# Patient Record
Sex: Female | Born: 1955 | Race: Black or African American | Hispanic: No | Smoking: Never smoker
Health system: Southern US, Community
[De-identification: ages and names within clinical notes are randomized; demographics above are authoritative.]

## PROBLEM LIST (undated history)

## (undated) DIAGNOSIS — R7303 Prediabetes: Secondary | ICD-10-CM

## (undated) DIAGNOSIS — I5032 Chronic diastolic (congestive) heart failure: Secondary | ICD-10-CM

## (undated) DIAGNOSIS — H8110 Benign paroxysmal vertigo, unspecified ear: Secondary | ICD-10-CM

## (undated) DIAGNOSIS — F419 Anxiety disorder, unspecified: Secondary | ICD-10-CM

## (undated) DIAGNOSIS — K589 Irritable bowel syndrome without diarrhea: Secondary | ICD-10-CM

## (undated) DIAGNOSIS — F32A Depression, unspecified: Secondary | ICD-10-CM

## (undated) DIAGNOSIS — E559 Vitamin D deficiency, unspecified: Secondary | ICD-10-CM

## (undated) DIAGNOSIS — I480 Paroxysmal atrial fibrillation: Secondary | ICD-10-CM

## (undated) DIAGNOSIS — F329 Major depressive disorder, single episode, unspecified: Secondary | ICD-10-CM

## (undated) DIAGNOSIS — E119 Type 2 diabetes mellitus without complications: Secondary | ICD-10-CM

## (undated) DIAGNOSIS — T8859XA Other complications of anesthesia, initial encounter: Secondary | ICD-10-CM

## (undated) DIAGNOSIS — M1990 Unspecified osteoarthritis, unspecified site: Secondary | ICD-10-CM

## (undated) DIAGNOSIS — Z7901 Long term (current) use of anticoagulants: Secondary | ICD-10-CM

## (undated) DIAGNOSIS — I10 Essential (primary) hypertension: Secondary | ICD-10-CM

## (undated) DIAGNOSIS — I639 Cerebral infarction, unspecified: Secondary | ICD-10-CM

## (undated) DIAGNOSIS — E669 Obesity, unspecified: Secondary | ICD-10-CM

## (undated) DIAGNOSIS — I4891 Unspecified atrial fibrillation: Secondary | ICD-10-CM

## (undated) DIAGNOSIS — E739 Lactose intolerance, unspecified: Secondary | ICD-10-CM

## (undated) DIAGNOSIS — R42 Dizziness and giddiness: Secondary | ICD-10-CM

## (undated) DIAGNOSIS — G4730 Sleep apnea, unspecified: Secondary | ICD-10-CM

## (undated) DIAGNOSIS — J329 Chronic sinusitis, unspecified: Secondary | ICD-10-CM

## (undated) DIAGNOSIS — M549 Dorsalgia, unspecified: Secondary | ICD-10-CM

## (undated) DIAGNOSIS — I509 Heart failure, unspecified: Secondary | ICD-10-CM

## (undated) DIAGNOSIS — R600 Localized edema: Secondary | ICD-10-CM

## (undated) DIAGNOSIS — K219 Gastro-esophageal reflux disease without esophagitis: Secondary | ICD-10-CM

## (undated) DIAGNOSIS — Z6841 Body Mass Index (BMI) 40.0 and over, adult: Secondary | ICD-10-CM

## (undated) HISTORY — DX: Heart failure, unspecified: I50.9

## (undated) HISTORY — PX: GASTRIC BYPASS: SHX52

## (undated) HISTORY — DX: Morbid (severe) obesity due to excess calories: E66.01

## (undated) HISTORY — PX: TOTAL KNEE ARTHROPLASTY: SHX125

## (undated) HISTORY — DX: Chronic sinusitis, unspecified: J32.9

## (undated) HISTORY — DX: Unspecified osteoarthritis, unspecified site: M19.90

## (undated) HISTORY — DX: Cerebral infarction, unspecified: I63.9

## (undated) HISTORY — DX: Sleep apnea, unspecified: G47.30

## (undated) HISTORY — DX: Gastro-esophageal reflux disease without esophagitis: K21.9

## (undated) HISTORY — DX: Vitamin D deficiency, unspecified: E55.9

## (undated) HISTORY — DX: Obesity, unspecified: E66.9

## (undated) HISTORY — DX: Localized edema: R60.0

## (undated) HISTORY — PX: ABDOMINAL HYSTERECTOMY: SHX81

## (undated) HISTORY — DX: Depression, unspecified: F32.A

## (undated) HISTORY — DX: Lactose intolerance, unspecified: E73.9

## (undated) HISTORY — DX: Essential (primary) hypertension: I10

## (undated) HISTORY — DX: Irritable bowel syndrome, unspecified: K58.9

## (undated) HISTORY — PX: BREAST EXCISIONAL BIOPSY: SUR124

## (undated) HISTORY — PX: ENDOMETRIAL ABLATION: SHX621

## (undated) HISTORY — PX: OTHER SURGICAL HISTORY: SHX169

## (undated) HISTORY — DX: Major depressive disorder, single episode, unspecified: F32.9

## (undated) HISTORY — DX: Dorsalgia, unspecified: M54.9

## (undated) HISTORY — DX: Body Mass Index (BMI) 40.0 and over, adult: Z684

## (undated) HISTORY — DX: Prediabetes: R73.03

## (undated) HISTORY — DX: Dizziness and giddiness: R42

---

## 2004-03-27 ENCOUNTER — Ambulatory Visit: Payer: Self-pay | Admitting: Family Medicine

## 2005-05-26 ENCOUNTER — Observation Stay (HOSPITAL_COMMUNITY): Admission: RE | Admit: 2005-05-26 | Discharge: 2005-05-27 | Payer: Self-pay | Admitting: Gynecology

## 2005-05-26 ENCOUNTER — Encounter (INDEPENDENT_AMBULATORY_CARE_PROVIDER_SITE_OTHER): Payer: Self-pay | Admitting: Specialist

## 2005-10-09 ENCOUNTER — Ambulatory Visit: Payer: Self-pay | Admitting: Orthopaedic Surgery

## 2005-10-27 ENCOUNTER — Ambulatory Visit: Payer: Self-pay | Admitting: Gynecology

## 2007-01-25 ENCOUNTER — Ambulatory Visit: Payer: Self-pay | Admitting: Gynecology

## 2007-02-24 ENCOUNTER — Encounter: Admission: RE | Admit: 2007-02-24 | Discharge: 2007-02-24 | Payer: Self-pay | Admitting: Gynecology

## 2007-03-04 ENCOUNTER — Encounter: Admission: RE | Admit: 2007-03-04 | Discharge: 2007-03-04 | Payer: Self-pay | Admitting: Gynecology

## 2007-09-16 ENCOUNTER — Encounter: Admission: RE | Admit: 2007-09-16 | Discharge: 2007-09-16 | Payer: Self-pay | Admitting: Gynecology

## 2008-03-01 ENCOUNTER — Encounter: Admission: RE | Admit: 2008-03-01 | Discharge: 2008-03-01 | Payer: Self-pay | Admitting: Obstetrics and Gynecology

## 2008-06-26 ENCOUNTER — Ambulatory Visit: Payer: Self-pay | Admitting: Family Medicine

## 2008-07-17 ENCOUNTER — Encounter: Payer: Self-pay | Admitting: Family Medicine

## 2008-07-20 ENCOUNTER — Encounter: Payer: Self-pay | Admitting: Family Medicine

## 2008-08-19 ENCOUNTER — Encounter: Payer: Self-pay | Admitting: Family Medicine

## 2008-09-19 ENCOUNTER — Encounter: Payer: Self-pay | Admitting: Family Medicine

## 2009-01-20 ENCOUNTER — Ambulatory Visit: Payer: Self-pay | Admitting: Family Medicine

## 2009-04-06 ENCOUNTER — Encounter: Admission: RE | Admit: 2009-04-06 | Discharge: 2009-04-06 | Payer: Self-pay | Admitting: Family Medicine

## 2010-04-12 ENCOUNTER — Encounter
Admission: RE | Admit: 2010-04-12 | Discharge: 2010-04-12 | Payer: Self-pay | Source: Home / Self Care | Attending: Family Medicine | Admitting: Family Medicine

## 2010-05-12 ENCOUNTER — Encounter: Payer: Self-pay | Admitting: Gynecology

## 2010-09-06 NOTE — H&P (Signed)
NAME:  Regina Christensen, Regina Christensen NO.:  [id number]   MEDICAL RECORD NO.:  [id number]          PATIENT TYPE:  INP   LOCATION:  NA                            FACILITY:  [facility]   PHYSICIAN:  Ginger Carne, MD  DATE OF BIRTH:  Jun 13, 1955   DATE OF ADMISSION:  DATE OF DISCHARGE:                                HISTORY & PHYSICAL   DATE OF SURGERY:  Scheduled for Monday, May 26, 2005.   REASON FOR HOSPITALIZATION:  Menometrorrhagia.   IN HOSPITAL PROCEDURES:  A total vaginal hysterectomy with preservation of  both tubes and ovaries.   HISTORY OF PRESENT ILLNESS:  This patient is a 55 year old gravida 2, para 2-  0-0-2, African-American female admitted for aforementioned diagnoses because  of a long-standing history of over the past five years of menses lasting  between 10 and 15 days out of the 30 day cycle.  She had undergone in the  end of 2002 an endomyometrial resection followed six months later by an  endometrial thermal balloon ablation for a conservative management of said  bleeding.  At that time, both procedures did not sufficiently curb her  bleeding and spotting throughout the month.  The pathology reports on both  cases revealed no evidence of neoplasia or hyperplasia.  A transvaginal  ultrasound obtained in the office with an SIS revealed no evidence  intracavitary lesions.  Myometrium revealed no evidence of leiomyoma.  Due  to the patient's hypertensive state, she is not a candidate for oral  contraceptive management.   OBSTETRICAL/GYNECOLOGICAL HISTORY:  The patient has had two vaginal  deliveries in the past.  Method of contraception, a bilateral tubal  ligation.  Endomyometrial resection in December of 2002 and endometrial  thermal balloon ablation in May of 2003.   MEDICAL HISTORY:  1.  Hypertension.  2.  Depression.   SURGICAL HISTORY:  Per obstetrical/gynecological section.   MEDICATIONS:  1.  Lotrel 10/20 mg daily.  2.  Effexor 150 mg daily.  3.  Allegra 120 mg as needed twice a day.  4.  Naprosyn 500 mg as needed daily.  5.  Triamterene/hydrochlorothiazide 75/50 mg one daily.  6.  Lorazepam 0.5 mg as needed up to three times a day.   ALLERGIES:  NONE.   SOCIAL HISTORY:  The patient denies smoking, drug or alcohol abuse.   FAMILY HISTORY:  Noncontributory.   REVIEW OF SYSTEMS:  A 10-point comprehensive review of systems is  unremarkable.   PHYSICAL EXAMINATION:  VITAL SIGNS:  Height 5 feet 8 inches, weight 263  pounds, pulse 98 and regular, blood pressure is 149/92.  HEENT:  Grossly normal.  BREAST EXAM:  Without mass or discharge, thickenings or tenderness.  CHEST:  Clear to percussion and auscultation.  CARDIOVASCULAR EXAM:  Without murmurs or enlargements, regular rate and  rhythm.  VASCULAR, EXTREMITIES, LYMPHATICS, SKIN, NEUROLOGICAL SYSTEMS AND  MUSCULOSKELETAL SYSTEMS:  Normal.  ABDOMEN:  Soft without gross hepatosplenomegaly.  PELVIC EXAM:  External genitalia, vulva and vagina normal.  The cervix is  smooth without erosions or lesions.  Normal Pap smear.  The uterus is six  weeks  in size and globular.  Both adnexa are palpable and found to be  normal.  RECTAL EXAM:  Hemoccult negative without masses.   IMPRESSION:  Menometrorrhagia with failure of two conservative procedures  for control of bleeding.   PLAN:  The patient has no desire for further childbearing and considers said  conservative management unsuccessful.  The patient desires to have no  further bleeding.  She is not a candidate for hormonal management due to her  hypertensive status and being over 35.  The patient has agreed and will  undergo a total vaginal hysterectomy with preservation of both tubes and  ovaries.  Ashby Dawes of said procedure discussed in detail.  The risk including  possible injuries to ureter, bowel and bladder, possible conversion to  laparoscopic or open procedure, hemorrhage possibly requiring a blood  transfusion,  infection, pulmonary complications, and other unforeseen  complications discussed and understood by said patient.  The patient will  undergo said procedure on May 26, 2005.      Ginger Carne, MD  Electronically Signed     SHB/MEDQ  D:  05/22/2005  T:  05/22/2005  Job:  [id number]

## 2010-09-06 NOTE — Discharge Summary (Signed)
NAME:  Regina Christensen, PEPPEL NO.:  [id number]   MEDICAL RECORD NO.:  [id number]          PATIENT TYPE:  INP   LOCATION:  9310                          [facility]ACILITY:  [facility]   PHYSICIAN:  Ginger Carne, MD  DATE OF BIRTH:  December 25, 1955   DATE OF ADMISSION:  05/26/2005  DATE OF DISCHARGE:  05/27/2005                                 DISCHARGE SUMMARY   REASON FOR HOSPITALIZATION:  Menometrorrhagia, leiomyomatous uterus.   IN-HOSPITAL PROCEDURES:  Total vaginal hysterectomy.   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   HOSPITAL COURSE:  This patient is a 54 year old African-American female who  underwent a total vaginal hysterectomy with preservation of both tubes and  ovaries on May 26, 2005.  Intraoperative course was uneventful.  Her  uterus weighed 368 g and was 14 weeks in size with multiple leiomyoma, the  largest being 4 cm.  Postoperatively she did well.  She was afebrile, voided  well.  H&H 35.5 and 11.9.  She had scant vaginal flow.  Abdomen soft,  minimal tenderness.  Calves without tenderness.  Lungs were clear.   Patient was discharged with routine postoperative instructions including  contacting the office for temperature elevation above 100.4 degrees  Fahrenheit, increasing abdominal pain, increased vaginal flow, genitourinary  or gastrointestinal symptoms.  Patient was prescribed Percocet 5/325 one to  two every four to six hours.  She will return to the office in two weeks.      Ginger Carne, MD  Electronically Signed     SHB/MEDQ  D:  05/27/2005  T:  05/27/2005  Job:  [id number]

## 2010-09-06 NOTE — Op Note (Signed)
NAME:  RHILEY, TARVER NO.:  [id number]   MEDICAL RECORD NO.:  [id number]          PATIENT TYPE:  INP   LOCATION:  9399                          [facility]ILITY:  [facility]   PHYSICIAN:  Ginger Carne, MD  DATE OF BIRTH:  02/07/1956   DATE OF PROCEDURE:  05/26/2005  DATE OF DISCHARGE:                                 OPERATIVE REPORT   PREOPERATIVE DIAGNOSIS:  Menometrorrhagia, leiomyomatous uterus.   POSTOPERATIVE DIAGNOSIS:  Menometrorrhagia, leiomyomatous uterus.  14-week  leiomyomatous uterus.   PROCEDURE:  Total vaginal hysterectomy with preservation of both tubes and  ovaries.   SURGEON:  Ginger Carne, M.D.   ASSISTANT:  None.   COMPLICATIONS:  None immediate.   ESTIMATED BLOOD LOSS:  125 mL.   SPECIMEN:  Uterus weighing 268 grams.   ANESTHESIA:  General.   OPERATIVE FINDINGS:  External genitalia, vulva, vagina normal. Cervix smooth  without erosions or lesions. Evaluation of the uterus revealed multiple  leiomyoma with maximum dimension of 4-5 cm, measuring through 268 grams and  about 14 weeks size. Both ovaries appeared normal.   OPERATIVE PROCEDURE:  The patient prepped and draped in usual fashion and  placed in lithotomy position. Betadine solution used for antiseptic. The  patient was catheterized prior to the procedure. After adequate general  anesthesia double tooth tenaculum placed on the anterior and posterior lips  of the cervix. Marcaine with epinephrine was injected as a paracervical  block around the cervix.  2 cm of anterior and posterior vaginal epithelium  were incised transversely. This was then followed by dissection of the  peritoneal reflections and opening said reflections without injury to the  respective organs. Following this uterosacral cardinal ligament complexes  were clamped, cut and ligated with 0 Vicryl suture. This extended to the  uterine vasculature, ascending branches and broad ligaments. At this point  the  utero-ovarian ligaments were clamped, cut and ligated with transfixation  0 Vicryl sutures twice.  Bleeding points hemostatically checked. Blood clots removed. Closure of the  cuff in one layer with 0 Vicryl running interlocking suture. The patient  tolerated the procedure well, returned to post anesthesia recovery room in  excellent condition.      Ginger Carne, MD  Electronically Signed     SHB/MEDQ  D:  05/26/2005  T:  05/27/2005  Job:  [id number]

## 2010-10-09 ENCOUNTER — Ambulatory Visit: Payer: Self-pay | Admitting: Family Medicine

## 2011-03-14 ENCOUNTER — Other Ambulatory Visit: Payer: Self-pay | Admitting: Family Medicine

## 2011-03-14 ENCOUNTER — Other Ambulatory Visit: Payer: Self-pay | Admitting: *Deleted

## 2011-03-14 DIAGNOSIS — Z1231 Encounter for screening mammogram for malignant neoplasm of breast: Secondary | ICD-10-CM

## 2011-04-08 ENCOUNTER — Ambulatory Visit: Payer: Self-pay | Admitting: Unknown Physician Specialty

## 2011-04-18 ENCOUNTER — Ambulatory Visit: Payer: Self-pay

## 2011-05-09 ENCOUNTER — Other Ambulatory Visit: Payer: Self-pay | Admitting: Family Medicine

## 2011-05-09 ENCOUNTER — Ambulatory Visit
Admission: RE | Admit: 2011-05-09 | Discharge: 2011-05-09 | Disposition: A | Discharge status: Home / Self Care | Payer: Self-pay | Source: Ambulatory Visit | Attending: *Deleted | Admitting: *Deleted

## 2011-05-09 ENCOUNTER — Other Ambulatory Visit: Payer: Self-pay | Admitting: *Deleted

## 2011-05-09 DIAGNOSIS — Z1231 Encounter for screening mammogram for malignant neoplasm of breast: Secondary | ICD-10-CM

## 2011-11-14 ENCOUNTER — Ambulatory Visit (INDEPENDENT_AMBULATORY_CARE_PROVIDER_SITE_OTHER): Payer: BC Managed Care – PPO | Admitting: Obstetrics & Gynecology

## 2011-11-14 ENCOUNTER — Encounter: Payer: Self-pay | Admitting: Obstetrics & Gynecology

## 2011-11-14 VITALS — BP 126/103 | HR 88 | Ht 68.0 in | Wt 309.0 lb

## 2011-11-14 DIAGNOSIS — Z0000 Encounter for general adult medical examination without abnormal findings: Secondary | ICD-10-CM
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
## 2011-11-14 NOTE — Progress Notes (Signed)
Yearly exam/  Going through the process to have gastric sleeve surgery.

## 2011-11-21 ENCOUNTER — Ambulatory Visit: Payer: Self-pay | Admitting: Bariatrics

## 2011-11-26 ENCOUNTER — Ambulatory Visit: Payer: Self-pay | Admitting: Bariatrics

## 2011-12-11 ENCOUNTER — Ambulatory Visit: Payer: Self-pay | Admitting: Bariatrics

## 2011-12-20 ENCOUNTER — Emergency Department: Payer: Self-pay | Admitting: *Deleted

## 2011-12-21 ENCOUNTER — Ambulatory Visit: Payer: Self-pay | Admitting: Bariatrics

## 2012-01-06 ENCOUNTER — Ambulatory Visit (INDEPENDENT_AMBULATORY_CARE_PROVIDER_SITE_OTHER): Payer: BC Managed Care – PPO | Admitting: Nurse Practitioner

## 2012-01-06 ENCOUNTER — Encounter: Payer: Self-pay | Admitting: Nurse Practitioner

## 2012-01-06 VITALS — BP 137/83 | HR 80 | Ht 68.0 in | Wt 285.0 lb

## 2012-01-06 DIAGNOSIS — N63 Unspecified lump in unspecified breast: Secondary | ICD-10-CM

## 2012-01-06 DIAGNOSIS — N6320 Unspecified lump in the left breast, unspecified quadrant: Secondary | ICD-10-CM

## 2012-01-06 NOTE — Patient Instructions (Signed)
Breast Problems and Self-Exam Completing monthly breast exams may pick up problems early and save lives. There can be numerous causes of swelling, tenderness or lumps in the breasts. Some of these causes are:   Fibrocystic breast syndrome (noncancerous lumps). This is the most common cause of lumps in the breast.   Fibroadenoma breast tumors of unknown cause. These are noncancerous (benign) lumps.   Benign fatty tumors (lipomas).   Cancer of the breast.  By doing monthly breast exams, you get to know how your breasts feel and how they can change from month to month. This allows you to notice changes early. It can also offer you some reassurance that your breast health is good.  BREAST SELF-EXAM There are a few points to follow when doing a thorough breast exam. The best time to examine your breasts is 5 to 7 days after your menstrual period is over. During menstruation, the breasts are lumpier, and it may be more difficult to pick up changes. If you do not menstruate, have reached menopause, or had a hysterectomy (uterus removal), examine your breasts the first day of every month. After 3 to 4 months, you will become more familiar with the variations of your breasts and more comfortable with the exam.  Perform your breast exam monthly. Keep a written record with breast changes or normal findings for each breast. This makes it easier to be sure of changes, so you do not need to depend only on memory for size, tenderness, or location. Try to do the exam at the same time each month, and write down where you are in your menstrual cycle, if you are still menstruating.   Look at your breasts. Stand in front of a mirror with your hands clasped behind your head. Tighten your chest muscles and look for asymmetry. This means a difference in shape or contour from one breast to the other, such as puckers, dips or bumps. Also, look for skin changes.   Lean forward with your hands on your hips. Again, look for  symmetry and skin changes.   While showering, soap the breasts. Then, carefully feel the breasts with your fingertips, while holding the other arm (on the side of the breast you are examining) over your head. Do this with each breast, carefully feeling for lumps or changes. Typically, a circular motion with moderate fingertip pressure should be used.   Repeat this exam while lying on your back. Put your arm behind your head and a pillow under your shoulders. Again, use your fingertips to examine both breasts, feeling for lumps and thickening. Begin at the top of your breast, and go clockwise around the whole breast.   At the end of your exam, gently squeeze each nipple to see if there is any drainage of fluids. Look for nipple changes, dimpling, or redness.   Lastly, examine the upper chest and collarbone (clavicle) areas, and in your armpits.  It is not necessary to be alarmed if you find a breast lump. Most of them are not cancerous. However, it is necessary to see your caregiver if a lump is found, in order to have it looked at. Document Released: 04/07/2005 Document Revised: 03/27/2011 Document Reviewed: 07/25/2008 [facility] Patient Information 2012 [place], [place].
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
## 2012-01-12 ENCOUNTER — Ambulatory Visit
Admission: RE | Admit: 2012-01-12 | Discharge: 2012-01-12 | Disposition: A | Discharge status: Home / Self Care | Payer: BC Managed Care – PPO | Source: Ambulatory Visit | Attending: Nurse Practitioner | Admitting: Nurse Practitioner

## 2012-01-12 ENCOUNTER — Other Ambulatory Visit: Payer: Self-pay | Admitting: Nurse Practitioner

## 2012-01-12 DIAGNOSIS — N6320 Unspecified lump in the left breast, unspecified quadrant: Secondary | ICD-10-CM

## 2012-01-21 ENCOUNTER — Ambulatory Visit: Payer: Self-pay | Admitting: Bariatrics

## 2012-02-20 ENCOUNTER — Ambulatory Visit: Payer: Self-pay | Admitting: Bariatrics

## 2012-04-09 ENCOUNTER — Other Ambulatory Visit: Payer: Self-pay | Admitting: Obstetrics & Gynecology

## 2012-04-09 DIAGNOSIS — Z1231 Encounter for screening mammogram for malignant neoplasm of breast: Secondary | ICD-10-CM

## 2012-05-06 ENCOUNTER — Telehealth: Payer: Self-pay | Admitting: *Deleted

## 2012-05-06 NOTE — Telephone Encounter (Signed)
Error/mother was calling for her daughter Regina Christensen.

## 2012-05-11 ENCOUNTER — Ambulatory Visit: Payer: BC Managed Care – PPO

## 2012-05-14 ENCOUNTER — Ambulatory Visit
Admission: RE | Admit: 2012-05-14 | Discharge: 2012-05-14 | Disposition: A | Discharge status: Home / Self Care | Payer: BC Managed Care – PPO | Source: Ambulatory Visit | Attending: Obstetrics & Gynecology | Admitting: Obstetrics & Gynecology

## 2012-05-14 DIAGNOSIS — Z1231 Encounter for screening mammogram for malignant neoplasm of breast: Secondary | ICD-10-CM

## 2012-05-17 ENCOUNTER — Other Ambulatory Visit: Payer: Self-pay | Admitting: Obstetrics & Gynecology

## 2012-05-17 DIAGNOSIS — R928 Other abnormal and inconclusive findings on diagnostic imaging of breast: Secondary | ICD-10-CM

## 2012-05-28 ENCOUNTER — Ambulatory Visit
Admission: RE | Admit: 2012-05-28 | Discharge: 2012-05-28 | Disposition: A | Discharge status: Home / Self Care | Payer: BC Managed Care – PPO | Source: Ambulatory Visit | Attending: Obstetrics & Gynecology | Admitting: Obstetrics & Gynecology

## 2012-05-28 DIAGNOSIS — R928 Other abnormal and inconclusive findings on diagnostic imaging of breast: Secondary | ICD-10-CM

## 2012-07-10 ENCOUNTER — Emergency Department: Payer: Self-pay | Admitting: Emergency Medicine

## 2012-07-10 LAB — COMPREHENSIVE METABOLIC PANEL
Albumin: 3.7 g/dL (ref 3.4–5.0)
Alkaline Phosphatase: 111 U/L (ref 50–136)
Anion Gap: 7 (ref 7–16)
BUN: 16 mg/dL (ref 7–18)
Bilirubin,Total: 0.3 mg/dL (ref 0.2–1.0)
Calcium, Total: 9.1 mg/dL (ref 8.5–10.1)
Chloride: 104 mmol/L (ref 98–107)
Co2: 29 mmol/L (ref 21–32)
Creatinine: 0.8 mg/dL (ref 0.60–1.30)
EGFR (African American): >60
EGFR (Non-African Amer.): >60
Glucose: 142 mg/dL — ABNORMAL HIGH (ref 65–99)
Osmolality: 283 (ref 275–301)
Potassium: 3.5 mmol/L (ref 3.5–5.1)
SGOT(AST): 25 U/L (ref 15–37)
SGPT (ALT): 21 U/L (ref 12–78)
Sodium: 140 mmol/L (ref 136–145)
Total Protein: 8 g/dL (ref 6.4–8.2)

## 2012-07-10 LAB — CBC
HCT: 43.4 % (ref 35.0–47.0)
HGB: 14.4 g/dL (ref 12.0–16.0)
MCH: 29.3 pg (ref 26.0–34.0)
MCHC: 33.1 g/dL (ref 32.0–36.0)
MCV: 89 fL (ref 80–100)
Platelet: 268 10*3/uL (ref 150–440)
RBC: 4.9 10*6/uL (ref 3.80–5.20)
RDW: 15 % — ABNORMAL HIGH (ref 11.5–14.5)
WBC: 11.7 10*3/uL — ABNORMAL HIGH (ref 3.6–11.0)

## 2012-07-10 LAB — TROPONIN I: Troponin-I: <0.02 ng/mL

## 2012-07-10 LAB — LIPASE, BLOOD: Lipase: 145 U/L (ref 73–393)

## 2012-07-11 LAB — URINALYSIS, COMPLETE
Bacteria: NONE SEEN
Bilirubin,UR: NEGATIVE
Glucose,UR: NEGATIVE mg/dL (ref 0–75)
Ketone: NEGATIVE
Leukocyte Esterase: NEGATIVE
Nitrite: NEGATIVE
Ph: 6 (ref 4.5–8.0)
Protein: NEGATIVE
RBC,UR: 8 /HPF (ref 0–5)
Specific Gravity: 1.027 (ref 1.003–1.030)
Squamous Epithelial: 3
WBC UR: 2 /HPF (ref 0–5)

## 2012-07-11 LAB — RAPID INFLUENZA A&B ANTIGENS

## 2012-07-12 LAB — URINE CULTURE

## 2012-07-17 LAB — CULTURE, BLOOD (SINGLE)

## 2012-12-03 ENCOUNTER — Other Ambulatory Visit: Payer: Self-pay | Admitting: Bariatrics

## 2012-12-03 DIAGNOSIS — IMO0001 Reserved for inherently not codable concepts without codable children: Secondary | ICD-10-CM

## 2012-12-03 DIAGNOSIS — E669 Obesity, unspecified: Secondary | ICD-10-CM

## 2012-12-10 ENCOUNTER — Ambulatory Visit
Admission: RE | Admit: 2012-12-10 | Discharge: 2012-12-10 | Disposition: A | Discharge status: Home / Self Care | Payer: BC Managed Care – PPO | Source: Ambulatory Visit | Attending: Bariatrics | Admitting: Bariatrics

## 2012-12-10 DIAGNOSIS — IMO0001 Reserved for inherently not codable concepts without codable children: Secondary | ICD-10-CM

## 2012-12-10 DIAGNOSIS — E669 Obesity, unspecified: Secondary | ICD-10-CM

## 2013-06-14 ENCOUNTER — Other Ambulatory Visit: Payer: Self-pay

## 2013-06-14 DIAGNOSIS — Z1231 Encounter for screening mammogram for malignant neoplasm of breast: Secondary | ICD-10-CM

## 2013-06-24 ENCOUNTER — Ambulatory Visit
Admission: RE | Admit: 2013-06-24 | Discharge: 2013-06-24 | Disposition: A | Discharge status: Home / Self Care | Payer: BC Managed Care – PPO | Source: Ambulatory Visit

## 2013-06-24 DIAGNOSIS — Z1231 Encounter for screening mammogram for malignant neoplasm of breast: Secondary | ICD-10-CM

## 2014-06-02 ENCOUNTER — Ambulatory Visit: Payer: Self-pay | Admitting: Gastroenterology

## 2014-08-01 ENCOUNTER — Other Ambulatory Visit: Payer: Self-pay

## 2014-08-01 DIAGNOSIS — Z1231 Encounter for screening mammogram for malignant neoplasm of breast: Secondary | ICD-10-CM

## 2014-08-11 ENCOUNTER — Ambulatory Visit: Payer: BC Managed Care – PPO

## 2014-08-18 ENCOUNTER — Ambulatory Visit
Admission: RE | Admit: 2014-08-18 | Discharge: 2014-08-18 | Disposition: A | Discharge status: Home / Self Care | Payer: BC Managed Care – PPO | Source: Ambulatory Visit

## 2014-08-18 DIAGNOSIS — Z1231 Encounter for screening mammogram for malignant neoplasm of breast: Secondary | ICD-10-CM

## 2014-10-22 ENCOUNTER — Other Ambulatory Visit: Payer: Self-pay | Admitting: Family Medicine

## 2014-11-09 ENCOUNTER — Encounter: Payer: Self-pay | Admitting: Family Medicine

## 2014-11-09 ENCOUNTER — Ambulatory Visit (INDEPENDENT_AMBULATORY_CARE_PROVIDER_SITE_OTHER): Payer: BC Managed Care – PPO | Admitting: Family Medicine

## 2014-11-09 VITALS — BP 130/88 | HR 100 | Temp 98.9°F | Resp 18 | Ht 67.0 in | Wt 284.1 lb

## 2014-11-09 DIAGNOSIS — E785 Hyperlipidemia, unspecified: Secondary | ICD-10-CM

## 2014-11-09 DIAGNOSIS — E669 Obesity, unspecified: Secondary | ICD-10-CM

## 2014-11-09 DIAGNOSIS — F411 Generalized anxiety disorder: Secondary | ICD-10-CM | POA: Insufficient documentation

## 2014-11-09 DIAGNOSIS — F419 Anxiety disorder, unspecified: Secondary | ICD-10-CM | POA: Diagnosis not present

## 2014-11-09 DIAGNOSIS — E1159 Type 2 diabetes mellitus with other circulatory complications: Secondary | ICD-10-CM | POA: Insufficient documentation

## 2014-11-09 DIAGNOSIS — R739 Hyperglycemia, unspecified: Secondary | ICD-10-CM | POA: Insufficient documentation

## 2014-11-09 DIAGNOSIS — I10 Essential (primary) hypertension: Secondary | ICD-10-CM | POA: Diagnosis not present

## 2014-11-09 DIAGNOSIS — L83 Acanthosis nigricans: Secondary | ICD-10-CM | POA: Insufficient documentation

## 2014-11-09 LAB — GLUCOSE, POCT (MANUAL RESULT ENTRY): POC Glucose: 85 mg/dl (ref 70–99)

## 2014-11-09 LAB — POCT GLYCOSYLATED HEMOGLOBIN (HGB A1C): Hemoglobin A1C: 6.1

## 2014-11-09 MED ORDER — GLUCOSE BLOOD VI STRP: ORAL_STRIP | Status: DC

## 2014-11-09 MED ORDER — ALPRAZOLAM 0.5 MG PO TABS: 0.5000 mg | ORAL_TABLET | Freq: Two times a day (BID) | ORAL | Status: DC | PRN
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
## 2014-11-09 NOTE — Patient Instructions (Signed)
Delsym over-the-counter 2 teaspoons twice a day for cough  Lac-Hydrin with Eucerin cream OTC for rash on the faceObesity Obesity is defined as having too much total body fat and a body mass index (BMI) of 30 or more. BMI is an estimate of body fat and is calculated from your height and weight. Obesity happens when you consume more calories than you can burn by exercising or performing daily physical tasks. Prolonged obesity can cause major illnesses or emergencies, such as:   Stroke.  Heart disease.  Diabetes.  Cancer.  Arthritis.  High blood pressure (hypertension).  High cholesterol.  Sleep apnea.  Erectile dysfunction.  Infertility problems. CAUSES   Regularly eating unhealthy foods.  Physical inactivity.  Certain disorders, such as an underactive thyroid (hypothyroidism), Cushing's syndrome, and polycystic ovarian syndrome.  Certain medicines, such as steroids, some depression medicines, and antipsychotics.  Genetics.  Lack of sleep. DIAGNOSIS  A health care provider can diagnose obesity after calculating your BMI. Obesity will be diagnosed if your BMI is 30 or higher.  There are other methods of measuring obesity levels. Some other methods include measuring your skinfold thickness, your waist circumference, and comparing your hip circumference to your waist circumference. TREATMENT  A healthy treatment program includes some or all of the following:  Long-term dietary changes.  Exercise and physical activity.  Behavioral and lifestyle changes.  Medicine only under the supervision of your health care provider. Medicines may help, but only if they are used with diet and exercise programs. An unhealthy treatment program includes:  Fasting.  Fad diets.  Supplements and drugs. These choices do not succeed in long-term weight control.  HOME CARE INSTRUCTIONS   Exercise and perform physical activity as directed by your health care provider. To increase  physical activity, try the following:  Use stairs instead of elevators.  Park farther away from store entrances.  Garden, bike, or walk instead of watching television or using the computer.  Eat healthy, low-calorie foods and drinks on a regular basis. Eat more fruits and vegetables. Use low-calorie cookbooks or take healthy cooking classes.  Limit fast food, sweets, and processed snack foods.  Eat smaller portions.  Keep a daily journal of everything you eat. There are many free websites to help you with this. It may be helpful to measure your foods so you can determine if you are eating the correct portion sizes.  Avoid drinking alcohol. Drink more water and drinks without calories.  Take vitamins and supplements only as recommended by your health care provider.  Weight-loss support groups, Government social research officer, counselors, and stress reduction education can also be very helpful. SEEK IMMEDIATE MEDICAL CARE IF:  You have chest pain or tightness.  You have trouble breathing or feel short of breath.  You have weakness or leg numbness.  You feel confused or have trouble talking.  You have sudden changes in your vision. MAKE SURE YOU:  Understand these instructions.  Will watch your condition.  Will get help right away if you are not doing well or get worse. Document Released: 05/15/2004 Document Revised: 08/22/2013 Document Reviewed: 05/14/2011 [facility] Patient Information 2015 [place], [place]. This information is not intended to replace advice given to you by your health care provider. Make sure you discuss any questions you have with your health care provider.

## 2014-11-10 ENCOUNTER — Telehealth: Payer: Self-pay | Admitting: Family Medicine

## 2014-11-10 DIAGNOSIS — E785 Hyperlipidemia, unspecified: Secondary | ICD-10-CM | POA: Insufficient documentation

## 2014-11-10 NOTE — Telephone Encounter (Signed)
Patient forgot to inform you about motion sickness. She gets an instant headache when in motion (for instance when she is in the car and someone put on breaks suddenly or when there is quick movement). States it has gotten worse over the past yr. Please advise.

## 2014-11-13 NOTE — Telephone Encounter (Signed)
Meclizine 25 mg  q hours prn , #30/1

## 2014-11-14 MED ORDER — MECLIZINE HCL 25 MG PO TABS: 25.0000 mg | ORAL_TABLET | Freq: Three times a day (TID) | ORAL | Status: DC | PRN

## 2014-11-14 NOTE — Telephone Encounter (Signed)
Rx sent to pharmacy and pt notified  ?

## 2014-12-06 LAB — LIPID PANEL
Chol/HDL Ratio: 2 ratio units (ref 0.0–4.4)
Cholesterol, Total: 151 mg/dL (ref 100–199)
HDL: 74 mg/dL (ref >39)
LDL Calculated: 55 mg/dL (ref 0–99)
Triglycerides: 109 mg/dL (ref 0–149)
VLDL Cholesterol Cal: 22 mg/dL (ref 5–40)

## 2014-12-06 LAB — COMPREHENSIVE METABOLIC PANEL
ALT: 9 IU/L (ref 0–32)
AST: 12 IU/L (ref 0–40)
Albumin/Globulin Ratio: 1.6 (ref 1.1–2.5)
Albumin: 4.2 g/dL (ref 3.5–5.5)
Alkaline Phosphatase: 93 IU/L (ref 39–117)
BUN/Creatinine Ratio: 15 (ref 9–23)
BUN: 12 mg/dL (ref 6–24)
Bilirubin Total: 0.3 mg/dL (ref 0.0–1.2)
CO2: 26 mmol/L (ref 18–29)
Calcium: 9.5 mg/dL (ref 8.7–10.2)
Chloride: 99 mmol/L (ref 97–108)
Creatinine, Ser: 0.79 mg/dL (ref 0.57–1.00)
GFR calc Af Amer: 95 mL/min/{1.73_m2} (ref >59)
GFR calc non Af Amer: 83 mL/min/{1.73_m2} (ref >59)
Globulin, Total: 2.6 g/dL (ref 1.5–4.5)
Glucose: 104 mg/dL — ABNORMAL HIGH (ref 65–99)
Potassium: 3.4 mmol/L — ABNORMAL LOW (ref 3.5–5.2)
Sodium: 142 mmol/L (ref 134–144)
Total Protein: 6.8 g/dL (ref 6.0–8.5)

## 2014-12-06 LAB — TSH: TSH: 0.807 u[IU]/mL (ref 0.450–4.500)

## 2014-12-29 ENCOUNTER — Other Ambulatory Visit: Payer: Self-pay | Admitting: Family Medicine

## 2015-01-08 ENCOUNTER — Other Ambulatory Visit: Payer: Self-pay | Admitting: Family Medicine

## 2015-03-12 ENCOUNTER — Ambulatory Visit: Payer: BC Managed Care – PPO | Admitting: Family Medicine

## 2015-04-03 ENCOUNTER — Telehealth: Payer: Self-pay | Admitting: Family Medicine

## 2015-04-03 NOTE — Telephone Encounter (Signed)
Patient would like to know if you could sign for her to get a handicap placard if she was to bring in the forms. State that it is because of her knees and back hurting so bad

## 2015-04-04 NOTE — Telephone Encounter (Signed)
Order signed for patient to pick up.

## 2015-04-05 ENCOUNTER — Ambulatory Visit (INDEPENDENT_AMBULATORY_CARE_PROVIDER_SITE_OTHER): Payer: BC Managed Care – PPO | Admitting: Family Medicine

## 2015-04-05 ENCOUNTER — Encounter: Payer: Self-pay | Admitting: Family Medicine

## 2015-04-05 VITALS — BP 140/74 | HR 93 | Temp 98.7°F | Resp 18 | Ht 67.0 in | Wt 292.2 lb

## 2015-04-05 DIAGNOSIS — I10 Essential (primary) hypertension: Secondary | ICD-10-CM

## 2015-04-05 DIAGNOSIS — R739 Hyperglycemia, unspecified: Secondary | ICD-10-CM | POA: Diagnosis not present

## 2015-04-05 DIAGNOSIS — E785 Hyperlipidemia, unspecified: Secondary | ICD-10-CM | POA: Diagnosis not present

## 2015-04-05 DIAGNOSIS — M25561 Pain in right knee: Secondary | ICD-10-CM

## 2015-04-05 DIAGNOSIS — F419 Anxiety disorder, unspecified: Secondary | ICD-10-CM | POA: Diagnosis not present

## 2015-04-05 DIAGNOSIS — Z23 Encounter for immunization: Secondary | ICD-10-CM | POA: Diagnosis not present

## 2015-04-05 DIAGNOSIS — G8929 Other chronic pain: Secondary | ICD-10-CM

## 2015-04-05 LAB — GLUCOSE, POCT (MANUAL RESULT ENTRY): POC Glucose: 94 mg/dl (ref 70–99)

## 2015-04-05 LAB — POCT GLYCOSYLATED HEMOGLOBIN (HGB A1C): Hemoglobin A1C: 6.3

## 2015-04-05 MED ORDER — MELOXICAM 15 MG PO TABS: 15.0000 mg | ORAL_TABLET | Freq: Every day | ORAL | Status: DC

## 2015-04-05 MED ORDER — ALPRAZOLAM 0.5 MG PO TABS: 0.5000 mg | ORAL_TABLET | Freq: Two times a day (BID) | ORAL | Status: DC | PRN

## 2015-04-05 NOTE — Progress Notes (Signed)
Name: Regina Christensen   MRN: [id number]    DOB: 03/24/1956   Date:04/05/2015       Progress Note  Subjective  Chief Complaint  Chief Complaint  Patient presents with  . Hyperlipidemia  . Obesity  . Anxiety  . Hypertension    HPI  Hypertension   Patient presents for follow-up of hypertension. It has been present for over 5 years.  Patient states that there is compliance with medical regimen which consists of Lotensin HCT 20-25 once daily hydralazine 25 mg daily . There is no end organ disease. Cardiac risk factors include hypertension hyperlipidemia and diabetes.  Exercise regimen consist of minimal walking  Diet consist of some salt restriction .  Anxiety  Long-standing history of anxiety associated with depression. She is currently on venlafaxine with alprazolam on a when necessary basis.  Obesity patient is already had gastric operative procedure several years ago. Despite that she has regained a good bit of weight she lost. She is artery scheduled to see dietitian and a psychologist who were associated with her preop evaluation for gastric operative procedure.  Hyperglycemia  Patient is morbidly obese. She has a history of elevated fasting glucose and also elevated hemoglobin A1c of 6.1 on 11/09/2014. Her weight gain is 8 pounds. There is a family history of diabetes  Past Medical History  Diagnosis Date  . Hypertension   . Depression   . Obesity   . Sleep apnea   . Arthritis   . Morbid obesity with BMI of 45.0-49.9, adult [facility](HCC)     Social History  Substance Use Topics  . Smoking status: Never Smoker   . Smokeless tobacco: Never Used  . Alcohol Use: No     Comment: rare     Current outpatient prescriptions:  .  ALPRAZolam (XANAX) 0.5 MG tablet, Take 1 tablet (0.5 mg total) by mouth every 12 (twelve) hours as needed., Disp: 60 tablet, Rfl: 3 .  benazepril-hydrochlorthiazide (LOTENSIN HCT) 20-25 MG per tablet, TAKE 1 TABLET BY MOUTH EVERY DAY, Disp: 30 tablet,  Rfl: 5 .  glucose blood test strip, Use as instructed, Disp: 100 each, Rfl: 12 .  hydrALAZINE (APRESOLINE) 25 MG tablet, TAKE 1 TABLET BY MOUTH TWICE A DAY, Disp: 60 tablet, Rfl: 1 .  meclizine (ANTIVERT) 25 MG tablet, Take 1 tablet (25 mg total) by mouth every 8 (eight) hours as needed for dizziness., Disp: 30 tablet, Rfl: 1 .  meloxicam (MOBIC) 15 MG tablet, Take 15 mg by mouth daily., Disp: , Rfl:  .  RESTASIS 0.05 % ophthalmic emulsion, INSTILL 1 DROP TWICE A DAY INTO BOTH EYES, Disp: , Rfl: 4 .  TRIBENZOR 40-10-25 MG TABS, , Disp: , Rfl:  .  venlafaxine XR (EFFEXOR-XR) 150 MG 24 hr capsule, TAKE 1 CAPSULE DAILY, Disp: 30 capsule, Rfl: 5  No Known Allergies  Review of Systems  Constitutional: Negative for fever, chills and weight loss.  HENT: Negative for congestion, hearing loss, sore throat and tinnitus.   Eyes: Negative for blurred vision, double vision and redness.  Respiratory: Negative for cough, hemoptysis and shortness of breath.   Cardiovascular: Negative for chest pain, palpitations, orthopnea, claudication and leg swelling.  Gastrointestinal: Negative for heartburn, nausea, vomiting, diarrhea, constipation and blood in stool.  Genitourinary: Negative for dysuria, urgency, frequency and hematuria.  Musculoskeletal: Negative for myalgias, back pain, joint pain, falls and neck pain.  Skin: Negative for itching.  Neurological: Negative for dizziness, tingling, tremors, focal weakness, seizures, loss of consciousness, weakness and  headaches.  Endo/Heme/Allergies: Does not bruise/bleed easily.  Psychiatric/Behavioral: Negative for depression and substance abuse. The patient is not nervous/anxious and does not have insomnia.      Objective  Filed Vitals:   04/05/15 1454  BP: 140/74  Pulse: 93  Temp: 98.7 F (37.1 C)  Resp: 18  Height:  (1.702 m)  Weight: 292 lb 3 oz (132.535 kg)  SpO2: 96%     Physical Exam  Constitutional: She is oriented to person, place, and  time.  Morbidly obese and in no acute distress  HENT:  Head: Normocephalic.  Eyes: EOM are normal. Pupils are equal, round, and reactive to light.  Neck: Normal range of motion. No thyromegaly present.  Cardiovascular: Normal rate, regular rhythm and normal heart sounds.   No murmur heard. Pulmonary/Chest: Effort normal and breath sounds normal.  Abdominal: Soft. Bowel sounds are normal.  Musculoskeletal: She exhibits tenderness (both knees.). She exhibits no edema.  Neurological: She is alert and oriented to person, place, and time. No cranial nerve deficit. Gait normal.  Skin: Skin is warm and dry. No rash noted.  Psychiatric: Memory normal.  Anxious and loquacious      Assessment & Plan  1. Hyperglycemia  - POCT glycosylated hemoglobin (Hb A1C) - POCT glucose (manual entry)  2. Acute anxiety Renew alprazolam - ALPRAZolam (XANAX) 0.5 MG tablet; Take 1 tablet (0.5 mg total) by mouth every 12 (twelve) hours as needed.  Dispense: 60 tablet; Refill: 2  3. Knee pain, chronic, right Mobic. She is being considered for stem cell replacement - meloxicam (MOBIC) 15 MG tablet; Take 1 tablet (15 mg total) by mouth daily.  Dispense: 30 tablet; Refill: 5  4. Need for pneumococcal vaccination Given - Pneumococcal conjugate vaccine 13-valent  5. Morbid obesity due to excess calories [facility]) Referral to nutrition and psychologist  6. Hyperlipidemia Well-controlled  7. Essential hypertension At goal of less than 150/90

## 2015-04-05 NOTE — Patient Instructions (Signed)
Return in 3 months.

## 2015-04-19 ENCOUNTER — Telehealth: Payer: Self-pay | Admitting: Family Medicine

## 2015-04-19 NOTE — Telephone Encounter (Signed)
Patient has itchy skin all over including her face and back and the regular itch lotions are not working. She also received the second dose of pneumonia shot and that area is itching as well. A long time ago patient was prescribed a cream but she does not remember the name of it yet she would like to try it again. There is no rash present. Please send cream to [facility] [address]

## 2015-04-24 MED ORDER — TRIAMCINOLONE 0.1 % CREAM:EUCERIN CREAM 1:1: 1.0000 "application " | TOPICAL_CREAM | Freq: Two times a day (BID) | CUTANEOUS | Status: DC

## 2015-04-24 NOTE — Telephone Encounter (Signed)
Triamcinolone was in her old chart, would you like to reorder?

## 2015-04-24 NOTE — Telephone Encounter (Signed)
Eucerin 1-1 with 0.1% triamcinolone cream 1-80 g with 5 refills apply twice a day to rash

## 2015-04-24 NOTE — Telephone Encounter (Signed)
Patient informed and thanks you °

## 2015-04-24 NOTE — Telephone Encounter (Signed)
Cream has been sent to pharmacy with 5 refills. Please inform pt.

## 2015-05-04 ENCOUNTER — Other Ambulatory Visit: Payer: Self-pay | Admitting: Family Medicine

## 2015-06-14 ENCOUNTER — Telehealth: Payer: Self-pay | Admitting: Family Medicine

## 2015-06-14 DIAGNOSIS — F419 Anxiety disorder, unspecified: Secondary | ICD-10-CM

## 2015-06-14 MED ORDER — FUROSEMIDE 20 MG PO TABS: 20.0000 mg | ORAL_TABLET | Freq: Two times a day (BID) | ORAL | Status: DC

## 2015-06-14 NOTE — Telephone Encounter (Signed)
Patient informed. 

## 2015-06-14 NOTE — Telephone Encounter (Signed)
Sent enough to cover pt until her next appointment to her pharmacy.

## 2015-06-14 NOTE — Telephone Encounter (Signed)
Patient had to reschedule appointment for April 13. Requesting 2 refills on Furosemide , please send to [facility] [address]

## 2015-06-29 ENCOUNTER — Ambulatory Visit: Payer: BC Managed Care – PPO | Admitting: Family Medicine

## 2015-07-02 ENCOUNTER — Other Ambulatory Visit: Payer: Self-pay | Admitting: Family Medicine

## 2015-07-19 ENCOUNTER — Telehealth: Payer: Self-pay | Admitting: Family Medicine

## 2015-07-19 MED ORDER — HYDRALAZINE HCL 25 MG PO TABS: 25.0000 mg | ORAL_TABLET | Freq: Two times a day (BID) | ORAL | Status: DC

## 2015-07-19 NOTE — Telephone Encounter (Signed)
Patient informed. 

## 2015-07-19 NOTE — Telephone Encounter (Signed)
Requesting refill on Hydralazine 25mg  twice daily. Please send to [facility] [address]

## 2015-07-19 NOTE — Telephone Encounter (Signed)
Refill has been sent to pt pharmacy 

## 2015-08-02 ENCOUNTER — Ambulatory Visit: Payer: BC Managed Care – PPO | Admitting: Family Medicine

## 2015-08-18 ENCOUNTER — Emergency Department: Payer: BC Managed Care – PPO

## 2015-08-18 ENCOUNTER — Observation Stay
Admission: EM | Admit: 2015-08-18 | Discharge: 2015-08-19 | Disposition: A | Discharge status: Home / Self Care | Payer: BC Managed Care – PPO | Attending: Internal Medicine | Admitting: Internal Medicine

## 2015-08-18 ENCOUNTER — Encounter: Payer: Self-pay | Admitting: Emergency Medicine

## 2015-08-18 DIAGNOSIS — R918 Other nonspecific abnormal finding of lung field: Secondary | ICD-10-CM | POA: Insufficient documentation

## 2015-08-18 DIAGNOSIS — R42 Dizziness and giddiness: Secondary | ICD-10-CM | POA: Diagnosis present

## 2015-08-18 DIAGNOSIS — Z7982 Long term (current) use of aspirin: Secondary | ICD-10-CM | POA: Insufficient documentation

## 2015-08-18 DIAGNOSIS — Z9884 Bariatric surgery status: Secondary | ICD-10-CM | POA: Insufficient documentation

## 2015-08-18 DIAGNOSIS — M1990 Unspecified osteoarthritis, unspecified site: Secondary | ICD-10-CM | POA: Insufficient documentation

## 2015-08-18 DIAGNOSIS — I10 Essential (primary) hypertension: Secondary | ICD-10-CM | POA: Insufficient documentation

## 2015-08-18 DIAGNOSIS — F329 Major depressive disorder, single episode, unspecified: Secondary | ICD-10-CM | POA: Insufficient documentation

## 2015-08-18 DIAGNOSIS — Z9889 Other specified postprocedural states: Secondary | ICD-10-CM | POA: Diagnosis not present

## 2015-08-18 DIAGNOSIS — R7989 Other specified abnormal findings of blood chemistry: Secondary | ICD-10-CM | POA: Diagnosis present

## 2015-08-18 DIAGNOSIS — F419 Anxiety disorder, unspecified: Secondary | ICD-10-CM | POA: Insufficient documentation

## 2015-08-18 DIAGNOSIS — X58XXXA Exposure to other specified factors, initial encounter: Secondary | ICD-10-CM | POA: Insufficient documentation

## 2015-08-18 DIAGNOSIS — Z6841 Body Mass Index (BMI) 40.0 and over, adult: Secondary | ICD-10-CM | POA: Diagnosis not present

## 2015-08-18 DIAGNOSIS — R748 Abnormal levels of other serum enzymes: Secondary | ICD-10-CM | POA: Insufficient documentation

## 2015-08-18 DIAGNOSIS — G4730 Sleep apnea, unspecified: Secondary | ICD-10-CM | POA: Insufficient documentation

## 2015-08-18 DIAGNOSIS — Z8249 Family history of ischemic heart disease and other diseases of the circulatory system: Secondary | ICD-10-CM | POA: Diagnosis not present

## 2015-08-18 DIAGNOSIS — Z90710 Acquired absence of both cervix and uterus: Secondary | ICD-10-CM | POA: Insufficient documentation

## 2015-08-18 DIAGNOSIS — T502X5A Adverse effect of carbonic-anhydrase inhibitors, benzothiadiazides and other diuretics, initial encounter: Secondary | ICD-10-CM | POA: Diagnosis not present

## 2015-08-18 DIAGNOSIS — H8112 Benign paroxysmal vertigo, left ear: Principal | ICD-10-CM | POA: Insufficient documentation

## 2015-08-18 DIAGNOSIS — E876 Hypokalemia: Secondary | ICD-10-CM | POA: Diagnosis not present

## 2015-08-18 DIAGNOSIS — R778 Other specified abnormalities of plasma proteins: Secondary | ICD-10-CM

## 2015-08-18 LAB — COMPREHENSIVE METABOLIC PANEL
ALT: 17 U/L (ref 14–54)
AST: 25 U/L (ref 15–41)
Albumin: 3.7 g/dL (ref 3.5–5.0)
Alkaline Phosphatase: 71 U/L (ref 38–126)
Anion gap: 9 (ref 5–15)
BUN: 11 mg/dL (ref 6–20)
CO2: 27 mmol/L (ref 22–32)
Calcium: 8.8 mg/dL — ABNORMAL LOW (ref 8.9–10.3)
Chloride: 105 mmol/L (ref 101–111)
Creatinine, Ser: 0.8 mg/dL (ref 0.44–1.00)
GFR calc Af Amer: >60 mL/min (ref >60)
GFR calc non Af Amer: >60 mL/min (ref >60)
Glucose, Bld: 143 mg/dL — ABNORMAL HIGH (ref 65–99)
Potassium: 3.2 mmol/L — ABNORMAL LOW (ref 3.5–5.1)
Sodium: 141 mmol/L (ref 135–145)
Total Bilirubin: 0.4 mg/dL (ref 0.3–1.2)
Total Protein: 7 g/dL (ref 6.5–8.1)

## 2015-08-18 LAB — CBC WITH DIFFERENTIAL/PLATELET
Basophils Absolute: 0.1 10*3/uL (ref 0–0.1)
Basophils Relative: 1 %
Eosinophils Absolute: 0.2 10*3/uL (ref 0–0.7)
Eosinophils Relative: 2 %
HCT: 37.5 % (ref 35.0–47.0)
Hemoglobin: 12.6 g/dL (ref 12.0–16.0)
Lymphocytes Relative: 13 %
Lymphs Abs: 1.6 10*3/uL (ref 1.0–3.6)
MCH: 28.6 pg (ref 26.0–34.0)
MCHC: 33.6 g/dL (ref 32.0–36.0)
MCV: 85.1 fL (ref 80.0–100.0)
Monocytes Absolute: 0.7 10*3/uL (ref 0.2–0.9)
Monocytes Relative: 6 %
Neutro Abs: 9.8 10*3/uL — ABNORMAL HIGH (ref 1.4–6.5)
Neutrophils Relative %: 78 %
Platelets: 267 10*3/uL (ref 150–440)
RBC: 4.4 MIL/uL (ref 3.80–5.20)
RDW: 16.2 % — ABNORMAL HIGH (ref 11.5–14.5)
WBC: 12.4 10*3/uL — ABNORMAL HIGH (ref 3.6–11.0)

## 2015-08-18 LAB — TROPONIN I
Troponin I: 0.06 ng/mL — ABNORMAL HIGH (ref <0.031)
Troponin I: 0.06 ng/mL — ABNORMAL HIGH (ref <0.031)

## 2015-08-18 MED ORDER — BISACODYL 5 MG PO TBEC: 5.0000 mg | DELAYED_RELEASE_TABLET | Freq: Every day | ORAL | Status: DC | PRN

## 2015-08-18 MED ORDER — HYDROCODONE-ACETAMINOPHEN 5-325 MG PO TABS: 1.0000 | ORAL_TABLET | ORAL | Status: DC | PRN

## 2015-08-18 MED ORDER — ACETAMINOPHEN 325 MG PO TABS
650.0000 mg | ORAL_TABLET | Freq: Four times a day (QID) | ORAL | Status: DC | PRN
  Administered 2015-08-19: 650 mg via ORAL
  Filled 2015-08-18: qty 2

## 2015-08-18 MED ORDER — ASPIRIN EC 81 MG PO TBEC
81.0000 mg | DELAYED_RELEASE_TABLET | Freq: Every day | ORAL | Status: DC
  Administered 2015-08-19: 09:00:00 81 mg via ORAL
  Filled 2015-08-18: qty 1

## 2015-08-18 MED ORDER — TRIAMCINOLONE ACETONIDE 55 MCG/ACT NA AERO
2.0000 | INHALATION_SPRAY | Freq: Every day | NASAL | Status: DC
  Filled 2015-08-18: qty 21.6

## 2015-08-18 MED ORDER — ONDANSETRON HCL 4 MG/2ML IJ SOLN
4.0000 mg | Freq: Once | INTRAMUSCULAR | Status: AC
  Administered 2015-08-18: 4 mg via INTRAVENOUS
  Filled 2015-08-18: qty 2

## 2015-08-18 MED ORDER — DOCUSATE SODIUM 100 MG PO CAPS
100.0000 mg | ORAL_CAPSULE | Freq: Two times a day (BID) | ORAL | Status: DC
  Administered 2015-08-19: 09:00:00 100 mg via ORAL
  Filled 2015-08-18 (×2): qty 1

## 2015-08-18 MED ORDER — MELOXICAM 15 MG PO TABS: 15.0000 mg | ORAL_TABLET | Freq: Every day | ORAL | Status: DC

## 2015-08-18 MED ORDER — SODIUM CHLORIDE 0.9 % IV BOLUS (SEPSIS)
1000.0000 mL | Freq: Once | INTRAVENOUS | Status: AC
  Administered 2015-08-18: 1000 mL via INTRAVENOUS

## 2015-08-18 MED ORDER — POTASSIUM CHLORIDE CRYS ER 20 MEQ PO TBCR
40.0000 meq | EXTENDED_RELEASE_TABLET | Freq: Once | ORAL | Status: AC
  Administered 2015-08-18: 40 meq via ORAL
  Filled 2015-08-18: qty 2

## 2015-08-18 MED ORDER — MECLIZINE HCL 25 MG PO TABS
25.0000 mg | ORAL_TABLET | Freq: Three times a day (TID) | ORAL | Status: DC
  Administered 2015-08-18 – 2015-08-19 (×2): 25 mg via ORAL
  Filled 2015-08-18 (×2): qty 1

## 2015-08-18 MED ORDER — BENAZEPRIL-HYDROCHLOROTHIAZIDE 20-25 MG PO TABS: 1.0000 | ORAL_TABLET | Freq: Every day | ORAL | Status: DC

## 2015-08-18 MED ORDER — ONDANSETRON HCL 4 MG PO TABS: 4.0000 mg | ORAL_TABLET | Freq: Four times a day (QID) | ORAL | Status: DC | PRN

## 2015-08-18 MED ORDER — ONDANSETRON HCL 4 MG/2ML IJ SOLN: 4.0000 mg | Freq: Four times a day (QID) | INTRAMUSCULAR | Status: DC | PRN

## 2015-08-18 MED ORDER — FUROSEMIDE 20 MG PO TABS
20.0000 mg | ORAL_TABLET | Freq: Two times a day (BID) | ORAL | Status: DC
  Administered 2015-08-19: 09:00:00 20 mg via ORAL
  Filled 2015-08-18: qty 1

## 2015-08-18 MED ORDER — LORATADINE 10 MG PO TABS: 10.0000 mg | ORAL_TABLET | Freq: Every day | ORAL | Status: DC

## 2015-08-18 MED ORDER — HEPARIN SODIUM (PORCINE) 5000 UNIT/ML IJ SOLN
5000.0000 [IU] | Freq: Three times a day (TID) | INTRAMUSCULAR | Status: DC
  Administered 2015-08-18 – 2015-08-19 (×2): 5000 [IU] via SUBCUTANEOUS
  Filled 2015-08-18 (×2): qty 1

## 2015-08-18 MED ORDER — HYDROCHLOROTHIAZIDE 25 MG PO TABS
25.0000 mg | ORAL_TABLET | Freq: Every day | ORAL | Status: DC
  Administered 2015-08-19: 09:00:00 25 mg via ORAL
  Filled 2015-08-18: qty 1

## 2015-08-18 MED ORDER — ACETAMINOPHEN 650 MG RE SUPP: 650.0000 mg | Freq: Four times a day (QID) | RECTAL | Status: DC | PRN

## 2015-08-18 MED ORDER — ASPIRIN EC 81 MG PO TBEC: 81.0000 mg | DELAYED_RELEASE_TABLET | Freq: Every day | ORAL | Status: DC

## 2015-08-18 MED ORDER — LORATADINE 10 MG PO TABS
10.0000 mg | ORAL_TABLET | Freq: Every day | ORAL | Status: DC
  Administered 2015-08-19: 10 mg via ORAL
  Filled 2015-08-18: qty 1

## 2015-08-18 MED ORDER — POTASSIUM CHLORIDE 20 MEQ PO PACK
40.0000 meq | PACK | Freq: Every day | ORAL | Status: DC
  Administered 2015-08-19: 40 meq via ORAL
  Filled 2015-08-18: qty 2

## 2015-08-18 MED ORDER — HYDRALAZINE HCL 25 MG PO TABS
25.0000 mg | ORAL_TABLET | Freq: Two times a day (BID) | ORAL | Status: DC
  Administered 2015-08-18 – 2015-08-19 (×2): 25 mg via ORAL
  Filled 2015-08-18 (×2): qty 1

## 2015-08-18 MED ORDER — LORAZEPAM 2 MG/ML IJ SOLN
1.0000 mg | Freq: Once | INTRAMUSCULAR | Status: AC
  Administered 2015-08-18: 1 mg via INTRAVENOUS
  Filled 2015-08-18: qty 1

## 2015-08-18 MED ORDER — TRAZODONE HCL 50 MG PO TABS: 25.0000 mg | ORAL_TABLET | Freq: Every evening | ORAL | Status: DC | PRN

## 2015-08-18 MED ORDER — MELOXICAM 15 MG PO TABS
15.0000 mg | ORAL_TABLET | Freq: Every day | ORAL | Status: DC
  Administered 2015-08-19: 10:00:00 15 mg via ORAL
  Filled 2015-08-18: qty 1

## 2015-08-18 MED ORDER — VENLAFAXINE HCL ER 150 MG PO CP24
150.0000 mg | ORAL_CAPSULE | Freq: Every day | ORAL | Status: DC
  Administered 2015-08-19: 10:00:00 150 mg via ORAL
  Filled 2015-08-18: qty 1

## 2015-08-18 MED ORDER — ASPIRIN 81 MG PO CHEW: 324.0000 mg | CHEWABLE_TABLET | Freq: Once | ORAL | Status: DC

## 2015-08-18 MED ORDER — ASPIRIN 81 MG PO CHEW
324.0000 mg | CHEWABLE_TABLET | Freq: Once | ORAL | Status: AC
  Administered 2015-08-18: 324 mg via ORAL
  Filled 2015-08-18: qty 4

## 2015-08-18 MED ORDER — BENAZEPRIL HCL 20 MG PO TABS
20.0000 mg | ORAL_TABLET | Freq: Every day | ORAL | Status: DC
  Administered 2015-08-19: 20 mg via ORAL
  Filled 2015-08-18: qty 1

## 2015-08-18 NOTE — ED Notes (Signed)
Report given to Laurie, RN.

## 2015-08-18 NOTE — H&P (Signed)
[facility] - [place] at [facility]   PATIENT NAME: Regina Christensen    MR#:  [id number]  DATE OF BIRTH:  [birth year]  DATE OF ADMISSION:  08/18/2015  PRIMARY CARE PHYSICIAN: Dennison Mascot, MD   REQUESTING/REFERRING PHYSICIAN: Dr.Gayle  CHIEF COMPLAINT: Dizziness    Chief Complaint  Patient presents with  . Dizziness    HISTORY OF PRESENT ILLNESS:  Regina Christensen  is a 60 y.o. female with a known history of hypertension, depression, morbid obesity came in because of dizziness. Patient felt the room is spinning , has some nausea. No double vision, no weakness. Has history of positional vertigo, takes a nap with meclizine on and off. ER doctor called for admission because of slightly elevated troponin of 0.06. But patient never had chest pain EKG is unremarkable. Second troponin also is 0.06 only. Patient will be admitted to observation because of dizziness and family states that they live  in the second floor and cannot take her with the severe dizziness. Patient prefers to stay overnight.  complains of lot of seasonal allergies.  PAST MEDICAL HISTORY:   Past Medical History  Diagnosis Date  . Hypertension   . Depression   . Obesity   . Sleep apnea   . Arthritis   . Morbid obesity with BMI of 45.0-49.9, adult (HCC)     PAST SURGICAL HISTOIRY:   Past Surgical History  Procedure Laterality Date  . Abdominal hysterectomy    . Endometrial ablation      x2  . Gastric bypass      sleeve    SOCIAL HISTORY:   Social History  Substance Use Topics  . Smoking status: Never Smoker   . Smokeless tobacco: Never Used  . Alcohol Use: No     Comment: rare    FAMILY HISTORY:   Family History  Problem Relation Age of Onset  . Heart disease Father   . Cancer Brother     DRUG ALLERGIES:  No Known Allergies  REVIEW OF SYSTEMS:  CONSTITUTIONAL: No fever, fatigue or weakness.  EYES: No blurred or double vision.  EARS, NOSE, AND THROAT: No tinnitus  or ear pain.  RESPIRATORY: No cough, shortness of breath, wheezing or hemoptysis.  CARDIOVASCULAR: No chest pain, orthopnea, edema.  GASTROINTESTINAL: No nausea, vomiting, diarrhea or abdominal pain.  GENITOURINARY: No dysuria, hematuria.  ENDOCRINE: No polyuria, nocturia,  HEMATOLOGY: No anemia, easy bruising or bleeding SKIN: No rash or lesion. MUSCULOSKELETAL: No joint pain or arthritis.   NEUROLOGIC: No tingling, numbness, weakness. Has positional vertigo thousand allergies seasonal allergies. PSYCHIATRY: No anxiety or depression.   MEDICATIONS AT HOME:   Prior to Admission medications   Medication Sig Start Date End Date Taking? Authorizing Provider  ALPRAZolam Prudy Feeler) 0.5 MG tablet Take 1 tablet (0.5 mg total) by mouth every 12 (twelve) hours as needed. 04/05/15  Yes Dennison Mascot, MD  aspirin EC 81 MG tablet Take 81 mg by mouth daily.   Yes Historical Provider, MD  benazepril-hydrochlorthiazide (LOTENSIN HCT) 20-25 MG tablet TAKE 1 TABLET BY MOUTH EVERY DAY 05/04/15  Yes Dennison Mascot, MD  fexofenadine (ALLEGRA) 180 MG tablet Take 180 mg by mouth daily.   Yes Historical Provider, MD  furosemide (LASIX) 20 MG tablet Take 1 tablet (20 mg total) by mouth 2 (two) times daily. 06/14/15  Yes Dennison Mascot, MD  glucose blood test strip Use as instructed 11/09/14  Yes Dennison Mascot, MD  hydrALAZINE (APRESOLINE) 25 MG tablet Take 1 tablet (25 mg total)  by mouth 2 (two) times daily. 07/19/15  Yes Dennison MascotLemont Morrisey, MD  meclizine (ANTIVERT) 25 MG tablet Take 1 tablet (25 mg total) by mouth every 8 (eight) hours as needed for dizziness. 11/14/14  Yes Dennison MascotLemont Morrisey, MD  meloxicam (MOBIC) 15 MG tablet Take 1 tablet (15 mg total) by mouth daily. 04/05/15  Yes Dennison MascotLemont Morrisey, MD  RESTASIS 0.05 % ophthalmic emulsion INSTILL 1 DROP INTO BOTH EYES TWICE A DAY. 01/05/15  Yes Historical Provider, MD  triamcinolone (NASACORT) 55 MCG/ACT AERO nasal inhaler Place 2 sprays into the nose daily.   Yes  Historical Provider, MD  Triamcinolone Acetonide (TRIAMCINOLONE 0.1 % CREAM : EUCERIN) CREA Apply 1 application topically 2 (two) times daily. 04/24/15  Yes Dennison MascotLemont Morrisey, MD  venlafaxine XR (EFFEXOR-XR) 150 MG 24 hr capsule TAKE 1 CAPSULE DAILY 07/02/15  Yes Dennison MascotLemont Morrisey, MD      VITAL SIGNS:  Blood pressure 168/83, pulse 84, temperature 98.1 F (36.7 C), temperature source Oral, resp. rate 18, height 5\' 8"  (1.727 m), weight 127.007 kg (280 lb), SpO2 100 %.  PHYSICAL EXAMINATION:  GENERAL:  60 y.o.-year-old patient lying in the bed with no acute distress EYES: Pupils equal, round, reactive to light and accommodation. No scleral icterus. Extraocular muscles intact.  HEENT: Head atraumatic, normocephalic. Oropharynx and nasopharynx clear.  NECK:  Supple, no jugular venous distention. No thyroid enlargement, no tenderness.  LUNGS: Normal breath sounds bilaterally, no wheezing, rales,rhonchi or crepitation. No use of accessory muscles of respiration.  CARDIOVASCULAR: S1, S2 normal. No murmurs, rubs, or gallops.  ABDOMEN: Soft, nontender, nondistended. Bowel sounds present. No organomegaly or mass.  EXTREMITIES: No pedal edema, cyanosis, or clubbing.  NEUROLOGIC: Cranial nerves II through XII are intact. Muscle strength 5/5 in all extremities. Sensation intact. Gait not checked.  PSYCHIATRIC: The patient is alert and oriented x 3.  SKIN: No obvious rash, lesion, or ulcer.   LABORATORY PANEL:   CBC  Recent Labs Lab 08/18/15 1514  WBC 12.4*  HGB 12.6  HCT 37.5  PLT 267   ------------------------------------------------------------------------------------------------------------------  Chemistries   Recent Labs Lab 08/18/15 1514  NA 141  K 3.2*  CL 105  CO2 27  GLUCOSE 143*  BUN 11  CREATININE 0.80  CALCIUM 8.8*  AST 25  ALT 17  ALKPHOS 71  BILITOT 0.4    ------------------------------------------------------------------------------------------------------------------  Cardiac Enzymes  Recent Labs Lab 08/18/15 1821  TROPONINI 0.06*   ------------------------------------------------------------------------------------------------------------------  RADIOLOGY:  Ct Head Wo Contrast  08/18/2015  CLINICAL DATA:  Vertigo. EXAM: CT HEAD WITHOUT CONTRAST TECHNIQUE: Contiguous axial images were obtained from the base of the skull through the vertex without intravenous contrast. COMPARISON:  Brain MRI dated 04/08/2011. FINDINGS: Brain: No evidence of acute infarction, hemorrhage, extra-axial collection, ventriculomegaly, or mass effect. Vascular: No hyperdense vessel or unexpected calcification. Skull: Negative for fracture or focal lesion. Sinuses/Orbits: No acute findings. Other: None. IMPRESSION: Negative head CT. Electronically Signed   By: Bary RichardStan  Maynard M.D.   On: 08/18/2015 17:23   Dg Chest Portable 1 View  08/18/2015  CLINICAL DATA:  60 year old female with history of vertigo. EXAM: PORTABLE CHEST 1 VIEW COMPARISON:  Chest x-ray 11/21/2011. FINDINGS: Lung volumes are low. No consolidative airspace disease. No pleural effusions. No pneumothorax. No pulmonary nodule or mass noted. Pulmonary vasculature and the cardiomediastinal silhouette are within normal limits. IMPRESSION: 1. Low lung volumes without radiographic evidence of acute cardiopulmonary disease. Electronically Signed   By: Trudie Reedaniel  Entrikin M.D.   On: 08/18/2015 17:02    EKG:  Orders placed or performed during the hospital encounter of 08/18/15  . ED EKG  . ED EKG  NSR 83 BPM,,no ST  T changes  IMPRESSION AND PLAN:   1. dizziness secondary to benign positional vertigo: Head CT unremarkable. Meclizine helped her before and also she says she tried meclizine today with some improvement of her symptoms. Continue meclizine 25 mg every 8 hours , and placed in observation status. #2  hypokalemia secondary to diuretics: Replace the potassium. #3 marginally elevated troponins of unclear significance. No further workup needed as patient has no chest pain and EKG is unremarkable. #4. History of anxiety, depression: Patient takes Effexor XR, Xanax as needed. #5 essential hypertension: Patient is on Lotensin, hydralazine., Furosemide.  All the records are reviewed and case discussed with ED provider. Management plans discussed with the patient, family and they are in agreement.  CODE STATUS: Full  TOTAL TIME TAKING CARE OF THIS PATIENT:55 minutes.    Katha Hamming M.D on 08/18/2015 at 8:02 PM  Between 7am to 6pm - Pager - [phone]  After 6pm go to [email or web address] - password [email or web address]  [facility]  Office  [phone]  CC: Primary care physician; Dennison Mascot, MD  Note: This dictation was prepared with Dragon dictation along with smaller phrase technology. Any transcriptional errors that result from this process are unintentional.

## 2015-08-18 NOTE — ED Notes (Signed)
Dr. Inocencio HomesGayle informed of troponin of 0.06

## 2015-08-18 NOTE — ED Notes (Signed)
[facility] Vertigo. Patient states that she has a hx/o vertigo and was out to lunch with her family when she started to feel like the room was spinning. Patient went home and took her Meclizine however her symptoms have not improved. Per EMS stroke screen was negative. Patient denies numbness, tingling, or weakness on one side of the body.

## 2015-08-18 NOTE — ED Provider Notes (Addendum)
[facility] Emergency Department Provider Note  ____________________________________________  Time seen: Approximately 3:05 PM  I have reviewed the triage vital signs and the nursing notes.   HISTORY  Chief Complaint Dizziness    HPI Regina Christensen is a 60 y.o. female with history of vertigo, hypertension, anxiety, depression and hyperlipidemia who presents for evaluation of sudden onset room spinning dizziness worse with position change that began just prior to arrival, gradual onset, initially severe now moderate. Patient reports she has had mild episodes of dizziness before however they have never been this severe, associated with sweating, hot flashes. She has also felt nauseated. She took her prescribed meclizine however this only minimally improved her symptoms. No vomiting, diarrhea, fevers or chills. No headache, numbness or weakness. No chest pain or difficulty breathing.   Past Medical History  Diagnosis Date  . Hypertension   . Depression   . Obesity   . Sleep apnea   . Arthritis   . Morbid obesity with BMI of 45.0-49.9, adult [facility])     Patient Active Problem List   Diagnosis Date Noted  . Hyperlipidemia 11/10/2014  . Essential hypertension 11/09/2014  . Hyperglycemia 11/09/2014  . Acute anxiety 11/09/2014  . Acanthosis nigricans 11/09/2014  . Obesity 11/09/2014  . Morbid obesity (HCC) 01/06/2012  . Left breast mass 01/06/2012    Past Surgical History  Procedure Laterality Date  . Abdominal hysterectomy    . Endometrial ablation      x2  . Gastric bypass      sleeve    Current Outpatient Rx  Name  Route  Sig  Dispense  Refill  . ALPRAZolam (XANAX) 0.5 MG tablet   Oral   Take 1 tablet (0.5 mg total) by mouth every 12 (twelve) hours as needed.   60 tablet   2   . aspirin EC 81 MG tablet   Oral   Take 81 mg by mouth daily.         . benazepril-hydrochlorthiazide (LOTENSIN HCT) 20-25 MG tablet      TAKE 1 TABLET BY  MOUTH EVERY DAY   30 tablet   5   . fexofenadine (ALLEGRA) 180 MG tablet   Oral   Take 180 mg by mouth daily.         . furosemide (LASIX) 20 MG tablet   Oral   Take 1 tablet (20 mg total) by mouth 2 (two) times daily.   60 tablet   1   . glucose blood test strip      Use as instructed   100 each   12   . hydrALAZINE (APRESOLINE) 25 MG tablet   Oral   Take 1 tablet (25 mg total) by mouth 2 (two) times daily.   60 tablet   1   . meclizine (ANTIVERT) 25 MG tablet   Oral   Take 1 tablet (25 mg total) by mouth every 8 (eight) hours as needed for dizziness.   30 tablet   1   . meloxicam (MOBIC) 15 MG tablet   Oral   Take 1 tablet (15 mg total) by mouth daily.   30 tablet   5   . RESTASIS 0.05 % ophthalmic emulsion      INSTILL 1 DROP INTO BOTH EYES TWICE A DAY.      4     Dispense as written.   . triamcinolone (NASACORT) 55 MCG/ACT AERO nasal inhaler   Nasal   Place 2 sprays into the nose  daily.         . Triamcinolone Acetonide (TRIAMCINOLONE 0.1 % CREAM : EUCERIN) CREA   Topical   Apply 1 application topically 2 (two) times daily.   1 each   5   . venlafaxine XR (EFFEXOR-XR) 150 MG 24 hr capsule      TAKE 1 CAPSULE DAILY   30 capsule   5     Allergies Review of patient's allergies indicates no known allergies.  Family History  Problem Relation Age of Onset  . Heart disease Father   . Cancer Brother     Social History Social History  Substance Use Topics  . Smoking status: Never Smoker   . Smokeless tobacco: Never Used  . Alcohol Use: No     Comment: rare    Review of Systems Constitutional: No fever/chills Eyes: No visual changes. ENT: No sore throat. Cardiovascular: Denies chest pain. Respiratory: Denies shortness of breath. Gastrointestinal: No abdominal pain.  No nausea, no vomiting.  No diarrhea.  No constipation. Genitourinary: Negative for dysuria. Musculoskeletal: Negative for back pain. Skin: Negative for  rash. Neurological: Negative for headaches, focal weakness or numbness.  10-point ROS otherwise negative.  ____________________________________________   PHYSICAL EXAM:  Filed Vitals:   08/18/15 1800 08/18/15 1830 08/18/15 1900 08/18/15 1942  BP: 145/74 146/69 161/93 168/83  Pulse: 84 81 80 84  Temp:      TempSrc:      Resp:    18  Height:      Weight:      SpO2: 100% 100% 97% 100%    VITAL SIGNS: ED Triage Vitals  Enc Vitals Group     BP --      Pulse --      Resp --      Temp --      Temp src --      SpO2 08/18/15 1502 97 %     Weight --      Height --      Head Cir --      Peak Flow --      Pain Score --      Pain Loc --      Pain Edu? --      Excl. in GC? --     Constitutional: Alert and oriented. Well appearing and in no acute distress. Eyes: Conjunctivae are normal. PERRL. EOMI. Head: Atraumatic. Nose: No congestion/rhinnorhea. Mouth/Throat: Mucous membranes are moist.  Oropharynx non-erythematous. Neck: No stridor. Supple without meningismus. Cardiovascular: Normal rate, regular rhythm. Grossly normal heart sounds.  Good peripheral circulation. Respiratory: Normal respiratory effort.  No retractions. Lungs CTAB. Gastrointestinal: Soft and nontender. No distention. No CVA tenderness. Genitourinary: deferred Musculoskeletal: No lower extremity tenderness nor edema.  No joint effusions. Neurologic:  Normal speech and language. No gross focal neurologic deficits are appreciated. 5 out of 5 strength bilateral upper and lower extremity, sensation intact to light touch throughout, cranial nerves II through XII intact, normal finger-nose-finger though the exercise does exacerbate her dizziness. Skin:  Skin is warm, dry and intact. No rash noted. Psychiatric: Mood and affect are normal. Speech and behavior are normal.  ____________________________________________   LABS (all labs ordered are listed, but only abnormal results are displayed)  Labs Reviewed   CBC WITH DIFFERENTIAL/PLATELET - Abnormal; Notable for the following:    WBC 12.4 (*)    RDW 16.2 (*)    Neutro Abs 9.8 (*)    All other components within normal limits  COMPREHENSIVE METABOLIC PANEL -  Abnormal; Notable for the following:    Potassium 3.2 (*)    Glucose, Bld 143 (*)    Calcium 8.8 (*)    All other components within normal limits  TROPONIN I - Abnormal; Notable for the following:    Troponin I 0.06 (*)    All other components within normal limits  TROPONIN I - Abnormal; Notable for the following:    Troponin I 0.06 (*)    All other components within normal limits   ____________________________________________  EKG  ED ECG REPORT I, Gayla Doss, the attending physician, personally viewed and interpreted this ECG.   Date: 08/18/2015  EKG Time: 15:11  Rate: 83  Rhythm: normal sinus rhythm  Axis: normal  Intervals:none  ST&T Change: No acute ST elevation. LVH. Borderline nonspecific T-wave abnormalities in the inferior leads. Normal QTC.  ____________________________________________  RADIOLOGY  CT head  IMPRESSION: Negative head CT.   CXR IMPRESSION: 1. Low lung volumes without radiographic evidence of acute cardiopulmonary disease. ____________________________________________   PROCEDURES  Procedure(s) performed: None  Critical Care performed: No  ____________________________________________   INITIAL IMPRESSION / ASSESSMENT AND PLAN / ED COURSE  Pertinent labs & imaging results that were available during my care of the patient were reviewed by me and considered in my medical decision making (see chart for details).  Regina Christensen is a 60 y.o. female with history of vertigo, hypertension, anxiety, depression and hyperlipidemia who presents for evaluation of sudden onset room spinning dizziness worse with position change that began just prior to arrival. Clinical picture is consistent with peripheral vertigo. She has an intact  neurological examination and I doubt acute posterior CVA. EKG is reassuring, not consistent with acute ischemia. Plan for basic labs, we will observe her, treat her symptomatically and reassess for disposition.  ----------------------------------------- 5:45 PM on 08/18/2015 ----------------------------------------- Labs reviewed. CBC is notable for mild leukocytosis. CMP with mild hypokalemia. Troponin is elevated at 0.06, we'll give aspirin. CT head and chest x-ray negative. I suspect her troponin elevated elevation may represent demand ischemia however given that she has multiple risk factors for ACS, however she was admission for cycling of enzymes. Case discussed with the hospitalist, Dr. Luberta Mutter, at this time for admission.  ____________________________________________   FINAL CLINICAL IMPRESSION(S) / ED DIAGNOSES  Final diagnoses:  Vertigo  Troponin level elevated      Gayla Doss, MD 08/18/15 1747  Gayla Doss, MD 08/18/15 7829

## 2015-08-18 NOTE — ED Notes (Signed)
Pt given sandwich tray and soda to drink; waiting for floor to accept bed to call report and transport pt

## 2015-08-19 LAB — BASIC METABOLIC PANEL
Anion gap: 7 (ref 5–15)
BUN: 10 mg/dL (ref 6–20)
CO2: 28 mmol/L (ref 22–32)
Calcium: 8.6 mg/dL — ABNORMAL LOW (ref 8.9–10.3)
Chloride: 107 mmol/L (ref 101–111)
Creatinine, Ser: 0.66 mg/dL (ref 0.44–1.00)
GFR calc Af Amer: >60 mL/min (ref >60)
GFR calc non Af Amer: >60 mL/min (ref >60)
Glucose, Bld: 100 mg/dL — ABNORMAL HIGH (ref 65–99)
Potassium: 3.3 mmol/L — ABNORMAL LOW (ref 3.5–5.1)
Sodium: 142 mmol/L (ref 135–145)

## 2015-08-19 LAB — CBC
HCT: 37.4 % (ref 35.0–47.0)
Hemoglobin: 12.2 g/dL (ref 12.0–16.0)
MCH: 28.6 pg (ref 26.0–34.0)
MCHC: 32.7 g/dL (ref 32.0–36.0)
MCV: 87.4 fL (ref 80.0–100.0)
Platelets: 235 10*3/uL (ref 150–440)
RBC: 4.28 MIL/uL (ref 3.80–5.20)
RDW: 16.2 % — ABNORMAL HIGH (ref 11.5–14.5)
WBC: 9.2 10*3/uL (ref 3.6–11.0)

## 2015-08-19 LAB — GLUCOSE, CAPILLARY: Glucose-Capillary: 86 mg/dL (ref 65–99)

## 2015-08-19 MED ORDER — POTASSIUM CHLORIDE CRYS ER 20 MEQ PO TBCR
40.0000 meq | EXTENDED_RELEASE_TABLET | Freq: Once | ORAL | Status: AC
  Administered 2015-08-19: 09:00:00 40 meq via ORAL
  Filled 2015-08-19: qty 2

## 2015-08-19 MED ORDER — FLUTICASONE PROPIONATE 50 MCG/ACT NA SUSP
2.0000 | Freq: Every day | NASAL | Status: DC
  Filled 2015-08-19: qty 16

## 2015-08-19 MED ORDER — BENAZEPRIL HCL 20 MG PO TABS: 20.0000 mg | ORAL_TABLET | Freq: Every day | ORAL | Status: DC

## 2015-08-19 NOTE — Progress Notes (Addendum)
1100: Seen by MD - Patient for discharge.  1200  -All instructions given and explained to patient and family. Patient wheeled to door with family. Discharged.

## 2015-08-19 NOTE — Discharge Summary (Signed)
[employer] [facility] - [place] [facility]   PATIENT NAME: Regina Christensen    MR#:  [id number]  DATE OF BIRTH:  06/01/1955  DATE OF ADMISSION:  08/18/2015 ADMITTING PHYSICIAN: Katha HammingSnehalatha Konidena, MD  DATE OF DISCHARGE: 08/19/2015 11:15 AM  PRIMARY CARE PHYSICIAN: Dennison MascotLemont Morrisey, MD    ADMISSION DIAGNOSIS:  Vertigo [R42] Troponin level elevated [R79.89]  DISCHARGE DIAGNOSIS:  Active Problems:   Dizziness   SECONDARY DIAGNOSIS:   Past Medical History  Diagnosis Date  . Hypertension   . Depression   . Obesity   . Sleep apnea   . Arthritis   . Morbid obesity with BMI of 45.0-49.9, adult [facility](HCC)     HOSPITAL COURSE:   1. Benign positional vertigo. Patient was admitted with room spinning to the left. Now she is feeling much better and walked with physical therapy. I put the patient through Epley maneuver and she did well with that. No ear infection seen. Patient already has meclizine at home. Stable for discharge home. 2. Hypokalemia replace potassium. Discontinue hydrochlorothiazide 3. Essential hypertension. Discontinue hydrochlorothiazide continue other medications. 4. Obesity sleep apnea weight loss needed 5. Depression continue current psychiatric medication 6. Borderline elevated troponin with no complaints of chest pain or shortness of breath no further workup.  DISCHARGE CONDITIONS:   Satisfactory  CONSULTS OBTAINED:  None  DRUG ALLERGIES:  No Known Allergies  DISCHARGE MEDICATIONS:   Discharge Medication List as of 08/19/2015 11:36 AM    START taking these medications   Details  benazepril (LOTENSIN) 20 MG tablet Take 1 tablet (20 mg total) by mouth daily., Starting 08/19/2015, Until Discontinued, Print      CONTINUE these medications which have NOT CHANGED   Details  ALPRAZolam (XANAX) 0.5 MG tablet Take 1 tablet (0.5 mg total) by mouth every 12 (twelve) hours as needed., Starting 04/05/2015, Until Discontinued, Print    aspirin EC 81 MG  tablet Take 81 mg by mouth daily., Until Discontinued, Historical Med    fexofenadine (ALLEGRA) 180 MG tablet Take 180 mg by mouth daily., Until Discontinued, Historical Med    furosemide (LASIX) 20 MG tablet Take 1 tablet (20 mg total) by mouth 2 (two) times daily., Starting 06/14/2015, Until Discontinued, Normal    glucose blood test strip Use as instructed, Normal    hydrALAZINE (APRESOLINE) 25 MG tablet Take 1 tablet (25 mg total) by mouth 2 (two) times daily., Starting 07/19/2015, Until Discontinued, Normal    meclizine (ANTIVERT) 25 MG tablet Take 1 tablet (25 mg total) by mouth every 8 (eight) hours as needed for dizziness., Starting 11/14/2014, Until Discontinued, Normal    meloxicam (MOBIC) 15 MG tablet Take 1 tablet (15 mg total) by mouth daily., Starting 04/05/2015, Until Discontinued, Normal    RESTASIS 0.05 % ophthalmic emulsion INSTILL 1 DROP INTO BOTH EYES TWICE A DAY., Historical Med    triamcinolone (NASACORT) 55 MCG/ACT AERO nasal inhaler Place 2 sprays into the nose daily., Until Discontinued, Historical Med    Triamcinolone Acetonide (TRIAMCINOLONE 0.1 % CREAM : EUCERIN) CREA Apply 1 application topically 2 (two) times daily., Starting 04/24/2015, Until Discontinued, Print    venlafaxine XR (EFFEXOR-XR) 150 MG 24 hr capsule TAKE 1 CAPSULE DAILY, Normal      STOP taking these medications     benazepril-hydrochlorthiazide (LOTENSIN HCT) 20-25 MG tablet          DISCHARGE INSTRUCTIONS:   Follow-up PMD one week  If you experience worsening of your admission symptoms, develop shortness of breath, life threatening  emergency, suicidal or homicidal thoughts you must seek medical attention immediately by calling 911 or calling your MD immediately  if symptoms less severe.  You Must read complete instructions/literature along with all the possible adverse reactions/side effects for all the Medicines you take and that have been prescribed to you. Take any new Medicines after  you have completely understood and accept all the possible adverse reactions/side effects.   Please note  You were cared for by a hospitalist during your hospital stay. If you have any questions about your discharge medications or the care you received while you were in the hospital after you are discharged, you can call the unit and asked to speak with the hospitalist on call if the hospitalist that took care of you is not available. Once you are discharged, your primary care physician will handle any further medical issues. Please note that NO REFILLS for any discharge medications will be authorized once you are discharged, as it is imperative that you return to your primary care physician (or establish a relationship with a primary care physician if you do not have one) for your aftercare needs so that they can reassess your need for medications and monitor your lab values.    Today   CHIEF COMPLAINT:   Chief Complaint  Patient presents with  . Dizziness    HISTORY OF PRESENT ILLNESS:  Regina Christensen  is a 60 y.o. female with a known history of Vertigo presents with room spinning to the left.   VITAL SIGNS:  Blood pressure 136/69, pulse 83, temperature 97.6 F (36.4 C), temperature source Oral, resp. rate 17, height  (1.676 m), weight 127.642 kg (281 lb 6.4 oz), SpO2 97 %.    PHYSICAL EXAMINATION:  GENERAL:  60 y.o.-year-old patient lying in the bed with no acute distress.  EYES: Pupils equal, round, reactive to light and accommodation. No scleral icterus. Extraocular muscles intact. Tympanic membrane bilaterally no erythema but slight bulging. HEENT: Head atraumatic, normocephalic. Oropharynx and nasopharynx clear.  NECK:  Supple, no jugular venous distention. No thyroid enlargement, no tenderness.  LUNGS: Normal breath sounds bilaterally, no wheezing, rales,rhonchi or crepitation. No use of accessory muscles of respiration.  CARDIOVASCULAR: S1, S2 normal. No murmurs,  rubs, or gallops.  ABDOMEN: Soft, non-tender, non-distended. Bowel sounds present. No organomegaly or mass.  EXTREMITIES: Trace edema, no cyanosis, or clubbing.  NEUROLOGIC: Cranial nerves II through XII are intact. Muscle strength 5/5 in all extremities. Sensation intact. Gait not checked. Patient put through Epley maneuver without a problem. PSYCHIATRIC: The patient is alert and oriented x 3.  SKIN: No obvious rash, lesion, or ulcer.   DATA REVIEW:   CBC  Recent Labs Lab 08/19/15 0445  WBC 9.2  HGB 12.2  HCT 37.4  PLT 235    Chemistries   Recent Labs Lab 08/18/15 1514 08/19/15 0445  NA 141 142  K 3.2* 3.3*  CL 105 107  CO2 27 28  GLUCOSE 143* 100*  BUN 11 10  CREATININE 0.80 0.66  CALCIUM 8.8* 8.6*  AST 25  --   ALT 17  --   ALKPHOS 71  --   BILITOT 0.4  --     Cardiac Enzymes  Recent Labs Lab 08/18/15 1821  TROPONINI 0.06*     RADIOLOGY:  Ct Head Wo Contrast  08/18/2015  CLINICAL DATA:  Vertigo. EXAM: CT HEAD WITHOUT CONTRAST TECHNIQUE: Contiguous axial images were obtained from the base of the skull through the vertex without intravenous contrast. COMPARISON:  Brain MRI dated 04/08/2011. FINDINGS: Brain: No evidence of acute infarction, hemorrhage, extra-axial collection, ventriculomegaly, or mass effect. Vascular: No hyperdense vessel or unexpected calcification. Skull: Negative for fracture or focal lesion. Sinuses/Orbits: No acute findings. Other: None. IMPRESSION: Negative head CT. Electronically Signed   By: Bary Terek Bee M.D.   On: 08/18/2015 17:23   Dg Chest Portable 1 View  08/18/2015  CLINICAL DATA:  60 year old female with history of vertigo. EXAM: PORTABLE CHEST 1 VIEW COMPARISON:  Chest x-ray 11/21/2011. FINDINGS: Lung volumes are low. No consolidative airspace disease. No pleural effusions. No pneumothorax. No pulmonary nodule or mass noted. Pulmonary vasculature and the cardiomediastinal silhouette are within normal limits. IMPRESSION: 1. Low  lung volumes without radiographic evidence of acute cardiopulmonary disease. Electronically Signed   By: Trudie Reed M.D.   On: 08/18/2015 17:02    Management plans discussed with the patient, family and they are in agreement.  CODE STATUS:     Code Status Orders        Start     Ordered   08/18/15 1959  Full code   Continuous     08/18/15 2000    Code Status History    Date Active Date Inactive Code Status Order ID Comments User Context   This patient has a current code status but no historical code status.      TOTAL TIME TAKING CARE OF THIS PATIENT: 35 minutes.    Alford Highland M.D on 08/19/2015 at 2:14 PM  Between 7am to 6pm - Pager - [phone]  After 6pm go to [email or web address] - password [email or web address]  [employer] Office  [phone]  CC: Primary care physician; Dennison Mascot, MD

## 2015-08-19 NOTE — Progress Notes (Signed)
0830 - Patient assessed. No complaints at present. Slight headache - but was medicated earlier - does not want any treatment at present. No complaints of dizziness at present. Family in room. Vital signs stable.

## 2015-08-19 NOTE — Evaluation (Signed)
Physical Therapy Evaluation Patient Details Name: Regina Christensen MRN: [id number] DOB: 04/20/1956 Today's Date: 08/19/2015   History of Present Illness  Regina Christensen is a 60 y.o. female with a known history of hypertension, depression, morbid obesity came in because of dizziness. Patient felt the room is spinning , has some nausea. No double vision, no weakness. Patient has a history of positional vertigo. She reports no dizziness and no headache during PT evaluation.   Clinical Impression  60 yo Female came to ED with increased dizziness. Patient reports feeling the room spinning and feeling as though she was having vertigo when she came to the hospital. However today she feels better, little to no dizziness and no headache. She was living at home with her daughters prior to admittance. She was independent in all self care ADLs and was able to negotiate 24 steps to get to 2nd floor apartment. Currently Patient demonstrates good strength in BLE. She is independent in bed mobility, transfers and gait tasks. She was able to negotiate 12 steps with B rail without difficulty, forward reciprocal with good safety and technique. Patient demonstrates good balance being able to keep balance for a few seconds with SLS, stand for 10 sec with eyes closed without loss of balance. She reports functioning at her PLOF and would not benefit from any additional skilled PT intervention at this time. All needs addressed at evaluation.     Follow Up Recommendations No PT follow up    Equipment Recommendations  None recommended by PT    Recommendations for Other Services       Precautions / Restrictions Precautions Precautions: None Restrictions Weight Bearing Restrictions: No      Mobility  Bed Mobility Overal bed mobility: Independent             General bed mobility comments: flat surface, no bed rails able to complete supine<>sitting independently; Patient denies any dizziness upon sitting  up;   Transfers Overall transfer level: Independent               General transfer comment: sit<>stand from bed independent without AD demonstrating good safety;   Ambulation/Gait Ambulation/Gait assistance: Independent Ambulation Distance (Feet): 200 Feet Assistive device: None Gait Pattern/deviations: WFL(Within Functional Limits);Step-through pattern Gait velocity: Does ambulate at slower gait speed, but reports that this is baseline (2.12 ft/sec)   General Gait Details: patient ambulates with wide base of support, reciprocal gait pattern, good foot clearance; She demonstrates good balance without stagger or veering;   Stairs Stairs: Yes Stairs assistance: Modified independent (Device/Increase time) Stair Management: Two rails;Alternating pattern Number of Stairs: 12 General stair comments: good safety, good foot clearance; mod I for increased time and requires bed rail;   Wheelchair Mobility    Modified Rankin (Stroke Patients Only)       Balance Overall balance assessment: Needs assistance Sitting-balance support: No upper extremity supported;Feet supported Sitting balance-Leahy Scale: Normal Sitting balance - Comments: normal sitting balance without trunk lean;    Standing balance support: No upper extremity supported Standing balance-Leahy Scale: Good   Single Leg Stance - Right Leg: 2 Single Leg Stance - Left Leg: 3           High Level Balance Comments: requires 1 HHA for tandem stance; reports having trouble with SLS and tandem stance due to history of knee pain/arthritis;              Pertinent Vitals/Pain Pain Assessment: No/denies pain (reports no headache this AM)  Home Living Family/patient expects to be discharged to:: Private residence Living Arrangements: Children Available Help at Discharge: Family Type of Home: Apartment Home Access: Stairs to enter Entrance Stairs-Rails: Right;Left;Can reach both Secretary/administrator of  Steps: 24 Home Layout: One level Home Equipment: None      Prior Function Level of Independence: Independent         Comments: Patient was independent in all self care ADLs prior to admittance; she does report decreased mobility when feeling dizzy, but denies any recent falls;      Hand Dominance   Dominant Hand: Right    Extremity/Trunk Assessment   Upper Extremity Assessment: Defer to OT evaluation           Lower Extremity Assessment: Overall WFL for tasks assessed      Cervical / Trunk Assessment: Normal  Communication   Communication: No difficulties  Cognition Arousal/Alertness: Awake/alert Behavior During Therapy: WFL for tasks assessed/performed Overall Cognitive Status: Within Functional Limits for tasks assessed                      General Comments General comments (skin integrity, edema, etc.): good skin integrity;     Exercises        Assessment/Plan    PT Assessment Patent does not need any further PT services  PT Diagnosis Difficulty walking   PT Problem List    PT Treatment Interventions     PT Goals (Current goals can be found in the Care Plan section) Acute Rehab PT Goals Patient Stated Goal: "I want to go home" PT Goal Formulation: With patient Time For Goal Achievement: 08/19/15 Potential to Achieve Goals: Good    Frequency     Barriers to discharge        Co-evaluation               End of Session Equipment Utilized During Treatment: Gait belt Activity Tolerance: Patient tolerated treatment well Patient left: in bed;with family/visitor present      Functional Assessment Tool Used: gait speed (2.12 ft/sec), clinical judgement, strength Functional Limitation: Mobility: Walking and moving around Mobility: Walking and Moving Around Current Status (W0981): At least 1 percent but less than 20 percent impaired, limited or restricted Mobility: Walking and Moving Around Goal Status [phone]): At least 1 percent but  less than 20 percent impaired, limited or restricted Mobility: Walking and Moving Around Discharge Status [phone]): At least 1 percent but less than 20 percent impaired, limited or restricted    Time: 0954-1005 PT Time Calculation (min) (ACUTE ONLY): 11 min   Charges:   PT Evaluation $PT Eval Low Complexity: 1 Procedure     PT G Codes:   PT G-Codes **NOT FOR INPATIENT CLASS** Functional Assessment Tool Used: gait speed (2.12 ft/sec), clinical judgement, strength Functional Limitation: Mobility: Walking and moving around Mobility: Walking and Moving Around Current Status (O1308): At least 1 percent but less than 20 percent impaired, limited or restricted Mobility: Walking and Moving Around Goal Status [phone]): At least 1 percent but less than 20 percent impaired, limited or restricted Mobility: Walking and Moving Around Discharge Status [phone]): At least 1 percent but less than 20 percent impaired, limited or restricted    Trotter,Margaret PT, DPT 08/19/2015, 10:20 AM

## 2015-08-19 NOTE — Progress Notes (Signed)
Patient ID: Regina Christensen, female   DOB: 08/07/1955, 60 y.o.   MRN: [id number] [facility] [place] [facility]        Brentlee Cheree DittoGraham was admitted to the Hospital on 08/18/2015 and Discharged  08/19/2015 and should be excused from work/school   for 3 days starting 08/18/2015 , may return to work/school without any restrictions.  Alford HighlandWIETING, Othelia Riederer M.D on 08/19/2015,at 10:37 AM  [facility] [place] [facility]    Office  [phone]

## 2015-08-28 ENCOUNTER — Ambulatory Visit (INDEPENDENT_AMBULATORY_CARE_PROVIDER_SITE_OTHER): Payer: BC Managed Care – PPO | Admitting: Family Medicine

## 2015-08-28 ENCOUNTER — Encounter: Payer: Self-pay | Admitting: Family Medicine

## 2015-08-28 VITALS — HR 102 | Temp 97.9°F | Resp 16 | Ht 66.0 in | Wt 287.5 lb

## 2015-08-28 DIAGNOSIS — H8110 Benign paroxysmal vertigo, unspecified ear: Secondary | ICD-10-CM | POA: Insufficient documentation

## 2015-08-28 DIAGNOSIS — I10 Essential (primary) hypertension: Secondary | ICD-10-CM | POA: Diagnosis not present

## 2015-08-28 DIAGNOSIS — E876 Hypokalemia: Secondary | ICD-10-CM

## 2015-08-28 DIAGNOSIS — R7989 Other specified abnormal findings of blood chemistry: Secondary | ICD-10-CM | POA: Diagnosis not present

## 2015-08-28 DIAGNOSIS — H8112 Benign paroxysmal vertigo, left ear: Secondary | ICD-10-CM

## 2015-08-28 DIAGNOSIS — R778 Other specified abnormalities of plasma proteins: Secondary | ICD-10-CM

## 2015-08-28 DIAGNOSIS — R739 Hyperglycemia, unspecified: Secondary | ICD-10-CM

## 2015-08-28 DIAGNOSIS — R600 Localized edema: Secondary | ICD-10-CM

## 2015-08-28 MED ORDER — POTASSIUM CHLORIDE CRYS ER 20 MEQ PO TBCR: 20.0000 meq | EXTENDED_RELEASE_TABLET | Freq: Every day | ORAL | Status: DC

## 2015-08-28 MED ORDER — FUROSEMIDE 20 MG PO TABS: 20.0000 mg | ORAL_TABLET | Freq: Two times a day (BID) | ORAL | Status: DC

## 2015-08-28 MED ORDER — BENAZEPRIL HCL 20 MG PO TABS: 20.0000 mg | ORAL_TABLET | Freq: Every day | ORAL | Status: DC

## 2015-08-28 MED ORDER — HYDRALAZINE HCL 25 MG PO TABS: 25.0000 mg | ORAL_TABLET | Freq: Two times a day (BID) | ORAL | Status: DC

## 2015-08-28 NOTE — Progress Notes (Signed)
Name: Regina Christensen   MRN: [id number]    DOB: April 14, 1956   Date:08/28/2015       Progress Note  Subjective  Chief Complaint  Chief Complaint  Patient presents with  . Follow-up    patient is here for a ER f/u  . Dizziness    patient presents with vertigo for the past couple of weeks. patient stated that she had a really bad episode while at a restaurant.  . Ear Pain    patient has some sharp pains in the right ear.    HPI  Vertigo: she states she has a history of intermittent vertigo with head movement in the past, but about 10 days ago she was at a restaurant and symptoms were intense, spinning sensation and nausea. She went to [facility] and potassium was low and troponin slightly up times 2. She had Epley maneuver and symptoms resolved. She has been asymptomatic, but has noticed mild pain on right ear now.   HTN: she was on Lotensin/hctz but because of hypokalemia, switched to lotensin at [facility] but did not take medication yet. Also advised to take potassium with lasix. BP is at goal, we will recheck labs  Elevated Troponin, could not open EKG done at [facility]. She denies chest pain, SOB but has risk factors and we will refer to cardiologist.   Major Depression with some anxiety: taking Effexor , she has stress with her main job, also works a part time job. Taking Alprazolam prn . No crying spells or mood swings, denies suicidal thoughts.   Patient Active Problem List   Diagnosis Date Noted  . BPPV (benign paroxysmal positional vertigo) 08/28/2015  . Dizziness 08/18/2015  . Hyperlipidemia 11/10/2014  . Essential hypertension 11/09/2014  . Hyperglycemia 11/09/2014  . Acute anxiety 11/09/2014  . Acanthosis nigricans 11/09/2014  . Obesity 11/09/2014  . Morbid obesity ([place]) 01/06/2012  . Left breast mass 01/06/2012    Past Surgical History  Procedure Laterality Date  . Abdominal hysterectomy    . Endometrial ablation      x2  . Gastric bypass      sleeve    Family History  Problem  Relation Age of Onset  . Heart disease Father   . Cancer Brother     Social History   Social History  . Marital Status: Divorced    Spouse Name: N/A  . Number of Children: N/A  . Years of Education: N/A   Occupational History  . Not on file.   Social History Main Topics  . Smoking status: Never Smoker   . Smokeless tobacco: Never Used  . Alcohol Use: No     Comment: rare  . Drug Use: No  . Sexual Activity: No   Other Topics Concern  . Not on file   Social History Narrative     Current outpatient prescriptions:  .  ALPRAZolam (XANAX) 0.5 MG tablet, Take 1 tablet (0.5 mg total) by mouth every 12 (twelve) hours as needed., Disp: 60 tablet, Rfl: 2 .  aspirin EC 81 MG tablet, Take 81 mg by mouth daily., Disp: , Rfl:  .  benazepril (LOTENSIN) 20 MG tablet, Take 1 tablet (20 mg total) by mouth daily., Disp: 30 tablet, Rfl: 2 .  fexofenadine (ALLEGRA) 180 MG tablet, Take 180 mg by mouth daily., Disp: , Rfl:  .  furosemide (LASIX) 20 MG tablet, Take 1 tablet (20 mg total) by mouth 2 (two) times daily., Disp: 60 tablet, Rfl: 2 .  glucose blood test  strip, Use as instructed, Disp: 100 each, Rfl: 12 .  hydrALAZINE (APRESOLINE) 25 MG tablet, Take 1 tablet (25 mg total) by mouth 2 (two) times daily., Disp: 60 tablet, Rfl: 2 .  meclizine (ANTIVERT) 25 MG tablet, Take 1 tablet (25 mg total) by mouth every 8 (eight) hours as needed for dizziness., Disp: 30 tablet, Rfl: 1 .  meloxicam (MOBIC) 15 MG tablet, Take 1 tablet (15 mg total) by mouth daily., Disp: 30 tablet, Rfl: 5 .  potassium chloride SA (K-DUR,KLOR-CON) 20 MEQ tablet, Take 1 tablet (20 mEq total) by mouth daily. With lasix, Disp: 60 tablet, Rfl: 2 .  RESTASIS 0.05 % ophthalmic emulsion, INSTILL 1 DROP INTO BOTH EYES TWICE A DAY., Disp: , Rfl: 4 .  triamcinolone (NASACORT) 55 MCG/ACT AERO nasal inhaler, Place 2 sprays into the nose daily., Disp: , Rfl:  .  Triamcinolone Acetonide (TRIAMCINOLONE 0.1 % CREAM : EUCERIN) CREA, Apply  1 application topically 2 (two) times daily., Disp: 1 each, Rfl: 5 .  venlafaxine XR (EFFEXOR-XR) 150 MG 24 hr capsule, TAKE 1 CAPSULE DAILY, Disp: 30 capsule, Rfl: 5  No Known Allergies   ROS  Ten systems reviewed and is negative except as mentioned in HPI   Objective  Filed Vitals:   08/28/15 1502  Pulse: 102  Temp: 97.9 F (36.6 C)  TempSrc: Oral  Resp: 16  Height: '5\' 6"'  (1.676 m)  Weight: 287 lb 8 oz (130.409 kg)  SpO2: 96%    Body mass index is 46.43 kg/(m^2).  Physical Exam  Constitutional: Patient appears well-developed and well-nourished. Obese No distress.  HEENT: head atraumatic, normocephalic, pupils equal and reactive to light, ears normal TM bilaterally  neck supple, throat within normal limits Cardiovascular: Normal rate, regular rhythm and normal heart sounds.  No murmur heard. No BLE edema. Pulmonary/Chest: Effort normal and breath sounds normal. No respiratory distress. Abdominal: Soft.  There is no tenderness. Psychiatric: Patient has a normal mood and affect. behavior is normal. Judgment and thought content normal. Neurological exam: no focal deficit, no nystagmus, normal strenght  Recent Results (from the past 2160 hour(s))  CBC with Differential     Status: Abnormal   Collection Time: 08/18/15  3:14 PM  Result Value Ref Range   WBC 12.4 (H) 3.6 - 11.0 K/uL   RBC 4.40 3.80 - 5.20 MIL/uL   Hemoglobin 12.6 12.0 - 16.0 g/dL   HCT 37.5 35.0 - 47.0 %   MCV 85.1 80.0 - 100.0 fL   MCH 28.6 26.0 - 34.0 pg   MCHC 33.6 32.0 - 36.0 g/dL   RDW 16.2 (H) 11.5 - 14.5 %   Platelets 267 150 - 440 K/uL   Neutrophils Relative % 78 %   Lymphocytes Relative 13 %   Monocytes Relative 6 %   Eosinophils Relative 2 %   Basophils Relative 1 %   Neutro Abs 9.8 (H) 1.4 - 6.5 K/uL   Lymphs Abs 1.6 1.0 - 3.6 K/uL   Monocytes Absolute 0.7 0.2 - 0.9 K/uL   Eosinophils Absolute 0.2 0 - 0.7 K/uL   Basophils Absolute 0.1 0 - 0.1 K/uL   RBC Morphology MIXED RBC POPULATION    Comprehensive metabolic panel     Status: Abnormal   Collection Time: 08/18/15  3:14 PM  Result Value Ref Range   Sodium 141 135 - 145 mmol/L   Potassium 3.2 (L) 3.5 - 5.1 mmol/L    Comment: HEMOLYSIS AT THIS LEVEL MAY AFFECT RESULT   Chloride 105  101 - 111 mmol/L   CO2 27 22 - 32 mmol/L   Glucose, Bld 143 (H) 65 - 99 mg/dL   BUN 11 6 - 20 mg/dL   Creatinine, Ser 0.80 0.44 - 1.00 mg/dL   Calcium 8.8 (L) 8.9 - 10.3 mg/dL   Total Protein 7.0 6.5 - 8.1 g/dL   Albumin 3.7 3.5 - 5.0 g/dL   AST 25 15 - 41 U/L   ALT 17 14 - 54 U/L   Alkaline Phosphatase 71 38 - 126 U/L   Total Bilirubin 0.4 0.3 - 1.2 mg/dL   GFR calc non Af Amer >60 >60 mL/min   GFR calc Af Amer >60 >60 mL/min    Comment: (NOTE) The eGFR has been calculated using the CKD EPI equation. This calculation has not been validated in all clinical situations. eGFR's persistently <60 mL/min signify possible Chronic Kidney Disease.    Anion gap 9 5 - 15  Troponin I     Status: Abnormal   Collection Time: 08/18/15  3:14 PM  Result Value Ref Range   Troponin I 0.06 (H) <0.031 ng/mL    Comment: READ BACK AND VERIFIED WITH ANGELA ROBBINS ON 08/18/15 AT 16[address] [place]        PERSISTENTLY INCREASED TROPONIN VALUES IN THE RANGE OF 0.04-0.49 ng/mL CAN BE SEEN IN:       -UNSTABLE ANGINA       -CONGESTIVE HEART FAILURE       -MYOCARDITIS       -CHEST TRAUMA       -ARRYHTHMIAS       -LATE PRESENTING MYOCARDIAL INFARCTION       -COPD   CLINICAL FOLLOW-UP RECOMMENDED.   Troponin I     Status: Abnormal   Collection Time: 08/18/15  6:21 PM  Result Value Ref Range   Troponin I 0.06 (H) <0.031 ng/mL    Comment: PREVIOUS RESULT CALLED BY [facility] AT 4128 ON 08/18/15 RWW        PERSISTENTLY INCREASED TROPONIN VALUES IN THE RANGE OF 0.04-0.49 ng/mL CAN BE SEEN IN:       -UNSTABLE ANGINA       -CONGESTIVE HEART FAILURE       -MYOCARDITIS       -CHEST TRAUMA       -ARRYHTHMIAS       -LATE PRESENTING MYOCARDIAL INFARCTION       -COPD    CLINICAL FOLLOW-UP RECOMMENDED.   Basic metabolic panel     Status: Abnormal   Collection Time: 08/19/15  4:45 AM  Result Value Ref Range   Sodium 142 135 - 145 mmol/L   Potassium 3.3 (L) 3.5 - 5.1 mmol/L   Chloride 107 101 - 111 mmol/L   CO2 28 22 - 32 mmol/L   Glucose, Bld 100 (H) 65 - 99 mg/dL   BUN 10 6 - 20 mg/dL   Creatinine, Ser 0.66 0.44 - 1.00 mg/dL   Calcium 8.6 (L) 8.9 - 10.3 mg/dL   GFR calc non Af Amer >60 >60 mL/min   GFR calc Af Amer >60 >60 mL/min    Comment: (NOTE) The eGFR has been calculated using the CKD EPI equation. This calculation has not been validated in all clinical situations. eGFR's persistently <60 mL/min signify possible Chronic Kidney Disease.    Anion gap 7 5 - 15  CBC     Status: Abnormal   Collection Time: 08/19/15  4:45 AM  Result Value Ref Range   WBC 9.2 3.6 - 11.0  K/uL   RBC 4.28 3.80 - 5.20 MIL/uL   Hemoglobin 12.2 12.0 - 16.0 g/dL   HCT 37.4 35.0 - 47.0 %   MCV 87.4 80.0 - 100.0 fL   MCH 28.6 26.0 - 34.0 pg   MCHC 32.7 32.0 - 36.0 g/dL   RDW 16.2 (H) 11.5 - 14.5 %   Platelets 235 150 - 440 K/uL  Glucose, capillary     Status: None   Collection Time: 08/19/15  7:53 AM  Result Value Ref Range   Glucose-Capillary 86 65 - 99 mg/dL   PHQ2/9: Depression screen [facility] 2/9 08/28/2015 04/05/2015 11/09/2014  Decreased Interest 0 0 1  Down, Depressed, Hopeless 0 0 0  PHQ - 2 Score 0 0 1    Fall Risk: Fall Risk  08/28/2015 04/05/2015 11/09/2014  Falls in the past year? No No Yes  Number falls in past yr: - - 1  Injury with Fall? - - Yes     Functional Status Survey: Is the patient deaf or have difficulty hearing?: No Does the patient have difficulty seeing, even when wearing glasses/contacts?: No Does the patient have difficulty concentrating, remembering, or making decisions?: No Does the patient have difficulty walking or climbing stairs?: Yes Does the patient have difficulty dressing or bathing?: Yes Does the patient have difficulty  doing errands alone such as visiting a doctor's office or shopping?: No    Assessment & Plan  1. BPPV (benign paroxysmal positional vertigo), left  resolved  2. Elevated troponin  - Ambulatory referral to Cardiology  3. Hyperglycemia  Last hgbA1C was good  4. Essential hypertension  - benazepril (LOTENSIN) 20 MG tablet; Take 1 tablet (20 mg total) by mouth daily.  Dispense: 30 tablet; Refill: 2 - hydrALAZINE (APRESOLINE) 25 MG tablet; Take 1 tablet (25 mg total) by mouth 2 (two) times daily.  Dispense: 60 tablet; Refill: 2  5. Edema of both legs  - furosemide (LASIX) 20 MG tablet; Take 1 tablet (20 mg total) by mouth 2 (two) times daily.  Dispense: 60 tablet; Refill: 2 - potassium chloride SA (K-DUR,KLOR-CON) 20 MEQ tablet; Take 1 tablet (20 mEq total) by mouth daily. With lasix  Dispense: 60 tablet; Refill: 2  6. Hypokalemia  - Potassium

## 2015-08-30 ENCOUNTER — Telehealth: Payer: Self-pay | Admitting: Family Medicine

## 2015-08-30 NOTE — Telephone Encounter (Signed)
Patient is checking status on her form for her handicap sticker.

## 2015-09-06 NOTE — Telephone Encounter (Signed)
Patient need appointment to come in to discuss per Dr. Carlynn PurlSowles. Ms. Charna ElizabethGrahams daughter in law notified

## 2015-09-14 ENCOUNTER — Telehealth: Payer: Self-pay | Admitting: Family Medicine

## 2015-09-14 NOTE — Telephone Encounter (Signed)
Patient is checking status on her referral to cardiologist. States no one has contacted her.

## 2015-09-14 NOTE — Telephone Encounter (Signed)
Tiffany informed patient.

## 2015-09-24 ENCOUNTER — Other Ambulatory Visit: Payer: Self-pay | Admitting: Family Medicine

## 2015-09-24 DIAGNOSIS — Z1231 Encounter for screening mammogram for malignant neoplasm of breast: Secondary | ICD-10-CM

## 2015-10-08 ENCOUNTER — Ambulatory Visit
Admission: RE | Admit: 2015-10-08 | Discharge: 2015-10-08 | Disposition: A | Discharge status: Home / Self Care | Payer: BC Managed Care – PPO | Source: Ambulatory Visit | Attending: Family Medicine | Admitting: Family Medicine

## 2015-10-08 DIAGNOSIS — Z1231 Encounter for screening mammogram for malignant neoplasm of breast: Secondary | ICD-10-CM

## 2015-10-11 ENCOUNTER — Other Ambulatory Visit: Payer: Self-pay | Admitting: Family Medicine

## 2015-10-11 DIAGNOSIS — R928 Other abnormal and inconclusive findings on diagnostic imaging of breast: Secondary | ICD-10-CM

## 2015-10-18 ENCOUNTER — Ambulatory Visit
Admission: RE | Admit: 2015-10-18 | Discharge: 2015-10-18 | Disposition: A | Discharge status: Home / Self Care | Payer: BC Managed Care – PPO | Source: Ambulatory Visit | Attending: Family Medicine | Admitting: Family Medicine

## 2015-10-18 DIAGNOSIS — R928 Other abnormal and inconclusive findings on diagnostic imaging of breast: Secondary | ICD-10-CM

## 2015-10-22 ENCOUNTER — Other Ambulatory Visit: Payer: Self-pay

## 2015-10-22 DIAGNOSIS — G8929 Other chronic pain: Secondary | ICD-10-CM

## 2015-10-22 DIAGNOSIS — M25561 Pain in right knee: Principal | ICD-10-CM

## 2015-10-22 NOTE — Telephone Encounter (Signed)
Patient requesting refill. 

## 2015-10-23 MED ORDER — MELOXICAM 15 MG PO TABS: 15.0000 mg | ORAL_TABLET | Freq: Every day | ORAL | Status: DC

## 2015-10-26 ENCOUNTER — Encounter: Payer: Self-pay | Admitting: Cardiovascular Disease

## 2015-10-26 ENCOUNTER — Ambulatory Visit (INDEPENDENT_AMBULATORY_CARE_PROVIDER_SITE_OTHER): Payer: BC Managed Care – PPO | Admitting: Cardiovascular Disease

## 2015-10-26 VITALS — BP 138/78 | HR 86 | Ht 68.0 in | Wt 293.0 lb

## 2015-10-26 DIAGNOSIS — R0602 Shortness of breath: Secondary | ICD-10-CM

## 2015-10-26 NOTE — Patient Instructions (Addendum)
Medication Instructions:  Your physician recommends that you continue on your current medications as directed. Please refer to the Current Medication list given to you today.   Labwork: none  Testing/Procedures: Your physician has requested that you have a lexiscan myoview. For further information please visit [email or web address]. Please follow instruction sheet, as given.  [facility] MYOVIEW  Your caregiver has ordered a Stress Test with nuclear imaging. The purpose of this test is to evaluate the blood supply to your heart muscle. This procedure is referred to as a "Non-Invasive Stress Test." This is because other than having an IV started in your vein, nothing is inserted or "invades" your body. Cardiac stress tests are done to find areas of poor blood flow to the heart by determining the extent of coronary artery disease (CAD). Some patients exercise on a treadmill, which naturally increases the blood flow to your heart, while others who are  unable to walk on a treadmill due to physical limitations have a pharmacologic/chemical stress agent called Lexiscan . This medicine will mimic walking on a treadmill by temporarily increasing your coronary blood flow.   Please note: these test may take anywhere between 2-4 hours to complete  [facility]  THE VOLUNTEERS AT THE FIRST DESK WILL DIRECT YOU WHERE TO GO  Date of Procedure: July 20 and 21  Arrival Time for Procedure: 7:45am  Instructions regarding medication:    _xx___:  Hold other medications as follows: Lasix the morning of your test  PLEASE NOTIFY THE OFFICE AT LEAST 24 HOURS IN ADVANCE IF YOU ARE UNABLE TO KEEP YOUR APPOINTMENT.  [phone] AND  PLEASE NOTIFY [facility] AT LEAST 24 HOURS IN ADVANCE IF YOU ARE UNABLE TO KEEP YOUR APPOINTMENT. [phone]  How to prepare for your Myoview test:   Do not eat or drink after midnight  No caffeine for 24 hours prior to test  No smoking  24 hours prior to test.  Your medication may be taken with water.  If your doctor stopped a medication because of this test, do not take that medication.  Ladies, please do not wear dresses.  Skirts or pants are appropriate. Please wear a short sleeve shirt.  No perfume, cologne or lotion.  Wear comfortable walking shoes. No heels!            Follow-Up: Your physician recommends that you schedule a follow-up appointment as needed.   Any Other Special Instructions Will Be Listed Below (If Applicable).     If you need a refill on your cardiac medications before your next appointment, please call your pharmacy.  Cardiac Nuclear Scanning A cardiac nuclear scan is used to check your heart for problems, such as the following:  A portion of the heart is not getting enough blood.  Part of the heart muscle has died, which happens with a heart attack.  The heart wall is not working normally.  In this test, a radioactive dye (tracer) is injected into your bloodstream. After the tracer has traveled to your heart, a scanning device is used to measure how much of the tracer is absorbed by or distributed to various areas of your heart. LET [facility] KNOW ABOUT:  Any allergies you have.  All medicines you are taking, including vitamins, herbs, eye drops, creams, and over-the-counter medicines.  Previous problems you or members of your family have had with the use of anesthetics.  Any blood disorders you have.  Previous surgeries you have  had.  Medical conditions you have.  RISKS AND COMPLICATIONS Generally, this is a safe procedure. However, as with any procedure, problems can occur. Possible problems include:   Serious chest pain.  Rapid heartbeat.  Sensation of warmth in your chest. This usually passes quickly. BEFORE THE PROCEDURE Ask your health care provider about changing or stopping your regular medicines. PROCEDURE This procedure is usually done  at a hospital and takes 2-4 hours.  An IV tube is inserted into one of your veins.  Your health care provider will inject a small amount of radioactive tracer through the tube.  You will then wait for 20-40 minutes while the tracer travels through your bloodstream.  You will lie down on an exam table so images of your heart can be taken. Images will be taken for about 15-20 minutes.  You will exercise on a treadmill or stationary bike. While you exercise, your heart activity will be monitored with an electrocardiogram (ECG), and your blood pressure will be checked.  If you are unable to exercise, you may be given a medicine to make your heart beat faster.  When blood flow to your heart has peaked, tracer will again be injected through the IV tube.  After 20-40 minutes, you will get back on the exam table and have more images taken of your heart.  When the procedure is over, your IV tube will be removed. AFTER THE PROCEDURE  You will likely be able to leave shortly after the test. Unless your health care provider tells you otherwise, you may return to your normal schedule, including diet, activities, and medicines.  Make sure you find out how and when you will get your test results.   This information is not intended to replace advice given to you by your health care provider. Make sure you discuss any questions you have with your health care provider.   Document Released: 05/02/2004 Document Revised: 04/12/2013 Document Reviewed: 03/16/2013 Elsevier Interactive Patient Education [employer].

## 2015-10-26 NOTE — Progress Notes (Signed)
Cardiology Office Note   Date:  10/26/2015   ID:  Regina Christensen, DOB 01/13/1956, MRN [id number]  PCP:  Dennison MascotLemont Morrisey, MD  Cardiologist:   Lorine BearsMuhammad Arida, MD   Chief Complaint  Patient presents with  . New Evaluation    pt had vertigo few months       History of Present Illness: Regina Christensen is a 60 y.o. female who was referred by Dr. Thana AtesMorrisey for evaluation of elevated troponin in the setting of vertigo. She has no previous cardiac history. She has chronic Medical conditions that include hypertension, sleep apnea and obesity. She was hospitalized in April for vertigo and for unclear reasons troponin was checked which was borderline elevated at 0.06. She complains of no chest pain but does have exertional dyspnea with moderate activities. No orthopnea, PND or significant leg edema. She does complain of intermittent dry cough and she thinks it might be due to some of her antihypertensive medications.   Past Medical History  Diagnosis Date  . Hypertension   . Depression   . Obesity   . Sleep apnea   . Arthritis   . Morbid obesity with BMI of 45.0-49.9, adult [facility](HCC)     Past Surgical History  Procedure Laterality Date  . Abdominal hysterectomy    . Endometrial ablation      x2  . Gastric bypass      sleeve     Current Outpatient Prescriptions  Medication Sig Dispense Refill  . ALPRAZolam (XANAX) 0.5 MG tablet Take 1 tablet (0.5 mg total) by mouth every 12 (twelve) hours as needed. 60 tablet 2  . aspirin EC 81 MG tablet Take 81 mg by mouth daily.    . benazepril (LOTENSIN) 20 MG tablet Take 1 tablet (20 mg total) by mouth daily. 30 tablet 2  . fexofenadine (ALLEGRA) 180 MG tablet Take 180 mg by mouth daily.    . furosemide (LASIX) 20 MG tablet Take 1 tablet (20 mg total) by mouth 2 (two) times daily. 60 tablet 2  . glucose blood test strip Use as instructed 100 each 12  . hydrALAZINE (APRESOLINE) 25 MG tablet Take 1 tablet (25 mg total) by mouth 2 (two)  times daily. 60 tablet 2  . meclizine (ANTIVERT) 25 MG tablet Take 1 tablet (25 mg total) by mouth every 8 (eight) hours as needed for dizziness. 30 tablet 1  . meloxicam (MOBIC) 15 MG tablet Take 1 tablet (15 mg total) by mouth daily. 30 tablet 0  . potassium chloride SA (K-DUR,KLOR-CON) 20 MEQ tablet Take 1 tablet (20 mEq total) by mouth daily. With lasix 60 tablet 2  . RESTASIS 0.05 % ophthalmic emulsion INSTILL 1 DROP INTO BOTH EYES TWICE A DAY.  4  . triamcinolone (NASACORT) 55 MCG/ACT AERO nasal inhaler Place 2 sprays into the nose daily.    . Triamcinolone Acetonide (TRIAMCINOLONE 0.1 % CREAM : EUCERIN) CREA Apply 1 application topically 2 (two) times daily. 1 each 5  . venlafaxine XR (EFFEXOR-XR) 150 MG 24 hr capsule TAKE 1 CAPSULE DAILY 30 capsule 5   No current facility-administered medications for this visit.    Allergies:   Review of patient's allergies indicates no known allergies.    Social History:  The patient  reports that she has never smoked. She has never used smokeless tobacco. She reports that she does not drink alcohol or use illicit drugs.   Family History:  The patient's family history includes Cancer in her brother; Heart disease  in her father.    ROS:  Please see the history of present illness.   Otherwise, review of systems are positive for none.   All other systems are reviewed and negative.    PHYSICAL EXAM: VS:  BP 138/78 mmHg  Pulse 86  Ht 5\' 8"  (1.727 m)  Wt 293 lb (132.904 kg)  BMI 44.56 kg/m2  SpO2 97% , BMI Body mass index is 44.56 kg/(m^2). GEN: Well nourished, well developed, in no acute distress HEENT: normal Neck: no JVD, carotid bruits, or masses Cardiac: RRR; no  rubs, or gallops,no edema . There is one out of 6 systolic ejection murmur in the aortic area Respiratory:  clear to auscultation bilaterally, normal work of breathing GI: soft, nontender, nondistended, + BS MS: no deformity or atrophy Skin: warm and dry, no rash Neuro:   Strength and sensation are intact Psych: euthymic mood, full affect   EKG:  EKG is ordered today. The ekg ordered today demonstrates normal sinus rhythm with borderline LVH.   Recent Labs: 12/05/2014: TSH 0.807 08/18/2015: ALT 17 08/19/2015: BUN 10; Creatinine, Ser 0.66; Hemoglobin 12.2; Platelets 235; Potassium 3.3*; Sodium 142    Lipid Panel    Component Value Date/Time   CHOL 151 12/05/2014 1117   TRIG 109 12/05/2014 1117   HDL 74 12/05/2014 1117   CHOLHDL 2.0 12/05/2014 1117   LDLCALC 55 12/05/2014 1117      Wt Readings from Last 3 Encounters:  10/26/15 293 lb (132.904 kg)  08/28/15 287 lb 8 oz (130.409 kg)  08/19/15 281 lb 6.4 oz (127.642 kg)      ASSESSMENT AND PLAN:  1.  Exertional dyspnea: Could be angina equivalent. Recent borderline elevation in troponin in the setting of vertigo of unclear significance and could be due to hypertensive heart disease. I requested a pharmacologic nuclear stress test to ensure no obstructive coronary artery disease. She is not able to exercise on a treadmill due to arthritis. If stress test is negative, I recommend continuing treatment of risk factors.  2. Essential hypertension: Blood pressure is reasonably controlled. If her dry cough worsens, consider switching benazepril to an ARB.    Disposition:   FU with me prn  Signed,  Lorine BearsMuhammad Arida, MD  10/26/2015 2:40 PM    [facility]

## 2015-11-07 ENCOUNTER — Telehealth: Payer: Self-pay | Admitting: Cardiovascular Disease

## 2015-11-07 NOTE — Telephone Encounter (Signed)
Reviewed 2-day lexi myoview instructions w/pt who verbalized understanding and is agreeable w/plan.

## 2015-11-08 ENCOUNTER — Ambulatory Visit
Admission: RE | Admit: 2015-11-08 | Discharge: 2015-11-08 | Disposition: A | Discharge status: Home / Self Care | Payer: BC Managed Care – PPO | Source: Ambulatory Visit | Attending: Cardiovascular Disease | Admitting: Cardiovascular Disease

## 2015-11-08 DIAGNOSIS — R0602 Shortness of breath: Secondary | ICD-10-CM | POA: Diagnosis not present

## 2015-11-09 ENCOUNTER — Ambulatory Visit
Admission: RE | Admit: 2015-11-09 | Discharge: 2015-11-09 | Disposition: A | Discharge status: Home / Self Care | Payer: BC Managed Care – PPO | Source: Ambulatory Visit | Attending: Cardiovascular Disease | Admitting: Cardiovascular Disease

## 2015-11-09 DIAGNOSIS — R0602 Shortness of breath: Secondary | ICD-10-CM | POA: Diagnosis not present

## 2015-11-09 MED ORDER — TECHNETIUM TC 99M TETROFOSMIN IV KIT
30.0000 | PACK | Freq: Once | INTRAVENOUS | Status: AC | PRN
  Administered 2015-11-09: 30.68 via INTRAVENOUS

## 2015-11-15 ENCOUNTER — Telehealth: Payer: Self-pay | Admitting: Cardiovascular Disease

## 2015-11-15 NOTE — Telephone Encounter (Signed)
Pt calling asking about her test she did last week Please call back

## 2015-11-15 NOTE — Telephone Encounter (Signed)
Patient called in wanting her stress test results from last week. Reviewed results in detail with her. She verbalized understanding of our conversation and had no further questions at this time.

## 2015-11-20 LAB — NM MYOCAR MULTI W/SPECT W/WALL MOTION / EF
LV dias vol: 137 mL (ref 46–106)
LV sys vol: 57 mL
Peak HR: 94 {beats}/min
Percent HR: 58 %
Rest HR: 83 {beats}/min
SDS: 7
SRS: 14
SSS: 21
TID: 1.09

## 2015-11-20 LAB — POTASSIUM: Potassium: 3 mmol/L — ABNORMAL LOW (ref 3.5–5.2)

## 2015-11-30 ENCOUNTER — Ambulatory Visit (INDEPENDENT_AMBULATORY_CARE_PROVIDER_SITE_OTHER): Payer: BC Managed Care – PPO | Admitting: Family Medicine

## 2015-11-30 ENCOUNTER — Encounter: Payer: Self-pay | Admitting: Family Medicine

## 2015-11-30 VITALS — BP 130/76 | HR 96 | Temp 98.8°F | Resp 16 | Ht 68.0 in | Wt 288.0 lb

## 2015-11-30 DIAGNOSIS — I10 Essential (primary) hypertension: Secondary | ICD-10-CM | POA: Diagnosis not present

## 2015-11-30 DIAGNOSIS — L83 Acanthosis nigricans: Secondary | ICD-10-CM

## 2015-11-30 DIAGNOSIS — G8929 Other chronic pain: Secondary | ICD-10-CM | POA: Diagnosis not present

## 2015-11-30 DIAGNOSIS — F411 Generalized anxiety disorder: Secondary | ICD-10-CM

## 2015-11-30 DIAGNOSIS — M25561 Pain in right knee: Secondary | ICD-10-CM | POA: Diagnosis not present

## 2015-11-30 DIAGNOSIS — Z114 Encounter for screening for human immunodeficiency virus [HIV]: Secondary | ICD-10-CM

## 2015-11-30 DIAGNOSIS — M170 Bilateral primary osteoarthritis of knee: Secondary | ICD-10-CM | POA: Diagnosis not present

## 2015-11-30 DIAGNOSIS — F330 Major depressive disorder, recurrent, mild: Secondary | ICD-10-CM | POA: Insufficient documentation

## 2015-11-30 DIAGNOSIS — E785 Hyperlipidemia, unspecified: Secondary | ICD-10-CM | POA: Diagnosis not present

## 2015-11-30 DIAGNOSIS — R600 Localized edema: Secondary | ICD-10-CM

## 2015-11-30 MED ORDER — MELOXICAM 7.5 MG PO TABS: 7.5000 mg | ORAL_TABLET | Freq: Every day | ORAL | 2 refills | Status: DC

## 2015-11-30 MED ORDER — ALPRAZOLAM 0.5 MG PO TABS: 0.5000 mg | ORAL_TABLET | Freq: Every day | ORAL | 0 refills | Status: DC | PRN

## 2015-11-30 MED ORDER — VALSARTAN 160 MG PO TABS: 160.0000 mg | ORAL_TABLET | Freq: Every day | ORAL | 2 refills | Status: DC

## 2015-11-30 MED ORDER — FUROSEMIDE 20 MG PO TABS: 20.0000 mg | ORAL_TABLET | Freq: Two times a day (BID) | ORAL | 2 refills | Status: DC

## 2015-11-30 MED ORDER — HYDRALAZINE HCL 25 MG PO TABS: 25.0000 mg | ORAL_TABLET | Freq: Two times a day (BID) | ORAL | 2 refills | Status: DC

## 2015-11-30 MED ORDER — POTASSIUM CHLORIDE CRYS ER 20 MEQ PO TBCR: 20.0000 meq | EXTENDED_RELEASE_TABLET | Freq: Every day | ORAL | 2 refills | Status: DC

## 2015-11-30 MED ORDER — VENLAFAXINE HCL ER 150 MG PO CP24: 150.0000 mg | ORAL_CAPSULE | Freq: Every day | ORAL | 5 refills | Status: DC

## 2015-11-30 MED ORDER — BENAZEPRIL HCL 20 MG PO TABS: 20.0000 mg | ORAL_TABLET | Freq: Every day | ORAL | 2 refills | Status: DC

## 2015-11-30 NOTE — Progress Notes (Signed)
Name: Regina Christensen   MRN: [id number]    DOB: [birth year]   Date:11/30/2015       Progress Note  Subjective  Chief Complaint  Chief Complaint  Patient presents with  . Knee Pain    need paper work filled out    HPI   HTN: she on Lotensin 20 mg and hydralazine, off HCTZ because of hypokalemia, she is tolerating medication well and has a chronic dry cough, we will try changing to an ARB  Elevated Troponin: seen by Dr. Kirke Corin and had normal evaluation  Major Depression with  anxiety: taking Effexor , she has stress with her main job, also works a part time job. Taking Alprazolam prn . No crying spells or mood swings, denies suicidal thoughts. She feels tired, but no anhedonia.   OA: both knees are painful daily, has some popping sensation and occasionally instability, she sees [facility] and had steroid injections in the past, she just finished hyaluronic injections. She has difficulty ambulating because of pain.   Morbid Obesity:  She is trying to be more careful with her diet, she drinks water, avoids sweet beverages. She is not very physically active  Hyperlipidemia: not taking medication for it, we will check labs  Acanthosis nigricans: she denies polyphagia, polyuria or polydipsia.   Patient Active Problem List   Diagnosis Date Noted  . Depression, major, recurrent, mild (HCC) 11/30/2015  . Osteoarthritis of both knees 11/30/2015  . BPPV (benign paroxysmal positional vertigo) 08/28/2015  . Hyperlipidemia 11/10/2014  . Essential hypertension 11/09/2014  . Hyperglycemia 11/09/2014  . GAD (generalized anxiety disorder) 11/09/2014  . Acanthosis nigricans 11/09/2014  . Morbid obesity (HCC) 01/06/2012  . Left breast mass 01/06/2012    Past Surgical History:  Procedure Laterality Date  . ABDOMINAL HYSTERECTOMY    . ENDOMETRIAL ABLATION     x2  . GASTRIC BYPASS     sleeve    Family History  Problem Relation Age of Onset  . Heart disease Father   . Cancer  Brother     Social History   Social History  . Marital status: Divorced    Spouse name: N/A  . Number of children: N/A  . Years of education: N/A   Occupational History  . Not on file.   Social History Main Topics  . Smoking status: Never Smoker  . Smokeless tobacco: Never Used  . Alcohol use No     Comment: rare  . Drug use: No  . Sexual activity: No   Other Topics Concern  . Not on file   Social History Narrative  . No narrative on file     Current Outpatient Prescriptions:  .  ALPRAZolam (XANAX) 0.5 MG tablet, Take 1 tablet (0.5 mg total) by mouth daily as needed., Disp: 30 tablet, Rfl: 0 .  aspirin EC 81 MG tablet, Take 81 mg by mouth daily., Disp: , Rfl:  .  benazepril (LOTENSIN) 20 MG tablet, Take 1 tablet (20 mg total) by mouth daily., Disp: 30 tablet, Rfl: 2 .  fexofenadine (ALLEGRA) 180 MG tablet, Take 180 mg by mouth daily., Disp: , Rfl:  .  furosemide (LASIX) 20 MG tablet, Take 1 tablet (20 mg total) by mouth 2 (two) times daily., Disp: 60 tablet, Rfl: 2 .  glucose blood test strip, Use as instructed, Disp: 100 each, Rfl: 12 .  hydrALAZINE (APRESOLINE) 25 MG tablet, Take 1 tablet (25 mg total) by mouth 2 (two) times daily., Disp: 60 tablet, Rfl: 2 .  meloxicam (MOBIC) 7.5 MG tablet, Take 1 tablet (7.5 mg total) by mouth daily., Disp: 30 tablet, Rfl: 2 .  potassium chloride SA (K-DUR,KLOR-CON) 20 MEQ tablet, Take 1 tablet (20 mEq total) by mouth daily. With lasix, Disp: 60 tablet, Rfl: 2 .  RESTASIS 0.05 % ophthalmic emulsion, INSTILL 1 DROP INTO BOTH EYES TWICE A DAY., Disp: , Rfl: 4 .  triamcinolone (NASACORT) 55 MCG/ACT AERO nasal inhaler, Place 2 sprays into the nose daily., Disp: , Rfl:  .  Triamcinolone Acetonide (TRIAMCINOLONE 0.1 % CREAM : EUCERIN) CREA, Apply 1 application topically 2 (two) times daily., Disp: 1 each, Rfl: 5 .  venlafaxine XR (EFFEXOR-XR) 150 MG 24 hr capsule, Take 1 capsule (150 mg total) by mouth daily., Disp: 30 capsule, Rfl: 5  No  Known Allergies   ROS  Constitutional: Negative for fever, positive for mild  weight change.  Respiratory: positive  for dry cough but no shortness of breath.   Cardiovascular: Negative for chest pain or palpitations.  Gastrointestinal: Negative for abdominal pain, no bowel changes.  Musculoskeletal: Negative for gait problem or joint swelling.  Skin: Negative for rash.  Neurological: Negative for dizziness or headache.  No other specific complaints in a complete review of systems (except as listed in HPI above). She has noticed intermittent pain in ears since vertigo but better  Objective  Vitals:   11/30/15 1438  BP: 130/76  Pulse: 96  Resp: 16  Temp: 98.8 F (37.1 C)  TempSrc: Oral  SpO2: 96%  Weight: 288 lb (130.6 kg)  Height: 5\' 8"  (1.727 m)    Body mass index is 43.79 kg/m.  Physical Exam  Constitutional: Patient appears well-developed and well-nourished. Obese  No distress.  HEENT: head atraumatic, normocephalic, pupils equal and reactive to light,  neck supple, throat within normal limits Cardiovascular: Normal rate, regular rhythm and normal heart sounds.  No murmur heard. trace  BLE edema. Pulmonary/Chest: Effort normal and breath sounds normal. No respiratory distress. Abdominal: Soft.  There is no tenderness. Psychiatric: Patient has a normal mood and affect. behavior is normal. Judgment and thought content normal.  Recent Results (from the past 2160 hour(s))  NM Myocar Multi W/Spect W/Wall Motion / EF     Status: None   Collection Time: 11/09/15  9:28 AM  Result Value Ref Range   Rest HR 83 bpm   Rest BP 164/83 mmHg   Exercise duration (min)  min   Exercise duration (sec)  sec   Estimated workload  METS   Peak HR 94 bpm   Peak BP 170/83 mmHg   MPHR  bpm   Percent HR 58 %   RPE     LV sys vol 57 mL   TID 1.09    LV dias vol 137 46 - 106 mL   LHR     SSS 21    SRS 14    SDS 7   Potassium     Status: Abnormal   Collection Time: 11/19/15  3:41  PM  Result Value Ref Range   Potassium 3.0 (L) 3.5 - 5.2 mmol/L      PHQ2/9: [facility] 08/28/2015 04/05/2015 11/09/2014  Decreased Interest 0 0 1  Down, Depressed, Hopeless 0 0 0  PHQ - 2 Score 0 0 1     Fall Risk: Fall Risk  08/28/2015 04/05/2015 11/09/2014  Falls in the past year? No No Yes  Number falls in past yr: - - 1  Injury with Fall? - -  Yes      Assessment & Plan  1. Essential hypertension  - hydrALAZINE (APRESOLINE) 25 MG tablet; Take 1 tablet (25 mg total) by mouth 2 (two) times daily.  Dispense: 60 tablet; Refill: 2 Stop ACE - COMPLETE METABOLIC PANEL WITH GFR - valsartan (DIOVAN) 160 MG tablet; Take 1 tablet (160 mg total) by mouth daily. In place of ACE because of cough  Dispense: 30 tablet; Refill: 2  2. Depression, major, recurrent, mild (HCC)  - venlafaxine XR (EFFEXOR-XR) 150 MG 24 hr capsule; Take 1 capsule (150 mg total) by mouth daily.  Dispense: 30 capsule; Refill: 5  3. Primary osteoarthritis of both knees  - meloxicam (MOBIC) 7.5 MG tablet; Take 1 tablet (7.5 mg total) by mouth daily.  Dispense: 30 tablet; Refill: 2  4. GAD (generalized anxiety disorder)  - ALPRAZolam (XANAX) 0.5 MG tablet; Take 1 tablet (0.5 mg total) by mouth daily as needed.  Dispense: 30 tablet; Refill: 0  5. Hyperlipidemia  - Lipid panel  6. Morbid obesity, unspecified obesity type [facility])  Discussed with the patient the risk posed by an increased BMI. Discussed importance of portion control, calorie counting and at least 150 minutes of physical activity weekly. Avoid sweet beverages and drink more water. Eat at least 6 servings of fruit and vegetables daily   7. Acanthosis nigricans  - Hemoglobin A1c  8. Edema of both legs  - potassium chloride SA (K-DUR,KLOR-CON) 20 MEQ tablet; Take 1 tablet (20 mEq total) by mouth daily. With lasix  Dispense: 60 tablet; Refill: 2 - furosemide (LASIX) 20 MG tablet; Take 1 tablet (20 mg total) by mouth 2 (two) times  daily.  Dispense: 60 tablet; Refill: 2  9. Knee pain, chronic, right  - meloxicam (MOBIC) 7.5 MG tablet; Take 1 tablet (7.5 mg total) by mouth daily.  Dispense: 30 tablet; Refill: 2  10. Encounter for screening for HIV  - HIV antibody

## 2015-12-15 LAB — COMPLETE METABOLIC PANEL WITH GFR
ALT: 12 U/L (ref 6–29)
AST: 12 U/L (ref 10–35)
Albumin: 3.7 g/dL (ref 3.6–5.1)
Alkaline Phosphatase: 74 U/L (ref 33–130)
BUN: 12 mg/dL (ref 7–25)
CO2: 25 mmol/L (ref 20–31)
Calcium: 9.1 mg/dL (ref 8.6–10.4)
Chloride: 105 mmol/L (ref 98–110)
Creat: 0.69 mg/dL (ref 0.50–1.05)
GFR, Est African American: >89 mL/min (ref >=60)
GFR, Est Non African American: >89 mL/min (ref >=60)
Glucose, Bld: 102 mg/dL — ABNORMAL HIGH (ref 65–99)
Potassium: 3.9 mmol/L (ref 3.5–5.3)
Sodium: 143 mmol/L (ref 135–146)
Total Bilirubin: 0.3 mg/dL (ref 0.2–1.2)
Total Protein: 6.3 g/dL (ref 6.1–8.1)

## 2015-12-15 LAB — LIPID PANEL
Cholesterol: 150 mg/dL (ref 125–200)
HDL: 88 mg/dL (ref >=46)
LDL Cholesterol: 50 mg/dL (ref <130)
Total CHOL/HDL Ratio: 1.7 Ratio (ref <=5.0)
Triglycerides: 58 mg/dL (ref <150)
VLDL: 12 mg/dL (ref <30)

## 2015-12-15 LAB — HEMOGLOBIN A1C
Hgb A1c MFr Bld: 6 % — ABNORMAL HIGH (ref <5.7)
Mean Plasma Glucose: 126 mg/dL

## 2015-12-15 LAB — HIV ANTIBODY (ROUTINE TESTING W REFLEX): HIV 1&2 Ab, 4th Generation: NONREACTIVE

## 2016-01-11 ENCOUNTER — Ambulatory Visit (INDEPENDENT_AMBULATORY_CARE_PROVIDER_SITE_OTHER): Payer: BC Managed Care – PPO

## 2016-01-11 DIAGNOSIS — Z23 Encounter for immunization: Secondary | ICD-10-CM

## 2016-02-04 ENCOUNTER — Other Ambulatory Visit: Payer: Self-pay

## 2016-02-04 DIAGNOSIS — M170 Bilateral primary osteoarthritis of knee: Secondary | ICD-10-CM

## 2016-02-04 DIAGNOSIS — I10 Essential (primary) hypertension: Secondary | ICD-10-CM

## 2016-02-04 MED ORDER — MELOXICAM 7.5 MG PO TABS: 7.5000 mg | ORAL_TABLET | Freq: Every day | ORAL | 0 refills | Status: DC

## 2016-02-04 MED ORDER — VALSARTAN 160 MG PO TABS: 160.0000 mg | ORAL_TABLET | Freq: Every day | ORAL | 0 refills | Status: DC

## 2016-02-04 NOTE — Telephone Encounter (Signed)
Patient requesting refill of Valsartan, Meloxicam, Benazepril to [facility].

## 2016-03-03 ENCOUNTER — Telehealth: Payer: Self-pay | Admitting: Family Medicine

## 2016-03-03 ENCOUNTER — Other Ambulatory Visit: Payer: Self-pay | Admitting: Family Medicine

## 2016-03-03 DIAGNOSIS — I10 Essential (primary) hypertension: Secondary | ICD-10-CM

## 2016-03-03 MED ORDER — VALSARTAN 160 MG PO TABS: 160.0000 mg | ORAL_TABLET | Freq: Every day | ORAL | 0 refills | Status: DC

## 2016-03-03 NOTE — Telephone Encounter (Signed)
Pt informed

## 2016-03-03 NOTE — Telephone Encounter (Signed)
Pt needs refill on [facility]alsartan [facility].

## 2016-03-03 NOTE — Telephone Encounter (Signed)
done

## 2016-03-07 ENCOUNTER — Encounter: Payer: Self-pay | Admitting: Family Medicine

## 2016-03-07 ENCOUNTER — Ambulatory Visit (INDEPENDENT_AMBULATORY_CARE_PROVIDER_SITE_OTHER): Payer: BC Managed Care – PPO | Admitting: Family Medicine

## 2016-03-07 VITALS — BP 158/76 | HR 107 | Temp 98.1°F | Resp 16 | Ht 68.0 in | Wt 292.4 lb

## 2016-03-07 DIAGNOSIS — R7303 Prediabetes: Secondary | ICD-10-CM | POA: Diagnosis not present

## 2016-03-07 DIAGNOSIS — M170 Bilateral primary osteoarthritis of knee: Secondary | ICD-10-CM

## 2016-03-07 DIAGNOSIS — E8881 Metabolic syndrome: Secondary | ICD-10-CM

## 2016-03-07 DIAGNOSIS — F411 Generalized anxiety disorder: Secondary | ICD-10-CM | POA: Diagnosis not present

## 2016-03-07 DIAGNOSIS — G4733 Obstructive sleep apnea (adult) (pediatric): Secondary | ICD-10-CM | POA: Diagnosis not present

## 2016-03-07 DIAGNOSIS — I10 Essential (primary) hypertension: Secondary | ICD-10-CM | POA: Diagnosis not present

## 2016-03-07 DIAGNOSIS — R600 Localized edema: Secondary | ICD-10-CM | POA: Diagnosis not present

## 2016-03-07 DIAGNOSIS — R062 Wheezing: Secondary | ICD-10-CM | POA: Diagnosis not present

## 2016-03-07 DIAGNOSIS — F330 Major depressive disorder, recurrent, mild: Secondary | ICD-10-CM

## 2016-03-07 MED ORDER — MONTELUKAST SODIUM 10 MG PO TABS: 10.0000 mg | ORAL_TABLET | Freq: Every day | ORAL | 3 refills | Status: DC

## 2016-03-07 MED ORDER — VALSARTAN 320 MG PO TABS: 320.0000 mg | ORAL_TABLET | Freq: Every day | ORAL | 2 refills | Status: DC

## 2016-03-07 MED ORDER — ALPRAZOLAM 0.5 MG PO TABS: 0.5000 mg | ORAL_TABLET | Freq: Every day | ORAL | 0 refills | Status: DC | PRN

## 2016-03-07 MED ORDER — LIRAGLUTIDE 18 MG/3ML ~~LOC~~ SOPN: 0.6000 mg | PEN_INJECTOR | Freq: Every day | SUBCUTANEOUS | 2 refills | Status: DC

## 2016-03-07 MED ORDER — FUROSEMIDE 20 MG PO TABS: 20.0000 mg | ORAL_TABLET | Freq: Two times a day (BID) | ORAL | 2 refills | Status: DC

## 2016-03-07 MED ORDER — INSULIN PEN NEEDLE 30G X 8 MM MISC: 1.0000 | 0 refills | Status: DC | PRN

## 2016-03-07 MED ORDER — ALBUTEROL SULFATE HFA 108 (90 BASE) MCG/ACT IN AERS: 2.0000 | INHALATION_SPRAY | Freq: Four times a day (QID) | RESPIRATORY_TRACT | 2 refills | Status: DC | PRN

## 2016-03-07 MED ORDER — ALBUTEROL SULFATE (2.5 MG/3ML) 0.083% IN NEBU
2.5000 mg | INHALATION_SOLUTION | Freq: Once | RESPIRATORY_TRACT | Status: AC
  Administered 2016-03-07: 2.5 mg via RESPIRATORY_TRACT

## 2016-03-07 MED ORDER — HYDRALAZINE HCL 25 MG PO TABS: 25.0000 mg | ORAL_TABLET | Freq: Two times a day (BID) | ORAL | 2 refills | Status: DC

## 2016-03-07 MED ORDER — ACETAMINOPHEN ER 650 MG PO TBCR: 650.0000 mg | EXTENDED_RELEASE_TABLET | Freq: Three times a day (TID) | ORAL | 0 refills | Status: DC | PRN

## 2016-03-07 NOTE — Progress Notes (Signed)
Name: Regina Christensen   MRN: [id number]    DOB: 10/07/1955   Date:03/07/2016       Progress Note  Subjective  Chief Complaint  Chief Complaint  Patient presents with  . Medication Refill    3 month F/U  . Hypertension    Swelling in her feet and ankles  . Osteoarthritis    Right knee is hurting her  . Obesity    Patient gained 4 pounds since she is working a full time and a part time job.   . Hyperlipidemia    HPI  HTN: she on Valsartan 160 mg and hydralazine, off HCTZ because of hypokalemia and it was causing facial irritation,  she is tolerating medication well , cough has decreased since off Lotensin. She is not very compliant with second dose of Hydralazine. She denies chest pain or palpitation.   Elevated Troponin: seen by Dr. Kirke Corin and had normal evaluation  Major Depression with  anxiety: taking Effexor , she has stress with her main job, also works a part time job. Taking Alprazolam prn . No crying spells or mood swings, denies suicidal thoughts. She feels tired, but no anhedonia. She is not in remission but is afraid to change medication  OA: both knees are painful daily, has some popping sensation and occasionally instability, she sees [facility] and had steroid injections in the past, she just finished hyaluronic injections. She has difficulty ambulating because of pain. She is on Meloxicam, but has swelling, we will try switching to Tylenol   Morbid Obesity:  She has gained some weight since last visit, she states she will resume her diet again.   Acanthosis nigricans/Metabolic syndrome: she denies polyphagia, polyuria or polydipsia, last hgbA1C was 6.0, discussed options and we will try Victoza  OSA: never able to tolerate CPAP, she has fatigue, day sleepiness, mental fogginess  Wheezing: she states she has allergies and has noticed some wheezing, it happens this time of the year, cough has improved since she stopped ACE, no orthopnea.    Patient  Active Problem List   Diagnosis Date Noted  . Depression, major, recurrent, mild (HCC) 11/30/2015  . Osteoarthritis of both knees 11/30/2015  . BPPV (benign paroxysmal positional vertigo) 08/28/2015  . Hyperlipidemia 11/10/2014  . Essential hypertension 11/09/2014  . Hyperglycemia 11/09/2014  . GAD (generalized anxiety disorder) 11/09/2014  . Acanthosis nigricans 11/09/2014  . Morbid obesity (HCC) 01/06/2012  . Left breast mass 01/06/2012    Past Surgical History:  Procedure Laterality Date  . ABDOMINAL HYSTERECTOMY    . ENDOMETRIAL ABLATION     x2  . GASTRIC BYPASS     sleeve    Family History  Problem Relation Age of Onset  . Heart disease Father   . Cancer Brother     Social History   Social History  . Marital status: Divorced    Spouse name: N/A  . Number of children: N/A  . Years of education: N/A   Occupational History  . Not on file.   Social History Main Topics  . Smoking status: Never Smoker  . Smokeless tobacco: Never Used  . Alcohol use No     Comment: rare  . Drug use: No  . Sexual activity: No   Other Topics Concern  . Not on file   Social History Narrative  . No narrative on file     Current Outpatient Prescriptions:  .  ALPRAZolam (XANAX) 0.5 MG tablet, Take 1 tablet (0.5 mg total) by mouth  daily as needed., Disp: 30 tablet, Rfl: 0 .  aspirin EC 81 MG tablet, Take 81 mg by mouth daily., Disp: , Rfl:  .  fexofenadine (ALLEGRA) 180 MG tablet, Take 180 mg by mouth daily., Disp: , Rfl:  .  furosemide (LASIX) 20 MG tablet, Take 1 tablet (20 mg total) by mouth 2 (two) times daily., Disp: 60 tablet, Rfl: 2 .  glucose blood test strip, Use as instructed, Disp: 100 each, Rfl: 12 .  hydrALAZINE (APRESOLINE) 25 MG tablet, Take 1 tablet (25 mg total) by mouth 2 (two) times daily., Disp: 60 tablet, Rfl: 2 .  LOTEMAX 0.5 % GEL, , Disp: , Rfl:  .  potassium chloride SA (K-DUR,KLOR-CON) 20 MEQ tablet, Take 1 tablet (20 mEq total) by mouth daily. With  lasix, Disp: 60 tablet, Rfl: 2 .  RESTASIS 0.05 % ophthalmic emulsion, INSTILL 1 DROP INTO BOTH EYES TWICE A DAY., Disp: , Rfl: 4 .  triamcinolone (NASACORT) 55 MCG/ACT AERO nasal inhaler, Place 2 sprays into the nose daily., Disp: , Rfl:  .  Triamcinolone Acetonide (TRIAMCINOLONE 0.1 % CREAM : EUCERIN) CREA, Apply 1 application topically 2 (two) times daily., Disp: 1 each, Rfl: 5 .  valsartan (DIOVAN) 320 MG tablet, Take 1 tablet (320 mg total) by mouth daily. In place of ACE because of cough, Disp: 30 tablet, Rfl: 2 .  venlafaxine XR (EFFEXOR-XR) 150 MG 24 hr capsule, Take 1 capsule (150 mg total) by mouth daily., Disp: 30 capsule, Rfl: 5 .  acetaminophen (TYLENOL 8 HOUR ARTHRITIS PAIN) 650 MG CR tablet, Take 1 tablet (650 mg total) by mouth every 8 (eight) hours as needed for pain., Disp: 90 tablet, Rfl: 0  Allergies  Allergen Reactions  . Ace Inhibitors Cough     ROS   Constitutional: Negative for fever, positive for  weight change.  Respiratory: Negative for cough and shortness of breath.   Cardiovascular: Negative for chest pain or palpitations.  Gastrointestinal: Negative for abdominal pain, no bowel changes.  Musculoskeletal: Negative for gait problem or joint swelling.  Skin: Negative for rash.  Neurological: Negative for dizziness or headache.  No other specific complaints in a complete review of systems (except as listed in HPI above).  Objective  Vitals:   03/07/16 1351  BP: (!) 158/76  Pulse: (!) 107  Resp: 16  Temp: 98.1 F (36.7 C)  TempSrc: Oral  SpO2: 95%  Weight: 292 lb 6.4 oz (132.6 kg)  Height: 5\' 8"  (1.727 m)    Body mass index is 44.46 kg/m.  Physical Exam  Constitutional: Patient appears well-developed and well-nourished. Obese  No distress.  HEENT: head atraumatic, normocephalic, pupils equal and reactive to light,neck supple, throat within normal limits Cardiovascular: Normal rate, regular rhythm and normal heart sounds.  No murmur heard. No BLE  edema. Pulmonary/Chest: Effort normal and breath sounds normal. No respiratory distress. Abdominal: Soft.  There is no tenderness. Psychiatric: Patient has a normal mood and affect. behavior is normal. Judgment and thought content normal.  Recent Results (from the past 2160 hour(s))  Lipid panel     Status: None   Collection Time: 12/14/15  8:22 AM  Result Value Ref Range   Cholesterol 150 125 - 200 mg/dL   Triglycerides 58 <161<150 mg/dL   HDL 88 >=09>=46 mg/dL   Total CHOL/HDL Ratio 1.7 <=5.0 Ratio   VLDL 12 <30 mg/dL   LDL Cholesterol 50 <604<130 mg/dL    Comment:   Total Cholesterol/HDL Ratio:CHD Risk  Coronary Heart Disease Risk Table                                        Men       Women          1/2 Average Risk              3.4        3.3              Average Risk              5.0        4.4           2X Average Risk              9.6        7.1           3X Average Risk             23.4       11.0 Use the calculated Patient Ratio above and the CHD Risk table  to determine the patient's CHD Risk.   COMPLETE METABOLIC PANEL WITH GFR     Status: Abnormal   Collection Time: 12/14/15  8:22 AM  Result Value Ref Range   Sodium 143 135 - 146 mmol/L   Potassium 3.9 3.5 - 5.3 mmol/L   Chloride 105 98 - 110 mmol/L   CO2 25 20 - 31 mmol/L   Glucose, Bld 102 (H) 65 - 99 mg/dL   BUN 12 7 - 25 mg/dL   Creat 1.61 0.96 - 0.45 mg/dL    Comment:   For patients > or = 59 years of age: The upper reference limit for Creatinine is approximately 13% higher for people identified as African-American.      Total Bilirubin 0.3 0.2 - 1.2 mg/dL   Alkaline Phosphatase 74 33 - 130 U/L   AST 12 10 - 35 U/L   ALT 12 6 - 29 U/L   Total Protein 6.3 6.1 - 8.1 g/dL   Albumin 3.7 3.6 - 5.1 g/dL   Calcium 9.1 8.6 - 40.9 mg/dL   GFR, Est African American >89 >=60 mL/min   GFR, Est Non African American >89 >=60 mL/min  Hemoglobin A1c     Status: Abnormal   Collection Time: 12/14/15  8:22  AM  Result Value Ref Range   Hgb A1c MFr Bld 6.0 (H) <5.7 %    Comment:   For someone without known diabetes, a hemoglobin A1c value between 5.7% and 6.4% is consistent with prediabetes and should be confirmed with a follow-up test.   For someone with known diabetes, a value <7% indicates that their diabetes is well controlled. A1c targets should be individualized based on duration of diabetes, age, co-morbid conditions and other considerations.   This assay result is consistent with an increased risk of diabetes.   Currently, no consensus exists regarding use of hemoglobin A1c for diagnosis of diabetes in children.      Mean Plasma Glucose 126 mg/dL  HIV antibody     Status: None   Collection Time: 12/14/15  8:22 AM  Result Value Ref Range   HIV 1&2 Ab, 4th Generation NONREACTIVE NONREACTIVE    Comment:   HIV-1 antigen and HIV-1/HIV-2 antibodies were not detected.  There is no laboratory evidence of HIV infection.   HIV-1/2 Antibody Diff  Not indicated. HIV-1 RNA, Qual TMA          Not indicated.     PLEASE NOTE: This information has been disclosed to you from records whose confidentiality may be protected by state law. If your state requires such protection, then the state law prohibits you from making any further disclosure of the information without the specific written consent of the person to whom it pertains, or as otherwise permitted by law. A general authorization for the release of medical or other information is NOT sufficient for this purpose.   The performance of this assay has not been clinically validated in patients less than 18 years old.   For additional information please refer to http://education.[employer].com/faq/FAQ106.  (This link is being provided for informational/educational purposes only.)         PHQ2/9: Depression screen [facility] 2/9 03/07/2016 08/28/2015 04/05/2015 11/09/2014  Decreased Interest 0 0 0 1  Down, Depressed,  Hopeless 0 0 0 0  PHQ - 2 Score 0 0 0 1     Fall Risk: Fall Risk  03/07/2016 08/28/2015 04/05/2015 11/09/2014  Falls in the past year? Yes No No Yes  Number falls in past yr: 1 - - 1  Injury with Fall? No - - Yes     Functional Status Survey: Is the patient deaf or have difficulty hearing?: No Does the patient have difficulty seeing, even when wearing glasses/contacts?: No Does the patient have difficulty concentrating, remembering, or making decisions?: No Does the patient have difficulty walking or climbing stairs?: No Does the patient have difficulty dressing or bathing?: No Does the patient have difficulty doing errands alone such as visiting a doctor's office or shopping?: No   Assessment & Plan  1. Essential hypertension  bp is not at goal, we will increase dose of Diovan, and make sure to take Hydralazine twice daily  - valsartan (DIOVAN) 320 MG tablet; Take 1 tablet (320 mg total) by mouth daily. In place of ACE because of cough  Dispense: 30 tablet; Refill: 2 - hydrALAZINE (APRESOLINE) 25 MG tablet; Take 1 tablet (25 mg total) by mouth 2 (two) times daily.  Dispense: 60 tablet; Refill: 2  2. Depression, major, recurrent, mild (HCC)  Working two jobs again, getting frustrated but states Effexor helps  3. Primary osteoarthritis of both knees  Advised to stop Meloxicam because of elevated bp and fluid retention, we will try max of 3 g of Tylenol per day - acetaminophen (TYLENOL 8 HOUR ARTHRITIS PAIN) 650 MG CR tablet; Take 1 tablet (650 mg total) by mouth every 8 (eight) hours as needed for pain.  Dispense: 90 tablet; Refill: 0  4. GAD (generalized anxiety disorder)  - ALPRAZolam (XANAX) 0.5 MG tablet; Take 1 tablet (0.5 mg total) by mouth daily as needed.  Dispense: 30 tablet; Refill: 0   5. Morbid obesity, unspecified obesity type [facility])  Discussed with the patient the risk posed by an increased BMI. Discussed importance of portion control, calorie counting and at  least 150 minutes of physical activity weekly. Avoid sweet beverages and drink more water. Eat at least 6 servings of fruit and vegetables daily   6. Edema of both legs  - furosemide (LASIX) 20 MG tablet; Take 1 tablet (20 mg total) by mouth 2 (two) times daily.  Dispense: 60 tablet; Refill: 2   7. OSA (obstructive sleep apnea)   She could no tolerate CPAP, but she will try the oral device from her dentist  8. Wheezing  - Spirometry:  Pre & Post Eval She may have RAD, we will give her siingulair and Ventolin to take prn   9. Prediabetes  - liraglutide (VICTOZA) 18 MG/3ML SOPN; Inject 0.1-0.3 mLs (0.6-1.8 mg total) into the skin daily.  Dispense: 9 mL; Refill: 2 - Insulin Pen Needle (NOVOFINE) 30G X 8 MM MISC; Inject 10 each into the skin as needed.  Dispense: 100 each; Refill: 0  10. Metabolic syndrome  - liraglutide (VICTOZA) 18 MG/3ML SOPN; Inject 0.1-0.3 mLs (0.6-1.8 mg total) into the skin daily.  Dispense: 9 mL; Refill: 2 - Insulin Pen Needle (NOVOFINE) 30G X 8 MM MISC; Inject 10 each into the skin as needed.  Dispense: 100 each; Refill: 0

## 2016-04-01 ENCOUNTER — Ambulatory Visit (INDEPENDENT_AMBULATORY_CARE_PROVIDER_SITE_OTHER): Payer: BC Managed Care – PPO | Admitting: Family Medicine

## 2016-04-01 ENCOUNTER — Encounter: Payer: Self-pay | Admitting: Family Medicine

## 2016-04-01 DIAGNOSIS — J0110 Acute frontal sinusitis, unspecified: Secondary | ICD-10-CM | POA: Diagnosis not present

## 2016-04-01 MED ORDER — BENZONATATE 200 MG PO CAPS: 200.0000 mg | ORAL_CAPSULE | Freq: Three times a day (TID) | ORAL | 0 refills | Status: DC | PRN

## 2016-04-01 MED ORDER — AZITHROMYCIN 250 MG PO TABS: ORAL_TABLET | ORAL | 0 refills | Status: DC

## 2016-04-01 NOTE — Progress Notes (Signed)
Name: Regina Christensen   MRN: [id number]    DOB: 10/16/1955   Date:04/01/2016       Progress Note  Subjective  Chief Complaint  Chief Complaint  Patient presents with  . Sinus Problem    x2 days  . Cough  . Nasal Congestion    Sinus Problem  This is a new problem. The current episode started in the past 7 days (2 days ago). There has been no fever. Associated symptoms include chills, congestion (chest congestion and post nasal drainage), coughing and sinus pressure. Pertinent negatives include no shortness of breath. Past treatments include oral decongestants (Coricidin). The treatment provided moderate relief.     Past Medical History:  Diagnosis Date  . Arthritis   . Depression   . Hypertension   . Morbid obesity with BMI of 45.0-49.9, adult (HCC)   . Obesity   . Sleep apnea     Past Surgical History:  Procedure Laterality Date  . ABDOMINAL HYSTERECTOMY    . ENDOMETRIAL ABLATION     x2  . GASTRIC BYPASS     sleeve    Family History  Problem Relation Age of Onset  . Heart disease Father   . Cancer Brother     Social History   Social History  . Marital status: Divorced    Spouse name: N/A  . Number of children: N/A  . Years of education: N/A   Occupational History  . Not on file.   Social History Main Topics  . Smoking status: Never Smoker  . Smokeless tobacco: Never Used  . Alcohol use No     Comment: rare  . Drug use: No  . Sexual activity: No   Other Topics Concern  . Not on file   Social History Narrative  . No narrative on file     Current Outpatient Prescriptions:  .  acetaminophen (TYLENOL 8 HOUR ARTHRITIS PAIN) 650 MG CR tablet, Take 1 tablet (650 mg total) by mouth every 8 (eight) hours as needed for pain., Disp: 90 tablet, Rfl: 0 .  albuterol (PROVENTIL HFA;VENTOLIN HFA) 108 (90 Base) MCG/ACT inhaler, Inhale 2 puffs into the lungs every 6 (six) hours as needed for wheezing or shortness of breath., Disp: 1 Inhaler, Rfl: 2 .   ALPRAZolam (XANAX) 0.5 MG tablet, Take 1 tablet (0.5 mg total) by mouth daily as needed., Disp: 30 tablet, Rfl: 0 .  aspirin EC 81 MG tablet, Take 81 mg by mouth daily., Disp: , Rfl:  .  fexofenadine (ALLEGRA) 180 MG tablet, Take 180 mg by mouth daily., Disp: , Rfl:  .  furosemide (LASIX) 20 MG tablet, Take 1 tablet (20 mg total) by mouth 2 (two) times daily., Disp: 60 tablet, Rfl: 2 .  glucose blood test strip, Use as instructed, Disp: 100 each, Rfl: 12 .  hydrALAZINE (APRESOLINE) 25 MG tablet, Take 1 tablet (25 mg total) by mouth 2 (two) times daily., Disp: 60 tablet, Rfl: 2 .  Insulin Pen Needle (NOVOFINE) 30G X 8 MM MISC, Inject 10 each into the skin as needed., Disp: 100 each, Rfl: 0 .  liraglutide (VICTOZA) 18 MG/3ML SOPN, Inject 0.1-0.3 mLs (0.6-1.8 mg total) into the skin daily., Disp: 9 mL, Rfl: 2 .  LOTEMAX 0.5 % GEL, , Disp: , Rfl:  .  montelukast (SINGULAIR) 10 MG tablet, Take 1 tablet (10 mg total) by mouth at bedtime., Disp: 30 tablet, Rfl: 3 .  potassium chloride SA (K-DUR,KLOR-CON) 20 MEQ tablet, Take 1 tablet (20 mEq  total) by mouth daily. With lasix, Disp: 60 tablet, Rfl: 2 .  triamcinolone (NASACORT) 55 MCG/ACT AERO nasal inhaler, Place 2 sprays into the nose daily., Disp: , Rfl:  .  Triamcinolone Acetonide (TRIAMCINOLONE 0.1 % CREAM : EUCERIN) CREA, Apply 1 application topically 2 (two) times daily., Disp: 1 each, Rfl: 5 .  valsartan (DIOVAN) 320 MG tablet, Take 1 tablet (320 mg total) by mouth daily. In place of ACE because of cough, Disp: 30 tablet, Rfl: 2 .  venlafaxine XR (EFFEXOR-XR) 150 MG 24 hr capsule, Take 1 capsule (150 mg total) by mouth daily., Disp: 30 capsule, Rfl: 5 .  RESTASIS 0.05 % ophthalmic emulsion, INSTILL 1 DROP INTO BOTH EYES TWICE A DAY., Disp: , Rfl: 4  Allergies  Allergen Reactions  . Ace Inhibitors Cough     Review of Systems  Constitutional: Positive for chills. Negative for fever.  HENT: Positive for congestion (chest congestion and post  nasal drainage), sinus pain and sinus pressure.   Respiratory: Positive for cough and sputum production. Negative for shortness of breath and wheezing.   Cardiovascular: Negative for chest pain.     Objective  Vitals:   04/01/16 1114  BP: (!) 147/80  Pulse: (!) 106  Resp: 18  Temp: 98.1 F (36.7 C)  TempSrc: Oral  SpO2: 96%  Weight: 285 lb 3.2 oz (129.4 kg)  Height: 5\' 8"  (1.727 m)    Physical Exam  Constitutional: She is oriented to person, place, and time and well-developed, well-nourished, and in no distress.  HENT:  Right Ear: Tympanic membrane and ear canal normal.  Left Ear: Tympanic membrane and ear canal normal.  Nose: Right sinus exhibits frontal sinus tenderness. Left sinus exhibits frontal sinus tenderness.  Mouth/Throat: Posterior oropharyngeal erythema present.  Nasal mucosal inflammation, turbinates inflamaed and hypertrophied  Cardiovascular: Normal rate, regular rhythm and normal heart sounds.   No murmur heard. Pulmonary/Chest: Effort normal and breath sounds normal. She has no wheezes.  Neurological: She is alert and oriented to person, place, and time.  Nursing note and vitals reviewed.      Assessment & Plan  1. Acute non-recurrent frontal sinusitis  - azithromycin (ZITHROMAX) 250 MG tablet; 2 tabs po day 1, then 1 tab po q day x 4 days  Dispense: 6 tablet; Refill: 0 - benzonatate (TESSALON) 200 MG capsule; Take 1 capsule (200 mg total) by mouth 3 (three) times daily as needed for cough.  Dispense: 21 capsule; Refill: 0   [name] Asad A. Faylene KurtzShah [facility] 04/01/2016 11:28 AM

## 2016-04-28 ENCOUNTER — Other Ambulatory Visit: Payer: Self-pay | Admitting: Family Medicine

## 2016-04-28 DIAGNOSIS — I10 Essential (primary) hypertension: Secondary | ICD-10-CM

## 2016-04-28 DIAGNOSIS — M170 Bilateral primary osteoarthritis of knee: Secondary | ICD-10-CM

## 2016-04-28 NOTE — Telephone Encounter (Signed)
Patient requesting refill of Meloxicam and Valsartan to [facility].

## 2016-04-29 ENCOUNTER — Other Ambulatory Visit: Payer: Self-pay | Admitting: Family Medicine

## 2016-04-30 ENCOUNTER — Other Ambulatory Visit: Payer: Self-pay | Admitting: Family Medicine

## 2016-04-30 NOTE — Telephone Encounter (Signed)
We stopped Meloxicam during her visit, no inflammation, only pain and she was advised to take Tylenol 500 or 650 mg four times daily for pain

## 2016-04-30 NOTE — Telephone Encounter (Signed)
Spoke with Pharmacist at [facility], they state she does have one more refill on Valsartan. So she is ok on the BP medication for 1 month but the Meloxicam was filled on March 29, 2016 with no refills.

## 2016-06-03 ENCOUNTER — Other Ambulatory Visit: Payer: Self-pay | Admitting: Family Medicine

## 2016-06-03 DIAGNOSIS — I10 Essential (primary) hypertension: Secondary | ICD-10-CM

## 2016-06-04 ENCOUNTER — Other Ambulatory Visit: Payer: Self-pay | Admitting: Family Medicine

## 2016-06-04 DIAGNOSIS — I10 Essential (primary) hypertension: Secondary | ICD-10-CM

## 2016-06-04 NOTE — Telephone Encounter (Signed)
Patient requesting refill of Hydralazine to [facility]. 

## 2016-06-20 ENCOUNTER — Encounter: Payer: Self-pay | Admitting: Family Medicine

## 2016-06-20 ENCOUNTER — Ambulatory Visit (INDEPENDENT_AMBULATORY_CARE_PROVIDER_SITE_OTHER): Payer: BC Managed Care – PPO | Admitting: Family Medicine

## 2016-06-20 VITALS — BP 152/86 | HR 109 | Temp 98.2°F | Resp 16 | Ht 68.0 in | Wt 283.4 lb

## 2016-06-20 DIAGNOSIS — R202 Paresthesia of skin: Secondary | ICD-10-CM | POA: Diagnosis not present

## 2016-06-20 DIAGNOSIS — L308 Other specified dermatitis: Secondary | ICD-10-CM | POA: Diagnosis not present

## 2016-06-20 DIAGNOSIS — I10 Essential (primary) hypertension: Secondary | ICD-10-CM | POA: Diagnosis not present

## 2016-06-20 DIAGNOSIS — F330 Major depressive disorder, recurrent, mild: Secondary | ICD-10-CM | POA: Diagnosis not present

## 2016-06-20 DIAGNOSIS — R0989 Other specified symptoms and signs involving the circulatory and respiratory systems: Secondary | ICD-10-CM

## 2016-06-20 DIAGNOSIS — E8881 Metabolic syndrome: Secondary | ICD-10-CM

## 2016-06-20 DIAGNOSIS — R7303 Prediabetes: Secondary | ICD-10-CM

## 2016-06-20 DIAGNOSIS — G4733 Obstructive sleep apnea (adult) (pediatric): Secondary | ICD-10-CM

## 2016-06-20 DIAGNOSIS — R600 Localized edema: Secondary | ICD-10-CM

## 2016-06-20 DIAGNOSIS — J989 Respiratory disorder, unspecified: Secondary | ICD-10-CM

## 2016-06-20 DIAGNOSIS — F411 Generalized anxiety disorder: Secondary | ICD-10-CM | POA: Diagnosis not present

## 2016-06-20 DIAGNOSIS — M65312 Trigger thumb, left thumb: Secondary | ICD-10-CM

## 2016-06-20 DIAGNOSIS — M65311 Trigger thumb, right thumb: Secondary | ICD-10-CM

## 2016-06-20 LAB — VITAMIN B12: Vitamin B-12: 874 pg/mL (ref 200–1100)

## 2016-06-20 MED ORDER — INSULIN PEN NEEDLE 30G X 8 MM MISC: 1.0000 | 0 refills | Status: DC | PRN

## 2016-06-20 MED ORDER — MONTELUKAST SODIUM 10 MG PO TABS: 10.0000 mg | ORAL_TABLET | Freq: Every day | ORAL | 3 refills | Status: DC

## 2016-06-20 MED ORDER — CLONIDINE HCL 0.1 MG/24HR TD PTWK: 0.1000 mg | MEDICATED_PATCH | TRANSDERMAL | 0 refills | Status: DC

## 2016-06-20 MED ORDER — VALSARTAN-HYDROCHLOROTHIAZIDE 320-25 MG PO TABS: 1.0000 | ORAL_TABLET | Freq: Every day | ORAL | 0 refills | Status: DC

## 2016-06-20 MED ORDER — VENLAFAXINE HCL ER 150 MG PO CP24: 150.0000 mg | ORAL_CAPSULE | Freq: Every day | ORAL | 5 refills | Status: DC

## 2016-06-20 MED ORDER — GLUCOSE BLOOD VI STRP: ORAL_STRIP | 12 refills | Status: DC

## 2016-06-20 MED ORDER — VALSARTAN 320 MG PO TABS: 320.0000 mg | ORAL_TABLET | Freq: Every day | ORAL | 0 refills | Status: DC

## 2016-06-20 MED ORDER — ALPRAZOLAM 0.5 MG PO TABS: 0.5000 mg | ORAL_TABLET | Freq: Every day | ORAL | 0 refills | Status: DC | PRN

## 2016-06-20 MED ORDER — LIRAGLUTIDE 18 MG/3ML ~~LOC~~ SOPN: 1.8000 mg | PEN_INJECTOR | Freq: Every day | SUBCUTANEOUS | 3 refills | Status: DC

## 2016-06-20 MED ORDER — TRIAMCINOLONE 0.1 % CREAM:EUCERIN CREAM 1:1: 1.0000 "application " | TOPICAL_CREAM | Freq: Two times a day (BID) | CUTANEOUS | 0 refills | Status: DC

## 2016-06-20 NOTE — Progress Notes (Signed)
Name: Regina Christensen   MRN: [id number]    DOB: 10/27/1955   Date:06/20/2016       Progress Note  Subjective  Chief Complaint  Chief Complaint  Patient presents with  . Hypertension    3 month follow up  . Obesity  . Depression  . Anxiety    HPI  HTN: she on Valsartan320 mg and hydralazine, off HCTZ because of hypokalemia and it was causing facial irritation,  she is tolerating medication well, not checking bp at home, bp is very high today. She is not very compliant with second dose of Hydralazine. She denies chest pain or palpitation.    Elevated Troponin: seen by Dr. Kirke Corin and had normal evaluation  Major Depression with anxiety: taking Effexor , she has stress with her main job, also works a part time job. Taking Alprazolam prn . No crying spells or mood swings, denies suicidal thoughts. She feels tired, but no anhedonia. She is not in remission but is afraid to change medication  OA:she is now going to [facility] and had hyaluronic acid injections on both knees and is feeling a little better. No effusion at this time, improved since injections a few weeks ago, gait has also improved   Morbid Obesity: She has lost some weight since started on Victoza, discussed life style modification again  Paresthesia: hands, going on for a long time, never had NCS, states sometimes she has shake her hands when driving. She has Ortho and will discuss with them  Trigger Thumb: she has noticed that both thumbs gets stuck and painful. She will discuss with Ortho  Acanthosis nigricans/Metabolic syndrome: she denies polyphagia, polyuria or polydipsia, last hgbA1C was 6.0, she started on Victoza Fall 2017, no side effects, has lost a few pounds.   OSA: never able to tolerate CPAP, she has fatigue, day sleepiness, mental fogginess, she is starting a mouth piece  Wheezing: she has intermittent episodes with allergies  Patient Active Problem List   Diagnosis Date Noted  . OSA  (obstructive sleep apnea) 03/07/2016  . Depression, major, recurrent, mild (HCC) 11/30/2015  . Osteoarthritis of both knees 11/30/2015  . BPPV (benign paroxysmal positional vertigo) 08/28/2015  . Essential hypertension 11/09/2014  . Hyperglycemia 11/09/2014  . GAD (generalized anxiety disorder) 11/09/2014  . Acanthosis nigricans 11/09/2014  . Morbid obesity (HCC) 01/06/2012    Past Surgical History:  Procedure Laterality Date  . ABDOMINAL HYSTERECTOMY    . ENDOMETRIAL ABLATION     x2  . GASTRIC BYPASS     sleeve    Family History  Problem Relation Age of Onset  . Heart disease Father   . Cancer Brother     Social History   Social History  . Marital status: Divorced    Spouse name: N/A  . Number of children: N/A  . Years of education: N/A   Occupational History  . Not on file.   Social History Main Topics  . Smoking status: Never Smoker  . Smokeless tobacco: Never Used  . Alcohol use No     Comment: rare  . Drug use: No  . Sexual activity: No   Other Topics Concern  . Not on file   Social History Narrative  . No narrative on file     Current Outpatient Prescriptions:  .  acetaminophen (TYLENOL 8 HOUR ARTHRITIS PAIN) 650 MG CR tablet, Take 1 tablet (650 mg total) by mouth every 8 (eight) hours as needed for pain., Disp: 90 tablet, Rfl: 0 .  albuterol (PROVENTIL HFA;VENTOLIN HFA) 108 (90 Base) MCG/ACT inhaler, Inhale 2 puffs into the lungs every 6 (six) hours as needed for wheezing or shortness of breath., Disp: 1 Inhaler, Rfl: 2 .  ALPRAZolam (XANAX) 0.5 MG tablet, Take 1 tablet (0.5 mg total) by mouth daily as needed., Disp: 30 tablet, Rfl: 0 .  aspirin EC 81 MG tablet, Take 81 mg by mouth daily., Disp: , Rfl:  .  cloNIDine (CATAPRES - DOSED IN MG/24 HR) 0.1 mg/24hr patch, Place 1 patch (0.1 mg total) onto the skin once a week., Disp: 4 patch, Rfl: 0 .  fexofenadine (ALLEGRA) 180 MG tablet, Take 180 mg by mouth daily., Disp: , Rfl:  .  glucose blood test  strip, Use as instructed, Disp: 100 each, Rfl: 12 .  Insulin Pen Needle (NOVOFINE) 30G X 8 MM MISC, Inject 10 each into the skin as needed., Disp: 100 each, Rfl: 0 .  liraglutide (VICTOZA) 18 MG/3ML SOPN, Inject 0.3 mLs (1.8 mg total) into the skin daily., Disp: 9 mL, Rfl: 3 .  LOTEMAX 0.5 % GEL, , Disp: , Rfl:  .  montelukast (SINGULAIR) 10 MG tablet, Take 1 tablet (10 mg total) by mouth at bedtime., Disp: 30 tablet, Rfl: 3 .  RESTASIS 0.05 % ophthalmic emulsion, INSTILL 1 DROP INTO BOTH EYES TWICE A DAY., Disp: , Rfl: 4 .  triamcinolone (NASACORT) 55 MCG/ACT AERO nasal inhaler, Place 2 sprays into the nose daily., Disp: , Rfl:  .  Triamcinolone Acetonide (TRIAMCINOLONE 0.1 % CREAM : EUCERIN) CREA, Apply 1 application topically 2 (two) times daily., Disp: 1 each, Rfl: 5 .  valsartan-hydrochlorothiazide (DIOVAN HCT) 320-25 MG tablet, Take 1 tablet by mouth daily., Disp: 30 tablet, Rfl: 0 .  venlafaxine XR (EFFEXOR-XR) 150 MG 24 hr capsule, Take 1 capsule (150 mg total) by mouth daily., Disp: 30 capsule, Rfl: 5  Allergies  Allergen Reactions  . Ace Inhibitors Cough     ROS  Constitutional: Negative for fever or significant weight change.  Respiratory: Negative for cough and shortness of breath.   Cardiovascular: Negative for chest pain or palpitations.  Gastrointestinal: Negative for abdominal pain, no bowel changes.  Musculoskeletal: Positive for intermittent gait problem and  joint swelling.  Skin: Negative for rash.  Neurological: Positive for intermittent dizziness ( two episodes in the past 2 weeks - very brief)  or headache.  No other specific complaints in a complete review of systems (except as listed in HPI above).  Objective  Vitals:   06/20/16 1415  BP: (!) 158/84  Pulse: (!) 109  Resp: 16  Temp: 98.2 F (36.8 C)  SpO2: 95%  Weight: 283 lb 6 oz (128.5 kg)  Height: 5\' 8"  (1.727 m)    Body mass index is 43.09 kg/m.  Physical Exam  Constitutional: Patient appears  well-developed and well-nourished. Obese  No distress.  HEENT: head atraumatic, normocephalic, pupils equal and reactive to light,  neck supple, throat within normal limits Cardiovascular: Normal rate, regular rhythm and normal heart sounds.  No murmur heard. positive for  BLE edema. Pulmonary/Chest: Effort normal and breath sounds normal. No respiratory distress. Abdominal: Soft.  There is no tenderness. Psychiatric: Patient has a normal mood and affect. behavior is normal. Judgment and thought content normal. Muscular Skeletal: no tingling with Phalen's or Tinnel's test, crepitus with extension of right knee  PHQ2/9: Depression screen [facility] 2/9 06/20/2016 04/01/2016 03/07/2016 08/28/2015 04/05/2015  Decreased Interest 0 0 0 0 0  Down, Depressed, Hopeless 0 0 0 0  0  PHQ - 2 Score 0 0 0 0 0     Fall Risk: Fall Risk  06/20/2016 04/01/2016 03/07/2016 08/28/2015 04/05/2015  Falls in the past year? No No Yes No No  Number falls in past yr: - - 1 - -  Injury with Fall? - - No - -     Functional Status Survey: Is the patient deaf or have difficulty hearing?: No Does the patient have difficulty seeing, even when wearing glasses/contacts?: No Does the patient have difficulty concentrating, remembering, or making decisions?: No Does the patient have difficulty walking or climbing stairs?: No Does the patient have difficulty dressing or bathing?: No Does the patient have difficulty doing errands alone such as visiting a doctor's office or shopping?: No   Assessment & Plan  1. Essential hypertension  bp is not at goal, she stopped taking Furosemide, we will add clonidine patch, stop Hydralazine since she forgets to take second dose , continue Diovan since she can't take HCTZ because it caused a rash in the past  - cloNIDine (CATAPRES - DOSED IN MG/24 HR) 0.1 mg/24hr patch; Place 1 patch (0.1 mg total) onto the skin once a week.  Dispense: 4 patch; Refill: 0   2. Depression, major, recurrent, mild  (HCC)  - venlafaxine XR (EFFEXOR-XR) 150 MG 24 hr capsule; Take 1 capsule (150 mg total) by mouth daily.  Dispense: 30 capsule; Refill: 5  3. GAD (generalized anxiety disorder)  Taking prn medication - ALPRAZolam (XANAX) 0.5 MG tablet; Take 1 tablet (0.5 mg total) by mouth daily as needed.  Dispense: 30 tablet; Refill: 0  4. Prediabetes  She is happy with medication  - liraglutide (VICTOZA) 18 MG/3ML SOPN; Inject 0.3 mLs (1.8 mg total) into the skin daily.  Dispense: 9 mL; Refill: 3 - Insulin Pen Needle (NOVOFINE) 30G X 8 MM MISC; Inject 10 each into the skin as needed.  Dispense: 100 each; Refill: 0 - glucose blood test strip; Use as instructed  Dispense: 100 each; Refill: 12  5. OSA (obstructive sleep apnea)  She could not tolerate the CPAP machine but just started a mouth piece   6. Edema of both legs  Wearing compressions stockings sometimes, stopped Lasix because it stopped working  7. Morbid obesity, unspecified obesity type [facility](HCC)  Discussed with the patient the risk posed by an increased BMI. Discussed importance of portion control, calorie counting and at least 150 minutes of physical activity weekly. Avoid sweet beverages and drink more water. Eat at least 6 servings of fruit and vegetables daily   8. Metabolic syndrome  - liraglutide (VICTOZA) 18 MG/3ML SOPN; Inject 0.3 mLs (1.8 mg total) into the skin daily.  Dispense: 9 mL; Refill: 3 - Insulin Pen Needle (NOVOFINE) 30G X 8 MM MISC; Inject 10 each into the skin as needed.  Dispense: 100 each; Refill: 0 - glucose blood test strip; Use as instructed  Dispense: 100 each; Refill: 12  9. Reactive airway disease that is not asthma  Normal spirometry in the past, being treated for allergies - montelukast (SINGULAIR) 10 MG tablet; Take 1 tablet (10 mg total) by mouth at bedtime.  Dispense: 30 tablet; Refill: 3  10. Trigger thumb of both thumbs  Go back to Ortho   11. Paresthesia of hand, bilateral  Discussed carpal  tunnel, she is currently going to [facility] and she will discuss it with them

## 2016-06-20 NOTE — Patient Instructions (Signed)
Do not get Diovan / HCTZ

## 2016-07-01 ENCOUNTER — Telehealth: Payer: Self-pay | Admitting: Family Medicine

## 2016-07-01 NOTE — Telephone Encounter (Signed)
Pt wants to know if she needs to come in, in a month due to the cancellation of her BP medication. Pt states she was going to be put on the patch but it had HTZ that is in it. Pt would like to know if she is supposed to start new BP medication. Pt would like a call back so she explain her situation with her medication.

## 2016-07-01 NOTE — Telephone Encounter (Signed)
I don't understand HCTZ is in clonidine.

## 2016-07-02 NOTE — Telephone Encounter (Signed)
Spoke with pharmacist they are on back order with the Clonidine patches and still awaiting for the update on them. They do have these patches in higher doses but not in the 0.1. Informed patient she can push back her appointment until she starts the patches with the Diovan.

## 2016-07-02 NOTE — Telephone Encounter (Signed)
Please post-pone visit until she gets Clonidine 0.1 mg patches

## 2016-07-02 NOTE — Telephone Encounter (Signed)
Please call pt in regards to this issue.

## 2016-07-03 ENCOUNTER — Other Ambulatory Visit: Payer: Self-pay | Admitting: Family Medicine

## 2016-07-03 DIAGNOSIS — F330 Major depressive disorder, recurrent, mild: Secondary | ICD-10-CM

## 2016-07-03 DIAGNOSIS — I10 Essential (primary) hypertension: Secondary | ICD-10-CM

## 2016-07-03 NOTE — Telephone Encounter (Signed)
Patient requesting refill of Hydralazine to [facility]. 

## 2016-07-30 ENCOUNTER — Encounter: Payer: Self-pay | Admitting: Family Medicine

## 2016-07-30 ENCOUNTER — Ambulatory Visit: Payer: BC Managed Care – PPO | Admitting: Family Medicine

## 2016-07-30 DIAGNOSIS — G5603 Carpal tunnel syndrome, bilateral upper limbs: Secondary | ICD-10-CM | POA: Insufficient documentation

## 2016-08-03 ENCOUNTER — Other Ambulatory Visit: Payer: Self-pay | Admitting: Family Medicine

## 2016-08-03 DIAGNOSIS — I10 Essential (primary) hypertension: Secondary | ICD-10-CM

## 2016-08-08 ENCOUNTER — Encounter: Payer: Self-pay | Admitting: Family Medicine

## 2016-08-08 ENCOUNTER — Ambulatory Visit (INDEPENDENT_AMBULATORY_CARE_PROVIDER_SITE_OTHER): Payer: BC Managed Care – PPO | Admitting: Family Medicine

## 2016-08-08 VITALS — BP 154/78 | HR 100 | Temp 98.1°F | Resp 17 | Ht 68.0 in | Wt 285.9 lb

## 2016-08-08 DIAGNOSIS — I10 Essential (primary) hypertension: Secondary | ICD-10-CM

## 2016-08-08 MED ORDER — CLONIDINE HCL 0.1 MG/24HR TD PTWK: 0.1000 mg | MEDICATED_PATCH | TRANSDERMAL | 0 refills | Status: DC

## 2016-08-08 MED ORDER — AMLODIPINE BESYLATE 2.5 MG PO TABS: 2.5000 mg | ORAL_TABLET | Freq: Every day | ORAL | 1 refills | Status: DC

## 2016-08-08 NOTE — Progress Notes (Signed)
Name: Regina Christensen   MRN: [id number]    DOB: 17-Sep-1955   Date:08/08/2016       Progress Note  Subjective  Chief Complaint  Chief Complaint  Patient presents with  . Follow-up    BP    HPI  HTN: Pt presents for one month HTN follow-up. she on Valsartan 320 mg and started using Clonidine 0.1mg  patch/24 hour for four weeks. She is tolerating medication well, not checking bp at home, bp is still not at goal today. Endorses intermittent BLE dependent edema improved with elevation at night. She denies chest pain or palpitation. She has allergies to HCTZ and ACE-I's, has taken Amlodipine in the past and done okay on it.  Patient Active Problem List   Diagnosis Date Noted  . Bilateral carpal tunnel syndrome 07/30/2016  . OSA (obstructive sleep apnea) 03/07/2016  . Depression, major, recurrent, mild (HCC) 11/30/2015  . Osteoarthritis of both knees 11/30/2015  . BPPV (benign paroxysmal positional vertigo) 08/28/2015  . Essential hypertension 11/09/2014  . Hyperglycemia 11/09/2014  . GAD (generalized anxiety disorder) 11/09/2014  . Acanthosis nigricans 11/09/2014  . Morbid obesity (HCC) 01/06/2012    Past Surgical History:  Procedure Laterality Date  . ABDOMINAL HYSTERECTOMY    . ENDOMETRIAL ABLATION     x2  . GASTRIC BYPASS     sleeve    Family History  Problem Relation Age of Onset  . Heart disease Father   . Cancer Brother     Social History   Social History  . Marital status: Divorced    Spouse name: N/A  . Number of children: N/A  . Years of education: N/A   Occupational History  . Not on file.   Social History Main Topics  . Smoking status: Never Smoker  . Smokeless tobacco: Never Used  . Alcohol use No     Comment: rare  . Drug use: No  . Sexual activity: No   Other Topics Concern  . Not on file   Social History Narrative  . No narrative on file     Current Outpatient Prescriptions:  .  acetaminophen (TYLENOL 8 HOUR ARTHRITIS PAIN) 650 MG CR  tablet, Take 1 tablet (650 mg total) by mouth every 8 (eight) hours as needed for pain., Disp: 90 tablet, Rfl: 0 .  albuterol (PROVENTIL HFA;VENTOLIN HFA) 108 (90 Base) MCG/ACT inhaler, Inhale 2 puffs into the lungs every 6 (six) hours as needed for wheezing or shortness of breath., Disp: 1 Inhaler, Rfl: 2 .  ALPRAZolam (XANAX) 0.5 MG tablet, Take 1 tablet (0.5 mg total) by mouth daily as needed., Disp: 30 tablet, Rfl: 0 .  aspirin EC 81 MG tablet, Take 81 mg by mouth daily., Disp: , Rfl:  .  cloNIDine (CATAPRES - DOSED IN MG/24 HR) 0.1 mg/24hr patch, Place 1 patch (0.1 mg total) onto the skin once a week., Disp: 4 patch, Rfl: 0 .  fexofenadine (ALLEGRA) 180 MG tablet, Take 180 mg by mouth daily., Disp: , Rfl:  .  glucose blood test strip, Use as instructed, Disp: 100 each, Rfl: 12 .  Insulin Pen Needle (NOVOFINE) 30G X 8 MM MISC, Inject 10 each into the skin as needed., Disp: 100 each, Rfl: 0 .  liraglutide (VICTOZA) 18 MG/3ML SOPN, Inject 0.3 mLs (1.8 mg total) into the skin daily., Disp: 9 mL, Rfl: 3 .  LOTEMAX 0.5 % GEL, , Disp: , Rfl:  .  montelukast (SINGULAIR) 10 MG tablet, Take 1 tablet (10 mg total) by  mouth at bedtime., Disp: 30 tablet, Rfl: 3 .  RESTASIS 0.05 % ophthalmic emulsion, INSTILL 1 DROP INTO BOTH EYES TWICE A DAY., Disp: , Rfl: 4 .  triamcinolone (NASACORT) 55 MCG/ACT AERO nasal inhaler, Place 2 sprays into the nose daily., Disp: , Rfl:  .  Triamcinolone Acetonide (TRIAMCINOLONE 0.1 % CREAM : EUCERIN) CREA, Apply 1 application topically 2 (two) times daily., Disp: 1 each, Rfl: 0 .  valsartan (DIOVAN) 320 MG tablet, TAKE 1 TABLET (320 MG TOTAL) BY MOUTH DAILY. IN PLACE OF ACE BECAUSE OF COUGH, Disp: 30 tablet, Rfl: 0 .  venlafaxine XR (EFFEXOR-XR) 150 MG 24 hr capsule, Take 1 capsule (150 mg total) by mouth daily., Disp: 30 capsule, Rfl: 5 .  amLODipine (NORVASC) 2.5 MG tablet, Take 1 tablet (2.5 mg total) by mouth daily., Disp: 30 tablet, Rfl: 1  Allergies  Allergen Reactions   . Ace Inhibitors Cough  . Hctz [Hydrochlorothiazide] Rash    face     ROS  Constitutional: Negative for fever or weight change.  Respiratory: Negative for cough and shortness of breath.   Cardiovascular: Negative for chest pain or palpitations.  Gastrointestinal: Negative for abdominal pain, no bowel changes.  Musculoskeletal: Negative for gait problem or joint swelling.  Skin: Negative for rash.  Neurological: Negative for dizziness or headache.  No other specific complaints in a complete review of systems (except as listed in HPI above).  Objective  Vitals:   08/08/16 1435 08/08/16 1518  BP: (!) 150/73 (!) 154/78  Pulse: 100   Resp: 17   Temp: 98.1 F (36.7 C)   TempSrc: Oral   SpO2: 96%   Weight: 285 lb 14.4 oz (129.7 kg)   Height:  (1.727 m)     Body mass index is 43.47 kg/m.  Physical Exam Constitutional: Patient appears well-developed and well-nourished. Obese No distress.  HEENT: head atraumatic, normocephalic, pupils equal and reactive to light Cardiovascular: Normal rate, regular rhythm and normal heart sounds.  No murmur heard. Mild non-pitting BLE edema present. Pulmonary/Chest: Effort normal and breath sounds normal. No respiratory distress. Psychiatric: Patient has a normal mood and affect. behavior is normal. Judgment and thought content normal.  Recent Results (from the past 2160 hour(s))  Vitamin B12     Status: None   Collection Time: 06/20/16  3:03 PM  Result Value Ref Range   Vitamin B-12 874 200 - 1,100 pg/mL   PHQ2/9: Depression screen [facility] 2/9 08/08/2016 06/20/2016 04/01/2016 03/07/2016 08/28/2015  Decreased Interest 0 0 0 0 0  Down, Depressed, Hopeless 0 0 0 0 0  PHQ - 2 Score 0 0 0 0 0    Fall Risk: Fall Risk  08/08/2016 06/20/2016 04/01/2016 03/07/2016 08/28/2015  Falls in the past year? No No No Yes No  Number falls in past yr: - - - 1 -  Injury with Fall? - - - No -    Functional Status Survey: Is the patient deaf or have  difficulty hearing?: No Does the patient have difficulty seeing, even when wearing glasses/contacts?: Yes (glasses) Does the patient have difficulty concentrating, remembering, or making decisions?: No Does the patient have difficulty walking or climbing stairs?: No Does the patient have difficulty dressing or bathing?: No Does the patient have difficulty doing errands alone such as visiting a doctor's office or shopping?: No   Assessment & Plan 1. Essential hypertension - cloNIDine (CATAPRES - DOSED IN MG/24 HR) 0.1 mg/24hr patch; Place 1 patch (0.1 mg total) onto the skin once  a week.  Dispense: 4 patch; Refill: 0 - amLODipine (NORVASC) 2.5 MG tablet; Take 1 tablet (2.5 mg total) by mouth daily.  Dispense: 30 tablet; Refill: 1 -Will start with low dose Amlodipine and titrate slowly. -Continue taking Valsartan  -Dash Diet discussed -Advised patient to monitor BLE edema and RTC if worsening. -Reviewed Health Maintenance: UTD. -Red Flags and when to present for emergency care or RTC including chest pain, shortness of breath, significant edema, lightheadedness/dizziness/near-syncope, and stroke symptoms discussed with patient.  Return in about 4 weeks (around 09/05/2016) for BP f/u.   I have reviewed this encounter including the documentation in this note and/or discussed this patient with the Deboraha Sprang, FNP, NP-C. I am certifying that I agree with the content of this note as supervising physician.  Seen patient with Maurice Small

## 2016-08-08 NOTE — Patient Instructions (Signed)
DASH Eating Plan DASH stands for "Dietary Approaches to Stop Hypertension." The DASH eating plan is a healthy eating plan that has been shown to reduce high blood pressure (hypertension). It may also reduce your risk for type 2 diabetes, heart disease, and stroke. The DASH eating plan may also help with weight loss. What are tips for following this plan? General guidelines  Avoid eating more than 2,300 mg (milligrams) of salt (sodium) a day. If you have hypertension, you may need to reduce your sodium intake to 1,500 mg a day.  Limit alcohol intake to no more than 1 drink a day for nonpregnant women and 2 drinks a day for men. One drink equals 12 oz of beer, 5 oz of wine, or 1 oz of hard liquor.  Work with your health care provider to maintain a healthy body weight or to lose weight. Ask what an ideal weight is for you.  Get at least 30 minutes of exercise that causes your heart to beat faster (aerobic exercise) most days of the week. Activities may include walking, swimming, or biking.  Work with your health care provider or diet and nutrition specialist (dietitian) to adjust your eating plan to your individual calorie needs. Reading food labels  Check food labels for the amount of sodium per serving. Choose foods with less than 5 percent of the Daily Value of sodium. Generally, foods with less than 300 mg of sodium per serving fit into this eating plan.  To find whole grains, look for the word "whole" as the first word in the ingredient list. Shopping  Buy products labeled as "low-sodium" or "no salt added."  Buy fresh foods. Avoid canned foods and premade or frozen meals. Cooking  Avoid adding salt when cooking. Use salt-free seasonings or herbs instead of table salt or sea salt. Check with your health care provider or pharmacist before using salt substitutes.  Do not fry foods. Cook foods using healthy methods such as baking, boiling, grilling, and broiling instead.  Cook with  heart-healthy oils, such as olive, canola, soybean, or sunflower oil. Meal planning   Eat a balanced diet that includes: ? 5 or more servings of fruits and vegetables each day. At each meal, try to fill half of your plate with fruits and vegetables. ? Up to 6-8 servings of whole grains each day. ? Less than 6 oz of lean meat, poultry, or fish each day. A 3-oz serving of meat is about the same size as a deck of cards. One egg equals 1 oz. ? 2 servings of low-fat dairy each day. ? A serving of nuts, seeds, or beans 5 times each week. ? Heart-healthy fats. Healthy fats called Omega-3 fatty acids are found in foods such as flaxseeds and coldwater fish, like sardines, salmon, and mackerel.  Limit how much you eat of the following: ? Canned or prepackaged foods. ? Food that is high in trans fat, such as fried foods. ? Food that is high in saturated fat, such as fatty meat. ? Sweets, desserts, sugary drinks, and other foods with added sugar. ? Full-fat dairy products.  Do not salt foods before eating.  Try to eat at least 2 vegetarian meals each week.  Eat more home-cooked food and less restaurant, buffet, and fast food.  When eating at a restaurant, ask that your food be prepared with less salt or no salt, if possible. What foods are recommended? The items listed may not be a complete list. Talk with your dietitian about what   dietary choices are best for you. Grains Whole-grain or whole-wheat bread. Whole-grain or whole-wheat pasta. Brown rice. Oatmeal. Quinoa. Bulgur. Whole-grain and low-sodium cereals. Pita bread. Low-fat, low-sodium crackers. Whole-wheat flour tortillas. Vegetables Fresh or frozen vegetables (raw, steamed, roasted, or grilled). Low-sodium or reduced-sodium tomato and vegetable juice. Low-sodium or reduced-sodium tomato sauce and tomato paste. Low-sodium or reduced-sodium canned vegetables. Fruits All fresh, dried, or frozen fruit. Canned fruit in natural juice (without  added sugar). Meat and other protein foods Skinless chicken or turkey. Ground chicken or turkey. Pork with fat trimmed off. Fish and seafood. Egg whites. Dried beans, peas, or lentils. Unsalted nuts, nut butters, and seeds. Unsalted canned beans. Lean cuts of beef with fat trimmed off. Low-sodium, lean deli meat. Dairy Low-fat (1%) or fat-free (skim) milk. Fat-free, low-fat, or reduced-fat cheeses. Nonfat, low-sodium ricotta or cottage cheese. Low-fat or nonfat yogurt. Low-fat, low-sodium cheese. Fats and oils Soft margarine without trans fats. Vegetable oil. Low-fat, reduced-fat, or light mayonnaise and salad dressings (reduced-sodium). Canola, safflower, olive, soybean, and sunflower oils. Avocado. Seasoning and other foods Herbs. Spices. Seasoning mixes without salt. Unsalted popcorn and pretzels. Fat-free sweets. What foods are not recommended? The items listed may not be a complete list. Talk with your dietitian about what dietary choices are best for you. Grains Baked goods made with fat, such as croissants, muffins, or some breads. Dry pasta or rice meal packs. Vegetables Creamed or fried vegetables. Vegetables in a cheese sauce. Regular canned vegetables (not low-sodium or reduced-sodium). Regular canned tomato sauce and paste (not low-sodium or reduced-sodium). Regular tomato and vegetable juice (not low-sodium or reduced-sodium). Pickles. Olives. Fruits Canned fruit in a light or heavy syrup. Fried fruit. Fruit in cream or butter sauce. Meat and other protein foods Fatty cuts of meat. Ribs. Fried meat. Bacon. Sausage. Bologna and other processed lunch meats. Salami. Fatback. Hotdogs. Bratwurst. Salted nuts and seeds. Canned beans with added salt. Canned or smoked fish. Whole eggs or egg yolks. Chicken or turkey with skin. Dairy Whole or 2% milk, cream, and half-and-half. Whole or full-fat cream cheese. Whole-fat or sweetened yogurt. Full-fat cheese. Nondairy creamers. Whipped toppings.  Processed cheese and cheese spreads. Fats and oils Butter. Stick margarine. Lard. Shortening. Ghee. Bacon fat. Tropical oils, such as coconut, palm kernel, or palm oil. Seasoning and other foods Salted popcorn and pretzels. Onion salt, garlic salt, seasoned salt, table salt, and sea salt. Worcestershire sauce. Tartar sauce. Barbecue sauce. Teriyaki sauce. Soy sauce, including reduced-sodium. Steak sauce. Canned and packaged gravies. Fish sauce. Oyster sauce. Cocktail sauce. Horseradish that you find on the shelf. Ketchup. Mustard. Meat flavorings and tenderizers. Bouillon cubes. Hot sauce and Tabasco sauce. Premade or packaged marinades. Premade or packaged taco seasonings. Relishes. Regular salad dressings. Where to find more information:  National Heart, Lung, and Blood Institute: www.nhlbi.nih.gov  American Heart Association: www.heart.org Summary  The DASH eating plan is a healthy eating plan that has been shown to reduce high blood pressure (hypertension). It may also reduce your risk for type 2 diabetes, heart disease, and stroke.  With the DASH eating plan, you should limit salt (sodium) intake to 2,300 mg a day. If you have hypertension, you may need to reduce your sodium intake to 1,500 mg a day.  When on the DASH eating plan, aim to eat more fresh fruits and vegetables, whole grains, lean proteins, low-fat dairy, and heart-healthy fats.  Work with your health care provider or diet and nutrition specialist (dietitian) to adjust your eating plan to your individual   calorie needs. This information is not intended to replace advice given to you by your health care provider. Make sure you discuss any questions you have with your health care provider. Document Released: 03/27/2011 Document Revised: 03/31/2016 Document Reviewed: 03/31/2016 Elsevier Interactive Patient Education  2017 Elsevier Inc.  

## 2016-09-05 ENCOUNTER — Encounter: Payer: Self-pay | Admitting: Family Medicine

## 2016-09-05 ENCOUNTER — Ambulatory Visit (INDEPENDENT_AMBULATORY_CARE_PROVIDER_SITE_OTHER): Payer: BC Managed Care – PPO | Admitting: Family Medicine

## 2016-09-05 VITALS — BP 148/88 | HR 104 | Temp 98.0°F | Resp 18 | Ht 68.0 in | Wt 285.9 lb

## 2016-09-05 DIAGNOSIS — I10 Essential (primary) hypertension: Secondary | ICD-10-CM | POA: Diagnosis not present

## 2016-09-05 MED ORDER — CLONIDINE HCL 0.1 MG/24HR TD PTWK: 0.1000 mg | MEDICATED_PATCH | TRANSDERMAL | 0 refills | Status: DC

## 2016-09-05 MED ORDER — AMLODIPINE BESYLATE-VALSARTAN 5-320 MG PO TABS: 1.0000 | ORAL_TABLET | Freq: Every day | ORAL | 0 refills | Status: DC

## 2016-09-05 NOTE — Progress Notes (Signed)
Name: Regina Christensen   MRN: [id number]    DOB: 09/11/1955   Date:09/05/2016       Progress Note  Subjective  Chief Complaint  Chief Complaint  Patient presents with  . Follow-up    4 week F/U  . Hypertension    Has edema in bilateral ankles    HPI  HTN: she came in one month ago to follow up on bp check, since we had to stop HCTZ and add clonidine secondary to rash. BP was still high and Norvasc 2.5 mg was added. She has noticed swelling on both legs, but no chest pain or palpitation. She is frustrated but explained that compression stocking hoses and leg elevation should help with symptoms.   Patient Active Problem List   Diagnosis Date Noted  . Bilateral carpal tunnel syndrome 07/30/2016  . OSA (obstructive sleep apnea) 03/07/2016  . Depression, major, recurrent, mild (HCC) 11/30/2015  . Osteoarthritis of both knees 11/30/2015  . BPPV (benign paroxysmal positional vertigo) 08/28/2015  . Essential hypertension 11/09/2014  . Hyperglycemia 11/09/2014  . GAD (generalized anxiety disorder) 11/09/2014  . Acanthosis nigricans 11/09/2014  . Morbid obesity (HCC) 01/06/2012    Past Surgical History:  Procedure Laterality Date  . ABDOMINAL HYSTERECTOMY    . ENDOMETRIAL ABLATION     x2  . GASTRIC BYPASS     sleeve    Family History  Problem Relation Age of Onset  . Heart disease Father   . Cancer Brother     Social History   Social History  . Marital status: Divorced    Spouse name: N/A  . Number of children: N/A  . Years of education: N/A   Occupational History  . Not on file.   Social History Main Topics  . Smoking status: Never Smoker  . Smokeless tobacco: Never Used  . Alcohol use No     Comment: rare  . Drug use: No  . Sexual activity: No   Other Topics Concern  . Not on file   Social History Narrative  . No narrative on file     Current Outpatient Prescriptions:  .  acetaminophen (TYLENOL 8 HOUR ARTHRITIS PAIN) 650 MG CR tablet, Take 1 tablet  (650 mg total) by mouth every 8 (eight) hours as needed for pain. (Patient taking differently: Take 1,300 mg by mouth every 8 (eight) hours as needed for pain. ), Disp: 90 tablet, Rfl: 0 .  albuterol (PROVENTIL HFA;VENTOLIN HFA) 108 (90 Base) MCG/ACT inhaler, Inhale 2 puffs into the lungs every 6 (six) hours as needed for wheezing or shortness of breath., Disp: 1 Inhaler, Rfl: 2 .  ALPRAZolam (XANAX) 0.5 MG tablet, Take 1 tablet (0.5 mg total) by mouth daily as needed., Disp: 30 tablet, Rfl: 0 .  aspirin EC 81 MG tablet, Take 81 mg by mouth daily., Disp: , Rfl:  .  cloNIDine (CATAPRES - DOSED IN MG/24 HR) 0.1 mg/24hr patch, Place 1 patch (0.1 mg total) onto the skin once a week., Disp: 4 patch, Rfl: 0 .  fexofenadine (ALLEGRA) 180 MG tablet, Take 180 mg by mouth daily., Disp: , Rfl:  .  glucose blood test strip, Use as instructed, Disp: 100 each, Rfl: 12 .  Insulin Pen Needle (NOVOFINE) 30G X 8 MM MISC, Inject 10 each into the skin as needed., Disp: 100 each, Rfl: 0 .  liraglutide (VICTOZA) 18 MG/3ML SOPN, Inject 0.3 mLs (1.8 mg total) into the skin daily., Disp: 9 mL, Rfl: 3 .  LOTEMAX 0.5 %  GEL, , Disp: , Rfl:  .  montelukast (SINGULAIR) 10 MG tablet, Take 1 tablet (10 mg total) by mouth at bedtime., Disp: 30 tablet, Rfl: 3 .  RESTASIS 0.05 % ophthalmic emulsion, INSTILL 1 DROP INTO BOTH EYES TWICE A DAY., Disp: , Rfl: 4 .  Triamcinolone Acetonide (TRIAMCINOLONE 0.1 % CREAM : EUCERIN) CREA, Apply 1 application topically 2 (two) times daily., Disp: 1 each, Rfl: 0 .  venlafaxine XR (EFFEXOR-XR) 150 MG 24 hr capsule, Take 1 capsule (150 mg total) by mouth daily., Disp: 30 capsule, Rfl: 5 .  amLODipine-valsartan (EXFORGE) 5-320 MG tablet, Take 1 tablet by mouth daily., Disp: 30 tablet, Rfl: 0 .  triamcinolone (NASACORT) 55 MCG/ACT AERO nasal inhaler, Place 2 sprays into the nose daily., Disp: , Rfl:   Allergies  Allergen Reactions  . Ace Inhibitors Cough  . Hctz [Hydrochlorothiazide] Rash    face      ROS  Constitutional: Negative for fever or weight change.  Respiratory: Negative for cough and shortness of breath.   Cardiovascular: Negative for chest pain or palpitations.  Gastrointestinal: Negative for abdominal pain, no bowel changes.  Musculoskeletal: Negative for gait problem or joint swelling.  Skin: Negative for rash.  Neurological: Negative for dizziness or headache.  No other specific complaints in a complete review of systems (except as listed in HPI above).  Objective  Vitals:   09/05/16 1508  BP: (!) 148/88  Pulse: (!) 104  Resp: 18  Temp: 98 F (36.7 C)  TempSrc: Oral  SpO2: 97%  Weight: 285 lb 14.4 oz (129.7 kg)  Height: 5\' 8"  (1.727 m)    Body mass index is 43.47 kg/m.  Physical Exam  Constitutional: Patient appears well-developed and well-nourished. Obese  No distress.  HEENT: head atraumatic, normocephalic, pupils equal and reactive to light,  neck supple, throat within normal limits Cardiovascular: Normal rate, regular rhythm and normal heart sounds.  No murmur heard. Trace  BLE edema. Pulmonary/Chest: Effort normal and breath sounds normal. No respiratory distress. Abdominal: Soft.  There is no tenderness. Psychiatric: Patient has a normal mood and affect. behavior is normal. Judgment and thought content normal.  Recent Results (from the past 2160 hour(s))  Vitamin B12     Status: None   Collection Time: 06/20/16  3:03 PM  Result Value Ref Range   Vitamin B-12 874 200 - 1,100 pg/mL     PHQ2/9: Depression screen [facility] 2/9 08/08/2016 06/20/2016 04/01/2016 03/07/2016 08/28/2015  Decreased Interest 0 0 0 0 0  Down, Depressed, Hopeless 0 0 0 0 0  PHQ - 2 Score 0 0 0 0 0     Fall Risk: Fall Risk  08/08/2016 06/20/2016 04/01/2016 03/07/2016 08/28/2015  Falls in the past year? No No No Yes No  Number falls in past yr: - - - 1 -  Injury with Fall? - - - No -      Assessment & Plan  1. Essential hypertension  Discussed that it will increase  the edema of lower legs,  - amLODipine-valsartan (EXFORGE) 5-320 MG tablet; Take 1 tablet by mouth daily.  Dispense: 30 tablet; Refill: 0

## 2016-09-30 ENCOUNTER — Other Ambulatory Visit: Payer: Self-pay | Admitting: Family Medicine

## 2016-09-30 DIAGNOSIS — I10 Essential (primary) hypertension: Secondary | ICD-10-CM

## 2016-09-30 NOTE — Telephone Encounter (Signed)
Pt has appointment in July, but states that she will be out of the clonidine patch and the amlodipine-valsartan prescriptions. Please send another refill to [facility] [address]

## 2016-10-01 MED ORDER — AMLODIPINE BESYLATE-VALSARTAN 5-320 MG PO TABS: 1.0000 | ORAL_TABLET | Freq: Every day | ORAL | 0 refills | Status: DC

## 2016-10-01 MED ORDER — CLONIDINE HCL 0.1 MG/24HR TD PTWK: 0.1000 mg | MEDICATED_PATCH | TRANSDERMAL | 0 refills | Status: DC

## 2016-10-05 ENCOUNTER — Other Ambulatory Visit: Payer: Self-pay | Admitting: Family Medicine

## 2016-10-05 DIAGNOSIS — I10 Essential (primary) hypertension: Secondary | ICD-10-CM

## 2016-10-06 ENCOUNTER — Other Ambulatory Visit: Payer: Self-pay | Admitting: Family Medicine

## 2016-10-06 DIAGNOSIS — I10 Essential (primary) hypertension: Secondary | ICD-10-CM

## 2016-10-06 NOTE — Telephone Encounter (Signed)
Patient requesting refill of Exforge to [facility].

## 2016-10-06 NOTE — Telephone Encounter (Signed)
Patient requesting refill of Clonidine to [facility].

## 2016-10-07 ENCOUNTER — Other Ambulatory Visit: Payer: Self-pay | Admitting: Family Medicine

## 2016-10-07 DIAGNOSIS — I10 Essential (primary) hypertension: Secondary | ICD-10-CM

## 2016-10-07 NOTE — Telephone Encounter (Signed)
Patient requesting refill of Clonidine to [facility]. 

## 2016-10-16 ENCOUNTER — Other Ambulatory Visit: Payer: Self-pay | Admitting: Family Medicine

## 2016-10-16 DIAGNOSIS — Z1231 Encounter for screening mammogram for malignant neoplasm of breast: Secondary | ICD-10-CM

## 2016-10-20 ENCOUNTER — Ambulatory Visit: Payer: BC Managed Care – PPO | Admitting: Family Medicine

## 2016-10-23 ENCOUNTER — Encounter: Payer: Self-pay | Admitting: Family Medicine

## 2016-10-23 ENCOUNTER — Ambulatory Visit (INDEPENDENT_AMBULATORY_CARE_PROVIDER_SITE_OTHER): Payer: BC Managed Care – PPO | Admitting: Family Medicine

## 2016-10-23 VITALS — BP 128/76 | HR 96 | Temp 98.4°F | Resp 16 | Ht 68.0 in | Wt 283.7 lb

## 2016-10-23 DIAGNOSIS — N3001 Acute cystitis with hematuria: Secondary | ICD-10-CM | POA: Diagnosis not present

## 2016-10-23 LAB — POCT URINALYSIS DIPSTICK
Bilirubin, UA: NEGATIVE
Glucose, UA: NEGATIVE
Ketones, UA: NEGATIVE
Nitrite, UA: NEGATIVE
Protein, UA: 30
Spec Grav, UA: 1.01 (ref 1.010–1.025)
Urobilinogen, UA: 1 E.U./dL
pH, UA: 7.5 (ref 5.0–8.0)

## 2016-10-23 MED ORDER — NITROFURANTOIN MACROCRYSTAL 100 MG PO CAPS: 100.0000 mg | ORAL_CAPSULE | Freq: Two times a day (BID) | ORAL | 0 refills | Status: AC

## 2016-10-23 NOTE — Progress Notes (Addendum)
Name: Regina Christensen   MRN: [id number]    DOB: [birth year]   Date:10/23/2016       Progress Note  Subjective  Chief Complaint  Chief Complaint  Patient presents with  . Urinary Tract Infection    lbp, burning, pressure for 3 days    HPI  Pt presents with 3 day history of low back pain radiating into right groin, mild dysuria, and mild nausea, moderate fatigue. Has history of UTI in the past, no history of nephrolithiasis. No vomiting, diarrhea, constipation, or abdominal pain, no gross hematuria.   Patient Active Problem List   Diagnosis Date Noted  . Bilateral carpal tunnel syndrome 07/30/2016  . OSA (obstructive sleep apnea) 03/07/2016  . Depression, major, recurrent, mild (HCC) 11/30/2015  . Osteoarthritis of both knees 11/30/2015  . BPPV (benign paroxysmal positional vertigo) 08/28/2015  . Essential hypertension 11/09/2014  . Hyperglycemia 11/09/2014  . GAD (generalized anxiety disorder) 11/09/2014  . Acanthosis nigricans 11/09/2014  . Morbid obesity (HCC) 01/06/2012    Social History  Substance Use Topics  . Smoking status: Never Smoker  . Smokeless tobacco: Never Used  . Alcohol use No     Comment: rare     Current Outpatient Prescriptions:  .  acetaminophen (TYLENOL 8 HOUR ARTHRITIS PAIN) 650 MG CR tablet, Take 1 tablet (650 mg total) by mouth every 8 (eight) hours as needed for pain. (Patient taking differently: Take 1,300 mg by mouth every 8 (eight) hours as needed for pain. ), Disp: 90 tablet, Rfl: 0 .  albuterol (PROVENTIL HFA;VENTOLIN HFA) 108 (90 Base) MCG/ACT inhaler, Inhale 2 puffs into the lungs every 6 (six) hours as needed for wheezing or shortness of breath., Disp: 1 Inhaler, Rfl: 2 .  ALPRAZolam (XANAX) 0.5 MG tablet, Take 1 tablet (0.5 mg total) by mouth daily as needed., Disp: 30 tablet, Rfl: 0 .  amLODipine-valsartan (EXFORGE) 5-320 MG tablet, TAKE 1 TABLET BY MOUTH EVERY DAY, Disp: 30 tablet, Rfl: 0 .  aspirin EC 81 MG tablet, Take 81 mg by mouth  daily., Disp: , Rfl:  .  cloNIDine (CATAPRES - DOSED IN MG/24 HR) 0.1 mg/24hr patch, PLACE 1 PATCH (0.1 MG TOTAL) ONTO THE SKIN ONCE A WEEK., Disp: 4 patch, Rfl: 0 .  fexofenadine (ALLEGRA) 180 MG tablet, Take 180 mg by mouth daily., Disp: , Rfl:  .  glucose blood test strip, Use as instructed, Disp: 100 each, Rfl: 12 .  Insulin Pen Needle (NOVOFINE) 30G X 8 MM MISC, Inject 10 each into the skin as needed., Disp: 100 each, Rfl: 0 .  liraglutide (VICTOZA) 18 MG/3ML SOPN, Inject 0.3 mLs (1.8 mg total) into the skin daily., Disp: 9 mL, Rfl: 3 .  LOTEMAX 0.5 % GEL, , Disp: , Rfl:  .  montelukast (SINGULAIR) 10 MG tablet, Take 1 tablet (10 mg total) by mouth at bedtime., Disp: 30 tablet, Rfl: 3 .  RESTASIS 0.05 % ophthalmic emulsion, INSTILL 1 DROP INTO BOTH EYES TWICE A DAY., Disp: , Rfl: 4 .  triamcinolone (NASACORT) 55 MCG/ACT AERO nasal inhaler, Place 2 sprays into the nose daily., Disp: , Rfl:  .  Triamcinolone Acetonide (TRIAMCINOLONE 0.1 % CREAM : EUCERIN) CREA, Apply 1 application topically 2 (two) times daily., Disp: 1 each, Rfl: 0 .  venlafaxine XR (EFFEXOR-XR) 150 MG 24 hr capsule, Take 1 capsule (150 mg total) by mouth daily., Disp: 30 capsule, Rfl: 5  Allergies  Allergen Reactions  . Ace Inhibitors Cough  . Hctz [Hydrochlorothiazide] Rash  face    ROS Constitutional: Negative for fever or weight change.  Respiratory: Negative for cough and shortness of breath.   Cardiovascular: Negative for chest pain or palpitations.  Gastrointestinal: Negative for abdominal pain, no bowel changes.  GU: See HPI Musculoskeletal: Negative for gait problem or joint swelling.  Skin: Negative for rash.  Neurological: Negative for dizziness or headache.  No other specific complaints in a complete review of systems (except as listed in HPI above).  Objective  Vitals:   10/23/16 0831  BP: 128/76  Pulse: 96  Resp: 16  Temp: 98.4 F (36.9 C)  TempSrc: Oral  SpO2: 96%  Weight: 283 lb 11.2 oz  (128.7 kg)  Height: 5\' 8"  (1.727 m)    Body mass index is 43.14 kg/m.  Nursing Note and Vital Signs reviewed.  Physical Exam  Constitutional: Patient appears well-developed and well-nourished. Obese No distress.  HEENT: head atraumatic, normocephalic Cardiovascular: Normal rate, regular rhythm, S1/S2 present.  No murmur or rub heard. No BLE edema. Pulmonary/Chest: Effort normal and breath sounds clear. No respiratory distress or retractions. Abdominal: Soft and non-tender, bowel sounds present x4 quadrants.  No CVA Tenderness GU: Deferred. Psychiatric: Patient has a normal mood and affect. behavior is normal. Judgment and thought content normal.  Recent Results (from the past 2160 hour(s))  POCT urinalysis dipstick     Status: Abnormal   Collection Time: 10/23/16  8:35 AM  Result Value Ref Range   Color, UA yellow    Clarity, UA clear    Glucose, UA negative    Bilirubin, UA negative    Ketones, UA negative    Spec Grav, UA 1.010 1.010 - 1.025   Blood, UA large    pH, UA 7.5 5.0 - 8.0   Protein, UA 30    Urobilinogen, UA 1.0 0.2 or 1.0 E.U./dL   Nitrite, UA negative    Leukocytes, UA Moderate (2+) (A) Negative     Assessment & Plan  1. Acute cystitis with hematuria - POCT urinalysis dipstick - nitrofurantoin (MACRODANTIN) 100 MG capsule; Take 1 capsule (100 mg total) by mouth 2 (two) times daily.  Dispense: 10 capsule; Refill: 0 - Urine Culture - Pt will return 7 days after she completes her antibiotic to re-check urine Dip to assess for resolution of hematuria. -Red flags and when to present for emergency care or RTC including fever >101.90F, chest pain, shortness of breath, new/worsening/un-resolving symptoms, frank blood in urine, severe back pain or dysuria, vomiting, severe nausea reviewed with patient at time of visit. Follow up and care instructions discussed and provided in AVS.  I have reviewed this encounter including the documentation in this note and/or  discussed this patient with the Deboraha Sprangprovider,Aloise Copus, FNP, NP-C. I am certifying that I agree with the content of this note as supervising physician.  Alba CoryKrichna Sowles, MD [facility] 10/27/2016, 11:05 AM

## 2016-10-23 NOTE — Patient Instructions (Addendum)
-   Please eat probiotic yogurt or take a probiotic that you can buy at the drug store.  - Please come back 7 days after you finish your antbiotic for a nurse visit to re-check your urine.

## 2016-10-24 LAB — URINE CULTURE

## 2016-10-25 ENCOUNTER — Other Ambulatory Visit: Payer: Self-pay | Admitting: Family Medicine

## 2016-10-25 NOTE — Progress Notes (Signed)
Negative urine culture. Please remind patient to come back 7 days after finishing her antibiotic so that we can re-check urine. If there is still blood present, we will send to urology. Thanks!

## 2016-10-31 ENCOUNTER — Ambulatory Visit (INDEPENDENT_AMBULATORY_CARE_PROVIDER_SITE_OTHER): Payer: BC Managed Care – PPO

## 2016-10-31 ENCOUNTER — Encounter: Payer: Self-pay | Admitting: Podiatry

## 2016-10-31 ENCOUNTER — Ambulatory Visit (INDEPENDENT_AMBULATORY_CARE_PROVIDER_SITE_OTHER): Payer: BC Managed Care – PPO | Admitting: Podiatry

## 2016-10-31 ENCOUNTER — Ambulatory Visit
Admission: RE | Admit: 2016-10-31 | Discharge: 2016-10-31 | Disposition: A | Discharge status: Home / Self Care | Payer: BC Managed Care – PPO | Source: Ambulatory Visit | Attending: Family Medicine | Admitting: Family Medicine

## 2016-10-31 DIAGNOSIS — M79674 Pain in right toe(s): Secondary | ICD-10-CM

## 2016-10-31 DIAGNOSIS — R609 Edema, unspecified: Secondary | ICD-10-CM

## 2016-10-31 DIAGNOSIS — M779 Enthesopathy, unspecified: Secondary | ICD-10-CM

## 2016-10-31 DIAGNOSIS — Z1231 Encounter for screening mammogram for malignant neoplasm of breast: Secondary | ICD-10-CM

## 2016-10-31 MED ORDER — TRIAMCINOLONE ACETONIDE 10 MG/ML IJ SUSP
10.0000 mg | Freq: Once | INTRAMUSCULAR | Status: AC
  Administered 2016-10-31: 10 mg

## 2016-10-31 NOTE — Progress Notes (Signed)
Subjective:    Patient ID: Regina Christensen, female   DOB: 61 y.o.   MRN: [id number]   HPI patient presents stating that she developed pain on the top of her right foot and occasional pain around her left big toe. States that she has had chronic swelling in the forefoot bilateral and does have excessive obesity which is complicating factor    Review of Systems  All other systems reviewed and are negative.       Objective:  Physical Exam  Cardiovascular: Intact distal pulses.   Musculoskeletal: Normal range of motion.  Neurological: She is alert.  Skin: Skin is warm.  Nursing note and vitals reviewed.  neurovascular status intact muscle strength adequate range of motion was within normal limits with patient found to have inflammation pain around the dorsum of the feet right over left with mild inflammation first interspace left foot. 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 Discussed if symptoms were to progress will eventually need CT scans and evaluation  X-rays indicate there is some spurring and there is some lateral displacement of the second metatarsal lesser metatarsals of the cuneiform base with indications of arthritis

## 2016-10-31 NOTE — Progress Notes (Signed)
   Subjective:    Patient ID: Regina Christensen, female    DOB: 07/31/1955, 61 y.o.   MRN: [id number]  HPI Chief Complaint  Patient presents with  . Foot Pain    B/L top foot pain, swollen for about 1 week      Review of Systems  Musculoskeletal: Positive for gait problem.       Objective:   Physical Exam        Assessment & Plan:

## 2016-11-07 ENCOUNTER — Ambulatory Visit (INDEPENDENT_AMBULATORY_CARE_PROVIDER_SITE_OTHER): Payer: BC Managed Care – PPO | Admitting: Family Medicine

## 2016-11-07 ENCOUNTER — Encounter: Payer: Self-pay | Admitting: Family Medicine

## 2016-11-07 VITALS — BP 132/72 | HR 107 | Temp 98.0°F | Resp 16 | Ht 68.0 in | Wt 286.5 lb

## 2016-11-07 DIAGNOSIS — G4733 Obstructive sleep apnea (adult) (pediatric): Secondary | ICD-10-CM

## 2016-11-07 DIAGNOSIS — F330 Major depressive disorder, recurrent, mild: Secondary | ICD-10-CM | POA: Diagnosis not present

## 2016-11-07 DIAGNOSIS — M170 Bilateral primary osteoarthritis of knee: Secondary | ICD-10-CM | POA: Diagnosis not present

## 2016-11-07 DIAGNOSIS — F411 Generalized anxiety disorder: Secondary | ICD-10-CM

## 2016-11-07 DIAGNOSIS — I10 Essential (primary) hypertension: Secondary | ICD-10-CM

## 2016-11-07 DIAGNOSIS — E8881 Metabolic syndrome: Secondary | ICD-10-CM

## 2016-11-07 DIAGNOSIS — R7303 Prediabetes: Secondary | ICD-10-CM | POA: Diagnosis not present

## 2016-11-07 MED ORDER — ALPRAZOLAM 0.5 MG PO TABS: 0.5000 mg | ORAL_TABLET | Freq: Every day | ORAL | 0 refills | Status: DC | PRN

## 2016-11-07 MED ORDER — VENLAFAXINE HCL ER 150 MG PO CP24: 150.0000 mg | ORAL_CAPSULE | Freq: Every day | ORAL | 1 refills | Status: DC

## 2016-11-07 MED ORDER — LIRAGLUTIDE 18 MG/3ML ~~LOC~~ SOPN: 1.8000 mg | PEN_INJECTOR | Freq: Every day | SUBCUTANEOUS | 3 refills | Status: DC

## 2016-11-07 MED ORDER — CLONIDINE HCL 0.1 MG/24HR TD PTWK: 0.1000 mg | MEDICATED_PATCH | TRANSDERMAL | 1 refills | Status: DC

## 2016-11-07 MED ORDER — MELOXICAM 15 MG PO TABS: 15.0000 mg | ORAL_TABLET | Freq: Every day | ORAL | 0 refills | Status: DC

## 2016-11-07 MED ORDER — AMLODIPINE-OLMESARTAN 5-40 MG PO TABS: 1.0000 | ORAL_TABLET | Freq: Every day | ORAL | 1 refills | Status: DC

## 2016-11-07 NOTE — Progress Notes (Signed)
Name: Regina Christensen   MRN: [id number]    DOB: [birth year]   Date:11/07/2016       Progress Note  Subjective  Chief Complaint  Chief Complaint  Patient presents with  . Medication Refill    3 month F/U  . Hypertension    Still having edema in bilateral feet and ankles  . Depression    Work Stress  . Allergic Rhinitis     Sneezing    HPI  HTN: she was  Valsartan320 mg and hydralazine, off HCTZ because of hypokalemia and it was causing facial irritation. For the past few months she has been on Clonidine and Exforge, and bp is at goal, however valsartan has been recalled and we will change it to Azor today, advised to monitor bp at home, she denies chest pain or palpitation  Elevated Troponin: seen by Dr. Kirke Corin and had normal evaluation  Major Depression with anxiety: taking Effexor , she has stress with her main job, also works a part time job. Taking Alprazolam prn . No crying spells or mood swings, denies suicidal thoughts. She feels tired, but no anhedonia. She is not in remission but is afraid to change medication  OA:she is now going to [facility] and had hyaluronic acid injections on both knees and is feeling a little better. No effusion at this time, improved since injections a few weeks ago, gait has also improved. She had pain on right foot and had another steroid injection recently. Explained that losing weight will help with symptoms.   Morbid Obesity: weight has been stable, she is on Victoza for pre-diabetes.   Paresthesia: hands, going on for a long time, never had NCS, states sometimes she has shake her hands when driving. She has been wearing a brace and symptoms has resolved.   Acanthosis nigricans/Metabolic syndrome: she denies polyphagia, polyuria or polydipsia, last hgbA1C was 6.0, she started on Victoza Fall 2017, no side effects, she is due for repeat labs.   OSA: never able to tolerate CPAP, she has fatigue, day sleepiness, mental fogginess, she is  starting a mouth piece  Wheezing: she has intermittent episodes with allergies  Patient Active Problem List   Diagnosis Date Noted  . Bilateral carpal tunnel syndrome 07/30/2016  . OSA (obstructive sleep apnea) 03/07/2016  . Depression, major, recurrent, mild (HCC) 11/30/2015  . Osteoarthritis of both knees 11/30/2015  . BPPV (benign paroxysmal positional vertigo) 08/28/2015  . Essential hypertension 11/09/2014  . Hyperglycemia 11/09/2014  . GAD (generalized anxiety disorder) 11/09/2014  . Acanthosis nigricans 11/09/2014  . Morbid obesity (HCC) 01/06/2012    Past Surgical History:  Procedure Laterality Date  . ABDOMINAL HYSTERECTOMY    . BREAST EXCISIONAL BIOPSY Left   . ENDOMETRIAL ABLATION     x2  . GASTRIC BYPASS     sleeve    Family History  Problem Relation Age of Onset  . Heart disease Father   . Cancer Brother     Social History   Social History  . Marital status: Divorced    Spouse name: N/A  . Number of children: N/A  . Years of education: N/A   Occupational History  . Not on file.   Social History Main Topics  . Smoking status: Never Smoker  . Smokeless tobacco: Never Used  . Alcohol use No     Comment: rare  . Drug use: No  . Sexual activity: No   Other Topics Concern  . Not on file   Social History  Narrative  . No narrative on file     Current Outpatient Prescriptions:  .  acetaminophen (TYLENOL 8 HOUR ARTHRITIS PAIN) 650 MG CR tablet, Take 1 tablet (650 mg total) by mouth every 8 (eight) hours as needed for pain. (Patient taking differently: Take 1,300 mg by mouth every 8 (eight) hours as needed for pain. ), Disp: 90 tablet, Rfl: 0 .  albuterol (PROVENTIL HFA;VENTOLIN HFA) 108 (90 Base) MCG/ACT inhaler, Inhale 2 puffs into the lungs every 6 (six) hours as needed for wheezing or shortness of breath., Disp: 1 Inhaler, Rfl: 2 .  ALPRAZolam (XANAX) 0.5 MG tablet, Take 1 tablet (0.5 mg total) by mouth daily as needed., Disp: 30 tablet, Rfl:  0 .  aspirin EC 81 MG tablet, Take 81 mg by mouth daily., Disp: , Rfl:  .  cloNIDine (CATAPRES - DOSED IN MG/24 HR) 0.1 mg/24hr patch, PLACE 1 PATCH (0.1 MG TOTAL) ONTO THE SKIN ONCE A WEEK., Disp: 4 patch, Rfl: 0 .  fexofenadine (ALLEGRA) 180 MG tablet, Take 180 mg by mouth daily., Disp: , Rfl:  .  glucose blood test strip, Use as instructed, Disp: 100 each, Rfl: 12 .  Insulin Pen Needle (NOVOFINE) 30G X 8 MM MISC, Inject 10 each into the skin as needed., Disp: 100 each, Rfl: 0 .  liraglutide (VICTOZA) 18 MG/3ML SOPN, Inject 0.3 mLs (1.8 mg total) into the skin daily., Disp: 9 mL, Rfl: 3 .  LOTEMAX 0.5 % GEL, , Disp: , Rfl:  .  montelukast (SINGULAIR) 10 MG tablet, Take 1 tablet (10 mg total) by mouth at bedtime., Disp: 30 tablet, Rfl: 3 .  triamcinolone (NASACORT) 55 MCG/ACT AERO nasal inhaler, Place 2 sprays into the nose daily., Disp: , Rfl:  .  Triamcinolone Acetonide (TRIAMCINOLONE 0.1 % CREAM : EUCERIN) CREA, Apply 1 application topically 2 (two) times daily., Disp: 1 each, Rfl: 0 .  venlafaxine XR (EFFEXOR-XR) 150 MG 24 hr capsule, Take 1 capsule (150 mg total) by mouth daily., Disp: 30 capsule, Rfl: 5 .  RESTASIS 0.05 % ophthalmic emulsion, INSTILL 1 DROP INTO BOTH EYES TWICE A DAY., Disp: , Rfl: 4  Allergies  Allergen Reactions  . Ace Inhibitors Cough  . Hctz [Hydrochlorothiazide] Rash    face     ROS  Constitutional: Negative for fever or weight change.  Respiratory: Negative for cough and shortness of breath.   Cardiovascular: Negative for chest pain or palpitations.  Gastrointestinal: Negative for abdominal pain, no bowel changes.  Musculoskeletal: Negative for gait problem or joint swelling.  Skin: Negative for rash.  Neurological: Negative for dizziness or headache.  No other specific complaints in a complete review of systems (except as listed in HPI above).   Objective  Vitals:   11/07/16 1451  BP: 132/72  Pulse: (!) 107  Resp: 16  Temp: 98 F (36.7 C)   TempSrc: Oral  SpO2: 97%  Weight: 286 lb 8 oz (130 kg)  Height: 5\' 8"  (1.727 m)    Body mass index is 43.56 kg/m.  Physical Exam  Constitutional: Patient appears well-developed and well-nourished. Obese  No distress.  HEENT: head atraumatic, normocephalic, pupils equal and reactive to light,  neck supple, throat within normal limits Cardiovascular: Normal rate, regular rhythm and normal heart sounds.  No murmur heard. positive for trace BLE edema. Pulmonary/Chest: Effort normal and breath sounds normal. No respiratory distress. Abdominal: Soft.  There is no tenderness. Psychiatric: Patient has a normal mood and affect. behavior is normal. Judgment and thought  content normal. Muscular Skeletal: crepitus with extension of right knee  Recent Results (from the past 2160 hour(s))  POCT urinalysis dipstick     Status: Abnormal   Collection Time: 10/23/16  8:35 AM  Result Value Ref Range   Color, UA yellow    Clarity, UA clear    Glucose, UA negative    Bilirubin, UA negative    Ketones, UA negative    Spec Grav, UA 1.010 1.010 - 1.025   Blood, UA large    pH, UA 7.5 5.0 - 8.0   Protein, UA 30    Urobilinogen, UA 1.0 0.2 or 1.0 E.U./dL   Nitrite, UA negative    Leukocytes, UA Moderate (2+) (A) Negative  Urine Culture     Status: None   Collection Time: 10/23/16 10:02 AM  Result Value Ref Range   Organism ID, Bacteria      Single organism less than 10,000 CFU/mL isolated. These organisms,commonly found on external and internal genitalia,are considered colonizers. No further testing performed.       PHQ2/9: Depression screen [facility] 2/9 11/07/2016 08/08/2016 06/20/2016 04/01/2016 03/07/2016  Decreased Interest 0 0 0 0 0  Down, Depressed, Hopeless 0 0 0 0 0  PHQ - 2 Score 0 0 0 0 0     Fall Risk: Fall Risk  11/07/2016 08/08/2016 06/20/2016 04/01/2016 03/07/2016  Falls in the past year? No No No No Yes  Number falls in past yr: - - - - 1  Injury with Fall? - - - - No     Functional Status Survey: Is the patient deaf or have difficulty hearing?: No Does the patient have difficulty seeing, even when wearing glasses/contacts?: No Does the patient have difficulty concentrating, remembering, or making decisions?: No Does the patient have difficulty walking or climbing stairs?: No Does the patient have difficulty dressing or bathing?: No Does the patient have difficulty doing errands alone such as visiting a doctor's office or shopping?: No    Assessment & Plan  1. Essential hypertension  - amLODipine-olmesartan (AZOR) 5-40 MG tablet; Take 1 tablet by mouth daily.  Dispense: 90 tablet; Refill: 1 - cloNIDine (CATAPRES - DOSED IN MG/24 HR) 0.1 mg/24hr patch; Place 1 patch (0.1 mg total) onto the skin once a week.  Dispense: 12 patch; Refill: 1  .2. Depression, major, recurrent, mild (HCC)  - venlafaxine XR (EFFEXOR-XR) 150 MG 24 hr capsule; Take 1 capsule (150 mg total) by mouth daily.  Dispense: 90 capsule; Refill: 1  3. GAD (generalized anxiety disorder)  Only taking it prn, continue Effexor  - ALPRAZolam (XANAX) 0.5 MG tablet; Take 1 tablet (0.5 mg total) by mouth daily as needed.  Dispense: 30 tablet; Refill: 0  4. Prediabetes  Continue medication at this time - liraglutide (VICTOZA) 18 MG/3ML SOPN; Inject 0.3 mLs (1.8 mg total) into the skin daily.  Dispense: 9 mL; Refill: 3  5. OSA (obstructive sleep apnea)  Explained importance of using CPAP   6. Morbid obesity, unspecified obesity type [facility])  Discussed with the patient the risk posed by an increased BMI. Discussed importance of portion control, calorie counting and at least 150 minutes of physical activity weekly. Avoid sweet beverages and drink more water. Eat at least 6 servings of fruit and vegetables daily   7. Metabolic syndrome  - liraglutide (VICTOZA) 18 MG/3ML SOPN; Inject 0.3 mLs (1.8 mg total) into the skin daily.  Dispense: 9 mL; Refill: 3  8. Primary osteoarthritis of both  knees  Doing better,  continue Tylenol to take Meloxicam only very seldom

## 2016-12-07 ENCOUNTER — Other Ambulatory Visit: Payer: Self-pay | Admitting: Family Medicine

## 2016-12-07 DIAGNOSIS — M170 Bilateral primary osteoarthritis of knee: Secondary | ICD-10-CM

## 2017-01-10 ENCOUNTER — Other Ambulatory Visit: Payer: Self-pay | Admitting: Family Medicine

## 2017-01-10 DIAGNOSIS — M170 Bilateral primary osteoarthritis of knee: Secondary | ICD-10-CM

## 2017-02-13 ENCOUNTER — Encounter: Payer: Self-pay | Admitting: Family Medicine

## 2017-02-13 ENCOUNTER — Ambulatory Visit (INDEPENDENT_AMBULATORY_CARE_PROVIDER_SITE_OTHER): Payer: BC Managed Care – PPO | Admitting: Family Medicine

## 2017-02-13 VITALS — BP 140/80 | HR 91 | Resp 14 | Ht 68.0 in | Wt 291.5 lb

## 2017-02-13 DIAGNOSIS — G4733 Obstructive sleep apnea (adult) (pediatric): Secondary | ICD-10-CM | POA: Diagnosis not present

## 2017-02-13 DIAGNOSIS — M170 Bilateral primary osteoarthritis of knee: Secondary | ICD-10-CM | POA: Diagnosis not present

## 2017-02-13 DIAGNOSIS — Z13220 Encounter for screening for lipoid disorders: Secondary | ICD-10-CM

## 2017-02-13 DIAGNOSIS — E8881 Metabolic syndrome: Secondary | ICD-10-CM | POA: Diagnosis not present

## 2017-02-13 DIAGNOSIS — R600 Localized edema: Secondary | ICD-10-CM

## 2017-02-13 DIAGNOSIS — F330 Major depressive disorder, recurrent, mild: Secondary | ICD-10-CM | POA: Diagnosis not present

## 2017-02-13 DIAGNOSIS — F411 Generalized anxiety disorder: Secondary | ICD-10-CM | POA: Diagnosis not present

## 2017-02-13 DIAGNOSIS — R7303 Prediabetes: Secondary | ICD-10-CM

## 2017-02-13 DIAGNOSIS — I10 Essential (primary) hypertension: Secondary | ICD-10-CM

## 2017-02-13 MED ORDER — NEBIVOLOL HCL 5 MG PO TABS: 5.0000 mg | ORAL_TABLET | Freq: Every day | ORAL | 0 refills | Status: DC

## 2017-02-13 MED ORDER — SEMAGLUTIDE(0.25 OR 0.5MG/DOS) 2 MG/1.5ML ~~LOC~~ SOPN: 0.5000 mg | PEN_INJECTOR | SUBCUTANEOUS | 2 refills | Status: DC

## 2017-02-13 NOTE — Progress Notes (Signed)
Dictation #1 [id number] Name: Regina Christensen   MRN: [id number]    DOB: 1955/10/17   Date:02/13/2017       Progress Note  Subjective  Chief Complaint  No chief complaint on file.   HPI  HTN: she was  Valsartan320mg  and hydralazine, off HCTZ because of hypokalemia and it was causing facial irritation. She is on Clonidine and Azor, and bp is borderline today and at home can go up to 150's. She denies palpitations or chest pain at this time  Major Depression with anxiety: taking Effexor , she has stress with her main job, also works a part time job. Taking Alprazolam prn . No crying spells or mood swings, denies suicidal thoughts. She feels tired, but no anhedonia. She is not in remission but is afraid to change medication. She feels down a 4 times a month, and talks to a friend that lives in [place]  OA:she is now going to [facility] and had hyaluronic acid injections on both knees and is stable now. No recent effusion  Morbid Obesity: weight has been stable, she is on Victoza for pre-diabetes, but has noticed appetite is not curbed anymore. We will try switching to Ozempic.   Paresthesia: hands, going on for a long time, never had NCS, states sometimes she has shake her hands when driving. She has been wearing a brace  She is doing much better.    Acanthosis nigricans/Metabolic syndrome: she denies polyphagia, polyuria or polydipsia, last hgbA1C was 6.0, she started on Victoza Fall 2017, no side effects, she is due for repeat labs and will return next week for labs.    OSA: never able to tolerate CPAP, she has fatigue, day sleepiness, mental fogginess, she has seen dentist and is wearing a mouth piece, she states the mouth piece has helped with symptoms, however still feels like mental fogginess is not completely clear. Discussed Nuvigil/provigil that we can start on her next visit if bp is at goal   Patient Active Problem List   Diagnosis Date Noted  . Bilateral  carpal tunnel syndrome 07/30/2016  . OSA (obstructive sleep apnea) 03/07/2016  . Depression, major, recurrent, mild (HCC) 11/30/2015  . Osteoarthritis of both knees 11/30/2015  . BPPV (benign paroxysmal positional vertigo) 08/28/2015  . Essential hypertension 11/09/2014  . Hyperglycemia 11/09/2014  . GAD (generalized anxiety disorder) 11/09/2014  . Acanthosis nigricans 11/09/2014  . Morbid obesity (HCC) 01/06/2012    Past Surgical History:  Procedure Laterality Date  . ABDOMINAL HYSTERECTOMY    . BREAST EXCISIONAL BIOPSY Left   . ENDOMETRIAL ABLATION     x2  . GASTRIC BYPASS     sleeve    Family History  Problem Relation Age of Onset  . Heart disease Father   . Cancer Brother     Social History   Social History  . Marital status: Divorced    Spouse name: N/A  . Number of children: N/A  . Years of education: N/A   Occupational History  . 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 Smoking status: Never Smoker  . Smokeless tobacco: Never Used  . Alcohol use No     Comment: rare  . Drug use: No  . Sexual activity: No   Other Topics Concern  . 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  acetaminophen (TYLENOL 8 HOUR ARTHRITIS PAIN) 650 MG CR tablet, Take 1 tablet (650 mg total) by mouth every 8 (eight) hours as needed for pain. (Patient taking differently: Take 1,300 mg by mouth every 8 (eight) hours as needed for pain. ), Disp: 90 tablet, Rfl: 0 .  albuterol (PROVENTIL HFA;VENTOLIN HFA) 108 (90 Base) MCG/ACT inhaler, Inhale 2 puffs into the lungs every 6 (six) hours as needed for wheezing or shortness of breath., Disp: 1 Inhaler, Rfl: 2 .  ALPRAZolam (XANAX) 0.5 MG tablet, Take 1 tablet (0.5 mg total) by mouth daily as needed., Disp: 30 tablet, Rfl: 0 .   amLODipine-olmesartan (AZOR) 5-40 MG tablet, Take 1 tablet by mouth daily., Disp: 90 tablet, Rfl: 1 .  aspirin EC 81 MG tablet, Take 81 mg by mouth daily., Disp: , Rfl:  .  cloNIDine (CATAPRES - DOSED IN MG/24 HR) 0.1 mg/24hr patch, Place 1 patch (0.1 mg total) onto the skin once a week., Disp: 12 patch, Rfl: 1 .  fexofenadine (ALLEGRA) 180 MG tablet, Take 180 mg by mouth daily., Disp: , Rfl:  .  glucose blood test strip, Use as instructed, Disp: 100 each, Rfl: 12 .  Insulin Pen Needle (NOVOFINE) 30G X 8 MM MISC, Inject 10 each into the skin as needed., Disp: 100 each, Rfl: 0 .  LOTEMAX 0.5 % GEL, , Disp: , Rfl:  .  meloxicam (MOBIC) 7.5 MG tablet, TAKE 1 TABLET BY MOUTH EVERY DAY, Disp: 30 tablet, Rfl: 0 .  montelukast (SINGULAIR) 10 MG tablet, Take 1 tablet (10 mg total) by mouth at bedtime., Disp: 30 tablet, Rfl: 3 .  nebivolol (BYSTOLIC) 5 MG tablet, Take 1 tablet (5 mg total) by mouth daily., Disp: 90 tablet, Rfl: 0 .  RESTASIS 0.05 % ophthalmic emulsion, INSTILL 1 DROP INTO BOTH EYES TWICE A DAY., Disp: , Rfl: 4 .  Semaglutide (OZEMPIC) 0.25 or 0.5 MG/DOSE SOPN, Inject 0.5 mg into the skin once a week. In place of Victoza, Disp: 3 mL, Rfl: 2 .  triamcinolone (NASACORT) 55 MCG/ACT AERO nasal inhaler, Place 2 sprays into the nose daily., Disp: , Rfl:  .  Triamcinolone Acetonide (TRIAMCINOLONE 0.1 % CREAM : EUCERIN) CREA, Apply 1 application topically 2 (two) times daily., Disp: 1 each, Rfl: 0 .  venlafaxine XR (EFFEXOR-XR) 150 MG 24 hr capsule, Take 1 capsule (150 mg total) by mouth daily., Disp: 90 capsule, Rfl: 1  Allergies  Allergen Reactions  . Ace Inhibitors Cough  . Hctz [Hydrochlorothiazide] Rash    face     ROS  Constitutional: Negative for fever or weight change.  Respiratory: Negative for cough and shortness of breath.   Cardiovascular: Negative for chest pain or palpitations.  Gastrointestinal: Negative for abdominal pain, no bowel changes.  Musculoskeletal: Negative for  gait problem intermittent  joint swelling.  Skin: Negative for rash.  Neurological: Negative for dizziness or headache.  No other specific complaints in a complete review of systems (except as listed in HPI above).  Objective  Vitals:   02/13/17 1417  BP: 140/80  Pulse: 91  Resp: 14  SpO2: 98%  Weight: 291 lb 8.6 oz (132.2 kg)  Height: 5\' 8"  (1.727 m)    Body mass index is 44.33 kg/m.  Physical Exam  Constitutional: Patient appears well-developed and well-nourished. Obese No distress.  HEENT: head atraumatic, normocephalic, pupils equal and reactive to light, neck supple, throat within normal  limits Cardiovascular: Normal rate, regular rhythm and normal heart sounds.  No murmur heard. Trace   BLE edema. Pulmonary/Chest: Effort normal and breath sounds normal. No respiratory distress. Abdominal: Soft.  There is no tenderness. Psychiatric: Patient has a normal mood and affect. behavior is normal. Judgment and thought content normal.  PHQ2/9: Depression screen [facility] 2/9 11/07/2016 08/08/2016 06/20/2016 04/01/2016 03/07/2016  Decreased Interest 0 0 0 0 0  Down, Depressed, Hopeless 0 0 0 0 0  PHQ - 2 Score 0 0 0 0 0    Fall Risk: Fall Risk  02/13/2017 11/07/2016 08/08/2016 06/20/2016 04/01/2016  Falls in the past year? No No No No No  Number falls in past yr: - - - - -  Injury with Fall? - - - - -    Functional Status Survey: Is the patient deaf or have difficulty hearing?: No Does the patient have difficulty seeing, even when wearing glasses/contacts?: No Does the patient have difficulty concentrating, remembering, or making decisions?: No Does the patient have difficulty walking or climbing stairs?: No Does the patient have difficulty dressing or bathing?: No Does the patient have difficulty doing errands alone such as visiting a doctor's office or shopping?: No    Assessment & Plan  1. Essential hypertension  - CBC - COMPLETE METABOLIC PANEL WITH GFR  2. Depression,  major, recurrent, mild (HCC)  Still has down days  3. GAD (generalized anxiety disorder)  Taking medication prn   4. Prediabetes  - Hemoglobin A1c - Insulin, random  5. OSA (obstructive sleep apnea)  Wearing a mouth piece, but still feels tired during the day and lack of focus, we will get bp better and add Nuvigil/provigil next visit.   6. Morbid obesity, unspecified obesity type (HCC)  We will change from Victoza to Ozempic  7. Metabolic syndrome  - Hemoglobin A1c - Insulin, random  8. Primary osteoarthritis of both knees  stable  9. Edema of both legs  She does not want to go up on dose of Norvasc  10. Lipid screening  - Lipid panel

## 2017-02-13 NOTE — Patient Instructions (Signed)
Finish Victoza but go up to 0.6 plus 1.8 daily at the same time Once finished with Victoza, switch to samples of Ozempic at 0.5 mg weekly Adding a new medication for bp called [employer]

## 2017-02-14 ENCOUNTER — Other Ambulatory Visit: Payer: Self-pay | Admitting: Family Medicine

## 2017-02-14 DIAGNOSIS — M170 Bilateral primary osteoarthritis of knee: Secondary | ICD-10-CM

## 2017-02-17 ENCOUNTER — Telehealth: Payer: Self-pay | Admitting: Family Medicine

## 2017-02-17 NOTE — Telephone Encounter (Signed)
Pt was given a new BP meds on Friday. Pt is feeling fatigue,headache and nausea. Pt wants to know how long will she have the side affects before she starts to feel better. Please advise pt.

## 2017-02-17 NOTE — Telephone Encounter (Signed)
I don' think it is related to new bp medication.  Can she come in?  What is her bp running at home?

## 2017-02-17 NOTE — Telephone Encounter (Signed)
Patient states she has just been really fatigue and has not checked her BP. She has had some headaches and just laying around.  Also having sinus issues. Will check her BP tonight and call us back with her readings.

## 2017-02-18 NOTE — Telephone Encounter (Signed)
PT SAID THAT LAST NIGHT HER BP WAS 162/87 AND THIS MORNING WAS 143/86. JUST FYI

## 2017-02-20 ENCOUNTER — Ambulatory Visit (INDEPENDENT_AMBULATORY_CARE_PROVIDER_SITE_OTHER): Payer: BC Managed Care – PPO | Admitting: Family Medicine

## 2017-02-20 ENCOUNTER — Encounter: Payer: Self-pay | Admitting: Family Medicine

## 2017-02-20 VITALS — BP 124/82 | HR 82 | Temp 98.4°F | Resp 16 | Ht 68.0 in | Wt 290.7 lb

## 2017-02-20 DIAGNOSIS — R51 Headache: Secondary | ICD-10-CM

## 2017-02-20 DIAGNOSIS — I10 Essential (primary) hypertension: Secondary | ICD-10-CM | POA: Diagnosis not present

## 2017-02-20 DIAGNOSIS — J0110 Acute frontal sinusitis, unspecified: Secondary | ICD-10-CM

## 2017-02-20 DIAGNOSIS — R519 Headache, unspecified: Secondary | ICD-10-CM

## 2017-02-20 MED ORDER — KETOROLAC TROMETHAMINE 60 MG/2ML IM SOLN
60.0000 mg | Freq: Once | INTRAMUSCULAR | Status: AC
  Administered 2017-02-20: 60 mg via INTRAMUSCULAR

## 2017-02-20 MED ORDER — AMOXICILLIN-POT CLAVULANATE 875-125 MG PO TABS: 1.0000 | ORAL_TABLET | Freq: Two times a day (BID) | ORAL | 0 refills | Status: AC

## 2017-02-20 NOTE — Progress Notes (Signed)
Name: Regina Christensen   MRN: [id number]    DOB: 04/04/1956   Date:02/20/2017       Progress Note  Subjective  Chief Complaint  Chief Complaint  Patient presents with  . Sinusitis  . Headache    patient think this is coming from change in BP medication    HPI  Pt presents with concern for headache intermittently x7 days.  She does endorse headache at present 6/10 and is sinus/frontal in nature - has not taken any medication for her headache yet.  HTN:She did start Bystolic on 02/13/2017 for HTN, BP has been running 140/80-90's in the morning before taking medications, then comes down as the day goes on.  This afternoon her BP is 124/82 in the office.    She also notes sinus congestion x7-9 days - says she always has allergies this time of year; LEFT eye watery, LEFT ear has full sensation.  No cough, chest pain, shortness of breath, confusion, facial droop, extremity weakness, vision changes.  She is prescribed Meloxicam but has not taken it in over 2 weeks.  Taking nasacort but not daily; has not been taking singulair; is taking allegra daily but not taking it daily.  Patient Active Problem List   Diagnosis Date Noted  . Bilateral carpal tunnel syndrome 07/30/2016  . OSA (obstructive sleep apnea) 03/07/2016  . Depression, major, recurrent, mild (HCC) 11/30/2015  . Osteoarthritis of both knees 11/30/2015  . BPPV (benign paroxysmal positional vertigo) 08/28/2015  . Essential hypertension 11/09/2014  . Hyperglycemia 11/09/2014  . GAD (generalized anxiety disorder) 11/09/2014  . Acanthosis nigricans 11/09/2014  . Morbid obesity (HCC) 01/06/2012    Social History  Substance Use Topics  . Smoking status: Never Smoker  . Smokeless tobacco: Never Used  . Alcohol use No     Comment: rare     Current Outpatient Prescriptions:  .  acetaminophen (TYLENOL 8 HOUR ARTHRITIS PAIN) 650 MG CR tablet, Take 1 tablet (650 mg total) by mouth every 8 (eight) hours as needed for pain. (Patient  taking differently: Take 1,300 mg by mouth every 8 (eight) hours as needed for pain. ), Disp: 90 tablet, Rfl: 0 .  albuterol (PROVENTIL HFA;VENTOLIN HFA) 108 (90 Base) MCG/ACT inhaler, Inhale 2 puffs into the lungs every 6 (six) hours as needed for wheezing or shortness of breath., Disp: 1 Inhaler, Rfl: 2 .  ALPRAZolam (XANAX) 0.5 MG tablet, Take 1 tablet (0.5 mg total) by mouth daily as needed., Disp: 30 tablet, Rfl: 0 .  amLODipine-olmesartan (AZOR) 5-40 MG tablet, Take 1 tablet by mouth daily., Disp: 90 tablet, Rfl: 1 .  aspirin EC 81 MG tablet, Take 81 mg by mouth daily., Disp: , Rfl:  .  cloNIDine (CATAPRES - DOSED IN MG/24 HR) 0.1 mg/24hr patch, Place 1 patch (0.1 mg total) onto the skin once a week., Disp: 12 patch, Rfl: 1 .  fexofenadine (ALLEGRA) 180 MG tablet, Take 180 mg by mouth daily., Disp: , Rfl:  .  glucose blood test strip, Use as instructed, Disp: 100 each, Rfl: 12 .  Insulin Pen Needle (NOVOFINE) 30G X 8 MM MISC, Inject 10 each into the skin as needed., Disp: 100 each, Rfl: 0 .  LOTEMAX 0.5 % GEL, , Disp: , Rfl:  .  meloxicam (MOBIC) 7.5 MG tablet, TAKE 1 TABLET BY MOUTH EVERY DAY, Disp: 30 tablet, Rfl: 0 .  montelukast (SINGULAIR) 10 MG tablet, Take 1 tablet (10 mg total) by mouth at bedtime., Disp: 30 tablet, Rfl: 3 .  nebivolol (BYSTOLIC) 5 MG tablet, Take 1 tablet (5 mg total) by mouth daily., Disp: 90 tablet, Rfl: 0 .  RESTASIS 0.05 % ophthalmic emulsion, INSTILL 1 DROP INTO BOTH EYES TWICE A DAY., Disp: , Rfl: 4 .  Semaglutide (OZEMPIC) 0.25 or 0.5 MG/DOSE SOPN, Inject 0.5 mg into the skin once a week. In place of Victoza, Disp: 3 mL, Rfl: 2 .  triamcinolone (NASACORT) 55 MCG/ACT AERO nasal inhaler, Place 2 sprays into the nose daily., Disp: , Rfl:  .  Triamcinolone Acetonide (TRIAMCINOLONE 0.1 % CREAM : EUCERIN) CREA, Apply 1 application topically 2 (two) times daily., Disp: 1 each, Rfl: 0 .  venlafaxine XR (EFFEXOR-XR) 150 MG 24 hr capsule, Take 1 capsule (150 mg total) by  mouth daily., Disp: 90 capsule, Rfl: 1  Allergies  Allergen Reactions  . Ace Inhibitors Cough  . Hctz [Hydrochlorothiazide] Rash    face    ROS  Ten systems reviewed and is negative except as mentioned in HPI  Objective  Vitals:   02/20/17 1417  BP: 124/82  Pulse: 82  Resp: 16  Temp: 98.4 F (36.9 C)  TempSrc: Oral  SpO2: 96%  Weight: 290 lb 11.2 oz (131.9 kg)  Height: 5\' 8"  (1.727 m)   Body mass index is 44.2 kg/m.  Nursing Note and Vital Signs reviewed.  Physical Exam Constitutional: Patient appears well-developed and well-nourished. Obese No distress.  HEENT: head atraumatic, normocephalic, pupils equal and reactive to light, EOM's intact, TM's without erythema or bulging, RIGHT frontal sinus acutely tender on palpation, LEFT frontal sinus mildly tender, bilateral maxillary are non-tender; turbinates inflamed; neck supple without lymphadenopathy, oropharynx pink and moist without exudate Cardiovascular: Normal rate, regular rhythm, S1/S2 present.  No murmur or rub heard. No BLE edema. Pulmonary/Chest: Effort normal and breath sounds clear. No respiratory distress or retractions. Psychiatric: Patient has a normal mood and affect. behavior is normal. Judgment and thought content normal. Musculoskeletal: Normal range of motion, no joint effusions. No gross deformities Neurological: she is alert and oriented to person, place, and time. No cranial nerve deficit. Coordination, balance, strength, speech and gait are normal.   No results found for this or any previous visit (from the past 2160 hour(s)).   Assessment & Plan  1. Acute non-recurrent frontal sinusitis - amoxicillin-clavulanate (AUGMENTIN) 875-125 MG tablet; Take 1 tablet by mouth 2 (two) times daily.  Dispense: 20 tablet; Refill: 0 - Nasacort and Allegra daily; saline nasal spray PRN - Drink plenty of fluids.  2. Essential hypertension Continue course of medication. Continue to check BP at home - may bring  cuff in for calibration if needed  3. Sinus headache - ketorolac (TORADOL) injection 60 mg; Inject 2 mLs (60 mg total) into the muscle once.  - Return if symptoms worsen or fail to improve, for 2-3 days. -Red flags and when to present for emergency care or RTC including fever >101.16F, chest pain, shortness of breath, new/worsening/un-resolving symptoms, confusion, weakness, vision changes/pain with ocular movement, severe headache, reviewed with patient at time of visit. Follow up and care instructions discussed and provided in AVS.

## 2017-02-20 NOTE — Patient Instructions (Addendum)
- Use nasacort and allegra daily.  May also use your saline nasal spray as needed. - Take your antibiotic twice daily until completed. - DO NOT TAKE your Meloxicam (Mobic) for 2 days. - Take a probiotic while taking your antibiotic and for 7 days after finishing.  Sinusitis, Adult Sinusitis is soreness and inflammation of your sinuses. Sinuses are hollow spaces in the bones around your face. They are located:  Around your eyes.  In the middle of your forehead.  Behind your nose.  In your cheekbones.  Your sinuses and nasal passages are lined with a stringy fluid (mucus). Mucus normally drains out of your sinuses. When your nasal tissues get inflamed or swollen, the mucus can get trapped or blocked so air cannot flow through your sinuses. This lets bacteria, viruses, and funguses grow, and that leads to infection. Follow these instructions at home: Medicines  Take, use, or apply over-the-counter and prescription medicines only as told by your doctor. These may include nasal sprays.  If you were prescribed an antibiotic medicine, take it as told by your doctor. Do not stop taking the antibiotic even if you start to feel better. Hydrate and Humidify  Drink enough water to keep your pee (urine) clear or pale yellow.  Use a cool mist humidifier to keep the humidity level in your home above 50%.  Breathe in steam for 10-15 minutes, 3-4 times a day or as told by your doctor. You can do this in the bathroom while a hot shower is running.  Try not to spend time in cool or dry air. Rest  Rest as much as possible.  Sleep with your head raised (elevated).  Make sure to get enough sleep each night. General instructions  Put a warm, moist washcloth on your face 3-4 times a day or as told by your doctor. This will help with discomfort.  Wash your hands often with soap and water. If there is no soap and water, use hand sanitizer.  Do not smoke. Avoid being around people who are smoking  (secondhand smoke).  Keep all follow-up visits as told by your doctor. This is important. Contact a doctor if:  You have a fever.  Your symptoms get worse.  Your symptoms do not get better within 10 days. Get help right away if:  You have a very bad headache.  You cannot stop throwing up (vomiting).  You have pain or swelling around your face or eyes.  You have trouble seeing.  You feel confused.  Your neck is stiff.  You have trouble breathing. This information is not intended to replace advice given to you by your health care provider. Make sure you discuss any questions you have with your health care provider. Document Released: 09/24/2007 Document Revised: 12/02/2015 Document Reviewed: 01/31/2015 Elsevier Interactive Patient Education  2018 [employer]  Sinus Headache A sinus headache happens when your sinuses become clogged or swollen. You may feel pain or pressure in your face, forehead, ears, or upper teeth. Sinus headaches can be mild or severe. Follow these instructions at home:  Take medicines only as told by your doctor.  If you were given an antibiotic medicine, finish all of it even if you start to feel better.  Use a nose spray if you feel stuffed up (congested).  If told, apply a warm, moist washcloth to your face to help lessen pain. Contact a doctor if:  You get headaches more than one time each week.  Light or sound bothers you.  You have a fever.  You feel sick to your stomach (nauseous) or you throw up (vomit).  Your headaches do not get better with treatment. Get help right away if:  You have trouble seeing.  You suddenly have very bad pain in your face or head.  You start to twitch or shake (seizure).  You are confused.  You have a stiff neck. This information is not intended to replace advice given to you by your health care provider. Make sure you discuss any questions you have with your health care provider. Document Released:  08/07/2010 Document Revised: 12/02/2015 Document Reviewed: 04/03/2014 Elsevier Interactive Patient Education  [employer].

## 2017-02-25 LAB — CBC
HCT: 39.9 % (ref 35.0–45.0)
Hemoglobin: 13.5 g/dL (ref 11.7–15.5)
MCH: 28.7 pg (ref 27.0–33.0)
MCHC: 33.8 g/dL (ref 32.0–36.0)
MCV: 84.9 fL (ref 80.0–100.0)
MPV: 10.8 fL (ref 7.5–12.5)
Platelets: 329 10*3/uL (ref 140–400)
RBC: 4.7 10*6/uL (ref 3.80–5.10)
RDW: 14.6 % (ref 11.0–15.0)
WBC: 9.2 10*3/uL (ref 3.8–10.8)

## 2017-02-25 LAB — COMPLETE METABOLIC PANEL WITH GFR
AG Ratio: 1.3 (calc) (ref 1.0–2.5)
ALT: 12 U/L (ref 6–29)
AST: 12 U/L (ref 10–35)
Albumin: 3.9 g/dL (ref 3.6–5.1)
Alkaline phosphatase (APISO): 93 U/L (ref 33–130)
BUN: 12 mg/dL (ref 7–25)
CO2: 27 mmol/L (ref 20–32)
Calcium: 9.2 mg/dL (ref 8.6–10.4)
Chloride: 105 mmol/L (ref 98–110)
Creat: 0.74 mg/dL (ref 0.50–0.99)
GFR, Est African American: 102 mL/min/{1.73_m2} (ref >=60)
GFR, Est Non African American: 88 mL/min/{1.73_m2} (ref >=60)
Globulin: 3.1 g/dL (calc) (ref 1.9–3.7)
Glucose, Bld: 85 mg/dL (ref 65–99)
Potassium: 3.6 mmol/L (ref 3.5–5.3)
Sodium: 140 mmol/L (ref 135–146)
Total Bilirubin: 0.4 mg/dL (ref 0.2–1.2)
Total Protein: 7 g/dL (ref 6.1–8.1)

## 2017-02-25 LAB — LIPID PANEL
Cholesterol: 145 mg/dL (ref <200)
HDL: 86 mg/dL (ref >50)
LDL Cholesterol (Calc): 41 mg/dL (calc)
Non-HDL Cholesterol (Calc): 59 mg/dL (calc) (ref <130)
Total CHOL/HDL Ratio: 1.7 (calc) (ref <5.0)
Triglycerides: 92 mg/dL (ref <150)

## 2017-02-25 LAB — HEMOGLOBIN A1C
Hgb A1c MFr Bld: 5.8 % of total Hgb — ABNORMAL HIGH (ref <5.7)
Mean Plasma Glucose: 120 (calc)
eAG (mmol/L): 6.6 (calc)

## 2017-02-25 LAB — INSULIN, RANDOM: Insulin: 14.9 u[IU]/mL (ref 2.0–19.6)

## 2017-03-19 ENCOUNTER — Other Ambulatory Visit: Payer: Self-pay | Admitting: Family Medicine

## 2017-03-19 DIAGNOSIS — M170 Bilateral primary osteoarthritis of knee: Secondary | ICD-10-CM

## 2017-03-19 NOTE — Telephone Encounter (Signed)
Patient requesting refill of Meloxicam to [facility].  Last visit: 02/13/2017  Next visit: 03/27/2017

## 2017-03-27 ENCOUNTER — Encounter: Payer: Self-pay | Admitting: Family Medicine

## 2017-03-27 ENCOUNTER — Ambulatory Visit: Payer: BC Managed Care – PPO | Admitting: Family Medicine

## 2017-03-27 VITALS — BP 120/78 | HR 88 | Resp 14 | Ht 68.0 in | Wt 290.4 lb

## 2017-03-27 DIAGNOSIS — J069 Acute upper respiratory infection, unspecified: Secondary | ICD-10-CM

## 2017-03-27 DIAGNOSIS — I10 Essential (primary) hypertension: Secondary | ICD-10-CM

## 2017-03-27 DIAGNOSIS — E8881 Metabolic syndrome: Secondary | ICD-10-CM | POA: Diagnosis not present

## 2017-03-27 DIAGNOSIS — G4733 Obstructive sleep apnea (adult) (pediatric): Secondary | ICD-10-CM

## 2017-03-27 MED ORDER — ARMODAFINIL 150 MG PO TABS: 150.0000 mg | ORAL_TABLET | Freq: Every day | ORAL | 2 refills | Status: DC

## 2017-03-27 MED ORDER — SEMAGLUTIDE (1 MG/DOSE) 2 MG/1.5ML ~~LOC~~ SOPN: 1.0000 mg | PEN_INJECTOR | SUBCUTANEOUS | 0 refills | Status: DC

## 2017-03-27 NOTE — Progress Notes (Addendum)
Name: Regina Christensen   MRN: [id number]    DOB: Jul 31, 1955   Date:03/27/2017       Progress Note  Subjective  Chief Complaint  Chief Complaint  Patient presents with  . Hypertension    HPI  HTN: she was Valsartan320mg  and hydralazine, off HCTZ because of hypokalemia and it was causing facial irritation. She is on Clonidine and Azor,we started her on Bystolic 01/2017  and she is doing well , no side effects of medication and bp is at goal   Morbid Obesity: weight has been stable, she is on Victoza for pre-diabetes, but has noticed appetite is not curbed anymore. We switched to Ozempic and since she is tolerating it well, we will increase dose to 1 mg weekly.   Acanthosis nigricans/Metabolic syndrome: she denies polyphagia, polyuria or polydipsia, last hgbA1C was down from  6.0% to 5.8% she started on Victoza Fall 2017, no side effects, reviewed labs with her, no symptoms of pancreatitis, she was switched from Victoza to [facility] 01/2017, just now on 0.5 mg but states not working to curb her appetite, we will try increasing dose.   URI: she states she noticed bilateral ear pain since this morning, but right worse than left, also has mild sore throat, no fever or chills.   OSA: never able to tolerate CPAP, she has fatigue, day sleepiness, mental fogginess, she has seen dentist and is wearing a mouth piece, she states the mouth piece has helped with symptoms, however still feels like mental fogginess is not completely clear. She would like to try Nuvigil and Provigil and we can try since bp is now at goal    Patient Active Problem List   Diagnosis Date Noted  . Bilateral carpal tunnel syndrome 07/30/2016  . OSA (obstructive sleep apnea) 03/07/2016  . Depression, major, recurrent, mild (HCC) 11/30/2015  . Osteoarthritis of both knees 11/30/2015  . BPPV (benign paroxysmal positional vertigo) 08/28/2015  . Essential hypertension 11/09/2014  . Hyperglycemia 11/09/2014  . GAD (generalized  anxiety disorder) 11/09/2014  . Acanthosis nigricans 11/09/2014  . Morbid obesity (HCC) 01/06/2012    Past Surgical History:  Procedure Laterality Date  . ABDOMINAL HYSTERECTOMY    . BREAST EXCISIONAL BIOPSY Left   . ENDOMETRIAL ABLATION     x2  . GASTRIC BYPASS     sleeve    Family History  Problem Relation Age of Onset  . Heart disease Father   . Cancer Brother     Social History   Socioeconomic History  . Marital status: Divorced    Spouse name: Not on file  . Number of children: Not on file  . Years of education: Not on file  . Highest education level: Not on file  Social Needs  . Financial resource strain: Not on file  . Food insecurity - worry: Not on file  . Food insecurity - inability: Not on file  . Transportation needs - medical: Not on file  . Transportation needs - non-medical: Not on file  Occupational History  . Occupation: patient Tax adviser: [employer]  Tobacco Use  . Smoking status: Never Smoker  . Smokeless tobacco: Never Used  Substance and Sexual Activity  . Alcohol use: No    Alcohol/week: 0.0 oz    Comment: rare  . Drug use: No  . Sexual activity: No  Other Topics Concern  . 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  acetaminophen (TYLENOL 8 HOUR ARTHRITIS PAIN) 650 MG CR tablet, Take 1 tablet (650 mg total) by mouth every 8 (eight) hours as needed for pain. (Patient taking differently: Take 1,300 mg by mouth every 8 (eight) hours as needed for pain. ), Disp: 90 tablet, Rfl: 0 .  albuterol (PROVENTIL HFA;VENTOLIN HFA) 108 (90 Base) MCG/ACT inhaler, Inhale 2 puffs into the lungs every 6 (six) hours as needed for wheezing or shortness of breath., Disp: 1 Inhaler, Rfl: 2 .  ALPRAZolam (XANAX) 0.5 MG tablet, Take 1 tablet (0.5 mg total) by mouth daily as needed.,  Disp: 30 tablet, Rfl: 0 .  amLODipine-olmesartan (AZOR) 5-40 MG tablet, Take 1 tablet by mouth daily., Disp: 90 tablet, Rfl: 1 .  aspirin EC 81 MG tablet, Take 81 mg by mouth daily., Disp: , Rfl:  .  cloNIDine (CATAPRES - DOSED IN MG/24 HR) 0.1 mg/24hr patch, Place 1 patch (0.1 mg total) onto the skin once a week., Disp: 12 patch, Rfl: 1 .  fexofenadine (ALLEGRA) 180 MG tablet, Take 180 mg by mouth daily., Disp: , Rfl:  .  glucose blood test strip, Use as instructed, Disp: 100 each, Rfl: 12 .  LOTEMAX 0.5 % GEL, , Disp: , Rfl:  .  meloxicam (MOBIC) 7.5 MG tablet, TAKE 1 TABLET BY MOUTH EVERY DAY, Disp: 30 tablet, Rfl: 0 .  montelukast (SINGULAIR) 10 MG tablet, Take 1 tablet (10 mg total) by mouth at bedtime., Disp: 30 tablet, Rfl: 3 .  nebivolol (BYSTOLIC) 5 MG tablet, Take 1 tablet (5 mg total) by mouth daily., Disp: 90 tablet, Rfl: 0 .  RESTASIS 0.05 % ophthalmic emulsion, INSTILL 1 DROP INTO BOTH EYES TWICE A DAY., Disp: , Rfl: 4 .  triamcinolone (NASACORT) 55 MCG/ACT AERO nasal inhaler, Place 2 sprays into the nose daily., Disp: , Rfl:  .  Triamcinolone Acetonide (TRIAMCINOLONE 0.1 % CREAM : EUCERIN) CREA, Apply 1 application topically 2 (two) times daily., Disp: 1 each, Rfl: 0 .  venlafaxine XR (EFFEXOR-XR) 150 MG 24 hr capsule, Take 1 capsule (150 mg total) by mouth daily., Disp: 90 capsule, Rfl: 1 .  XIIDRA 5 % SOLN, Place 1 drop into both eyes 2 (two) times daily., Disp: , Rfl: 4  Allergies  Allergen Reactions  . Ace Inhibitors Cough  . Hctz [Hydrochlorothiazide] Rash    face     ROS  Ten systems reviewed and is negative except as mentioned in HPI   Objective  Vitals:   03/27/17 1337  BP: 120/78  Pulse: 88  Resp: 14  SpO2: 98%  Weight: 290 lb 6.4 oz (131.7 kg)  Height: 5\' 8"  (1.727 m)    Body mass index is 44.16 kg/m.  Physical Exam  Constitutional: Patient appears well-developed and well-nourished. Obese No distress.  HEENT: head atraumatic, normocephalic,  pupils equal and reactive to light, ears normal TM bilaterally, neck supple, throat within normal limits Cardiovascular: Normal rate, regular rhythm and normal heart sounds.  No murmur heard. 1 plus  BLE edema. Pulmonary/Chest: Effort normal and breath sounds normal. No respiratory distress. Abdominal: Soft.  There is no tenderness. Psychiatric: Patient has a normal mood and affect. behavior is normal. Judgment and thought content normal.  Recent Results (from the past 2160 hour(s))  CBC     Status: None   Collection Time: 02/24/17  8:56  AM  Result Value Ref Range   WBC 9.2 3.8 - 10.8 Thousand/uL   RBC 4.70 3.80 - 5.10 Million/uL   Hemoglobin 13.5 11.7 - 15.5 g/dL   HCT 16.1 09.6 - 04.5 %   MCV 84.9 80.0 - 100.0 fL   MCH 28.7 27.0 - 33.0 pg   MCHC 33.8 32.0 - 36.0 g/dL   RDW 40.9 81.1 - 91.4 %   Platelets 329 140 - 400 Thousand/uL   MPV 10.8 7.5 - 12.5 fL  COMPLETE METABOLIC PANEL WITH GFR     Status: None   Collection Time: 02/24/17  8:56 AM  Result Value Ref Range   Glucose, Bld 85 65 - 99 mg/dL    Comment: .            Fasting reference interval .    BUN 12 7 - 25 mg/dL   Creat 7.82 9.56 - 2.13 mg/dL    Comment: For patients >48 years of age, the reference limit for Creatinine is approximately 13% higher for people identified as African-American. .    GFR, Est Non African American 88 > OR = 60 mL/min/1.67m2   GFR, Est African American 102 > OR = 60 mL/min/1.15m2   BUN/Creatinine Ratio NOT APPLICABLE 6 - 22 (calc)   Sodium 140 135 - 146 mmol/L   Potassium 3.6 3.5 - 5.3 mmol/L   Chloride 105 98 - 110 mmol/L   CO2 27 20 - 32 mmol/L   Calcium 9.2 8.6 - 10.4 mg/dL   Total Protein 7.0 6.1 - 8.1 g/dL   Albumin 3.9 3.6 - 5.1 g/dL   Globulin 3.1 1.9 - 3.7 g/dL (calc)   AG Ratio 1.3 1.0 - 2.5 (calc)   Total Bilirubin 0.4 0.2 - 1.2 mg/dL   Alkaline phosphatase (APISO) 93 33 - 130 U/L   AST 12 10 - 35 U/L   ALT 12 6 - 29 U/L  Hemoglobin A1c     Status: Abnormal   Collection  Time: 02/24/17  8:56 AM  Result Value Ref Range   Hgb A1c MFr Bld 5.8 (H) <5.7 % of total Hgb    Comment: For someone without known diabetes, a hemoglobin  A1c value between 5.7% and 6.4% is consistent with prediabetes and should be confirmed with a  follow-up test. . For someone with known diabetes, a value <7% indicates that their diabetes is well controlled. A1c targets should be individualized based on duration of diabetes, age, comorbid conditions, and other considerations. . This assay result is consistent with an increased risk of diabetes. . Currently, no consensus exists regarding use of hemoglobin A1c for diagnosis of diabetes for children. .    Mean Plasma Glucose 120 (calc)   eAG (mmol/L) 6.6 (calc)  Lipid panel     Status: None   Collection Time: 02/24/17  8:56 AM  Result Value Ref Range   Cholesterol 145 <200 mg/dL   HDL 86 >08 mg/dL   Triglycerides 92 <657 mg/dL   LDL Cholesterol (Calc) 41 mg/dL (calc)    Comment: Reference range: <100 . Desirable range <100 mg/dL for primary prevention;   <70 mg/dL for patients with CHD or diabetic patients  with > or = 2 CHD risk factors. Marland Kitchen LDL-C is now calculated using the Martin-Hopkins  calculation, which is a validated novel method providing  better accuracy than the Friedewald equation in the  estimation of LDL-C.  Horald Pollen et al. Lenox Ahr. 8469;629(52): 2061-2068  (http://education.[facility].com/faq/FAQ164)    Total CHOL/HDL Ratio 1.7 <  5.0 (calc)   Non-HDL Cholesterol (Calc) 59 <161<130 mg/dL (calc)    Comment: For patients with diabetes plus 1 major ASCVD risk  factor, treating to a non-HDL-C goal of <100 mg/dL  (LDL-C of <09<70 mg/dL) is considered a therapeutic  option.   Insulin, random     Status: None   Collection Time: 02/24/17  8:56 AM  Result Value Ref Range   Insulin 14.9 2.0 - 19.6 uIU/mL    Comment: This insulin assay shows strong cross-reactivity for some insulin analogs (lispro, aspart, and  glargine) and much lower cross-reactivity with others (detemir, glulisine).      PHQ2/9: Depression screen [facility]HQ 2/9 11/07/2016 08/08/2016 06/20/2016 04/01/2016 03/07/2016  Decreased Interest 0 0 0 0 0  Down, Depressed, Hopeless 0 0 0 0 0  PHQ - 2 Score 0 0 0 0 0     Fall Risk: Fall Risk  03/27/2017 02/13/2017 11/07/2016 08/08/2016 06/20/2016  Falls in the past year? No No No No No  Number falls in past yr: - - - - -  Injury with Fall? - - - - -     Functional Status Survey: Is the patient deaf or have difficulty hearing?: No Does the patient have difficulty seeing, even when wearing glasses/contacts?: No Does the patient have difficulty concentrating, remembering, or making decisions?: No Does the patient have difficulty walking or climbing stairs?: No Does the patient have difficulty dressing or bathing?: No Does the patient have difficulty doing errands alone such as visiting a doctor's office or shopping?: No    Assessment & Plan  1. Essential hypertension  Doing well, bp is at goal   2. Metabolic syndrome  Last hgbA1C still above normal, she was on Victoza but is now on Ozempic and we will try increasing dose  3. Morbid obesity, unspecified obesity type (HCC)  She has not noticed decrease in appetite we will adjust dose to 1 mg daily   4. Viral upper respiratory tract infection  Discussed taking zinc otc, also fluids, rest and saline spray   5. OSA (obstructive sleep apnea)  - Armodafinil 150 MG tablet; Take 1 tablet (150 mg total) by mouth daily.  Dispense: 30 tablet; Refill: 2

## 2017-03-27 NOTE — Addendum Note (Signed)
Addended by: Alba CorySOWLES, Asra Gambrel F on: 03/27/2017 02:22 PM   Modules accepted: Orders

## 2017-04-28 ENCOUNTER — Other Ambulatory Visit: Payer: Self-pay | Admitting: Family Medicine

## 2017-04-28 DIAGNOSIS — M170 Bilateral primary osteoarthritis of knee: Secondary | ICD-10-CM

## 2017-05-11 ENCOUNTER — Other Ambulatory Visit: Payer: Self-pay | Admitting: Family Medicine

## 2017-05-11 DIAGNOSIS — I10 Essential (primary) hypertension: Secondary | ICD-10-CM

## 2017-05-11 NOTE — Telephone Encounter (Signed)
Refill request for Hypertension medication:  Amlodipine-Olmesartan 5-40 mg  Last office visit pertaining to hypertension: 03/27/2017  Follow up visit 07/10/2017  BP Readings from Last 3 Encounters:  03/27/17 120/78  02/20/17 124/82  02/13/17 140/80     Lab Results  Component Value Date   CREATININE 0.74 02/24/2017   BUN 12 02/24/2017   NA 140 02/24/2017   K 3.6 02/24/2017   CL 105 02/24/2017   CO2 27 02/24/2017

## 2017-05-12 ENCOUNTER — Other Ambulatory Visit: Payer: Self-pay | Admitting: Family Medicine

## 2017-05-12 NOTE — Telephone Encounter (Signed)
Refill request for Hypertension medication:  Bystolic 5 mg  Last office visit pertaining to hypertension: 03/27/2017  BP Readings from Last 3 Encounters:  03/27/17 120/78  02/20/17 124/82  02/13/17 140/80     Lab Results  Component Value Date   CREATININE 0.74 02/24/2017   BUN 12 02/24/2017   NA 140 02/24/2017   K 3.6 02/24/2017   CL 105 02/24/2017   CO2 27 02/24/2017   Follow-up on file. 07/10/2017

## 2017-05-19 ENCOUNTER — Other Ambulatory Visit: Payer: Self-pay | Admitting: Family Medicine

## 2017-05-19 DIAGNOSIS — I10 Essential (primary) hypertension: Secondary | ICD-10-CM

## 2017-05-31 ENCOUNTER — Other Ambulatory Visit: Payer: Self-pay | Admitting: Family Medicine

## 2017-05-31 DIAGNOSIS — F330 Major depressive disorder, recurrent, mild: Secondary | ICD-10-CM

## 2017-06-25 ENCOUNTER — Other Ambulatory Visit: Payer: Self-pay | Admitting: Family Medicine

## 2017-06-25 DIAGNOSIS — E8881 Metabolic syndrome: Secondary | ICD-10-CM

## 2017-07-10 ENCOUNTER — Encounter: Payer: Self-pay | Admitting: Family Medicine

## 2017-07-10 ENCOUNTER — Other Ambulatory Visit: Payer: Self-pay

## 2017-07-10 ENCOUNTER — Ambulatory Visit: Payer: BC Managed Care – PPO | Admitting: Family Medicine

## 2017-07-10 VITALS — BP 122/76 | HR 94 | Temp 97.7°F | Resp 16 | Ht 68.0 in | Wt 280.0 lb

## 2017-07-10 DIAGNOSIS — G4733 Obstructive sleep apnea (adult) (pediatric): Secondary | ICD-10-CM

## 2017-07-10 DIAGNOSIS — E8881 Metabolic syndrome: Secondary | ICD-10-CM

## 2017-07-10 DIAGNOSIS — F411 Generalized anxiety disorder: Secondary | ICD-10-CM | POA: Diagnosis not present

## 2017-07-10 DIAGNOSIS — F330 Major depressive disorder, recurrent, mild: Secondary | ICD-10-CM | POA: Diagnosis not present

## 2017-07-10 DIAGNOSIS — I10 Essential (primary) hypertension: Secondary | ICD-10-CM | POA: Diagnosis not present

## 2017-07-10 DIAGNOSIS — R7303 Prediabetes: Secondary | ICD-10-CM

## 2017-07-10 MED ORDER — ALBUTEROL SULFATE HFA 108 (90 BASE) MCG/ACT IN AERS: 2.0000 | INHALATION_SPRAY | Freq: Four times a day (QID) | RESPIRATORY_TRACT | 0 refills | Status: DC | PRN

## 2017-07-10 MED ORDER — VENLAFAXINE HCL ER 150 MG PO CP24: ORAL_CAPSULE | ORAL | 0 refills | Status: DC

## 2017-07-10 MED ORDER — SEMAGLUTIDE (1 MG/DOSE) 2 MG/1.5ML ~~LOC~~ SOPN: 1.0000 mg | PEN_INJECTOR | SUBCUTANEOUS | 1 refills | Status: DC

## 2017-07-10 MED ORDER — NEBIVOLOL HCL 5 MG PO TABS: 5.0000 mg | ORAL_TABLET | Freq: Every day | ORAL | 1 refills | Status: DC

## 2017-07-10 MED ORDER — ARMODAFINIL 150 MG PO TABS: 150.0000 mg | ORAL_TABLET | Freq: Every day | ORAL | 2 refills | Status: DC

## 2017-07-10 MED ORDER — ALPRAZOLAM 0.5 MG PO TABS: 0.5000 mg | ORAL_TABLET | Freq: Every day | ORAL | 0 refills | Status: DC | PRN

## 2017-07-10 NOTE — Progress Notes (Signed)
Name: Regina Christensen   MRN: [id number]    DOB: March 30, 1956   Date:07/10/2017       Progress Note  Subjective  Chief Complaint  Chief Complaint  Patient presents with  . Medication Refill    3 month F/U-Feeling Fatigue  . Hypertension    Edema in feet and ankles but decreased from what it used too.  . Depression  . Obesity    Doing well has losted 10 pounds since visit-Ozempic has decreased her snacking habits.  . Sleep Apnea    HPI  HTN: she was Valsartan320mg  and hydralazine, off HCTZ because of hypokalemia and it was causing facial irritation.She is onClonidine andAzor,we started her on Bystolic 01/2017  and she is doing well , no side effects of medication and bp has been at goal. No side effects of medication.   Morbid Obesity: weight has been stable, she was  on Victoza for pre-diabetes and weight loss, but it stopped working, we gave her Ozempic Oct 2018 but did not lose weight, since Dec 2018 she is on Ozempic 1 mg dialy and is doing well, lost 10 lbs.   Acanthosis nigricans/Metabolic syndrome: she denies polyphagia, polyuria or polydipsia, last hgbA1C was down from  6.0% to 5.8% she started on Victoza Fall 2017 and switched to [facility] Fall 2018 and is doing better on new medication  OSA: never able to tolerate CPAP, she has fatigue, day sleepiness, mental fogginess, shehas seen dentist and is wearing a mouth piece, she states the mouth piece has helped with symptoms, however still feels like mental fogginess is not completely clear. She is on Nuvigil but still feels tired, she also wakes up with headache at times. Explained that CPAP is the only modality that is really indicated for OSA  Depression Mild chronic: she is taking Effexor for many years, frustrated with her job, but worried that she cannot afford retiring, affects her mood. Discussed referral to psychiatrist or therapist, but she wants to hold off for now.    Patient Active Problem List   Diagnosis Date  Noted  . Bilateral carpal tunnel syndrome 07/30/2016  . OSA (obstructive sleep apnea) 03/07/2016  . Depression, major, recurrent, mild (HCC) 11/30/2015  . Osteoarthritis of both knees 11/30/2015  . BPPV (benign paroxysmal positional vertigo) 08/28/2015  . Essential hypertension 11/09/2014  . Hyperglycemia 11/09/2014  . GAD (generalized anxiety disorder) 11/09/2014  . Acanthosis nigricans 11/09/2014  . Morbid obesity (HCC) 01/06/2012    Past Surgical History:  Procedure Laterality Date  . ABDOMINAL HYSTERECTOMY    . BREAST EXCISIONAL BIOPSY Left   . ENDOMETRIAL ABLATION     x2  . GASTRIC BYPASS     sleeve    Family History  Problem Relation Age of Onset  . Heart disease Father   . Cancer Brother     Social History   Socioeconomic History  . Marital status: Divorced    Spouse name: Not on file  . Number of children: Not on file  . Years of education: Not on file  . Highest education level: Not on file  Occupational History  . Occupation: patient Tax adviser: [employer]  Social Needs  . Financial resource strain: Not on file  . Food insecurity:    Worry: Not on file    Inability: Not on file  . Transportation needs:    Medical: Not on file    Non-medical: Not on file  Tobacco Use  . Smoking status: Never Smoker  .  Smokeless tobacco: Never Used  Substance and Sexual Activity  . Alcohol use: No    Alcohol/week: 0.0 oz    Comment: rare  . Drug use: No  . Sexual activity: Never  Lifestyle  . Physical activity:    Days per week: Not on file    Minutes per session: Not on file  . Stress: Not on file  Relationships  . Social connections:    Talks on phone: Not on file    Gets together: Not on file    Attends religious service: Not on file    Active member of club or organization: Not on file    Attends meetings of clubs or organizations: Not on file    Relationship status: Not on file  . Intimate partner violence:    Fear of current or ex  partner: Not on file    Emotionally abused: Not on file    Physically abused: Not on file    Forced sexual activity: Not on file  Other Topics Concern  . 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  acetaminophen (TYLENOL 8 HOUR ARTHRITIS PAIN) 650 MG CR tablet, Take 1 tablet (650 mg total) by mouth every 8 (eight) hours as needed for pain. (Patient taking differently: Take 1,300 mg by mouth every 8 (eight) hours as needed for pain. ), Disp: 90 tablet, Rfl: 0 .  albuterol (PROVENTIL HFA;VENTOLIN HFA) 108 (90 Base) MCG/ACT inhaler, Inhale 2 puffs into the lungs every 6 (six) hours as needed for wheezing or shortness of breath., Disp: 1 Inhaler, Rfl: 2 .  ALPRAZolam (XANAX) 0.5 MG tablet, Take 1 tablet (0.5 mg total) by mouth daily as needed., Disp: 30 tablet, Rfl: 0 .  amLODipine-olmesartan (AZOR) 5-40 MG tablet, Take 1 tablet by mouth daily., Disp: 90 tablet, Rfl: 1 .  Armodafinil 150 MG tablet, Take 1 tablet (150 mg total) by mouth daily., Disp: 30 tablet, Rfl: 2 .  aspirin EC 81 MG tablet, Take 81 mg by mouth daily., Disp: , Rfl:  .  cloNIDine (CATAPRES - DOSED IN MG/24 HR) 0.1 mg/24hr patch, PLACE 1 PATCH (0.1 MG TOTAL) ONTO THE SKIN ONCE A WEEK., Disp: 12 patch, Rfl: 1 .  glucose blood test strip, Use as instructed, Disp: 100 each, Rfl: 12 .  meloxicam (MOBIC) 7.5 MG tablet, TAKE 1 TABLET BY MOUTH EVERY DAY (Patient taking differently: TAKE 1 TABLET BY MOUTH AS NEEDED), Disp: 30 tablet, Rfl: 0 .  nebivolol (BYSTOLIC) 5 MG tablet, Take 1 tablet (5 mg total) by mouth daily., Disp: 90 tablet, Rfl: 1 .  Semaglutide (OZEMPIC) 1 MG/DOSE SOPN, Inject 1 mg into the skin once a week., Disp: 9 mL, Rfl: 1 .  triamcinolone (NASACORT) 55 MCG/ACT AERO nasal inhaler, Place 2 sprays into the nose daily., Disp: , Rfl:  .  Triamcinolone  Acetonide (TRIAMCINOLONE 0.1 % CREAM : EUCERIN) CREA, Apply 1 application topically 2 (two) times daily., Disp: 1 each, Rfl: 0 .  venlafaxine XR (EFFEXOR-XR) 150 MG 24 hr capsule, TAKE 1 CAPSULE BY MOUTH EVERY DAY, Disp: 90 capsule, Rfl: 0 .  XIIDRA 5 % SOLN, Place 1 drop into both eyes 2 (two) times daily., Disp: , Rfl: 4 .  fexofenadine (ALLEGRA) 180 MG tablet, Take 180 mg by mouth daily.,  Disp: , Rfl:  .  LOTEMAX 0.5 % GEL, , Disp: , Rfl:  .  RESTASIS 0.05 % ophthalmic emulsion, INSTILL 1 DROP INTO BOTH EYES TWICE A DAY., Disp: , Rfl: 4  Allergies  Allergen Reactions  . Ace Inhibitors Cough  . Hctz [Hydrochlorothiazide] Rash    face     ROS  Constitutional: Negative for fever , positive for weight change.  Respiratory: Negative for cough and shortness of breath.   Cardiovascular: Negative for chest pain or palpitations.  Gastrointestinal: Negative for abdominal pain, no bowel changes.  Musculoskeletal: positive  for gait problem or joint swelling.  Skin: Negative for rash.  Neurological: Negative for dizziness , she has occasional  headache.  No other specific complaints in a complete review of systems (except as listed in HPI above).  Objective  Vitals:   07/10/17 1349  BP: 122/76  Pulse: 94  Resp: 16  Temp: 97.7 F (36.5 C)  TempSrc: Oral  SpO2: 94%  Weight: 280 lb (127 kg)  Height: 5\' 8"  (1.727 m)    Body mass index is 42.57 kg/m.  Physical Exam  Constitutional: Patient appears well-developed and well-nourished. Obese No distress.  HEENT: head atraumatic, normocephalic, pupils equal and reactive to light, neck supple, throat within normal limits Cardiovascular: Normal rate, regular rhythm and normal heart sounds.  No murmur heard. Trace BLE edema. Pulmonary/Chest: Effort normal and breath sounds normal. No respiratory distress. Abdominal: Soft.  There is no tenderness. Psychiatric: Patient has a normal mood and affect. behavior is normal. Judgment and thought  content normal.  PHQ2/9: [facility] 07/10/2017 11/07/2016 08/08/2016 06/20/2016 04/01/2016  Decreased Interest 0 0 0 0 0  Down, Depressed, Hopeless 0 0 0 0 0  PHQ - 2 Score 0 0 0 0 0  Altered sleeping 3 - - - -  Tired, decreased energy 1 - - - -  Change in appetite 0 - - - -  Feeling bad or failure about yourself  2 - - - -  Trouble concentrating 2 - - - -  Moving slowly or fidgety/restless 1 - - - -  Suicidal thoughts 0 - - - -  PHQ-9 Score 9 - - - -  Difficult doing work/chores Somewhat difficult - - - -     Fall Risk: Fall Risk  07/10/2017 03/27/2017 02/13/2017 11/07/2016 08/08/2016  Falls in the past year? No No No No No  Number falls in past yr: - - - - -  Injury with Fall? - - - - -     Functional Status Survey: Is the patient deaf or have difficulty hearing?: No Does the patient have difficulty seeing, even when wearing glasses/contacts?: No Does the patient have difficulty concentrating, remembering, or making decisions?: Yes(concentrating) Does the patient have difficulty walking or climbing stairs?: Yes(when her right knee is bothering her) Does the patient have difficulty dressing or bathing?: No Does the patient have difficulty doing errands alone such as visiting a doctor's office or shopping?: No   Assessment & Plan  1. Depression, major, recurrent, mild (HCC)  - venlafaxine XR (EFFEXOR-XR) 150 MG 24 hr capsule; TAKE 1 CAPSULE BY MOUTH EVERY DAY  Dispense: 90 capsule; Refill: 0 She states she states problems are at work   2. OSA (obstructive sleep apnea)  - Armodafinil 150 MG tablet; Take 1 tablet (150 mg total) by mouth daily.  Dispense: 30 tablet; Refill: 2  3. Metabolic syndrome  - Semaglutide (OZEMPIC) 1 MG/DOSE SOPN; Inject 1 mg  into the skin once a week.  Dispense: 9 mL; Refill: 1  4. Morbid obesity, unspecified obesity type [facility])  Discussed with the patient the risk posed by an increased BMI. Discussed importance of portion control,  calorie counting and at least 150 minutes of physical activity weekly. Avoid sweet beverages and drink more water. Eat at least 6 servings of fruit and vegetables daily   5. Essential hypertension  - nebivolol (BYSTOLIC) 5 MG tablet; Take 1 tablet (5 mg total) by mouth daily.  Dispense: 90 tablet; Refill: 1  6. Prediabetes   7. GAD (generalized anxiety disorder)  - venlafaxine XR (EFFEXOR-XR) 150 MG 24 hr capsule; TAKE 1 CAPSULE BY MOUTH EVERY DAY  Dispense: 90 capsule; Refill: 0 - ALPRAZolam (XANAX) 0.5 MG tablet; Take 1 tablet (0.5 mg total) by mouth daily as needed.  Dispense: 30 tablet; Refill: 0

## 2017-07-10 NOTE — Telephone Encounter (Signed)
Refill request for general medication: Albuterol (Proventil)  Last office visit: 07/10/2017  Last physical exam: None indicated   follow-ups on file. 10/30/2017

## 2017-08-12 ENCOUNTER — Other Ambulatory Visit: Payer: Self-pay | Admitting: Family Medicine

## 2017-08-12 NOTE — Telephone Encounter (Signed)
Refill request for general medication. Albuterol to [facility]  Last office visit: 07/10/2017   Follow up on 10/30/2017

## 2017-08-25 ENCOUNTER — Other Ambulatory Visit: Payer: Self-pay | Admitting: Nurse Practitioner

## 2017-08-25 ENCOUNTER — Telehealth: Payer: Self-pay | Admitting: Emergency Medicine

## 2017-08-25 DIAGNOSIS — R739 Hyperglycemia, unspecified: Secondary | ICD-10-CM

## 2017-08-25 MED ORDER — FREESTYLE LANCETS MISC: 12 refills | Status: DC

## 2017-08-25 NOTE — Telephone Encounter (Signed)
Need lancets called in to check BS

## 2017-08-27 NOTE — Telephone Encounter (Signed)
Thank you :)

## 2017-09-22 ENCOUNTER — Ambulatory Visit: Payer: BC Managed Care – PPO | Admitting: Nurse Practitioner

## 2017-09-22 ENCOUNTER — Encounter: Payer: Self-pay | Admitting: Nurse Practitioner

## 2017-09-22 VITALS — BP 140/80 | HR 95 | Resp 16 | Ht 68.0 in | Wt 282.3 lb

## 2017-09-22 DIAGNOSIS — Z6841 Body Mass Index (BMI) 40.0 and over, adult: Secondary | ICD-10-CM

## 2017-09-22 DIAGNOSIS — R234 Changes in skin texture: Secondary | ICD-10-CM

## 2017-09-22 NOTE — Progress Notes (Addendum)
Name: Regina Christensen   MRN: [id number]    DOB: 05/28/1955   Date:09/22/2017       Progress Note  Subjective  Chief Complaint  Chief Complaint  Patient presents with  . Breast Problem    she would like to have a referral for a diagnostic mammogram because she has a spot on her right breast that she has some concerns about. Her yearly mammogram isn't due until 11/01/2018.    HPI  Patient noted at the end of April had sharp pain in right breast lasting for several seconds, then self-resolved. Denies injuries. Patient noticed bruised spot on right breast- first dark blue then changes to black- area was a little sore. Denies lump or knot on the area. States didn't think anything of it initially but has been wondering if it is breast cancer since she has had more free time to think about it.   Denies swelling or redness, nipple discharge, itching or irritation, no skin peeling or flaking.  Denies personal or family history of breast cancer. Denies alcohol, smoking, estrogen replacement (used for one week after surgery when had night sweats). Has had 2 pregnancies and breast fed some with children.   Patient Active Problem List   Diagnosis Date Noted  . Bilateral carpal tunnel syndrome 07/30/2016  . OSA (obstructive sleep apnea) 03/07/2016  . Depression, major, recurrent, mild (HCC) 11/30/2015  . Osteoarthritis of both knees 11/30/2015  . BPPV (benign paroxysmal positional vertigo) 08/28/2015  . Essential hypertension 11/09/2014  . Hyperglycemia 11/09/2014  . GAD (generalized anxiety disorder) 11/09/2014  . Acanthosis nigricans 11/09/2014  . Morbid obesity (HCC) 01/06/2012    Past Medical History:  Diagnosis Date  . Arthritis   . Depression   . Hypertension   . Morbid obesity with BMI of 45.0-49.9, adult (HCC)   . Obesity   . Sleep apnea     Past Surgical History:  Procedure Laterality Date  . ABDOMINAL HYSTERECTOMY    . BREAST EXCISIONAL BIOPSY Left   . ENDOMETRIAL ABLATION      x2  . GASTRIC BYPASS     sleeve    Social History   Tobacco Use  . Smoking status: Never Smoker  . Smokeless tobacco: Never Used  Substance Use Topics  . Alcohol use: No    Alcohol/week: 0.0 oz    Comment: rare     Current Outpatient Medications:  .  acetaminophen (TYLENOL 8 HOUR ARTHRITIS PAIN) 650 MG CR tablet, Take 1 tablet (650 mg total) by mouth every 8 (eight) hours as needed for pain. (Patient taking differently: Take 1,300 mg by mouth every 8 (eight) hours as needed for pain. ), Disp: 90 tablet, Rfl: 0 .  albuterol (PROVENTIL HFA;VENTOLIN HFA) 108 (90 Base) MCG/ACT inhaler, TAKE 2 PUFFS BY MOUTH EVERY 6 HOURS AS NEEDED FOR WHEEZE OR SHORTNESS OF BREATH, Disp: 8.5 Inhaler, Rfl: 0 .  ALPRAZolam (XANAX) 0.5 MG tablet, Take 1 tablet (0.5 mg total) by mouth daily as needed., Disp: 30 tablet, Rfl: 0 .  amLODipine-olmesartan (AZOR) 5-40 MG tablet, Take 1 tablet by mouth daily., Disp: 90 tablet, Rfl: 1 .  Armodafinil 150 MG tablet, Take 1 tablet (150 mg total) by mouth daily., Disp: 30 tablet, Rfl: 2 .  aspirin EC 81 MG tablet, Take 81 mg by mouth daily., Disp: , Rfl:  .  cloNIDine (CATAPRES - DOSED IN MG/24 HR) 0.1 mg/24hr patch, PLACE 1 PATCH (0.1 MG TOTAL) ONTO THE SKIN ONCE A WEEK., Disp: 12 patch, Rfl:  1 .  fexofenadine (ALLEGRA) 180 MG tablet, Take 180 mg by mouth daily., Disp: , Rfl:  .  glucose blood test strip, Use as instructed, Disp: 100 each, Rfl: 12 .  Lancets (FREESTYLE) lancets, Use as instructed, Disp: 100 each, Rfl: 12 .  LOTEMAX 0.5 % GEL, , Disp: , Rfl:  .  meloxicam (MOBIC) 7.5 MG tablet, TAKE 1 TABLET BY MOUTH EVERY DAY (Patient taking differently: TAKE 1 TABLET BY MOUTH AS NEEDED), Disp: 30 tablet, Rfl: 0 .  nebivolol (BYSTOLIC) 5 MG tablet, Take 1 tablet (5 mg total) by mouth daily., Disp: 90 tablet, Rfl: 1 .  RESTASIS 0.05 % ophthalmic emulsion, INSTILL 1 DROP INTO BOTH EYES TWICE A DAY., Disp: , Rfl: 4 .  Semaglutide (OZEMPIC) 1 MG/DOSE SOPN, Inject 1 mg  into the skin once a week., Disp: 9 mL, Rfl: 1 .  triamcinolone (NASACORT) 55 MCG/ACT AERO nasal inhaler, Place 2 sprays into the nose daily., Disp: , Rfl:  .  Triamcinolone Acetonide (TRIAMCINOLONE 0.1 % CREAM : EUCERIN) CREA, Apply 1 application topically 2 (two) times daily., Disp: 1 each, Rfl: 0 .  venlafaxine XR (EFFEXOR-XR) 150 MG 24 hr capsule, TAKE 1 CAPSULE BY MOUTH EVERY DAY, Disp: 90 capsule, Rfl: 0 .  XIIDRA 5 % SOLN, Place 1 drop into both eyes 2 (two) times daily., Disp: , Rfl: 4  Allergies  Allergen Reactions  . Ace Inhibitors Cough  . Hctz [Hydrochlorothiazide] Rash    face    ROS  Constitutional: Negative for fever or weight change.  Respiratory: Negative for cough and shortness of breath.   Cardiovascular: Negative for chest pain or palpitations.  Gastrointestinal: Negative for abdominal pain, no bowel changes.  Musculoskeletal: Negative for gait problem or joint swelling.  Skin: Negative for rash.  Neurological: Negative for dizziness or headache.  No other specific complaints in a complete review of systems (except as listed in HPI above).  Objective  Vitals:   09/22/17 1351  BP: 140/80  Pulse: 95  Resp: 16  SpO2: 96%  Weight: 282 lb 4.8 oz (128.1 kg)  Height: 5\' 8"  (1.727 m)    Body mass index is 42.92 kg/m.  Nursing Note and Vital Signs reviewed.  Physical Exam  Skin:        Constitutional: Patient appears well-developed and well-nourished. Obese  No distress.  Breast: bilateral breast exam completed, no redness, dimpling, nipple discharge, multiple small mobile, non-tender areas palpated.  Cardiovascular: Normal rate, regular rhythm, S1/S2 present.   Pulmonary/Chest: Effort normal and breath sounds clear. No respiratory distress or retractions. Psychiatric: Patient has a normal mood and affect. behavior is normal. Judgment and thought content normal.  No results found for this or any previous visit (from the past 72 hour(s)).  Assessment  & Plan  1. Breast skin changes  - MM DIAG BREAST TOMO UNI RIGHT; Future - US BREAST LTD UNI RIGHT INC AXILLA; Future  2. Morbid obesity (HCC) - increase water, and exercise, discussed diet  3. BMI 40.0-44.9, adult [facility]) See above    Follow up and care instructions discussed and provided in AVS.   --------------------------------- I have reviewed this encounter including the documentation in this note and/or discussed this patient with the provider, Sharyon Cable DNP AGNP-C. I am certifying that I agree with the content of this note as supervising physician. Baruch Gouty, MD [facility] 10/06/2017, 5:54 PM

## 2017-09-22 NOTE — Patient Instructions (Addendum)
Right breast diagnostic mammogram ordered.    - Drink more water - Move more at work   Recommendation: importance of 150 minutes of physical activity weekly, eat two servings of fish weekly, eat one serving of tree nuts ( cashews, pistachios, pecans, almonds.Marland Kitchen.) every other day, eat 6 servings of fruit/vegetables daily and drink plenty of water (goal is 64 ounces a day) and avoid sweet beverages.

## 2017-09-22 NOTE — Addendum Note (Signed)
Addended by: Cheryle HorsfallPOULOSE, ELIZABETH E on: 09/22/2017 03:04 PM   Modules accepted: Orders

## 2017-10-09 ENCOUNTER — Ambulatory Visit
Admission: RE | Admit: 2017-10-09 | Discharge: 2017-10-09 | Disposition: A | Discharge status: Home / Self Care | Payer: BC Managed Care – PPO | Source: Ambulatory Visit | Attending: Nurse Practitioner | Admitting: Nurse Practitioner

## 2017-10-09 DIAGNOSIS — R234 Changes in skin texture: Secondary | ICD-10-CM

## 2017-10-30 ENCOUNTER — Other Ambulatory Visit: Payer: Self-pay | Admitting: Family Medicine

## 2017-10-30 ENCOUNTER — Ambulatory Visit: Payer: BC Managed Care – PPO | Admitting: Family Medicine

## 2017-10-30 ENCOUNTER — Encounter: Payer: Self-pay | Admitting: Family Medicine

## 2017-10-30 VITALS — BP 136/84 | HR 85 | Temp 98.2°F | Resp 14 | Ht 68.0 in | Wt 282.3 lb

## 2017-10-30 DIAGNOSIS — I10 Essential (primary) hypertension: Secondary | ICD-10-CM

## 2017-10-30 DIAGNOSIS — F330 Major depressive disorder, recurrent, mild: Secondary | ICD-10-CM

## 2017-10-30 DIAGNOSIS — G4733 Obstructive sleep apnea (adult) (pediatric): Secondary | ICD-10-CM

## 2017-10-30 DIAGNOSIS — G44219 Episodic tension-type headache, not intractable: Secondary | ICD-10-CM

## 2017-10-30 DIAGNOSIS — F411 Generalized anxiety disorder: Secondary | ICD-10-CM | POA: Diagnosis not present

## 2017-10-30 DIAGNOSIS — E8881 Metabolic syndrome: Secondary | ICD-10-CM

## 2017-10-30 DIAGNOSIS — Z1231 Encounter for screening mammogram for malignant neoplasm of breast: Secondary | ICD-10-CM

## 2017-10-30 MED ORDER — NEBIVOLOL HCL 5 MG PO TABS: 5.0000 mg | ORAL_TABLET | Freq: Every day | ORAL | 1 refills | Status: DC

## 2017-10-30 MED ORDER — SEMAGLUTIDE (1 MG/DOSE) 2 MG/1.5ML ~~LOC~~ SOPN: 1.0000 mg | PEN_INJECTOR | SUBCUTANEOUS | 1 refills | Status: DC

## 2017-10-30 MED ORDER — CLONIDINE 0.1 MG/24HR TD PTWK: MEDICATED_PATCH | TRANSDERMAL | 1 refills | Status: DC

## 2017-10-30 MED ORDER — AMLODIPINE-OLMESARTAN 5-40 MG PO TABS: 1.0000 | ORAL_TABLET | Freq: Every day | ORAL | 1 refills | Status: DC

## 2017-10-30 MED ORDER — ARMODAFINIL 150 MG PO TABS: 150.0000 mg | ORAL_TABLET | Freq: Every day | ORAL | 2 refills | Status: DC

## 2017-10-30 MED ORDER — VENLAFAXINE HCL ER 150 MG PO CP24: ORAL_CAPSULE | ORAL | 1 refills | Status: DC

## 2017-10-30 NOTE — Progress Notes (Addendum)
Name: Regina Christensen   MRN: [id number]    DOB: 12-02-1955   Date:10/30/2017       Progress Note  Subjective  Chief Complaint  Chief Complaint  Patient presents with  . Medication Refill    3 month F/U  . Depression  . Hypertension    Edema in feet-left is worst than the other, also complaints about left side headaches for the past couple of days  . Sleep Apnea  . Morbid Obesity  . 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   Morbid Obesity: weight has been stable, she was  on Victoza for pre-diabetes and weight loss, but it stopped working, we gave her Ozempic Oct 2018 but did not lose weight, since Dec 2018 she is on Ozempic 1 mg daily, she lost 10 lbs but gained 2 lbs back in the past 3 months. She states she has not been very compliant with her diet over the past 2 months.   Acanthosis nigricans/Metabolic syndrome: she denies polyphagia, polyuria or polydipsia, last hgbA1C wasdown from 6.0% to 5.8% she started on Victoza Fall 2017 and switched to [facility] Fall 2018 and tolerating medication well.   OSA: never able to tolerate CPAP, she has fatigue, day sleepiness, mental fogginess, shehas seen dentist and is wearing a mouth piece, she states the mouth piece has helped with symptoms, however still feels like mental fogginess is not completely clear.She is on Nuvigil but still feels tired, she also wakes up with headache at times. Explained that CPAP is the only modality that is really indicated for OSA . She states she went to the dentist and has a mouth piece now.   Depression Mild chronic: she is taking Effexor for many years, phq 9 is stable, she is happy with new granddaughter.   Headache: she states that she has a history of  trigeminal neuralgia in the past, but this time is different. She noticed dull pain behind left eye and on top of her left side of head. Denies phonophobia, photophobia, nausea or vomiting. She has been taking allergy medication. She has not tried Tylenol yet. Pain is mild 5/10 and constant for the past few days.   Patient Active Problem List   Diagnosis Date Noted  . Bilateral carpal tunnel syndrome 07/30/2016  . OSA (obstructive sleep apnea) 03/07/2016  . Depression, major, recurrent, mild (HCC) 11/30/2015  . Osteoarthritis of both knees 11/30/2015  . BPPV (benign paroxysmal positional vertigo) 08/28/2015  . Essential hypertension 11/09/2014  . Hyperglycemia 11/09/2014  . GAD (generalized anxiety disorder) 11/09/2014  . Acanthosis nigricans 11/09/2014  . Morbid obesity (HCC) 01/06/2012    Past Surgical History:  Procedure Laterality Date  . ABDOMINAL HYSTERECTOMY    . BREAST EXCISIONAL BIOPSY Left   . ENDOMETRIAL ABLATION     x2  . GASTRIC BYPASS     sleeve    Family History  Problem Relation Age of Onset  . Heart disease Father   . Cancer Brother     Social History   Socioeconomic History  . Marital status: Divorced    Spouse name: Not on file  . Number of children: Not on file  . Years of education: Not on file  . Highest  education level: Not on file  Occupational History  . Occupation: patient Tax adviser: [employer]  Social Needs  . Financial resource strain: Not on file  . Food insecurity:    Worry: Not on file    Inability: Not on file  . Transportation needs:    Medical: Not on file    Non-medical: Not on file  Tobacco Use  . Smoking status: Never Smoker  . Smokeless tobacco: Never Used  Substance and Sexual Activity  . Alcohol use: No    Alcohol/week: 0.0 oz    Comment: rare  . Drug use: No  . Sexual activity: Never  Lifestyle  . Physical activity:    Days per week: Not on file    Minutes per session: Not on file  . Stress: Not on  file  Relationships  . Social connections:    Talks on phone: Not on file    Gets together: Not on file    Attends religious service: Not on file    Active member of club or organization: Not on file    Attends meetings of clubs or organizations: Not on file    Relationship status: Not on file  . Intimate partner violence:    Fear of current or ex partner: Not on file    Emotionally abused: Not on file    Physically abused: Not on file    Forced sexual activity: Not on file  Other Topics Concern  . 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  acetaminophen (TYLENOL 8 HOUR ARTHRITIS PAIN) 650 MG CR tablet, Take 1 tablet (650 mg total) by mouth every 8 (eight) hours as needed for pain. (Patient taking differently: Take 1,300 mg by mouth every 8 (eight) hours as needed for pain. ), Disp: 90 tablet, Rfl: 0 .  albuterol (PROVENTIL HFA;VENTOLIN HFA) 108 (90 Base) MCG/ACT inhaler, TAKE 2 PUFFS BY MOUTH EVERY 6 HOURS AS NEEDED FOR WHEEZE OR SHORTNESS OF BREATH, Disp: 8.5 Inhaler, Rfl: 0 .  ALPRAZolam (XANAX) 0.5 MG tablet, Take 1 tablet (0.5 mg total) by mouth daily as needed., Disp: 30 tablet, Rfl: 0 .  amLODipine-olmesartan (AZOR) 5-40 MG tablet, Take 1 tablet by mouth daily., Disp: 90 tablet, Rfl: 1 .  Armodafinil 150 MG tablet, Take 1 tablet (150 mg total) by mouth daily., Disp: 30 tablet, Rfl: 2 .  aspirin EC 81 MG tablet, Take 81 mg by mouth daily., Disp: , Rfl:  .  cloNIDine (CATAPRES - DOSED IN MG/24 HR) 0.1 mg/24hr patch, PLACE 1 PATCH (0.1 MG TOTAL) ONTO THE SKIN ONCE A WEEK., Disp: 12 patch, Rfl: 1 .  fexofenadine (ALLEGRA) 180 MG tablet, Take 180 mg by mouth daily., Disp: , Rfl:  .  glucose blood test strip, Use as instructed, Disp: 100 each, Rfl: 12 .  Lancets (FREESTYLE) lancets, Use as instructed, Disp: 100 each,  Rfl: 12 .  meloxicam (MOBIC) 7.5 MG tablet, TAKE 1 TABLET BY MOUTH EVERY DAY (Patient taking differently: TAKE 1 TABLET BY MOUTH AS NEEDED), Disp: 30 tablet, Rfl: 0 .  Semaglutide (OZEMPIC) 1 MG/DOSE SOPN, Inject 1 mg into the skin once a week., Disp: 9 mL, Rfl: 1 .  triamcinolone (NASACORT) 55 MCG/ACT AERO nasal inhaler, Place 2 sprays into the nose  daily., Disp: , Rfl:  .  Triamcinolone Acetonide (TRIAMCINOLONE 0.1 % CREAM : EUCERIN) CREA, Apply 1 application topically 2 (two) times daily., Disp: 1 each, Rfl: 0 .  venlafaxine XR (EFFEXOR-XR) 150 MG 24 hr capsule, TAKE 1 CAPSULE BY MOUTH EVERY DAY, Disp: 90 capsule, Rfl: 1 .  XIIDRA 5 % SOLN, Place 1 drop into both eyes 2 (two) times daily., Disp: , Rfl: 4 .  LOTEMAX 0.5 % GEL, , Disp: , Rfl:  .  nebivolol (BYSTOLIC) 5 MG tablet, Take 1 tablet (5 mg total) by mouth daily., Disp: 90 tablet, Rfl: 1 .  RESTASIS 0.05 % ophthalmic emulsion, INSTILL 1 DROP INTO BOTH EYES TWICE A DAY., Disp: , Rfl: 4  Allergies  Allergen Reactions  . Ace Inhibitors Cough  . Hctz [Hydrochlorothiazide] Rash    face     ROS  Constitutional: Negative for fever or weight change.  Respiratory: Negative for cough and shortness of breath.   Cardiovascular: Negative for chest pain or palpitations.  Gastrointestinal: Negative for abdominal pain, no bowel changes.  Musculoskeletal: Negative for gait problem or joint swelling.  Skin: Negative for rash.  Neurological: Negative for dizziness, positive for  headache.  No other specific complaints in a complete review of systems (except as listed in HPI above).  Objective  Vitals:   10/30/17 1332  BP: 136/84  Pulse: 85  Resp: 14  Temp: 98.2 F (36.8 C)  TempSrc: Oral  SpO2: 98%  Weight: 282 lb 4.8 oz (128.1 kg)  Height: 5\' 8"  (1.727 m)    Body mass index is 42.92 kg/m.  Physical Exam  Constitutional: Patient appears well-developed and well-nourished. Obese  No distress.  HEENT: head atraumatic,  normocephalic, pupils equal and reactive to light,  neck supple, throat within normal limits Cardiovascular: Normal rate, regular rhythm and normal heart sounds.  No murmur heard. Trace  BLE edema. Pulmonary/Chest: Effort normal and breath sounds normal. No respiratory distress. Abdominal: Soft.  There is no tenderness. Psychiatric: Patient has a normal mood and affect. behavior is normal. Judgment and thought content normal. Neurological: normal cranial nerves, romberg negative, no pain during palpation of temporal area.   PHQ2/9: Depression screen [facility] 2/9 10/30/2017 09/22/2017 07/10/2017 11/07/2016 08/08/2016  Decreased Interest 0 1 0 0 0  Down, Depressed, Hopeless 1 1 0 0 0  PHQ - 2 Score 1 2 0 0 0  Altered sleeping 2 2 3  - -  Tired, decreased energy 1 3 1  - -  Change in appetite 1 2 0 - -  Feeling bad or failure about yourself  1 0 2 - -  Trouble concentrating 1 0 2 - -  Moving slowly or fidgety/restless 0 0 1 - -  Suicidal thoughts 0 0 0 - -  PHQ-9 Score 7 9 9  - -  Difficult doing work/chores Not difficult at all Not difficult at all Somewhat difficult - -     Fall Risk: Fall Risk  10/30/2017 09/22/2017 07/10/2017 03/27/2017 02/13/2017  Falls in the past year? No No No No No  Number falls in past yr: - - - - -  Injury with Fall? - - - - -     Functional Status Survey: Is the patient deaf or have difficulty hearing?: No Does the patient have difficulty seeing, even when wearing glasses/contacts?: No Does the patient have difficulty concentrating, remembering, or making decisions?: No Does the patient have difficulty walking or climbing stairs?: No Does the patient have difficulty dressing or bathing?: No  Does the patient have difficulty doing errands alone such as visiting a doctor's office or shopping?: No    Assessment & Plan  1. Essential hypertension  - nebivolol (BYSTOLIC) 5 MG tablet; Take 1 tablet (5 mg total) by mouth daily.  Dispense: 90 tablet; Refill: 1 - cloNIDine  (CATAPRES - DOSED IN MG/24 HR) 0.1 mg/24hr patch; PLACE 1 PATCH (0.1 MG TOTAL) ONTO THE SKIN ONCE A WEEK.  Dispense: 12 patch; Refill: 1 - amLODipine-olmesartan (AZOR) 5-40 MG tablet; Take 1 tablet by mouth daily.  Dispense: 90 tablet; Refill: 1  2. Metabolic syndrome  - Semaglutide (OZEMPIC) 1 MG/DOSE SOPN; Inject 1 mg into the skin once a week.  Dispense: 9 mL; Refill: 1  3. Depression, major, recurrent, mild (HCC)  - venlafaxine XR (EFFEXOR-XR) 150 MG 24 hr capsule; TAKE 1 CAPSULE BY MOUTH EVERY DAY  Dispense: 90 capsule; Refill: 1  4. GAD (generalized anxiety disorder)  - venlafaxine XR (EFFEXOR-XR) 150 MG 24 hr capsule; TAKE 1 CAPSULE BY MOUTH EVERY DAY  Dispense: 90 capsule; Refill: 1  5. OSA (obstructive sleep apnea)  - Armodafinil 150 MG tablet; Take 1 tablet (150 mg total) by mouth daily.  Dispense: 30 tablet; Refill: 2   6. Episodic tension-type headache, not intractable  She cannot tolerate Excedrin, advised to try Tylenol, cold compresses and saline nasal spray for now .

## 2017-11-20 ENCOUNTER — Ambulatory Visit: Payer: BC Managed Care – PPO

## 2017-12-11 ENCOUNTER — Ambulatory Visit
Admission: RE | Admit: 2017-12-11 | Discharge: 2017-12-11 | Disposition: A | Discharge status: Home / Self Care | Payer: BC Managed Care – PPO | Source: Ambulatory Visit | Attending: Family Medicine | Admitting: Family Medicine

## 2017-12-11 DIAGNOSIS — Z1231 Encounter for screening mammogram for malignant neoplasm of breast: Secondary | ICD-10-CM

## 2018-02-04 ENCOUNTER — Encounter: Payer: Self-pay | Admitting: Family Medicine

## 2018-02-05 ENCOUNTER — Ambulatory Visit: Payer: BC Managed Care – PPO | Admitting: Family Medicine

## 2018-02-05 ENCOUNTER — Encounter: Payer: Self-pay | Admitting: Family Medicine

## 2018-02-05 VITALS — BP 136/82 | HR 94 | Temp 98.1°F | Resp 16 | Ht 68.0 in | Wt 283.4 lb

## 2018-02-05 DIAGNOSIS — G4733 Obstructive sleep apnea (adult) (pediatric): Secondary | ICD-10-CM

## 2018-02-05 DIAGNOSIS — Z23 Encounter for immunization: Secondary | ICD-10-CM

## 2018-02-05 DIAGNOSIS — M170 Bilateral primary osteoarthritis of knee: Secondary | ICD-10-CM

## 2018-02-05 DIAGNOSIS — I10 Essential (primary) hypertension: Secondary | ICD-10-CM

## 2018-02-05 DIAGNOSIS — Z6841 Body Mass Index (BMI) 40.0 and over, adult: Secondary | ICD-10-CM

## 2018-02-05 DIAGNOSIS — F325 Major depressive disorder, single episode, in full remission: Secondary | ICD-10-CM

## 2018-02-05 DIAGNOSIS — E8881 Metabolic syndrome: Secondary | ICD-10-CM

## 2018-02-05 MED ORDER — LORCASERIN HCL ER 20 MG PO TB24: 1.0000 | ORAL_TABLET | Freq: Every day | ORAL | 2 refills | Status: DC

## 2018-02-05 NOTE — Progress Notes (Signed)
Name: Regina Christensen   MRN: [id number]    DOB: Sep 19, 1955   Date:02/05/2018       Progress Note  Subjective  Chief Complaint  Chief Complaint  Patient presents with  . Follow-up    3 mth f/u  . Depression  . Hypertension  . Sleep Apnea  . Obesity  . Allergic Rhinitis   . 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 She has lower extremity edema and is wearing compression stocking hoses, she took lasix in the past and asked for a refill but explained not as safe as compression stocking hoses  Morbid Obesity: she had bariatric surgery about 5 years ago, original weight prior to surgery was around 313 lbs, she lost 50 lbs but gradually gained it back.  Shewason Victoza for pre-diabetes and weight loss, but it stopped working, we gave herOzempicOct 2018 but did not lose weight, since Dec 2018 she is on Ozempic 1 mg daily, she lost 10 lbs but is gradually gaining her weight back. She packs her lunch, eating salads and tuna, fruit, but continues to gain weight, she would like help with her weight loss.  She would like to try Belviq   Acanthosis nigricans/Metabolic syndrome: she denies polyphagia, polyuria or polydipsia, last hgbA1C wasdown from 6.0% to 5.8% she started on Victoza Fall 2017and switched to [facility] Fall 2018 and tolerating medication well. She is due for repeat labs.   OSA: never able to tolerate CPAP, she has fatigue, day sleepiness, mental fogginess, shehas seen dentist and is wearing a mouth piece, she states the mouth piece but recent note from dentist states did not reverse symptoms and she needs to have a repeat sleep study.  She is on Nuvigil but still feels tired, she also wakes up with headache at times.   Depression in remission.   she is taking Effexor for many years, phq 9 was zero today , she is happy with new granddaughter.   OA: taking Meloxicam prn and also tylenol daily, she states pain is under control   Patient Active Problem List   Diagnosis Date Noted  . Bilateral carpal tunnel syndrome 07/30/2016  . OSA (obstructive sleep apnea) 03/07/2016  . Depression, major, recurrent, mild (HCC) 11/30/2015  . Osteoarthritis of both knees 11/30/2015  . BPPV (benign paroxysmal positional vertigo) 08/28/2015  . Essential hypertension 11/09/2014  . Hyperglycemia 11/09/2014  . GAD (generalized anxiety disorder) 11/09/2014  . Acanthosis nigricans 11/09/2014  . Morbid obesity (HCC) 01/06/2012    Past Surgical History:  Procedure Laterality Date  . ABDOMINAL HYSTERECTOMY    . BREAST EXCISIONAL BIOPSY Left   . ENDOMETRIAL ABLATION     x2  . GASTRIC BYPASS     sleeve    Family History  Problem Relation Age of Onset  . Heart disease Father   . Cancer Brother     Social History   Socioeconomic History  . Marital status: Divorced    Spouse name: Not on file  . Number of children: 2  . Years of education: Not on file  . Highest education level: High school graduate  Occupational History  . Occupation: patient Tax adviser: [employer]  Social Needs  . Financial resource strain: Not hard at all  .  Food insecurity:    Worry: Never true    Inability: Never true  . Transportation needs:    Medical: No    Non-medical: No  Tobacco Use  . Smoking status: Never Smoker  . Smokeless tobacco: Never Used  Substance and Sexual Activity  . Alcohol use: No    Alcohol/week: 0.0 standard drinks    Comment: rare  . Drug use: No  . Sexual activity: Never  Lifestyle  . Physical activity:    Days per week: 0 days    Minutes per session: 0 min  . Stress: Rather much  Relationships  . Social connections:    Talks on phone: More than three times a week    Gets together: More than three times a week     Attends religious service: More than 4 times per year    Active member of club or organization: No    Attends meetings of clubs or organizations: Never    Relationship status: Divorced  . Intimate partner violence:    Fear of current or ex partner: No    Emotionally abused: No    Physically abused: No    Forced sexual activity: No  Other Topics Concern  . 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  acetaminophen (TYLENOL 8 HOUR ARTHRITIS PAIN) 650 MG CR tablet, Take 1 tablet (650 mg total) by mouth every 8 (eight) hours as needed for pain. (Patient taking differently: Take 1,300 mg by mouth every 8 (eight) hours as needed for pain. ), Disp: 90 tablet, Rfl: 0 .  albuterol (PROVENTIL HFA;VENTOLIN HFA) 108 (90 Base) MCG/ACT inhaler, TAKE 2 PUFFS BY MOUTH EVERY 6 HOURS AS NEEDED FOR WHEEZE OR SHORTNESS OF BREATH, Disp: 8.5 Inhaler, Rfl: 0 .  ALPRAZolam (XANAX) 0.5 MG tablet, Take 1 tablet (0.5 mg total) by mouth daily as needed., Disp: 30 tablet, Rfl: 0 .  amLODipine-olmesartan (AZOR) 5-40 MG tablet, Take 1 tablet by mouth daily., Disp: 90 tablet, Rfl: 1 .  Armodafinil 150 MG tablet, Take 1 tablet (150 mg total) by mouth daily., Disp: 30 tablet, Rfl: 2 .  aspirin EC 81 MG tablet, Take 81 mg by mouth daily., Disp: , Rfl:  .  cloNIDine (CATAPRES - DOSED IN MG/24 HR) 0.1 mg/24hr patch, PLACE 1 PATCH (0.1 MG TOTAL) ONTO THE SKIN ONCE A WEEK., Disp: 12 patch, Rfl: 1 .  fexofenadine (ALLEGRA) 180 MG tablet, Take 180 mg by mouth daily., Disp: , Rfl:  .  meloxicam (MOBIC) 7.5 MG tablet, TAKE 1 TABLET BY MOUTH EVERY DAY (Patient taking differently: TAKE 1 TABLET BY MOUTH AS NEEDED), Disp: 30 tablet, Rfl: 0 .  nebivolol (BYSTOLIC) 5 MG tablet, Take 1 tablet (5 mg total) by mouth daily., Disp: 90 tablet, Rfl: 1 .  Semaglutide (OZEMPIC) 1  MG/DOSE SOPN, Inject 1 mg into the skin once a week., Disp: 9 mL, Rfl: 1 .  triamcinolone (NASACORT) 55 MCG/ACT AERO nasal inhaler, Place 2 sprays into the nose daily., Disp: , Rfl:  .  Triamcinolone Acetonide (TRIAMCINOLONE 0.1 % CREAM : EUCERIN) CREA, Apply 1 application topically 2 (two) times daily., Disp: 1 each, Rfl: 0 .  venlafaxine XR (EFFEXOR-XR) 150 MG 24 hr capsule, TAKE 1 CAPSULE BY MOUTH EVERY DAY, Disp: 90 capsule, Rfl: 1 .  XIIDRA 5 % SOLN, Place 1 drop into both eyes 2 (two) times daily., Disp: , Rfl: 4  Allergies  Allergen Reactions  . Ace Inhibitors Cough  . Hctz [Hydrochlorothiazide] Rash    face    I personally reviewed active problem list, medication list, allergies, family history, social history, health maintenance with the patient/caregiver today.   ROS  Constitutional: Negative for fever or weight change.  Respiratory: Negative for cough and shortness of breath.   Cardiovascular: Negative for chest pain or palpitations.  Gastrointestinal: Negative for abdominal pain, no bowel changes.  Musculoskeletal: Negative for gait problem or joint swelling.  Skin: Negative for rash.  Neurological: Negative for dizziness or headache.  No other specific complaints in a complete review of systems (except as listed in HPI above).  Objective  Vitals:   02/05/18 1438  BP: 136/82  Pulse: 94  Resp: 16  Temp: 98.1 F (36.7 C)  TempSrc: Oral  SpO2: 96%  Weight: 283 lb 6.4 oz (128.5 kg)  Height: 5\' 8"  (1.727 m)    Body mass index is 43.09 kg/m.  Physical Exam  Constitutional: Patient appears well-developed and well-nourished. Obese  No distress.  HEENT: head atraumatic, normocephalic, pupils equal and reactive to light,  neck supple, throat within normal limits Cardiovascular: Normal rate, regular rhythm and normal heart sounds.  No murmur heard. Trace  BLE edema. Pulmonary/Chest: Effort normal and breath sounds normal. No respiratory distress. Abdominal: Soft.   There is no tenderness. Psychiatric: Patient has a normal mood and affect. behavior is normal. Judgment and thought content normal.  PHQ2/9: Depression screen [facility] 2/9 02/05/2018 10/30/2017 09/22/2017 07/10/2017 11/07/2016  Decreased Interest 0 0 1 0 0  Down, Depressed, Hopeless 0 1 1 0 0  PHQ - 2 Score 0 1 2 0 0  Altered sleeping 0 2 2 3  -  Tired, decreased energy 0 1 3 1  -  Change in appetite 0 1 2 0 -  Feeling bad or failure about yourself  0 1 0 2 -  Trouble concentrating 0 1 0 2 -  Moving slowly or fidgety/restless 0 0 0 1 -  Suicidal thoughts 0 0 0 0 -  PHQ-9 Score 0 7 9 9  -  Difficult doing work/chores Not difficult at all Not difficult at all Not difficult at all Somewhat difficult -     Fall Risk: Fall Risk  02/05/2018 10/30/2017 09/22/2017 07/10/2017 03/27/2017  Falls in the past year? No No No No No  Number falls in past yr: - - - - -  Injury with Fall? - - - - -     Functional Status Survey: Is the patient deaf or have difficulty hearing?: No Does the patient have difficulty seeing, even when wearing glasses/contacts?: No Does the patient have difficulty concentrating, remembering, or making decisions?: No Does the patient have difficulty walking or climbing stairs?: No Does the patient have difficulty dressing or bathing?: No Does the patient have difficulty doing errands alone such as visiting a doctor's office or shopping?: No    Assessment & Plan   1. Essential hypertension  - COMPLETE METABOLIC PANEL WITH GFR - CBC with Differential/Platelet - Lipid panel  2. Need for shingles vaccine   not given , she will call insurance   3. Metabolic syndrome  - Hemoglobin A1c  4. Major depression in remission [facility]  Doing much better  5. OSA (obstructive sleep apnea)  -ambulatory referral to Sleep Studies  6. BMI 40.0-44.9, adult (HCC)  - Lorcaserin  HCl ER (BELVIQ XR) 20 MG TB24; Take 1 tablet by mouth daily.  Dispense: 30 tablet; Refill: 2  7. Primary  osteoarthritis of both knees  Stable

## 2018-02-06 LAB — LIPID PANEL
Cholesterol: 152 mg/dL (ref <200)
HDL: 72 mg/dL (ref >50)
LDL Cholesterol (Calc): 63 mg/dL (calc)
Non-HDL Cholesterol (Calc): 80 mg/dL (calc) (ref <130)
Total CHOL/HDL Ratio: 2.1 (calc) (ref <5.0)
Triglycerides: 91 mg/dL (ref <150)

## 2018-02-06 LAB — COMPLETE METABOLIC PANEL WITH GFR
AG Ratio: 1.4 (calc) (ref 1.0–2.5)
ALT: 11 U/L (ref 6–29)
AST: 13 U/L (ref 10–35)
Albumin: 4 g/dL (ref 3.6–5.1)
Alkaline phosphatase (APISO): 89 U/L (ref 33–130)
BUN: 12 mg/dL (ref 7–25)
CO2: 27 mmol/L (ref 20–32)
Calcium: 9 mg/dL (ref 8.6–10.4)
Chloride: 104 mmol/L (ref 98–110)
Creat: 0.69 mg/dL (ref 0.50–0.99)
GFR, Est African American: 109 mL/min/{1.73_m2} (ref >=60)
GFR, Est Non African American: 94 mL/min/{1.73_m2} (ref >=60)
Globulin: 2.9 g/dL (calc) (ref 1.9–3.7)
Glucose, Bld: 81 mg/dL (ref 65–139)
Potassium: 3.5 mmol/L (ref 3.5–5.3)
Sodium: 141 mmol/L (ref 135–146)
Total Bilirubin: 0.3 mg/dL (ref 0.2–1.2)
Total Protein: 6.9 g/dL (ref 6.1–8.1)

## 2018-02-06 LAB — CBC WITH DIFFERENTIAL/PLATELET
Basophils Absolute: 58 cells/uL (ref 0–200)
Basophils Relative: 0.6 %
Eosinophils Absolute: 192 cells/uL (ref 15–500)
Eosinophils Relative: 2 %
HCT: 39.3 % (ref 35.0–45.0)
Hemoglobin: 13.4 g/dL (ref 11.7–15.5)
Lymphs Abs: 2112 cells/uL (ref 850–3900)
MCH: 28.9 pg (ref 27.0–33.0)
MCHC: 34.1 g/dL (ref 32.0–36.0)
MCV: 84.7 fL (ref 80.0–100.0)
MPV: 10.8 fL (ref 7.5–12.5)
Monocytes Relative: 5.5 %
Neutro Abs: 6710 cells/uL (ref 1500–7800)
Neutrophils Relative %: 69.9 %
Platelets: 324 10*3/uL (ref 140–400)
RBC: 4.64 10*6/uL (ref 3.80–5.10)
RDW: 14.4 % (ref 11.0–15.0)
Total Lymphocyte: 22 %
WBC mixed population: 528 cells/uL (ref 200–950)
WBC: 9.6 10*3/uL (ref 3.8–10.8)

## 2018-02-06 LAB — HEMOGLOBIN A1C
Hgb A1c MFr Bld: 5.8 % of total Hgb — ABNORMAL HIGH (ref <5.7)
Mean Plasma Glucose: 120 (calc)
eAG (mmol/L): 6.6 (calc)

## 2018-02-26 ENCOUNTER — Telehealth: Payer: Self-pay | Admitting: Family Medicine

## 2018-02-26 NOTE — Telephone Encounter (Signed)
Please call pt back by 4 30 if possible,.

## 2018-03-12 ENCOUNTER — Ambulatory Visit (INDEPENDENT_AMBULATORY_CARE_PROVIDER_SITE_OTHER): Payer: BC Managed Care – PPO

## 2018-03-12 DIAGNOSIS — Z23 Encounter for immunization: Secondary | ICD-10-CM | POA: Diagnosis not present

## 2018-03-19 ENCOUNTER — Ambulatory Visit: Payer: BC Managed Care – PPO | Attending: Neurology

## 2018-03-19 DIAGNOSIS — G4733 Obstructive sleep apnea (adult) (pediatric): Secondary | ICD-10-CM | POA: Insufficient documentation

## 2018-03-19 DIAGNOSIS — Z9884 Bariatric surgery status: Secondary | ICD-10-CM | POA: Insufficient documentation

## 2018-03-19 DIAGNOSIS — F329 Major depressive disorder, single episode, unspecified: Secondary | ICD-10-CM | POA: Diagnosis not present

## 2018-03-19 DIAGNOSIS — I10 Essential (primary) hypertension: Secondary | ICD-10-CM | POA: Insufficient documentation

## 2018-03-19 DIAGNOSIS — Z6841 Body Mass Index (BMI) 40.0 and over, adult: Secondary | ICD-10-CM | POA: Insufficient documentation

## 2018-03-19 DIAGNOSIS — F419 Anxiety disorder, unspecified: Secondary | ICD-10-CM | POA: Diagnosis not present

## 2018-03-19 DIAGNOSIS — F5101 Primary insomnia: Secondary | ICD-10-CM | POA: Diagnosis not present

## 2018-04-01 ENCOUNTER — Telehealth: Payer: Self-pay | Admitting: Family Medicine

## 2018-04-01 NOTE — Telephone Encounter (Signed)
Pt is requesting return call. She is checking on her sleep study results. [phone]

## 2018-04-05 NOTE — Telephone Encounter (Signed)
Printed and will put in folder. Documents are under procedures on date 03/23/2018

## 2018-04-06 NOTE — Telephone Encounter (Signed)
I did not specify the type of mask, they will contact her, they can change if you don't tolerate it.

## 2018-04-06 NOTE — Telephone Encounter (Signed)
Pt informed and verbalized understanding

## 2018-04-06 NOTE — Telephone Encounter (Signed)
Pt would like to know if the mask that your ordering is like the one she use when taking the test. Its the one that's under the nose and want to know if its quite? Please return her call to discuss

## 2018-04-14 IMAGING — MG 2D DIGITAL SCREENING BILATERAL MAMMOGRAM WITH CAD AND ADJUNCT TO
8 of 14 series · 8 of 30 positions shown · non-contrast
Comparison: Previous exam(s).

CLINICAL DATA: Screening.

EXAM:
2D DIGITAL SCREENING BILATERAL MAMMOGRAM WITH CAD AND ADJUNCT TOMO

[L MLO (1 of 2)]
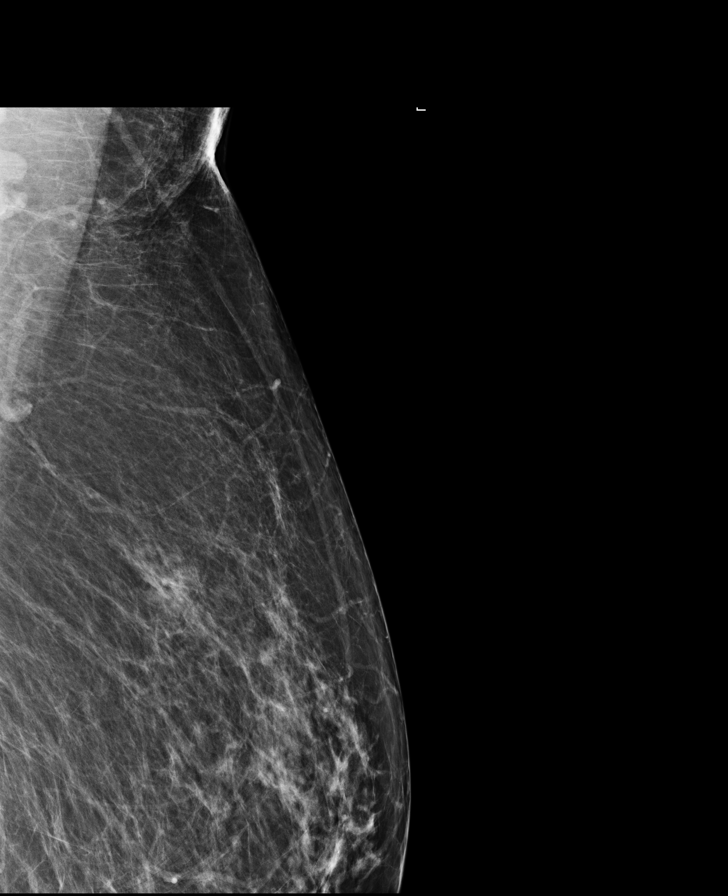

[R MLO (1 of 2)]
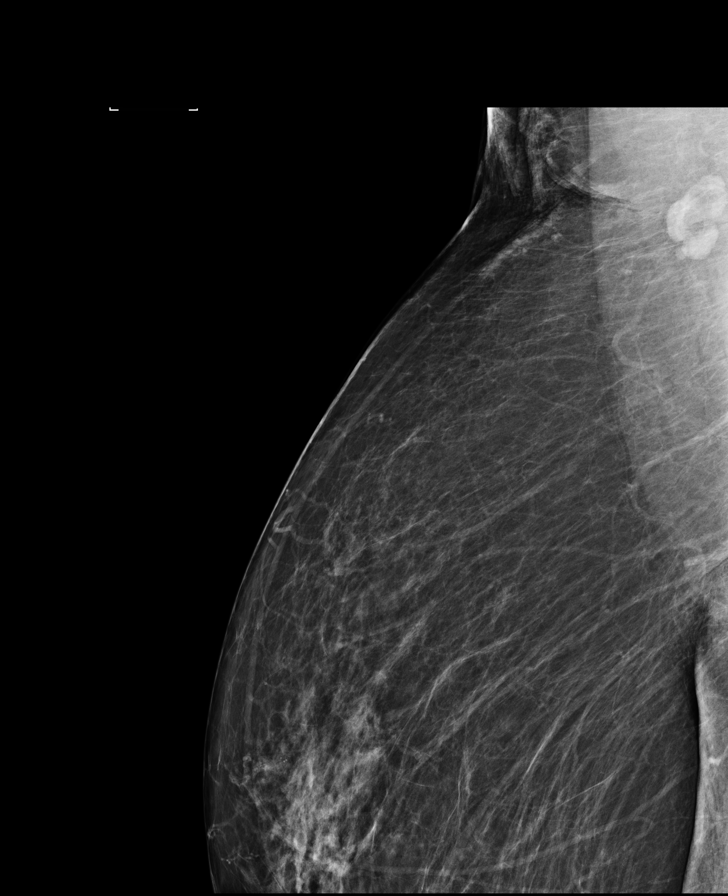

[R MLO (2 of 2)]
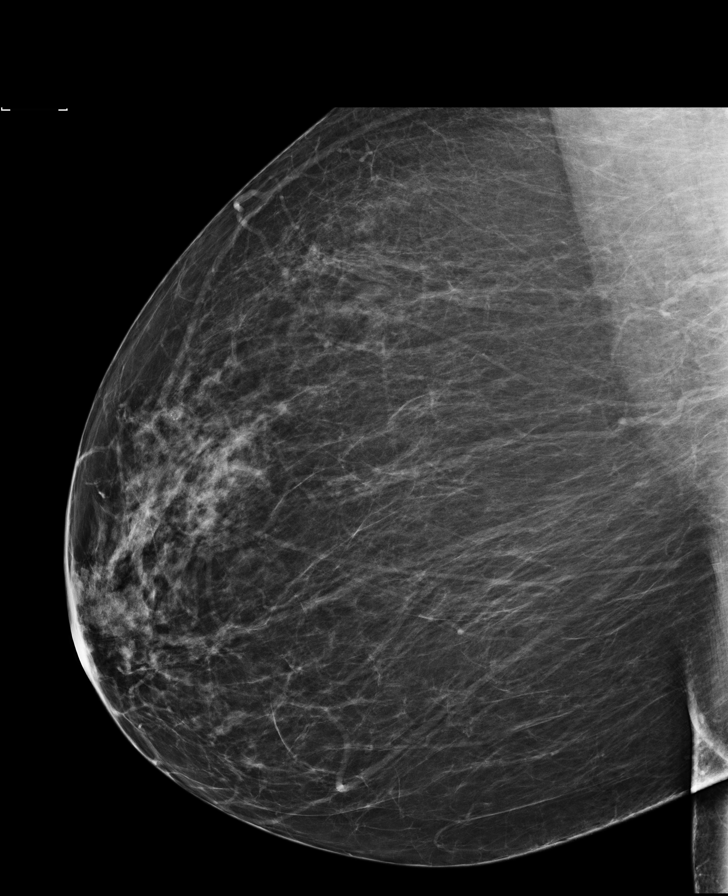

[R CC synth-2D]
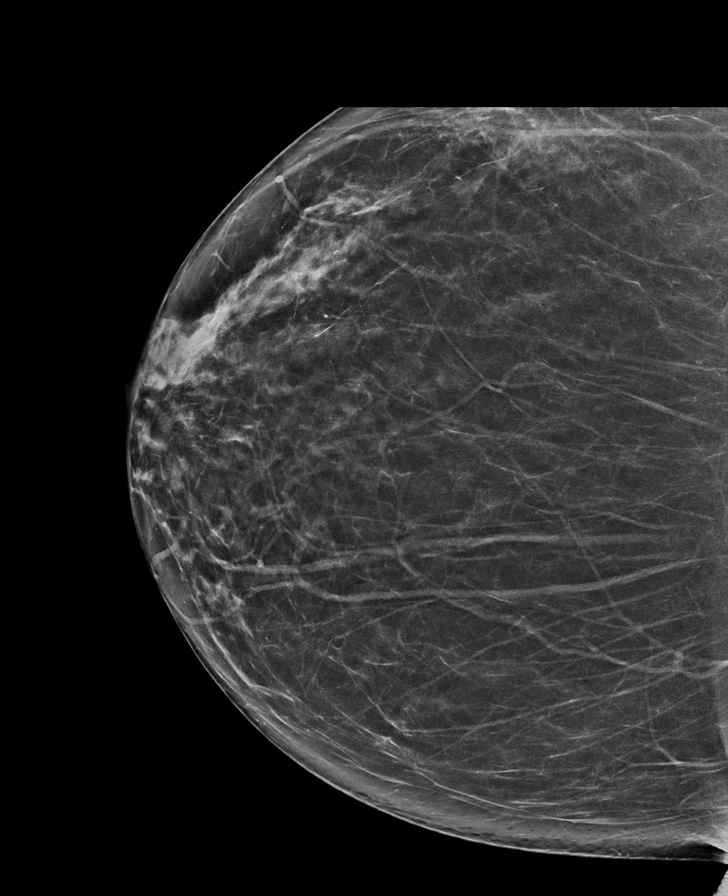

[L MLO (2 of 2)]
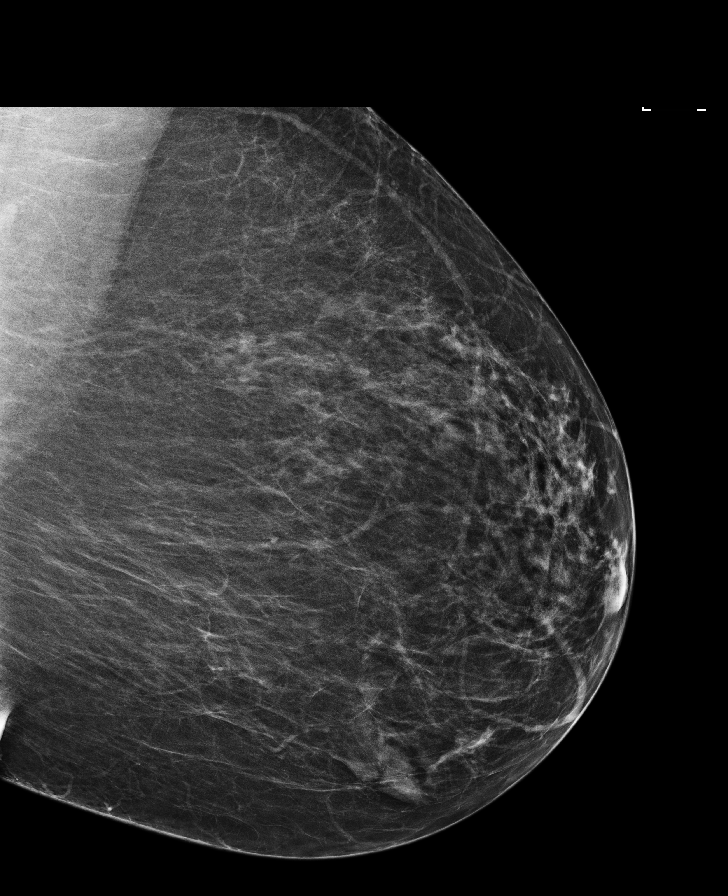

[L CC]
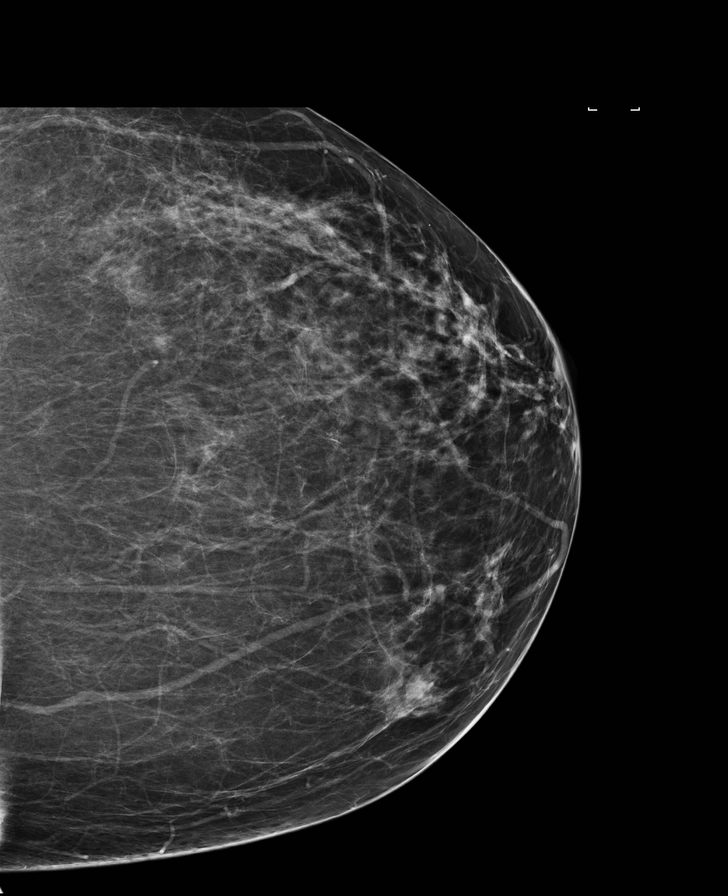

[L CC synth-2D]
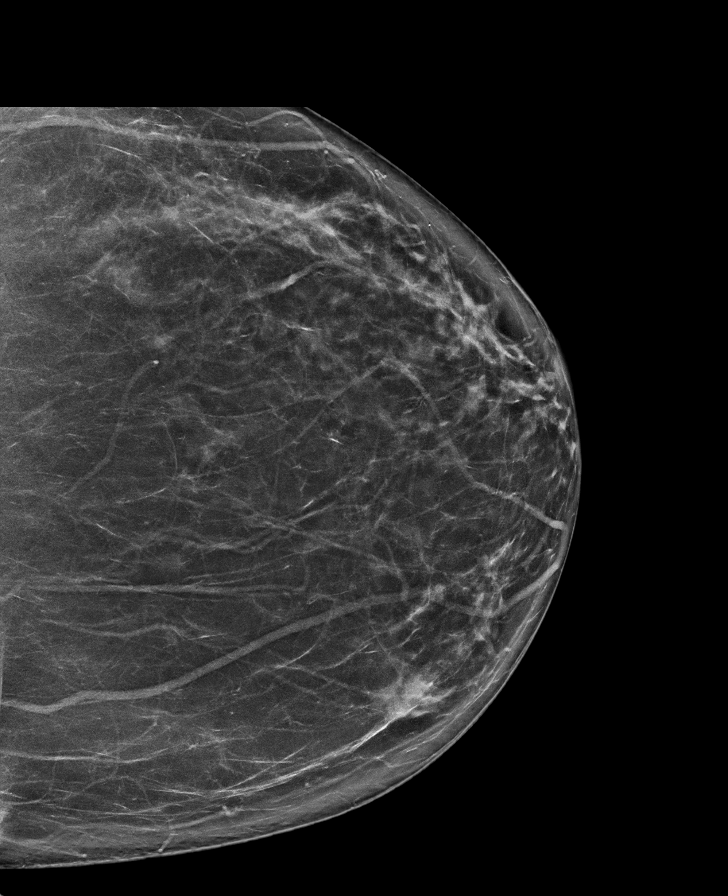

[R MLO synth-2D]
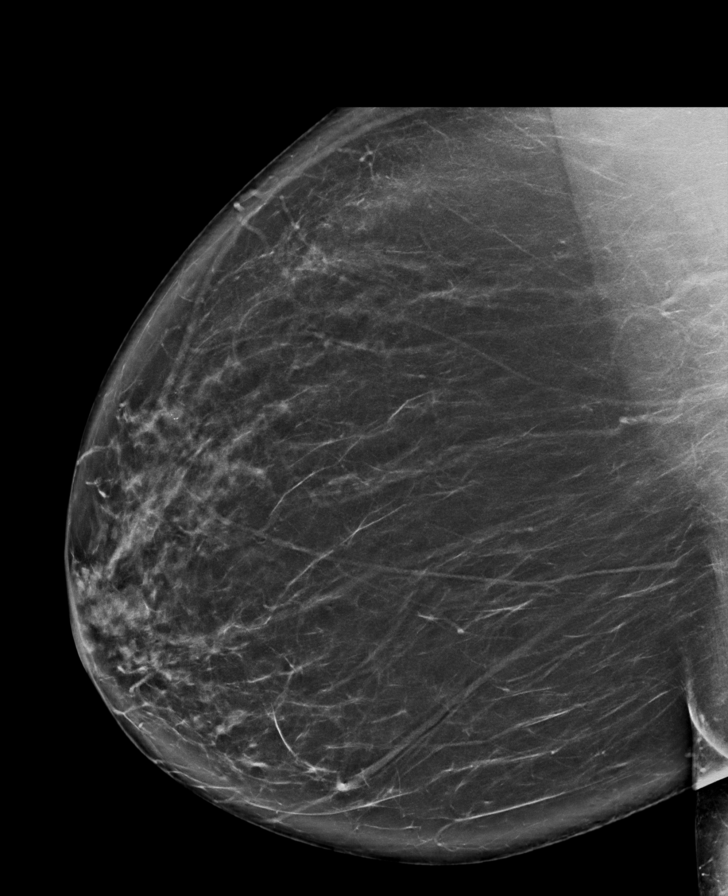

[8 of 30 positions shown; findings below may reference images not displayed]

ACR Breast Density Category b: There are scattered areas of
fibroglandular density.
FINDINGS: There are no findings suspicious for malignancy. Images were
processed with CAD.
IMPRESSION: No mammographic evidence of malignancy. A result letter of this
screening mammogram will be mailed directly to the patient.

RECOMMENDATION:
Screening mammogram in one year. (Code:97-6-RS4)

BI-RADS CATEGORY  1: Negative.

## 2018-04-16 ENCOUNTER — Encounter: Payer: Self-pay | Admitting: Family Medicine

## 2018-04-26 ENCOUNTER — Ambulatory Visit: Payer: BC Managed Care – PPO | Admitting: Nurse Practitioner

## 2018-04-26 ENCOUNTER — Encounter: Payer: Self-pay | Admitting: Nurse Practitioner

## 2018-04-26 VITALS — BP 130/84 | HR 70 | Temp 97.9°F | Resp 16 | Ht 68.0 in | Wt 279.6 lb

## 2018-04-26 DIAGNOSIS — J029 Acute pharyngitis, unspecified: Secondary | ICD-10-CM | POA: Diagnosis not present

## 2018-04-26 DIAGNOSIS — R52 Pain, unspecified: Secondary | ICD-10-CM | POA: Diagnosis not present

## 2018-04-26 DIAGNOSIS — R05 Cough: Secondary | ICD-10-CM | POA: Diagnosis not present

## 2018-04-26 DIAGNOSIS — R058 Other specified cough: Secondary | ICD-10-CM

## 2018-04-26 DIAGNOSIS — R5383 Other fatigue: Secondary | ICD-10-CM | POA: Diagnosis not present

## 2018-04-26 LAB — POCT INFLUENZA A/B
Influenza A, POC: NEGATIVE
Influenza B, POC: NEGATIVE

## 2018-04-26 MED ORDER — BENZONATATE 100 MG PO CAPS: 100.0000 mg | ORAL_CAPSULE | Freq: Two times a day (BID) | ORAL | 0 refills | Status: DC | PRN

## 2018-04-26 MED ORDER — MAGIC MOUTHWASH W/LIDOCAINE: 5.0000 mL | Freq: Three times a day (TID) | ORAL | 0 refills | Status: DC | PRN

## 2018-04-26 NOTE — Progress Notes (Signed)
Name: Regina Christensen   MRN: [id number]    DOB: 1955-08-26   Date:04/26/2018       Progress Note  Subjective  Chief Complaint  Chief Complaint  Patient presents with  . Sore Throat    moreso an irritation than soreness that started today  . Cough    dry    HPI  Patient presents for sore throat started today and dry cough started yesterday. States about 5 days ago her sinus headache that she had for a week finally resolved. States she feels she pushed herself too hard and feels exhausted. Had some mild body aches last night that resolved with tylenol- none today.   No sinus pain, jaw pain, fevers, chills, shortness of breath, chest pain, muffled sounds.   Patient Active Problem List   Diagnosis Date Noted  . Bilateral carpal tunnel syndrome 07/30/2016  . OSA (obstructive sleep apnea) 03/07/2016  . Depression, major, recurrent, mild (HCC) 11/30/2015  . Osteoarthritis of both knees 11/30/2015  . BPPV (benign paroxysmal positional vertigo) 08/28/2015  . Essential hypertension 11/09/2014  . Hyperglycemia 11/09/2014  . GAD (generalized anxiety disorder) 11/09/2014  . Acanthosis nigricans 11/09/2014  . Morbid obesity (HCC) 01/06/2012    Past Medical History:  Diagnosis Date  . Arthritis   . Depression   . Hypertension   . Morbid obesity with BMI of 45.0-49.9, adult (HCC)   . Obesity   . Sleep apnea     Past Surgical History:  Procedure Laterality Date  . ABDOMINAL HYSTERECTOMY    . BREAST EXCISIONAL BIOPSY Left   . ENDOMETRIAL ABLATION     x2  . GASTRIC BYPASS     sleeve    Social History   Tobacco Use  . Smoking status: Never Smoker  . Smokeless tobacco: Never Used  Substance Use Topics  . Alcohol use: No    Alcohol/week: 0.0 standard drinks    Comment: rare     Current Outpatient Medications:  .  acetaminophen (TYLENOL 8 HOUR ARTHRITIS PAIN) 650 MG CR tablet, Take 1 tablet (650 mg total) by mouth every 8 (eight) hours as needed for pain. (Patient taking  differently: Take 1,300 mg by mouth every 8 (eight) hours as needed for pain. ), Disp: 90 tablet, Rfl: 0 .  albuterol (PROVENTIL HFA;VENTOLIN HFA) 108 (90 Base) MCG/ACT inhaler, TAKE 2 PUFFS BY MOUTH EVERY 6 HOURS AS NEEDED FOR WHEEZE OR SHORTNESS OF BREATH, Disp: 8.5 Inhaler, Rfl: 0 .  ALPRAZolam (XANAX) 0.5 MG tablet, Take 1 tablet (0.5 mg total) by mouth daily as needed., Disp: 30 tablet, Rfl: 0 .  amLODipine-olmesartan (AZOR) 5-40 MG tablet, Take 1 tablet by mouth daily., Disp: 90 tablet, Rfl: 1 .  Armodafinil 150 MG tablet, Take 1 tablet (150 mg total) by mouth daily., Disp: 30 tablet, Rfl: 2 .  aspirin EC 81 MG tablet, Take 81 mg by mouth daily., Disp: , Rfl:  .  cloNIDine (CATAPRES - DOSED IN MG/24 HR) 0.1 mg/24hr patch, PLACE 1 PATCH (0.1 MG TOTAL) ONTO THE SKIN ONCE A WEEK., Disp: 12 patch, Rfl: 1 .  fexofenadine (ALLEGRA) 180 MG tablet, Take 180 mg by mouth daily., Disp: , Rfl:  .  Lorcaserin HCl ER (BELVIQ XR) 20 MG TB24, Take 1 tablet by mouth daily., Disp: 30 tablet, Rfl: 2 .  meloxicam (MOBIC) 7.5 MG tablet, TAKE 1 TABLET BY MOUTH EVERY DAY (Patient taking differently: TAKE 1 TABLET BY MOUTH AS NEEDED), Disp: 30 tablet, Rfl: 0 .  nebivolol (BYSTOLIC) 5  MG tablet, Take 1 tablet (5 mg total) by mouth daily., Disp: 90 tablet, Rfl: 1 .  Semaglutide (OZEMPIC) 1 MG/DOSE SOPN, Inject 1 mg into the skin once a week., Disp: 9 mL, Rfl: 1 .  triamcinolone (NASACORT) 55 MCG/ACT AERO nasal inhaler, Place 2 sprays into the nose daily., Disp: , Rfl:  .  Triamcinolone Acetonide (TRIAMCINOLONE 0.1 % CREAM : EUCERIN) CREA, Apply 1 application topically 2 (two) times daily., Disp: 1 each, Rfl: 0 .  venlafaxine XR (EFFEXOR-XR) 150 MG 24 hr capsule, TAKE 1 CAPSULE BY MOUTH EVERY DAY, Disp: 90 capsule, Rfl: 1 .  XIIDRA 5 % SOLN, Place 1 drop into both eyes 2 (two) times daily., Disp: , Rfl: 4  Allergies  Allergen Reactions  . Ace Inhibitors Cough  . Hctz [Hydrochlorothiazide] Rash    face     ROS   No other specific complaints in a complete review of systems (except as listed in HPI above).  Objective  Vitals:   04/26/18 1335  BP: 130/84  Pulse: 70  Resp: 16  Temp: 97.9 F (36.6 C)  TempSrc: Oral  SpO2: 99%  Weight: 279 lb 9.6 oz (126.8 kg)  Height: 5\' 8"  (1.727 m)    Body mass index is 42.51 kg/m.  Nursing Note and Vital Signs reviewed.  Physical Exam HENT:     Head: Normocephalic and atraumatic.     Right Ear: Hearing, tympanic membrane, ear canal and external ear normal.     Left Ear: Hearing, tympanic membrane, ear canal and external ear normal.     Nose: Nose normal.     Right Sinus: No maxillary sinus tenderness or frontal sinus tenderness.     Left Sinus: No maxillary sinus tenderness or frontal sinus tenderness.     Mouth/Throat:     Mouth: Mucous membranes are moist.     Pharynx: Uvula midline. No oropharyngeal exudate or posterior oropharyngeal erythema.  Eyes:     General:        Right eye: No discharge.        Left eye: No discharge.     Conjunctiva/sclera: Conjunctivae normal.  Neck:     Musculoskeletal: Normal range of motion.  Cardiovascular:     Rate and Rhythm: Normal rate.  Pulmonary:     Effort: Pulmonary effort is normal.     Breath sounds: Normal breath sounds.  Lymphadenopathy:     Cervical: No cervical adenopathy.  Skin:    General: Skin is warm and dry.     Findings: No rash.  Neurological:     Mental Status: She is alert.  Psychiatric:        Judgment: Judgment normal.       No results found for this or any previous visit (from the past 48 hour(s)).  Assessment & Plan   1. Sore throat Negative flu- discussed likely URI and OTC treatments.  - POCT Influenza A/B - magic mouthwash w/lidocaine SOLN; Take 5 mLs by mouth 3 (three) times daily as needed for mouth pain.  Dispense: 100 mL; Refill: 0  2. Dry cough - POCT Influenza A/B - benzonatate (TESSALON) 100 MG capsule; Take 1 capsule (100 mg total) by  mouth 2 (two) times daily as needed for cough.  Dispense: 20 capsule; Refill: 0  3. Fatigue, unspecified type - POCT Influenza A/B  4. Body aches - POCT Influenza A/B

## 2018-04-26 NOTE — Patient Instructions (Signed)
You likely have a viral upper respiratory infection (URI). Antibiotics will not reduce the number of days you are ill or prevent you from getting bacterial rhinosinusitis. A URI can take up to 14 days to resolve, but typically last between 7-11 days. Your body is so smart and strong that it will be fighting this illness off for you but it is important that you drink plenty of fluids, rest. Cover your nose/mouth when you cough or sneeze and wash your hands well and often. Here are some helpful things you can use or pick up over the counter from the pharmacy to help with your symptoms:   For Fever/Pain: Acetaminophen every 6 hours as needed (maximum of 3000mg  a day). If you are still uncomfortable you can add ibuprofen OR naproxen  For coughing: try dextromethorphan for a cough suppressant, and/or a cool mist humidifier, lozenges  For sore throat: saline gargles, honey herbal tea, lozenges, throat spray  To dry out your nose: try an antihistamine like loratadine (non-sedating) or diphenhydramine (sedating) or others To relieve a stuffy nose: try flonase, or neti pot To make blowing your nose easier: guaifenesin 200-600mg  every 6 hours.

## 2018-04-28 ENCOUNTER — Other Ambulatory Visit: Payer: Self-pay | Admitting: Family Medicine

## 2018-04-28 DIAGNOSIS — I10 Essential (primary) hypertension: Secondary | ICD-10-CM

## 2018-05-01 ENCOUNTER — Other Ambulatory Visit: Payer: Self-pay | Admitting: Family Medicine

## 2018-05-01 DIAGNOSIS — I10 Essential (primary) hypertension: Secondary | ICD-10-CM

## 2018-05-14 ENCOUNTER — Ambulatory Visit: Payer: BC Managed Care – PPO | Admitting: Family Medicine

## 2018-06-03 ENCOUNTER — Other Ambulatory Visit: Payer: Self-pay | Admitting: Family Medicine

## 2018-06-03 DIAGNOSIS — F330 Major depressive disorder, recurrent, mild: Secondary | ICD-10-CM

## 2018-06-03 DIAGNOSIS — F411 Generalized anxiety disorder: Secondary | ICD-10-CM

## 2018-06-11 ENCOUNTER — Encounter: Payer: Self-pay | Admitting: Family Medicine

## 2018-06-11 ENCOUNTER — Ambulatory Visit: Payer: BC Managed Care – PPO | Admitting: Family Medicine

## 2018-06-11 DIAGNOSIS — G4733 Obstructive sleep apnea (adult) (pediatric): Secondary | ICD-10-CM

## 2018-06-11 DIAGNOSIS — E8881 Metabolic syndrome: Secondary | ICD-10-CM

## 2018-06-11 DIAGNOSIS — J989 Respiratory disorder, unspecified: Secondary | ICD-10-CM

## 2018-06-11 DIAGNOSIS — I10 Essential (primary) hypertension: Secondary | ICD-10-CM | POA: Diagnosis not present

## 2018-06-11 DIAGNOSIS — K219 Gastro-esophageal reflux disease without esophagitis: Secondary | ICD-10-CM

## 2018-06-11 DIAGNOSIS — F330 Major depressive disorder, recurrent, mild: Secondary | ICD-10-CM

## 2018-06-11 DIAGNOSIS — F411 Generalized anxiety disorder: Secondary | ICD-10-CM

## 2018-06-11 DIAGNOSIS — R7303 Prediabetes: Secondary | ICD-10-CM

## 2018-06-11 DIAGNOSIS — R0989 Other specified symptoms and signs involving the circulatory and respiratory systems: Secondary | ICD-10-CM

## 2018-06-11 MED ORDER — OMEPRAZOLE 40 MG PO CPDR
40.0000 mg | DELAYED_RELEASE_CAPSULE | Freq: Every day | ORAL | 3 refills | Status: DC
Start: 2018-06-11 — End: 2018-09-15

## 2018-06-11 MED ORDER — VENLAFAXINE HCL ER 150 MG PO CP24: 150.0000 mg | ORAL_CAPSULE | Freq: Every day | ORAL | 0 refills | Status: DC

## 2018-06-11 MED ORDER — SEMAGLUTIDE (1 MG/DOSE) 2 MG/1.5ML ~~LOC~~ SOPN: 1.0000 mg | PEN_INJECTOR | SUBCUTANEOUS | 1 refills | Status: DC

## 2018-06-11 MED ORDER — NEBIVOLOL HCL 10 MG PO TABS: 10.0000 mg | ORAL_TABLET | Freq: Every day | ORAL | 1 refills | Status: DC

## 2018-06-11 MED ORDER — ARMODAFINIL 150 MG PO TABS: 150.0000 mg | ORAL_TABLET | Freq: Every day | ORAL | 2 refills | Status: DC

## 2018-06-11 NOTE — Patient Instructions (Signed)
Food Choices for Gastroesophageal Reflux Disease, Adult  When you have gastroesophageal reflux disease (GERD), the foods you eat and your eating habits are very important. Choosing the right foods can help ease the discomfort of GERD. Consider working with a diet and nutrition specialist (dietitian) to help you make healthy food choices.  What general guidelines should I follow?    Eating plan  · Choose healthy foods low in fat, such as fruits, vegetables, whole grains, low-fat dairy products, and lean meat, fish, and poultry.  · Eat frequent, small meals instead of three large meals each day. Eat your meals slowly, in a relaxed setting. Avoid bending over or lying down until 2-3 hours after eating.  · Limit high-fat foods such as fatty meats or fried foods.  · Limit your intake of oils, butter, and shortening to less than 8 teaspoons each day.  · Avoid the following:  ? Foods that cause symptoms. These may be different for different people. Keep a food diary to keep track of foods that cause symptoms.  ? Alcohol.  ? Drinking large amounts of liquid with meals.  ? Eating meals during the 2-3 hours before bed.  · Cook foods using methods other than frying. This may include baking, grilling, or broiling.  Lifestyle  · Maintain a healthy weight. Ask your health care provider what weight is healthy for you. If you need to lose weight, work with your health care provider to do so safely.  · Exercise for at least 30 minutes on 5 or more days each week, or as told by your health care provider.  · Avoid wearing clothes that fit tightly around your waist and chest.  · Do not use any products that contain nicotine or tobacco, such as cigarettes and e-cigarettes. If you need help quitting, ask your health care provider.  · Sleep with the head of your bed raised. Use a wedge under the mattress or blocks under the bed frame to raise the head of the bed.  What foods are not recommended?  The items listed may not be a complete  list. Talk with your dietitian about what dietary choices are best for you.  Grains  Pastries or quick breads with added fat. French toast.  Vegetables  Deep fried vegetables. French fries. Any vegetables prepared with added fat. Any vegetables that cause symptoms. For some people this may include tomatoes and tomato products, chili peppers, onions and garlic, and horseradish.  Fruits  Any fruits prepared with added fat. Any fruits that cause symptoms. For some people this may include citrus fruits, such as oranges, grapefruit, pineapple, and lemons.  Meats and other protein foods  High-fat meats, such as fatty beef or pork, hot dogs, ribs, ham, sausage, salami and bacon. Fried meat or protein, including fried fish and fried chicken. Nuts and nut butters.  Dairy  Whole milk and chocolate milk. Sour cream. Cream. Ice cream. Cream cheese. Milk shakes.  Beverages  Coffee and tea, with or without caffeine. Carbonated beverages. Sodas. Energy drinks. Fruit juice made with acidic fruits (such as orange or grapefruit). Tomato juice. Alcoholic drinks.  Fats and oils  Butter. Margarine. Shortening. Ghee.  Sweets and desserts  Chocolate and cocoa. Donuts.  Seasoning and other foods  Pepper. Peppermint and spearmint. Any condiments, herbs, or seasonings that cause symptoms. For some people, this may include curry, hot sauce, or vinegar-based salad dressings.  Summary  · When you have gastroesophageal reflux disease (GERD), food and lifestyle choices are very   important to help ease the discomfort of GERD.  · Eat frequent, small meals instead of three large meals each day. Eat your meals slowly, in a relaxed setting. Avoid bending over or lying down until 2-3 hours after eating.  · Limit high-fat foods such as fatty meat or fried foods.  This information is not intended to replace advice given to you by your health care provider. Make sure you discuss any questions you have with your health care provider.  Document Released:  04/07/2005 Document Revised: 04/08/2016 Document Reviewed: 04/08/2016  Elsevier Interactive Patient Education © 2019 [employer]

## 2018-06-11 NOTE — Progress Notes (Signed)
Name: Regina Christensen   MRN: [id number]    DOB: 10/08/1955   Date:06/13/2018       Progress Note  Subjective  Chief Complaint  Chief Complaint  Patient presents with  . Hypertension  . Headache  . Depression    HPI  HTN: she was Valsartan 320mg  and hydralazine, off HCTZ because of hypokalemia and it was causing facial irritation.She is onClonidine,AzorandBystolic 01/2017 and she is doing well , no side effects of medication and bphas been at goal. She has lower extremity edema and is wearing compression stocking hoses, she took lasix in the past and asked for a refill but explained not as safe as compression stocking hoses  Morbid Obesity: she had bariatric surgery about 5 years ago, original weight prior to surgery was around 313 lbs, she lost 50 lbs but gradually gained it back.  Shewason Victoza for pre-diabetes and weight loss, but it stopped working, we gave herOzempicOct 2018 but did not lose weight, since Dec 2018 she is on Ozempic 1 mg daily, she lost 10 lbs we added Belviq but it has been recalled, she has been out of medication for the past couple of weeks.   Acanthosis nigricans/Metabolic syndrome: she denies polyphagia, polyuria or polydipsia, last hgbA1C wasdown from 6.0% to 5.8% she [facility] on [facility] Fall 2017and [facility] tolerating medication well. Last hgbA1C 5.8%  OSA: never able to tolerate CPAP, she has fatigue, day sleepiness, mental fogginess, shehas seen dentist and is wearing a mouth piece, she states the mouth piece but recent note from dentist states did not reverse symptoms and she needs to have a repeat sleep study.  She is on Nuvigil but still feels tired, she also wakes up with headache at times.   Depression Mild:   she is taking Effexor for many years,phq 9 was zero today , she is retirement age but cannot afford it and is feeling trapped right now. She has friends that have retired and gets frustrated.   OA:  off Meloxicam, taking Tylenol and seems to help with knee pain, pain is described as aching worse with activity and with cold weather   GERD: she states that symptoms have been present  for over 6 months, she states she has a burning sensation on her throat, clearing her throat.No regurgitation, no epigastric pain. She denies change in bowel movement.  Improves with PPI otc but not taking recently. Symptoms are getting progressively worse. No weight changes   Patient Active Problem List   Diagnosis Date Noted  . Bilateral carpal tunnel syndrome 07/30/2016  . OSA (obstructive sleep apnea) 03/07/2016  . Depression, major, recurrent, mild (HCC) 11/30/2015  . Osteoarthritis of both knees 11/30/2015  . BPPV (benign paroxysmal positional vertigo) 08/28/2015  . Essential hypertension 11/09/2014  . Hyperglycemia 11/09/2014  . GAD (generalized anxiety disorder) 11/09/2014  . Acanthosis nigricans 11/09/2014  . Morbid obesity (HCC) 01/06/2012    Past Surgical History:  Procedure Laterality Date  . ABDOMINAL HYSTERECTOMY    . BREAST EXCISIONAL BIOPSY Left   . ENDOMETRIAL ABLATION     x2  . GASTRIC BYPASS     sleeve    Family History  Problem Relation Age of Onset  . Heart disease Father   . Cancer Brother     Social History   Socioeconomic History  . Marital status: Divorced    Spouse name: Not on file  . Number of children: 2  . Years of education: Not on file  .  Highest education level: High school graduate  Occupational History  . Occupation: patient Tax adviser: [employer]  Social Needs  . Financial resource strain: Not hard at all  . Food insecurity:    Worry: Never true    Inability: Never true  . Transportation needs:    Medical: No    Non-medical: No  Tobacco Use  . Smoking status: Never Smoker  . Smokeless tobacco: Never Used  Substance and Sexual Activity  . Alcohol use: No    Alcohol/week: 0.0 standard drinks    Comment: rare  . Drug use: No  .  Sexual activity: Never  Lifestyle  . Physical activity:    Days per week: 0 days    Minutes per session: 0 min  . Stress: Rather much  Relationships  . Social connections:    Talks on phone: More than three times a week    Gets together: More than three times a week    Attends religious service: More than 4 times per year    Active member of club or organization: No    Attends meetings of clubs or organizations: Never    Relationship status: Divorced  . Intimate partner violence:    Fear of current or ex partner: No    Emotionally abused: No    Physically abused: No    Forced sexual activity: No  Other Topics Concern  . 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  acetaminophen (TYLENOL 8 HOUR ARTHRITIS PAIN) 650 MG CR tablet, Take 1 tablet (650 mg total) by mouth every 8 (eight) hours as needed for pain. (Patient taking differently: Take 1,300 mg by mouth every 8 (eight) hours as needed for pain. ), Disp: 90 tablet, Rfl: 0 .  albuterol (PROVENTIL HFA;VENTOLIN HFA) 108 (90 Base) MCG/ACT inhaler, TAKE 2 PUFFS BY MOUTH EVERY 6 HOURS AS NEEDED FOR WHEEZE OR SHORTNESS OF BREATH, Disp: 8.5 Inhaler, Rfl: 0 .  ALPRAZolam (XANAX) 0.5 MG tablet, Take 1 tablet (0.5 mg total) by mouth daily as needed., Disp: 30 tablet, Rfl: 0 .  amLODipine-olmesartan (AZOR) 5-40 MG tablet, TAKE 1 TABLET BY MOUTH EVERY DAY, Disp: 90 tablet, Rfl: 1 .  aspirin EC 81 MG tablet, Take 81 mg by mouth daily., Disp: , Rfl:  .  cloNIDine (CATAPRES - DOSED IN MG/24 HR) 0.1 mg/24hr patch, PLACE 1 PATCH (0.1 MG TOTAL) ONTO THE SKIN ONCE A WEEK., Disp: 12 patch, Rfl: 1 .  fexofenadine (ALLEGRA) 180 MG tablet, Take 180 mg by mouth daily., Disp: , Rfl:  .  nebivolol (BYSTOLIC) 10 MG tablet, Take 1 tablet (10 mg total) by mouth daily., Disp: 90 tablet, Rfl: 1 .   Semaglutide, 1 MG/DOSE, (OZEMPIC, 1 MG/DOSE,) 2 MG/1.5ML SOPN, Inject 1 mg into the skin once a week., Disp: 9 mL, Rfl: 1 .  triamcinolone (NASACORT) 55 MCG/ACT AERO nasal inhaler, Place 2 sprays into the nose daily., Disp: , Rfl:  .  Triamcinolone Acetonide (TRIAMCINOLONE 0.1 % CREAM : EUCERIN) CREA, Apply 1 application topically 2 (two) times daily., Disp: 1 each, Rfl: 0 .  venlafaxine XR (EFFEXOR-XR) 150 MG 24 hr capsule, Take 1 capsule (150 mg total) by mouth daily., Disp: 90 capsule, Rfl: 0 .  XIIDRA 5 % SOLN, Place 1  drop into both eyes 2 (two) times daily., Disp: , Rfl: 4 .  Armodafinil 150 MG tablet, Take 1 tablet (150 mg total) by mouth daily., Disp: 30 tablet, Rfl: 2 .  omeprazole (PRILOSEC) 40 MG capsule, Take 1 capsule (40 mg total) by mouth daily., Disp: 30 capsule, Rfl: 3  Allergies  Allergen Reactions  . Ace Inhibitors Cough  . Hctz [Hydrochlorothiazide] Rash    face    I personally reviewed active problem list, medication list, allergies, family history, social history with the patient/caregiver today.   ROS  Constitutional: Negative for fever or weight change.  Respiratory: Negative for cough and shortness of breath.   Cardiovascular: Negative for chest pain or palpitations.  Gastrointestinal: Negative for abdominal pain, no bowel changes.  Musculoskeletal: Negative for gait problem or joint swelling.  Skin: Negative for rash.  Neurological: Negative for dizziness or headache.  No other specific complaints in a complete review of systems (except as listed in HPI above).  Objective  Vitals:   06/11/18 1451  BP: 140/80  Pulse: 90  Resp: 16  Temp: 98.2 F (36.8 C)  TempSrc: Oral  SpO2: 96%  Weight: 282 lb 4.8 oz (128.1 kg)  Height: 5\' 8"  (1.727 m)    Body mass index is 42.92 kg/m.  Physical Exam  Constitutional: Patient appears well-developed and well-nourished. Obese  No distress.  HEENT: head atraumatic, normocephalic, pupils equal and reactive to  light,neck supple, throat within normal limits Cardiovascular: Normal rate, regular rhythm and normal heart sounds.  No murmur heard. No BLE edema. Pulmonary/Chest: Effort normal and breath sounds normal. No respiratory distress. Abdominal: Soft.  There is no tenderness. Muscular Skeletal: crepitus with extension of both knees  Psychiatric: Patient has a normal mood and affect. behavior is normal. Judgment and thought content normal.  Recent Results (from the past 2160 hour(s))  POCT Influenza A/B     Status: Normal   Collection Time: 04/26/18  2:06 PM  Result Value Ref Range   Influenza A, POC Negative Negative   Influenza B, POC Negative Negative     PHQ2/9: [facility] 06/11/2018 04/26/2018 02/05/2018 10/30/2017 09/22/2017  Decreased Interest 1 0 0 0 1  Down, Depressed, Hopeless 1 0 0 1 1  PHQ - 2 Score 2 0 0 1 2  Altered sleeping 1 - 0 2 2  Tired, decreased energy 1 - 0 1 3  Change in appetite 1 - 0 1 2  Feeling bad or failure about yourself  2 - 0 1 0  Trouble concentrating 1 - 0 1 0  Moving slowly or fidgety/restless 0 - 0 0 0  Suicidal thoughts - - 0 0 0  PHQ-9 Score 8 - 0 7 9  Difficult doing work/chores Somewhat difficult - Not difficult at all Not difficult at all Not difficult at all     Fall Risk: Fall Risk  06/11/2018 04/26/2018 02/05/2018 10/30/2017 09/22/2017  Falls in the past year? 0 0 No No No  Number falls in past yr: 0 0 - - -  Injury with Fall? 0 0 - - -     Assessment & Plan  1. OSA (obstructive sleep apnea)  - Armodafinil 150 MG tablet; Take 1 tablet (150 mg total) by mouth daily.  Dispense: 30 tablet; Refill: 2  2. Essential hypertension  - nebivolol (BYSTOLIC) 10 MG tablet; Take 1 tablet (10 mg total) by mouth daily.  Dispense: 90 tablet; Refill: 1  3. Metabolic syndrome  - Semaglutide, 1  MG/DOSE, (OZEMPIC, 1 MG/DOSE,) 2 MG/1.5ML SOPN; Inject 1 mg into the skin once a week.  Dispense: 9 mL; Refill: 1  4. Depression, major, recurrent,  mild (HCC)  - venlafaxine XR (EFFEXOR-XR) 150 MG 24 hr capsule; Take 1 capsule (150 mg total) by mouth daily.  Dispense: 90 capsule; Refill: 0  5. GAD (generalized anxiety disorder)  - venlafaxine XR (EFFEXOR-XR) 150 MG 24 hr capsule; Take 1 capsule (150 mg total) by mouth daily.  Dispense: 90 capsule; Refill: 0  7. Prediabetes  Continue Ozempic   8. Reactive airway disease that is not asthma  Not coughing or wheezing at this time  9. GERD without esophagitis  - omeprazole (PRILOSEC) 40 MG capsule; Take 1 capsule (40 mg total) by mouth daily.  Dispense: 30 capsule; Refill: 3

## 2018-06-17 ENCOUNTER — Encounter: Payer: Self-pay | Admitting: Emergency Medicine

## 2018-06-17 ENCOUNTER — Ambulatory Visit: Payer: BC Managed Care – PPO | Admitting: Family Medicine

## 2018-06-17 ENCOUNTER — Other Ambulatory Visit: Payer: Self-pay

## 2018-06-17 ENCOUNTER — Encounter: Payer: Self-pay | Admitting: Family Medicine

## 2018-06-17 VITALS — BP 132/84 | HR 76 | Temp 99.0°F | Resp 16 | Ht 68.0 in | Wt 280.0 lb

## 2018-06-17 DIAGNOSIS — R0981 Nasal congestion: Secondary | ICD-10-CM | POA: Diagnosis not present

## 2018-06-17 DIAGNOSIS — H66002 Acute suppurative otitis media without spontaneous rupture of ear drum, left ear: Secondary | ICD-10-CM

## 2018-06-17 DIAGNOSIS — I10 Essential (primary) hypertension: Secondary | ICD-10-CM

## 2018-06-17 DIAGNOSIS — R739 Hyperglycemia, unspecified: Secondary | ICD-10-CM

## 2018-06-17 MED ORDER — GUAIFENESIN ER 600 MG PO TB12: 600.0000 mg | ORAL_TABLET | Freq: Two times a day (BID) | ORAL | 0 refills | Status: DC

## 2018-06-17 MED ORDER — AMOXICILLIN-POT CLAVULANATE 875-125 MG PO TABS: 1.0000 | ORAL_TABLET | Freq: Two times a day (BID) | ORAL | 0 refills | Status: AC

## 2018-06-17 MED ORDER — AZELASTINE HCL 0.1 % NA SOLN: 2.0000 | Freq: Two times a day (BID) | NASAL | 0 refills | Status: DC

## 2018-06-17 NOTE — Patient Instructions (Addendum)
Eat yogurt and/or take a probiotic each day that you are taking the Augmentin (Amoxicillin)  Cool Mist Vaporizer A cool mist vaporizer is a device that releases a cool mist into the air. If you have a cough or a cold, using a vaporizer may help relieve your symptoms. The mist adds moisture to the air, which may help thin your mucus and make it less sticky. When your mucus is thin and less sticky, it easier for you to breathe and to cough up secretions. Do not use a vaporizer if you are allergic to mold. Follow these instructions at home:  Follow the instructions that come with the vaporizer.  Do not use anything other than distilled water in the vaporizer.  Do not run the vaporizer all of the time. Doing that can cause mold or bacteria to grow in the vaporizer.  Clean the vaporizer after each time that you use it.  Clean and dry the vaporizer well before storing it.  Stop using the vaporizer if your breathing symptoms get worse. This information is not intended to replace advice given to you by your health care provider. Make sure you discuss any questions you have with your health care provider. Document Released: 01/03/2004 Document Revised: 10/26/2015 Document Reviewed: 07/07/2015 Elsevier Interactive Patient Education  2019 [employer].  Otitis Media, Adult  Otitis media means that the middle ear is red and swollen (inflamed) and full of fluid. The condition usually goes away on its own. Follow these instructions at home:  Take over-the-counter and prescription medicines only as told by your doctor.  If you were prescribed an antibiotic medicine, take it as told by your doctor. Do not stop taking the antibiotic even if you start to feel better.  Keep all follow-up visits as told by your doctor. This is important. Contact a doctor if:  You have bleeding from your nose.  There is a lump on your neck.  You are not getting better in 5 days.  You feel worse instead of  better. Get help right away if:  You have pain that is not helped with medicine.  You have swelling, redness, or pain around your ear.  You get a stiff neck.  You cannot move part of your face (paralyzed).  You notice that the bone behind your ear hurts when you touch it.  You get a very bad headache. Summary  Otitis media means that the middle ear is red, swollen, and full of fluid.  This condition usually goes away on its own. In some cases, treatment may be needed.  If you were prescribed an antibiotic medicine, take it as told by your doctor. This information is not intended to replace advice given to you by your health care provider. Make sure you discuss any questions you have with your health care provider. Document Released: 09/24/2007 Document Revised: 04/28/2016 Document Reviewed: 04/28/2016 Elsevier Interactive Patient Education  2019 [employer].

## 2018-06-17 NOTE — Progress Notes (Signed)
Name: Regina Christensen   MRN: [id number]    DOB: [birth year]   Date:06/17/2018       Progress Note  Subjective  Chief Complaint  Chief Complaint  Patient presents with  . URI    cough, congested, runny nose  . Otalgia    HPI  Pt presents with 5 day history of nasal congestion, fatigue, body aches, bilateral otalgia, slight cough, mild headache, some fevers/chills.  No chest pain, shortness, nausea/vomiting, or palpitations.  She states that overall her course is worse today that she has been feeling this week.  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  She does have prediabetes at 5.8% for last A1C, and HTN.  She has been taking coricidin without relief.  Has not been using her nasocort, but is using saline.   Patient Active Problem List   Diagnosis Date Noted  . Bilateral carpal tunnel syndrome 07/30/2016  . OSA (obstructive sleep apnea) 03/07/2016  . Depression, major, recurrent, mild (HCC) 11/30/2015  . Osteoarthritis of both knees 11/30/2015  . BPPV (benign paroxysmal positional vertigo) 08/28/2015  . Essential hypertension 11/09/2014  . Hyperglycemia 11/09/2014  . GAD (generalized anxiety disorder) 11/09/2014  . Acanthosis nigricans 11/09/2014  . Morbid obesity (HCC) 01/06/2012    Social History   Tobacco Use  . Smoking status: Never Smoker  . Smokeless tobacco: Never Used  Substance Use Topics  . Alcohol use: No    Alcohol/week: 0.0 standard drinks    Comment: rare     Current Outpatient Medications:  .  acetaminophen (TYLENOL 8 HOUR ARTHRITIS PAIN) 650 MG CR tablet, Take 1 tablet (650 mg total) by mouth every 8 (eight) hours as needed for pain. (Patient taking differently: Take 1,300 mg by mouth every 8 (eight) hours as needed for pain. ), Disp: 90 tablet, Rfl: 0 .  albuterol (PROVENTIL HFA;VENTOLIN HFA) 108 (90 Base) MCG/ACT inhaler, TAKE 2 PUFFS BY MOUTH EVERY 6 HOURS AS NEEDED FOR WHEEZE OR  SHORTNESS OF BREATH, Disp: 8.5 Inhaler, Rfl: 0 .  ALPRAZolam (XANAX) 0.5 MG tablet, Take 1 tablet (0.5 mg total) by mouth daily as needed., Disp: 30 tablet, Rfl: 0 .  amLODipine-olmesartan (AZOR) 5-40 MG tablet, TAKE 1 TABLET BY MOUTH EVERY DAY, Disp: 90 tablet, Rfl: 1 .  Armodafinil 150 MG tablet, Take 1 tablet (150 mg total) by mouth daily., Disp: 30 tablet, Rfl: 2 .  aspirin EC 81 MG tablet, Take 81 mg by mouth daily., Disp: , Rfl:  .  cloNIDine (CATAPRES - DOSED IN MG/24 HR) 0.1 mg/24hr patch, PLACE 1 PATCH (0.1 MG TOTAL) ONTO THE SKIN ONCE A WEEK., Disp: 12 patch, Rfl: 1 .  fexofenadine (ALLEGRA) 180 MG tablet, Take 180 mg by mouth daily., Disp: , Rfl:  .  nebivolol (BYSTOLIC) 10 MG tablet, Take 1 tablet (10 mg total) by mouth daily., Disp: 90 tablet, Rfl: 1 .  omeprazole (PRILOSEC) 40 MG capsule, Take 1 capsule (40 mg total) by mouth daily., Disp: 30 capsule, Rfl: 3 .  Semaglutide, 1 MG/DOSE, (OZEMPIC, 1 MG/DOSE,) 2 MG/1.5ML SOPN, Inject 1 mg into the skin once a week., Disp: 9 mL, Rfl: 1 .  triamcinolone (NASACORT) 55 MCG/ACT AERO nasal inhaler, Place 2 sprays into the nose daily., Disp: , Rfl:  .  Triamcinolone Acetonide (TRIAMCINOLONE 0.1 % CREAM : EUCERIN) CREA, Apply 1 application topically 2 (two) times daily., Disp: 1 each, Rfl: 0 .  venlafaxine XR (EFFEXOR-XR)  150 MG 24 hr capsule, Take 1 capsule (150 mg total) by mouth daily., Disp: 90 capsule, Rfl: 0 .  XIIDRA 5 % SOLN, Place 1 drop into both eyes 2 (two) times daily., Disp: , Rfl: 4 .  amoxicillin-clavulanate (AUGMENTIN) 875-125 MG tablet, Take 1 tablet by mouth 2 (two) times daily for 10 days., Disp: 20 tablet, Rfl: 0 .  azelastine (ASTELIN) 0.1 % nasal spray, Place 2 sprays into both nostrils 2 (two) times daily. Use in each nostril as directed, Disp: 30 mL, Rfl: 0 .  guaiFENesin (MUCINEX) 600 MG 12 hr tablet, Take 1 tablet (600 mg total) by mouth 2 (two) times daily., Disp: 20 tablet, Rfl: 0  Allergies  Allergen Reactions  .  Ace Inhibitors Cough  . Hctz [Hydrochlorothiazide] Rash    face    I personally reviewed active problem list, medication list, allergies, notes from last encounter, lab results with the patient/caregiver today.  ROS  Ten systems reviewed and is negative except as mentioned in HPI.  Objective  Vitals:   06/17/18 1059  BP: 132/84  Pulse: 76  Resp: 16  Temp: 99 F (37.2 C)  TempSrc: Oral  SpO2: 99%  Weight: 280 lb (127 kg)  Height: 5\' 8"  (1.727 m)   Body mass index is 42.57 kg/m.  Nursing Note and Vital Signs reviewed.  Physical Exam  Constitutional: Patient appears well-developed and well-nourished. Obese. No distress.  HEENT: head atraumatic, normocephalic, pupils equal and reactive to light, RIGHT TM with mild effusion but no erythema, LEFT TM with moderate effusion, purulent fluid behind TM, area surrounding TM is erythematous.  Bilateral maxillary and frontal sinuses are non-tender, neck supple with mild submandibular lymphadenopathy, throat within normal limits - no erythema or exudate, no tonsillar swelling Cardiovascular: Normal rate, regular rhythm and normal heart sounds.  No murmur heard. No BLE edema. Pulmonary/Chest: Effort normal and LUL with very mild inspiratory wheeze, breath sounds otherwise clear. No respiratory distress. Psychiatric: Patient has a normal mood and affect. behavior is normal. Judgment and thought content normal.  No results found for this or any previous visit (from the past 72 hour(s)).  Assessment & Plan  1. Non-recurrent acute suppurative otitis media of left ear without spontaneous rupture of tympanic membrane - guaiFENesin (MUCINEX) 600 MG 12 hr tablet; Take 1 tablet (600 mg total) by mouth 2 (two) times daily.  Dispense: 20 tablet; Refill: 0 - azelastine (ASTELIN) 0.1 % nasal spray; Place 2 sprays into both nostrils 2 (two) times daily. Use in each nostril as directed  Dispense: 30 mL; Refill: 0 - amoxicillin-clavulanate (AUGMENTIN)  875-125 MG tablet; Take 1 tablet by mouth 2 (two) times daily for 10 days.  Dispense: 20 tablet; Refill: 0  2. Sinus congestion - guaiFENesin (MUCINEX) 600 MG 12 hr tablet; Take 1 tablet (600 mg total) by mouth 2 (two) times daily.  Dispense: 20 tablet; Refill: 0 - azelastine (ASTELIN) 0.1 % nasal spray; Place 2 sprays into both nostrils 2 (two) times daily. Use in each nostril as directed  Dispense: 30 mL; Refill: 0  -Red flags and when to present for emergency care or RTC including fever >101.19F, chest pain, shortness of breath, new/worsening/un-resolving symptoms, reviewed with patient at time of visit. Follow up and care instructions discussed and provided in AVS.

## 2018-06-21 ENCOUNTER — Encounter: Payer: Self-pay | Admitting: Nurse Practitioner

## 2018-06-21 ENCOUNTER — Ambulatory Visit: Payer: BC Managed Care – PPO | Admitting: Nurse Practitioner

## 2018-06-21 VITALS — BP 124/78 | HR 88 | Temp 98.3°F | Resp 16 | Ht 68.0 in | Wt 277.6 lb

## 2018-06-21 DIAGNOSIS — R0982 Postnasal drip: Secondary | ICD-10-CM | POA: Diagnosis not present

## 2018-06-21 DIAGNOSIS — R059 Cough, unspecified: Secondary | ICD-10-CM

## 2018-06-21 DIAGNOSIS — R0981 Nasal congestion: Secondary | ICD-10-CM | POA: Diagnosis not present

## 2018-06-21 DIAGNOSIS — R05 Cough: Secondary | ICD-10-CM | POA: Diagnosis not present

## 2018-06-21 MED ORDER — BENZONATATE 200 MG PO CAPS: 200.0000 mg | ORAL_CAPSULE | Freq: Three times a day (TID) | ORAL | 0 refills | Status: DC | PRN

## 2018-06-21 NOTE — Progress Notes (Signed)
Name: Regina Christensen   MRN: [id number]    DOB: 11/27/1955   Date:06/21/2018       Progress Note  Subjective  Chief Complaint  Chief Complaint  Patient presents with  . Follow-up    patient stated that she is not feeling any better. her chest hurts from coughing so much and she now gets dizzy afterwards.    HPI  patient was seen on 2/27 in office and diagnosed with left otitis media and sinus congestion. She was rx augmentin 10 day course and mucinex and astelin nasal spray. States she is still taking antibiotic but notes has started to get strong cough- occasional productive- thick yellow sputum. Taking musinex as prescribed. Cough is so strong it is achy chest. When she bends down really quick and gets up she gets a little lightheaded. She takes generic allegra for allergies    Patient Active Problem List   Diagnosis Date Noted  . Bilateral carpal tunnel syndrome 07/30/2016  . OSA (obstructive sleep apnea) 03/07/2016  . Depression, major, recurrent, mild (HCC) 11/30/2015  . Osteoarthritis of both knees 11/30/2015  . BPPV (benign paroxysmal positional vertigo) 08/28/2015  . Essential hypertension 11/09/2014  . Hyperglycemia 11/09/2014  . GAD (generalized anxiety disorder) 11/09/2014  . Acanthosis nigricans 11/09/2014  . Morbid obesity (HCC) 01/06/2012    Past Medical History:  Diagnosis Date  . Arthritis   . Depression   . Hypertension   . Morbid obesity with BMI of 45.0-49.9, adult (HCC)   . Obesity   . Sleep apnea     Past Surgical History:  Procedure Laterality Date  . ABDOMINAL HYSTERECTOMY    . BREAST EXCISIONAL BIOPSY Left   . ENDOMETRIAL ABLATION     x2  . GASTRIC BYPASS     sleeve    Social History   Tobacco Use  . Smoking status: Never Smoker  . Smokeless tobacco: Never Used  Substance Use Topics  . Alcohol use: No    Alcohol/week: 0.0 standard drinks    Comment: rare     Current Outpatient Medications:  .  acetaminophen (TYLENOL 8 HOUR  ARTHRITIS PAIN) 650 MG CR tablet, Take 1 tablet (650 mg total) by mouth every 8 (eight) hours as needed for pain. (Patient taking differently: Take 1,300 mg by mouth every 8 (eight) hours as needed for pain. ), Disp: 90 tablet, Rfl: 0 .  albuterol (PROVENTIL HFA;VENTOLIN HFA) 108 (90 Base) MCG/ACT inhaler, TAKE 2 PUFFS BY MOUTH EVERY 6 HOURS AS NEEDED FOR WHEEZE OR SHORTNESS OF BREATH, Disp: 8.5 Inhaler, Rfl: 0 .  ALPRAZolam (XANAX) 0.5 MG tablet, Take 1 tablet (0.5 mg total) by mouth daily as needed., Disp: 30 tablet, Rfl: 0 .  amLODipine-olmesartan (AZOR) 5-40 MG tablet, TAKE 1 TABLET BY MOUTH EVERY DAY, Disp: 90 tablet, Rfl: 1 .  amoxicillin-clavulanate (AUGMENTIN) 875-125 MG tablet, Take 1 tablet by mouth 2 (two) times daily for 10 days., Disp: 20 tablet, Rfl: 0 .  Armodafinil 150 MG tablet, Take 1 tablet (150 mg total) by mouth daily., Disp: 30 tablet, Rfl: 2 .  aspirin EC 81 MG tablet, Take 81 mg by mouth daily., Disp: , Rfl:  .  azelastine (ASTELIN) 0.1 % nasal spray, Place 2 sprays into both nostrils 2 (two) times daily. Use in each nostril as directed, Disp: 30 mL, Rfl: 0 .  cloNIDine (CATAPRES - DOSED IN MG/24 HR) 0.1 mg/24hr patch, PLACE 1 PATCH (0.1 MG TOTAL) ONTO THE SKIN ONCE A WEEK., Disp: 12 patch, Rfl:  1 .  fexofenadine (ALLEGRA) 180 MG tablet, Take 180 mg by mouth daily., Disp: , Rfl:  .  guaiFENesin (MUCINEX) 600 MG 12 hr tablet, Take 1 tablet (600 mg total) by mouth 2 (two) times daily., Disp: 20 tablet, Rfl: 0 .  nebivolol (BYSTOLIC) 10 MG tablet, Take 1 tablet (10 mg total) by mouth daily., Disp: 90 tablet, Rfl: 1 .  omeprazole (PRILOSEC) 40 MG capsule, Take 1 capsule (40 mg total) by mouth daily., Disp: 30 capsule, Rfl: 3 .  Semaglutide, 1 MG/DOSE, (OZEMPIC, 1 MG/DOSE,) 2 MG/1.5ML SOPN, Inject 1 mg into the skin once a week., Disp: 9 mL, Rfl: 1 .  triamcinolone (NASACORT) 55 MCG/ACT AERO nasal inhaler, Place 2 sprays into the nose daily., Disp: , Rfl:  .  Triamcinolone Acetonide  (TRIAMCINOLONE 0.1 % CREAM : EUCERIN) CREA, Apply 1 application topically 2 (two) times daily., Disp: 1 each, Rfl: 0 .  venlafaxine XR (EFFEXOR-XR) 150 MG 24 hr capsule, Take 1 capsule (150 mg total) by mouth daily., Disp: 90 capsule, Rfl: 0 .  XIIDRA 5 % SOLN, Place 1 drop into both eyes 2 (two) times daily., Disp: , Rfl: 4  Allergies  Allergen Reactions  . Ace Inhibitors Cough  . Hctz [Hydrochlorothiazide] Rash    face    ROS   No other specific complaints in a complete review of systems (except as listed in HPI above).  Objective  Vitals:   06/21/18 0953  BP: 124/78  Pulse: 88  Resp: 16  Temp: 98.3 F (36.8 C)  TempSrc: Oral  Weight: 277 lb 9.6 oz (125.9 kg)  Height: 5\' 8"  (1.727 m)    Body mass index is 42.21 kg/m.  Nursing Note and Vital Signs reviewed.  Physical Exam HENT:     Head: Normocephalic and atraumatic.     Right Ear: Hearing, ear canal and external ear normal. A middle ear effusion is present.     Left Ear: Hearing, ear canal and external ear normal. A middle ear effusion is present.     Nose: Nose normal.     Right Sinus: No maxillary sinus tenderness or frontal sinus tenderness.     Left Sinus: No maxillary sinus tenderness or frontal sinus tenderness.     Mouth/Throat:     Mouth: Mucous membranes are moist.     Pharynx: Uvula midline. No oropharyngeal exudate or posterior oropharyngeal erythema.  Eyes:     General:        Right eye: No discharge.        Left eye: No discharge.     Conjunctiva/sclera: Conjunctivae normal.  Neck:     Musculoskeletal: Normal range of motion.  Cardiovascular:     Rate and Rhythm: Normal rate.  Pulmonary:     Effort: Pulmonary effort is normal.     Breath sounds: Normal breath sounds.  Lymphadenopathy:     Cervical: No cervical adenopathy.  Skin:    General: Skin is warm and dry.     Findings: No rash.  Neurological:     Mental Status: She is alert.  Psychiatric:        Judgment: Judgment normal.       No results found for this or any previous visit (from the past 48 hour(s)).  Assessment & Plan  1. Cough Continue mucinex.  - benzonatate (TESSALON) 200 MG capsule; Take 1 capsule (200 mg total) by mouth 3 (three) times daily as needed for cough.  Dispense: 30 capsule; Refill: 0  2. PND (  post-nasal drip) See avs  3. Sinus congestion - Continue antibiotic till you complete the course - Continue taking mucinex 600-1200mg  Twice a day - Take fexofenadine 180mg  in the morning and Levocetirizine 2.5mg  OR Loratadine 5mg  at night for no more than 2 weeks.  - Continue with nasal spray, can add on neti put or saline rinse - Use humidifier- steam to loosen up secretions.  - Do not take more than 3,000 mg of acetaminophen in 24 hours.  - If you are not having improvement in 5-7 days or worsening at any time please call or message me to let me know and we can trial a prednisone taper.       -Red flags and when to present for emergency care or RTC including fever >101.18F, chest pain, shortness of breath, new/worsening/un-resolving symptoms, reviewed with patient at time of visit. Follow up and care instructions discussed and provided in AVS.

## 2018-06-21 NOTE — Patient Instructions (Addendum)
- Continue antibiotic till you complete the course - Continue taking mucinex 600-1200mg  Twice a day - Take fexofenadine 180mg  in the morning and Levocetirizine 2.5mg  OR Loratadine 5mg  at night for no more than 2 weeks.  - Continue with nasal spray, can add on neti put or saline rinse - Use humidifier- steam to loosen up secretions.  - Do not take more than 3,000 mg of acetaminophen in 24 hours.  - If you are not having improvement in 5-7 days or worsening at any time please call or message me to let me know and we can trial a prednisone taper.      Eustachian Tube Dysfunction  Eustachian tube dysfunction refers to a condition in which a blockage develops in the narrow passage that connects the middle ear to the back of the nose (eustachian tube). The eustachian tube regulates air pressure in the middle ear by letting air move between the ear and nose. It also helps to drain fluid from the middle ear space. Eustachian tube dysfunction can affect one or both ears. When the eustachian tube does not function properly, air pressure, fluid, or both can build up in the middle ear. What are the causes? This condition occurs when the eustachian tube becomes blocked or cannot open normally. Common causes of this condition include:  Ear infections.  Colds and other infections that affect the nose, mouth, and throat (upper respiratory tract).  Allergies.  Irritation from cigarette smoke.  Irritation from stomach acid coming up into the esophagus (gastroesophageal reflux). The esophagus is the tube that carries food from the mouth to the stomach.  Sudden changes in air pressure, such as from descending in an airplane or scuba diving.  Abnormal growths in the nose or throat, such as: ? Growths that line the nose (nasal polyps). ? Abnormal growth of cells (tumors). ? Enlarged tissue at the back of the throat (adenoids). What increases the risk? You are more likely to develop this condition  if:  You smoke.  You are overweight.  You are a child who has: ? Certain birth defects of the mouth, such as cleft palate. ? Large tonsils or adenoids. What are the signs or symptoms? Common symptoms of this condition include:  A feeling of fullness in the ear.  Ear pain.  Clicking or popping noises in the ear.  Ringing in the ear.  Hearing loss.  Loss of balance.  Dizziness. Symptoms may get worse when the air pressure around you changes, such as when you travel to an area of high elevation, fly on an airplane, or go scuba diving. How is this diagnosed? This condition may be diagnosed based on:  Your symptoms.  A physical exam of your ears, nose, and throat.  Tests, such as those that measure: ? The movement of your eardrum (tympanogram). ? Your hearing (audiometry). How is this treated? Treatment depends on the cause and severity of your condition.  In mild cases, you may relieve your symptoms by moving air into your ears. This is called "popping the ears."  In more severe cases, or if you have symptoms of fluid in your ears, treatment may include: ? Medicines to relieve congestion (decongestants). ? Medicines that treat allergies (antihistamines). ? Nasal sprays or ear drops that contain medicines that reduce swelling (steroids). ? A procedure to drain the fluid in your eardrum (myringotomy). In this procedure, a small tube is placed in the eardrum to:  Drain the fluid.  Restore the air in the middle ear  space. ? A procedure to insert a balloon device through the nose to inflate the opening of the eustachian tube (balloon dilation). Follow these instructions at home: Lifestyle  Do not do any of the following until your health care provider approves: ? Travel to high altitudes. ? Fly in airplanes. ? Work in a Estate agent or room. ? Scuba dive.  Do not use any products that contain nicotine or tobacco, such as cigarettes and e-cigarettes. If you need  help quitting, ask your health care provider.  Keep your ears dry. Wear fitted earplugs during showering and bathing. Dry your ears completely after. General instructions  Take over-the-counter and prescription medicines only as told by your health care provider.  Use techniques to help pop your ears as recommended by your health care provider. These may include: ? Chewing gum. ? Yawning. ? Frequent, forceful swallowing. ? Closing your mouth, holding your nose closed, and gently blowing as if you are trying to blow air out of your nose.  Keep all follow-up visits as told by your health care provider. This is important. Contact a health care provider if:  Your symptoms do not go away after treatment.  Your symptoms come back after treatment.  You are unable to pop your ears.  You have: ? A fever. ? Pain in your ear. ? Pain in your head or neck. ? Fluid draining from your ear.  Your hearing suddenly changes.  You become very dizzy.  You lose your balance. Summary  Eustachian tube dysfunction refers to a condition in which a blockage develops in the eustachian tube.  It can be caused by ear infections, allergies, inhaled irritants, or abnormal growths in the nose or throat.  Symptoms include ear pain, hearing loss, or ringing in the ears.  Mild cases are treated with maneuvers to unblock the ears, such as yawning or ear popping.  Severe cases are treated with medicines. Surgery may also be done (rare). This information is not intended to replace advice given to you by your health care provider. Make sure you discuss any questions you have with your health care provider. Document Released: 05/04/2015 Document Revised: 07/28/2017 Document Reviewed: 07/28/2017 Elsevier Interactive Patient Education  2019 [employer].

## 2018-07-08 ENCOUNTER — Other Ambulatory Visit: Payer: Self-pay | Admitting: Family Medicine

## 2018-07-08 DIAGNOSIS — R0981 Nasal congestion: Secondary | ICD-10-CM

## 2018-07-08 DIAGNOSIS — H66002 Acute suppurative otitis media without spontaneous rupture of ear drum, left ear: Secondary | ICD-10-CM

## 2018-07-24 ENCOUNTER — Other Ambulatory Visit: Payer: Self-pay | Admitting: Family Medicine

## 2018-07-24 DIAGNOSIS — R0981 Nasal congestion: Secondary | ICD-10-CM

## 2018-07-24 DIAGNOSIS — H66002 Acute suppurative otitis media without spontaneous rupture of ear drum, left ear: Secondary | ICD-10-CM

## 2018-08-17 ENCOUNTER — Ambulatory Visit (INDEPENDENT_AMBULATORY_CARE_PROVIDER_SITE_OTHER): Payer: BC Managed Care – PPO | Admitting: Emergency Medicine

## 2018-08-17 ENCOUNTER — Other Ambulatory Visit: Payer: Self-pay

## 2018-08-17 DIAGNOSIS — Z23 Encounter for immunization: Secondary | ICD-10-CM

## 2018-08-22 ENCOUNTER — Other Ambulatory Visit: Payer: Self-pay | Admitting: Family Medicine

## 2018-08-22 DIAGNOSIS — I10 Essential (primary) hypertension: Secondary | ICD-10-CM

## 2018-09-07 ENCOUNTER — Other Ambulatory Visit: Payer: Self-pay | Admitting: Family Medicine

## 2018-09-07 DIAGNOSIS — F411 Generalized anxiety disorder: Secondary | ICD-10-CM

## 2018-09-07 DIAGNOSIS — F330 Major depressive disorder, recurrent, mild: Secondary | ICD-10-CM

## 2018-09-07 NOTE — Telephone Encounter (Signed)
Refill request for general medication. Effexor   Last office visit 06/11/2018  Follow up on 09/10/2018

## 2018-09-10 ENCOUNTER — Ambulatory Visit (INDEPENDENT_AMBULATORY_CARE_PROVIDER_SITE_OTHER): Payer: BC Managed Care – PPO | Admitting: Family Medicine

## 2018-09-10 ENCOUNTER — Encounter: Payer: Self-pay | Admitting: Family Medicine

## 2018-09-10 ENCOUNTER — Other Ambulatory Visit: Payer: Self-pay

## 2018-09-10 DIAGNOSIS — G4733 Obstructive sleep apnea (adult) (pediatric): Secondary | ICD-10-CM

## 2018-09-10 DIAGNOSIS — F411 Generalized anxiety disorder: Secondary | ICD-10-CM | POA: Diagnosis not present

## 2018-09-10 DIAGNOSIS — I10 Essential (primary) hypertension: Secondary | ICD-10-CM

## 2018-09-10 DIAGNOSIS — L308 Other specified dermatitis: Secondary | ICD-10-CM | POA: Diagnosis not present

## 2018-09-10 DIAGNOSIS — M170 Bilateral primary osteoarthritis of knee: Secondary | ICD-10-CM

## 2018-09-10 DIAGNOSIS — E8881 Metabolic syndrome: Secondary | ICD-10-CM

## 2018-09-10 DIAGNOSIS — K219 Gastro-esophageal reflux disease without esophagitis: Secondary | ICD-10-CM

## 2018-09-10 DIAGNOSIS — F330 Major depressive disorder, recurrent, mild: Secondary | ICD-10-CM

## 2018-09-10 MED ORDER — TRIAMCINOLONE 0.1 % CREAM:EUCERIN CREAM 1:1: 1.0000 "application " | TOPICAL_CREAM | Freq: Two times a day (BID) | CUTANEOUS | 0 refills | Status: DC

## 2018-09-10 NOTE — Progress Notes (Signed)
Name: Regina Christensen   MRN: [id number]    DOB: 07/25/1955   Date:09/10/2018       Progress Note  Subjective  Chief Complaint  Chief Complaint  Patient presents with  . Medication Refill    3 month F/U  . Hypertension    Edema in bilateral feet and ankles  . Sleep Apnea    Doing well with CPAP machine and sleeping on average 4 to 4 and half hours a night   . Morbid Obesity  . Depression  . Osteoarthritis    Left Knee has been bothering her quite a bit    I connected with  [name]  on 09/10/18 at  2:40 PM EDT by a video enabled telemedicine application and verified that I am speaking with the correct person using two identifiers.  I discussed the limitations of evaluation and management by telemedicine and the availability of in person appointments. The patient expressed understanding and agreed to proceed. Staff also discussed with the patient that there may be a patient responsible charge related to this service. Patient Location: at home  Provider Location: [facility]   HPI  HTN: she was Valsartan  and hydralazine, off HCTZ because of hypokalemia and it was causing facial irritation.She is onClonidine,AzorandBystolic 01/2017 and she is doing well , no side effects of medication and bphas been at goal. She has lower extremity edema and is wearing compression stocking hoses. BP not checked today since it was a virtual visit   Morbid Obesity:she had bariatric surgery about 5 years ago, original weight prior to surgery was around 313 lbs, she lost 50 lbs but gradually gained it back. Shewason Victoza for pre-diabetes and weight loss, but it stopped working, we gave herOzempicOct 2018 but did not lose weight, since Dec 2018 she is on Ozempic 1 mg daily, she lost 10 lbs in 3 months but ran out of medication prior to last visit and it was gaining some of the weight back. Discussed a Therapeutic carbohydrate restrictive diet    Acanthosis nigricans/Metabolic syndrome: she denies polyphagia, polyuria or polydipsia, last hgbA1C wasdown from 6.0% to 5.8% she started on Victoza Fall 2017and switched to [facility] Fall 2018and tolerating medication well.Last hgbA1C 5.8%, we will recheck level yearly   OSA: shehas seen dentist and is wearing a mouth piece, she states the mouth piecebut recent note from dentist states did not reverse symptoms , she had a sleep study 03/2018 and is now on CPAP 8 cm H2O  wearing machine for 4-4.5 hours per night and is feeling better. Fatigue, and  morning headaches have improved, she still has mental fogginess  DepressionMild: she is taking Effexor for many years,phq 9was 6 today , she is retirement age but cannot afford it and is feeling trapped right now. She has friends that have retired and gets frustrated. Son was sick recently and got upset, takes alprazolam prn anxiety. Unchanged   OA: off Meloxicam, taking Tylenol and seems to help with knee pain, pain is described as aching worse with activity and with cold weather. She will contact [facility] for another steroid injection   GERD: she states that symptoms have been present  for over 9 months, she states she has a burning sensation on her throat, clearing her throat.No regurgitation, no epigastric pain. She denies change in bowel movement. Doing better since she changed from regular coffee to decaffeinated. Avoiding spicy food . She states still has symptoms multiple times a week we will  place referral to GI today   Patient Active Problem List   Diagnosis Date Noted  . Bilateral carpal tunnel syndrome 07/30/2016  . OSA (obstructive sleep apnea) 03/07/2016  . Depression, major, recurrent, mild (HCC) 11/30/2015  . Osteoarthritis of both knees 11/30/2015  . BPPV (benign paroxysmal positional vertigo) 08/28/2015  . Essential hypertension 11/09/2014  . Hyperglycemia 11/09/2014  . GAD (generalized anxiety disorder) 11/09/2014   . Acanthosis nigricans 11/09/2014  . Morbid obesity (HCC) 01/06/2012    Past Surgical History:  Procedure Laterality Date  . ABDOMINAL HYSTERECTOMY    . BREAST EXCISIONAL BIOPSY Left   . ENDOMETRIAL ABLATION     x2  . GASTRIC BYPASS     sleeve    Family History  Problem Relation Age of Onset  . Heart disease Father   . Cancer Brother     Social History   Socioeconomic History  . Marital status: Divorced    Spouse name: Not on file  . Number of children: 2  . Years of education: Not on file  . Highest education level: High school graduate  Occupational History  . Occupation: patient Tax adviser: [employer]  Social Needs  . Financial resource strain: Not hard at all  . Food insecurity:    Worry: Never true    Inability: Never true  . Transportation needs:    Medical: No    Non-medical: No  Tobacco Use  . Smoking status: Never Smoker  . Smokeless tobacco: Never Used  Substance and Sexual Activity  . Alcohol use: No    Alcohol/week: 0.0 standard drinks    Comment: rare  . Drug use: No  . Sexual activity: Never  Lifestyle  . Physical activity:    Days per week: 0 days    Minutes per session: 0 min  . Stress: Rather much  Relationships  . Social connections:    Talks on phone: More than three times a week    Gets together: More than three times a week    Attends religious service: More than 4 times per year    Active member of club or organization: No    Attends meetings of clubs or organizations: Never    Relationship status: Divorced  . Intimate partner violence:    Fear of current or ex partner: No    Emotionally abused: No    Physically abused: No    Forced sexual activity: No  Other Topics Concern  . 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  acetaminophen (TYLENOL  8 HOUR ARTHRITIS PAIN) 650 MG CR tablet, Take 1 tablet (650 mg total) by mouth every 8 (eight) hours as needed for pain. (Patient taking differently: Take 1,300 mg by mouth every 8 (eight) hours as needed for pain. ), Disp: 90 tablet, Rfl: 0 .  albuterol (PROVENTIL HFA;VENTOLIN HFA) 108 (90 Base) MCG/ACT inhaler, TAKE 2 PUFFS BY MOUTH EVERY 6 HOURS AS NEEDED FOR WHEEZE OR SHORTNESS OF BREATH, Disp: 8.5 Inhaler, Rfl: 0 .  ALPRAZolam (XANAX) 0.5 MG tablet, Take 1 tablet (0.5 mg total) by mouth daily as needed., Disp: 30 tablet, Rfl: 0 .  amLODipine-olmesartan (AZOR) 5-40 MG tablet, TAKE 1 TABLET BY MOUTH EVERY DAY, Disp: 90 tablet, Rfl: 1 .  Armodafinil 150  MG tablet, Take 1 tablet (150 mg total) by mouth daily., Disp: 30 tablet, Rfl: 2 .  aspirin EC 81 MG tablet, Take 81 mg by mouth daily., Disp: , Rfl:  .  cloNIDine (CATAPRES - DOSED IN MG/24 HR) 0.1 mg/24hr patch, PLACE 1 PATCH (0.1 MG TOTAL) ONTO THE SKIN ONCE A WEEK. (Patient taking differently: PLACE 1 PATCH (0.1 MG TOTAL) ONTO THE SKIN ONCE A WEEK.), Disp: 12 patch, Rfl: 1 .  fexofenadine (ALLEGRA) 180 MG tablet, Take 180 mg by mouth daily., Disp: , Rfl:  .  nebivolol (BYSTOLIC) 10 MG tablet, Take 1 tablet (10 mg total) by mouth daily., Disp: 90 tablet, Rfl: 1 .  omeprazole (PRILOSEC) 40 MG capsule, Take 1 capsule (40 mg total) by mouth daily., Disp: 30 capsule, Rfl: 3 .  Semaglutide, 1 MG/DOSE, (OZEMPIC, 1 MG/DOSE,) 2 MG/1.5ML SOPN, Inject 1 mg into the skin once a week., Disp: 9 mL, Rfl: 1 .  triamcinolone (NASACORT) 55 MCG/ACT AERO nasal inhaler, Place 2 sprays into the nose daily., Disp: , Rfl:  .  Triamcinolone Acetonide (TRIAMCINOLONE 0.1 % CREAM : EUCERIN) CREA, Apply 1 application topically 2 (two) times daily., Disp: 1 each, Rfl: 0 .  venlafaxine XR (EFFEXOR-XR) 150 MG 24 hr capsule, TAKE 1 CAPSULE BY MOUTH EVERY DAY, Disp: 90 capsule, Rfl: 0 .  XIIDRA 5 % SOLN, Place 1 drop into both eyes 2 (two) times daily., Disp: , Rfl: 4 .   azelastine (ASTELIN) 0.1 % nasal spray, PLACE 2 SPRAYS INTO BOTH NOSTRILS 2 (TWO) TIMES DAILY (Patient not taking: Reported on 09/10/2018), Disp: 30 mL, Rfl: 1  Allergies  Allergen Reactions  . Ace Inhibitors Cough  . Hctz [Hydrochlorothiazide] Rash    face    I personally reviewed active problem list, medication list, allergies, family history, social history with the patient/caregiver today.   ROS  Ten systems reviewed and is negative except as mentioned in HPI   Objective  Virtual encounter, vitals not obtained.  There is no height or weight on file to calculate BMI.  Physical Exam  Awake, alert and oriented   PHQ2/9: [facility] 09/10/2018 06/21/2018 06/17/2018 06/11/2018 04/26/2018  Decreased Interest 0 1 1 1  0  Down, Depressed, Hopeless 1 1 1 1  0  PHQ - 2 Score 1 2 2 2  0  Altered sleeping 1 1 1 1  -  Tired, decreased energy 1 1 1 1  -  Change in appetite 0 1 1 1  -  Feeling bad or failure about yourself  0 1 1 2  -  Trouble concentrating 2 1 1 1  -  Moving slowly or fidgety/restless 1 1 1  0 -  Suicidal thoughts 0 0 0 - -  PHQ-9 Score 6 8 8 8  -  Difficult doing work/chores Somewhat difficult Somewhat difficult Somewhat difficult Somewhat difficult -  Some recent data might be hidden   PHQ-2/9 Result is positive.    Fall Risk: Fall Risk  09/10/2018 06/21/2018 06/17/2018 06/11/2018 04/26/2018  Falls in the past year? 0 0 0 0 0  Number falls in past yr: 0 0 0 0 0  Injury with Fall? 0 0 0 0 0  Risk for fall due to : - - History of fall(s) - -  Follow up - - Falls evaluation completed - -     Assessment & Plan   1. Morbid obesity (HCC)  Discussed with the patient the risk posed by an increased BMI. Discussed importance of portion control, calorie counting and at  least 150 minutes of physical activity weekly. Avoid sweet beverages and drink more water. Eat at least 6 servings of fruit and vegetables daily   2. Other eczema  - Triamcinolone Acetonide  (TRIAMCINOLONE 0.1 % CREAM : EUCERIN) CREA; Apply 1 application topically 2 (two) times daily.  Dispense: 1 each; Refill: 0  3. GAD (generalized anxiety disorder)  Advised to take alprazolam only prn   4. OSA (obstructive sleep apnea)  Continue CPAP and take modanafil daly instead of prn  5. Metabolic syndrome   6. Depression, major, recurrent, mild (HCC)  Seems to be stable, continue medication   7. Essential hypertension   8. GERD without esophagitis  - Ambulatory referral to Gastroenterology  9. Primary osteoarthritis of both knees  She will follow up with [facility]   I discussed the assessment and treatment plan with the patient. The patient was provided an opportunity to ask questions and all were answered. The patient agreed with the plan and demonstrated an understanding of the instructions.  The patient was advised to call back or seek an in-person evaluation if the symptoms worsen or if the condition fails to improve as anticipated.  I provided 25 minutes of non-face-to-face time during this encounter.

## 2018-09-15 ENCOUNTER — Other Ambulatory Visit: Payer: Self-pay | Admitting: Family Medicine

## 2018-09-15 ENCOUNTER — Telehealth: Payer: Self-pay | Admitting: Family Medicine

## 2018-09-15 DIAGNOSIS — K219 Gastro-esophageal reflux disease without esophagitis: Secondary | ICD-10-CM

## 2018-09-15 NOTE — Telephone Encounter (Signed)
Pt states Sowles was supposed to send in eczema cream to [facility] and it is not there as of this morning.Please advise.

## 2018-09-15 NOTE — Telephone Encounter (Signed)
Prescription was printed instead of faxed electronically. Called in prescription to [facility].

## 2018-09-23 ENCOUNTER — Other Ambulatory Visit: Payer: Self-pay

## 2018-09-23 ENCOUNTER — Ambulatory Visit: Payer: BC Managed Care – PPO | Admitting: Gastroenterology

## 2018-09-23 ENCOUNTER — Encounter: Payer: Self-pay | Admitting: Gastroenterology

## 2018-09-23 VITALS — BP 138/81 | HR 87 | Temp 98.2°F | Ht 68.0 in | Wt 284.6 lb

## 2018-09-23 DIAGNOSIS — K219 Gastro-esophageal reflux disease without esophagitis: Secondary | ICD-10-CM | POA: Diagnosis not present

## 2018-09-23 MED ORDER — FAMOTIDINE 20 MG PO TABS: 20.0000 mg | ORAL_TABLET | Freq: Every day | ORAL | 0 refills | Status: DC

## 2018-09-23 NOTE — Progress Notes (Addendum)
Regina Christensen [address]  [address]  [place], [place] [address]  Main: [phone]  Fax: [phone]   Gastroenterology Consultation  Referring Provider:     [name], MD Primary Care Physician:  [name], MD Reason for Consultation:     GERD        HPI:    Chief Complaint  Patient presents with  . New Patient (Initial Visit)  . Gastroesophageal Reflux    Regina Christensen is a 63 y.o. y/o female referred for consultation & management  by [name], Danna Hefty, MD.  Patient reports 3 to 36-month history of heartburn and globus sensation.  Patient has been taking omeprazole 40 mg once daily.  This has relieved her heartburn but she continues to have globus sensation.  States globus sensation is better.  She still has it on a daily basis.  No dysphagia. The patient denies abdominal or flank pain, anorexia, nausea or vomiting, dysphagia, change in bowel habits or black or bloody stools or weight loss.  Reports history of gastric bypass about 7 years ago for weight loss.  States had a colonoscopy within the last 10 years and it was normal and she was told her next one should be in 10 years.  States [name] has this report.  We do not have access to these records.   Past Medical History:  Diagnosis Date  . Arthritis   . Depression   . Hypertension   . Morbid obesity with BMI of 45.0-49.9, adult (HCC)   . Obesity   . Sleep apnea     Past Surgical History:  Procedure Laterality Date  . ABDOMINAL HYSTERECTOMY    . BREAST EXCISIONAL BIOPSY Left   . ENDOMETRIAL ABLATION     x2  . GASTRIC BYPASS     sleeve    Prior to Admission medications   Medication Sig Start Date End Date Taking? Authorizing Provider  acetaminophen (TYLENOL 8 HOUR ARTHRITIS PAIN) 650 MG CR tablet Take 1 tablet (650 mg total) by mouth every 8 (eight) hours as needed for pain. Patient taking differently: Take 1,300 mg by mouth every 8 (eight) hours as needed for pain.   03/07/16  Yes Sowles, Danna Hefty, MD  albuterol (PROVENTIL HFA;VENTOLIN HFA) 108 (90 Base) MCG/ACT inhaler TAKE 2 PUFFS BY MOUTH EVERY 6 HOURS AS NEEDED FOR WHEEZE OR SHORTNESS OF BREATH 08/12/17  Yes Sowles, Danna Hefty, MD  ALPRAZolam Prudy Feeler) 0.5 MG tablet Take 1 tablet (0.5 mg total) by mouth daily as needed. 07/10/17  Yes Sowles, Danna Hefty, MD  amLODipine-olmesartan (AZOR) 5-40 MG tablet TAKE 1 TABLET BY MOUTH EVERY DAY 05/02/18  Yes Sowles, Danna Hefty, MD  Armodafinil 150 MG tablet Take 1 tablet (150 mg total) by mouth daily. 06/11/18  Yes [name], MD  aspirin EC 81 MG tablet Take 81 mg by mouth daily.   Yes [provider]  cloNIDine (CATAPRES - DOSED IN MG/24 HR) 0.1 mg/24hr patch PLACE 1 PATCH (0.1 MG TOTAL) ONTO THE SKIN ONCE A WEEK. Patient taking differently: PLACE 1 PATCH (0.1 MG TOTAL) ONTO THE SKIN ONCE A WEEK. 04/28/18  Yes Sowles, Danna Hefty, MD  fexofenadine (ALLEGRA) 180 MG tablet Take 180 mg by mouth daily.   Yes [provider]  nebivolol (BYSTOLIC) 10 MG tablet Take 1 tablet (10 mg total) by mouth daily. 06/11/18  Yes Sowles, Danna Hefty, MD  omeprazole (PRILOSEC) 40 MG capsule TAKE 1 CAPSULE BY MOUTH EVERY DAY 09/15/18  Yes [name], MD  Semaglutide, 1 MG/DOSE, (  OZEMPIC, 1 MG/DOSE,) 2 MG/1.5ML SOPN Inject 1 mg into the skin once a week. 06/11/18  Yes Sowles, Danna Hefty, MD  triamcinolone (NASACORT) 55 MCG/ACT AERO nasal inhaler Place 2 sprays into the nose daily.   Yes [provider]  Triamcinolone Acetonide (TRIAMCINOLONE 0.1 % CREAM : EUCERIN) CREA Apply 1 application topically 2 (two) times daily. 09/10/18  Yes Sowles, Danna Hefty, MD  venlafaxine XR (EFFEXOR-XR) 150 MG 24 hr capsule TAKE 1 CAPSULE BY MOUTH EVERY DAY 09/07/18  Yes Sowles, Danna Hefty, MD  XIIDRA 5 % SOLN Place 1 drop into both eyes 2 (two) times daily. 02/03/17  Yes [provider]  azelastine (ASTELIN) 0.1 % nasal spray PLACE 2 SPRAYS INTO BOTH NOSTRILS 2 (TWO) TIMES DAILY Patient not taking:  Reported on 09/23/2018 07/26/18   Doren Custard, FNP  triamcinolone cream (KENALOG) 0.1 % APPLY TO AFFECTRED AREA TWICE A DAYT 09/15/18   [provider]    Family History  Problem Relation Age of Onset  . Heart disease Father   . Cancer Brother      Social History   Tobacco Use  . Smoking status: Never Smoker  . Smokeless tobacco: Never Used  Substance Use Topics  . Alcohol use: No    Alcohol/week: 0.0 standard drinks    Comment: rare  . Drug use: No    Allergies as of 09/23/2018 - Review Complete 09/23/2018  Allergen Reaction Noted  . Ace inhibitors Cough 11/30/2015  . Hctz [hydrochlorothiazide] Rash 06/20/2016    Review of Systems:    All systems reviewed and negative except where noted in HPI.   Physical Exam:  BP 138/81   Pulse 87   Temp 98.2 F (36.8 C) (Oral)   Ht 5\' 8"  (1.727 m)   Wt 284 lb 9.6 oz (129.1 kg)   BMI 43.27 kg/m  No LMP recorded. Patient has had a hysterectomy. Psych:  Alert and cooperative. Normal mood and affect. General:   Alert,  Well-developed, well-nourished, pleasant and cooperative in NAD Head:  Normocephalic and atraumatic. Eyes:  Sclera clear, no icterus.   Conjunctiva pink. Ears:  Normal auditory acuity. Nose:  No deformity, discharge, or lesions. Mouth:  No deformity or lesions,oropharynx pink & moist. Neck:  Supple; no masses or thyromegaly. Abdomen:  Normal bowel sounds.  No bruits.  Soft, non-tender and non-distended without masses, hepatosplenomegaly or hernias noted.  No guarding or rebound tenderness.    Msk:  Symmetrical without gross deformities. Good, equal movement & strength bilaterally. Pulses:  Normal pulses noted. Extremities:  No clubbing or edema.  No cyanosis. Neurologic:  Alert and oriented x3;  grossly normal neurologically. Skin:  Intact without significant lesions or rashes. No jaundice. Lymph Nodes:  No significant cervical adenopathy. Psych:  Alert and cooperative. Normal mood and affect.   Labs:  CBC    Component Value Date/Time   WBC 9.6 02/05/2018 1532   RBC 4.64 02/05/2018 1532   HGB 13.4 02/05/2018 1532   HGB 14.4 07/10/2012 2133   HCT 39.3 02/05/2018 1532   HCT 43.4 07/10/2012 2133   PLT 324 02/05/2018 1532   PLT 268 07/10/2012 2133   MCV 84.7 02/05/2018 1532   MCV 89 07/10/2012 2133   MCH 28.9 02/05/2018 1532   MCHC 34.1 02/05/2018 1532   RDW 14.4 02/05/2018 1532   RDW 15.0 (H) 07/10/2012 2133   LYMPHSABS 2,112 02/05/2018 1532   MONOABS 0.7 08/18/2015 1514   EOSABS 192 02/05/2018 1532   BASOSABS 58 02/05/2018 1532  CMP     Component Value Date/Time   NA 141 02/05/2018 1532   NA 142 12/05/2014 1117   NA 140 07/10/2012 2133   K 3.5 02/05/2018 1532   K 3.5 07/10/2012 2133   CL 104 02/05/2018 1532   CL 104 07/10/2012 2133   CO2 27 02/05/2018 1532   CO2 29 07/10/2012 2133   GLUCOSE 81 02/05/2018 1532   GLUCOSE 142 (H) 07/10/2012 2133   BUN 12 02/05/2018 1532   BUN 12 12/05/2014 1117   BUN 16 07/10/2012 2133   CREATININE 0.69 02/05/2018 1532   CALCIUM 9.0 02/05/2018 1532   CALCIUM 9.1 07/10/2012 2133   PROT 6.9 02/05/2018 1532   PROT 6.8 12/05/2014 1117   PROT 8.0 07/10/2012 2133   ALBUMIN 3.7 12/14/2015 0822   ALBUMIN 4.2 12/05/2014 1117   ALBUMIN 3.7 07/10/2012 2133   AST 13 02/05/2018 1532   AST 25 07/10/2012 2133   ALT 11 02/05/2018 1532   ALT 21 07/10/2012 2133   ALKPHOS 74 12/14/2015 0822   ALKPHOS 111 07/10/2012 2133   BILITOT 0.3 02/05/2018 1532   BILITOT 0.3 12/05/2014 1117   BILITOT 0.3 07/10/2012 2133   GFRNONAA 94 02/05/2018 1532   GFRAA 109 02/05/2018 1532    Imaging Studies: No results found.  Assessment and Plan:   Regina Christensen is a [age] y.o. y/o female has been referred for GERD and globus sensation  Given ongoing symptoms despite omeprazole 40 mg once daily we discussed the option of proceeding with upper endoscopy to evaluate for gastric inlet patch, rule out esophagitis etc.  Next  Alternative of continued  conservative management was also discussed.  Patient would like to continue with medication therapy at this time and consider upper endoscopy after 3 to 4 months.  (Risks of PPI use were discussed with patient including bone loss, C. Diff diarrhea, pneumonia, infections, CKD, electrolyte abnormalities.  Pt. Verbalizes understanding and chooses to continue the medication.)  She asked about adding other medications to the omeprazole to help with her symptoms.  We discussed that increasing omeprazole to twice daily would increase the risks of PPI use as discussed above.  Therefore we discussed using Pepcid at bedtime instead.  Patient would like this prescribed and I have sent it to the pharmacy.  If symptoms do not improved she was asked to contact us and she verbalized understanding.  Patient educated extensively on acid reflux lifestyle modification, including buying a bed wedge, not eating 3 hrs before bedtime, diet modifications, and handout given for the same.    Follow-up in 3 months   Dr Regina BouillonVarnita Tahiliani  Speech recognition software was used to dictate the above note.

## 2018-10-03 ENCOUNTER — Other Ambulatory Visit: Payer: Self-pay | Admitting: Family Medicine

## 2018-10-03 DIAGNOSIS — G4733 Obstructive sleep apnea (adult) (pediatric): Secondary | ICD-10-CM

## 2018-12-03 ENCOUNTER — Other Ambulatory Visit: Payer: Self-pay | Admitting: Family Medicine

## 2018-12-03 DIAGNOSIS — F411 Generalized anxiety disorder: Secondary | ICD-10-CM

## 2018-12-03 DIAGNOSIS — F330 Major depressive disorder, recurrent, mild: Secondary | ICD-10-CM

## 2018-12-10 ENCOUNTER — Other Ambulatory Visit: Payer: Self-pay | Admitting: Family Medicine

## 2018-12-10 DIAGNOSIS — F411 Generalized anxiety disorder: Secondary | ICD-10-CM

## 2018-12-10 NOTE — Telephone Encounter (Signed)
Pt needs refill on Alprazolam to be sent to [place] has an appt for 12/24/2018

## 2018-12-11 NOTE — Telephone Encounter (Signed)
Requested medication (s) are due for refill today: yes  Requested medication (s) are on the active medication list: yes  Last refill: 09/15/18  Future visit scheduled: no  Notes to clinic: historical provider   Requested Prescriptions  Pending Prescriptions Disp Refills   triamcinolone cream (KENALOG) 0.1 % [Pharmacy Med Name: TRIAMCINOLONE 0.1% CREAM] 80 g 0    Sig: APPLY TO AFFECTRED AREA TWICE A DAYT     Dermatology:  Corticosteroids Passed - 12/10/2018  8:34 PM      Passed - Valid encounter within last 12 months    Recent Outpatient Visits          3 months ago Morbid obesity [facility])   [facility] Steele Sizer, MD   5 months ago Cough   [place], NP   5 months ago Non-recurrent acute suppurative otitis media of left ear without spontaneous rupture of tympanic membrane   [place], FNP   6 months ago Morbid obesity [facility])   [facility] Steele Sizer, MD   7 months ago Sore throat   [place], NP      Future Appointments            In 1 week Steele Sizer, MD [facility], [place]   In 2 weeks Virgel Manifold, MD [place]

## 2018-12-12 MED ORDER — ALPRAZOLAM 0.5 MG PO TABS: 0.5000 mg | ORAL_TABLET | Freq: Every day | ORAL | 0 refills | Status: DC | PRN

## 2018-12-17 ENCOUNTER — Other Ambulatory Visit: Payer: Self-pay | Admitting: Family Medicine

## 2018-12-17 ENCOUNTER — Other Ambulatory Visit: Payer: Self-pay | Admitting: Gastroenterology

## 2018-12-17 DIAGNOSIS — Z1231 Encounter for screening mammogram for malignant neoplasm of breast: Secondary | ICD-10-CM

## 2018-12-17 NOTE — Telephone Encounter (Signed)
Last office visit 09/23/18 GERD  Last refill 09/23/18 0 refills  Has appointment 12/29/18

## 2018-12-24 ENCOUNTER — Other Ambulatory Visit: Payer: Self-pay

## 2018-12-24 ENCOUNTER — Encounter: Payer: Self-pay | Admitting: Family Medicine

## 2018-12-24 ENCOUNTER — Ambulatory Visit: Payer: BC Managed Care – PPO | Admitting: Family Medicine

## 2018-12-24 VITALS — BP 146/86 | HR 89 | Temp 96.9°F | Resp 16 | Ht 68.0 in | Wt 282.6 lb

## 2018-12-24 DIAGNOSIS — I10 Essential (primary) hypertension: Secondary | ICD-10-CM

## 2018-12-24 DIAGNOSIS — K219 Gastro-esophageal reflux disease without esophagitis: Secondary | ICD-10-CM

## 2018-12-24 DIAGNOSIS — R7303 Prediabetes: Secondary | ICD-10-CM

## 2018-12-24 DIAGNOSIS — Z23 Encounter for immunization: Secondary | ICD-10-CM | POA: Diagnosis not present

## 2018-12-24 DIAGNOSIS — G4733 Obstructive sleep apnea (adult) (pediatric): Secondary | ICD-10-CM

## 2018-12-24 DIAGNOSIS — E8881 Metabolic syndrome: Secondary | ICD-10-CM

## 2018-12-24 DIAGNOSIS — F321 Major depressive disorder, single episode, moderate: Secondary | ICD-10-CM | POA: Diagnosis not present

## 2018-12-24 DIAGNOSIS — M170 Bilateral primary osteoarthritis of knee: Secondary | ICD-10-CM

## 2018-12-24 DIAGNOSIS — F411 Generalized anxiety disorder: Secondary | ICD-10-CM | POA: Diagnosis not present

## 2018-12-24 MED ORDER — ARMODAFINIL 150 MG PO TABS: 150.0000 mg | ORAL_TABLET | Freq: Every day | ORAL | 2 refills | Status: DC

## 2018-12-24 MED ORDER — NEBIVOLOL HCL 10 MG PO TABS: 10.0000 mg | ORAL_TABLET | Freq: Every day | ORAL | 1 refills | Status: DC

## 2018-12-24 MED ORDER — OZEMPIC (1 MG/DOSE) 2 MG/1.5ML ~~LOC~~ SOPN: 1.0000 mg | PEN_INJECTOR | SUBCUTANEOUS | 1 refills | Status: DC

## 2018-12-24 MED ORDER — AMLODIPINE-OLMESARTAN 5-40 MG PO TABS: 1.0000 | ORAL_TABLET | Freq: Every day | ORAL | 1 refills | Status: DC

## 2018-12-24 NOTE — Progress Notes (Signed)
Name: Regina Christensen   MRN: [id number]    DOB: 04/18/1956   Date:12/24/2018       Progress Note  Subjective  Chief Complaint  Chief Complaint  Patient presents with  . Medication Refill  . Hypertension    A little bit of swelling, headaches (might be stress)  . Morbid Obesity  . Sleep Apnea    Fair sleeping-Sleeping on average 4 to 6 hours nightly with CPAP  . Depression  . Osteoarthritis    HPI    HTN: She is onClonidine,AzorandBystolic 01/2017 and she is doing well , no side effects of medication and bpwas at goal until today, however she has been more stressed lately. . She has lower extremity edema and is wearing compression stocking hoses prn.   Morbid Obesity:she had bariatric surgery about 5 years ago, original weight prior to surgery was around 313 lbs, she lost 50 lbs but gradually gained it back. Shewason Victoza for pre-diabetes and weight loss, but it stopped working, we gave herOzempicOct 2018 but did not lose weight, since Dec 2018 she is on Ozempic 1 mg daily, she lost 10 lbs in 3 months however she has been stressed eating recently and is gaining weight back.   Acanthosis nigricans/Metabolic syndrome: she denies polyphagia, polyuria or polydipsia, last hgbA1C wasdown from 6.0% to 5.8% she started on [facility] Fall 2017and [facility]and tolerating medication well.Last hgbA1C 5.8%, reminded her of healthier snacks.   OSA: shehas seen dentist and is wearing a mouth piece, she states the mouth piecebut recent note from dentist states did not reverse symptoms , she had a sleep study 03/2018 and is now on CPAP 8 cm H2O  wearing machine for 4-4.5 hours per night and is feeling better. 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 She states since working remotely she has noticed difficulty keeping up with the pace, she was down to 20 per day but up to 35-40. She has difficulty being focused, and recently having panic attacks because she may lose her job and not be able to retire. She feels torn between losing her job now but also not sure if she can afford to retire. Taking Effexor, we gave a refill of alprazolam however taking almost daily now and advised it is to use seldom, she agrees on seeing psychiatrist now.   OA:off Meloxicam, taking Tylenol and seems to help with knee pain, pain is described as aching worse with activity and with cold weather. She has been more sedentary since COVID-19  GERD: she has seen GI and is doing better now    Patient Active Problem List   Diagnosis Date Noted  . Bilateral carpal tunnel syndrome 07/30/2016  . OSA (obstructive sleep apnea) 03/07/2016  . Depression, major, recurrent, mild (HCC) 11/30/2015  . Osteoarthritis of both knees 11/30/2015  . BPPV (benign paroxysmal positional vertigo) 08/28/2015  . Essential hypertension 11/09/2014  . Hyperglycemia 11/09/2014  . GAD (generalized anxiety disorder) 11/09/2014  . Acanthosis nigricans 11/09/2014  . Morbid obesity (HCC) 01/06/2012    Past Surgical History:  Procedure Laterality Date  . ABDOMINAL HYSTERECTOMY    . BREAST EXCISIONAL BIOPSY Left   . ENDOMETRIAL ABLATION     x2  . GASTRIC BYPASS     sleeve    Family History  Problem Relation Age of Onset  . Heart disease Father   . Cancer Brother     Social History   Socioeconomic History  . Marital status: Divorced    Spouse name: Not on file  . Number of children: 2  . Years of education: Not on file  . Highest education level: High school graduate  Occupational  History  . Occupation: patient Camera operator: [employer]  Social Needs  . Financial resource strain: Not hard at all  . Food insecurity    Worry: Never true    Inability: Never true  . Transportation needs    Medical: No    Non-medical: No  Tobacco Use  . Smoking status: Never Smoker  . Smokeless tobacco: Never Used  Substance and Sexual Activity  . Alcohol use: No    Alcohol/week: 0.0 standard drinks    Comment: rare  . Drug use: No  . Sexual activity: Never  Lifestyle  . Physical activity    Days per week: 0 days    Minutes per session: 0 min  . Stress: Rather much  Relationships  . Social connections    Talks on phone: More than three times a week    Gets together: More than three times a week    Attends religious service: More than 4 times per year    Active member of club or organization: No    Attends meetings of clubs or organizations: Never    Relationship status: Divorced  . Intimate partner violence    Fear of current or ex partner: No    Emotionally abused: No    Physically abused: No    Forced sexual activity: No  Other Topics Concern  . 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  acetaminophen (TYLENOL 8 HOUR ARTHRITIS PAIN) 650 MG CR tablet, Take 1 tablet (650 mg total) by mouth every 8 (eight) hours as needed for pain. (Patient taking differently: Take 1,300 mg by mouth every 8 (eight) hours as needed for pain. ), Disp: 90 tablet, Rfl: 0 .  albuterol (PROVENTIL HFA;VENTOLIN HFA) 108 (90 Base) MCG/ACT inhaler, TAKE 2 PUFFS BY MOUTH EVERY 6 HOURS AS NEEDED FOR WHEEZE OR SHORTNESS OF BREATH, Disp: 8.5 Inhaler, Rfl: 0 .  ALPRAZolam (XANAX) 0.5 MG tablet, Take 1 tablet (0.5 mg total) by mouth daily as needed., Disp: 30 tablet, Rfl: 0 .  amLODipine-olmesartan (AZOR) 5-40 MG tablet,  TAKE 1 TABLET BY MOUTH EVERY DAY, Disp: 90 tablet, Rfl: 1 .  Armodafinil 150 MG tablet, TAKE 1 TABLET BY MOUTH EVERY DAY, Disp: 30 tablet, Rfl: 2 .  aspirin EC 81 MG tablet, Take 81 mg by mouth daily., Disp: , Rfl:  .  azelastine (ASTELIN) 0.1 % nasal spray, PLACE 2 SPRAYS INTO BOTH NOSTRILS 2 (TWO) TIMES DAILY, Disp: 30 mL, Rfl: 1 .  cloNIDine (CATAPRES - DOSED IN MG/24 HR) 0.1 mg/24hr patch, PLACE 1 PATCH (0.1 MG TOTAL) ONTO THE SKIN ONCE A WEEK. (Patient taking differently: PLACE 1 PATCH (0.1 MG TOTAL)  ONTO THE SKIN ONCE A WEEK.), Disp: 12 patch, Rfl: 1 .  famotidine (PEPCID) 20 MG tablet, TAKE 1 TABLET BY MOUTH EVERYDAY AT BEDTIME, Disp: 90 tablet, Rfl: 0 .  fexofenadine (ALLEGRA) 180 MG tablet, Take 180 mg by mouth daily., Disp: , Rfl:  .  nebivolol (BYSTOLIC) 10 MG tablet, Take 1 tablet (10 mg total) by mouth daily., Disp: 90 tablet, Rfl: 1 .  omeprazole (PRILOSEC) 40 MG capsule, TAKE 1 CAPSULE BY MOUTH EVERY DAY, Disp: 90 capsule, Rfl: 1 .  Semaglutide, 1 MG/DOSE, (OZEMPIC, 1 MG/DOSE,) 2 MG/1.5ML SOPN, Inject 1 mg into the skin once a week., Disp: 9 mL, Rfl: 1 .  triamcinolone (NASACORT) 55 MCG/ACT AERO nasal inhaler, Place 2 sprays into the nose daily., Disp: , Rfl:  .  Triamcinolone Acetonide (TRIAMCINOLONE 0.1 % CREAM : EUCERIN) CREA, Apply 1 application topically 2 (two) times daily., Disp: 1 each, Rfl: 0 .  triamcinolone cream (KENALOG) 0.1 %, APPLY TO AFFECTRED AREA TWICE A DAYT, Disp: 80 g, Rfl: 0 .  venlafaxine XR (EFFEXOR-XR) 150 MG 24 hr capsule, TAKE 1 CAPSULE BY MOUTH EVERY DAY, Disp: 90 capsule, Rfl: 0 .  XIIDRA 5 % SOLN, Place 1 drop into both eyes 2 (two) times daily., Disp: , Rfl: 4  Allergies  Allergen Reactions  . Ace Inhibitors Cough  . Hctz [Hydrochlorothiazide] Rash    face    I personally reviewed active problem list, medication list, allergies, family history, social history, health maintenance with the patient/caregiver today.   ROS  Constitutional: Negative  for fever or weight change.  Respiratory: Negative for cough and shortness of breath.   Cardiovascular: Negative for chest pain or palpitations.  Gastrointestinal: Negative for abdominal pain, no bowel changes.  Musculoskeletal: Negative for gait problem or joint swelling.  Skin: Negative for rash.  Neurological: Negative for dizziness or headache.  No other specific complaints in a complete review of systems (except as listed in HPI above).  Objective  Vitals:   12/24/18 1113  BP: (!) 146/86  Pulse: 89  Resp: 16  Temp: (!) 96.9 F (36.1 C)  TempSrc: Temporal  SpO2: 98%  Weight: 282 lb 9.6 oz (128.2 kg)  Height: 5\' 8"  (1.727 m)    Body mass index is 42.97 kg/m.  Physical Exam  Constitutional: Patient appears well-developed and well-nourished. Obese No distress.  HEENT: head atraumatic, normocephalic, pupils equal and reactive to light Cardiovascular: Normal rate, regular rhythm and normal heart sounds.  No murmur heard. Trace  BLE edema. Pulmonary/Chest: Effort normal and breath sounds normal. No respiratory distress. Abdominal: Soft.  There is no tenderness. Psychiatric: Patient has a normal mood and affect. behavior is normal. Judgment and thought content normal.  PHQ2/9: Depression screen [facility] 2/9 12/24/2018 09/10/2018 06/21/2018 06/17/2018 06/11/2018  Decreased Interest 1 0 1 1 1   Down, Depressed, Hopeless 1 1 1 1 1   PHQ - 2 Score 2 1 2 2 2   Altered sleeping 1 1 1 1 1   Tired, decreased energy 1 1 1 1 1   Change in appetite 2 0 1 1 1   Feeling bad or failure about yourself  2 0 1 1 2   Trouble concentrating 2 2 1 1 1   Moving slowly or fidgety/restless 0 1 1 1  0  Suicidal thoughts - 0 0 0 -  PHQ-9 Score 10 6 8 8 8   Difficult doing work/chores Somewhat difficult Somewhat difficult Somewhat difficult Somewhat difficult Somewhat difficult  Some recent data might be hidden    phq  9 is positive   Fall Risk: Fall Risk  12/24/2018 09/10/2018 06/21/2018 06/17/2018 06/11/2018  Falls  in the past year? 0 0 0 0 0  Number falls in past yr: 0 0 0 0 0  Injury with Fall? 0 0 0 0 0  Risk for fall due to : - - - History of fall(s) -  Follow up - - - Falls evaluation completed -    Functional Status Survey: Is the patient deaf or have difficulty hearing?: No Does the patient have difficulty seeing, even when wearing glasses/contacts?: No Does the patient have difficulty concentrating, remembering, or making decisions?: No Does the patient have difficulty walking or climbing stairs?: No Does the patient have difficulty dressing or bathing?: No Does the patient have difficulty doing errands alone such as visiting a doctor's office or shopping?: No    Assessment & Plan  1. Moderate major depression (HCC)  - Ambulatory referral to Psychiatry It seems to be related to work stress, we will give her an excuse until evaluated by a psychiatrist, for now I can fill a short term disability form   2. Needs flu shot  - Flu Vaccine QUAD 6+ mos PF IM (Fluarix Quad PF)  3. Morbid obesity (HCC)  Discussed with the patient the risk posed by an increased BMI. Discussed importance of portion control, calorie counting and at least 150 minutes of physical activity weekly. Avoid sweet beverages and drink more water. Eat at least 6 servings of fruit and vegetables daily   4. GAD (generalized anxiety disorder)  - Ambulatory referral to Psychiatry  5. Metabolic syndrome  - Semaglutide, 1 MG/DOSE, (OZEMPIC, 1 MG/DOSE,) 2 MG/1.5ML SOPN; Inject 1 mg into the skin once a week.  Dispense: 9 mL; Refill: 1  6. OSA (obstructive sleep apnea)  - Armodafinil 150 MG tablet; Take 1 tablet (150 mg total) by mouth daily.  Dispense: 30 tablet; Refill: 2  7. Essential hypertension  - nebivolol (BYSTOLIC) 10 MG tablet; Take 1 tablet (10 mg total) by mouth daily.  Dispense: 90 tablet; Refill: 1 - amLODipine-olmesartan (AZOR) 5-40 MG tablet; Take 1 tablet by mouth daily.  Dispense: 90 tablet; Refill:  1  8. GERD without esophagitis  Keep follow up with GI   9. Prediabetes  Discussed healthy diet  10. Primary osteoarthritis of both knees

## 2018-12-29 ENCOUNTER — Encounter: Payer: Self-pay | Admitting: Gastroenterology

## 2018-12-29 ENCOUNTER — Other Ambulatory Visit: Payer: Self-pay

## 2018-12-29 ENCOUNTER — Ambulatory Visit: Payer: BC Managed Care – PPO | Admitting: Gastroenterology

## 2018-12-29 VITALS — BP 135/87 | HR 75 | Temp 97.7°F | Wt 282.5 lb

## 2018-12-29 DIAGNOSIS — K219 Gastro-esophageal reflux disease without esophagitis: Secondary | ICD-10-CM | POA: Diagnosis not present

## 2018-12-29 MED ORDER — FAMOTIDINE 20 MG PO TABS: 20.0000 mg | ORAL_TABLET | Freq: Every day | ORAL | 0 refills | Status: DC

## 2018-12-29 NOTE — Progress Notes (Signed)
Melodie BouillonVarnita , MD [address]  [address]  [place], [place] [id number]  Main: [phone]  Fax: [phone]   Primary Care Physician: Alba CorySowles, Krichna, MD   Chief Complaint  Patient presents with  . Gastroesophageal Reflux    Patient states that her symptoms are imprved she has cut down on the caffeine drinks     HPI: Regina Christensen is a 63 y.o. female here for follow-up of GERD.  Patient reports improvement in symptoms are decreasing caffeine intake.  Taking Meprazole in the morning and Pepcid at bedtime.  Reports symptoms are much better compared to her last visit.  Is not having any breakthrough symptoms.  No dysphagia.  No abdominal pain.  No nausea or vomiting.  Altered bowel habits.  No blood in stool.  No family history of colon cancer.  History of gastric bypass about 7 years ago for weight loss  Last colonoscopy 2016 with Dr. Servando SnareWohl, with internal hemorrhoids reported.  Repeat recommended in 10 years  Current Outpatient Medications  Medication Sig Dispense Refill  . acetaminophen (TYLENOL 8 HOUR ARTHRITIS PAIN) 650 MG CR tablet Take 1 tablet (650 mg total) by mouth every 8 (eight) hours as needed for pain. (Patient taking differently: Take 1,300 mg by mouth every 8 (eight) hours as needed for pain. ) 90 tablet 0  . albuterol (PROVENTIL HFA;VENTOLIN HFA) 108 (90 Base) MCG/ACT inhaler TAKE 2 PUFFS BY MOUTH EVERY 6 HOURS AS NEEDED FOR WHEEZE OR SHORTNESS OF BREATH 8.5 Inhaler 0  . ALPRAZolam (XANAX) 0.5 MG tablet Take 1 tablet (0.5 mg total) by mouth daily as needed. 30 tablet 0  . amLODipine-olmesartan (AZOR) 5-40 MG tablet Take 1 tablet by mouth daily. 90 tablet 1  . Armodafinil 150 MG tablet Take 1 tablet (150 mg total) by mouth daily. 30 tablet 2  . aspirin EC 81 MG tablet Take 81 mg by mouth daily.    Marland Kitchen. azelastine (ASTELIN) 0.1 % nasal spray PLACE 2 SPRAYS INTO BOTH NOSTRILS 2 (TWO) TIMES DAILY 30 mL 1  . cloNIDine (CATAPRES - DOSED IN MG/24 HR) 0.1 mg/24hr  patch PLACE 1 PATCH (0.1 MG TOTAL) ONTO THE SKIN ONCE A WEEK. (Patient taking differently: PLACE 1 PATCH (0.1 MG TOTAL) ONTO THE SKIN ONCE A WEEK.) 12 patch 1  . fexofenadine (ALLEGRA) 180 MG tablet Take 180 mg by mouth daily.    . nebivolol (BYSTOLIC) 10 MG tablet Take 1 tablet (10 mg total) by mouth daily. 90 tablet 1  . Semaglutide, 1 MG/DOSE, (OZEMPIC, 1 MG/DOSE,) 2 MG/1.5ML SOPN Inject 1 mg into the skin once a week. 9 mL 1  . triamcinolone (NASACORT) 55 MCG/ACT AERO nasal inhaler Place 2 sprays into the nose daily.    . Triamcinolone Acetonide (TRIAMCINOLONE 0.1 % CREAM : EUCERIN) CREA Apply 1 application topically 2 (two) times daily. 1 each 0  . triamcinolone cream (KENALOG) 0.1 % APPLY TO AFFECTRED AREA TWICE A DAYT 80 g 0  . venlafaxine XR (EFFEXOR-XR) 150 MG 24 hr capsule TAKE 1 CAPSULE BY MOUTH EVERY DAY 90 capsule 0  . XIIDRA 5 % SOLN Place 1 drop into both eyes 2 (two) times daily.  4  . famotidine (PEPCID) 20 MG tablet Take 1 tablet (20 mg total) by mouth daily. 90 tablet 0   No current facility-administered medications for this visit.     Allergies as of 12/29/2018 - Review Complete 12/29/2018  Allergen Reaction Noted  . Ace inhibitors Cough 11/30/2015  . Hctz [hydrochlorothiazide]  Rash 06/20/2016    ROS:  General: Negative for anorexia, weight loss, fever, chills, fatigue, weakness. ENT: Negative for hoarseness, difficulty swallowing , nasal congestion. CV: Negative for chest pain, angina, palpitations, dyspnea on exertion, peripheral edema.  Respiratory: Negative for dyspnea at rest, dyspnea on exertion, cough, sputum, wheezing.  GI: See history of present illness. GU:  Negative for dysuria, hematuria, urinary incontinence, urinary frequency, nocturnal urination.  Endo: Negative for unusual weight change.    Physical Examination:   BP 135/87 (BP Location: Left Arm, Patient Position: Sitting, Cuff Size: Normal)   Pulse 75   Temp 97.7 F (36.5 C) (Oral)   Wt 282 lb  8 oz (128.1 kg)   BMI 42.95 kg/m   General: Well-nourished, well-developed in no acute distress.  Eyes: No icterus. Conjunctivae pink. Mouth: Oropharyngeal mucosa moist and pink , no lesions erythema or exudate. Neck: Supple, Trachea midline Abdomen: Bowel sounds are normal, nontender, nondistended, no hepatosplenomegaly or masses, no abdominal bruits or hernia , no rebound or guarding.   Extremities: No lower extremity edema. No clubbing or deformities. Neuro: Alert and oriented x 3.  Grossly intact. Skin: Warm and dry, no jaundice.   Psych: Alert and cooperative, normal mood and affect.   Labs: CMP     Component Value Date/Time   NA 141 02/05/2018 1532   NA 142 12/05/2014 1117   NA 140 07/10/2012 2133   K 3.5 02/05/2018 1532   K 3.5 07/10/2012 2133   CL 104 02/05/2018 1532   CL 104 07/10/2012 2133   CO2 27 02/05/2018 1532   CO2 29 07/10/2012 2133   GLUCOSE 81 02/05/2018 1532   GLUCOSE 142 (H) 07/10/2012 2133   BUN 12 02/05/2018 1532   BUN 12 12/05/2014 1117   BUN 16 07/10/2012 2133   CREATININE 0.69 02/05/2018 1532   CALCIUM 9.0 02/05/2018 1532   CALCIUM 9.1 07/10/2012 2133   PROT 6.9 02/05/2018 1532   PROT 6.8 12/05/2014 1117   PROT 8.0 07/10/2012 2133   ALBUMIN 3.7 12/14/2015 0822   ALBUMIN 4.2 12/05/2014 1117   ALBUMIN 3.7 07/10/2012 2133   AST 13 02/05/2018 1532   AST 25 07/10/2012 2133   ALT 11 02/05/2018 1532   ALT 21 07/10/2012 2133   ALKPHOS 74 12/14/2015 0822   ALKPHOS 111 07/10/2012 2133   BILITOT 0.3 02/05/2018 1532   BILITOT 0.3 12/05/2014 1117   BILITOT 0.3 07/10/2012 2133   GFRNONAA 94 02/05/2018 1532   GFRAA 109 02/05/2018 1532   Lab Results  Component Value Date   WBC 9.6 02/05/2018   HGB 13.4 02/05/2018   HCT 39.3 02/05/2018   MCV 84.7 02/05/2018   PLT 324 02/05/2018    Imaging Studies: No results found.  Assessment and Plan:   Regina Christensen is a 63 y.o. y/o female here for follow-up of GERD  Symptoms well controlled with  lifestyle modifications  Patient willing to come off PPI  Discontinue omeprazole  Start taking Pepcid in the morning.  If symptoms worsen she was asked to contact us and we can increase Pepcid to twice a day  Patient educated extensively on acid reflux lifestyle modification, including buying a bed wedge, not eating 3 hrs before bedtime, diet modifications, and handout given for the same.   Follow-up 6 months  Dr Vonda Antigua

## 2018-12-29 NOTE — Patient Instructions (Signed)

## 2019-01-03 ENCOUNTER — Telehealth: Payer: Self-pay | Admitting: Family Medicine

## 2019-01-03 NOTE — Telephone Encounter (Signed)
Pt is checking status on referral to [facility]. Please advise. Patient asking if she calls or do they call her about appt

## 2019-01-15 ENCOUNTER — Other Ambulatory Visit: Payer: Self-pay | Admitting: Family Medicine

## 2019-01-15 DIAGNOSIS — I10 Essential (primary) hypertension: Secondary | ICD-10-CM

## 2019-01-21 ENCOUNTER — Encounter: Payer: Self-pay | Admitting: Family Medicine

## 2019-01-21 ENCOUNTER — Ambulatory Visit: Payer: BC Managed Care – PPO | Admitting: Family Medicine

## 2019-01-21 ENCOUNTER — Other Ambulatory Visit: Payer: Self-pay

## 2019-01-21 VITALS — BP 140/78 | HR 97 | Temp 96.8°F | Resp 16 | Ht 68.0 in | Wt 284.9 lb

## 2019-01-21 DIAGNOSIS — I10 Essential (primary) hypertension: Secondary | ICD-10-CM

## 2019-01-21 DIAGNOSIS — F321 Major depressive disorder, single episode, moderate: Secondary | ICD-10-CM | POA: Diagnosis not present

## 2019-01-21 NOTE — Progress Notes (Signed)
Name: Regina Christensen   MRN: [id number]    DOB: 05/09/1955   Date:01/21/2019       Progress Note  Subjective  Chief Complaint  Chief Complaint  Patient presents with  . Depression  . Anxiety    HPI  HTN: She is onClonidine,AzorandBystolic 01/2017 and bp was controlled, however last visit her bp was elevated and is high again this morning. BP was 150/74, but it was at goal during visit to GI 135/87, she states stressed today worrying about having to return to work on Monday. Recheck bp was at goal   DepressionModerate:she is taking Effexor for many years,phq 9was up to  10 during her visit 12/2018 and today it is 10  . The main reason for worsening of her mood is stress from work. She has been working from home since March 2020 because of COVID-19 and was unable to perform well,   supervisor states since she has been working remotely they expect her to be more productive and meet a quota of more than 50 accounts / insurance follow ups with Medicaid. She states since working remotely she has noticed difficulty keeping up with the pace, she was down to 20 per day and gradually improved to about 30-40 per day but it was very stressful keeping up with the number of accounts.  She was having difficulty stayed  focused, and started to develop panic attacks because the decrease in performance could make her lose her job, and she is so close to retirement.  She feels torn between losing her job now but also not sure if she can afford to retire. Taking Effexor, also alprazolam prn and referral was placed for her see psychiatrist , however appointment not until next week. FMLA was filled out , but needs extension for her time off since she has not seen psychiatrist and is still under a lot of stress and not started on new medications/therapy to be able to cope with work at this time   Patient Active Problem List   Diagnosis Date Noted  . Bilateral carpal tunnel syndrome 07/30/2016  . OSA  (obstructive sleep apnea) 03/07/2016  . Depression, major, recurrent, mild (HCC) 11/30/2015  . Osteoarthritis of both knees 11/30/2015  . BPPV (benign paroxysmal positional vertigo) 08/28/2015  . Essential hypertension 11/09/2014  . Hyperglycemia 11/09/2014  . GAD (generalized anxiety disorder) 11/09/2014  . Acanthosis nigricans 11/09/2014  . Morbid obesity (HCC) 01/06/2012    Past Surgical History:  Procedure Laterality Date  . ABDOMINAL HYSTERECTOMY    . BREAST EXCISIONAL BIOPSY Left   . ENDOMETRIAL ABLATION     x2  . GASTRIC BYPASS     sleeve    Family History  Problem Relation Age of Onset  . Heart disease Father   . Cancer Brother     Social History   Socioeconomic History  . Marital status: Divorced    Spouse name: Not on file  . Number of children: 2  . Years of education: Not on file  . Highest education level: High school graduate  Occupational History  . Occupation: patient Tax adviserfinancial services    Employer: [employer]  Social Needs  . Financial resource strain: Not hard at all  . Food insecurity    Worry: Never true    Inability: Never true  . Transportation needs    Medical: No    Non-medical: No  Tobacco Use  . Smoking status: Never Smoker  . Smokeless tobacco: Never Used  Substance and Sexual Activity  .  Alcohol use: No    Alcohol/week: 0.0 standard drinks    Comment: rare  . Drug use: No  . Sexual activity: Never  Lifestyle  . Physical activity    Days per week: 0 days    Minutes per session: 0 min  . Stress: Rather much  Relationships  . Social connections    Talks on phone: More than three times a week    Gets together: More than three times a week    Attends religious service: More than 4 times per year    Active member of club or organization: No    Attends meetings of clubs or organizations: Never    Relationship status: Divorced  . Intimate partner violence    Fear of current or ex partner: No    Emotionally abused: No    Physically  abused: No    Forced sexual activity: No  Other Topics Concern  . 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  acetaminophen (TYLENOL 8 HOUR ARTHRITIS PAIN) 650 MG CR tablet, Take 1 tablet (650 mg total) by mouth every 8 (eight) hours as needed for pain. (Patient taking differently: Take 1,300 mg by mouth every 8 (eight) hours as needed for pain. ), Disp: 90 tablet, Rfl: 0 .  albuterol (PROVENTIL HFA;VENTOLIN HFA) 108 (90 Base) MCG/ACT inhaler, TAKE 2 PUFFS BY MOUTH EVERY 6 HOURS AS NEEDED FOR WHEEZE OR SHORTNESS OF BREATH, Disp: 8.5 Inhaler, Rfl: 0 .  ALPRAZolam (XANAX) 0.5 MG tablet, Take 1 tablet (0.5 mg total) by mouth daily as needed., Disp: 30 tablet, Rfl: 0 .  amLODipine-olmesartan (AZOR) 5-40 MG tablet, Take 1 tablet by mouth daily., Disp: 90 tablet, Rfl: 1 .  Armodafinil 150 MG tablet, Take 1 tablet (150 mg total) by mouth daily., Disp: 30 tablet, Rfl: 2 .  aspirin EC 81 MG tablet, Take 81 mg by mouth daily., Disp: , Rfl:  .  azelastine (ASTELIN) 0.1 % nasal spray, PLACE 2 SPRAYS INTO BOTH NOSTRILS 2 (TWO) TIMES DAILY, Disp: 30 mL, Rfl: 1 .  cloNIDine (CATAPRES - DOSED IN MG/24 HR) 0.1 mg/24hr patch, PLACE 1 PATCH (0.1 MG TOTAL) ONTO THE SKIN ONCE A WEEK., Disp: 12 patch, Rfl: 1 .  famotidine (PEPCID) 20 MG tablet, Take 1 tablet (20 mg total) by mouth daily., Disp: 90 tablet, Rfl: 0 .  fexofenadine (ALLEGRA) 180 MG tablet, Take 180 mg by mouth daily., Disp: , Rfl:  .  nebivolol (BYSTOLIC) 10 MG tablet, Take 1 tablet (10 mg total) by mouth daily., Disp: 90 tablet, Rfl: 1 .  Semaglutide, 1 MG/DOSE, (OZEMPIC, 1 MG/DOSE,) 2 MG/1.5ML SOPN, Inject 1 mg into the skin once a week., Disp: 9 mL, Rfl: 1 .  triamcinolone (NASACORT) 55 MCG/ACT AERO nasal inhaler, Place 2 sprays into the nose daily., Disp: , Rfl:  .  Triamcinolone  Acetonide (TRIAMCINOLONE 0.1 % CREAM : EUCERIN) CREA, Apply 1 application topically 2 (two) times daily., Disp: 1 each, Rfl: 0 .  triamcinolone cream (KENALOG) 0.1 %, APPLY TO AFFECTRED AREA TWICE A DAYT, Disp: 80 g, Rfl: 0 .  venlafaxine XR (EFFEXOR-XR) 150 MG 24 hr capsule, TAKE 1 CAPSULE BY MOUTH EVERY DAY, Disp: 90 capsule, Rfl: 0 .  XIIDRA 5 % SOLN, Place 1 drop into both eyes  2 (two) times daily., Disp: , Rfl: 4  Allergies  Allergen Reactions  . Ace Inhibitors Cough  . Hctz [Hydrochlorothiazide] Rash    face    I personally reviewed active problem list, medication list, allergies, family history, social history, health maintenance with the patient/caregiver today.   ROS  Constitutional: Negative for fever or weight change.  Respiratory: Negative for cough and shortness of breath.   Cardiovascular: Negative for chest pain or palpitations.  Gastrointestinal: Negative for abdominal pain, no bowel changes.  Musculoskeletal: Negative for gait problem or joint swelling.  Skin: Negative for rash.  Neurological: Negative for dizziness or headache.  No other specific complaints in a complete review of systems (except as listed in HPI above).  Objective  Vitals:   01/21/19 1118 01/21/19 1143  BP: (!) 150/74 140/78  Pulse: 97   Resp: 16   Temp: (!) 96.8 F (36 C)   TempSrc: Temporal   SpO2: 97%   Weight: 284 lb 14.4 oz (129.2 kg)   Height: 5\' 8"  (1.727 m)     Body mass index is 43.32 kg/m.  Physical Exam  Constitutional: Patient appears well-developed and well-nourished. Obese  No distress.  HEENT: head atraumatic, normocephalic, pupils equal and reactive to light Cardiovascular: Normal rate, regular rhythm and normal heart sounds.  No murmur heard. No BLE edema. Pulmonary/Chest: Effort normal and breath sounds normal. No respiratory distress. Abdominal: Soft.  There is no tenderness. Psychiatric: Patient has a normal mood and affect. behavior is normal. Judgment and  thought content normal.  PHQ2/9: Depression screen [facility] 2/9 01/21/2019 12/24/2018 09/10/2018 06/21/2018 06/17/2018  Decreased Interest 1 1 0 1 1  Down, Depressed, Hopeless 2 1 1 1 1   PHQ - 2 Score 3 2 1 2 2   Altered sleeping 1 1 1 1 1   Tired, decreased energy 1 1 1 1 1   Change in appetite 2 2 0 1 1  Feeling bad or failure about yourself  1 2 0 1 1  Trouble concentrating 2 2 2 1 1   Moving slowly or fidgety/restless 0 0 1 1 1   Suicidal thoughts 0 - 0 0 0  PHQ-9 Score 10 10 6 8 8   Difficult doing work/chores Somewhat difficult Somewhat difficult Somewhat difficult Somewhat difficult Somewhat difficult  Some recent data might be hidden    phq 9 is positive   Fall Risk: Fall Risk  01/21/2019 12/24/2018 09/10/2018 06/21/2018 06/17/2018  Falls in the past year? 0 0 0 0 0  Number falls in past yr: 0 0 0 0 0  Injury with Fall? 0 0 0 0 0  Risk for fall due to : - - - - History of fall(s)  Follow up - - - - Falls evaluation completed     Functional Status Survey: Is the patient deaf or have difficulty hearing?: No Does the patient have difficulty seeing, even when wearing glasses/contacts?: No Does the patient have difficulty concentrating, remembering, or making decisions?: Yes Does the patient have difficulty walking or climbing stairs?: Yes Does the patient have difficulty dressing or bathing?: No Does the patient have difficulty doing errands alone such as visiting a doctor's office or shopping?: No   Assessment & Plan  1. Moderate major depression (HCC)  I will extend time off for one more month, to give her time to see psychiatrist and improve depression prior to return to work.    2. Essential hypertension  Bp is elevated we will recheck before she leaves

## 2019-01-27 ENCOUNTER — Encounter: Payer: Self-pay | Admitting: Psychiatry

## 2019-01-27 ENCOUNTER — Other Ambulatory Visit: Payer: Self-pay

## 2019-01-27 ENCOUNTER — Ambulatory Visit (INDEPENDENT_AMBULATORY_CARE_PROVIDER_SITE_OTHER): Payer: BC Managed Care – PPO | Admitting: Psychiatry

## 2019-01-27 DIAGNOSIS — F330 Major depressive disorder, recurrent, mild: Secondary | ICD-10-CM | POA: Diagnosis not present

## 2019-01-27 DIAGNOSIS — F411 Generalized anxiety disorder: Secondary | ICD-10-CM

## 2019-01-27 MED ORDER — VENLAFAXINE HCL ER 150 MG PO CP24: ORAL_CAPSULE | ORAL | 0 refills | Status: DC

## 2019-01-27 MED ORDER — VENLAFAXINE HCL ER 75 MG PO CP24: 75.0000 mg | ORAL_CAPSULE | Freq: Every day | ORAL | 0 refills | Status: DC

## 2019-01-27 NOTE — Progress Notes (Signed)
Virtual Visit via Video Note  I connected with Regina Christensen on 01/27/19 at  1:00 PM EDT by a video enabled telemedicine application and verified that I am speaking with the correct person using two identifiers.   I discussed the limitations of evaluation and management by telemedicine and the availability of in person appointments. The patient expressed understanding and agreed to proceed.   I discussed the assessment and treatment plan with the patient. The patient was provided an opportunity to ask questions and all were answered. The patient agreed with the plan and demonstrated an understanding of the instructions.   The patient was advised to call back or seek an in-person evaluation if the symptoms worsen or if the condition fails to improve as anticipated.   Psychiatric Initial Adult Assessment   Patient Identification: Regina Christensen MRN:  [id number] Date of Evaluation:  01/27/2019 Referral Source: [name] MD Chief Complaint:   Chief Complaint    Establish Care     Visit Diagnosis:    ICD-10-CM   1. GAD (generalized anxiety disorder)  F41.1 venlafaxine XR (EFFEXOR XR) 75 MG 24 hr capsule    venlafaxine XR (EFFEXOR-XR) 150 MG 24 hr capsule    TSH  2. MDD (major depressive disorder), recurrent episode, mild (HCC)  F33.0 venlafaxine XR (EFFEXOR XR) 75 MG 24 hr capsule    TSH    History of Present Illness: Regina Christensen is a 63 year old African-American female, divorced, employed, lives in [place], has a history of anxiety, depression, hypertension,gastric bypass , hyperlipidemia, obstructive sleep apnea, arthritis was evaluated by telemedicine today.  Patient reports she was referred to the clinic by her primary care provider since she has been struggling with anxiety and depressive symptoms.  She reports she has been struggling with depressive symptoms since the past several years.  Patient reports she was started on Effexor several years ago.  She is currently  on 150 mg daily.  She reports it does help to some extent with her symptoms.  She however continues to struggle with lack of motivation, inability to focus, sleep problems and some sadness.  She denies any suicidality, homicidality or perceptual disturbances.  She reports her symptoms has been worsening since the past few months.  Patient reports she struggles with anxiety symptoms.  She reports she is often restless, fidgety, has inability to focus and worries about different things.  Her anxiety symptoms have been affecting her work.  She struggles with productivity at work.  She is worried she may get fired from her work.  She hence is currently working on getting her retirement.  She reports she has an upcoming meeting for the same .  She reports she started talking to a therapist-Amanda with [facility] and had a few sessions.  She reports she has 1 more session pending.  It does help to some extent.  Patient reports sleep as a problem.  She reports her sleep is often restless.  She reports she is not ON any sleep medications since she has sleep apnea and IS worried about not being able to wake up when she stops breathing.  She reports she has not been very compliant with her CPAP recently.  She also struggles with hypersomnia during the day.  She reports she can barely keep herself awake at the beginning of the day.  She hence was given a stimulant medication-armodafinil by her primary care provider, has been on it since the past several months.  Patient denies any history of  panic attacks.  Patient denies any history of trauma.  Patient denies any history of substance abuse problems.  Patient reports she has good support system from her daughter who lives with her along with her son-in-law and grandchild.    Associated Signs/Symptoms: Depression Symptoms:  depressed mood, anhedonia, insomnia, hypersomnia, fatigue, feelings of worthlessness/guilt, difficulty  concentrating, anxiety, loss of energy/fatigue, disturbed sleep, (Hypo) Manic Symptoms:  Denies  Anxiety Symptoms:  Excessive Worry, Psychotic Symptoms:  denies PTSD Symptoms: Negative  Past Psychiatric History: Patient with history of depression and anxiety.  Was under the care of her primary care provider.  Patient also sees a therapist-Amanda with [facility].  Patient denies any inpatient mental health admissions.  Patient denies any suicide attempts.  Previous Psychotropic Medications: Yes Prozac, Effexor  Substance Abuse History in the last 12 months:  No.  Consequences of Substance Abuse: Negative  Past Medical History:  Past Medical History:  Diagnosis Date  . Arthritis   . Depression   . Hypertension   . Morbid obesity with BMI of 45.0-49.9, adult (HCC)   . Obesity   . Sleep apnea     Past Surgical History:  Procedure Laterality Date  . ABDOMINAL HYSTERECTOMY    . BREAST EXCISIONAL BIOPSY Left   . ENDOMETRIAL ABLATION     x2  . GASTRIC BYPASS     sleeve    Family Psychiatric History: Denies history of mental health problems in her family.  Family History:  Family History  Problem Relation Age of Onset  . Heart disease Father   . Cancer Brother   . Mental illness Neg Hx     Social History:   Social History   Socioeconomic History  . Marital status: Divorced    Spouse name: Not on file  . Number of children: 2  . Years of education: Not on file  . Highest education level: Associate degree: occupational, Scientist, product/process developmenttechnical, or vocational program  Occupational History  . Occupation: patient Tax adviserfinancial services    Employer: [employer]  Social Needs  . Financial resource strain: Not hard at all  . Food insecurity    Worry: Never true    Inability: Never true  . Transportation needs    Medical: No    Non-medical: No  Tobacco Use  . Smoking status: Never Smoker  . Smokeless tobacco: Never Used  Substance and Sexual Activity  . Alcohol use: No     Alcohol/week: 0.0 standard drinks    Comment: rare  . Drug use: No  . Sexual activity: Never  Lifestyle  . Physical activity    Days per week: 0 days    Minutes per session: 0 min  . Stress: Rather much  Relationships  . Social connections    Talks on phone: More than three times a week    Gets together: More than three times a week    Attends religious service: More than 4 times per year    Active member of club or organization: No    Attends meetings of clubs or organizations: Never    Relationship status: Divorced  Other Topics Concern  . 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  Her daughter son-in-law as well as grandchildren who are 8214 y  and 17 months lives with her.  She has a son who lives close to her and is supportive.  Patient currently works at [employer].  She however is working on retiring.  Patient has a associate degree.  She reports she is divorced.  Patient denies any history of trauma. Allergies:   Allergies  Allergen Reactions  . Ace Inhibitors Cough  . Hctz [Hydrochlorothiazide] Rash    face    Metabolic Disorder Labs: Lab Results  Component Value Date   HGBA1C 5.8 (H) 02/05/2018   MPG 120 02/05/2018   MPG 120 02/24/2017   No results found for: PROLACTIN Lab Results  Component Value Date   CHOL 152 02/05/2018   TRIG 91 02/05/2018   HDL 72 02/05/2018   CHOLHDL 2.1 02/05/2018   VLDL 12 12/14/2015   LDLCALC 63 02/05/2018   LDLCALC 41 02/24/2017   Lab Results  Component Value Date   TSH 0.807 12/05/2014    Therapeutic Level Labs: No results found for: LITHIUM No results found for: CBMZ No results found for: VALPROATE  Current Medications: Current Outpatient Medications  Medication Sig Dispense Refill  . acetaminophen  (TYLENOL 8 HOUR ARTHRITIS PAIN) 650 MG CR tablet Take 1 tablet (650 mg total) by mouth every 8 (eight) hours as needed for pain. (Patient taking differently: Take 1,300 mg by mouth every 8 (eight) hours as needed for pain. ) 90 tablet 0  . albuterol (PROVENTIL HFA;VENTOLIN HFA) 108 (90 Base) MCG/ACT inhaler TAKE 2 PUFFS BY MOUTH EVERY 6 HOURS AS NEEDED FOR WHEEZE OR SHORTNESS OF BREATH 8.5 Inhaler 0  . amLODipine-olmesartan (AZOR) 5-40 MG tablet Take 1 tablet by mouth daily. 90 tablet 1  . Armodafinil 150 MG tablet Take 1 tablet (150 mg total) by mouth daily. 30 tablet 2  . aspirin EC 81 MG tablet Take 81 mg by mouth daily.    Marland Kitchen azelastine (ASTELIN) 0.1 % nasal spray PLACE 2 SPRAYS INTO BOTH NOSTRILS 2 (TWO) TIMES DAILY 30 mL 1  . cloNIDine (CATAPRES - DOSED IN MG/24 HR) 0.1 mg/24hr patch PLACE 1 PATCH (0.1 MG TOTAL) ONTO THE SKIN ONCE A WEEK. 12 patch 1  . famotidine (PEPCID) 20 MG tablet Take 1 tablet (20 mg total) by mouth daily. 90 tablet 0  . fexofenadine (ALLEGRA) 180 MG tablet Take 180 mg by mouth daily.    . nebivolol (BYSTOLIC) 10 MG tablet Take 1 tablet (10 mg total) by mouth daily. 90 tablet 1  . Semaglutide, 1 MG/DOSE, (OZEMPIC, 1 MG/DOSE,) 2 MG/1.5ML SOPN Inject 1 mg into the skin once a week. 9 mL 1  . triamcinolone (NASACORT) 55 MCG/ACT AERO nasal inhaler Place 2 sprays into the nose daily.    . Triamcinolone Acetonide (TRIAMCINOLONE 0.1 % CREAM : EUCERIN) CREA Apply 1 application topically 2 (two) times daily. 1 each 0  . triamcinolone cream (KENALOG) 0.1 % APPLY TO AFFECTRED AREA TWICE A DAYT 80 g 0  . venlafaxine XR (EFFEXOR XR) 75 MG 24 hr capsule Take 1 capsule (75 mg total) by mouth daily with breakfast. To be combined with 150 mg 90 capsule 0  . venlafaxine XR (EFFEXOR-XR) 150 MG 24 hr capsule To be combined with 75 mg 90 capsule 0  . XIIDRA 5 % SOLN Place 1 drop into both eyes 2 (two) times daily.  4   No current facility-administered medications for this visit.      Musculoskeletal: Strength & Muscle Tone: UTA Gait & Station: normal Patient leans: N/A  Psychiatric  Specialty Exam: Review of Systems  Psychiatric/Behavioral: Positive for depression. The patient is nervous/anxious and has insomnia.   All other systems reviewed and are negative.   There were no vitals taken for this visit.There is no height or weight on file to calculate BMI.  General Appearance: Casual  Eye Contact:  Fair  Speech:  Clear and Coherent  Volume:  Normal  Mood:  Anxious and Depressed  Affect:  Congruent  Thought Process:  Goal Directed and Descriptions of Associations: Intact  Orientation:  Full (Time, Place, and Person)  Thought Content:  Logical  Suicidal Thoughts:  No  Homicidal Thoughts:  No  Memory:  Immediate;   Fair Recent;   Fair Remote;   Fair  Judgement:  Fair  Insight:  Fair  Psychomotor Activity:  Normal  Concentration:  Concentration: Fair and Attention Span: Fair  Recall:  [employer] of Knowledge:Fair  Language: Fair  Akathisia:  No  Handed:  Right  AIMS (if indicated):Denies tremors, rigidity  Assets:  Communication Skills Desire for [employer] Talents/Skills  ADL's:  Intact  Cognition: WNL  Sleep:  Poor restless at night , excessive sleep during the day   Screenings: GAD-7     Office Visit from 01/27/2019 in [facility] Visit from 01/21/2019 in [facility] Office Visit from 06/21/2018 in [facility] Office Visit from 04/26/2018 in [facility] Office Visit from 02/05/2018 in [facility]  Total GAD-7 Score  10  6  0  0  0    PHQ2-9     Office Visit from 01/27/2019 in [place] Office Visit from 01/21/2019 in [facility] Office Visit from 12/24/2018 in [facility] Office Visit from 09/10/2018 in [facility] Office  Visit from 06/21/2018 in [facility]  PHQ-2 Total Score  4  3  2  1  2   PHQ-9 Total Score  15  10  10  6  8       Assessment and Plan: Regina Christensen is a 63 year old African-American female, divorced, employed, lives in [place], has a history of anxiety, depression, hypertension, hyperlipidemia, obstructive sleep apnea, arthritis was evaluated by telemedicine today.  Patient is biologically predisposed given her health problems .  Patient does have psychosocial stressors of work-related problems and current pandemic.  She however currently denies any suicidality or past history of suicide attempts or family history of mental health problems.  She also currently denies any substance abuse problems.  Patient will benefit from medications as well as psychotherapy sessions.   Plan GAD- unstable GAD 7 equals 10 Increase venlafaxine XR to 225 mg p.o. daily Discussed with patient to continue to work with her therapist- with [facility]  MDD- unstable PHQ-9 equals 15 Effexor will help. Patient does have sleep problems however is worried about being on sleep medication due to her obstructive sleep apnea.  Encouraged her to continue to use her CPAP.  She is noncompliant. Also discussed with her that she could be started on low dosage of medications like Ambien as needed.  She however will talk to her sleep provider first. She could also use melatonin 1 to 3 mg p.o. nightly-since she prefers to use something more natural. Also discussed that armodafinil could also contribute to increased anxiety.  If she is struggling with significant anxiety during the day should consider going down on the armodafinil.  It is currently  being prescribed by her primary care provider.  We will recommend getting TSH-she will talk to her primary care provider.  Patient to sign a release to obtain medical records from her therapist.  Follow-up in clinic in 4 weeks or sooner if needed.  Nov 2 at  11:30 AM  I have spent atleast 60 minutes non  face to face with patient today. More than 50 % of the time was spent for psychoeducation and supportive psychotherapy and care coordination. This note was generated in part or whole with voice recognition software. Voice recognition is usually quite accurate but there are transcription errors that can and very often do occur. I apologize for any typographical errors that were not detected and corrected.      Jomarie Longs, MD 10/8/20205:12 PM

## 2019-01-31 ENCOUNTER — Telehealth: Payer: Self-pay | Admitting: Family Medicine

## 2019-01-31 NOTE — Telephone Encounter (Signed)
Last few days the pt has been hot and sweaty. Short and tshirts is the best thing for her to wear. Please advise

## 2019-02-03 ENCOUNTER — Other Ambulatory Visit
Admission: RE | Admit: 2019-02-03 | Discharge: 2019-02-03 | Disposition: A | Discharge status: Home / Self Care | Payer: BC Managed Care – PPO | Source: Ambulatory Visit | Attending: Family Medicine | Admitting: Family Medicine

## 2019-02-03 ENCOUNTER — Encounter: Payer: Self-pay | Admitting: Family Medicine

## 2019-02-03 ENCOUNTER — Other Ambulatory Visit: Payer: Self-pay

## 2019-02-03 ENCOUNTER — Ambulatory Visit (INDEPENDENT_AMBULATORY_CARE_PROVIDER_SITE_OTHER): Payer: BC Managed Care – PPO | Admitting: Cardiology

## 2019-02-03 ENCOUNTER — Encounter: Payer: Self-pay | Admitting: Cardiology

## 2019-02-03 ENCOUNTER — Ambulatory Visit: Payer: BC Managed Care – PPO | Admitting: Family Medicine

## 2019-02-03 VITALS — BP 124/96 | HR 137 | Ht 68.0 in | Wt 290.5 lb

## 2019-02-03 VITALS — BP 136/82 | HR 80 | Temp 95.9°F | Resp 16 | Ht 68.0 in | Wt 284.0 lb

## 2019-02-03 DIAGNOSIS — R232 Flushing: Secondary | ICD-10-CM

## 2019-02-03 DIAGNOSIS — I4891 Unspecified atrial fibrillation: Secondary | ICD-10-CM | POA: Insufficient documentation

## 2019-02-03 DIAGNOSIS — R635 Abnormal weight gain: Secondary | ICD-10-CM | POA: Insufficient documentation

## 2019-02-03 DIAGNOSIS — R7303 Prediabetes: Secondary | ICD-10-CM

## 2019-02-03 DIAGNOSIS — E8881 Metabolic syndrome: Secondary | ICD-10-CM | POA: Insufficient documentation

## 2019-02-03 DIAGNOSIS — I10 Essential (primary) hypertension: Secondary | ICD-10-CM

## 2019-02-03 DIAGNOSIS — R0600 Dyspnea, unspecified: Secondary | ICD-10-CM | POA: Diagnosis not present

## 2019-02-03 DIAGNOSIS — Z13220 Encounter for screening for lipoid disorders: Secondary | ICD-10-CM | POA: Diagnosis present

## 2019-02-03 DIAGNOSIS — R0602 Shortness of breath: Secondary | ICD-10-CM

## 2019-02-03 DIAGNOSIS — R002 Palpitations: Secondary | ICD-10-CM | POA: Diagnosis not present

## 2019-02-03 DIAGNOSIS — R0609 Other forms of dyspnea: Secondary | ICD-10-CM

## 2019-02-03 LAB — CBC WITH DIFFERENTIAL/PLATELET
Abs Immature Granulocytes: 0.17 10*3/uL — ABNORMAL HIGH (ref 0.00–0.07)
Basophils Absolute: 0.1 10*3/uL (ref 0.0–0.1)
Basophils Relative: 0 %
Eosinophils Absolute: 0.1 10*3/uL (ref 0.0–0.5)
Eosinophils Relative: 1 %
HCT: 39.3 % (ref 36.0–46.0)
Hemoglobin: 12.8 g/dL (ref 12.0–15.0)
Immature Granulocytes: 1 %
Lymphocytes Relative: 18 %
Lymphs Abs: 2.8 10*3/uL (ref 0.7–4.0)
MCH: 29.6 pg (ref 26.0–34.0)
MCHC: 32.6 g/dL (ref 30.0–36.0)
MCV: 90.8 fL (ref 80.0–100.0)
Monocytes Absolute: 1.1 10*3/uL — ABNORMAL HIGH (ref 0.1–1.0)
Monocytes Relative: 7 %
Neutro Abs: 11.4 10*3/uL — ABNORMAL HIGH (ref 1.7–7.7)
Neutrophils Relative %: 73 %
Platelets: 387 10*3/uL (ref 150–400)
RBC: 4.33 MIL/uL (ref 3.87–5.11)
RDW: 15.7 % — ABNORMAL HIGH (ref 11.5–15.5)
WBC: 15.6 10*3/uL — ABNORMAL HIGH (ref 4.0–10.5)
nRBC: 0 % (ref 0.0–0.2)

## 2019-02-03 LAB — COMPREHENSIVE METABOLIC PANEL
ALT: 33 U/L (ref 0–44)
AST: 17 U/L (ref 15–41)
Albumin: 3.6 g/dL (ref 3.5–5.0)
Alkaline Phosphatase: 82 U/L (ref 38–126)
Anion gap: 10 (ref 5–15)
BUN: 13 mg/dL (ref 8–23)
CO2: 26 mmol/L (ref 22–32)
Calcium: 8.7 mg/dL — ABNORMAL LOW (ref 8.9–10.3)
Chloride: 104 mmol/L (ref 98–111)
Creatinine, Ser: 0.75 mg/dL (ref 0.44–1.00)
GFR calc Af Amer: >60 mL/min (ref >60)
GFR calc non Af Amer: >60 mL/min (ref >60)
Glucose, Bld: 120 mg/dL — ABNORMAL HIGH (ref 70–99)
Potassium: 4 mmol/L (ref 3.5–5.1)
Sodium: 140 mmol/L (ref 135–145)
Total Bilirubin: 0.6 mg/dL (ref 0.3–1.2)
Total Protein: 6.7 g/dL (ref 6.5–8.1)

## 2019-02-03 LAB — LIPID PANEL
Cholesterol: 129 mg/dL (ref 0–200)
HDL: 76 mg/dL (ref >40)
LDL Cholesterol: 36 mg/dL (ref 0–99)
Total CHOL/HDL Ratio: 1.7 RATIO
Triglycerides: 86 mg/dL (ref <150)
VLDL: 17 mg/dL (ref 0–40)

## 2019-02-03 LAB — HEMOGLOBIN A1C
Hgb A1c MFr Bld: 6.3 % — ABNORMAL HIGH (ref 4.8–5.6)
Mean Plasma Glucose: 134.11 mg/dL

## 2019-02-03 LAB — TSH: TSH: 1.288 u[IU]/mL (ref 0.350–4.500)

## 2019-02-03 MED ORDER — METOPROLOL TARTRATE 25 MG PO TABS: 25.0000 mg | ORAL_TABLET | Freq: Two times a day (BID) | ORAL | 1 refills | Status: DC

## 2019-02-03 MED ORDER — APIXABAN 5 MG PO TABS: 5.0000 mg | ORAL_TABLET | Freq: Two times a day (BID) | ORAL | 5 refills | Status: DC

## 2019-02-03 NOTE — Addendum Note (Signed)
Addended by: Steele Sizer F on: 02/03/2019 10:10 AM   Modules accepted: Orders

## 2019-02-03 NOTE — Progress Notes (Signed)
Cardiology Office Note:    Date:  02/03/2019   ID:  Regina Christensen, DOB [birth year], MRN [id number]  PCP:  Steele Sizer, MD  Cardiologist:  Kate Sable, MD  Electrophysiologist:  None   Referring MD: Steele Sizer, MD   Chief Complaint  Patient presents with  . New Patient (Initial Visit)    referred by PCP for New onset afib, Palpitations, and SOB. Meds reviewed verbally with patient.     History of Present Illness:    Regina Christensen is a 63 y.o. female with a hx of hypertension, obesity, sleep apnea who presents as a referral due to new onset atrial fibrillation.  Patient went to see primary care provider earlier today with complaints of few days of hot sensation feeling.  EKG at the primary care's office did reveal a fibrillation with rapid ventricular response, ventricular rate 150.  Also endorses shortness of breath with activity, especially when going up stairs.  She had a sensation of her heart racing this morning, she tried to do breathing exercises but symptoms persisted.  She denies chest pain, dizziness, syncope.  Her last TSH was normal.  Past Medical History:  Diagnosis Date  . Arthritis   . Depression   . Hypertension   . Morbid obesity with BMI of 45.0-49.9, adult ([place])   . Obesity   . Sleep apnea     Past Surgical History:  Procedure Laterality Date  . ABDOMINAL HYSTERECTOMY    . BREAST EXCISIONAL BIOPSY Left   . ENDOMETRIAL ABLATION     x2  . GASTRIC BYPASS     sleeve    Current Medications: Current Meds  Medication Sig  . acetaminophen (TYLENOL 8 HOUR ARTHRITIS PAIN) 650 MG CR tablet Take 1 tablet (650 mg total) by mouth every 8 (eight) hours as needed for pain. (Patient taking differently: Take 1,300 mg by mouth every 8 (eight) hours as needed for pain. )  . albuterol (PROVENTIL HFA;VENTOLIN HFA) 108 (90 Base) MCG/ACT inhaler TAKE 2 PUFFS BY MOUTH EVERY 6 HOURS AS NEEDED FOR WHEEZE OR SHORTNESS OF BREATH  . amLODipine-olmesartan  (AZOR) 5-40 MG tablet Take 1 tablet by mouth daily.  . Armodafinil 150 MG tablet Take 1 tablet (150 mg total) by mouth daily.  Marland Kitchen azelastine (ASTELIN) 0.1 % nasal spray PLACE 2 SPRAYS INTO BOTH NOSTRILS 2 (TWO) TIMES DAILY  . cloNIDine (CATAPRES - DOSED IN MG/24 HR) 0.1 mg/24hr patch PLACE 1 PATCH (0.1 MG TOTAL) ONTO THE SKIN ONCE A WEEK.  . famotidine (PEPCID) 20 MG tablet Take 1 tablet (20 mg total) by mouth daily.  . fexofenadine (ALLEGRA) 180 MG tablet Take 180 mg by mouth daily.  . nebivolol (BYSTOLIC) 10 MG tablet Take 1 tablet (10 mg total) by mouth daily.  . Semaglutide, 1 MG/DOSE, (OZEMPIC, 1 MG/DOSE,) 2 MG/1.5ML SOPN Inject 1 mg into the skin once a week.  . triamcinolone (NASACORT) 55 MCG/ACT AERO nasal inhaler Place 2 sprays into the nose daily.  . Triamcinolone Acetonide (TRIAMCINOLONE 0.1 % CREAM : EUCERIN) CREA Apply 1 application topically 2 (two) times daily.  Marland Kitchen triamcinolone cream (KENALOG) 0.1 % APPLY TO AFFECTRED AREA TWICE A DAYT  . venlafaxine XR (EFFEXOR XR) 75 MG 24 hr capsule Take 1 capsule (75 mg total) by mouth daily with breakfast. To be combined with 150 mg  . venlafaxine XR (EFFEXOR-XR) 150 MG 24 hr capsule To be combined with 75 mg  . XIIDRA 5 % SOLN Place 1 drop into both eyes 2 (  two) times daily.  . [DISCONTINUED] aspirin EC 81 MG tablet Take 81 mg by mouth daily.     Allergies:   Ace inhibitors and Hctz [hydrochlorothiazide]   Social History   Socioeconomic History  . Marital status: Divorced    Spouse name: Not on file  . Number of children: 2  . Years of education: Not on file  . Highest education level: Associate degree: occupational, Scientist, product/process developmenttechnical, or vocational program  Occupational History  . Occupation: patient Tax adviserfinancial services    Employer: [employer]  Social Needs  . Financial resource strain: Not hard at all  . Food insecurity    Worry: Never true    Inability: Never true  . Transportation needs    Medical: No    Non-medical: No  Tobacco Use  .  Smoking status: Never Smoker  . Smokeless tobacco: Never Used  Substance and Sexual Activity  . Alcohol use: No    Alcohol/week: 0.0 standard drinks    Comment: rare  . Drug use: No  . Sexual activity: Never  Lifestyle  . Physical activity    Days per week: 0 days    Minutes per session: 0 min  . Stress: Rather much  Relationships  . Social connections    Talks on phone: More than three times a week    Gets together: More than three times a week    Attends religious service: More than 4 times per year    Active member of club or organization: No    Attends meetings of clubs or organizations: Never    Relationship status: Divorced  Other Topics Concern  . 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 There is no history of Mental illness.  Her sister has atrial fibrillation, multiple nephews have atrial fibrillation.  ROS:   Please see the history of present illness.     All other systems reviewed and are negative.  EKGs/Labs/Other Studies Reviewed:    The following studies were reviewed today:   EKG:  EKG is  ordered today.  The ekg ordered today demonstrates atrial fibrillation, rapid ventricular response, heart rate 137  Recent Labs: 02/03/2019: ALT 33; BUN 13; Creatinine, Ser 0.75; Hemoglobin 12.8; Platelets 387; Potassium 4.0; Sodium 140; TSH 1.288  Recent Lipid Panel    Component Value Date/Time   CHOL 129 02/03/2019 1050   CHOL 151 12/05/2014 1117   TRIG 86 02/03/2019 1050   HDL 76 02/03/2019 1050   HDL 74 12/05/2014 1117   CHOLHDL 1.7 02/03/2019 1050   VLDL 17 02/03/2019 1050   LDLCALC 36 02/03/2019 1050   LDLCALC 63 02/05/2018 1532    Physical Exam:    VS:  BP (!) 124/96 (BP Location: Right Arm, Patient  Position: Sitting, Cuff Size: Normal)   Pulse (!) 137   Ht 5\' 8"  (1.727 m)   Wt 290 lb 8 oz (131.8 kg)   BMI 44.17 kg/m     Wt Readings from Last 3 Encounters:  02/03/19 290 lb 8 oz (131.8 kg)  02/03/19 284 lb (128.8 kg)  01/21/19 284 lb 14.4 oz (129.2 kg)     GEN:  Well nourished, well developed in no acute distress HEENT: Normal NECK:  No JVD; No carotid bruits LYMPHATICS: No lymphadenopathy CARDIAC: Irregular irregular, tachycardic, no murmurs, rubs, gallops RESPIRATORY:  Clear to auscultation without rales, wheezing or rhonchi  ABDOMEN: Soft, non-tender, non-distended MUSCULOSKELETAL:  No edema; No deformity  SKIN: Warm and dry NEUROLOGIC:  Alert and oriented x 3 PSYCHIATRIC:  Normal affect   ASSESSMENT:   Patient with new onset atrial fibrillation.  CHA2DS2-VASc score is 2(hypertension, gender).  Dyspnea on exertion sounds slightly related to morbid obesity and deconditioning.  We will however evaluate with echo.  If symptoms persist we will consider stress test.  1. Atrial fibrillation, unspecified type (HCC)   2. Essential hypertension   3. Morbid obesity (H[facility])   4. Dyspnea on exertion    PLAN:    In order of problems listed above:  1. Start Eliquis 5 mg twice daily, start Lopressor 25 mg twice daily.  Get echocardiogram. 2. Continue amlodipine and olmesartan combination pill. 3. Weight loss advised. 4. Get echocardiogram.  Follow-up 6 weeks.  Total encounter time more than 45 minutes  Greater than 50% was spent in counseling and coordination of care with the patient     This note was generated in part or whole with voice recognition software. Voice recognition is usually quite accurate but there are transcription errors that can and very often do occur. I apologize for any typographical errors that were not detected and corrected.  Medication Adjustments/Labs and Tests Ordered: Current medicines are reviewed at length with the patient today.  Concerns  regarding medicines are outlined above.  Orders Placed This Encounter  Procedures  . EKG 12-Lead  . ECHOCARDIOGRAM COMPLETE   Meds ordered this encounter  Medications  . metoprolol tartrate (LOPRESSOR) 25 MG tablet    Sig: Take 1 tablet (25 mg total) by mouth 2 (two) times daily.    Dispense:  180 tablet    Refill:  1  . apixaban (ELIQUIS) 5 MG TABS tablet    Sig: Take 1 tablet (5 mg total) by mouth 2 (two) times daily.    Dispense:  60 tablet    Refill:  5    Patient Instructions  Medication Instructions:  Your physician has recommended you make the following change in your medication:  1) STOP Aspirin 2) START Eliquis  twice daily. An Rx has been sent to your pharmacy 3) START Metoprolol tartrate  twice daily. An Rx has been sent to your pharmacy.  *If you need a refill on your cardiac medications before your next appointment, please call your pharmacy*  Lab Work: None ordered If you have labs (blood work) drawn today and your tests are completely normal, you will receive your results only by: Marland Kitchen MyChart Message (if you have MyChart) OR . A paper copy in the mail If you have any lab test that is abnormal or we need to change your treatment, we will call you to review the results.  Testing/Procedures: Your physician has requested that you have an echocardiogram. Echocardiography is a painless test that uses sound waves to create images of your heart. It provides your doctor with information about the size and shape of your heart and how well your heart's chambers and valves are working. This procedure takes approximately one hour. There are no restrictions for this procedure.    Follow-Up: At [facility], you and your health needs are our priority.  As part of our continuing mission to provide you with exceptional heart care, we have created designated Provider Care Teams.  These Care Teams  include your primary Cardiologist (physician) and Advanced Practice  Providers (APPs -  Physician Assistants and Nurse Practitioners) who all work together to provide you with the care you need, when you need it.  Your next appointment:   6 weeks  The format for your next appointment:   In Person  Provider:    You may see Debbe Odea, MD or one of the following Advanced Practice Providers on your designated Care Team:    Nicolasa Ducking, NP  Eula Listen, PA-C  Marisue Ivan, PA-C   Other Instructions N/A     Signed, Debbe Odea, MD  02/03/2019 3:09 PM    [facility]

## 2019-02-03 NOTE — Addendum Note (Signed)
Addended by: Steele Sizer F on: 02/03/2019 09:33 AM   Modules accepted: Orders

## 2019-02-03 NOTE — Patient Instructions (Signed)
Medication Instructions:  Your physician has recommended you make the following change in your medication:  1) STOP Aspirin 2) START Eliquis 5mg  twice daily. An Rx has been sent to your pharmacy 3) START Metoprolol tartrate 25mg  twice daily. An Rx has been sent to your pharmacy.  *If you need a refill on your cardiac medications before your next appointment, please call your pharmacy*  Lab Work: None ordered If you have labs (blood work) drawn today and your tests are completely normal, you will receive your results only by: Marland Kitchen MyChart Message (if you have MyChart) OR . A paper copy in the mail If you have any lab test that is abnormal or we need to change your treatment, we will call you to review the results.  Testing/Procedures: Your physician has requested that you have an echocardiogram. Echocardiography is a painless test that uses sound waves to create images of your heart. It provides your doctor with information about the size and shape of your heart and how well your heart's chambers and valves are working. This procedure takes approximately one hour. There are no restrictions for this procedure.    Follow-Up: At [facility], you and your health needs are our priority.  As part of our continuing mission to provide you with exceptional heart care, we have created designated Provider Care Teams.  These Care Teams include your primary Cardiologist (physician) and Advanced Practice Providers (APPs -  Physician Assistants and Nurse Practitioners) who all work together to provide you with the care you need, when you need it.  Your next appointment:   6 weeks  The format for your next appointment:   In Person  Provider:    You may see Kate Sable, MD or one of the following Advanced Practice Providers on your designated Care Team:    Murray Hodgkins, NP  Christell Faith, PA-C  Marrianne Mood, PA-C   Other Instructions N/A

## 2019-02-03 NOTE — Progress Notes (Addendum)
Name: Regina Christensen   MRN: [id number]    DOB: 05-Jun-1955   Date:02/03/2019       Progress Note  Subjective  Chief Complaint  Chief Complaint  Patient presents with  . Advice Only    She wants advice on why is she always hot. What she is experiencing is not hot flashes.    HPI  Hot sensation: she went to Dr. Shea Christensen on 01/27/2019, she was advised to increase effexor dose. She states that the symptoms started a few days prior to the increase dose of medication. She was advised to get TSH by Dr. Shea Christensen. She states different than hot flashes.   Polyphagia: she states she is feeling hungry all the time, gaining weight. Hot sensation  SOB with activity: no chest pain or diaphoresis , she noticed that with increase in weight gain, no wheezing, she has noticed intermittent palpitation initially only when going up the stairs, however this morning she also felt when she woke up. No syncope episodes or dizziness   Patient Active Problem List   Diagnosis Date Noted  . Bilateral carpal tunnel syndrome 07/30/2016  . OSA (obstructive sleep apnea) 03/07/2016  . MDD (major depressive disorder), recurrent episode, mild ([place]) 11/30/2015  . Osteoarthritis of both knees 11/30/2015  . BPPV (benign paroxysmal positional vertigo) 08/28/2015  . Essential hypertension 11/09/2014  . Hyperglycemia 11/09/2014  . GAD (generalized anxiety disorder) 11/09/2014  . Acanthosis nigricans 11/09/2014  . Morbid obesity ([place]) 01/06/2012    Past Surgical History:  Procedure Laterality Date  . ABDOMINAL HYSTERECTOMY    . BREAST EXCISIONAL BIOPSY Left   . ENDOMETRIAL ABLATION     x2  . GASTRIC BYPASS     sleeve    Family History  Problem Relation Age of Onset  . Heart disease Father   . Cancer Brother   . Mental illness Neg Hx     Social History   Socioeconomic History  . Marital status: Divorced    Spouse name: Not on file  . Number of children: 2  . Years of education: Not on file  . Highest  education level: Associate degree: occupational, Hotel manager, or vocational program  Occupational History  . Occupation: patient Camera operator: [employer]  Social Needs  . Financial resource strain: Not hard at all  . Food insecurity    Worry: Never true    Inability: Never true  . Transportation needs    Medical: No    Non-medical: No  Tobacco Use  . Smoking status: Never Smoker  . Smokeless tobacco: Never Used  Substance and Sexual Activity  . Alcohol use: No    Alcohol/week: 0.0 standard drinks    Comment: rare  . Drug use: No  . Sexual activity: Never  Lifestyle  . Physical activity    Days per week: 0 days    Minutes per session: 0 min  . Stress: Rather much  Relationships  . Social connections    Talks on phone: More than three times a week    Gets together: More than three times a week    Attends religious service: More than 4 times per year    Active member of club or organization: No    Attends meetings of clubs or organizations: Never    Relationship status: Divorced  . Intimate partner violence    Fear of current or ex partner: No    Emotionally abused: No    Physically abused: No    Forced  sexual activity: No  Other Topics Concern  . 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  acetaminophen (TYLENOL 8 HOUR ARTHRITIS PAIN) 650 MG CR tablet, Take 1 tablet (650 mg total) by mouth every 8 (eight) hours as needed for pain. (Patient taking differently: Take 1,300 mg by mouth every 8 (eight) hours as needed for pain. ), Disp: 90 tablet, Rfl: 0 .  albuterol (PROVENTIL HFA;VENTOLIN HFA) 108 (90 Base) MCG/ACT inhaler, TAKE 2 PUFFS BY MOUTH EVERY 6 HOURS AS NEEDED FOR WHEEZE OR SHORTNESS OF BREATH, Disp: 8.5 Inhaler, Rfl: 0 .  amLODipine-olmesartan (AZOR) 5-40 MG tablet, Take 1 tablet  by mouth daily., Disp: 90 tablet, Rfl: 1 .  Armodafinil 150 MG tablet, Take 1 tablet (150 mg total) by mouth daily., Disp: 30 tablet, Rfl: 2 .  aspirin EC 81 MG tablet, Take 81 mg by mouth daily., Disp: , Rfl:  .  azelastine (ASTELIN) 0.1 % nasal spray, PLACE 2 SPRAYS INTO BOTH NOSTRILS 2 (TWO) TIMES DAILY, Disp: 30 mL, Rfl: 1 .  cloNIDine (CATAPRES - DOSED IN MG/24 HR) 0.1 mg/24hr patch, PLACE 1 PATCH (0.1 MG TOTAL) ONTO THE SKIN ONCE A WEEK., Disp: 12 patch, Rfl: 1 .  famotidine (PEPCID) 20 MG tablet, Take 1 tablet (20 mg total) by mouth daily., Disp: 90 tablet, Rfl: 0 .  fexofenadine (ALLEGRA) 180 MG tablet, Take 180 mg by mouth daily., Disp: , Rfl:  .  nebivolol (BYSTOLIC) 10 MG tablet, Take 1 tablet (10 mg total) by mouth daily., Disp: 90 tablet, Rfl: 1 .  Semaglutide, 1 MG/DOSE, (OZEMPIC, 1 MG/DOSE,) 2 MG/1.5ML SOPN, Inject 1 mg into the skin once a week., Disp: 9 mL, Rfl: 1 .  triamcinolone (NASACORT) 55 MCG/ACT AERO nasal inhaler, Place 2 sprays into the nose daily., Disp: , Rfl:  .  Triamcinolone Acetonide (TRIAMCINOLONE 0.1 % CREAM : EUCERIN) CREA, Apply 1 application topically 2 (two) times daily., Disp: 1 each, Rfl: 0 .  triamcinolone cream (KENALOG) 0.1 %, APPLY TO AFFECTRED AREA TWICE A DAYT, Disp: 80 g, Rfl: 0 .  venlafaxine XR (EFFEXOR XR) 75 MG 24 hr capsule, Take 1 capsule (75 mg total) by mouth daily with breakfast. To be combined with 150 mg, Disp: 90 capsule, Rfl: 0 .  venlafaxine XR (EFFEXOR-XR) 150 MG 24 hr capsule, To be combined with 75 mg, Disp: 90 capsule, Rfl: 0 .  XIIDRA 5 % SOLN, Place 1 drop into both eyes 2 (two) times daily., Disp: , Rfl: 4  Allergies  Allergen Reactions  . Ace Inhibitors Cough  . Hctz [Hydrochlorothiazide] Rash    face    I personally reviewed active problem list, medication list, allergies, family history, social history, health maintenance with the patient/caregiver today.   ROS  Constitutional: Negative for fever, positive for  weight  change.  Respiratory: Negative for cough , positive for  shortness of breath.   Cardiovascular: Negative for chest pain , positive for palpitations.  Gastrointestinal: Negative for abdominal pain, no bowel changes.  Musculoskeletal: Negative for gait problem or joint swelling.  Skin: Negative for rash.  Neurological: Negative for dizziness or headache.  No other specific complaints in a complete review of systems (except as listed in HPI above).   Objective  Vitals:  02/03/19 0848 02/03/19 0856  BP: 100/70 136/82  Pulse: 80   Resp: 16   Temp: (!) 95.9 F (35.5 C)   TempSrc: Temporal   Weight: 284 lb (128.8 kg)   Height: 5\' 8"  (1.727 m)     Body mass index is 43.18 kg/m.  Physical Exam  Constitutional: Patient appears well-developed and well-nourished. Obese  No distress.  HEENT: head atraumatic, normocephalic, pupils equal and reactive to light Cardiovascular: tachycardia during auscultation  irregular rhythm and normal heart sounds.  No murmur heard. No BLE edema. Pulmonary/Chest: Effort normal and breath sounds normal. No respiratory distress. Abdominal: Soft.  There is no tenderness. Psychiatric: Patient has a normal mood and affect. behavior is normal. Judgment and thought content normal.  PHQ2/9: Depression screen [facility] 2/9 02/03/2019 01/21/2019 12/24/2018 09/10/2018 06/21/2018  Decreased Interest 1 1 1  0 1  Down, Depressed, Hopeless 2 2 1 1 1   PHQ - 2 Score 3 3 2 1 2   Altered sleeping 1 1 1 1 1   Tired, decreased energy 1 1 1 1 1   Change in appetite 2 2 2  0 1  Feeling bad or failure about yourself  1 1 2  0 1  Trouble concentrating 2 2 2 2 1   Moving slowly or fidgety/restless 0 0 0 1 1  Suicidal thoughts 0 0 - 0 0  PHQ-9 Score 10 10 10 6 8   Difficult doing work/chores - Somewhat difficult Somewhat difficult Somewhat difficult Somewhat difficult  Some encounter information is confidential and restricted. Go to Review Flowsheets activity to see all data.  Some recent  data might be hidden    phq 9 is positive   Fall Risk: Fall Risk  01/21/2019 12/24/2018 09/10/2018 06/21/2018 06/17/2018  Falls in the past year? 0 0 0 0 0  Number falls in past yr: 0 0 0 0 0  Injury with Fall? 0 0 0 0 0  Risk for fall due to : - - - - History of fall(s)  Follow up - - - - Falls evaluation completed    Functional Status Survey: Is the patient deaf or have difficulty hearing?: No Does the patient have difficulty seeing, even when wearing glasses/contacts?: No Does the patient have difficulty concentrating, remembering, or making decisions?: Yes Does the patient have difficulty walking or climbing stairs?: Yes Does the patient have difficulty dressing or bathing?: No Does the patient have difficulty doing errands alone such as visiting a doctor's office or shopping?: No    Assessment & Plan  1. Essential hypertension  - COMPLETE METABOLIC PANEL WITH GFR - CBC with Differential/Platelet  2. Metabolic syndrome  - Hemoglobin A1c  3. Prediabetes   4. Hot flashes  - CBC with Differential/Platelet - TSH  5. Weight gain  - Hemoglobin A1c  6. Lipid screening  - Lipid panel  7. Palpitation  - EKG 12-Lead  8. SOB (shortness of breath) on exertion  - EKG 12-Lead - Ambulatory referral to Cardiology  9. Atrial fibrillation, new onset [facility])  - Ambulatory referral to Cardiology - she will be seen today at 2:20 pm patient notified before she left our office. Appreciated Dr. assistance in getting appointment scheduled

## 2019-02-03 NOTE — Addendum Note (Signed)
Addended by: Chilton Greathouse on: 02/03/2019 10:17 AM   Modules accepted: Orders

## 2019-02-04 ENCOUNTER — Ambulatory Visit
Admission: RE | Admit: 2019-02-04 | Discharge: 2019-02-04 | Disposition: A | Discharge status: Home / Self Care | Payer: BC Managed Care – PPO | Source: Ambulatory Visit | Attending: Family Medicine | Admitting: Family Medicine

## 2019-02-04 DIAGNOSIS — Z1231 Encounter for screening mammogram for malignant neoplasm of breast: Secondary | ICD-10-CM

## 2019-02-06 ENCOUNTER — Other Ambulatory Visit: Payer: Self-pay | Admitting: Family Medicine

## 2019-02-06 DIAGNOSIS — D72829 Elevated white blood cell count, unspecified: Secondary | ICD-10-CM

## 2019-02-07 ENCOUNTER — Encounter: Payer: Self-pay | Admitting: Emergency Medicine

## 2019-02-07 ENCOUNTER — Emergency Department: Payer: BC Managed Care – PPO

## 2019-02-07 ENCOUNTER — Other Ambulatory Visit: Payer: Self-pay

## 2019-02-07 ENCOUNTER — Inpatient Hospital Stay
Admission: EM | Admit: 2019-02-07 | Discharge: 2019-02-09 | DRG: 309 | Disposition: A | Discharge status: Home / Self Care | Payer: BC Managed Care – PPO | Attending: Internal Medicine | Admitting: Internal Medicine

## 2019-02-07 DIAGNOSIS — I4891 Unspecified atrial fibrillation: Secondary | ICD-10-CM | POA: Diagnosis present

## 2019-02-07 DIAGNOSIS — R002 Palpitations: Secondary | ICD-10-CM | POA: Diagnosis not present

## 2019-02-07 DIAGNOSIS — D72829 Elevated white blood cell count, unspecified: Secondary | ICD-10-CM | POA: Diagnosis present

## 2019-02-07 DIAGNOSIS — Z888 Allergy status to other drugs, medicaments and biological substances status: Secondary | ICD-10-CM

## 2019-02-07 DIAGNOSIS — I119 Hypertensive heart disease without heart failure: Secondary | ICD-10-CM | POA: Diagnosis present

## 2019-02-07 DIAGNOSIS — I4819 Other persistent atrial fibrillation: Secondary | ICD-10-CM | POA: Diagnosis not present

## 2019-02-07 DIAGNOSIS — G4733 Obstructive sleep apnea (adult) (pediatric): Secondary | ICD-10-CM | POA: Diagnosis present

## 2019-02-07 DIAGNOSIS — Z9884 Bariatric surgery status: Secondary | ICD-10-CM

## 2019-02-07 DIAGNOSIS — I10 Essential (primary) hypertension: Secondary | ICD-10-CM

## 2019-02-07 DIAGNOSIS — R8271 Bacteriuria: Secondary | ICD-10-CM | POA: Diagnosis present

## 2019-02-07 DIAGNOSIS — Z6841 Body Mass Index (BMI) 40.0 and over, adult: Secondary | ICD-10-CM

## 2019-02-07 DIAGNOSIS — Z79899 Other long term (current) drug therapy: Secondary | ICD-10-CM

## 2019-02-07 DIAGNOSIS — Z90710 Acquired absence of both cervix and uterus: Secondary | ICD-10-CM

## 2019-02-07 DIAGNOSIS — R7303 Prediabetes: Secondary | ICD-10-CM | POA: Diagnosis present

## 2019-02-07 DIAGNOSIS — F339 Major depressive disorder, recurrent, unspecified: Secondary | ICD-10-CM | POA: Diagnosis present

## 2019-02-07 DIAGNOSIS — Z8249 Family history of ischemic heart disease and other diseases of the circulatory system: Secondary | ICD-10-CM

## 2019-02-07 DIAGNOSIS — M170 Bilateral primary osteoarthritis of knee: Secondary | ICD-10-CM | POA: Diagnosis present

## 2019-02-07 DIAGNOSIS — Z7901 Long term (current) use of anticoagulants: Secondary | ICD-10-CM

## 2019-02-07 DIAGNOSIS — F411 Generalized anxiety disorder: Secondary | ICD-10-CM | POA: Diagnosis present

## 2019-02-07 DIAGNOSIS — Z20828 Contact with and (suspected) exposure to other viral communicable diseases: Secondary | ICD-10-CM | POA: Diagnosis present

## 2019-02-07 LAB — COMPREHENSIVE METABOLIC PANEL
ALT: 92 U/L — ABNORMAL HIGH (ref 0–44)
AST: 37 U/L (ref 15–41)
Albumin: 3.4 g/dL — ABNORMAL LOW (ref 3.5–5.0)
Alkaline Phosphatase: 87 U/L (ref 38–126)
Anion gap: 8 (ref 5–15)
BUN: 13 mg/dL (ref 8–23)
CO2: 22 mmol/L (ref 22–32)
Calcium: 8.4 mg/dL — ABNORMAL LOW (ref 8.9–10.3)
Chloride: 109 mmol/L (ref 98–111)
Creatinine, Ser: 0.74 mg/dL (ref 0.44–1.00)
GFR calc Af Amer: >60 mL/min (ref >60)
GFR calc non Af Amer: >60 mL/min (ref >60)
Glucose, Bld: 110 mg/dL — ABNORMAL HIGH (ref 70–99)
Potassium: 3.7 mmol/L (ref 3.5–5.1)
Sodium: 139 mmol/L (ref 135–145)
Total Bilirubin: 0.6 mg/dL (ref 0.3–1.2)
Total Protein: 6.7 g/dL (ref 6.5–8.1)

## 2019-02-07 LAB — CBC WITH DIFFERENTIAL/PLATELET
Abs Immature Granulocytes: 0.1 10*3/uL — ABNORMAL HIGH (ref 0.00–0.07)
Basophils Absolute: 0.1 10*3/uL (ref 0.0–0.1)
Basophils Relative: 0 %
Eosinophils Absolute: 0.1 10*3/uL (ref 0.0–0.5)
Eosinophils Relative: 0 %
HCT: 40.8 % (ref 36.0–46.0)
Hemoglobin: 13 g/dL (ref 12.0–15.0)
Immature Granulocytes: 1 %
Lymphocytes Relative: 18 %
Lymphs Abs: 3.1 10*3/uL (ref 0.7–4.0)
MCH: 29.5 pg (ref 26.0–34.0)
MCHC: 31.9 g/dL (ref 30.0–36.0)
MCV: 92.5 fL (ref 80.0–100.0)
Monocytes Absolute: 0.9 10*3/uL (ref 0.1–1.0)
Monocytes Relative: 5 %
Neutro Abs: 12.7 10*3/uL — ABNORMAL HIGH (ref 1.7–7.7)
Neutrophils Relative %: 76 %
Platelets: 374 10*3/uL (ref 150–400)
RBC: 4.41 MIL/uL (ref 3.87–5.11)
RDW: 15.9 % — ABNORMAL HIGH (ref 11.5–15.5)
WBC: 16.9 10*3/uL — ABNORMAL HIGH (ref 4.0–10.5)
nRBC: 0.1 % (ref 0.0–0.2)

## 2019-02-07 LAB — TROPONIN I (HIGH SENSITIVITY)
Troponin I (High Sensitivity): 5 ng/L (ref <18)
Troponin I (High Sensitivity): 5 ng/L (ref <18)
Troponin I (High Sensitivity): 5 ng/L (ref <18)

## 2019-02-07 LAB — GLUCOSE, CAPILLARY: Glucose-Capillary: 115 mg/dL — ABNORMAL HIGH (ref 70–99)

## 2019-02-07 LAB — SARS CORONAVIRUS 2 (TAT 6-24 HRS): SARS Coronavirus 2: NEGATIVE

## 2019-02-07 MED ORDER — ALBUTEROL SULFATE HFA 108 (90 BASE) MCG/ACT IN AERS
2.0000 | INHALATION_SPRAY | Freq: Four times a day (QID) | RESPIRATORY_TRACT | Status: DC | PRN
  Filled 2019-02-07: qty 6.7

## 2019-02-07 MED ORDER — ARMODAFINIL 150 MG PO TABS: 150.0000 mg | ORAL_TABLET | Freq: Every day | ORAL | Status: DC

## 2019-02-07 MED ORDER — SODIUM CHLORIDE 0.9 % IV BOLUS
1000.0000 mL | Freq: Once | INTRAVENOUS | Status: AC
  Administered 2019-02-07: 1000 mL via INTRAVENOUS

## 2019-02-07 MED ORDER — SODIUM CHLORIDE 0.9% FLUSH
3.0000 mL | Freq: Two times a day (BID) | INTRAVENOUS | Status: DC
  Administered 2019-02-07 – 2019-02-09 (×5): 3 mL via INTRAVENOUS

## 2019-02-07 MED ORDER — METOPROLOL TARTRATE 50 MG PO TABS
50.0000 mg | ORAL_TABLET | Freq: Two times a day (BID) | ORAL | Status: DC
  Administered 2019-02-07 – 2019-02-08 (×3): 50 mg via ORAL
  Filled 2019-02-07 (×3): qty 1

## 2019-02-07 MED ORDER — LORATADINE 10 MG PO TABS
10.0000 mg | ORAL_TABLET | Freq: Every day | ORAL | Status: DC
  Administered 2019-02-08 – 2019-02-09 (×2): 10 mg via ORAL
  Filled 2019-02-07 (×2): qty 1

## 2019-02-07 MED ORDER — LORATADINE 10 MG PO TABS
10.0000 mg | ORAL_TABLET | ORAL | Status: AC
  Administered 2019-02-07: 10 mg via ORAL
  Filled 2019-02-07: qty 1

## 2019-02-07 MED ORDER — ALBUTEROL SULFATE (2.5 MG/3ML) 0.083% IN NEBU: 2.5000 mg | INHALATION_SOLUTION | Freq: Four times a day (QID) | RESPIRATORY_TRACT | Status: DC | PRN

## 2019-02-07 MED ORDER — DILTIAZEM HCL 100 MG IV SOLR
5.0000 mg/h | INTRAVENOUS | Status: DC
  Administered 2019-02-08: 10 mg/h via INTRAVENOUS
  Filled 2019-02-07: qty 100

## 2019-02-07 MED ORDER — AZELASTINE HCL 0.1 % NA SOLN
1.0000 | Freq: Two times a day (BID) | NASAL | Status: DC
  Administered 2019-02-07 – 2019-02-09 (×4): 1 via NASAL
  Filled 2019-02-07: qty 30

## 2019-02-07 MED ORDER — APIXABAN 5 MG PO TABS
5.0000 mg | ORAL_TABLET | Freq: Two times a day (BID) | ORAL | Status: DC
  Administered 2019-02-07 – 2019-02-09 (×4): 5 mg via ORAL
  Filled 2019-02-07 (×4): qty 1

## 2019-02-07 MED ORDER — ACETAMINOPHEN 325 MG PO TABS
650.0000 mg | ORAL_TABLET | ORAL | Status: DC | PRN
  Administered 2019-02-07 – 2019-02-09 (×5): 650 mg via ORAL
  Filled 2019-02-07 (×5): qty 2

## 2019-02-07 MED ORDER — DILTIAZEM HCL 25 MG/5ML IV SOLN
10.0000 mg | Freq: Once | INTRAVENOUS | Status: AC
  Administered 2019-02-07: 10 mg via INTRAVENOUS

## 2019-02-07 MED ORDER — DILTIAZEM HCL 25 MG/5ML IV SOLN
10.0000 mg | Freq: Once | INTRAVENOUS | Status: AC
  Administered 2019-02-07: 10 mg via INTRAVENOUS
  Filled 2019-02-07: qty 5

## 2019-02-07 MED ORDER — SODIUM CHLORIDE 0.9 % IV SOLN: 250.0000 mL | INTRAVENOUS | Status: DC | PRN

## 2019-02-07 MED ORDER — DILTIAZEM HCL 100 MG IV SOLR
5.0000 mg/h | INTRAVENOUS | Status: DC
  Administered 2019-02-07: 10 mg/h via INTRAVENOUS
  Administered 2019-02-07: 5 mg/h via INTRAVENOUS
  Filled 2019-02-07 (×2): qty 100

## 2019-02-07 MED ORDER — ONDANSETRON HCL 4 MG/2ML IJ SOLN: 4.0000 mg | Freq: Four times a day (QID) | INTRAMUSCULAR | Status: DC | PRN

## 2019-02-07 MED ORDER — TRIAMCINOLONE ACETONIDE 55 MCG/ACT NA AERO
2.0000 | INHALATION_SPRAY | Freq: Every day | NASAL | Status: DC
  Administered 2019-02-08 – 2019-02-09 (×2): 2 via NASAL
  Filled 2019-02-07: qty 21.6

## 2019-02-07 MED ORDER — SODIUM CHLORIDE 0.9% FLUSH: 3.0000 mL | INTRAVENOUS | Status: DC | PRN

## 2019-02-07 MED ORDER — FAMOTIDINE 20 MG PO TABS
20.0000 mg | ORAL_TABLET | Freq: Every day | ORAL | Status: DC
  Administered 2019-02-08 – 2019-02-09 (×2): 20 mg via ORAL
  Filled 2019-02-07 (×2): qty 1

## 2019-02-07 MED ORDER — VENLAFAXINE HCL ER 75 MG PO CP24
75.0000 mg | ORAL_CAPSULE | Freq: Every day | ORAL | Status: DC
  Administered 2019-02-08 – 2019-02-09 (×2): 75 mg via ORAL
  Filled 2019-02-07 (×2): qty 1

## 2019-02-07 NOTE — ED Notes (Signed)
Taken to room 16 via wheelchair by ED Tech. Pt in NAD, RR even and unlabored. Exertional SOB. Reports she was recently dx with a fib and started on medications for, unsure if working.

## 2019-02-07 NOTE — Consult Note (Signed)
Cardiology Consultation:   Patient ID: Regina Christensen MRN: [id number]; DOB: 1955/12/21  Admit date: 02/07/2019 Date of Consult: 02/07/2019  Primary Care Provider: [name], MD Primary Cardiologist: Regina Odea, MD  Primary Electrophysiologist:  None    Patient Profile:   Regina Christensen is a 63 y.o. female with a hx of recently diagnosed atrial fibrillation with RVR on anticoagulation, HTN, obesity, sleep apnea who is being seen today for the evaluation of atrial fibrillation at the request of Dr. Tobi Christensen.  History of Present Illness:   Ms. Regina Christensen is a 63 yo female with PMH as above. She reports a significant family history of atrial fibrillation. She does not drink or smoke.   On 02/03/2019, she was seen in the clinic for report of palpitations and shortness of breath with PCP referring she consult cardiology for new onset atrial fibrillation.  EKG at the primary care physician's office showed atrial fibrillation with rapid ventricular response and ventricular rate of 150 bpm.  At that time, she reported dyspnea on exertion, especially when taking the stairs.  She also reported feelings of rapid heart rate.  However, she denied chest pain, presyncope, syncope.  She was started on Eliquis 5 mg twice daily, as well as Lopressor 25 mg twice daily. An echo was also ordered. She was to continue her amlodipine and olmesartan combination pill. Lifestyle changes were also recommended at that time.   On 02/07/2019, she presented to [facility] ED c/o worsening DOE, rapid heart rate, and chest pressure.  Patient reported that this chest pressure was located in the center of her chest.  It did not radiate or get worse with deep breaths or position.  She reported the chest pressure continued through the morning, ultimately subsiding with treatment in the ED of IV diltiazem and fluids. At the time of her cardiology consult, she was chest pain free. Of note, in the ED, HS Tn was negative and  EKG without acute ST/T changes. Labs significant for sodium 139, potassium 3.7, creatinine 0.74, BUN 13, AST 37, ALT 92, WBC 16.9, hemoglobin 13.0. CXR negative for acute cardiopulmonary dz.  Heart Pathway Score:     Past Medical History:  Diagnosis Date  . Arthritis   . Depression   . Hypertension   . Morbid obesity with BMI of 45.0-49.9, adult (HCC)   . Obesity   . Sleep apnea     Past Surgical History:  Procedure Laterality Date  . ABDOMINAL HYSTERECTOMY    . BREAST EXCISIONAL BIOPSY Left   . ENDOMETRIAL ABLATION     x2  . GASTRIC BYPASS     sleeve     Home Medications:  Prior to Admission medications   Medication Sig Start Date End Date Taking? Authorizing Provider  acetaminophen (TYLENOL 8 HOUR ARTHRITIS PAIN) 650 MG CR tablet Take 1 tablet (650 mg total) by mouth every 8 (eight) hours as needed for pain. Patient taking differently: Take 1,300 mg by mouth every 8 (eight) hours as needed for pain.  03/07/16  Yes Sowles, Danna Hefty, MD  amLODipine-olmesartan (AZOR) 5-40 MG tablet Take 1 tablet by mouth daily. 12/24/18  Yes Sowles, Danna Hefty, MD  apixaban (ELIQUIS) 5 MG TABS tablet Take 1 tablet (5 mg total) by mouth 2 (two) times daily. 02/03/19  Yes Agbor-Etang, Arlys John, MD  Armodafinil 150 MG tablet Take 1 tablet (150 mg total) by mouth daily. 12/24/18  Yes Sowles, Danna Hefty, MD  azelastine (ASTELIN) 0.1 % nasal spray PLACE 2 SPRAYS INTO BOTH NOSTRILS 2 (TWO) TIMES  DAILY 07/26/18  Yes Doren Custard, FNP  cloNIDine (CATAPRES - DOSED IN MG/24 HR) 0.1 mg/24hr patch PLACE 1 PATCH (0.1 MG TOTAL) ONTO THE SKIN ONCE A WEEK. 01/15/19  Yes Sowles, Danna Hefty, MD  fexofenadine (ALLEGRA) 180 MG tablet Take 180 mg by mouth daily.   Yes [provider]  metoprolol tartrate (LOPRESSOR) 25 MG tablet Take 1 tablet (25 mg total) by mouth 2 (two) times daily. 02/03/19 05/04/19 Yes Agbor-Etang, Arlys John, MD  nebivolol (BYSTOLIC) 10 MG tablet Take 1 tablet (10 mg total) by mouth daily. 12/24/18  Yes Sowles,  Danna Hefty, MD  Semaglutide, 1 MG/DOSE, (OZEMPIC, 1 MG/DOSE,) 2 MG/1.5ML SOPN Inject 1 mg into the skin once a week. 12/24/18  Yes Sowles, Danna Hefty, MD  triamcinolone (NASACORT) 55 MCG/ACT AERO nasal inhaler Place 2 sprays into the nose daily.   Yes [provider]  venlafaxine XR (EFFEXOR XR) 75 MG 24 hr capsule Take 1 capsule (75 mg total) by mouth daily with breakfast. To be combined with 150 mg 01/27/19  Yes Eappen, Levin Bacon, MD  venlafaxine XR (EFFEXOR-XR) 150 MG 24 hr capsule To be combined with 75 mg 01/27/19  Yes Eappen, Saramma, MD  XIIDRA 5 % SOLN Place 1 drop into both eyes 2 (two) times daily. 02/03/17  Yes [provider]  albuterol (PROVENTIL HFA;VENTOLIN HFA) 108 (90 Base) MCG/ACT inhaler TAKE 2 PUFFS BY MOUTH EVERY 6 HOURS AS NEEDED FOR WHEEZE OR SHORTNESS OF BREATH 08/12/17   Regina Cory, MD  famotidine (PEPCID) 20 MG tablet Take 1 tablet (20 mg total) by mouth daily. 12/29/18   Pasty Spillers, MD  Triamcinolone Acetonide (TRIAMCINOLONE 0.1 % CREAM : EUCERIN) CREA Apply 1 application topically 2 (two) times daily. 09/10/18   Regina Cory, MD  triamcinolone cream (KENALOG) 0.1 % APPLY TO AFFECTRED AREA TWICE A DAYT 12/13/18   Regina Cory, MD    Inpatient Medications: Scheduled Meds: . apixaban  5 mg Oral BID  . Armodafinil  150 mg Oral Daily  . azelastine  1 spray Each Nare BID  . [START ON 02/08/2019] famotidine  20 mg Oral Daily  . [START ON 02/08/2019] loratadine  10 mg Oral Daily  . metoprolol tartrate  50 mg Oral BID  . sodium chloride flush  3 mL Intravenous Q12H  . [START ON 02/08/2019] triamcinolone  2 spray Nasal Daily  . [START ON 02/08/2019] venlafaxine XR  75 mg Oral Q breakfast   Continuous Infusions: . sodium chloride    . diltiazem (CARDIZEM) infusion 15 mg/hr (02/07/19 1451)  . diltiazem (CARDIZEM) infusion     PRN Meds: sodium chloride, acetaminophen, albuterol, ondansetron (ZOFRAN) IV, sodium chloride flush  Allergies:     Allergies  Allergen Reactions  . Ace Inhibitors Cough  . Hctz [Hydrochlorothiazide] Rash    face    Social History:   Social History   Socioeconomic History  . Marital status: Divorced    Spouse name: Not on file  . Number of children: 2  . Years of education: Not on file  . Highest education level: Associate degree: occupational, Scientist, product/process development, or vocational program  Occupational History  . Occupation: patient Tax adviser: [employer]  Social Needs  . Financial resource strain: Not hard at all  . Food insecurity    Worry: Never true    Inability: Never true  . Transportation needs    Medical: No    Non-medical: No  Tobacco Use  . Smoking status: Never Smoker  . Smokeless tobacco:  Never Used  Substance and Sexual Activity  . Alcohol use: No    Alcohol/week: 0.0 standard drinks    Comment: rare  . Drug use: No  . Sexual activity: Never  Lifestyle  . Physical activity    Days per week: 0 days    Minutes per session: 0 min  . Stress: Rather much  Relationships  . Social connections    Talks on phone: More than three times a week    Gets together: More than three times a week    Attends religious service: More than 4 times per year    Active member of club or organization: No    Attends meetings of clubs or organizations: Never    Relationship status: Divorced  . Intimate partner violence    Fear of current or ex partner: No    Emotionally abused: No    Physically abused: No    Forced sexual activity: No  Other Topics Concern  . 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 Heart disease Father   . Cancer Brother   . Mental illness Neg Hx      ROS:  Please see the history of present illness.  Review of Systems  Constitutional: Negative for  diaphoresis.  Respiratory: Positive for shortness of breath. Negative for hemoptysis.        Shortness of breath with main complaint of dyspnea on exertion, especially when doing the stairs.  Cardiovascular: Positive for chest pain and palpitations. Negative for leg swelling.       Baseline chronic dependent edema.  Chest pressure improved with IV diltiazem.  No chest pressure at the time of cardiac examination. Feelings of racing heart rate and palpitations also improved.  Gastrointestinal: Negative for blood in stool, melena, nausea and vomiting.  Genitourinary: Negative for hematuria.  Musculoskeletal: Negative for falls.  Neurological: Negative for loss of consciousness.  All other systems reviewed and are negative.   All other ROS reviewed and negative.     Physical Exam/Data:   Vitals:   02/07/19 1339 02/07/19 1340 02/07/19 1341 02/07/19 1419  BP:    (!) 142/99  Pulse: 82 92 83 81  Resp: 16 (!) 22 (!) 24 20  Temp:    98.1 F (36.7 C)  TempSrc:    Oral  SpO2: 99% 97% 96% 97%  Weight:      Height:        Intake/Output Summary (Last 24 hours) at 02/07/2019 1537 Last data filed at 02/07/2019 1057 Gross per 24 hour  Intake 1000 ml  Output -  Net 1000 ml   Last 3 Weights 02/07/2019 02/03/2019 02/03/2019  Weight (lbs) 280 lb 290 lb 8 oz 284 lb  Weight (kg) 127.007 kg 131.77 kg 128.822 kg     Body mass index is 42.57 kg/m.  General: Obese female, in no acute distress.  Sitting on the edge of the bed. HEENT: normal Neck: no JVD Vascular: No carotid bruits; radial pulses 2+ bilaterally Cardiac:  normal S1, S2; IRIR; no murmur  Lungs:  clear to auscultation bilaterally, no wheezing, rhonchi or rales  Abd: soft, nontender, no hepatomegaly  Ext: minimal bilateral dependent edema Musculoskeletal:  No deformities, BUE and BLE  strength normal and equal Skin: warm and dry  Neuro:  No focal abnormalities noted Psych:  Normal affect   EKG:  The EKG was personally reviewed  and demonstrates:  Atrial fibrillation with rapid ventricular response, ventricular rate 148 bpm, left axis deviation, LVH, T wave inversion in inferior leads present on previous EKG Telemetry:  Telemetry was personally reviewed and demonstrates:  Atrial fibrillation with rapid ventricular response and rates into the 140s with movement.  Relevant CV Studies: Pending echo, reordered for inpatient  Laboratory Data:  High Sensitivity Troponin:   Recent Labs  Lab 02/07/19 0919 02/07/19 1257  TROPONINIHS 5 5     Cardiac EnzymesNo results for input(s): TROPONINI in the last 168 hours. No results for input(s): TROPIPOC in the last 168 hours.  Chemistry Recent Labs  Lab 02/03/19 1050 02/07/19 0919  NA 140 139  K 4.0 3.7  CL 104 109  CO2 26 22  GLUCOSE 120* 110*  BUN 13 13  CREATININE 0.75 0.74  CALCIUM 8.7* 8.4*  GFRNONAA >60 >60  GFRAA >60 >60  ANIONGAP 10 8    Recent Labs  Lab 02/03/19 1050 02/07/19 0919  PROT 6.7 6.7  ALBUMIN 3.6 3.4*  AST 17 37  ALT 33 92*  ALKPHOS 82 87  BILITOT 0.6 0.6   Hematology Recent Labs  Lab 02/03/19 1050 02/07/19 0919  WBC 15.6* 16.9*  RBC 4.33 4.41  HGB 12.8 13.0  HCT 39.3 40.8  MCV 90.8 92.5  MCH 29.6 29.5  MCHC 32.6 31.9  RDW 15.7* 15.9*  PLT 387 374   BNPNo results for input(s): BNP, PROBNP in the last 168 hours.  DDimer No results for input(s): DDIMER in the last 168 hours.   Radiology/Studies:  Dg Chest Portable 1 View  Result Date: 02/07/2019 CLINICAL DATA:  Chest pressure and shortness of breath. EXAM: PORTABLE CHEST 1 VIEW COMPARISON:  08/18/2015 FINDINGS: Top-normal heart size. Stable mild tortuosity of the thoracic aorta. There is no evidence of pulmonary edema, consolidation, pneumothorax, nodule or pleural fluid. IMPRESSION: Top-normal heart size.  No active disease. Electronically Signed   By: Irish Lack M.D.   On: 02/07/2019 10:03   Mm 3d Screen Breast Bilateral  Result Date: 02/07/2019 CLINICAL DATA:   Screening. EXAM: DIGITAL SCREENING BILATERAL MAMMOGRAM WITH TOMO AND CAD COMPARISON:  Previous exam(s). ACR Breast Density Category b: There are scattered areas of fibroglandular density. FINDINGS: There are no findings suspicious for malignancy. Images were processed with CAD. IMPRESSION: No mammographic evidence of malignancy. A result letter of this screening mammogram will be mailed directly to the patient. RECOMMENDATION: Screening mammogram in one year. (Code:SM-B-01Y) BI-RADS CATEGORY  1: Negative. Electronically Signed   By: Ted Mcalpine M.D.   On: 02/07/2019 11:38    Assessment and Plan:   Atrial fibrillation with rapid ventricular response --Newly diagnosed with clinic visit 10/15.  Patient reports she is symptomatic when in atrial fibrillation with feelings of racing heart rate, shortness of breath, and dyspnea on exertion. --Echo pending, reordered for inpatient.  --Started on Lopressor 25 mg twice daily and Eliquis 5 mg twice daily at her clinic appointment.  Patient reports that she has been taking these medications as prescribed.  She denies any signs or symptoms of bleeding on Eliquis. --Continue IV diltiazem (started in the ED) for rate control and wean as tolerated.  Restarted oral Lopressor at increased dose of 50 mg twice daily for further control of ventricular rates and blood pressure.  --Continue Eliquis 5 mg twice  daily for anticoagulation.  Patient has CHA2DS2VASc score of at least  2 (HTN, female) with borderline A1C as below. Recommendation for long-term anticoagulation. --Patient is not currently therapeutically anticoagulated, given she was started on Eliquis 10/15.  She has not missed any doses of her [place].  If patient remains symptomatic with difficult to control rates, will need to consider TEE/DCCV this admission.  Otherwise, do not recommend DCCV only given patient is not therapeutically anticoagulated.  Avoid IV amiodarone, as pharmacologic conversion still carries  risk of thrombosis. Further recommendations pending echo.  HTN --Continue IV diltiazem and wean as tolerated.  PTA oral Lopressor increased to 50 mg twice daily for further rate and BP control at this time.  Continue to hold remainder of antihypertensives at this time, including amlodipine-olmesartan, clonidine, and nebivolol. Will plan to restart as tolerated before discharge.  [place] DM2 --A1C 6.3 and borderline. Per IM, PCP.  Obesity --Lifestyle changes / weight loss recommended.  For questions or updates, please contact [place] Please consult [email or web address] for contact info under     Signed, Arvil Chaco, PA-C  02/07/2019 3:37 PM

## 2019-02-07 NOTE — Plan of Care (Signed)
  Problem: Education: Goal: Knowledge of General Education information will improve Description: Including pain rating scale, medication(s)/side effects and non-pharmacologic comfort measures Outcome: Progressing   Problem: Health Behavior/Discharge Planning: Goal: Ability to manage health-related needs will improve Outcome: Progressing   Problem: Pain Managment: Goal: General experience of comfort will improve Outcome: Progressing   Problem: Safety: Goal: Ability to remain free from injury will improve Outcome: Progressing   Problem: Education: Goal: Understanding of medication regimen will improve Outcome: Progressing   Problem: Activity: Goal: Risk for activity intolerance will decrease Outcome: Not Progressing Note: Patient complains of a headache with ambulation.

## 2019-02-07 NOTE — Plan of Care (Signed)
  Problem: Health Behavior/Discharge Planning: Goal: Ability to manage health-related needs will improve Outcome: Not Progressing Note: Patient currently on a Cardizem drip, maximum rate of 15 / hr. H.R. still about 108. Will continue to monitor. Wenda Low [facility]

## 2019-02-07 NOTE — H&P (Signed)
[facility] [place] at [facility]   PATIENT NAME: Regina HymenJosephine Christensen    MR#:  [id number]  DATE OF BIRTH:  04/21/1955  DATE OF ADMISSION:  02/07/2019  PRIMARY CARE PHYSICIAN: Alba CorySowles, Krichna, MD   REQUESTING/REFERRING PHYSICIAN:   CHIEF COMPLAINT:   Chief Complaint  Patient presents with  . Chest Pain  . Shortness of Breath    HISTORY OF PRESENT ILLNESS: Regina Christensen  is a 63 y.o. female with a known history of hypertension, sleep apnea, atrial fibrillation on Eliquis for anticoagulation, obesity presented to the emergency room for palpitations and heart racing.  Patient also felt some chest pressure.  She presented to the emergency room and had rate was around 130 to 140 bpm she was given IV Cardizem push.  Heart rate could not be controlled.  Patient was started on IV Cardizem drip and cardiology was consulted and hospitalist service was notified.  No fever chills cough myalgias.  COVID-19 test pending  PAST MEDICAL HISTORY:   Past Medical History:  Diagnosis Date  . Arthritis   . Depression   . Hypertension   . Morbid obesity with BMI of 45.0-49.9, adult (HCC)   . Obesity   . Sleep apnea   Atrial fibrillation  PAST SURGICAL HISTORY:  Past Surgical History:  Procedure Laterality Date  . ABDOMINAL HYSTERECTOMY    . BREAST EXCISIONAL BIOPSY Left   . ENDOMETRIAL ABLATION     x2  . GASTRIC BYPASS     sleeve    SOCIAL HISTORY:  Social History   Tobacco Use  . Smoking status: Never Smoker  . Smokeless tobacco: Never Used  Substance Use Topics  . Alcohol use: No    Alcohol/week: 0.0 standard drinks    Comment: rare    FAMILY HISTORY:  Family History  Problem Relation Age of Onset  . Heart disease Father   . Cancer Brother   . Mental illness Neg Hx     DRUG ALLERGIES:  Allergies  Allergen Reactions  . Ace Inhibitors Cough  . Hctz [Hydrochlorothiazide] Rash    face    REVIEW OF SYSTEMS:   CONSTITUTIONAL: No fever, fatigue  or weakness.  EYES: No blurred or double vision.  EARS, NOSE, AND THROAT: No tinnitus or ear pain.  RESPIRATORY: No cough, shortness of breath, wheezing or hemoptysis.  CARDIOVASCULAR: Had chest pressure, no orthopnea, edema.  Palpitations present GASTROINTESTINAL: No nausea, vomiting, diarrhea or abdominal pain.  GENITOURINARY: No dysuria, hematuria.  ENDOCRINE: No polyuria, nocturia,  HEMATOLOGY: No anemia, easy bruising or bleeding SKIN: No rash or lesion. MUSCULOSKELETAL: No joint pain or arthritis.   NEUROLOGIC: No tingling, numbness, weakness.  PSYCHIATRY: No anxiety or depression.   MEDICATIONS AT HOME:  Prior to Admission medications   Medication Sig Start Date End Date Taking? Authorizing Provider  acetaminophen (TYLENOL 8 HOUR ARTHRITIS PAIN) 650 MG CR tablet Take 1 tablet (650 mg total) by mouth every 8 (eight) hours as needed for pain. Patient taking differently: Take 1,300 mg by mouth every 8 (eight) hours as needed for pain.  03/07/16  Yes Sowles, Danna HeftyKrichna, MD  amLODipine-olmesartan (AZOR) 5-40 MG tablet Take 1 tablet by mouth daily. 12/24/18  Yes Sowles, Danna HeftyKrichna, MD  apixaban (ELIQUIS) 5 MG TABS tablet Take 1 tablet (5 mg total) by mouth 2 (two) times daily. 02/03/19  Yes Agbor-Etang, Arlys JohnBrian, MD  Armodafinil 150 MG tablet Take 1 tablet (150 mg total) by mouth daily. 12/24/18  Yes Sowles, Danna HeftyKrichna, MD  azelastine (ASTELIN) 0.1 %  nasal spray PLACE 2 SPRAYS INTO BOTH NOSTRILS 2 (TWO) TIMES DAILY 07/26/18  Yes Hubbard Hartshorn, FNP  cloNIDine (CATAPRES - DOSED IN MG/24 HR) 0.1 mg/24hr patch PLACE 1 PATCH (0.1 MG TOTAL) ONTO THE SKIN ONCE A WEEK. 01/15/19  Yes Sowles, Drue Stager, MD  fexofenadine (ALLEGRA) 180 MG tablet Take 180 mg by mouth daily.   Yes [provider]  metoprolol tartrate (LOPRESSOR) 25 MG tablet Take 1 tablet (25 mg total) by mouth 2 (two) times daily. 02/03/19 05/04/19 Yes Agbor-Etang, Aaron Edelman, MD  nebivolol (BYSTOLIC) 10 MG tablet Take 1 tablet (10 mg total) by mouth  daily. 12/24/18  Yes Sowles, Drue Stager, MD  Semaglutide, 1 MG/DOSE, (OZEMPIC, 1 MG/DOSE,) 2 MG/1.5ML SOPN Inject 1 mg into the skin once a week. 12/24/18  Yes Sowles, Drue Stager, MD  triamcinolone (NASACORT) 55 MCG/ACT AERO nasal inhaler Place 2 sprays into the nose daily.   Yes [provider]  venlafaxine XR (EFFEXOR XR) 75 MG 24 hr capsule Take 1 capsule (75 mg total) by mouth daily with breakfast. To be combined with 150 mg 01/27/19  Yes Eappen, Ria Clock, MD  venlafaxine XR (EFFEXOR-XR) 150 MG 24 hr capsule To be combined with 75 mg 01/27/19  Yes Eappen, Saramma, MD  XIIDRA 5 % SOLN Place 1 drop into both eyes 2 (two) times daily. 02/03/17  Yes [provider]  albuterol (PROVENTIL HFA;VENTOLIN HFA) 108 (90 Base) MCG/ACT inhaler TAKE 2 PUFFS BY MOUTH EVERY 6 HOURS AS NEEDED FOR WHEEZE OR SHORTNESS OF BREATH 08/12/17   Steele Sizer, MD  famotidine (PEPCID) 20 MG tablet Take 1 tablet (20 mg total) by mouth daily. 12/29/18   Virgel Manifold, MD  Triamcinolone Acetonide (TRIAMCINOLONE 0.1 % CREAM : EUCERIN) CREA Apply 1 application topically 2 (two) times daily. 09/10/18   Steele Sizer, MD  triamcinolone cream (KENALOG) 0.1 % APPLY TO AFFECTRED AREA TWICE A DAYT 12/13/18   Steele Sizer, MD      PHYSICAL EXAMINATION:   VITAL SIGNS: Blood pressure (!) 132/93, pulse 95, temperature 98.2 F (36.8 C), temperature source Oral, resp. rate (!) 35, height 5\' 8"  (1.727 m), weight 127 kg, SpO2 97 %.  GENERAL:  63 y.o.-year-old patient lying in the bed with no acute distress.  EYES: Pupils equal, round, reactive to light and accommodation. No scleral icterus. Extraocular muscles intact.  HEENT: Head atraumatic, normocephalic. Oropharynx and nasopharynx clear.  NECK:  Supple, no jugular venous distention. No thyroid enlargement, no tenderness.  LUNGS: Normal breath sounds bilaterally, no wheezing, rales,rhonchi or crepitation. No use of accessory muscles of respiration.  CARDIOVASCULAR: S1,  S2 irregular. No murmurs, rubs, or gallops.  ABDOMEN: Soft, nontender, nondistended. Bowel sounds present. No organomegaly or mass.  EXTREMITIES: No pedal edema, cyanosis, or clubbing.  NEUROLOGIC: Cranial nerves II through XII are intact. Muscle strength 5/5 in all extremities. Sensation intact. Gait not checked.  PSYCHIATRIC: The patient is alert and oriented x 3.  SKIN: No obvious rash, lesion, or ulcer.   LABORATORY PANEL:   CBC Recent Labs  Lab 02/03/19 1050 02/07/19 0919  WBC 15.6* 16.9*  HGB 12.8 13.0  HCT 39.3 40.8  PLT 387 374  MCV 90.8 92.5  MCH 29.6 29.5  MCHC 32.6 31.9  RDW 15.7* 15.9*  LYMPHSABS 2.8 3.1  MONOABS 1.1* 0.9  EOSABS 0.1 0.1  BASOSABS 0.1 0.1   ------------------------------------------------------------------------------------------------------------------  Chemistries  Recent Labs  Lab 02/03/19 1050 02/07/19 0919  NA 140 139  K 4.0 3.7  CL 104 109  CO2 26 22  GLUCOSE 120* 110*  BUN 13 13  CREATININE 0.75 0.74  CALCIUM 8.7* 8.4*  AST 17 37  ALT 33 92*  ALKPHOS 82 87  BILITOT 0.6 0.6   ------------------------------------------------------------------------------------------------------------------ estimated creatinine clearance is 102.6 mL/min (by C-G formula based on SCr of 0.74 mg/dL). ------------------------------------------------------------------------------------------------------------------ No results for input(s): TSH, T4TOTAL, T3FREE, THYROIDAB in the last 72 hours.  Invalid input(s): FREET3   Coagulation profile No results for input(s): INR, PROTIME in the last 168 hours. ------------------------------------------------------------------------------------------------------------------- No results for input(s): DDIMER in the last 72 hours. -------------------------------------------------------------------------------------------------------------------  Cardiac Enzymes No results for input(s): CKMB, TROPONINI,  MYOGLOBIN in the last 168 hours.  Invalid input(s): CK ------------------------------------------------------------------------------------------------------------------ Invalid input(s): POCBNP  ---------------------------------------------------------------------------------------------------------------  Urinalysis    Component Value Date/Time   COLORURINE Yellow 07/11/2012 0413   APPEARANCEUR Clear 07/11/2012 0413   LABSPEC 1.027 07/11/2012 0413   PHURINE 6.0 07/11/2012 0413   GLUCOSEU Negative 07/11/2012 0413   HGBUR 1+ 07/11/2012 0413   BILIRUBINUR negative 10/23/2016 0835   BILIRUBINUR Negative 07/11/2012 0413   KETONESUR Negative 07/11/2012 0413   PROTEINUR 30 10/23/2016 0835   PROTEINUR Negative 07/11/2012 0413   UROBILINOGEN 1.0 10/23/2016 0835   NITRITE negative 10/23/2016 0835   NITRITE Negative 07/11/2012 0413   LEUKOCYTESUR Moderate (2+) (A) 10/23/2016 0835   LEUKOCYTESUR Negative 07/11/2012 0413     RADIOLOGY: Dg Chest Portable 1 View  Result Date: 02/07/2019 CLINICAL DATA:  Chest pressure and shortness of breath. EXAM: PORTABLE CHEST 1 VIEW COMPARISON:  08/18/2015 FINDINGS: Top-normal heart size. Stable mild tortuosity of the thoracic aorta. There is no evidence of pulmonary edema, consolidation, pneumothorax, nodule or pleural fluid. IMPRESSION: Top-normal heart size.  No active disease. Electronically Signed   By: Irish Lack M.D.   On: 02/07/2019 10:03    EKG: Orders placed or performed during the hospital encounter of 02/07/19  . ED EKG  . ED EKG    IMPRESSION AND PLAN: 63 year old female patient with a known history of hypertension, sleep apnea, atrial fibrillation on Eliquis for anticoagulation, obesity presented to the emergency room for palpitations and heart racing.  Patient also felt some chest pressure.  She presented to the emergency room and had rate was around 130 to 140 bpm she was given IV Cardizem push.    -Atrial fibrillation with  rapid rate Admit patient to telemetry IV Cardizem drip Cardiology consult Restart Eliquis for anticoagulation Cycle troponin rule out ischemia  -Leukocytosis No signs of infection Monitor WBC count  -Hypertension On Cardizem for blood pressure control Will restart clonidine once weaned off Cardizem drip  -DVT prophylaxis On anticoagulation with Eliquis  All the records are reviewed and case discussed with ED provider. Management plans discussed with the patient, family and they are in agreement.  CODE STATUS:Full code Code Status History    Date Active Date Inactive Code Status Order ID Comments User Context   08/18/2015 2000 08/19/2015 1708 Full Code [id number]  Katha Hamming, MD ED   Advance Care Planning Activity       TOTAL TIME TAKING CARE OF THIS PATIENT: 52 minutes.    Ihor Austin M.D on 02/07/2019 at 12:45 PM  Between 7am to 6pm - Pager - [phone]  After 6pm go to [email or web address] - password [email or web address] [facility]  [facility]  Office  [phone]  CC: Primary care physician; [name], MD

## 2019-02-07 NOTE — ED Triage Notes (Signed)
Pt reports chest pressure and SOB for the past few days. Denies fevers, cough, congestion, etc.

## 2019-02-07 NOTE — ED Provider Notes (Signed)
[facility] Emergency Department Provider Note  Time seen: 9:33 AM  I have reviewed the triage vital signs and the nursing notes.   HISTORY  Chief Complaint Chest Pain and Shortness of Breath    HPI Regina Christensen is a 63 y.o. female with a past medical history of arthritis, depression, hypertension, obesity, recently diagnosed atrial fibrillation on Eliquis and metoprolol presents to the emergency department for chest discomfort and rapid heartbeat.  According to the patient for the past week or so she has been feeling short of breath especially with exertion has been experiencing what she describes as "hot flashes."  Patient went to her PCP Thursday and was diagnosed with new onset atrial fibrillation and was referred to cardiology the same day was started on metoprolol and Eliquis.  Patient states she has been feeling well since then has been experiencing intermittent palpitations.  Since last night however the patient has been feeling a sensation of rapid heartbeat and tightness or pressure in the center of her chest.  Denies any "chest pain."  Patient denies any fever cough or congestion.   Past Medical History:  Diagnosis Date  . Arthritis   . Depression   . Hypertension   . Morbid obesity with BMI of 45.0-49.9, adult (HCC)   . Obesity   . Sleep apnea     Patient Active Problem List   Diagnosis Date Noted  . Atrial fibrillation, new onset (HCC) 02/03/2019  . Bilateral carpal tunnel syndrome 07/30/2016  . OSA (obstructive sleep apnea) 03/07/2016  . MDD (major depressive disorder), recurrent episode, mild (HCC) 11/30/2015  . Osteoarthritis of both knees 11/30/2015  . BPPV (benign paroxysmal positional vertigo) 08/28/2015  . Essential hypertension 11/09/2014  . Hyperglycemia 11/09/2014  . GAD (generalized anxiety disorder) 11/09/2014  . Acanthosis nigricans 11/09/2014  . Morbid obesity (HCC) 01/06/2012    Past Surgical History:  Procedure  Laterality Date  . ABDOMINAL HYSTERECTOMY    . BREAST EXCISIONAL BIOPSY Left   . ENDOMETRIAL ABLATION     x2  . GASTRIC BYPASS     sleeve    Prior to Admission medications   Medication Sig Start Date End Date Taking? Authorizing Provider  acetaminophen (TYLENOL 8 HOUR ARTHRITIS PAIN) 650 MG CR tablet Take 1 tablet (650 mg total) by mouth every 8 (eight) hours as needed for pain. Patient taking differently: Take 1,300 mg by mouth every 8 (eight) hours as needed for pain.  03/07/16   [name], MD  albuterol (PROVENTIL HFA;VENTOLIN HFA) 108 (90 Base) MCG/ACT inhaler TAKE 2 PUFFS BY MOUTH EVERY 6 HOURS AS NEEDED FOR WHEEZE OR SHORTNESS OF BREATH 08/12/17   Carlynn Purl, Danna Hefty, MD  amLODipine-olmesartan (AZOR) 5-40 MG tablet Take 1 tablet by mouth daily. 12/24/18   [name], MD  apixaban (ELIQUIS) 5 MG TABS tablet Take 1 tablet (5 mg total) by mouth 2 (two) times daily. 02/03/19   Debbe Odea, MD  Armodafinil 150 MG tablet Take 1 tablet (150 mg total) by mouth daily. 12/24/18   [name], MD  azelastine (ASTELIN) 0.1 % nasal spray PLACE 2 SPRAYS INTO BOTH NOSTRILS 2 (TWO) TIMES DAILY 07/26/18   Doren Custard, FNP  cloNIDine (CATAPRES - DOSED IN MG/24 HR) 0.1 mg/24hr patch PLACE 1 PATCH (0.1 MG TOTAL) ONTO THE SKIN ONCE A WEEK. 01/15/19   [name], MD  famotidine (PEPCID) 20 MG tablet Take 1 tablet (20 mg total) by mouth daily. 12/29/18   Pasty Spillers, MD  fexofenadine Joyce Copa)  180 MG tablet Take 180 mg by mouth daily.    [provider]  metoprolol tartrate (LOPRESSOR) 25 MG tablet Take 1 tablet (25 mg total) by mouth 2 (two) times daily. 02/03/19 05/04/19  Debbe OdeaAgbor-Etang, Brian, MD  nebivolol (BYSTOLIC) 10 MG tablet Take 1 tablet (10 mg total) by mouth daily. 12/24/18   Alba CorySowles, Krichna, MD  Semaglutide, 1 MG/DOSE, (OZEMPIC, 1 MG/DOSE,) 2 MG/1.5ML SOPN Inject 1 mg into the skin once a week. 12/24/18   Alba CorySowles, Krichna, MD  triamcinolone (NASACORT) 55 MCG/ACT AERO  nasal inhaler Place 2 sprays into the nose daily.    [provider]  Triamcinolone Acetonide (TRIAMCINOLONE 0.1 % CREAM : EUCERIN) CREA Apply 1 application topically 2 (two) times daily. 09/10/18   Alba CorySowles, Krichna, MD  triamcinolone cream (KENALOG) 0.1 % APPLY TO AFFECTRED AREA TWICE A DAYT 12/13/18   Carlynn PurlSowles, Danna HeftyKrichna, MD  venlafaxine XR (EFFEXOR XR) 75 MG 24 hr capsule Take 1 capsule (75 mg total) by mouth daily with breakfast. To be combined with 150 mg 01/27/19   Jomarie LongsEappen, Saramma, MD  venlafaxine XR (EFFEXOR-XR) 150 MG 24 hr capsule To be combined with 75 mg 01/27/19   Jomarie LongsEappen, Saramma, MD  XIIDRA 5 % SOLN Place 1 drop into both eyes 2 (two) times daily. 02/03/17   [provider]    Allergies  Allergen Reactions  . Ace Inhibitors Cough  . Hctz [Hydrochlorothiazide] Rash    face    Family History  Problem Relation Age of Onset  . Heart disease Father   . Cancer Brother   . Mental illness Neg Hx     Social History Social History   Tobacco Use  . Smoking status: Never Smoker  . Smokeless tobacco: Never Used  Substance Use Topics  . Alcohol use: No    Alcohol/week: 0.0 standard drinks    Comment: rare  . Drug use: No    Review of Systems Constitutional: Negative for fever. Cardiovascular: Mild chest pressure.  Positive for palpitations. Respiratory: Negative for shortness of breath.  Negative for cough. Gastrointestinal: Negative for abdominal pai Musculoskeletal: Negative for musculoskeletal complaints Neurological: Negative for headache All other ROS negative  ____________________________________________   PHYSICAL EXAM:  VITAL SIGNS: ED Triage Vitals  Enc Vitals Group     BP 02/07/19 0917 (!) 147/95     Pulse Rate 02/07/19 0917 (!) 144     Resp 02/07/19 0917 20     Temp 02/07/19 0917 98.2 F (36.8 C)     Temp Source 02/07/19 0917 Oral     SpO2 02/07/19 0917 94 %     Weight 02/07/19 0907 280 lb (127 kg)     Height 02/07/19 0907 5\' 8"  (1.727 m)      Head Circumference --      Peak Flow --      Pain Score 02/07/19 0907 5     Pain Loc --      Pain Edu? --      Excl. in GC? --    Constitutional: Alert and oriented. Well appearing and in no distress. Eyes: Normal exam ENT      Head: Normocephalic and atraumatic.      Mouth/Throat: Mucous membranes are moist. Cardiovascular: Normal rate, regular rhythm.  Respiratory: Normal respiratory effort without tachypnea nor retractions. Breath sounds are clear Gastrointestinal: Soft and nontender. No distention.  Musculoskeletal: Nontender with normal range of motion in all extremities.  Neurologic:  Normal speech and language. No gross focal neurologic deficits Skin:  Skin is warm, dry and intact.  Psychiatric: Mood and affect are normal.   ____________________________________________    EKG  EKG viewed and interpreted by myself shows atrial fibrillation with rapid ventricular response at 148 bpm with a narrow QRS, normal axis, largely normal intervals nonspecific ST changes without ST elevation.  ____________________________________________    RADIOLOGY  Chest x-ray is negative.  ____________________________________________   INITIAL IMPRESSION / ASSESSMENT AND PLAN / ED COURSE  Pertinent labs & imaging results that were available during my care of the patient were reviewed by me and considered in my medical decision making (see chart for details).   Patient presents to the emergency department with palpitations chest pressure/tightness.  Recently diagnosed with atrial fibrillation 4 days ago.  Patient is currently anticoagulated with Eliquis rate controlled with metoprolol.  Is a patient is currently in A. fib with RVR heart rate in the 160s during my evaluation, we will check labs including cardiac enzymes, dose IV diltiazem, fluids and continue to closely monitor.  Patient agreeable to plan of care.  Patient's labs are resulted largely within normal limits including a  negative troponin.  Reassuring chest x-ray.  Patient received 2 doses of IV diltiazem heart rate reduced from 160s down to approximately 110-130.  Continued to creep up so the patient was placed on infusion of diltiazem.  I discussed patient with Dr. Fletcher Anon of cardiology.  Patient will be admitted to the hospital service.  Regina Christensen was evaluated in Emergency Department on 02/07/2019 for the symptoms described in the history of present illness. She was evaluated in the context of the global COVID-19 pandemic, which necessitated consideration that the patient might be at risk for infection with the SARS-CoV-2 virus that causes COVID-19. Institutional protocols and algorithms that pertain to the evaluation of patients at risk for COVID-19 are in a state of rapid change based on information released by regulatory bodies including the CDC and federal and state organizations. These policies and algorithms were followed during the patient's care in the ED.  CRITICAL CARE Performed by: Harvest Dark   Total critical care time: 30 minutes  Critical care time was exclusive of separately billable procedures and treating other patients.  Critical care was necessary to treat or prevent imminent or life-threatening deterioration.  Critical care was time spent personally by me on the following activities: development of treatment plan with patient and/or surrogate as well as nursing, discussions with consultants, evaluation of patient's response to treatment, examination of patient, obtaining history from patient or surrogate, ordering and performing treatments and interventions, ordering and review of laboratory studies, ordering and review of radiographic studies, pulse oximetry and re-evaluation of patient's condition.   ____________________________________________   FINAL CLINICAL IMPRESSION(S) / ED DIAGNOSES  Atrial fibrillation with rapid ventricular response   Harvest Dark,  MD 02/07/19 1214

## 2019-02-07 NOTE — Progress Notes (Signed)
Advanced care plan.  Purpose of the Encounter: CODE STATUS  Parties in Attendance: Patient  Patient's Decision Capacity: Good  Subjective/Patient's story: 63 y.o. female with a known history of hypertension, sleep apnea, atrial fibrillation on Eliquis for anticoagulation, obesity presented to the emergency room for palpitations and heart racing.  Patient also felt some chest pressure.  She presented to the emergency room and had rate was around 130 to 140 bpm she was given IV Cardizem push.  Heart rate could not be controlled.  Patient was started on IV Cardizem drip and cardiology was consulted and hospitalist service was notified.  No fever chills cough myalgias.  COVID-19 test pending   Objective/Medical story Patient needs IV Cardizem drip for rate control Needs anticoagulation with Eliquis and cardiology evaluation   Goals of care determination:  Advance care directives goals of care treatment plan discussed Patient wants everything done which includes CPR, intubation ventilator if the need arises   CODE STATUS: Full code   Time spent discussing advanced care planning: 16 minutes

## 2019-02-08 ENCOUNTER — Observation Stay (HOSPITAL_COMMUNITY)
Admit: 2019-02-08 | Discharge: 2019-02-08 | Disposition: A | Discharge status: Home / Self Care | Payer: BC Managed Care – PPO | Attending: Physician Assistant | Admitting: Physician Assistant

## 2019-02-08 ENCOUNTER — Telehealth: Payer: Self-pay | Admitting: Cardiology

## 2019-02-08 DIAGNOSIS — D72829 Elevated white blood cell count, unspecified: Secondary | ICD-10-CM | POA: Diagnosis present

## 2019-02-08 DIAGNOSIS — R079 Chest pain, unspecified: Secondary | ICD-10-CM

## 2019-02-08 DIAGNOSIS — I4891 Unspecified atrial fibrillation: Secondary | ICD-10-CM | POA: Diagnosis present

## 2019-02-08 DIAGNOSIS — R7303 Prediabetes: Secondary | ICD-10-CM | POA: Diagnosis present

## 2019-02-08 DIAGNOSIS — R8271 Bacteriuria: Secondary | ICD-10-CM | POA: Diagnosis present

## 2019-02-08 DIAGNOSIS — I340 Nonrheumatic mitral (valve) insufficiency: Secondary | ICD-10-CM | POA: Diagnosis not present

## 2019-02-08 DIAGNOSIS — G4733 Obstructive sleep apnea (adult) (pediatric): Secondary | ICD-10-CM | POA: Diagnosis present

## 2019-02-08 DIAGNOSIS — I480 Paroxysmal atrial fibrillation: Secondary | ICD-10-CM | POA: Diagnosis not present

## 2019-02-08 DIAGNOSIS — F411 Generalized anxiety disorder: Secondary | ICD-10-CM | POA: Diagnosis present

## 2019-02-08 DIAGNOSIS — I361 Nonrheumatic tricuspid (valve) insufficiency: Secondary | ICD-10-CM

## 2019-02-08 DIAGNOSIS — Z20828 Contact with and (suspected) exposure to other viral communicable diseases: Secondary | ICD-10-CM | POA: Diagnosis present

## 2019-02-08 DIAGNOSIS — Z9884 Bariatric surgery status: Secondary | ICD-10-CM | POA: Diagnosis not present

## 2019-02-08 DIAGNOSIS — Z79899 Other long term (current) drug therapy: Secondary | ICD-10-CM | POA: Diagnosis not present

## 2019-02-08 DIAGNOSIS — I119 Hypertensive heart disease without heart failure: Secondary | ICD-10-CM | POA: Diagnosis present

## 2019-02-08 DIAGNOSIS — Z90710 Acquired absence of both cervix and uterus: Secondary | ICD-10-CM | POA: Diagnosis not present

## 2019-02-08 DIAGNOSIS — Z888 Allergy status to other drugs, medicaments and biological substances status: Secondary | ICD-10-CM | POA: Diagnosis not present

## 2019-02-08 DIAGNOSIS — Z7901 Long term (current) use of anticoagulants: Secondary | ICD-10-CM | POA: Diagnosis not present

## 2019-02-08 DIAGNOSIS — R002 Palpitations: Secondary | ICD-10-CM | POA: Diagnosis present

## 2019-02-08 DIAGNOSIS — Z6841 Body Mass Index (BMI) 40.0 and over, adult: Secondary | ICD-10-CM | POA: Diagnosis not present

## 2019-02-08 DIAGNOSIS — M170 Bilateral primary osteoarthritis of knee: Secondary | ICD-10-CM | POA: Diagnosis present

## 2019-02-08 DIAGNOSIS — I4819 Other persistent atrial fibrillation: Secondary | ICD-10-CM | POA: Diagnosis present

## 2019-02-08 DIAGNOSIS — F339 Major depressive disorder, recurrent, unspecified: Secondary | ICD-10-CM | POA: Diagnosis present

## 2019-02-08 DIAGNOSIS — Z8249 Family history of ischemic heart disease and other diseases of the circulatory system: Secondary | ICD-10-CM | POA: Diagnosis not present

## 2019-02-08 LAB — ECHOCARDIOGRAM COMPLETE
Height: 68 in
Weight: 4739.2 oz

## 2019-02-08 LAB — BASIC METABOLIC PANEL
Anion gap: 5 (ref 5–15)
BUN: 14 mg/dL (ref 8–23)
CO2: 25 mmol/L (ref 22–32)
Calcium: 8.5 mg/dL — ABNORMAL LOW (ref 8.9–10.3)
Chloride: 109 mmol/L (ref 98–111)
Creatinine, Ser: 0.76 mg/dL (ref 0.44–1.00)
GFR calc Af Amer: >60 mL/min (ref >60)
GFR calc non Af Amer: >60 mL/min (ref >60)
Glucose, Bld: 119 mg/dL — ABNORMAL HIGH (ref 70–99)
Potassium: 3.5 mmol/L (ref 3.5–5.1)
Sodium: 139 mmol/L (ref 135–145)

## 2019-02-08 LAB — LIPID PANEL
Cholesterol: 107 mg/dL (ref 0–200)
HDL: 49 mg/dL (ref >40)
LDL Cholesterol: 30 mg/dL (ref 0–99)
Total CHOL/HDL Ratio: 2.2 RATIO
Triglycerides: 139 mg/dL (ref <150)
VLDL: 28 mg/dL (ref 0–40)

## 2019-02-08 LAB — URINALYSIS, COMPLETE (UACMP) WITH MICROSCOPIC
Bilirubin Urine: NEGATIVE
Glucose, UA: NEGATIVE mg/dL
Hgb urine dipstick: NEGATIVE
Ketones, ur: NEGATIVE mg/dL
Leukocytes,Ua: NEGATIVE
Nitrite: NEGATIVE
Protein, ur: NEGATIVE mg/dL
Specific Gravity, Urine: 1.009 (ref 1.005–1.030)
pH: 6 (ref 5.0–8.0)

## 2019-02-08 LAB — HIV ANTIBODY (ROUTINE TESTING W REFLEX): HIV Screen 4th Generation wRfx: NONREACTIVE

## 2019-02-08 LAB — GLUCOSE, CAPILLARY
Glucose-Capillary: 127 mg/dL — ABNORMAL HIGH (ref 70–99)
Glucose-Capillary: 85 mg/dL (ref 70–99)

## 2019-02-08 MED ORDER — METHOCARBAMOL 500 MG PO TABS
500.0000 mg | ORAL_TABLET | Freq: Three times a day (TID) | ORAL | Status: DC | PRN
  Administered 2019-02-08: 500 mg via ORAL
  Filled 2019-02-08 (×2): qty 1

## 2019-02-08 MED ORDER — METOPROLOL TARTRATE 50 MG PO TABS
100.0000 mg | ORAL_TABLET | Freq: Two times a day (BID) | ORAL | Status: DC
  Administered 2019-02-08 – 2019-02-09 (×2): 100 mg via ORAL
  Filled 2019-02-08 (×2): qty 2

## 2019-02-08 MED ORDER — PERFLUTREN LIPID MICROSPHERE
1.0000 mL | INTRAVENOUS | Status: AC | PRN
  Administered 2019-02-08: 2 mL via INTRAVENOUS
  Filled 2019-02-08: qty 10

## 2019-02-08 MED ORDER — METOPROLOL TARTRATE 25 MG PO TABS
25.0000 mg | ORAL_TABLET | Freq: Once | ORAL | Status: AC
  Administered 2019-02-08: 25 mg via ORAL
  Filled 2019-02-08: qty 1

## 2019-02-08 MED ORDER — OXYCODONE-ACETAMINOPHEN 5-325 MG PO TABS
1.0000 | ORAL_TABLET | Freq: Four times a day (QID) | ORAL | Status: DC | PRN
  Administered 2019-02-08: 1 via ORAL
  Filled 2019-02-08: qty 1

## 2019-02-08 NOTE — Telephone Encounter (Signed)
TCM....  Patient is being discharged   They saw End for Afib RVR  They are scheduled to see Agbor 10/27 at 9 am     They need to be seen within ~1 week   Please call  If tcm not sure from [facility]

## 2019-02-08 NOTE — Progress Notes (Signed)
[facility] - [place] at [facility]   PATIENT NAME: Regina Christensen    MR#:  [id number]  DATE OF BIRTH:  1956/01/26  SUBJECTIVE:  CHIEF COMPLAINT:  Pt is feeling better today.  Denies any palpitations or shortness of breath but reporting right flank pain and pain in the right anterior chest wall rib area.  Denies any dysuria or burning micturition.  Denies any abdominal discomfort  REVIEW OF SYSTEMS:  CONSTITUTIONAL: No fever, fatigue or weakness.  EYES: No blurred or double vision.  EARS, NOSE, AND THROAT: No tinnitus or ear pain.  RESPIRATORY: No cough, shortness of breath, wheezing or hemoptysis.  CARDIOVASCULAR: No chest pain, orthopnea, edema.  GASTROINTESTINAL: No nausea, vomiting, diarrhea or abdominal pain.  GENITOURINARY: No dysuria, hematuria.  ENDOCRINE: No polyuria, nocturia HEMATOLOGY: No anemia, easy bruising or bleeding SKIN: No rash or lesion. MUSCULOSKELETAL: No joint pain or arthritis.  Reporting right flank pain NEUROLOGIC: No tingling, numbness, weakness.  PSYCHIATRY: No anxiety or depression.   DRUG ALLERGIES:   Allergies  Allergen Reactions  . Ace Inhibitors Cough  . Hctz [Hydrochlorothiazide] Rash    face    VITALS:  Blood pressure (!) 127/97, pulse 99, temperature 98 F (36.7 C), temperature source Oral, resp. rate 16, height 5\' 8"  (1.727 m), weight 134.4 kg, SpO2 95 %.  PHYSICAL EXAMINATION:  GENERAL:  63 y.o.-year-old patient lying in the bed with no acute distress.  EYES: Pupils equal, round, reactive to light and accommodation. No scleral icterus. Extraocular muscles intact.  HEENT: Head atraumatic, normocephalic. Oropharynx and nasopharynx clear.  NECK:  Supple, no jugular venous distention. No thyroid enlargement, no tenderness.  LUNGS: Normal breath sounds bilaterally, no wheezing, rales,rhonchi or crepitation. No use of accessory muscles of respiration.  Right lower rib tenderness CARDIOVASCULAR: S1, S2 normal. No  murmurs, rubs, or gallops.  ABDOMEN: Soft, nontender, nondistended. Bowel sounds present.  EXTREMITIES: No pedal edema, cyanosis, or clubbing.  NEUROLOGIC: Cranial nerves II through XII are intact. Muscle strength 5/5 in all extremities. Sensation intact. Gait not checked.  PSYCHIATRIC: The patient is alert and oriented x 3.  SKIN: No obvious rash, lesion, or ulcer.    LABORATORY PANEL:   CBC Recent Labs  Lab 02/07/19 0919  WBC 16.9*  HGB 13.0  HCT 40.8  PLT 374   ------------------------------------------------------------------------------------------------------------------  Chemistries  Recent Labs  Lab 02/07/19 0919 02/08/19 0513  NA 139 139  K 3.7 3.5  CL 109 109  CO2 22 25  GLUCOSE 110* 119*  BUN 13 14  CREATININE 0.74 0.76  CALCIUM 8.4* 8.5*  AST 37  --   ALT 92*  --   ALKPHOS 87  --   BILITOT 0.6  --    ------------------------------------------------------------------------------------------------------------------  Cardiac Enzymes No results for input(s): TROPONINI in the last 168 hours. ------------------------------------------------------------------------------------------------------------------  RADIOLOGY:  Dg Chest Portable 1 View  Result Date: 02/07/2019 CLINICAL DATA:  Chest pressure and shortness of breath. EXAM: PORTABLE CHEST 1 VIEW COMPARISON:  08/18/2015 FINDINGS: Top-normal heart size. Stable mild tortuosity of the thoracic aorta. There is no evidence of pulmonary edema, consolidation, pneumothorax, nodule or pleural fluid. IMPRESSION: Top-normal heart size.  No active disease. Electronically Signed   By: 08/20/2015 M.D.   On: 02/07/2019 10:03    EKG:   Orders placed or performed during the hospital encounter of 02/07/19  . ED EKG  . ED EKG    ASSESSMENT AND PLAN:   63 year old female patient with a known history of hypertension, sleep  apnea, atrial fibrillation on Eliquis for anticoagulation, obesity presented to the  emergency room for palpitations and heart racing.  Patient also felt some chest pressure.  She presented to the emergency room and had rate was around 130 to 140 bpm she was given IV Cardizem push.    -Atrial fibrillation with rapid rate Patient improved with Cardizem drip and converted to sinus rhythm Discontinue Cardizem drip and titrate metoprolol dosing Cardiology consult-follow-up with [facility]  Eliquis for anticoagulation Nontrending troponins  -Leukocytosis with right flank pain and urinalysis with bacteria We will check urine culture and sensitivity and hold off on antibiotics at this time  -Right anterior chest wall pain-musculoskeletal pain Symptomatic treatment with K pad and muscle relaxants Tylenol as needed   -Hypertension Holding home medication clonidine and patient is on metoprolol 50 mg will titrate as needed  -DVT prophylaxis On anticoagulation with Eliquis     All the records are reviewed and case discussed with Care Management/Social Workerr. Management plans discussed with the patient, family and they are in agreement.  CODE STATUS: fc   TOTAL TIME TAKING CARE OF THIS PATIENT: 36  minutes.   POSSIBLE D/C IN 1  DAYS, DEPENDING ON CLINICAL CONDITION.  Note: This dictation was prepared with Dragon dictation along with smaller phrase technology. Any transcriptional errors that result from this process are unintentional.   Nicholes Mango M.D on 02/08/2019 at 1:04 PM  Between 7am to 6pm - Pager - [phone] After 6pm go to [email or web address] - password [email or web address] [facility]  [place] [facility]  Office  [phone]  CC: Primary care physician; Steele Sizer, MD

## 2019-02-08 NOTE — Evaluation (Signed)
Physical Therapy Evaluation Patient Details Name: Regina Christensen MRN: [id number] DOB: 09-10-1955 Today's Date: 02/08/2019   History of Present Illness  Regina Christensen is a 70yoF who comes to [facility] on 10/19 c heart racing, found to have new AF c RVR. PMH: HTN, depression, morbid obesity.  Clinical Impression  Pt admitted with above diagnosis. Pt currently with functional limitations due to the deficits listed below (see "PT Problem List"). Upon entry, pt in bed, awake and agreeable to participate. Pt tired, doesn't sleep well, has OSA. The pt is alert and oriented x4, pleasant, conversational, and generally a good historian. AMB around unit 2x prior to need to stop. Pt has DOE after 131f which persists and eventually becomes worse. Pt reports thi phenomenon to be improved since last few days PTA. Functional mobility assessment demonstrates increased effort/time requirements, poor tolerance, and need for physical assistance, whereas the patient performed these at a higher level of independence PTA. Pt appears to be in AF in session, but with improved rate control (80s BPM). Pt will benefit from skilled PT intervention to increase independence and safety with basic mobility in preparation for discharge to the venue listed below.       Follow Up Recommendations Other (comment);No PT follow up(Cardiac Rehab?)    Equipment Recommendations  None recommended by PT    Recommendations for Other Services       Precautions / Restrictions Precautions Precautions: None Restrictions Weight Bearing Restrictions: No      Mobility  Bed Mobility Overal bed mobility: Modified Independent                Transfers Overall transfer level: Modified independent                  Ambulation/Gait Ambulation/Gait assistance: Supervision Gait Distance (Feet): 350 Feet(SOB after 1212f that doesn't resolve with standing restx30 sec;) Assistive device: None Gait Pattern/deviations:  WFL(Within Functional Limits) Gait velocity: 0.9437mGait velocity interpretation: <1.31 ft/sec, indicative of household ambulator    Stairs            Wheelchair Mobility    Modified Rankin (Stroke Patients Only)       Balance Overall balance assessment: Independent;No apparent balance deficits (not formally assessed)                                           Pertinent Vitals/Pain Pain Assessment: No/denies pain    Home Living Family/patient expects to be discharged to:: Private residence Living Arrangements: Children(Jennifer Long; DTR, SIL, and 2 gDTRs) Available Help at Discharge: Family Type of Home: Apartment Home Access: Stairs to enter   [employer] Steps: 24 (2nd floor apartment) Home Layout: One level Home Equipment: None      Prior Function Level of Independence: Independent with assistive device(s)         Comments: independent in ADL/IADL, driving; no falls history.     Hand Dominance   Dominant Hand: Right    Extremity/Trunk Assessment   Upper Extremity Assessment Upper Extremity Assessment: Generalized weakness    Lower Extremity Assessment Lower Extremity Assessment: Generalized weakness       Communication   Communication: No difficulties  Cognition Arousal/Alertness: Awake/alert Behavior During Therapy: WFL for tasks assessed/performed Overall Cognitive Status: Within Functional Limits for tasks assessed  General Comments      Exercises     Assessment/Plan    PT Assessment All further PT needs can be met in the next venue of care  PT Problem List Decreased activity tolerance;Decreased mobility;Cardiopulmonary status limiting activity       PT Treatment Interventions Gait training;Therapeutic activities;Therapeutic exercise;Patient/family education    PT Goals (Current goals can be found in the Care Plan section)  Acute Rehab PT  Goals Patient Stated Goal: improve DOE PT Goal Formulation: With patient Time For Goal Achievement: 02/22/19 Potential to Achieve Goals: Good    Frequency Min 2X/week   Barriers to discharge        Co-evaluation               AM-PAC PT "6 Clicks" Mobility  Outcome Measure Help needed turning from your back to your side while in a flat bed without using bedrails?: None Help needed moving from lying on your back to sitting on the side of a flat bed without using bedrails?: None Help needed moving to and from a bed to a chair (including a wheelchair)?: None Help needed standing up from a chair using your arms (e.g., wheelchair or bedside chair)?: None Help needed to walk in hospital room?: A Little Help needed climbing 3-5 steps with a railing? : A Little 6 Click Score: 22    End of Session   Activity Tolerance: Patient tolerated treatment well;No increased pain;Patient limited by fatigue Patient left: in chair;with call bell/phone within reach   PT Visit Diagnosis: Difficulty in walking, not elsewhere classified (R26.2);Other abnormalities of gait and mobility (R26.89)    Time: 4830-7354 PT Time Calculation (min) (ACUTE ONLY): 21 min   Charges:   PT Evaluation $PT Eval Low Complexity: 1 Low PT Treatments $Therapeutic Exercise: 8-22 mins        2:28 PM, 02/08/19 Etta Grandchild, PT, DPT Physical Therapist - [facility]  [phone] ([place])   Buccola,Allan C 02/08/2019, 2:26 PM

## 2019-02-08 NOTE — Telephone Encounter (Signed)
Patient currently admitted at this time. 

## 2019-02-08 NOTE — Progress Notes (Signed)
*  PRELIMINARY RESULTS* Echocardiogram 2D Echocardiogram has been performed.  Regina Christensen 02/08/2019, 10:05 AM

## 2019-02-08 NOTE — Progress Notes (Addendum)
Patient currently still in atrial fibrillation with heart rate of 94. Cardizem gtt going at 10 ml/hr. Patient's VSS, tolerating cardizem gtt. Will continue to monitor.   Update 1056: Per another RN patient called out stating she did not feel good. Vital signs were taken and were WDL, heart rate currently 108. Patient denies nausea just felt "wiped out". Had back pain 7/10 when she felt that way but now is a 3/10. Dr. Margaretmary Eddy paged to notify. Waiting for call back.  Update 1102: Dr. Margaretmary Eddy called back and stated she would see the patient.   Update 1258: New orders were put in by verbal order while Dr.Gouru was at patient's bedside. Urine sample was sent down. Results in, MD notified.

## 2019-02-08 NOTE — Telephone Encounter (Signed)
-----   Message from Nelva Bush, MD sent at 02/08/2019  2:17 PM EDT ----- Regarding: Hospital f/u [place],  Ms. Ruotolo was recently seen by Dr. Garen Lah and was admitted yesterday with a-fib with RVR.  Can you schedule her to be seen by Dr. Garen Lah in ~1 week?  Thanks.  Gerald Stabs

## 2019-02-08 NOTE — Plan of Care (Signed)
  Problem: Education: Goal: Knowledge of General Education information will improve Description: Including pain rating scale, medication(s)/side effects and non-pharmacologic comfort measures Outcome: Progressing   Problem: Clinical Measurements: Goal: Cardiovascular complication will be avoided Outcome: Progressing   Problem: Activity: Goal: Risk for activity intolerance will decrease Outcome: Progressing   Problem: Cardiac: Goal: Ability to achieve and maintain adequate cardiopulmonary perfusion will improve Outcome: Progressing Note: Patient converted to NSR this afternoon with stable heart rate.

## 2019-02-08 NOTE — Progress Notes (Addendum)
Paitent converted to NSR this afternoon, orders to stop the cardizem gtt so that was stopped. Heart rate stable in the 80s-90s. PRN muscle relaxant was given. Patient ambulated with PT around the nurses station as well with stable heart rate.   Update 1632: Patient currently in NSR with heart rate of 73. No complaints at this time. Will continue to monitor.

## 2019-02-08 NOTE — Progress Notes (Signed)
Progress Note  Patient Name: Regina Christensen Date of Encounter: 02/08/2019  Primary Cardiologist: Debbe Odea, MD   Subjective   Dyspnea when walking to bathroom.  She also has chronic orthopnea.  No chest pain or palpitations.  Inpatient Medications    Scheduled Meds: . apixaban  5 mg Oral BID  . Armodafinil  150 mg Oral Daily  . azelastine  1 spray Each Nare BID  . famotidine  20 mg Oral Daily  . loratadine  10 mg Oral Daily  . metoprolol tartrate  50 mg Oral BID  . sodium chloride flush  3 mL Intravenous Q12H  . triamcinolone  2 spray Nasal Daily  . venlafaxine XR  75 mg Oral Q breakfast   Continuous Infusions: . sodium chloride    . diltiazem (CARDIZEM) infusion 10 mg/hr (02/08/19 1008)   PRN Meds: sodium chloride, acetaminophen, albuterol, methocarbamol, ondansetron (ZOFRAN) IV, oxyCODONE-acetaminophen, sodium chloride flush   Vital Signs    Vitals:   02/08/19 0845 02/08/19 1002 02/08/19 1040 02/08/19 1157  BP:   117/86 (!) 127/97  Pulse: 95 85 61 99  Resp:    16  Temp:    98 F (36.7 C)  TempSrc:    Oral  SpO2:   96% 95%  Weight:      Height:        Intake/Output Summary (Last 24 hours) at 02/08/2019 1407 Last data filed at 02/08/2019 1300 Gross per 24 hour  Intake 952.13 ml  Output 1700 ml  Net -747.87 ml   Last 3 Weights 02/08/2019 02/07/2019 02/07/2019  Weight (lbs) 296 lb 3.2 oz 298 lb 280 lb  Weight (kg) 134.355 kg 135.172 kg 127.007 kg      Telemetry    A-fib with HR 70-120 bpm, converted to NSR at 13:15 today.  PVC's versus aberrancy and brief NSVT noted yesterday. - Personally Reviewed  ECG    No new tracing.  Physical Exam   GEN: No acute distress.   Neck: No obvious JVD, though body habitus limits evaluation. Cardiac: RRR, no murmurs, rubs, or gallops.  Respiratory: Clear to auscultation bilaterally. GI: Soft, nontender, non-distended  MS: Trace pretibial edema. Neuro:  Nonfocal  Psych: Normal affect   Labs     High Sensitivity Troponin:   Recent Labs  Lab 02/07/19 0919 02/07/19 1257 02/07/19 1450  TROPONINIHS 5 5 5       Chemistry Recent Labs  Lab 02/03/19 1050 02/07/19 0919 02/08/19 0513  NA 140 139 139  K 4.0 3.7 3.5  CL 104 109 109  CO2 26 22 25   GLUCOSE 120* 110* 119*  BUN 13 13 14   CREATININE 0.75 0.74 0.76  CALCIUM 8.7* 8.4* 8.5*  PROT 6.7 6.7  --   ALBUMIN 3.6 3.4*  --   AST 17 37  --   ALT 33 92*  --   ALKPHOS 82 87  --   BILITOT 0.6 0.6  --   GFRNONAA >60 >60 >60  GFRAA >60 >60 >60  ANIONGAP 10 8 5      Hematology Recent Labs  Lab 02/03/19 1050 02/07/19 0919  WBC 15.6* 16.9*  RBC 4.33 4.41  HGB 12.8 13.0  HCT 39.3 40.8  MCV 90.8 92.5  MCH 29.6 29.5  MCHC 32.6 31.9  RDW 15.7* 15.9*  PLT 387 374    BNPNo results for input(s): BNP, PROBNP in the last 168 hours.   DDimer No results for input(s): DDIMER in the last 168 hours.   Radiology  Dg Chest Portable 1 View  Result Date: 02/07/2019 CLINICAL DATA:  Chest pressure and shortness of breath. EXAM: PORTABLE CHEST 1 VIEW COMPARISON:  08/18/2015 FINDINGS: Top-normal heart size. Stable mild tortuosity of the thoracic aorta. There is no evidence of pulmonary edema, consolidation, pneumothorax, nodule or pleural fluid. IMPRESSION: Top-normal heart size.  No active disease. Electronically Signed   By: Aletta Edouard M.D.   On: 02/07/2019 10:03    Cardiac Studies   TTE (pending)  Patient Profile     63 y.o. female with recently diagnosed atrial fibrillation, admitted with chest pain and shortness of breath in the setting of a-fib with RVR.  Assessment & Plan    Paroxysmal atrial fibrillation: Patient converted to NSR this afternoon.  She remains on metoprolol, apixaban, and diltizem gtt.  Stop diltiazem infusion.  Increase metoprolol tartrate to 100 mg BID.  Continue apixaban 5 mg daily.  F/u echo read.  Continue CPAP use with h/o OSA.  Hypertension:  Increase metoprolol as above and  discontinue diltiazem gtt.  Defer restarting amlodipine-olmesartan for now, given recent addition of metoprolol.  If patient is able to ambulate well, she could be discharged home this afternoon and f/u with Dr. Garen Lah in ~1 week.    For questions or updates, please contact [place] Please consult [email or web address] for contact info under [facility] cardiology.   Signed, Nelva Bush, MD  02/08/2019, 2:07 PM

## 2019-02-09 DIAGNOSIS — I480 Paroxysmal atrial fibrillation: Secondary | ICD-10-CM | POA: Diagnosis not present

## 2019-02-09 LAB — URINE CULTURE: Special Requests: NORMAL

## 2019-02-09 MED ORDER — METHOCARBAMOL 500 MG PO TABS: 500.0000 mg | ORAL_TABLET | Freq: Three times a day (TID) | ORAL | 0 refills | Status: DC | PRN

## 2019-02-09 MED ORDER — METOPROLOL TARTRATE 100 MG PO TABS: 100.0000 mg | ORAL_TABLET | Freq: Two times a day (BID) | ORAL | 0 refills | Status: DC

## 2019-02-09 MED ORDER — OXYCODONE HCL 5 MG PO TABS: 5.0000 mg | ORAL_TABLET | Freq: Four times a day (QID) | ORAL | Status: DC | PRN

## 2019-02-09 MED ORDER — OXYCODONE HCL 5 MG PO TABS: 5.0000 mg | ORAL_TABLET | Freq: Four times a day (QID) | ORAL | 0 refills | Status: DC | PRN

## 2019-02-09 NOTE — Progress Notes (Signed)
Progress Note  Patient Name: Regina Christensen Date of Encounter: 02/09/2019  Primary Cardiologist: Kate Sable, MD   Subjective   States doing okay this morning.  Denies chest pain, shortness of breath, palpitations.  Currently in sinus rhythm.  Inpatient Medications    Scheduled Meds: . apixaban  5 mg Oral BID  . Armodafinil  150 mg Oral Daily  . azelastine  1 spray Each Nare BID  . famotidine  20 mg Oral Daily  . loratadine  10 mg Oral Daily  . metoprolol tartrate  100 mg Oral BID  . sodium chloride flush  3 mL Intravenous Q12H  . triamcinolone  2 spray Nasal Daily  . venlafaxine XR  75 mg Oral Q breakfast   Continuous Infusions: . sodium chloride     PRN Meds: sodium chloride, acetaminophen, albuterol, methocarbamol, ondansetron (ZOFRAN) IV, oxyCODONE, oxyCODONE-acetaminophen, sodium chloride flush   Vital Signs    Vitals:   02/08/19 1625 02/08/19 1954 02/09/19 0420 02/09/19 0757  BP: 126/81 (!) 160/94 117/75 130/77  Pulse: 72 82 79 79  Resp: 16  20 19   Temp: 97.6 F (36.4 C) (!) 97.5 F (36.4 C) 97.6 F (36.4 C) 98.1 F (36.7 C)  TempSrc: Oral Oral Oral Oral  SpO2: 96% 99% 95% 95%  Weight:   135 kg   Height:        Intake/Output Summary (Last 24 hours) at 02/09/2019 1247 Last data filed at 02/09/2019 0955 Gross per 24 hour  Intake 651.14 ml  Output 900 ml  Net -248.86 ml   Last 3 Weights 02/09/2019 02/08/2019 02/07/2019  Weight (lbs) 297 lb 11.2 oz 296 lb 3.2 oz 298 lb  Weight (kg) 135.036 kg 134.355 kg 135.172 kg      Telemetry    Normal sinus rhythm, heart rate 80s.- Personally Reviewed  ECG    Not obtained today  Physical Exam   GEN: No acute distress.   Neck: No JVD Cardiac: RRR, no murmurs, rubs, or gallops.  Respiratory: Clear to auscultation bilaterally. GI: Soft, nontender, non-distended  MS: No edema; No deformity. Neuro:  Nonfocal  Psych: Normal affect   Labs    High Sensitivity Troponin:   Recent Labs  Lab  02/07/19 0919 02/07/19 1257 02/07/19 1450  TROPONINIHS 5 5 5       Chemistry Recent Labs  Lab 02/03/19 1050 02/07/19 0919 02/08/19 0513  NA 140 139 139  K 4.0 3.7 3.5  CL 104 109 109  CO2 26 22 25   GLUCOSE 120* 110* 119*  BUN 13 13 14   CREATININE 0.75 0.74 0.76  CALCIUM 8.7* 8.4* 8.5*  PROT 6.7 6.7  --   ALBUMIN 3.6 3.4*  --   AST 17 37  --   ALT 33 92*  --   ALKPHOS 82 87  --   BILITOT 0.6 0.6  --   GFRNONAA >60 >60 >60  GFRAA >60 >60 >60  ANIONGAP 10 8 5      Hematology Recent Labs  Lab 02/03/19 1050 02/07/19 0919  WBC 15.6* 16.9*  RBC 4.33 4.41  HGB 12.8 13.0  HCT 39.3 40.8  MCV 90.8 92.5  MCH 29.6 29.5  MCHC 32.6 31.9  RDW 15.7* 15.9*  PLT 387 374    BNPNo results for input(s): BNP, PROBNP in the last 168 hours.   DDimer No results for input(s): DDIMER in the last 168 hours.   Radiology    No results found.  Cardiac Studies   Echocardiogram dated 02/09/2019 IMPRESSIONS  1. Left ventricular ejection fraction, by visual estimation, is 55 to 60%. The left ventricle has normal function. Normal left ventricular size. There is mildly increased left ventricular hypertrophy.  2. Definity contrast agent was given IV to delineate the left ventricular endocardial borders.  3. Left ventricular diastolic Doppler parameters are indeterminate pattern of LV diastolic filling.  4. Global right ventricle has low normal systolic function.The right ventricular size is not well visualized. No increase in right ventricular wall thickness.  5. Left atrial size was moderately dilated.  6. Right atrial size was moderately dilated.  7. Trivial pericardial effusion is present.  8. The mitral valve is normal in structure. Mild mitral valve regurgitation.  9. The tricuspid valve is normal in structure. Tricuspid valve regurgitation moderate. 10. The aortic valve is tricuspid Aortic valve regurgitation was not visualized by color flow Doppler. There is no aortic valve  stenosis. 11. The pulmonic valve was not well visualized. Pulmonic valve regurgitation is not visualized by color flow Doppler. 12. Normal pulmonary artery systolic pressure. 13. The inferior vena cava is dilated in size with <50% respiratory variability, suggesting right atrial pressure of 15 mmHg. 14. The interatrial septum was not well visualized.  Patient Profile     63 y.o. female with history of paroxysmal atrial fibrillation, hypertension who presents due to atrial fibrillation with rapid ventricular response.  Assessment & Plan    Patient spontaneously converted to normal sinus rhythm.  She is currently in sinus rhythm with no symptoms.    Lopressor increased to 100 mg twice daily.  Continue Eliquis 5 mg twice daily.  Hold other blood pressure meds for now and on discharge.  She is to follow-up's in the office in about a week or so and we can titrate blood pressure meds as needed.        Signed, Debbe Odea, MD  02/09/2019, 12:47 PM

## 2019-02-09 NOTE — Progress Notes (Signed)
Patient ambulated around the nurses station with nurse tech with a stable heart rate in the 90s. MD notified, patient will be discharging today.

## 2019-02-09 NOTE — Plan of Care (Signed)

## 2019-02-09 NOTE — Discharge Instructions (Signed)
Follow-up with primary care physician in 3 days Follow-up with cardiology in 1 week Resume work from 02/14/2019

## 2019-02-09 NOTE — Progress Notes (Signed)
Patient given discharge instructions with prescription and work note. Patient verbalized understanding without any questions or concerns. IV's taken out and tele monitor off. Patient discharging home in stable condition.

## 2019-02-09 NOTE — Discharge Summary (Signed)
[facility] NAME: Regina Christensen    MR#:  [id number]  DATE OF BIRTH:  [birth year]  DATE OF ADMISSION:  02/07/2019 ADMITTING PHYSICIAN: Saundra Shelling, MD  DATE OF DISCHARGE: 02/09/2019  PRIMARY CARE PHYSICIAN: Steele Sizer, MD    ADMISSION DIAGNOSIS:  Atrial fibrillation with rapid ventricular response ([place]) [I48.91]  DISCHARGE DIAGNOSIS:  Atrial fibrillation with RVR Musculoskeletal right chest wall pain  SECONDARY DIAGNOSIS:   Past Medical History:  Diagnosis Date  . Arthritis   . Depression   . Hypertension   . Morbid obesity with BMI of 45.0-49.9, adult ([place])   . Obesity   . Sleep apnea     HOSPITAL COURSE:   HISTORY OF PRESENT ILLNESS: Regina Christensen  is a 63 y.o. female with a known history of hypertension, sleep apnea, atrial fibrillation on Eliquis for anticoagulation, obesity presented to the emergency room for palpitations and heart racing.  Patient also felt some chest pressure.  She presented to the emergency room and had rate was around 130 to 140 bpm she was given IV Cardizem push.  Heart rate could not be controlled.  Patient was started on IV Cardizem drip and cardiology was consulted and hospitalist service was notified.  No fever chills cough myalgias.  COVID-19 test pending  Atrial fibrillation with rapid rate Patient improved with Cardizem drip and converted to sinus rhythm Discontinue Cardizem drip and titrated metoprolol dose Cardiology consult-follow-up with [facility] in 1 week after discharge  Eliquis for anticoagulation Nontrending troponins Okay to discharge patient from cardiology standpoint.  Patient ambulated in the hallway without palpitations and heart rate went up to 90-95, will discharge patient  -Leukocytosis with right flank pain and urinalysis with bacteria We will check urine culture and sensitivity and hold off on antibiotics at this time.  PCP to follow-up on the urine  culture and sensitivity and if necessary consider antibiotics  -Right anterior chest wall pain-musculoskeletal pain Symptomatic treatment with K pad and muscle relaxants Tylenol as needed   -Hypertension Holding home medication azor and dced bystolic and patient is on metoprolol 100 mg .  Continue home medication clonidine  -DVT prophylaxis On anticoagulation with Eliquis  Discharge patient home.  Can resume work from Monday 10/26 without any restrictions.  Work excuse letter given  DISCHARGE CONDITIONS:   fair  CONSULTS OBTAINED:  Treatment Team:  Wellington Hampshire, MD   PROCEDURES  None   DRUG ALLERGIES:   Allergies  Allergen Reactions  . Ace Inhibitors Cough  . Hctz [Hydrochlorothiazide] Rash    face    DISCHARGE MEDICATIONS:   Allergies as of 02/09/2019      Reactions   Ace Inhibitors Cough   Hctz [hydrochlorothiazide] Rash   face      Medication List    STOP taking these medications   amLODipine-olmesartan 5-40 MG tablet Commonly known as: AZOR   nebivolol 10 MG tablet Commonly known as: BYSTOLIC     TAKE these medications   acetaminophen 650 MG CR tablet Commonly known as: Tylenol 8 Hour Arthritis Pain Take 1 tablet (650 mg total) by mouth every 8 (eight) hours as needed for pain. What changed: how much to take   albuterol 108 (90 Base) MCG/ACT inhaler Commonly known as: VENTOLIN HFA TAKE 2 PUFFS BY MOUTH EVERY 6 HOURS AS NEEDED FOR WHEEZE OR SHORTNESS OF BREATH   apixaban 5 MG Tabs tablet Commonly known as: Eliquis Take 1 tablet (5 mg total) by mouth  2 (two) times daily.   Armodafinil 150 MG tablet Take 1 tablet (150 mg total) by mouth daily.   azelastine 0.1 % nasal spray Commonly known as: ASTELIN PLACE 2 SPRAYS INTO BOTH NOSTRILS 2 (TWO) TIMES DAILY   cloNIDine 0.1 mg/24hr patch Commonly known as: CATAPRES - Dosed in mg/24 hr PLACE 1 PATCH (0.1 MG TOTAL) ONTO THE SKIN ONCE A WEEK.   famotidine 20 MG tablet Commonly known  as: Pepcid Take 1 tablet (20 mg total) by mouth daily.   fexofenadine 180 MG tablet Commonly known as: ALLEGRA Take 180 mg by mouth daily.   methocarbamol 500 MG tablet Commonly known as: ROBAXIN Take 1 tablet (500 mg total) by mouth every 8 (eight) hours as needed for muscle spasms.   metoprolol tartrate 100 MG tablet Commonly known as: LOPRESSOR Take 1 tablet (100 mg total) by mouth 2 (two) times daily. What changed:   medication strength  how much to take   oxyCODONE 5 MG immediate release tablet Commonly known as: Oxy IR/ROXICODONE Take 1 tablet (5 mg total) by mouth every 6 (six) hours as needed for moderate pain or breakthrough pain.   Ozempic (1 MG/DOSE) 2 MG/1.5ML Sopn Generic drug: Semaglutide (1 MG/DOSE) Inject 1 mg into the skin once a week.   triamcinolone 0.1 % cream : eucerin Crea Apply 1 application topically 2 (two) times daily.   triamcinolone 55 MCG/ACT Aero nasal inhaler Commonly known as: NASACORT Place 2 sprays into the nose daily.   triamcinolone cream 0.1 % Commonly known as: KENALOG APPLY TO AFFECTRED AREA TWICE A DAYT   venlafaxine XR 75 MG 24 hr capsule Commonly known as: Effexor XR Take 1 capsule (75 mg total) by mouth daily with breakfast. To be combined with 150 mg   venlafaxine XR 150 MG 24 hr capsule Commonly known as: EFFEXOR-XR To be combined with 75 mg   Xiidra 5 % Soln Generic drug: Lifitegrast Place 1 drop into both eyes 2 (two) times daily.        DISCHARGE INSTRUCTIONS:   Follow-up with primary care physician in 3 days Follow-up with cardiology in 1 week Resume work from 02/14/2019  DIET:  Cardiac diet  DISCHARGE CONDITION:  Fair  ACTIVITY:  Activity as tolerated  OXYGEN:  Home Oxygen: No.   Oxygen Delivery: room air  DISCHARGE LOCATION:  home   If you experience worsening of your admission symptoms, develop shortness of breath, life threatening emergency, suicidal or homicidal thoughts you must seek  medical attention immediately by calling 911 or calling your MD immediately  if symptoms less severe.  You Must read complete instructions/literature along with all the possible adverse reactions/side effects for all the Medicines you take and that have been prescribed to you. Take any new Medicines after you have completely understood and accpet all the possible adverse reactions/side effects.   Please note  You were cared for by a hospitalist during your hospital stay. If you have any questions about your discharge medications or the care you received while you were in the hospital after you are discharged, you can call the unit and asked to speak with the hospitalist on call if the hospitalist that took care of you is not available. Once you are discharged, your primary care physician will handle any further medical issues. Please note that NO REFILLS for any discharge medications will be authorized once you are discharged, as it is imperative that you return to your primary care physician (or establish a relationship with  a primary care physician if you do not have one) for your aftercare needs so that they can reassess your need for medications and monitor your lab values.     Today  Chief Complaint  Patient presents with  . Chest Pain  . Shortness of Breath   Patient is feeling much better denies any palpitations or chest pain.  Right anterior chest wall pain improved, no flank pain today during my examination ROS:  CONSTITUTIONAL: Denies fevers, chills. Denies any fatigue, weakness.  EYES: Denies blurry vision, double vision, eye pain. EARS, NOSE, THROAT: Denies tinnitus, ear pain, hearing loss. RESPIRATORY: Denies cough, wheeze, shortness of breath.  CARDIOVASCULAR: Denies chest pain, palpitations, edema.  GASTROINTESTINAL: Denies nausea, vomiting, diarrhea, abdominal pain. Denies bright red blood per rectum. GENITOURINARY: Denies dysuria, hematuria. ENDOCRINE: Denies nocturia or  thyroid problems. HEMATOLOGIC AND LYMPHATIC: Denies easy bruising or bleeding. SKIN: Denies rash or lesion. MUSCULOSKELETAL: Denies pain in neck, back, shoulder, knees, hips or arthritic symptoms.  NEUROLOGIC: Denies paralysis, paresthesias.  PSYCHIATRIC: Denies anxiety or depressive symptoms.   VITAL SIGNS:  Blood pressure 130/77, pulse 79, temperature 98.1 F (36.7 C), temperature source Oral, resp. rate 19, height 5\' 8"  (1.727 m), weight 135 kg, SpO2 95 %.  I/O:    Intake/Output Summary (Last 24 hours) at 02/09/2019 1048 Last data filed at 02/09/2019 0955 Gross per 24 hour  Intake 651.14 ml  Output 1800 ml  Net -1148.86 ml    PHYSICAL EXAMINATION:  GENERAL:  63 y.o.-year-old patient lying in the bed with no acute distress.  EYES: Pupils equal, round, reactive to light and accommodation. No scleral icterus. Extraocular muscles intact.  HEENT: Head atraumatic, normocephalic. Oropharynx and nasopharynx clear.  NECK:  Supple, no jugular venous distention. No thyroid enlargement, no tenderness.  LUNGS: Normal breath sounds bilaterally, no wheezing, rales,rhonchi or crepitation. No use of accessory muscles of respiration.  CARDIOVASCULAR: S1, S2 normal. No murmurs, rubs, or gallops.  ABDOMEN: Soft, non-tender, non-distended. Bowel sounds present.  EXTREMITIES: No pedal edema, cyanosis, or clubbing.  NEUROLOGIC: Cranial nerves II through XII are intact. Muscle strength 5/5 in all extremities. Sensation intact. Gait not checked.  PSYCHIATRIC: The patient is alert and oriented x 3.  SKIN: No obvious rash, lesion, or ulcer.   DATA REVIEW:   CBC Recent Labs  Lab 02/07/19 0919  WBC 16.9*  HGB 13.0  HCT 40.8  PLT 374    Chemistries  Recent Labs  Lab 02/07/19 0919 02/08/19 0513  NA 139 139  K 3.7 3.5  CL 109 109  CO2 22 25  GLUCOSE 110* 119*  BUN 13 14  CREATININE 0.74 0.76  CALCIUM 8.4* 8.5*  AST 37  --   ALT 92*  --   ALKPHOS 87  --   BILITOT 0.6  --      Cardiac Enzymes No results for input(s): TROPONINI in the last 168 hours.  Microbiology Results  Results for orders placed or performed during the hospital encounter of 02/07/19  SARS CORONAVIRUS 2 (TAT 6-24 HRS) Nasopharyngeal Nasopharyngeal Swab     Status: None   Collection Time: 02/07/19  1:24 PM   Specimen: Nasopharyngeal Swab  Result Value Ref Range Status   SARS Coronavirus 2 NEGATIVE NEGATIVE Final    Comment: (NOTE) SARS-CoV-2 target nucleic acids are NOT DETECTED. The SARS-CoV-2 RNA is generally detectable in upper and lower respiratory specimens during the acute phase of infection. Negative results do not preclude SARS-CoV-2 infection, do not rule out co-infections with other pathogens,  and should not be used as the sole basis for treatment or other patient management decisions. Negative results must be combined with clinical observations, patient history, and epidemiological information. The expected result is Negative. Fact Sheet for Patients: HairSlick.no Fact Sheet for Healthcare Providers: quierodirigir.com This test is not yet approved or cleared by the [place] FDA and  has been authorized for detection and/or diagnosis of SARS-CoV-2 by FDA under an Emergency Use Authorization (EUA). This EUA will remain  in effect (meaning this test can be used) for the duration of the COVID-19 declaration under Section 56 4(b)(1) of the Act, 21 U.S.C. section 360bbb-3(b)(1), unless the authorization is terminated or revoked sooner. Performed at [facility], [address], [place], [place] [address]     RADIOLOGY:  Dg Chest Portable 1 View  Result Date: 02/07/2019 CLINICAL DATA:  Chest pressure and shortness of breath. EXAM: PORTABLE CHEST 1 VIEW COMPARISON:  08/18/2015 FINDINGS: Top-normal heart size. Stable mild tortuosity of the thoracic aorta. There is no evidence of pulmonary edema, consolidation,  pneumothorax, nodule or pleural fluid. IMPRESSION: Top-normal heart size.  No active disease. Electronically Signed   By: Irish Lack M.D.   On: 02/07/2019 10:03    EKG:   Orders placed or performed during the hospital encounter of 02/07/19  . ED EKG  . ED EKG      Management plans discussed with the patient, family and they are in agreement.  CODE STATUS:     Code Status Orders  (From admission, onward)         Start     Ordered   02/07/19 1412  Full code  Continuous     02/07/19 1411        Code Status History    Date Active Date Inactive Code Status Order ID Comments User Context   08/18/2015 2000 08/19/2015 1708 Full Code [id number]  Katha Hamming, MD ED   Advance Care Planning Activity      TOTAL TIME TAKING CARE OF THIS PATIENT: 45  minutes.   Note: This dictation was prepared with Dragon dictation along with smaller phrase technology. Any transcriptional errors that result from this process are unintentional.   @  on 02/09/2019 at 10:48 AM  Between 7am to 6pm - Pager - [phone]  After 6pm go to [email or web address] - password [email or web address] [facility]  [facility]  Office  [phone]  CC: Primary care physician; [name], MD

## 2019-02-11 NOTE — Telephone Encounter (Signed)
I spoke with the patient.  Patient contacted regarding discharge from [facility] on 02/09/2019.  Patient understands to follow up with provider Dr. Garen Lah on Tuesday 02/15/2019 at 9:00 am at the [facility] office. Patient understands discharge instructions? yes Patient understands medications and regiment? yes Patient understands to bring all medications to this visit? yes

## 2019-02-11 NOTE — Telephone Encounter (Signed)
1st attempt TCM-   Attempted to call the patient. No answer- I left a message to please call back. 

## 2019-02-14 ENCOUNTER — Encounter: Payer: Self-pay | Admitting: Family Medicine

## 2019-02-14 ENCOUNTER — Ambulatory Visit: Payer: BC Managed Care – PPO | Admitting: Family Medicine

## 2019-02-14 ENCOUNTER — Other Ambulatory Visit: Payer: Self-pay

## 2019-02-14 VITALS — BP 168/94 | HR 82 | Temp 96.8°F | Resp 16 | Ht 68.0 in | Wt 284.3 lb

## 2019-02-14 DIAGNOSIS — I10 Essential (primary) hypertension: Secondary | ICD-10-CM

## 2019-02-14 DIAGNOSIS — I4891 Unspecified atrial fibrillation: Secondary | ICD-10-CM

## 2019-02-14 NOTE — Progress Notes (Signed)
Name: Regina Christensen   MRN: [id number]    DOB: 1955-05-25   Date:02/14/2019       Progress Note  Subjective  Chief Complaint  Chief Complaint  Patient presents with  . Hospitalization Follow-up  . Atrial Fibrillation  . Tachycardia  . Shortness of Breath    HPI  Hospital Discharge Follow up: Admitted on 02/07/2019 and released on 02/09/2019. She was diagnosed with new onset Afib on 02/03/2019 and seen by Cardiologist the same day and was given Metoprolol 25 mg BID and Eliquis 5 mg . She states dizziness, fatigue and palpitation got progressively worse and on 02/07/2019 she decided to go to [facility], her heart rate was elevated and she was placed on IV cardizem drip , she was switched to Metoprol 100 mg BID , since discharge she has been feeling better. She still feels slightly tired but nothing she was before. She had a very good day this past Saturday . BP today was very high upon arrival but went down to 154/92 , she still has clonidine patch, and was advised to stop Bystolic and Azor 5/40. She is going to see cardiologist tomorrow, therefore I will not adjust medication.     1. Left ventricular ejection fraction, by visual estimation, is 55 to 60%. The left ventricle has normal function. Normal left ventricular size. There is mildly increased left ventricular hypertrophy. 2. Definity contrast agent was given IV to delineate the left ventricular endocardial borders. 3. Left ventricular diastolic Doppler parameters are indeterminate pattern of LV diastolic filling. 4. Global right ventricle has low normal systolic function.The right ventricular size is not well visualized. No increase in right ventricular wall thickness. 5. Left atrial size was moderately dilated. 6. Right atrial size was moderately dilated. 7. Trivial pericardial effusion is present. 8. The mitral valve is normal in structure. Mild mitral valve regurgitation. 9. The tricuspid valve is normal in structure. Tricuspid valve  regurgitation moderate. 10. The aortic valve is tricuspid Aortic valve regurgitation was not visualized by color flow Doppler. There is no aortic valve stenosis. 11. The pulmonic valve was not well visualized. Pulmonic valve regurgitation is not visualized by color flow Doppler. 12. Normal pulmonary artery systolic pressure. 13. The inferior vena cava is dilated in size with <50% respiratory variability, suggesting right atrial pressure of 15 mmHg. 14. The interatrial septum was not well visualized.   Patient Active Problem List   Diagnosis Date Noted  . A-fib (HCC) 02/08/2019  . Atrial fibrillation with rapid ventricular response (HCC) 02/07/2019  . Atrial fibrillation, new onset (HCC) 02/03/2019  . Bilateral carpal tunnel syndrome 07/30/2016  . OSA (obstructive sleep apnea) 03/07/2016  . MDD (major depressive disorder), recurrent episode, mild (HCC) 11/30/2015  . Osteoarthritis of both knees 11/30/2015  . BPPV (benign paroxysmal positional vertigo) 08/28/2015  . Essential hypertension 11/09/2014  . Hyperglycemia 11/09/2014  . GAD (generalized anxiety disorder) 11/09/2014  . Acanthosis nigricans 11/09/2014  . Morbid obesity (HCC) 01/06/2012    Past Surgical History:  Procedure Laterality Date  . ABDOMINAL HYSTERECTOMY    . BREAST EXCISIONAL BIOPSY Left   . ENDOMETRIAL ABLATION     x2  . GASTRIC BYPASS     sleeve    Family History  Problem Relation Age of Onset  . Heart disease Father   . Cancer Brother   . Mental illness Neg Hx     Social History   Socioeconomic History  . Marital status: Divorced    Spouse name: Not on file  .  Number of children: 2  . Years of education: Not on file  . Highest education level: Associate degree: occupational, Scientist, product/process development, or vocational program  Occupational History  . Occupation: patient Tax adviser: [employer]  Social Needs  . Financial resource strain: Not hard at all  . Food insecurity    Worry: Never true     Inability: Never true  . Transportation needs    Medical: No    Non-medical: No  Tobacco Use  . Smoking status: Never Smoker  . Smokeless tobacco: Never Used  Substance and Sexual Activity  . Alcohol use: No    Alcohol/week: 0.0 standard drinks    Comment: rare  . Drug use: No  . Sexual activity: Never  Lifestyle  . Physical activity    Days per week: 0 days    Minutes per session: 0 min  . Stress: Rather much  Relationships  . Social connections    Talks on phone: More than three times a week    Gets together: More than three times a week    Attends religious service: More than 4 times per year    Active member of club or organization: No    Attends meetings of clubs or organizations: Never    Relationship status: Divorced  . Intimate partner violence    Fear of current or ex partner: No    Emotionally abused: No    Physically abused: No    Forced sexual activity: No  Other Topics Concern  . 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  acetaminophen (TYLENOL 8 HOUR ARTHRITIS PAIN) 650 MG CR tablet, Take 1 tablet (650 mg total) by mouth every 8 (eight) hours as needed for pain. (Patient taking differently: Take 1,300 mg by mouth every 8 (eight) hours as needed for pain. ), Disp: 90 tablet, Rfl: 0 .  albuterol (PROVENTIL HFA;VENTOLIN HFA) 108 (90 Base) MCG/ACT inhaler, TAKE 2 PUFFS BY MOUTH EVERY 6 HOURS AS NEEDED FOR WHEEZE OR SHORTNESS OF BREATH, Disp: 8.5 Inhaler, Rfl: 0 .  apixaban (ELIQUIS) 5 MG TABS tablet, Take 1 tablet (5 mg total) by mouth 2 (two) times daily., Disp: 60 tablet, Rfl: 5 .  azelastine (ASTELIN) 0.1 % nasal spray, PLACE 2 SPRAYS INTO BOTH NOSTRILS 2 (TWO) TIMES DAILY, Disp: 30 mL, Rfl: 1 .  cloNIDine (CATAPRES - DOSED IN MG/24 HR) 0.1 mg/24hr patch, PLACE 1 PATCH (0.1 MG TOTAL)  ONTO THE SKIN ONCE A WEEK., Disp: 12 patch, Rfl: 1 .  famotidine (PEPCID) 20 MG tablet, Take 1 tablet (20 mg total) by mouth daily., Disp: 90 tablet, Rfl: 0 .  fexofenadine (ALLEGRA) 180 MG tablet, Take 180 mg by mouth daily., Disp: , Rfl:  .  methocarbamol (ROBAXIN) 500 MG tablet, Take 1 tablet (500 mg total) by mouth every 8 (eight) hours as needed for muscle spasms., Disp: 20 tablet, Rfl: 0 .  metoprolol tartrate (LOPRESSOR) 100 MG tablet, Take 1 tablet (100 mg total) by mouth 2 (two) times daily., Disp: 60 tablet, Rfl: 0 .  Semaglutide, 1 MG/DOSE, (OZEMPIC, 1 MG/DOSE,) 2 MG/1.5ML SOPN, Inject 1 mg into the skin once a week., Disp: 9 mL, Rfl: 1 .  triamcinolone (NASACORT) 55 MCG/ACT AERO nasal inhaler, Place 2 sprays into  the nose daily., Disp: , Rfl:  .  Triamcinolone Acetonide (TRIAMCINOLONE 0.1 % CREAM : EUCERIN) CREA, Apply 1 application topically 2 (two) times daily., Disp: 1 each, Rfl: 0 .  triamcinolone cream (KENALOG) 0.1 %, APPLY TO AFFECTRED AREA TWICE A DAYT, Disp: 80 g, Rfl: 0 .  venlafaxine XR (EFFEXOR XR) 75 MG 24 hr capsule, Take 1 capsule (75 mg total) by mouth daily with breakfast. To be combined with 150 mg, Disp: 90 capsule, Rfl: 0 .  venlafaxine XR (EFFEXOR-XR) 150 MG 24 hr capsule, To be combined with 75 mg, Disp: 90 capsule, Rfl: 0 .  XIIDRA 5 % SOLN, Place 1 drop into both eyes 2 (two) times daily., Disp: , Rfl: 4 .  Armodafinil 150 MG tablet, Take 1 tablet (150 mg total) by mouth daily. (Patient not taking: Reported on 02/14/2019), Disp: 30 tablet, Rfl: 2 .  oxyCODONE (OXY IR/ROXICODONE) 5 MG immediate release tablet, Take 1 tablet (5 mg total) by mouth every 6 (six) hours as needed for moderate pain or breakthrough pain. (Patient not taking: Reported on 02/14/2019), Disp: 12 tablet, Rfl: 0  Allergies  Allergen Reactions  . Ace Inhibitors Cough  . Hctz [Hydrochlorothiazide] Rash    face    I personally reviewed active problem list, medication list, allergies, family  history, social history, health maintenance with the patient/caregiver today.   ROS  Constitutional: Negative for fever or weight change.  Respiratory: Negative for cough, positive for very mild shortness of breath.   Cardiovascular: Negative for chest pain or palpitations.  Gastrointestinal: Negative for abdominal pain, no bowel changes.  Musculoskeletal: Negative for gait problem or joint swelling.  Skin: Negative for rash.  Neurological: positive for intermittent dizziness but no  headache.  No other specific complaints in a complete review of systems (except as listed in HPI above).  Objective  Vitals:   02/14/19 1151  BP: (!) 168/94  Pulse: 82  Resp: 16  Temp: (!) 96.8 F (36 C)  TempSrc: Temporal  SpO2: 96%  Weight: 284 lb 4.8 oz (129 kg)  Height: 5\' 8"  (1.727 m)    Body mass index is 43.23 kg/m.  Physical Exam  Constitutional: Patient appears well-developed and well-nourished. Obese  No distress.  HEENT: head atraumatic, normocephalic, pupils equal and reactive to light Cardiovascular: Normal rate, irregular rhythm and normal heart sounds.  No murmur heard. No BLE edema. Pulmonary/Chest: Effort normal and breath sounds normal. No respiratory distress. Abdominal: Soft.  There is no tenderness. Psychiatric: Patient has a normal mood and affect. behavior is normal. Judgment and thought content normal.  Recent Results (from the past 2160 hour(s))  TSH     Status: None   Collection Time: 02/03/19 10:50 AM  Result Value Ref Range   TSH 1.288 0.350 - 4.500 uIU/mL    Comment: Performed by a 3rd Generation assay with a functional sensitivity of <=0.01 uIU/mL. Performed at [facility], [address] [place], [place] [address]   Lipid panel     Status: None   Collection Time: 02/03/19 10:50 AM  Result Value Ref Range   Cholesterol 129 0 - 200 mg/dL   Triglycerides 86 <150 mg/dL   HDL 76 >40 mg/dL   Total CHOL/HDL Ratio 1.7 RATIO   VLDL 17 0 - 40 mg/dL    LDL Cholesterol 36 0 - 99 mg/dL    Comment:        Total Cholesterol/HDL:CHD Risk Coronary Heart Disease Risk Table  Men   Women  1/2 Average Risk   3.4   3.3  Average Risk       5.0   4.4  2 X Average Risk   9.6   7.1  3 X Average Risk  23.4   11.0        Use the calculated Patient Ratio above and the CHD Risk Table to determine the patient's CHD Risk.        ATP III CLASSIFICATION (LDL):  <100     mg/dL   Optimal  161-096  mg/dL   Near or Above                    Optimal  130-159  mg/dL   Borderline  045-409  mg/dL   High  >811     mg/dL   Very High Performed at [facility], [address] [place], [place] [address]   Hemoglobin A1c     Status: Abnormal   Collection Time: 02/03/19 10:50 AM  Result Value Ref Range   Hgb A1c MFr Bld 6.3 (H) 4.8 - 5.6 %    Comment: (NOTE) Pre diabetes:          5.7%-6.4% Diabetes:              >6.4% Glycemic control for   <7.0% adults with diabetes    Mean Plasma Glucose 134.11 mg/dL    Comment: Performed at [facility], [address] [place], [place] [address]  Comprehensive metabolic panel     Status: Abnormal   Collection Time: 02/03/19 10:50 AM  Result Value Ref Range   Sodium 140 135 - 145 mmol/L   Potassium 4.0 3.5 - 5.1 mmol/L   Chloride 104 98 - 111 mmol/L   CO2 26 22 - 32 mmol/L   Glucose, Bld 120 (H) 70 - 99 mg/dL   BUN 13 8 - 23 mg/dL   Creatinine, Ser 1.30 0.44 - 1.00 mg/dL   Calcium 8.7 (L) 8.9 - 10.3 mg/dL   Total Protein 6.7 6.5 - 8.1 g/dL   Albumin 3.6 3.5 - 5.0 g/dL   AST 17 15 - 41 U/L   ALT 33 0 - 44 U/L   Alkaline Phosphatase 82 38 - 126 U/L   Total Bilirubin 0.6 0.3 - 1.2 mg/dL   GFR calc non Af Amer >60 >60 mL/min   GFR calc Af Amer >60 >60 mL/min   Anion gap 10 5 - 15    Comment: Performed at [facility], [address] [place], [place] [address]  CBC with Differential/Platelet     Status: Abnormal   Collection Time: 02/03/19 10:50 AM  Result Value  Ref Range   WBC 15.6 (H) 4.0 - 10.5 K/uL   RBC 4.33 3.87 - 5.11 MIL/uL   Hemoglobin 12.8 12.0 - 15.0 g/dL   HCT 46.9 62.9 - 52.8 %   MCV 90.8 80.0 - 100.0 fL   MCH 29.6 26.0 - 34.0 pg   MCHC 32.6 30.0 - 36.0 g/dL   RDW 41.3 (H) 24.4 - 01.0 %   Platelets 387 150 - 400 K/uL   nRBC 0.0 0.0 - 0.2 %   Neutrophils Relative % 73 %   Neutro Abs 11.4 (H) 1.7 - 7.7 K/uL   Lymphocytes Relative 18 %   Lymphs Abs 2.8 0.7 - 4.0 K/uL   Monocytes Relative 7 %   Monocytes Absolute 1.1 (H) 0.1 - 1.0 K/uL   Eosinophils Relative  1 %   Eosinophils Absolute 0.1 0.0 - 0.5 K/uL   Basophils Relative 0 %   Basophils Absolute 0.1 0.0 - 0.1 K/uL   Immature Granulocytes 1 %   Abs Immature Granulocytes 0.17 (H) 0.00 - 0.07 K/uL    Comment: Performed at [facility], [address] [place], [place] [address]  CBC with Differential     Status: Abnormal   Collection Time: 02/07/19  9:19 AM  Result Value Ref Range   WBC 16.9 (H) 4.0 - 10.5 K/uL   RBC 4.41 3.87 - 5.11 MIL/uL   Hemoglobin 13.0 12.0 - 15.0 g/dL   HCT 62.1 30.8 - 65.7 %   MCV 92.5 80.0 - 100.0 fL   MCH 29.5 26.0 - 34.0 pg   MCHC 31.9 30.0 - 36.0 g/dL   RDW 84.6 (H) 96.2 - 95.2 %   Platelets 374 150 - 400 K/uL   nRBC 0.1 0.0 - 0.2 %   Neutrophils Relative % 76 %   Neutro Abs 12.7 (H) 1.7 - 7.7 K/uL   Lymphocytes Relative 18 %   Lymphs Abs 3.1 0.7 - 4.0 K/uL   Monocytes Relative 5 %   Monocytes Absolute 0.9 0.1 - 1.0 K/uL   Eosinophils Relative 0 %   Eosinophils Absolute 0.1 0.0 - 0.5 K/uL   Basophils Relative 0 %   Basophils Absolute 0.1 0.0 - 0.1 K/uL   Immature Granulocytes 1 %   Abs Immature Granulocytes 0.10 (H) 0.00 - 0.07 K/uL    Comment: Performed at [facility], [address] [place], [place] [address]  Comprehensive metabolic panel     Status: Abnormal   Collection Time: 02/07/19  9:19 AM  Result Value Ref Range   Sodium 139 135 - 145 mmol/L   Potassium 3.7 3.5 - 5.1 mmol/L   Chloride 109 98 - 111  mmol/L   CO2 22 22 - 32 mmol/L   Glucose, Bld 110 (H) 70 - 99 mg/dL   BUN 13 8 - 23 mg/dL   Creatinine, Ser 4.40 0.44 - 1.00 mg/dL   Calcium 8.4 (L) 8.9 - 10.3 mg/dL   Total Protein 6.7 6.5 - 8.1 g/dL   Albumin 3.4 (L) 3.5 - 5.0 g/dL   AST 37 15 - 41 U/L   ALT 92 (H) 0 - 44 U/L   Alkaline Phosphatase 87 38 - 126 U/L   Total Bilirubin 0.6 0.3 - 1.2 mg/dL   GFR calc non Af Amer >60 >60 mL/min   GFR calc Af Amer >60 >60 mL/min   Anion gap 8 5 - 15    Comment: Performed at [facility], [address], [place], [place] [address]  Troponin I (High Sensitivity)     Status: None   Collection Time: 02/07/19  9:19 AM  Result Value Ref Range   Troponin I (High Sensitivity) 5 <18 ng/L    Comment: (NOTE) Elevated high sensitivity troponin I (hsTnI) values and significant  changes across serial measurements may suggest ACS but many other  chronic and acute conditions are known to elevate hsTnI results.  Refer to the "Links" section for chest pain algorithms and additional  guidance. Performed at [facility], [address] [place], [place] [address]   Troponin I (High Sensitivity)     Status: None   Collection Time: 02/07/19 12:57 PM  Result Value Ref Range   Troponin I (High Sensitivity) 5 <18 ng/L    Comment: (NOTE) Elevated high sensitivity troponin I (  hsTnI) values and significant  changes across serial measurements may suggest ACS but many other  chronic and acute conditions are known to elevate hsTnI results.  Refer to the "Links" section for chest pain algorithms and additional  guidance. Performed at [facility], [address] [place], [place] [address]   SARS CORONAVIRUS 2 (TAT 6-24 HRS) Nasopharyngeal Nasopharyngeal Swab     Status: None   Collection Time: 02/07/19  1:24 PM   Specimen: Nasopharyngeal Swab  Result Value Ref Range   SARS Coronavirus 2 NEGATIVE NEGATIVE    Comment: (NOTE) SARS-CoV-2 target nucleic acids are NOT  DETECTED. The SARS-CoV-2 RNA is generally detectable in upper and lower respiratory specimens during the acute phase of infection. Negative results do not preclude SARS-CoV-2 infection, do not rule out co-infections with other pathogens, and should not be used as the sole basis for treatment or other patient management decisions. Negative results must be combined with clinical observations, patient history, and epidemiological information. The expected result is Negative. Fact Sheet for Patients: HairSlick.no Fact Sheet for Healthcare Providers: quierodirigir.com This test is not yet approved or cleared by the [place] FDA and  has been authorized for detection and/or diagnosis of SARS-CoV-2 by FDA under an Emergency Use Authorization (EUA). This EUA will remain  in effect (meaning this test can be used) for the duration of the COVID-19 declaration under Section 56 4(b)(1) of the Act, 21 U.S.C. section 360bbb-3(b)(1), unless the authorization is terminated or revoked sooner. Performed at [facility], [address], [place], [place] [address]   Troponin I (High Sensitivity)     Status: None   Collection Time: 02/07/19  2:50 PM  Result Value Ref Range   Troponin I (High Sensitivity) 5 <18 ng/L    Comment: (NOTE) Elevated high sensitivity troponin I (hsTnI) values and significant  changes across serial measurements may suggest ACS but many other  chronic and acute conditions are known to elevate hsTnI results.  Refer to the "Links" section for chest pain algorithms and additional  guidance. Performed at [facility], [address] [place], [place] [address]   Glucose, capillary     Status: Abnormal   Collection Time: 02/07/19 11:34 PM  Result Value Ref Range   Glucose-Capillary 115 (H) 70 - 99 mg/dL  HIV Antibody (routine testing w rflx)     Status: None   Collection Time: 02/08/19  5:13 AM  Result  Value Ref Range   HIV Screen 4th Generation wRfx NON REACTIVE NON REACTIVE    Comment: Performed at [facility], [address] [place], [place] [address]  Lipid panel     Status: None   Collection Time: 02/08/19  5:13 AM  Result Value Ref Range   Cholesterol 107 0 - 200 mg/dL   Triglycerides 952 <841 mg/dL   HDL 49 >32 mg/dL   Total CHOL/HDL Ratio 2.2 RATIO   VLDL 28 0 - 40 mg/dL   LDL Cholesterol 30 0 - 99 mg/dL    Comment:        Total Cholesterol/HDL:CHD Risk Coronary Heart Disease Risk Table                     Men   Women  1/2 Average Risk   3.4   3.3  Average Risk       5.0   4.4  2 X Average Risk   9.6   7.1  3 X Average Risk  23.4   11.0        Use the calculated Patient Ratio above and the CHD Risk Table to determine the patient's CHD Risk.        ATP III CLASSIFICATION (LDL):  <100     mg/dL   Optimal  161-096100-129  mg/dL   Near or Above                    Optimal  130-159  mg/dL   Borderline  045-409160-189  mg/dL   High  >811>190     mg/dL   Very High Performed at [facility], [address] [place], [place] [address]   Basic metabolic panel     Status: Abnormal   Collection Time: 02/08/19  5:13 AM  Result Value Ref Range   Sodium 139 135 - 145 mmol/L   Potassium 3.5 3.5 - 5.1 mmol/L   Chloride 109 98 - 111 mmol/L   CO2 25 22 - 32 mmol/L   Glucose, Bld 119 (H) 70 - 99 mg/dL   BUN 14 8 - 23 mg/dL   Creatinine, Ser 2.950.76 0.44 - 1.00 mg/dL   Calcium 8.5 (L) 8.9 - 10.3 mg/dL   GFR calc non Af Amer >60 >60 mL/min   GFR calc Af Amer >60 >60 mL/min   Anion gap 5 5 - 15    Comment: Performed at [facility], [address] [place], [place] [address]  ECHOCARDIOGRAM COMPLETE     Status: None   Collection Time: 02/08/19 10:05 AM  Result Value Ref Range   Weight 4,739.2 oz   Height 68 in   BP 121/89 mmHg  Glucose, capillary     Status: Abnormal   Collection Time: 02/08/19 10:43 AM  Result Value Ref Range   Glucose-Capillary 127 (H) 70 - 99  mg/dL  Urinalysis, Complete w Microscopic     Status: Abnormal   Collection Time: 02/08/19 11:58 AM  Result Value Ref Range   Color, Urine YELLOW (A) YELLOW   APPearance CLEAR (A) CLEAR   Specific Gravity, Urine 1.009 1.005 - 1.030   pH 6.0 5.0 - 8.0   Glucose, UA NEGATIVE NEGATIVE mg/dL   Hgb urine dipstick NEGATIVE NEGATIVE   Bilirubin Urine NEGATIVE NEGATIVE   Ketones, ur NEGATIVE NEGATIVE mg/dL   Protein, ur NEGATIVE NEGATIVE mg/dL   Nitrite NEGATIVE NEGATIVE   Leukocytes,Ua NEGATIVE NEGATIVE   RBC / HPF 0-5 0 - 5 RBC/hpf   WBC, UA 0-5 0 - 5 WBC/hpf   Bacteria, UA MANY (A) NONE SEEN   Squamous Epithelial / LPF 0-5 0 - 5   Mucus PRESENT     Comment: Performed at [facility], [address] [place], [place] [address]  Urine Culture     Status: Abnormal   Collection Time: 02/08/19 11:58 AM   Specimen: Urine, Clean Catch  Result Value Ref Range   Specimen Description      URINE, CLEAN CATCH Performed at [facility], [address] [place], [place] [address]    Special Requests      Normal Performed at [facility], [address] [place], [place] [address]    Culture MULTIPLE SPECIES PRESENT, SUGGEST RECOLLECTION (A)    Report Status 02/09/2019 FINAL   Glucose, capillary     Status: None   Collection Time: 02/08/19  8:50 PM  Result Value Ref Range   Glucose-Capillary 85 70 - 99 mg/dL   Comment 1 Notify RN  Comment 2 Document in Chart     PHQ2/9: Depression screen [facility] 2/9 02/14/2019 02/03/2019 01/21/2019 12/24/2018 09/10/2018  Decreased Interest 0 1 1 1  0  Down, Depressed, Hopeless 0 2 2 1 1   PHQ - 2 Score 0 3 3 2 1   Altered sleeping 2 1 1 1 1   Tired, decreased energy 1 1 1 1 1   Change in appetite 0 2 2 2  0  Feeling bad or failure about yourself  0 1 1 2  0  Trouble concentrating 0 2 2 2 2   Moving slowly or fidgety/restless 0 0 0 0 1  Suicidal thoughts 0 0 0 - 0  PHQ-9 Score 3 10 10 10 6   Difficult doing work/chores Not difficult  at all - Somewhat difficult Somewhat difficult Somewhat difficult  Some encounter information is confidential and restricted. Go to Review Flowsheets activity to see all data.  Some recent data might be hidden    phq 9 is positive and negative   Fall Risk: Fall Risk  02/14/2019 01/21/2019 12/24/2018 09/10/2018 06/21/2018  Falls in the past year? 0 0 0 0 0  Number falls in past yr: 0 0 0 0 0  Injury with Fall? 0 0 0 0 0  Risk for fall due to : - - - - -  Follow up - - - - -    Functional Status Survey: Is the patient deaf or have difficulty hearing?: No Does the patient have difficulty seeing, even when wearing glasses/contacts?: No Does the patient have difficulty concentrating, remembering, or making decisions?: No Does the patient have difficulty walking or climbing stairs?: No Does the patient have difficulty dressing or bathing?: No Does the patient have difficulty doing errands alone such as visiting a doctor's office or shopping?: No    Assessment & Plan  1. Essential hypertension  Wait for follow up with cardiologist tomorrow   2. Atrial fibrillation with rapid ventricular response (HCC)  Rate controlled

## 2019-02-15 ENCOUNTER — Ambulatory Visit (INDEPENDENT_AMBULATORY_CARE_PROVIDER_SITE_OTHER): Payer: BC Managed Care – PPO | Admitting: Cardiology

## 2019-02-15 ENCOUNTER — Encounter: Payer: Self-pay | Admitting: Cardiology

## 2019-02-15 VITALS — BP 120/90 | HR 77 | Temp 96.8°F | Ht 68.0 in | Wt 285.2 lb

## 2019-02-15 DIAGNOSIS — I10 Essential (primary) hypertension: Secondary | ICD-10-CM

## 2019-02-15 DIAGNOSIS — I4891 Unspecified atrial fibrillation: Secondary | ICD-10-CM

## 2019-02-15 NOTE — Patient Instructions (Signed)
Medication Instructions:  - Your physician recommends that you continue on your current medications as directed. Please refer to the Current Medication list given to you today.  *If you need a refill on your cardiac medications before your next appointment, please call your pharmacy*  Lab Work: - none ordered  If you have labs (blood work) drawn today and your tests are completely normal, you will receive your results only by: Marland Kitchen MyChart Message (if you have MyChart) OR . A paper copy in the mail If you have any lab test that is abnormal or we need to change your treatment, we will call you to review the results.  Testing/Procedures: - none ordered  Follow-Up: At [facility], you and your health needs are our priority.  As part of our continuing mission to provide you with exceptional heart care, we have created designated Provider Care Teams.  These Care Teams include your primary Cardiologist (physician) and Advanced Practice Providers (APPs -  Physician Assistants and Nurse Practitioners) who all work together to provide you with the care you need, when you need it.  Your next appointment:   6 months  The format for your next appointment:   In Person  Provider:    You may see Kate Sable, MD or one of the following Advanced Practice Providers on your designated Care Team:    Murray Hodgkins, NP  Christell Faith, PA-C  Marrianne Mood, PA-C   Other Instructions - n/a

## 2019-02-15 NOTE — Progress Notes (Signed)
Cardiology Office Note:    Date:  02/15/2019   ID:  Regina QuaJosephine S Sabel, DOB 08/28/1955, MRN [id number]  PCP:  Alba CorySowles, Krichna, MD  Cardiologist:  Debbe OdeaBrian Agbor-Etang, MD  Electrophysiologist:  None   Referring MD: Alba CorySowles, Krichna, MD   Chief Complaint  Patient presents with  . office visit    [facility] F/U-A Fib    History of Present Illness:    Regina Christensen is a 63 y.o. female with a hx of hypertension, obesity, sleep apnea who presents for follow-up.  She was originally seen as a referral due to new onset atrial fibrillation.    She was started on Eliquis and metoprolol 25 mg twice daily.   A week ago she presented to the emergency room due to palpitations.  She was found to be in atrial fibrillation rapid ventricular response. Her metoprolol was increased to 100 mg twice daily on discharge.  Past Medical History:  Diagnosis Date  . Arthritis   . Depression   . Hypertension   . Morbid obesity with BMI of 45.0-49.9, adult (HCC)   . Obesity   . Sleep apnea     Past Surgical History:  Procedure Laterality Date  . ABDOMINAL HYSTERECTOMY    . BREAST EXCISIONAL BIOPSY Left   . ENDOMETRIAL ABLATION     x2  . GASTRIC BYPASS     sleeve    Current Medications: Current Meds  Medication Sig  . acetaminophen (TYLENOL 8 HOUR ARTHRITIS PAIN) 650 MG CR tablet Take 1 tablet (650 mg total) by mouth every 8 (eight) hours as needed for pain. (Patient taking differently: Take 1,300 mg by mouth every 8 (eight) hours as needed for pain. )  . albuterol (PROVENTIL HFA;VENTOLIN HFA) 108 (90 Base) MCG/ACT inhaler TAKE 2 PUFFS BY MOUTH EVERY 6 HOURS AS NEEDED FOR WHEEZE OR SHORTNESS OF BREATH  . apixaban (ELIQUIS) 5 MG TABS tablet Take 1 tablet (5 mg total) by mouth 2 (two) times daily.  Marland Kitchen. azelastine (ASTELIN) 0.1 % nasal spray PLACE 2 SPRAYS INTO BOTH NOSTRILS 2 (TWO) TIMES DAILY  . cloNIDine (CATAPRES - DOSED IN MG/24 HR) 0.1 mg/24hr patch PLACE 1 PATCH (0.1 MG TOTAL) ONTO THE SKIN ONCE A  WEEK.  . famotidine (PEPCID) 20 MG tablet Take 1 tablet (20 mg total) by mouth daily.  . fexofenadine (ALLEGRA) 180 MG tablet Take 180 mg by mouth daily.  . methocarbamol (ROBAXIN) 500 MG tablet Take 1 tablet (500 mg total) by mouth every 8 (eight) hours as needed for muscle spasms.  . metoprolol tartrate (LOPRESSOR) 100 MG tablet Take 1 tablet (100 mg total) by mouth 2 (two) times daily.  Marland Kitchen. oxyCODONE (OXY IR/ROXICODONE) 5 MG immediate release tablet Take 1 tablet (5 mg total) by mouth every 6 (six) hours as needed for moderate pain or breakthrough pain.  . Semaglutide, 1 MG/DOSE, (OZEMPIC, 1 MG/DOSE,) 2 MG/1.5ML SOPN Inject 1 mg into the skin once a week.  . triamcinolone (NASACORT) 55 MCG/ACT AERO nasal inhaler Place 2 sprays into the nose daily.  Marland Kitchen. triamcinolone cream (KENALOG) 0.1 % APPLY TO AFFECTRED AREA TWICE A DAYT  . venlafaxine XR (EFFEXOR XR) 75 MG 24 hr capsule Take 1 capsule (75 mg total) by mouth daily with breakfast. To be combined with 150 mg  . venlafaxine XR (EFFEXOR-XR) 150 MG 24 hr capsule To be combined with 75 mg  . XIIDRA 5 % SOLN Place 1 drop into both eyes 2 (two) times daily.     Allergies:  Ace inhibitors and Hctz [hydrochlorothiazide]   Social History   Socioeconomic History  . Marital status: Divorced    Spouse name: Not on file  . Number of children: 2  . Years of education: Not on file  . Highest education level: Associate degree: occupational, Scientist, product/process development, or vocational program  Occupational History  . Occupation: patient Tax adviser: [employer]  Social Needs  . Financial resource strain: Not hard at all  . Food insecurity    Worry: Never true    Inability: Never true  . Transportation needs    Medical: No    Non-medical: No  Tobacco Use  . Smoking status: Never Smoker  . Smokeless tobacco: Never Used  Substance and Sexual Activity  . Alcohol use: No    Alcohol/week: 0.0 standard drinks    Comment: rare  . Drug use: No  . Sexual  activity: Never  Lifestyle  . Physical activity    Days per week: 0 days    Minutes per session: 0 min  . Stress: Rather much  Relationships  . Social connections    Talks on phone: More than three times a week    Gets together: More than three times a week    Attends religious service: More than 4 times per year    Active member of club or organization: No    Attends meetings of clubs or organizations: Never    Relationship status: Divorced  Other Topics Concern  . 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 There is no history of Mental illness.  Her sister has atrial fibrillation, multiple nephews have atrial fibrillation.  ROS:   Please see the history of present illness.     All other systems reviewed and are negative.  EKGs/Labs/Other Studies Reviewed:    The following studies were reviewed today: Echo 02/08/2019  1. Left ventricular ejection fraction, by visual estimation, is 55 to 60%. The left ventricle has normal function. Normal left ventricular size. There is mildly increased left ventricular hypertrophy.  2. Definity contrast agent was given IV to delineate the left ventricular endocardial borders.  3. Left ventricular diastolic Doppler parameters are indeterminate pattern of LV diastolic filling.  4. Global right ventricle has low normal systolic function.The right ventricular size is not well visualized. No increase in right ventricular wall thickness.  5. Left atrial size was moderately dilated.  6. Right atrial size was moderately dilated.  7. Trivial pericardial effusion is present.  8. The mitral valve is normal in structure. Mild mitral valve regurgitation.  9. The tricuspid valve is normal in structure. Tricuspid  valve regurgitation moderate. 10. The aortic valve is tricuspid Aortic valve regurgitation was not visualized by color flow Doppler. There is no aortic valve stenosis. 11. The pulmonic valve was not well visualized. Pulmonic valve regurgitation is not visualized by color flow Doppler. 12. Normal pulmonary artery systolic pressure. 13. The inferior vena cava is dilated in size with <50% respiratory variability, suggesting right atrial pressure of 15 mmHg. 14. The interatrial septum was not well visualized.  EKG:  EKG is  ordered today.  The ekg ordered today demonstrates normal sinus rhythm, PACs.  Recent Labs: 02/03/2019:  TSH 1.288 02/07/2019: ALT 92; Hemoglobin 13.0; Platelets 374 02/08/2019: BUN 14; Creatinine, Ser 0.76; Potassium 3.5; Sodium 139  Recent Lipid Panel    Component Value Date/Time   CHOL 107 02/08/2019 0513   CHOL 151 12/05/2014 1117   TRIG 139 02/08/2019 0513   HDL 49 02/08/2019 0513   HDL 74 12/05/2014 1117   CHOLHDL 2.2 02/08/2019 0513   VLDL 28 02/08/2019 0513   LDLCALC 30 02/08/2019 0513   LDLCALC 63 02/05/2018 1532    Physical Exam:    VS:  BP 120/90 (BP Location: Left Arm, Patient Position: Sitting, Cuff Size: Large)   Pulse 77   Temp (!) 96.8 F (36 C)   Ht  (1.727 m)   Wt 285 lb 4 oz (129.4 kg)   SpO2 96%   BMI 43.37 kg/m     Wt Readings from Last 3 Encounters:  02/15/19 285 lb 4 oz (129.4 kg)  02/14/19 284 lb 4.8 oz (129 kg)  02/09/19 297 lb 11.2 oz (135 kg)     GEN:  Well nourished, well developed in no acute distress HEENT: Normal NECK: No JVD; No carotid bruits LYMPHATICS: No lymphadenopathy CARDIAC:RRR, no murmurs, rubs, gallops RESPIRATORY:  Clear to auscultation without rales, wheezing or rhonchi  ABDOMEN: Soft, non-tender, non-distended MUSCULOSKELETAL:  No edema; No deformity  SKIN: Warm and dry NEUROLOGIC:  Alert and oriented x 3 PSYCHIATRIC:  Normal affect   ASSESSMENT:   Patient with paroxysmal atrial fibrillation.   She is currently in sinus rhythm.  CHA2DS2-VASc score is 2(hypertension, gender).  Heart rate controlled with increased dose of beta-blocker.  Her echocardiogram shows normal ejection fraction with moderately dilated left and right atrium.  Blood pressure is well controlled on increased dose of beta-blocker. She takes a clonidine patch  1. Atrial fibrillation, unspecified type (HCC)   2. Essential hypertension    PLAN:     1. Continue Eliquis 5 mg twice daily, continue Lopressor 100 mg twice daily.  2. Stop amlodipine and olmesartan combination pill due to increased dose of beta-blocker. will prefer another blood pressure agent instead of clonidine due to risk of rebound.  Will defer to her primary care physician for hypertension management since patient sees primary md more often. 3. Weight loss advised.   Follow-up 6 months  Total encounter time more than 25 minutes  Greater than 50% was spent in counseling and coordination of care with the patient   This note was generated in part or whole with voice recognition software. Voice recognition is usually quite accurate but there are transcription errors that can and very often do occur. I apologize for any typographical errors that were not detected and corrected.   This note was generated in part or whole with voice recognition software. Voice recognition is usually quite accurate but there are transcription errors that can and very often do occur. I apologize for any typographical errors that were not detected and corrected.  Medication Adjustments/Labs and Tests Ordered: Current medicines are reviewed at length with the patient today.  Concerns regarding medicines are outlined above.  Orders Placed This Encounter  Procedures  . EKG 12-Lead   No orders of the defined types were placed in this encounter.   Patient Instructions  Medication Instructions:  - Your physician recommends that you continue on your current medications as  directed. Please refer to the Current Medication list given to you today.  *If you need a refill on your cardiac medications before your next appointment, please  call your pharmacy*  Lab Work: - none ordered  If you have labs (blood work) drawn today and your tests are completely normal, you will receive your results only by: Marland Kitchen MyChart Message (if you have MyChart) OR . A paper copy in the mail If you have any lab test that is abnormal or we need to change your treatment, we will call you to review the results.  Testing/Procedures: - none ordered  Follow-Up: At [facility], you and your health needs are our priority.  As part of our continuing mission to provide you with exceptional heart care, we have created designated Provider Care Teams.  These Care Teams include your primary Cardiologist (physician) and Advanced Practice Providers (APPs -  Physician Assistants and Nurse Practitioners) who all work together to provide you with the care you need, when you need it.  Your next appointment:   6 months  The format for your next appointment:   In Person  Provider:    You may see Kate Sable, MD or one of the following Advanced Practice Providers on your designated Care Team:    Murray Hodgkins, NP  Christell Faith, PA-C  Marrianne Mood, PA-C   Other Instructions - n/a     Signed, Kate Sable, MD  02/15/2019 9:35 AM    [place]

## 2019-02-21 ENCOUNTER — Ambulatory Visit (INDEPENDENT_AMBULATORY_CARE_PROVIDER_SITE_OTHER): Payer: BC Managed Care – PPO | Admitting: Psychiatry

## 2019-02-21 ENCOUNTER — Encounter: Payer: Self-pay | Admitting: Psychiatry

## 2019-02-21 ENCOUNTER — Other Ambulatory Visit: Payer: Self-pay

## 2019-02-21 DIAGNOSIS — F330 Major depressive disorder, recurrent, mild: Secondary | ICD-10-CM

## 2019-02-21 DIAGNOSIS — F411 Generalized anxiety disorder: Secondary | ICD-10-CM | POA: Diagnosis not present

## 2019-02-21 NOTE — Progress Notes (Signed)
Virtual Visit via Video Note  I connected with Regina QuaJosephine S Chaikin on 02/21/19 at 11:30 AM EST by a video enabled telemedicine application and verified that I am speaking with the correct person using two identifiers.   I discussed the limitations of evaluation and management by telemedicine and the availability of in person appointments. The patient expressed understanding and agreed to proceed.    I discussed the assessment and treatment plan with the patient. The patient was provided an opportunity to ask questions and all were answered. The patient agreed with the plan and demonstrated an understanding of the instructions.   The patient was advised to call back or seek an in-person evaluation if the symptoms worsen or if the condition fails to improve as anticipated.   BH MD OP Progress Note  02/21/2019 3:32 PM Regina Christensen  MRN:  [id number]  Chief Complaint:  Chief Complaint    Follow-up     HPI: Regina Christensen is a 63 year old African-American female, divorced, employed, lives in [place], has a history of GAD, MDD, hypertension, gastric bypass, hyperlipidemia, OSA, arthritis was evaluated by telemedicine today.  Patient today reports she is currently making progress with regards to her anxiety or depressive symptoms.  She denies any significant sadness, anhedonia or crying spells at this time.  She is compliant on her venlafaxine as prescribed.  She denies side effects.  Patient reports she had a recent emergency department visit for racing heart rate-she does have atrial fibrillation.  She was referred to a cardiologist and she had her follow-up with cardiology.  She reports she is currently on metoprolol.  She reports she currently does not have any symptoms and is doing okay.  Patient reports she was worried about her retirement however that has been taken care of and she will be retiring in January.  That does improve her mood symptoms and has helped her to feel better a  lot.  Patient denies any other concerns today.  She does have continued support system from her family.   Visit Diagnosis:    ICD-10-CM   1. GAD (generalized anxiety disorder)  F41.1    improving  2. MDD (major depressive disorder), recurrent episode, mild (HCC)  F33.0    improving    Past Psychiatric History: Reviewed past psychiatric history from my progress note on 01/27/2019.  Past trials of Prozac, Effexor.  Past Medical History:  Past Medical History:  Diagnosis Date  . Arthritis   . Depression   . Hypertension   . Morbid obesity with BMI of 45.0-49.9, adult (HCC)   . Obesity   . Sleep apnea     Past Surgical History:  Procedure Laterality Date  . ABDOMINAL HYSTERECTOMY    . BREAST EXCISIONAL BIOPSY Left   . ENDOMETRIAL ABLATION     x2  . GASTRIC BYPASS     sleeve    Family Psychiatric History: Reviewed family psychiatric history from my progress note on 01/27/2019 Family History:  Family History  Problem Relation Age of Onset  . Heart disease Father   . Cancer Brother   . Mental illness Neg Hx     Social History: Reviewed social history from my progress note on 01/27/2019 Social History   Socioeconomic History  . Marital status: Divorced    Spouse name: Not on file  . Number of children: 2  . Years of education: Not on file  . Highest education level: Associate degree: occupational, Scientist, product/process developmenttechnical, or vocational program  Occupational History  . Occupation: patient  financial services    Employer: [employer]  Social Needs  . Financial resource strain: Not hard at all  . Food insecurity    Worry: Never true    Inability: Never true  . Transportation needs    Medical: No    Non-medical: No  Tobacco Use  . Smoking status: Never Smoker  . Smokeless tobacco: Never Used  Substance and Sexual Activity  . Alcohol use: No    Alcohol/week: 0.0 standard drinks    Comment: rare  . Drug use: No  . Sexual activity: Never  Lifestyle  . Physical activity    Days per  week: 0 days    Minutes per session: 0 min  . Stress: Rather much  Relationships  . Social connections    Talks on phone: More than three times a week    Gets together: More than three times a week    Attends religious service: More than 4 times per year    Active member of club or organization: No    Attends meetings of clubs or organizations: Never    Relationship status: Divorced  Other Topics Concern  . 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 Ace Inhibitors Cough  . Hctz [Hydrochlorothiazide] Rash    face    Metabolic Disorder Labs: Lab Results  Component Value Date   HGBA1C 6.3 (H) 02/03/2019   MPG 134.11 02/03/2019   MPG 120 02/05/2018   No results found for: PROLACTIN Lab Results  Component Value Date   CHOL 107 02/08/2019   TRIG 139 02/08/2019   HDL 49 02/08/2019   CHOLHDL 2.2 02/08/2019   VLDL 28 02/08/2019   LDLCALC 30 02/08/2019   LDLCALC 36 02/03/2019   Lab Results  Component Value Date   TSH 1.288 02/03/2019   TSH 0.807 12/05/2014    Therapeutic Level Labs: No results found for: LITHIUM No results found for: VALPROATE No components found for:  CBMZ  Current Medications: Current Outpatient Medications  Medication Sig Dispense Refill  . acetaminophen (TYLENOL 8 HOUR ARTHRITIS PAIN) 650 MG CR tablet Take 1 tablet (650 mg total) by mouth every 8 (eight) hours as needed for pain. (Patient taking differently: Take 1,300 mg by mouth every 8 (eight) hours as needed for pain. ) 90 tablet 0  . albuterol (PROVENTIL HFA;VENTOLIN HFA) 108 (90 Base) MCG/ACT inhaler TAKE 2 PUFFS BY MOUTH EVERY 6 HOURS AS NEEDED FOR WHEEZE OR SHORTNESS OF BREATH 8.5 Inhaler 0  . apixaban (ELIQUIS) 5 MG TABS tablet Take 1 tablet (5 mg total) by mouth 2 (two) times daily. 60 tablet 5  .  Armodafinil 150 MG tablet Take 1 tablet (150 mg total) by mouth daily. (Patient not taking: Reported on 02/14/2019) 30 tablet 2  . azelastine (ASTELIN) 0.1 % nasal spray PLACE 2 SPRAYS INTO BOTH NOSTRILS 2 (TWO) TIMES DAILY 30 mL 1  . cloNIDine (CATAPRES - DOSED IN MG/24 HR) 0.1 mg/24hr patch PLACE 1 PATCH (0.1 MG TOTAL) ONTO THE SKIN ONCE A WEEK. 12 patch 1  . famotidine (PEPCID) 20 MG tablet Take 1 tablet (20 mg total) by mouth daily. 90 tablet 0  . fexofenadine (ALLEGRA) 180 MG tablet Take 180 mg by mouth daily.    . methocarbamol (ROBAXIN) 500  MG tablet Take 1 tablet (500 mg total) by mouth every 8 (eight) hours as needed for muscle spasms. 20 tablet 0  . metoprolol tartrate (LOPRESSOR) 100 MG tablet Take 1 tablet (100 mg total) by mouth 2 (two) times daily. 60 tablet 0  . oxyCODONE (OXY IR/ROXICODONE) 5 MG immediate release tablet Take 1 tablet (5 mg total) by mouth every 6 (six) hours as needed for moderate pain or breakthrough pain. 12 tablet 0  . Semaglutide, 1 MG/DOSE, (OZEMPIC, 1 MG/DOSE,) 2 MG/1.5ML SOPN Inject 1 mg into the skin once a week. 9 mL 1  . triamcinolone (NASACORT) 55 MCG/ACT AERO nasal inhaler Place 2 sprays into the nose daily.    Marland Kitchen triamcinolone cream (KENALOG) 0.1 % APPLY TO AFFECTRED AREA TWICE A DAYT 80 g 0  . venlafaxine XR (EFFEXOR XR) 75 MG 24 hr capsule Take 1 capsule (75 mg total) by mouth daily with breakfast. To be combined with 150 mg 90 capsule 0  . venlafaxine XR (EFFEXOR-XR) 150 MG 24 hr capsule To be combined with 75 mg 90 capsule 0  . XIIDRA 5 % SOLN Place 1 drop into both eyes 2 (two) times daily.  4   No current facility-administered medications for this visit.      Musculoskeletal: Strength & Muscle Tone: UTA Gait & Station: Reports as WNL Patient leans: N/A  Psychiatric Specialty Exam: Review of Systems  Psychiatric/Behavioral: Negative for depression, hallucinations, substance abuse and suicidal ideas. The patient is not nervous/anxious and  does not have insomnia.   All other systems reviewed and are negative.   There were no vitals taken for this visit.There is no height or weight on file to calculate BMI.  General Appearance: Casual  Eye Contact:  Fair  Speech:  Clear and Coherent  Volume:  Normal  Mood:  Euthymic  Affect:  Congruent  Thought Process:  Goal Directed and Descriptions of Associations: Intact  Orientation:  Full (Time, Place, and Person)  Thought Content: Logical   Suicidal Thoughts:  No  Homicidal Thoughts:  No  Memory:  Immediate;   Fair Recent;   Fair Remote;   Fair  Judgement:  Fair  Insight:  Fair  Psychomotor Activity:  Normal  Concentration:  Concentration: Fair and Attention Span: Fair  Recall:  [employer] of Knowledge: Fair  Language: Fair  Akathisia:  No  Handed:  Right  AIMS (if indicated): denies tremors, rigidity  Assets:  Communication Skills Desire for Improvement Social Support  ADL's:  Intact  Cognition: WNL  Sleep:  Improved   Screenings: GAD-7     [facility] Visit from 01/27/2019 in [facility] Office Visit from 01/21/2019 in [facility] Office Visit from 06/21/2018 in [facility] Office Visit from 04/26/2018 in [facility] Office Visit from 02/05/2018 in [facility]  Total GAD-7 Score  10  6  0  0  0    PHQ2-9     Office Visit from 02/14/2019 in [facility] Office Visit from 02/03/2019 in [facility] Office Visit from 01/27/2019 in [facility] Office Visit from 01/21/2019 in [facility] Office Visit from 12/24/2018 in [facility]  PHQ-2 Total Score  0  PHQ-9 Total Score  Assessment and Plan: Sinahi is a 63 year old African-American female,  divorced, employed, lives in [place], has a history of anxiety, depression, hypertension,  hyperlipidemia, OSA, arthritis was evaluated by telemedicine today.  She is biologically predisposed given her health problems.  She does have psychosocial stressors of work-related problems at the current pandemic.  She however is currently making progress and continues to have good support system.  Plan as noted below.  Plan GAD-improving Venlafaxine XR 225 mg p.o. daily Continue psychotherapy sessions with her therapist-grounded for peace  MDD-improving Effexor will help. Patient reports sleep problems is improved. Melatonin 1 to 3 mg p.o. nightly as needed Encouraged to use CPAP.  TSH-reviewed-dated 02/03/2019-within normal limits.  Pending records from her therapist.  Follow-up in clinic in 6 to 8 weeks or sooner if needed.  December 28 at 2 PM  I have spent atleast 15 minutes non face to face with patient today. More than 50 % of the time was spent for psychoeducation and supportive psychotherapy and care coordination. This note was generated in part or whole with voice recognition software. Voice recognition is usually quite accurate but there are transcription errors that can and very often do occur. I apologize for any typographical errors that were not detected and corrected.       Jomarie Longs, MD 02/21/2019, 3:32 PM

## 2019-02-28 ENCOUNTER — Ambulatory Visit: Payer: BC Managed Care – PPO | Admitting: Family Medicine

## 2019-03-02 ENCOUNTER — Ambulatory Visit (INDEPENDENT_AMBULATORY_CARE_PROVIDER_SITE_OTHER): Payer: BC Managed Care – PPO | Admitting: Family Medicine

## 2019-03-02 ENCOUNTER — Encounter: Payer: Self-pay | Admitting: Family Medicine

## 2019-03-02 ENCOUNTER — Other Ambulatory Visit: Payer: Self-pay

## 2019-03-02 DIAGNOSIS — I10 Essential (primary) hypertension: Secondary | ICD-10-CM

## 2019-03-02 DIAGNOSIS — D72829 Elevated white blood cell count, unspecified: Secondary | ICD-10-CM

## 2019-03-02 DIAGNOSIS — I4891 Unspecified atrial fibrillation: Secondary | ICD-10-CM | POA: Diagnosis not present

## 2019-03-02 DIAGNOSIS — F330 Major depressive disorder, recurrent, mild: Secondary | ICD-10-CM

## 2019-03-02 DIAGNOSIS — F411 Generalized anxiety disorder: Secondary | ICD-10-CM

## 2019-03-02 NOTE — Progress Notes (Signed)
Name: Regina Christensen   MRN: [id number]    DOB: 12/27/1955   Date:03/02/2019       Progress Note  Subjective  Chief Complaint  Chief Complaint  Patient presents with  . Hypertension  . Fatigue    She is getting tired quicker than usual.    I connected with  Regina Christensen  on 03/02/19 at 10:00 AM EST by a video enabled telemedicine application and verified that I am speaking with the correct person using two identifiers.  I discussed the limitations of evaluation and management by telemedicine and the availability of in person appointments. The patient expressed understanding and agreed to proceed. Staff also discussed with the patient that there may be a patient responsible charge related to this service. Patient Location: at home  [facility]   HPI  Afib: she states since she has been on higher dose of Metoprolol at 100 mg BID she only had two  episodes of palpitation since end of October, lasted a couple of minutes and resolved by itself. She denies association with sob or chest pain. She has been taking Eliquis and denies bleeding. She states she has sob with activity ( like when she goes to a store) symptoms improves when she sits down . Explained fatigue likely secondary to afib   MDD/GAD: she is seeing Dr. Elna BreslowEappen and also therapist. She went back to work on October 28th, 2020. Work is still very stressful but she has been coping better since she is near her retirement She has been taking medication as prescribed. Denies side effects.   Leucocytosis: discussed repeating level, no fever or chills  Insomnia: she states Dr. Elna BreslowEappen recommended Melatonin and she wanted to ask me if okay , could not see interaction with medications on Epocrates . Discussed loving and kindness meditation , and reminded her of sleep hygiene    HTN: she was not able to check bp at home, she will stop by for a nurse visit and bp check.   SWSD: she has not been  taking provigil, advised to stay off because of new onset afib   Patient Active Problem List   Diagnosis Date Noted  . A-fib (HCC) 02/08/2019  . Atrial fibrillation with rapid ventricular response (HCC) 02/07/2019  . Atrial fibrillation, new onset (HCC) 02/03/2019  . Bilateral carpal tunnel syndrome 07/30/2016  . OSA (obstructive sleep apnea) 03/07/2016  . MDD (major depressive disorder), recurrent episode, mild (HCC) 11/30/2015  . Osteoarthritis of both knees 11/30/2015  . BPPV (benign paroxysmal positional vertigo) 08/28/2015  . Essential hypertension 11/09/2014  . Hyperglycemia 11/09/2014  . GAD (generalized anxiety disorder) 11/09/2014  . Acanthosis nigricans 11/09/2014  . Morbid obesity (HCC) 01/06/2012    Past Surgical History:  Procedure Laterality Date  . ABDOMINAL HYSTERECTOMY    . BREAST EXCISIONAL BIOPSY Left   . ENDOMETRIAL ABLATION     x2  . GASTRIC BYPASS     sleeve    Family History  Problem Relation Age of Onset  . Heart disease Father   . Cancer Brother   . Mental illness Neg Hx     Social History   Socioeconomic History  . Marital status: Divorced    Spouse name: Not on file  . Number of children: 2  . Years of education: Not on file  . Highest education level: Associate degree: occupational, Scientist, product/process developmenttechnical, or vocational program  Occupational History  . Occupation: patient Tax adviserfinancial services    Employer: [employer]  Social Needs  . Financial resource strain: Not hard at all  . Food insecurity    Worry: Never true    Inability: Never true  . Transportation needs    Medical: No    Non-medical: No  Tobacco Use  . Smoking status: Never Smoker  . Smokeless tobacco: Never Used  Substance and Sexual Activity  . Alcohol use: No    Alcohol/week: 0.0 standard drinks    Comment: rare  . Drug use: No  . Sexual activity: Never  Lifestyle  . Physical activity    Days per week: 0 days    Minutes per session: 0 min  . Stress: Rather much  Relationships  .  Social connections    Talks on phone: More than three times a week    Gets together: More than three times a week    Attends religious service: More than 4 times per year    Active member of club or organization: No    Attends meetings of clubs or organizations: Never    Relationship status: Divorced  . Intimate partner violence    Fear of current or ex partner: No    Emotionally abused: No    Physically abused: No    Forced sexual activity: No  Other Topics Concern  . 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  acetaminophen (TYLENOL 8 HOUR ARTHRITIS PAIN) 650 MG CR tablet, Take 1 tablet (650 mg total) by mouth every 8 (eight) hours as needed for pain. (Patient taking differently: Take 1,300 mg by mouth every 8 (eight) hours as needed for pain. ), Disp: 90 tablet, Rfl: 0 .  albuterol (PROVENTIL HFA;VENTOLIN HFA) 108 (90 Base) MCG/ACT inhaler, TAKE 2 PUFFS BY MOUTH EVERY 6 HOURS AS NEEDED FOR WHEEZE OR SHORTNESS OF BREATH, Disp: 8.5 Inhaler, Rfl: 0 .  apixaban (ELIQUIS) 5 MG TABS tablet, Take 1 tablet (5 mg total) by mouth 2 (two) times daily., Disp: 60 tablet, Rfl: 5 .  azelastine (ASTELIN) 0.1 % nasal spray, PLACE 2 SPRAYS INTO BOTH NOSTRILS 2 (TWO) TIMES DAILY, Disp: 30 mL, Rfl: 1 .  cloNIDine (CATAPRES - DOSED IN MG/24 HR) 0.1 mg/24hr patch, PLACE 1 PATCH (0.1 MG TOTAL) ONTO THE SKIN ONCE A WEEK., Disp: 12 patch, Rfl: 1 .  famotidine (PEPCID) 20 MG tablet, Take 1 tablet (20 mg total) by mouth daily., Disp: 90 tablet, Rfl: 0 .  fexofenadine (ALLEGRA) 180 MG tablet, Take 180 mg by mouth daily., Disp: , Rfl:  .  methocarbamol (ROBAXIN) 500 MG tablet, Take 1 tablet (500 mg total) by mouth every 8 (eight) hours as needed for muscle spasms., Disp: 20 tablet, Rfl: 0 .  metoprolol tartrate (LOPRESSOR) 100 MG tablet,  Take 1 tablet (100 mg total) by mouth 2 (two) times daily., Disp: 60 tablet, Rfl: 0 .  oxyCODONE (OXY IR/ROXICODONE) 5 MG immediate release tablet, Take 1 tablet (5 mg total) by mouth every 6 (six) hours as needed for moderate pain or breakthrough pain., Disp: 12 tablet, Rfl: 0 .  Semaglutide, 1 MG/DOSE, (OZEMPIC, 1 MG/DOSE,) 2 MG/1.5ML SOPN, Inject 1 mg into the skin once a week., Disp: 9 mL, Rfl: 1 .  triamcinolone (NASACORT) 55 MCG/ACT AERO nasal inhaler, Place 2 sprays into the nose daily., Disp: ,  Rfl:  .  triamcinolone cream (KENALOG) 0.1 %, APPLY TO AFFECTRED AREA TWICE A DAYT, Disp: 80 g, Rfl: 0 .  venlafaxine XR (EFFEXOR XR) 75 MG 24 hr capsule, Take 1 capsule (75 mg total) by mouth daily with breakfast. To be combined with 150 mg, Disp: 90 capsule, Rfl: 0 .  venlafaxine XR (EFFEXOR-XR) 150 MG 24 hr capsule, To be combined with 75 mg, Disp: 90 capsule, Rfl: 0 .  XIIDRA 5 % SOLN, Place 1 drop into both eyes 2 (two) times daily., Disp: , Rfl: 4 .  Armodafinil 150 MG tablet, Take 1 tablet (150 mg total) by mouth daily. (Patient not taking: Reported on 02/14/2019), Disp: 30 tablet, Rfl: 2  Allergies  Allergen Reactions  . Ace Inhibitors Cough  . Hctz [Hydrochlorothiazide] Rash    face    I personally reviewed active problem list, medication list, allergies, family history, social history, health maintenance with the patient/caregiver today.   ROS  Ten systems reviewed and is negative except as mentioned in HPI   Objective  Virtual encounter, vitals not obtained.  There is no height or weight on file to calculate BMI.  Physical Exam  Awake, alert and oriented   PHQ2/9: Depression screen [facility] 2/9 03/02/2019 02/14/2019 02/03/2019 01/21/2019 12/24/2018  Decreased Interest 0 0 1 1 1   Down, Depressed, Hopeless 1 0 2 2 1   PHQ - 2 Score 1 0 3 3 2   Altered sleeping 1 2 1 1 1   Tired, decreased energy 1 1 1 1 1   Change in appetite 1 0 2 2 2   Feeling bad or failure about yourself  0 0  1 1 2   Trouble concentrating 1 0 2 2 2   Moving slowly or fidgety/restless 0 0 0 0 0  Suicidal thoughts 0 0 0 0 -  PHQ-9 Score 5 3 10 10 10   Difficult doing work/chores Very difficult Not difficult at all - Somewhat difficult Somewhat difficult  Some encounter information is confidential and restricted. Go to Review Flowsheets activity to see all data.  Some recent data might be hidden   PHQ-2/9 Result is positive.    Fall Risk: Fall Risk  03/02/2019 02/14/2019 01/21/2019 12/24/2018 09/10/2018  Falls in the past year? 0 0 0 0 0  Number falls in past yr: 0 0 0 0 0  Injury with Fall? 0 0 0 0 0  Risk for fall due to : - - - - -  Follow up - - - - -    Assessment & Plan  1. Atrial fibrillation with rapid ventricular response ([place])  Keep follow up with Cardiologist - rate controlled now   2. GAD (generalized anxiety disorder)  Doing better  3. Essential hypertension  Taking medication, she was unable to take her bp at home, advised to stop by for a nurse visit  4. Depression, major, recurrent, mild ([place])  Continue follow up with psychiatrist   5. Leukocytosis, unspecified type  - CBC with Differential/Platelet   I discussed the assessment and treatment plan with the patient. The patient was provided an opportunity to ask questions and all were answered. The patient agreed with the plan and demonstrated an understanding of the instructions.  The patient was advised to call back or seek an in-person evaluation if the symptoms worsen or if the condition fails to improve as anticipated.  I provided 25  minutes of non-face-to-face time during this encounter.

## 2019-03-06 ENCOUNTER — Other Ambulatory Visit: Payer: Self-pay | Admitting: Family Medicine

## 2019-03-06 DIAGNOSIS — E8881 Metabolic syndrome: Secondary | ICD-10-CM

## 2019-03-07 ENCOUNTER — Ambulatory Visit (INDEPENDENT_AMBULATORY_CARE_PROVIDER_SITE_OTHER): Payer: BC Managed Care – PPO

## 2019-03-07 ENCOUNTER — Other Ambulatory Visit: Payer: Self-pay

## 2019-03-07 VITALS — BP 136/78 | HR 67

## 2019-03-07 DIAGNOSIS — Z0130 Encounter for examination of blood pressure without abnormal findings: Secondary | ICD-10-CM

## 2019-03-07 LAB — CBC WITH DIFFERENTIAL/PLATELET
Absolute Monocytes: 621 cells/uL (ref 200–950)
Basophils Absolute: 72 cells/uL (ref 0–200)
Basophils Relative: 0.8 %
Eosinophils Absolute: 144 cells/uL (ref 15–500)
Eosinophils Relative: 1.6 %
HCT: 41.6 % (ref 35.0–45.0)
Hemoglobin: 13.7 g/dL (ref 11.7–15.5)
Lymphs Abs: 1773 cells/uL (ref 850–3900)
MCH: 28.8 pg (ref 27.0–33.0)
MCHC: 32.9 g/dL (ref 32.0–36.0)
MCV: 87.6 fL (ref 80.0–100.0)
MPV: 11.3 fL (ref 7.5–12.5)
Monocytes Relative: 6.9 %
Neutro Abs: 6390 cells/uL (ref 1500–7800)
Neutrophils Relative %: 71 %
Platelets: 329 10*3/uL (ref 140–400)
RBC: 4.75 10*6/uL (ref 3.80–5.10)
RDW: 14.4 % (ref 11.0–15.0)
Total Lymphocyte: 19.7 %
WBC: 9 10*3/uL (ref 3.8–10.8)

## 2019-03-07 NOTE — Progress Notes (Signed)
Patient is here for a blood pressure check. Patient denies chest pain, palpitations, shortness of breath or visual disturbances. At previous visit blood pressure was 168/94 with a heart rate of 82. Today during nurse visit first check blood pressure was 136 78 with hr of 67. She does take blood pressure medications as prescribed with no missed doses.

## 2019-03-10 ENCOUNTER — Other Ambulatory Visit: Payer: BC Managed Care – PPO

## 2019-03-15 ENCOUNTER — Other Ambulatory Visit: Payer: Self-pay | Admitting: Cardiology

## 2019-03-15 DIAGNOSIS — I4891 Unspecified atrial fibrillation: Secondary | ICD-10-CM

## 2019-03-15 MED ORDER — METOPROLOL TARTRATE 100 MG PO TABS: 100.0000 mg | ORAL_TABLET | Freq: Two times a day (BID) | ORAL | 1 refills | Status: DC

## 2019-03-15 MED ORDER — APIXABAN 5 MG PO TABS: 5.0000 mg | ORAL_TABLET | Freq: Two times a day (BID) | ORAL | 2 refills | Status: DC

## 2019-03-15 NOTE — Telephone Encounter (Signed)
Please review for Eliquis refill request.   Requested Prescriptions   Signed Prescriptions Disp Refills  . metoprolol tartrate (LOPRESSOR) 100 MG tablet 180 tablet 1    Sig: Take 1 tablet (100 mg total) by mouth 2 (two) times daily.    Authorizing Provider: Kate Sable    Ordering User: Raelene Bott, BRANDY L

## 2019-03-15 NOTE — Telephone Encounter (Signed)
Eliquis 5mg  refill request received, pt is 63yrs old, weight-129.4kg, Crea-0.76 on 02/08/2019, Diagnosis-Afib, and last seen by Dr. Garen Lah on 02/15/2019. Dose is appropriate based on dosing criteria. Will send in refill to requested pharmacy.

## 2019-03-15 NOTE — Telephone Encounter (Signed)
°*  STAT* If patient is at the pharmacy, call can be transferred to refill team.   1. Which medications need to be refilled? (please list name of each medication and dose if known)  Metoprolol 100 mg po BID  Eliquis 5 mg po BID  2. Which pharmacy/location (including street and city if local pharmacy) is medication to be sent to?   [facility] [address] [place]   3. Do they need a 30 day or 90 day supply?  [place]

## 2019-03-25 ENCOUNTER — Ambulatory Visit: Payer: BC Managed Care – PPO | Admitting: Cardiology

## 2019-03-30 ENCOUNTER — Other Ambulatory Visit: Payer: Self-pay

## 2019-03-30 ENCOUNTER — Encounter: Payer: Self-pay | Admitting: Family Medicine

## 2019-03-30 ENCOUNTER — Ambulatory Visit (INDEPENDENT_AMBULATORY_CARE_PROVIDER_SITE_OTHER): Payer: BC Managed Care – PPO | Admitting: Family Medicine

## 2019-03-30 DIAGNOSIS — R0989 Other specified symptoms and signs involving the circulatory and respiratory systems: Secondary | ICD-10-CM | POA: Diagnosis not present

## 2019-03-30 DIAGNOSIS — Z20822 Contact with and (suspected) exposure to covid-19: Secondary | ICD-10-CM

## 2019-03-30 DIAGNOSIS — J989 Respiratory disorder, unspecified: Secondary | ICD-10-CM

## 2019-03-30 DIAGNOSIS — R059 Cough, unspecified: Secondary | ICD-10-CM

## 2019-03-30 DIAGNOSIS — R05 Cough: Secondary | ICD-10-CM | POA: Diagnosis not present

## 2019-03-30 MED ORDER — GUAIFENESIN ER 600 MG PO TB12: 600.0000 mg | ORAL_TABLET | Freq: Two times a day (BID) | ORAL | 0 refills | Status: DC

## 2019-03-30 MED ORDER — ALBUTEROL SULFATE HFA 108 (90 BASE) MCG/ACT IN AERS: INHALATION_SPRAY | RESPIRATORY_TRACT | 2 refills | Status: DC

## 2019-03-30 MED ORDER — BENZONATATE 100 MG PO CAPS: 100.0000 mg | ORAL_CAPSULE | Freq: Three times a day (TID) | ORAL | 0 refills | Status: DC | PRN

## 2019-03-30 NOTE — Progress Notes (Signed)
Name: Regina Christensen   MRN: [id number]    DOB: 1955-09-24   Date:03/30/2019       Progress Note  Subjective  Chief Complaint  Chief Complaint  Patient presents with  . Cough    I connected with  Mauricio Po on 03/30/19 at 11:40 AM EST by telephone and verified that I am speaking with the correct person using two identifiers.   I discussed the limitations, risks, security and privacy concerns of performing an evaluation and management service by telephone and the availability of in person appointments. Staff also discussed with the patient that there may be a patient responsible charge related to this service. Patient Location: Home Provider Location: Office Additional Individuals present: None  HPI  Pt presents with concern for cough.  She notes long history of allergies causing a cough that results in needing medical care.  She notes last night she was coughing so much that it caused a bit of a headache - this has since improved.  She endorses nasal congestion, post-nasal drainage, some chest congestion.  Denies fevers/chills, NVD, abdominal pain, loss of taste or smell, no known exposure to COVID-19.  She is working from home currently. Symptoms started yesterday - we will send for COVID-19 testing.  Taking allegra daily, has albuterol PRN, taking azelastine and nasacort.  Patient Active Problem List   Diagnosis Date Noted  . A-fib ([place]) 02/08/2019  . Atrial fibrillation with rapid ventricular response ([place]) 02/07/2019  . Atrial fibrillation, new onset ([place]) 02/03/2019  . Bilateral carpal tunnel syndrome 07/30/2016  . OSA (obstructive sleep apnea) 03/07/2016  . MDD (major depressive disorder), recurrent episode, mild ([place]) 11/30/2015  . Osteoarthritis of both knees 11/30/2015  . BPPV (benign paroxysmal positional vertigo) 08/28/2015  . Essential hypertension 11/09/2014  . Hyperglycemia 11/09/2014  . GAD (generalized anxiety disorder) 11/09/2014  . Acanthosis  nigricans 11/09/2014  . Morbid obesity ([place]) 01/06/2012    Social History   Tobacco Use  . Smoking status: Never Smoker  . Smokeless tobacco: Never Used  Substance Use Topics  . Alcohol use: No    Alcohol/week: 0.0 standard drinks    Comment: rare     Current Outpatient Medications:  .  acetaminophen (TYLENOL 8 HOUR ARTHRITIS PAIN) 650 MG CR tablet, Take 1 tablet (650 mg total) by mouth every 8 (eight) hours as needed for pain. (Patient taking differently: Take 1,300 mg by mouth every 8 (eight) hours as needed for pain. ), Disp: 90 tablet, Rfl: 0 .  albuterol (PROVENTIL HFA;VENTOLIN HFA) 108 (90 Base) MCG/ACT inhaler, TAKE 2 PUFFS BY MOUTH EVERY 6 HOURS AS NEEDED FOR WHEEZE OR SHORTNESS OF BREATH, Disp: 8.5 Inhaler, Rfl: 0 .  apixaban (ELIQUIS) 5 MG TABS tablet, Take 1 tablet (5 mg total) by mouth 2 (two) times daily., Disp: 180 tablet, Rfl: 2 .  azelastine (ASTELIN) 0.1 % nasal spray, PLACE 2 SPRAYS INTO BOTH NOSTRILS 2 (TWO) TIMES DAILY, Disp: 30 mL, Rfl: 1 .  cloNIDine (CATAPRES - DOSED IN MG/24 HR) 0.1 mg/24hr patch, PLACE 1 PATCH (0.1 MG TOTAL) ONTO THE SKIN ONCE A WEEK., Disp: 12 patch, Rfl: 1 .  famotidine (PEPCID) 20 MG tablet, Take 1 tablet (20 mg total) by mouth daily., Disp: 90 tablet, Rfl: 0 .  fexofenadine (ALLEGRA) 180 MG tablet, Take 180 mg by mouth daily., Disp: , Rfl:  .  methocarbamol (ROBAXIN) 500 MG tablet, Take 1 tablet (500 mg total) by mouth every 8 (eight) hours as needed for muscle spasms.,  Disp: 20 tablet, Rfl: 0 .  metoprolol tartrate (LOPRESSOR) 100 MG tablet, Take 1 tablet (100 mg total) by mouth 2 (two) times daily., Disp: 180 tablet, Rfl: 1 .  oxyCODONE (OXY IR/ROXICODONE) 5 MG immediate release tablet, Take 1 tablet (5 mg total) by mouth every 6 (six) hours as needed for moderate pain or breakthrough pain., Disp: 12 tablet, Rfl: 0 .  OZEMPIC, 1 MG/DOSE, 2 MG/1.5ML SOPN, INJECT 1 MG INTO THE SKIN ONCE A WEEK., Disp: 9 mL, Rfl: 1 .  triamcinolone (NASACORT)  55 MCG/ACT AERO nasal inhaler, Place 2 sprays into the nose daily., Disp: , Rfl:  .  triamcinolone cream (KENALOG) 0.1 %, APPLY TO AFFECTRED AREA TWICE A DAYT, Disp: 80 g, Rfl: 0 .  venlafaxine XR (EFFEXOR XR) 75 MG 24 hr capsule, Take 1 capsule (75 mg total) by mouth daily with breakfast. To be combined with 150 mg, Disp: 90 capsule, Rfl: 0 .  venlafaxine XR (EFFEXOR-XR) 150 MG 24 hr capsule, To be combined with 75 mg, Disp: 90 capsule, Rfl: 0 .  XIIDRA 5 % SOLN, Place 1 drop into both eyes 2 (two) times daily., Disp: , Rfl: 4  Allergies  Allergen Reactions  . Ace Inhibitors Cough  . Hctz [Hydrochlorothiazide] Rash    face    I personally reviewed active problem list, medication list, allergies, notes from last encounter, lab results with the patient/caregiver today.  ROS  Ten systems reviewed and is negative except as mentioned in HPI  Objective  Virtual encounter, vitals not obtained.  There is no height or weight on file to calculate BMI.  Nursing Note and Vital Signs reviewed.  Physical Exam  Pulmonary/Chest: Effort normal. No respiratory distress. Speaking in complete sentences Neurological: Pt is alert and oriented to person, place, and time. Speech is normal Psychiatric: Patient has a normal mood and affect. behavior is normal. Judgment and thought content normal.    No results found for this or any previous visit (from the past 72 hour(s)).  Assessment & Plan  1. Respiratory illness - Community spread for COVID-19 is quite high, her symptoms are mild, however she has several risk factors for poor outcome if infected, so we will order testing today.  Symptomatic management as below.  Work note to be provided - she is work from home, however is fatigued and would like 1-2 days to rest prior to returning to work. - MYCHART COVID-19 HOME MONITORING PROGRAM - MyChart Temperature FLOWSHEET; Future - Novel Coronavirus, NAA ([facility])  2. Cough - benzonatate (TESSALON  PERLES) 100 MG capsule; Take 1 capsule (100 mg total) by mouth 3 (three) times daily as needed.  Dispense: 30 capsule; Refill: 0 - albuterol (VENTOLIN HFA) 108 (90 Base) MCG/ACT inhaler; TAKE 2 PUFFS BY MOUTH EVERY 6 HOURS AS NEEDED FOR WHEEZE OR SHORTNESS OF BREATH  Dispense: 18 g; Refill: 2  3. Chest congestion - guaiFENesin (MUCINEX) 600 MG 12 hr tablet; Take 1 tablet (600 mg total) by mouth 2 (two) times daily.  Dispense: 20 tablet; Refill: 0  -Red flags and when to present for emergency care or RTC including fever >101.40F, chest pain, shortness of breath, new/worsening/un-resolving symptoms, reviewed with patient at time of visit. Follow up and care instructions discussed and provided in AVS. - I discussed the assessment and treatment plan with the patient. The patient was provided an opportunity to ask questions and all were answered. The patient agreed with the plan and demonstrated an understanding of the instructions.  - The  patient was advised to call back or seek an in-person evaluation if the symptoms worsen or if the condition fails to improve as anticipated.  I provided 13 minutes of non-face-to-face time during this encounter.  Doren CustardEmily E Curtisha Bendix, FNP

## 2019-03-31 ENCOUNTER — Encounter: Payer: Self-pay | Admitting: Family Medicine

## 2019-04-02 LAB — NOVEL CORONAVIRUS, NAA: SARS-CoV-2, NAA: DETECTED — AB

## 2019-04-05 ENCOUNTER — Telehealth: Payer: Self-pay | Admitting: Emergency Medicine

## 2019-04-05 NOTE — Telephone Encounter (Signed)
Patient called an stated she is having a severe headache. Feels like stabbing pain. Pain level now is a 9 last night it was a 10. What do you suggest

## 2019-04-05 NOTE — Telephone Encounter (Signed)
Due to her medical history and recent COVID-19 diagnosis, she will need to do a virtual visit to discuss her symptoms.  If worst headache of her life or any signs or symptoms of stroke (please review with her), needs to go to ER for evaluation.

## 2019-04-05 NOTE — Telephone Encounter (Signed)
Patient notified

## 2019-04-06 ENCOUNTER — Encounter: Payer: Self-pay | Admitting: Family Medicine

## 2019-04-06 ENCOUNTER — Other Ambulatory Visit: Payer: Self-pay

## 2019-04-06 ENCOUNTER — Other Ambulatory Visit: Payer: Self-pay | Admitting: Family Medicine

## 2019-04-06 ENCOUNTER — Ambulatory Visit (INDEPENDENT_AMBULATORY_CARE_PROVIDER_SITE_OTHER): Payer: BC Managed Care – PPO | Admitting: Family Medicine

## 2019-04-06 DIAGNOSIS — U071 COVID-19: Secondary | ICD-10-CM | POA: Diagnosis not present

## 2019-04-06 DIAGNOSIS — I10 Essential (primary) hypertension: Secondary | ICD-10-CM

## 2019-04-06 DIAGNOSIS — G44209 Tension-type headache, unspecified, not intractable: Secondary | ICD-10-CM

## 2019-04-06 MED ORDER — TIZANIDINE HCL 4 MG PO TABS: 2.0000 mg | ORAL_TABLET | Freq: Three times a day (TID) | ORAL | 0 refills | Status: DC | PRN

## 2019-04-06 NOTE — Progress Notes (Signed)
Name: Regina Christensen   MRN: [id number]    DOB: 04/06/1956   Date:04/06/2019       Progress Note  Subjective  Chief Complaint  Chief Complaint  Patient presents with  . COVID 19    Tested positive on 03/30/19  . Headache    I connected with  Regina Christensen  on 04/06/19 at  9:40 AM EST by a video enabled telemedicine application and verified that I am speaking with the correct person using two identifiers.  I discussed the limitations of evaluation and management by telemedicine and the availability of in person appointments. The patient expressed understanding and agreed to proceed. Staff also discussed with the patient that there may be a patient responsible charge related to this service. Patient Location: Home Provider Location: Office Additional Individuals present: none  HPI  Pt presents with concern for severe headache s/p COVID-19 infection.  She tested positive 03/30/2019, symptoms started 03/28/2019.   She describes the headache as stabbing, in the posterior left neck and radiating into her left side of head.  She notes that this felt similar to the pain she had when she had trigeminal neuralgia in the past.  - She has been taking tylenol round the clock and applied warm compress, which did help and headache is improving today.  - She is still having some fatigue, diarrhea, nasal congestion.  Denies chest pain, shortness of breath, light sensitivity, sound sensitivity, vision changes, weakness/numbness/tingling, slurred speech, confusion.  She does have Afib and is on blood thinner, we will avoid NSAID or steroids at this time. HR and O2 has been normal at home.   Patient Active Problem List   Diagnosis Date Noted  . A-fib (HCC) 02/08/2019  . Atrial fibrillation with rapid ventricular response (HCC) 02/07/2019  . Atrial fibrillation, new onset (HCC) 02/03/2019  . Bilateral carpal tunnel syndrome 07/30/2016  . OSA (obstructive sleep apnea) 03/07/2016  . MDD (major  depressive disorder), recurrent episode, mild (HCC) 11/30/2015  . Osteoarthritis of both knees 11/30/2015  . BPPV (benign paroxysmal positional vertigo) 08/28/2015  . Essential hypertension 11/09/2014  . Hyperglycemia 11/09/2014  . GAD (generalized anxiety disorder) 11/09/2014  . Acanthosis nigricans 11/09/2014  . Morbid obesity (HCC) 01/06/2012    Social History   Tobacco Use  . Smoking status: Never Smoker  . Smokeless tobacco: Never Used  Substance Use Topics  . Alcohol use: No    Alcohol/week: 0.0 standard drinks    Comment: rare     Current Outpatient Medications:  .  acetaminophen (TYLENOL 8 HOUR ARTHRITIS PAIN) 650 MG CR tablet, Take 1 tablet (650 mg total) by mouth every 8 (eight) hours as needed for pain. (Patient taking differently: Take 1,300 mg by mouth every 8 (eight) hours as needed for pain. ), Disp: 90 tablet, Rfl: 0 .  albuterol (VENTOLIN HFA) 108 (90 Base) MCG/ACT inhaler, TAKE 2 PUFFS BY MOUTH EVERY 6 HOURS AS NEEDED FOR WHEEZE OR SHORTNESS OF BREATH, Disp: 18 g, Rfl: 2 .  apixaban (ELIQUIS) 5 MG TABS tablet, Take 1 tablet (5 mg total) by mouth 2 (two) times daily., Disp: 180 tablet, Rfl: 2 .  azelastine (ASTELIN) 0.1 % nasal spray, PLACE 2 SPRAYS INTO BOTH NOSTRILS 2 (TWO) TIMES DAILY, Disp: 30 mL, Rfl: 1 .  benzonatate (TESSALON PERLES) 100 MG capsule, Take 1 capsule (100 mg total) by mouth 3 (three) times daily as needed., Disp: 30 capsule, Rfl: 0 .  cloNIDine (CATAPRES - DOSED IN MG/24 HR) 0.1 mg/24hr  patch, PLACE 1 PATCH (0.1 MG TOTAL) ONTO THE SKIN ONCE A WEEK., Disp: 12 patch, Rfl: 1 .  famotidine (PEPCID) 20 MG tablet, Take 1 tablet (20 mg total) by mouth daily., Disp: 90 tablet, Rfl: 0 .  fexofenadine (ALLEGRA) 180 MG tablet, Take 180 mg by mouth daily., Disp: , Rfl:  .  guaiFENesin (MUCINEX) 600 MG 12 hr tablet, Take 1 tablet (600 mg total) by mouth 2 (two) times daily., Disp: 20 tablet, Rfl: 0 .  methocarbamol (ROBAXIN) 500 MG tablet, Take 1 tablet (500  mg total) by mouth every 8 (eight) hours as needed for muscle spasms., Disp: 20 tablet, Rfl: 0 .  metoprolol tartrate (LOPRESSOR) 100 MG tablet, Take 1 tablet (100 mg total) by mouth 2 (two) times daily., Disp: 180 tablet, Rfl: 1 .  oxyCODONE (OXY IR/ROXICODONE) 5 MG immediate release tablet, Take 1 tablet (5 mg total) by mouth every 6 (six) hours as needed for moderate pain or breakthrough pain., Disp: 12 tablet, Rfl: 0 .  OZEMPIC, 1 MG/DOSE, 2 MG/1.5ML SOPN, INJECT 1 MG INTO THE SKIN ONCE A WEEK., Disp: 9 mL, Rfl: 1 .  triamcinolone (NASACORT) 55 MCG/ACT AERO nasal inhaler, Place 2 sprays into the nose daily., Disp: , Rfl:  .  triamcinolone cream (KENALOG) 0.1 %, APPLY TO AFFECTRED AREA TWICE A DAYT, Disp: 80 g, Rfl: 0 .  venlafaxine XR (EFFEXOR XR) 75 MG 24 hr capsule, Take 1 capsule (75 mg total) by mouth daily with breakfast. To be combined with 150 mg, Disp: 90 capsule, Rfl: 0 .  venlafaxine XR (EFFEXOR-XR) 150 MG 24 hr capsule, To be combined with 75 mg, Disp: 90 capsule, Rfl: 0 .  XIIDRA 5 % SOLN, Place 1 drop into both eyes 2 (two) times daily., Disp: , Rfl: 4  Allergies  Allergen Reactions  . Ace Inhibitors Cough  . Hctz [Hydrochlorothiazide] Rash    face    I personally reviewed active problem list, medication list, allergies, notes from last encounter, lab results with the patient/caregiver today.  ROS  Constitutional: Negative for fever or weight change.  Respiratory: Negative for cough and shortness of breath.   Cardiovascular: Negative for chest pain or palpitations.  Gastrointestinal: Negative for abdominal pain, no bowel changes.  Musculoskeletal: Negative for gait problem or joint swelling.  Skin: Negative for rash.  Neurological: Negative for dizziness or headache.  No other specific complaints in a complete review of systems (except as listed in HPI above).  Objective  Virtual encounter, vitals not obtained.  There is no height or weight on file to calculate BMI.   Nursing Note and Vital Signs reviewed.  Physical Exam  Constitutional: Patient appears well-developed and well-nourished. No distress.  HENT: Head: Normocephalic and atraumatic.  Neck: Normal range of motion. Pulmonary/Chest: Effort normal. No respiratory distress. Speaking in complete sentences Neurological: Pt is alert and oriented to person, place, and time. Coordination, speech and gait are normal.  No facial droop. Psychiatric: Patient has a normal mood and affect. behavior is normal. Judgment and thought content normal.  No results found for this or any previous visit (from the past 72 hour(s)).  Assessment & Plan  1. Acute non intractable tension-type headache - We will trial muscle relaxer for headache at this time.  If not improving she will call back in 1-2 days.  Reviewed stroke symptoms in detail with her and she is a very good historian with family support at home should she develop a neurologic deficit.   - tiZANidine (  ZANAFLEX) 4 MG tablet; Take 0.5-1 tablets (2-4 mg total) by mouth every 8 (eight) hours as needed (Headache).  Dispense: 20 tablet; Refill: 0  2. COVID-19 virus infection - Continue to monitor O2 and HR at home along with other symptoms.  She appears to be fairing quite well at this time.  Continue to stay hydrated.  Reassurance is provided that headache is a common complaint with COVID-19 infection.  -Red flags and when to present for emergency care or RTC including fever >101.71F, chest pain, shortness of breath, new/worsening/un-resolving symptoms, reviewed with patient at time of visit. Follow up and care instructions discussed and provided in AVS. - I discussed the assessment and treatment plan with the patient. The patient was provided an opportunity to ask questions and all were answered. The patient agreed with the plan and demonstrated an understanding of the instructions.  I provided 18 minutes of non-face-to-face time during this encounter.  Doren Custard, FNP

## 2019-04-11 ENCOUNTER — Other Ambulatory Visit: Payer: Self-pay | Admitting: Family Medicine

## 2019-04-11 DIAGNOSIS — K219 Gastro-esophageal reflux disease without esophagitis: Secondary | ICD-10-CM

## 2019-04-12 ENCOUNTER — Ambulatory Visit: Payer: BC Managed Care – PPO | Admitting: Family Medicine

## 2019-04-18 ENCOUNTER — Encounter: Payer: Self-pay | Admitting: Psychiatry

## 2019-04-18 ENCOUNTER — Ambulatory Visit (INDEPENDENT_AMBULATORY_CARE_PROVIDER_SITE_OTHER): Payer: BC Managed Care – PPO | Admitting: Psychiatry

## 2019-04-18 ENCOUNTER — Other Ambulatory Visit: Payer: Self-pay

## 2019-04-18 DIAGNOSIS — F411 Generalized anxiety disorder: Secondary | ICD-10-CM

## 2019-04-18 DIAGNOSIS — F3342 Major depressive disorder, recurrent, in full remission: Secondary | ICD-10-CM

## 2019-04-18 MED ORDER — VENLAFAXINE HCL ER 150 MG PO CP24: ORAL_CAPSULE | ORAL | 0 refills | Status: DC

## 2019-04-18 MED ORDER — VENLAFAXINE HCL ER 75 MG PO CP24: 75.0000 mg | ORAL_CAPSULE | Freq: Every day | ORAL | 0 refills | Status: DC

## 2019-04-18 NOTE — Progress Notes (Signed)
Virtual Visit via Video Note  I connected with Regina Christensen on 04/18/19 at  2:00 PM EST by a video enabled telemedicine application and verified that I am speaking with the correct person using two identifiers.   I discussed the limitations of evaluation and management by telemedicine and the availability of in person appointments. The patient expressed understanding and agreed to proceed.    I discussed the assessment and treatment plan with the patient. The patient was provided an opportunity to ask questions and all were answered. The patient agreed with the plan and demonstrated an understanding of the instructions.   The patient was advised to call back or seek an in-person evaluation if the symptoms worsen or if the condition fails to improve as anticipated.   BH MD OP Progress Note  04/18/2019 5:37 PM Regina Christensen  MRN:  [id number]  Chief Complaint:  Chief Complaint    Follow-up     HPI: [name] is a 63 year old African-American female, divorced, employed, lives in [place], has a history of GAD, MDD, hypertension, gastric bypass, hyperlipidemia, OSA, arthritis was evaluated by telemedicine today.  Patient today reports she was recently infected with COVID-19.  She reports she is currently making progress and has mild cough and no other symptoms at this point.  She reports she currently does not have any significant anxiety or depressive symptoms.  She is compliant on her venlafaxine.  She denies side effects.  Patient reports sleep is good.  Patient denies suicidality, homicidality or perceptual disturbances.  Patient reports she continues to be in psychotherapy sessions and has 1 more session available which she is planning to do next month.  Patient reports she is going to retire from her work at the end of January and she looks forward to that.  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  She denies any other concerns today. Visit Diagnosis:    ICD-10-CM   1. GAD (generalized anxiety disorder)  F41.1 venlafaxine XR (EFFEXOR XR) 75 MG 24 hr capsule    venlafaxine XR (EFFEXOR-XR) 150 MG 24 hr capsule  2. MDD (major depressive disorder), recurrent, in full remission (HCC)  F33.42 venlafaxine XR (EFFEXOR XR) 75 MG 24 hr capsule    Past Psychiatric History: I have reviewed past psychiatric history from my progress note on 01/27/2019.  Past trials of Prozac, Effexor.  Past Medical History:  Past Medical History:  Diagnosis Date  . Arthritis   . Depression   . Hypertension   . Morbid obesity with BMI of 45.0-49.9, adult (HCC)   . Obesity   . Sleep apnea     Past Surgical History:  Procedure Laterality Date  . ABDOMINAL HYSTERECTOMY    . BREAST EXCISIONAL BIOPSY Left   . ENDOMETRIAL ABLATION     x2  . GASTRIC BYPASS     sleeve    Family Psychiatric History: I have reviewed family psychiatric history from my progress note on 01/27/2019.  Family History:  Family History  Problem Relation Age of Onset  . Heart disease Father   . Cancer Brother   . Mental illness Neg Hx     Social History: Reviewed social history from my progress note on 01/27/2019. Social History   Socioeconomic History  . Marital status: Divorced    Spouse name: Not on file  . Number of children: 2  . Years of education: Not  on file  . Highest education level: Associate degree: occupational, Scientist, product/process developmenttechnical, or vocational program  Occupational History  . Occupation: patient Tax adviserfinancial services    Employer: [employer]  Tobacco Use  . Smoking status: Never Smoker  . Smokeless tobacco: Never Used  Substance and Sexual Activity  . Alcohol use: No    Alcohol/week: 0.0 standard drinks    Comment: rare  . Drug use: No  . Sexual activity: Never  Other Topics Concern  . 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 Difficulty of Paying Living Expenses: Not on file  Food Insecurity:   . Worried About Programme researcher, broadcasting/film/videounning Out of Food in the Last Year: Not on file  . Ran Out of Food in the Last Year: Not on file  Transportation Needs:   . Lack of Transportation (Medical): Not on file  . Lack of Transportation (Non-Medical): Not on file  Physical Activity:   . Days of Exercise per Week: Not on file  . Minutes of Exercise per Session: Not on file  Stress:   . Feeling of Stress : Not on file  Social Connections:   . Frequency of Communication with Friends and Family: Not on file  . Frequency of Social Gatherings with Friends and Family: Not on file  . Attends Religious Services: Not on file  . Active Member of Clubs or Organizations: Not on file  . Attends BankerClub or Organization Meetings: Not on file  . Marital Status: Not on file    Allergies:  Allergies  Allergen Reactions  . Ace Inhibitors Cough  . Hctz [Hydrochlorothiazide] Rash    face    Metabolic Disorder Labs: Lab Results  Component Value Date   HGBA1C 6.3 (H) 02/03/2019   MPG 134.11 02/03/2019   MPG 120 02/05/2018   No results found for: PROLACTIN Lab Results  Component Value Date   CHOL 107 02/08/2019   TRIG 139 02/08/2019   HDL 49 02/08/2019   CHOLHDL 2.2 02/08/2019   VLDL 28 02/08/2019   LDLCALC 30 02/08/2019   LDLCALC 36 02/03/2019   Lab Results  Component Value Date   TSH 1.288 02/03/2019   TSH 0.807 12/05/2014    Therapeutic Level Labs: No results found for: LITHIUM No results found for: VALPROATE No components found for:  CBMZ  Current Medications: Current Outpatient Medications  Medication Sig Dispense Refill  . acetaminophen (TYLENOL 8 HOUR ARTHRITIS PAIN) 650 MG CR tablet Take 1 tablet (650 mg total) by mouth every 8 (eight) hours as needed for pain.  (Patient taking differently: Take 1,300 mg by mouth every 8 (eight) hours as needed for pain. ) 90 tablet 0  . albuterol (VENTOLIN HFA) 108 (90 Base) MCG/ACT inhaler TAKE 2 PUFFS BY MOUTH EVERY 6 HOURS AS NEEDED FOR WHEEZE OR SHORTNESS OF BREATH 18 g 2  . apixaban (ELIQUIS) 5 MG TABS tablet Take 1 tablet (5 mg total) by mouth 2 (two) times daily. 180 tablet 2  . azelastine (ASTELIN) 0.1 % nasal spray PLACE 2 SPRAYS INTO BOTH NOSTRILS 2 (TWO) TIMES DAILY 30 mL 1  . benzonatate (TESSALON PERLES) 100 MG capsule Take 1 capsule (100 mg total) by mouth 3 (three) times daily as needed. 30 capsule 0  . cloNIDine (CATAPRES -  DOSED IN MG/24 HR) 0.1 mg/24hr patch PLACE 1 PATCH (0.1 MG TOTAL) ONTO THE SKIN ONCE A WEEK. 12 patch 1  . famotidine (PEPCID) 20 MG tablet Take 1 tablet (20 mg total) by mouth daily. 90 tablet 0  . fexofenadine (ALLEGRA) 180 MG tablet Take 180 mg by mouth daily.    Marland Kitchen guaiFENesin (MUCINEX) 600 MG 12 hr tablet Take 1 tablet (600 mg total) by mouth 2 (two) times daily. 20 tablet 0  . methocarbamol (ROBAXIN) 500 MG tablet Take 1 tablet (500 mg total) by mouth every 8 (eight) hours as needed for muscle spasms. 20 tablet 0  . metoprolol tartrate (LOPRESSOR) 100 MG tablet Take 1 tablet (100 mg total) by mouth 2 (two) times daily. 180 tablet 1  . oxyCODONE (OXY IR/ROXICODONE) 5 MG immediate release tablet Take 1 tablet (5 mg total) by mouth every 6 (six) hours as needed for moderate pain or breakthrough pain. 12 tablet 0  . OZEMPIC, 1 MG/DOSE, 2 MG/1.5ML SOPN INJECT 1 MG INTO THE SKIN ONCE A WEEK. 9 mL 1  . tiZANidine (ZANAFLEX) 4 MG tablet Take 0.5-1 tablets (2-4 mg total) by mouth every 8 (eight) hours as needed (Headache). 20 tablet 0  . triamcinolone (NASACORT) 55 MCG/ACT AERO nasal inhaler Place 2 sprays into the nose daily.    Marland Kitchen triamcinolone cream (KENALOG) 0.1 % APPLY TO AFFECTRED AREA TWICE A DAYT 80 g 0  . venlafaxine XR (EFFEXOR XR) 75 MG 24 hr capsule Take 1 capsule (75 mg total) by  mouth daily with breakfast. To be combined with 150 mg 90 capsule 0  . venlafaxine XR (EFFEXOR-XR) 150 MG 24 hr capsule To be combined with 75 mg 90 capsule 0  . XIIDRA 5 % SOLN Place 1 drop into both eyes 2 (two) times daily.  4   No current facility-administered medications for this visit.     Musculoskeletal: Strength & Muscle Tone: UTA Gait & Station: normal Patient leans: N/A  Psychiatric Specialty Exam: Review of Systems  Respiratory: Positive for cough.   Psychiatric/Behavioral: Negative for agitation, behavioral problems, confusion, decreased concentration, dysphoric mood, hallucinations, self-injury, sleep disturbance and suicidal ideas. The patient is not nervous/anxious and is not hyperactive.   All other systems reviewed and are negative.   There were no vitals taken for this visit.There is no height or weight on file to calculate BMI.  General Appearance: Casual  Eye Contact:  Fair  Speech:  Clear and Coherent  Volume:  Normal  Mood:  Euthymic  Affect:  Congruent  Thought Process:  Goal Directed and Descriptions of Associations: Intact  Orientation:  Full (Time, Place, and Person)  Thought Content: Logical   Suicidal Thoughts:  No  Homicidal Thoughts:  No  Memory:  Immediate;   Fair Recent;   Fair Remote;   Fair  Judgement:  Fair  Insight:  Fair  Psychomotor Activity:  Normal  Concentration:  Concentration: Fair and Attention Span: Fair  Recall:  [employer] of Knowledge: Fair  Language: Fair  Akathisia:  No  Handed:  Right  AIMS (if indicated): Denies tremors, rigidity  Assets:  Communication Skills Desire for Improvement Social Support  ADL's:  Intact  Cognition: WNL  Sleep:  Fair   Screenings: GAD-7     Office Visit from 01/27/2019 in [facility] Office Visit from 01/21/2019 in [facility] Office Visit from 06/21/2018 in [facility] Office Visit from 04/26/2018 in [facility]  Visit from 02/05/2018 in [facility]  Total GAD-7 Score  10  6  0  0  0    PHQ2-9     Office Visit from 04/06/2019 in [facility] Office Visit from 03/30/2019 in [facility] Office Visit from 03/02/2019 in [facility] Office Visit from 02/14/2019 in [facility] Office Visit from 02/03/2019 in [facility]  PHQ-2 Total Score  0  0  1  0  3  PHQ-9 Total Score  0  0  5  3  10        Assessment and Plan: Elsi is a 63 year old African-American female, divorced, employed, lives in [place], has a history of anxiety, depression, hypertension, hyperlipidemia, OSA, arthritis was evaluated by telemedicine today.  She is biologically predisposed given her health problems.  Patient with psychosocial stressors of work-related problems.  Patient however is currently making progress.  Plan as noted below.  Plan GAD-stable Venlafaxine XR 225 mg p.o. daily Continue psychotherapy sessions with her therapist-grounded for peace.  MDD-in remission Effexor 225 mg p.o. daily Melatonin 1 to 3 mg p.o. nightly as needed Continue CPAP.  Patient to continue to follow-up with her primary care provider for her COVID-19 symptoms.  Follow-up in clinic in 3 months or sooner if needed.  March 30 at 2 PM  I have spent atleast 15 minutes non face to face with patient today. More than 50 % of the time was spent for psychoeducation and supportive psychotherapy and care coordination. This note was generated in part or whole with voice recognition software. Voice recognition is usually quite accurate but there are transcription errors that can and very often do occur. I apologize for any typographical errors that were not detected and corrected.       Ursula Alert, MD 04/18/2019, 5:37 PM

## 2019-04-26 ENCOUNTER — Telehealth: Payer: Self-pay | Admitting: Emergency Medicine

## 2019-04-26 NOTE — Telephone Encounter (Signed)
They only gave her #27 tablets and that was over 2 weeks. Can she get a refill. Patient also stated she will check with Health Department to see if she can get vaccine there

## 2019-04-26 NOTE — Telephone Encounter (Signed)
Copied from CRM [id number]. Topic: General - Other >> Apr 26, 2019  9:49 AM Regina Christensen wrote: Reason for CRM: Pt would like another prescription for muscle relaxeers for her headaches. 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 She would also like to know if you would write her a letter stating that it is okay for her to get the covid shot or refer her somewhere to get it. She prefer not to get it at her job [facility]).

## 2019-04-27 NOTE — Telephone Encounter (Signed)
Patient notified

## 2019-05-02 ENCOUNTER — Telehealth: Payer: Self-pay | Admitting: Cardiology

## 2019-05-02 NOTE — Telephone Encounter (Signed)
   [facility] Pre-operative Risk Assessment    Request for surgical clearance:  1. What type of surgery is being performed? Rt TKA traditional knee - right total knee replacement   2. When is this surgery scheduled? TBD  3. What type of clearance is required (medical clearance vs. Pharmacy clearance to hold med vs. Both)? both  4. Are there any medications that need to be held prior to surgery and how long? Instructions on Eliquis    5. Practice name and name of physician performing surgery? [facility] - Dr Kurtis Bushman   6. What is your office phone number [phone]    7.   What is your office fax number [phone] or [phone]  8.   Anesthesia type (None, local, MAC, general) ? Spinal / regional    Regina Christensen 05/02/2019, 4:45 PM  _________________________________________________________________   (provider comments below)

## 2019-05-03 NOTE — Telephone Encounter (Signed)
Please comment on your recommendations for holding Eliquis for upcoming knee surgery. Thank you!

## 2019-05-03 NOTE — Telephone Encounter (Addendum)
   Primary Cardiologist: Debbe Odea, MD  Chart reviewed as part of pre-operative protocol coverage. She has upcoming right total knee replacement planned. Patient diagnosed with new onset atrial fibrillation in 01/2019. Echo showed LVEF on 02/08/2019 showed LVEF of 55-60% with mild LVH, mild mitral regurgitation, moderate tricuspid regurgitation, and biatrial enlargement.  She was started on Eliquis and Metoprolol. She was last seen by Dr. Azucena Cecil on 02/15/2019 at which time she was back in sinus rhythm and was doing well from a cardiac standpoint. I called patient today. She had COVID-19 in the beginning of 03/2019 but did OK from a cardiac standpoint throughout that. She notes occasional brief episodes of palpitations but no chest pain, shortness of breath, lightheadedness, dizziness, syncope, or CHF symptoms. Her activity is limited some by her knee pain at this time but she states she is able to walk up a flight of steps and around the mall without any chest pain or significant shortness of breath. Per Revised Cardiac Risk Index, considered low risk.  Given past medical history and time since last visit, based on ACC/AHA guidelines, Regina Christensen would be at acceptable risk for the planned procedure without further cardiovascular testing.   Per Pharmacy and office protocol, "patient can hold ELIQUIS for 3 days prior to procedure." Please resume as soon as able following surgery.   Given history of atrial fibrillation, recommend monitoring on telemetry during perioperative period.   I will route this recommendation to the requesting party via Epic fax function and remove from pre-op pool.  Please call with questions.  Corrin Parker, PA-C 05/03/2019, 2:14 PM

## 2019-05-03 NOTE — Telephone Encounter (Signed)
Patient with diagnosis of ATRIAL FIBRILLATION  on ELIQUIS for anticoagulation.    Procedure: TKA Date of procedure: TBD  CHADS2-VASc score of  2 (HTN, female)  Scr = 0.76  Per office protocol, patient can hold ELIQUIS for 3 days prior to procedure.

## 2019-05-13 ENCOUNTER — Other Ambulatory Visit: Payer: Self-pay

## 2019-05-13 ENCOUNTER — Ambulatory Visit (INDEPENDENT_AMBULATORY_CARE_PROVIDER_SITE_OTHER): Payer: BC Managed Care – PPO | Admitting: Family Medicine

## 2019-05-13 ENCOUNTER — Encounter: Payer: Self-pay | Admitting: Family Medicine

## 2019-05-13 VITALS — BP 160/108 | HR 92 | Temp 97.3°F | Resp 16 | Ht 68.0 in | Wt 285.9 lb

## 2019-05-13 DIAGNOSIS — I4891 Unspecified atrial fibrillation: Secondary | ICD-10-CM

## 2019-05-13 DIAGNOSIS — I10 Essential (primary) hypertension: Secondary | ICD-10-CM

## 2019-05-13 DIAGNOSIS — E8881 Metabolic syndrome: Secondary | ICD-10-CM

## 2019-05-13 DIAGNOSIS — G4733 Obstructive sleep apnea (adult) (pediatric): Secondary | ICD-10-CM

## 2019-05-13 DIAGNOSIS — K219 Gastro-esophageal reflux disease without esophagitis: Secondary | ICD-10-CM

## 2019-05-13 DIAGNOSIS — Z8616 Personal history of COVID-19: Secondary | ICD-10-CM

## 2019-05-13 DIAGNOSIS — R3915 Urgency of urination: Secondary | ICD-10-CM

## 2019-05-13 DIAGNOSIS — F411 Generalized anxiety disorder: Secondary | ICD-10-CM

## 2019-05-13 DIAGNOSIS — G500 Trigeminal neuralgia: Secondary | ICD-10-CM

## 2019-05-13 DIAGNOSIS — M170 Bilateral primary osteoarthritis of knee: Secondary | ICD-10-CM

## 2019-05-13 MED ORDER — PREGABALIN 50 MG PO CAPS: 50.0000 mg | ORAL_CAPSULE | Freq: Three times a day (TID) | ORAL | 0 refills | Status: DC

## 2019-05-13 MED ORDER — AMLODIPINE BESYLATE 2.5 MG PO TABS: 2.5000 mg | ORAL_TABLET | Freq: Every day | ORAL | 0 refills | Status: DC

## 2019-05-13 NOTE — Progress Notes (Signed)
Name: Regina Christensen   MRN: [id number]    DOB: [birth year]   Date:05/13/2019       Progress Note  Subjective  Chief Complaint  Chief Complaint  Patient presents with  . Medication Refill  . Hypertension  . Depression  . Atrial Fibrillation  . Insomnia  . Leucocytosis  . Flank Pain    Onset-1 week bilateral, sore and discomfort would like to be checked for a UTI    HPI   Afib: she states since she has been on higher dose of Metoprolol at 100 mg BID she states episodes are seldom, less than once a week and lasts at most a couple of minutes. She denies association with sob or chest pain. She has been taking Eliquis and denies bleeding. She states she has sob with activity but resolves with rest.   MDD/GAD: she is seeing Dr. Elna Breslow and also therapist. She went back to work on October 28th, 2020. Work is still very stressful but she has been coping better since she is near her retirement, she has 6 more days to go. She is compliant with medication and is taking Effexor 225 mg daily   Insomnia: she states Dr. Elna Breslow , she is only on Melatonin , she has been sleeping better   HTN: she was not able to check bp at home,  bp today was very high, she is only on lopressor and clonidine patch. We will add norvasc low dose, she needs to return for nurse visit and bp check.   History of COVID infection: Dec 2020 , she states seems to be back to her baseline except for mild taste disturbance, food doesn't taste right sometimes.   Morbid obesity: she states difficulty exercising because of OA of right knee, and is going to have knee replacement surgery next month. She is trying to eat healthier.   Pre-op: bp must be better controlled prior to surgery, she is high risk because of recent onset afib, uncontrolled HTN, she has OSA and has morbid obesity. She states cardiologist did the cardiac clearance, advised to get bp at goal. She had surgery before and no complications for anesthesia , no false  teeth. She will have a spinal block instead of general anesthesia.    Trigeminal neuralgia. She used to take tegretol in the past and needs a refill, pain is shooting and had been gone for years but had some pain end of December, pain has improved but still happens intermittently and  she would like to have a refill of medication today , however it interacts with Eliquis and we will try Lyrica instead   Low back pain: going on for about one week, no dysuria, fever, chills, change in urine color or odor, no urinary frequency, but has some urgency   Patient Active Problem List   Diagnosis Date Noted  . A-fib (HCC) 02/08/2019  . Atrial fibrillation with rapid ventricular response (HCC) 02/07/2019  . Atrial fibrillation, new onset (HCC) 02/03/2019  . Bilateral carpal tunnel syndrome 07/30/2016  . OSA (obstructive sleep apnea) 03/07/2016  . MDD (major depressive disorder), recurrent episode, mild (HCC) 11/30/2015  . Osteoarthritis of both knees 11/30/2015  . BPPV (benign paroxysmal positional vertigo) 08/28/2015  . Essential hypertension 11/09/2014  . Hyperglycemia 11/09/2014  . GAD (generalized anxiety disorder) 11/09/2014  . Acanthosis nigricans 11/09/2014  . Morbid obesity (HCC) 01/06/2012    Past Surgical History:  Procedure Laterality Date  . ABDOMINAL HYSTERECTOMY    . BREAST EXCISIONAL BIOPSY Left   .  ENDOMETRIAL ABLATION     x2  . GASTRIC BYPASS     sleeve    Family History  Problem Relation Age of Onset  . Heart disease Father   . Cancer Brother   . Mental illness Neg Hx       Current Outpatient Medications:  .  acetaminophen (TYLENOL 8 HOUR ARTHRITIS PAIN) 650 MG CR tablet, Take 1 tablet (650 mg total) by mouth every 8 (eight) hours as needed for pain. (Patient taking differently: Take 1,300 mg by mouth every 8 (eight) hours as needed for pain. ), Disp: 90 tablet, Rfl: 0 .  apixaban (ELIQUIS) 5 MG TABS tablet, Take 1 tablet (5 mg total) by mouth 2 (two) times daily.,  Disp: 180 tablet, Rfl: 2 .  azelastine (ASTELIN) 0.1 % nasal spray, PLACE 2 SPRAYS INTO BOTH NOSTRILS 2 (TWO) TIMES DAILY, Disp: 30 mL, Rfl: 1 .  cloNIDine (CATAPRES - DOSED IN MG/24 HR) 0.1 mg/24hr patch, PLACE 1 PATCH (0.1 MG TOTAL) ONTO THE SKIN ONCE A WEEK., Disp: 12 patch, Rfl: 1 .  famotidine (PEPCID) 20 MG tablet, Take 1 tablet (20 mg total) by mouth daily., Disp: 90 tablet, Rfl: 0 .  fexofenadine (ALLEGRA) 180 MG tablet, Take 180 mg by mouth daily., Disp: , Rfl:  .  metoprolol tartrate (LOPRESSOR) 100 MG tablet, Take 1 tablet (100 mg total) by mouth 2 (two) times daily., Disp: 180 tablet, Rfl: 1 .  OZEMPIC, 1 MG/DOSE, 2 MG/1.5ML SOPN, INJECT 1 MG INTO THE SKIN ONCE A WEEK., Disp: 9 mL, Rfl: 1 .  tiZANidine (ZANAFLEX) 4 MG tablet, Take 0.5-1 tablets (2-4 mg total) by mouth every 8 (eight) hours as needed (Headache)., Disp: 20 tablet, Rfl: 0 .  triamcinolone cream (KENALOG) 0.1 %, APPLY TO AFFECTRED AREA TWICE A DAYT, Disp: 80 g, Rfl: 0 .  venlafaxine XR (EFFEXOR XR) 75 MG 24 hr capsule, Take 1 capsule (75 mg total) by mouth daily with breakfast. To be combined with 150 mg, Disp: 90 capsule, Rfl: 0 .  venlafaxine XR (EFFEXOR-XR) 150 MG 24 hr capsule, To be combined with 75 mg, Disp: 90 capsule, Rfl: 0 .  XIIDRA 5 % SOLN, Place 1 drop into both eyes 2 (two) times daily., Disp: , Rfl: 4  Allergies  Allergen Reactions  . Ace Inhibitors Cough  . Hctz [Hydrochlorothiazide] Rash    face    I personally reviewed active problem list, medication list, allergies, family history, social history with the patient/caregiver today.   ROS  Constitutional: Negative for fever or weight change.  Respiratory: Negative for cough and shortness of breath.   Cardiovascular: Negative for chest pain or palpitations.  Gastrointestinal: Negative for abdominal pain, no bowel changes.  Musculoskeletal: Negative for gait problem or joint swelling.  Skin: Negative for rash.  Neurological: Negative for dizziness  or headache.  No other specific complaints in a complete review of systems (except as listed in HPI above).  Objective  Vitals:   05/13/19 1358 05/13/19 1400  BP:  (!) 160/108  Pulse: 92   Resp: 16   Temp: (!) 97.3 F (36.3 C)   TempSrc: Temporal   SpO2: 98%   Weight: 285 lb 14.4 oz (129.7 kg)   Height: 5\' 8"  (1.727 m)     Body mass index is 43.47 kg/m.  Physical Exam  Constitutional: Patient appears well-developed and well-nourished. Obese No distress.  HEENT: head atraumatic, normocephalic, pupils equal and reactive to light Cardiovascular: Normal rate, regular rhythm and normal heart sounds.  No murmur heard. No BLE edema. Pulmonary/Chest: Effort normal and breath sounds normal. No respiratory distress. Abdominal: Soft.  There is no tenderness. Muscular Skeletal: crepitus with extension of both knees  Psychiatric: Patient has a normal mood and affect. behavior is normal. Judgment and thought content normal.  Recent Results (from the past 2160 hour(s))  CBC with Differential/Platelet     Status: None   Collection Time: 03/07/19 12:00 AM  Result Value Ref Range   WBC 9.0 3.8 - 10.8 Thousand/uL   RBC 4.75 3.80 - 5.10 Million/uL   Hemoglobin 13.7 11.7 - 15.5 g/dL   HCT 43.1 54.0 - 08.6 %   MCV 87.6 80.0 - 100.0 fL   MCH 28.8 27.0 - 33.0 pg   MCHC 32.9 32.0 - 36.0 g/dL   RDW 76.1 95.0 - 93.2 %   Platelets 329 140 - 400 Thousand/uL   MPV 11.3 7.5 - 12.5 fL   Neutro Abs 6,390 1,500 - 7,800 cells/uL   Lymphs Abs 1,773 850 - 3,900 cells/uL   Absolute Monocytes 621 200 - 950 cells/uL   Eosinophils Absolute 144 15 - 500 cells/uL   Basophils Absolute 72 0 - 200 cells/uL   Neutrophils Relative % 71 %   Total Lymphocyte 19.7 %   Monocytes Relative 6.9 %   Eosinophils Relative 1.6 %   Basophils Relative 0.8 %  Novel Coronavirus, NAA ([facility])     Status: Abnormal   Collection Time: 03/30/19  1:16 PM   Specimen: Nasopharyngeal(NP) swabs in vial transport medium    NASOPHARYNGE  TESTING  Result Value Ref Range   SARS-CoV-2, NAA Detected (A) Not Detected    Comment: This nucleic acid amplification test was developed and its performance characteristics determined by [employer]. Nucleic acid amplification tests include PCR and TMA. This test has not been FDA cleared or approved. This test has been authorized by FDA under an Emergency Use Authorization (EUA). This test is only authorized for the duration of time the declaration that circumstances exist justifying the authorization of the emergency use of in vitro diagnostic tests for detection of SARS-CoV-2 virus and/or diagnosis of COVID-19 infection under section 564(b)(1) of the Act, 21 U.S.C. 671IWP-8(K) (1), unless the authorization is terminated or revoked sooner. When diagnostic testing is negative, the possibility of a false negative result should be considered in the context of a patient's recent exposures and the presence of clinical signs and symptoms consistent with COVID-19. An individual without symptoms of COVID-19 and who is not shedding SARS-CoV-2 virus would  expect to have a negative (not detected) result in this assay.       PHQ2/9: Depression screen [facility] 2/9 05/13/2019 04/06/2019 03/30/2019 03/02/2019 02/14/2019  Decreased Interest 0 0 0 0 0  Down, Depressed, Hopeless 0 0 0 1 0  PHQ - 2 Score 0 0 0 1 0  Altered sleeping 0 0 0 1 2  Tired, decreased energy 1 0 0 1 1  Change in appetite 0 0 0 1 0  Feeling bad or failure about yourself  0 0 0 0 0  Trouble concentrating 0 0 0 1 0  Moving slowly or fidgety/restless 0 0 0 0 0  Suicidal thoughts 0 0 0 0 0  PHQ-9 Score 1 0 0 5 3  Difficult doing work/chores Not difficult at all Not difficult at all Not difficult at all Very difficult Not difficult at all  Some encounter information is confidential and restricted. Go to Review Flowsheets activity to see all data.  Some recent data might be hidden    phq 9 is  negative   Fall Risk: Fall Risk  05/13/2019 04/06/2019 03/30/2019 03/02/2019 02/14/2019  Falls in the past year? 0 0 0 0 0  Number falls in past yr: 0 0 0 0 0  Injury with Fall? 0 0 0 0 0  Risk for fall due to : - - - - -  Follow up - - Falls evaluation completed - -    Functional Status Survey: Is the patient deaf or have difficulty hearing?: No Does the patient have difficulty seeing, even when wearing glasses/contacts?: No Does the patient have difficulty concentrating, remembering, or making decisions?: No Does the patient have difficulty walking or climbing stairs?: No Does the patient have difficulty dressing or bathing?: No Does the patient have difficulty doing errands alone such as visiting a doctor's office or shopping?: No   Assessment & Plan  1. Atrial fibrillation with rapid ventricular response ([place])    2. GAD (generalized anxiety disorder)   3. History of COVID-19  She will have pre op at [facility]  4. OSA (obstructive sleep apnea)   5. Metabolic syndrome  Reviewed last labs on Ozempic   6. Morbid obesity ([place])  Discussed with the patient the risk posed by an increased BMI. Discussed importance of portion control, calorie counting and at least 150 minutes of physical activity weekly. Avoid sweet beverages and drink more water. Eat at least 6 servings of fruit and vegetables daily   7. GERD without esophagitis   8. Primary osteoarthritis of both knees  Going to have knee replacement   9. Essential hypertension  Return in one week for bp check  - amLODipine (NORVASC) 2.5 MG tablet; Take 1 tablet (2.5 mg total) by mouth daily.  Dispense: 30 tablet; Refill: 0  10. Trigeminal neuralgia of left side of face  - pregabalin (LYRICA) 50 MG capsule; Take 1 capsule (50 mg total) by mouth 3 (three) times daily.  Dispense: 90 capsule; Refill: 0  11. Urinary urgency  - Urine Culture

## 2019-05-14 LAB — URINE CULTURE
MICRO NUMBER:: 10072167
SPECIMEN QUALITY:: ADEQUATE

## 2019-05-15 ENCOUNTER — Other Ambulatory Visit: Payer: Self-pay | Admitting: Gastroenterology

## 2019-05-20 ENCOUNTER — Other Ambulatory Visit: Payer: Self-pay

## 2019-05-20 ENCOUNTER — Ambulatory Visit: Payer: BC Managed Care – PPO

## 2019-05-20 VITALS — BP 134/82

## 2019-05-20 DIAGNOSIS — I10 Essential (primary) hypertension: Secondary | ICD-10-CM

## 2019-05-20 NOTE — Progress Notes (Signed)
Patient is here for a blood pressure check. Patient denies chest pain, palpitations, shortness of breath or visual disturbances. At previous visit blood pressure was 160 108. Today during nurse visit first check blood pressure was 134/82 . She does take blood pressure medications.

## 2019-06-05 ENCOUNTER — Other Ambulatory Visit: Payer: Self-pay | Admitting: Family Medicine

## 2019-06-05 DIAGNOSIS — I10 Essential (primary) hypertension: Secondary | ICD-10-CM

## 2019-06-05 NOTE — Telephone Encounter (Signed)
Requested Prescriptions  Pending Prescriptions Disp Refills  . amLODipine (NORVASC) 2.5 MG tablet [Pharmacy Med Name: AMLODIPINE BESYLATE 2.5 MG TAB] 30 tablet 0    Sig: TAKE 1 TABLET BY MOUTH EVERY DAY     Cardiovascular:  Calcium Channel Blockers Passed - 06/05/2019  2:32 PM      Passed - Last BP in normal range    BP Readings from Last 1 Encounters:  05/20/19 134/82         Passed - Valid encounter within last 6 months    Recent Outpatient Visits          3 weeks ago Atrial fibrillation with rapid ventricular response [facility])   [facility] [name], MD   2 months ago Acute non intractable tension-type headache   [facility] Doren Custard, FNP   2 months ago Respiratory illness   [facility], Gerome Apley, FNP   3 months ago Atrial fibrillation with rapid ventricular response [facility])   [facility] [name], MD   3 months ago Essential hypertension   [facility] [name], MD      Future Appointments            In 2 months Agbor-Etang, Arlys John, MD [facility], [facility]   In 2 months [name], MD [facility], [facility]

## 2019-06-06 DIAGNOSIS — Z96659 Presence of unspecified artificial knee joint: Secondary | ICD-10-CM | POA: Insufficient documentation

## 2019-06-20 HISTORY — PX: TOTAL KNEE ARTHROPLASTY: SHX125

## 2019-06-27 ENCOUNTER — Other Ambulatory Visit: Payer: Self-pay | Admitting: Family Medicine

## 2019-06-27 DIAGNOSIS — I10 Essential (primary) hypertension: Secondary | ICD-10-CM

## 2019-06-27 NOTE — Telephone Encounter (Signed)
Requested Prescriptions  Pending Prescriptions Disp Refills  . amLODipine (NORVASC) 2.5 MG tablet [Pharmacy Med Name: AMLODIPINE BESYLATE 2.5 MG TAB] 30 tablet 0    Sig: TAKE 1 TABLET BY MOUTH EVERY DAY     Cardiovascular:  Calcium Channel Blockers Passed - 06/27/2019  9:31 AM      Passed - Last BP in normal range    BP Readings from Last 1 Encounters:  05/20/19 134/82         Passed - Valid encounter within last 6 months    Recent Outpatient Visits          1 month ago Atrial fibrillation with rapid ventricular response [facility])   [facility] [name], MD   2 months ago Acute non intractable tension-type headache   [facility] Doren Custard, FNP   2 months ago Respiratory illness   [facility], Gerome Apley, FNP   3 months ago Atrial fibrillation with rapid ventricular response [facility])   [facility] [name], MD   4 months ago Essential hypertension   [facility] [name], MD      Future Appointments            In 1 month Agbor-Etang, Arlys John, MD [facility], [facility]   In 2 months [name], MD [facility], [facility]

## 2019-07-17 ENCOUNTER — Ambulatory Visit: Payer: BC Managed Care – PPO | Attending: Internal Medicine

## 2019-07-17 DIAGNOSIS — Z23 Encounter for immunization: Secondary | ICD-10-CM

## 2019-07-17 NOTE — Progress Notes (Signed)
   Covid-19 Vaccination Clinic  Name:  Regina Christensen    MRN: [id number] DOB: Aug 14, 1955  07/17/2019  [name] was observed post Covid-19 immunization for 15 minutes without incident. She was provided with Vaccine Information Sheet and instruction to access the V-Safe system.   [name] was instructed to call 911 with any severe reactions post vaccine: Marland Kitchen Difficulty breathing  . Swelling of face and throat  . A fast heartbeat  . A bad rash all over body  . Dizziness and weakness   Immunizations Administered    Name Date Dose VIS Date Route   Pfizer COVID-19 Vaccine 07/17/2019  9:50 AM 0.3 mL 04/01/2019 Intramuscular   Manufacturer: [employer]   Lot: [id number]   NDC: [address]-4

## 2019-07-19 ENCOUNTER — Ambulatory Visit (INDEPENDENT_AMBULATORY_CARE_PROVIDER_SITE_OTHER): Payer: BC Managed Care – PPO | Admitting: Psychiatry

## 2019-07-19 ENCOUNTER — Other Ambulatory Visit: Payer: Self-pay

## 2019-07-19 ENCOUNTER — Encounter: Payer: Self-pay | Admitting: Psychiatry

## 2019-07-19 DIAGNOSIS — R400 Somnolence: Secondary | ICD-10-CM | POA: Insufficient documentation

## 2019-07-19 DIAGNOSIS — F3342 Major depressive disorder, recurrent, in full remission: Secondary | ICD-10-CM | POA: Diagnosis not present

## 2019-07-19 DIAGNOSIS — Z90710 Acquired absence of both cervix and uterus: Secondary | ICD-10-CM | POA: Insufficient documentation

## 2019-07-19 DIAGNOSIS — D72829 Elevated white blood cell count, unspecified: Secondary | ICD-10-CM | POA: Insufficient documentation

## 2019-07-19 DIAGNOSIS — G4700 Insomnia, unspecified: Secondary | ICD-10-CM | POA: Diagnosis not present

## 2019-07-19 DIAGNOSIS — F5105 Insomnia due to other mental disorder: Secondary | ICD-10-CM | POA: Insufficient documentation

## 2019-07-19 DIAGNOSIS — F419 Anxiety disorder, unspecified: Secondary | ICD-10-CM | POA: Insufficient documentation

## 2019-07-19 DIAGNOSIS — F411 Generalized anxiety disorder: Secondary | ICD-10-CM

## 2019-07-19 MED ORDER — VENLAFAXINE HCL ER 75 MG PO CP24: 75.0000 mg | ORAL_CAPSULE | Freq: Every day | ORAL | 0 refills | Status: DC

## 2019-07-19 MED ORDER — VENLAFAXINE HCL ER 150 MG PO CP24: ORAL_CAPSULE | ORAL | 0 refills | Status: DC

## 2019-07-19 NOTE — Progress Notes (Signed)
Provider Location : [facility] Patient Location : Home Virtual Visit via Video Note  I connected with Regina Christensen on 07/19/19 at  2:00 PM EDT by a video enabled telemedicine application and verified that I am speaking with the correct person using two identifiers.   I discussed the limitations of evaluation and management by telemedicine and the availability of in person appointments. The patient expressed understanding and agreed to proceed.   I discussed the assessment and treatment plan with the patient. The patient was provided an opportunity to ask questions and all were answered. The patient agreed with the plan and demonstrated an understanding of the instructions.   The patient was advised to call back or seek an in-person evaluation if the symptoms worsen or if the condition fails to improve as anticipated.  BH MD OP Progress Note  07/19/2019 6:02 PM Regina Christensen  MRN:  [id number]  Chief Complaint:  Chief Complaint    Follow-up     HPI: Regina Christensen is a 63 year old African-American female, divorced, retired, lives in [place], has a history of GAD, MDD, hypertension, gastric bypass, hyperlipidemia, OSA, arthritis was evaluated by telemedicine today.  Patient today reports she is currently doing well.  She denies any significant anxiety or depressive symptoms.  She is compliant on the medication as prescribed.  She does report sleep problems.  She however reports she does not want to be on any sleep medication at this time other than melatonin.  She currently takes melatonin 1 mg and up to 3 mg.  She reports she is willing to increase the dosage of the melatonin.  Patient denies any suicidality, homicidality or perceptual disturbances.  She reports she is going to start a part-time job soon.  She looks forward to that.  Visit Diagnosis:    ICD-10-CM   1. GAD (generalized anxiety disorder)  F41.1 venlafaxine XR (EFFEXOR XR) 75 MG 24 hr capsule    venlafaxine XR  (EFFEXOR-XR) 150 MG 24 hr capsule  2. MDD (major depressive disorder), recurrent, in full remission (HCC)  F33.42 venlafaxine XR (EFFEXOR XR) 75 MG 24 hr capsule  3. Insomnia, unspecified type  G47.00     Past Psychiatric History: I have reviewed past psychiatric history from my progress note on 01/27/2019.  Past trials of Prozac, Effexor  Past Medical History:  Past Medical History:  Diagnosis Date  . Arthritis   . Depression   . Hypertension   . Morbid obesity with BMI of 45.0-49.9, adult (HCC)   . Obesity   . Sleep apnea     Past Surgical History:  Procedure Laterality Date  . ABDOMINAL HYSTERECTOMY    . BREAST EXCISIONAL BIOPSY Left   . ENDOMETRIAL ABLATION     x2  . GASTRIC BYPASS     sleeve    Family Psychiatric History: I have reviewed family psychiatric history from my progress note on 01/27/2019  Family History:  Family History  Problem Relation Age of Onset  . Heart disease Father   . Cancer Brother   . Mental illness Neg Hx     Social History: I have reviewed social history from my progress note on 01/27/2019 Social History   Socioeconomic History  . Marital status: Divorced    Spouse name: Not on file  . Number of children: 2  . Years of education: Not on file  . Highest education level: Associate degree: occupational, Scientist, product/process development, or vocational program  Occupational History  . Occupation: patient Tax adviser:  [employer]  Tobacco Use  . Smoking status: Never Smoker  . Smokeless tobacco: Never Used  Substance and Sexual Activity  . Alcohol use: No    Alcohol/week: 0.0 standard drinks    Comment: rare  . Drug use: No  . Sexual activity: Never  Other Topics Concern  . 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 Difficulty of Paying  Living Expenses:   Food Insecurity:   . Worried About Charity fundraiser in the Last Year:   . Arboriculturist in the Last Year:   Transportation Needs:   . Film/video editor (Medical):   Marland Kitchen Lack of Transportation (Non-Medical):   Physical Activity:   . Days of Exercise per Week:   . Minutes of Exercise per Session:   Stress:   . Feeling of Stress :   Social Connections:   . Frequency of Communication with Friends and Family:   . Frequency of Social Gatherings with Friends and Family:   . Attends Religious Services:   . Active Member of Clubs or Organizations:   . Attends Archivist Meetings:   Marland Kitchen Marital Status:     Allergies:  Allergies  Allergen Reactions  . Ace Inhibitors Cough  . Hctz [Hydrochlorothiazide] Rash    face    Metabolic Disorder Labs: Lab Results  Component Value Date   HGBA1C 6.3 (H) 02/03/2019   MPG 134.11 02/03/2019   MPG 120 02/05/2018   No results found for: PROLACTIN Lab Results  Component Value Date   CHOL 107 02/08/2019   TRIG 139 02/08/2019   HDL 49 02/08/2019   CHOLHDL 2.2 02/08/2019   VLDL 28 02/08/2019   LDLCALC 30 02/08/2019   LDLCALC 36 02/03/2019   Lab Results  Component Value Date   TSH 1.288 02/03/2019   TSH 0.807 12/05/2014    Therapeutic Level Labs: No results found for: LITHIUM No results found for: VALPROATE No components found for:  CBMZ  Current Medications: Current Outpatient Medications  Medication Sig Dispense Refill  . acetaminophen (TYLENOL 8 HOUR ARTHRITIS PAIN) 650 MG CR tablet Take 1 tablet (650 mg total) by mouth every 8 (eight) hours as needed for pain. (Patient taking differently: Take 1,300 mg by mouth every 8 (eight) hours as needed for pain. ) 90 tablet 0  . amLODipine (NORVASC) 2.5 MG tablet TAKE 1 TABLET BY MOUTH EVERY DAY 90 tablet 0  . apixaban (ELIQUIS) 5 MG TABS tablet Take 1 tablet (5 mg total) by mouth 2 (two) times daily. 180 tablet 2  . azelastine (ASTELIN) 0.1 % nasal spray  PLACE 2 SPRAYS INTO BOTH NOSTRILS 2 (TWO) TIMES DAILY 30 mL 1  . cephALEXin (KEFLEX) 500 MG capsule cephalexin 500 mg capsule    . cephALEXin (KEFLEX) 500 MG capsule Take 500 mg by mouth every 6 (six) hours.    . cloNIDine (CATAPRES - DOSED IN MG/24 HR) 0.1 mg/24hr patch PLACE 1 PATCH (0.1 MG TOTAL) ONTO THE SKIN ONCE A WEEK. 12 patch 1  . cyclobenzaprine (FLEXERIL) 10 MG tablet Take 10 mg by mouth 3 (three) times daily.    . cyclobenzaprine (FLEXERIL) 10 MG tablet cyclobenzaprine 10 mg tablet    . famotidine (PEPCID) 20 MG tablet  TAKE 1 TABLET BY MOUTH EVERY DAY 90 tablet 0  . fexofenadine (ALLEGRA) 180 MG tablet Take 180 mg by mouth daily.    Marland Kitchen GOODSENSE STIMULANT LAXATIVE 8.6-50 MG tablet 2 EACH PO QAM    . HYDROcodone-acetaminophen (NORCO/VICODIN) 5-325 MG tablet Take 1 tablet by mouth.    Marland Kitchen HYDROcodone-acetaminophen (NORCO/VICODIN) 5-325 MG tablet hydrocodone 5 mg-acetaminophen 325 mg tablet    . methocarbamol (ROBAXIN) 500 MG tablet Take 500 mg by mouth 3 (three) times daily.    . metoprolol tartrate (LOPRESSOR) 100 MG tablet Take 1 tablet (100 mg total) by mouth 2 (two) times daily. 180 tablet 1  . oxyCODONE (OXY IR/ROXICODONE) 5 MG immediate release tablet SMARTSIG:5 Milligram(s) By Mouth Every 4 Hours PRN    . OZEMPIC, 1 MG/DOSE, 2 MG/1.5ML SOPN INJECT 1 MG INTO THE SKIN ONCE A WEEK. 9 mL 1  . pregabalin (LYRICA) 50 MG capsule Take 1 capsule (50 mg total) by mouth 3 (three) times daily. 90 capsule 0  . tiZANidine (ZANAFLEX) 4 MG tablet Take 0.5-1 tablets (2-4 mg total) by mouth every 8 (eight) hours as needed (Headache). 20 tablet 0  . traMADol (ULTRAM) 50 MG tablet Take 50 mg by mouth every 8 (eight) hours as needed.    . triamcinolone cream (KENALOG) 0.1 % APPLY TO AFFECTRED AREA TWICE A DAYT 80 g 0  . venlafaxine XR (EFFEXOR XR) 75 MG 24 hr capsule Take 1 capsule (75 mg total) by mouth daily with breakfast. To be combined with 150 mg 90 capsule 0  . venlafaxine XR (EFFEXOR-XR) 150 MG  24 hr capsule To be combined with 75 mg 90 capsule 0  . XIIDRA 5 % SOLN Place 1 drop into both eyes 2 (two) times daily.  4   No current facility-administered medications for this visit.     Musculoskeletal: Strength & Muscle Tone: UTA Gait & Station: normal Patient leans: N/A  Psychiatric Specialty Exam: Review of Systems  Psychiatric/Behavioral: Positive for sleep disturbance. Negative for agitation, confusion, decreased concentration, dysphoric mood, hallucinations, self-injury and suicidal ideas. The patient is not nervous/anxious and is not hyperactive.   All other systems reviewed and are negative.   There were no vitals taken for this visit.There is no height or weight on file to calculate BMI.  General Appearance: Casual  Eye Contact:  Fair  Speech:  Normal Rate  Volume:  Normal  Mood:  Euthymic  Affect:  Congruent  Thought Process:  Goal Directed and Descriptions of Associations: Intact  Orientation:  Full (Time, Place, and Person)  Thought Content: Logical   Suicidal Thoughts:  No  Homicidal Thoughts:  No  Memory:  Immediate;   Fair Recent;   Fair Remote;   Fair  Judgement:  Fair  Insight:  Fair  Psychomotor Activity:  Normal  Concentration:  Concentration: Fair and Attention Span: Fair  Recall:  [employer] of Knowledge: Fair  Language: Fair  Akathisia:  No  Handed:  Right  AIMS (if indicated): UTA  Assets:  Communication Skills Desire for Improvement Financial Resources/Insurance Housing Social Support  ADL's:  Intact  Cognition: WNL  Sleep:  Poor   Screenings: GAD-7     [facility] Visit from 01/27/2019 in [facility] Office Visit from 01/21/2019 in [facility] Office Visit from 06/21/2018 in [facility] Office Visit from 04/26/2018 in [facility] Office Visit from 02/05/2018 in [facility]  Total GAD-7 Score  10  6  0  0  0    PHQ2-9     Office Visit  from 05/13/2019 in [facility] Office Visit from 04/06/2019 in [facility] Office Visit from 03/30/2019 in [facility] Office Visit from 03/02/2019 in [facility] Office Visit from 02/14/2019 in [facility]  PHQ-2 Total Score  0  0  0  1  0  PHQ-9 Total Score  1  0  0  5  3       Assessment and Plan: Regina Christensen is a 64 year old African-American female, divorced, retired lives in [place], has a history of anxiety, depression, hypertension, hyperlipidemia, OSA, arthritis was evaluated by telemedicine today.  Patient is biologically predisposed given her health problems.  Patient with psychosocial stressors of the current pandemic currently reports she is struggling with sleep issues.  Plan as noted below.  Plan GAD-stable Venlafaxine extended release 225 mg p.o. daily Continue psychotherapy sessions with her therapist as needed  MDD in remission Effexor 225 mg p.o. daily  Insomnia unspecified-unstable Increase melatonin to 5 mg p.o. nightly as needed Continue CPAP Patient advised to follow good sleep hygiene.  She declines other sleep medications at this time.  Patient advised to reach out to writer if she continues to struggle with sleep.  Follow-up in clinic in 2 to 3 months or sooner if needed.  I have spent atleast 20 minutes non face to face with patient today. More than 50 % of the time was spent for preparing to see the patient ( e.g., review of test, records ),ordering medications and test ,psychoeducation and supportive psychotherapy and care coordination,as well as documenting clinical information in electronic health record. This note was generated in part or whole with voice recognition software. Voice recognition is usually quite accurate but there are transcription errors that can and very often do occur. I apologize for any typographical errors that were not detected and  corrected.       Jomarie Longs, MD 07/19/2019, 6:02 PM

## 2019-07-31 ENCOUNTER — Other Ambulatory Visit: Payer: Self-pay | Admitting: Family Medicine

## 2019-07-31 DIAGNOSIS — I10 Essential (primary) hypertension: Secondary | ICD-10-CM

## 2019-07-31 NOTE — Telephone Encounter (Signed)
Requested Prescriptions  Pending Prescriptions Disp Refills  . cloNIDine (CATAPRES - DOSED IN MG/24 HR) 0.1 mg/24hr patch [Pharmacy Med Name: CLONIDINE 0.1 MG/DAY PATCH] 12 patch 1    Sig: PLACE 1 PATCH (0.1 MG TOTAL) ONTO THE SKIN ONCE A WEEK.     Cardiovascular:  Alpha-2 Agonists Passed - 07/31/2019  9:38 AM      Passed - Last BP in normal range    BP Readings from Last 1 Encounters:  05/20/19 134/82         Passed - Last Heart Rate in normal range    Pulse Readings from Last 1 Encounters:  05/13/19 92         Passed - Valid encounter within last 6 months    Recent Outpatient Visits          2 months ago Atrial fibrillation with rapid ventricular response [facility])   [facility] [name], MD   3 months ago Acute non intractable tension-type headache   [facility] Doren Custard, FNP   4 months ago Respiratory illness   [facility], [place] Burton E, FNP   5 months ago Atrial fibrillation with rapid ventricular response [facility])   [facility] [name], MD   5 months ago Essential hypertension   [facility] [name], MD      Future Appointments            In 2 weeks Agbor-Etang, Arlys John, MD [facility], [facility]   In 3 weeks [name], MD [facility], [facility]

## 2019-08-09 ENCOUNTER — Other Ambulatory Visit: Payer: Self-pay

## 2019-08-09 ENCOUNTER — Ambulatory Visit (INDEPENDENT_AMBULATORY_CARE_PROVIDER_SITE_OTHER): Payer: BC Managed Care – PPO

## 2019-08-09 DIAGNOSIS — I4891 Unspecified atrial fibrillation: Secondary | ICD-10-CM

## 2019-08-10 ENCOUNTER — Ambulatory Visit: Payer: BC Managed Care – PPO | Attending: Internal Medicine

## 2019-08-10 DIAGNOSIS — Z23 Encounter for immunization: Secondary | ICD-10-CM

## 2019-08-10 NOTE — Progress Notes (Signed)
   Covid-19 Vaccination Clinic  Name:  Regina Christensen    MRN: [id number] DOB: [birth year]  08/10/2019  Ms. [name] was observed post Covid-19 immunization for 15 minutes without incident. She was provided with Vaccine Information Sheet and instruction to access the V-Safe system.   Ms. [name] was instructed to call 911 with any severe reactions post vaccine: Marland Kitchen Difficulty breathing  . Swelling of face and throat  . A fast heartbeat  . A bad rash all over body  . Dizziness and weakness   Immunizations Administered    Name Date Dose VIS Date Route   Pfizer COVID-19 Vaccine 08/10/2019  9:32 AM 0.3 mL 06/15/2018 Intramuscular   Manufacturer: [employer], [employer]   Lot: [id number]   NDC: [address]-2

## 2019-08-12 ENCOUNTER — Other Ambulatory Visit: Payer: Self-pay | Admitting: Gastroenterology

## 2019-08-16 ENCOUNTER — Other Ambulatory Visit: Payer: Self-pay

## 2019-08-16 ENCOUNTER — Ambulatory Visit (INDEPENDENT_AMBULATORY_CARE_PROVIDER_SITE_OTHER): Payer: BC Managed Care – PPO | Admitting: Cardiology

## 2019-08-16 ENCOUNTER — Encounter: Payer: Self-pay | Admitting: Cardiology

## 2019-08-16 VITALS — BP 138/82 | HR 77 | Ht 68.0 in | Wt 292.1 lb

## 2019-08-16 DIAGNOSIS — I10 Essential (primary) hypertension: Secondary | ICD-10-CM

## 2019-08-16 DIAGNOSIS — I480 Paroxysmal atrial fibrillation: Secondary | ICD-10-CM

## 2019-08-16 NOTE — Progress Notes (Signed)
Cardiology Office Note:    Date:  08/16/2019   ID:  [name], DOB 1956-03-03, MRN [id number]  PCP:  Steele Sizer, MD  Cardiologist:  Kate Sable, MD  Electrophysiologist:  None   Referring MD: Steele Sizer, MD   Chief Complaint  Patient presents with  . OTHER    6 month f/u. Meds reviewed verbally with pt.    History of Present Illness:    Regina Christensen is a 64 y.o. female with a hx of hypertension, paroxysmal A. fib on Eliquis, obesity, sleep apnea who presents for follow-up.  Her metoprolol titrated to 100 mg twice daily since last visit.  She has been doing okay on increased dose of beta-blocker.  Denies any palpitations or chest pain.  Had a right knee replacement 2 months ago, currently undergoing physical therapy.   Past Medical History:  Diagnosis Date  . Arthritis   . Depression   . Hypertension   . Morbid obesity with BMI of 45.0-49.9, adult ([place])   . Obesity   . Sleep apnea     Past Surgical History:  Procedure Laterality Date  . ABDOMINAL HYSTERECTOMY    . BREAST EXCISIONAL BIOPSY Left   . ENDOMETRIAL ABLATION     x2  . GASTRIC BYPASS     sleeve    Current Medications: Current Meds  Medication Sig  . acetaminophen (TYLENOL 8 HOUR ARTHRITIS PAIN) 650 MG CR tablet Take 1 tablet (650 mg total) by mouth every 8 (eight) hours as needed for pain. (Patient taking differently: Take 1,300 mg by mouth every 8 (eight) hours as needed for pain. )  . amLODipine (NORVASC) 2.5 MG tablet TAKE 1 TABLET BY MOUTH EVERY DAY  . apixaban (ELIQUIS) 5 MG TABS tablet Take 1 tablet (5 mg total) by mouth 2 (two) times daily.  Marland Kitchen azelastine (ASTELIN) 0.1 % nasal spray PLACE 2 SPRAYS INTO BOTH NOSTRILS 2 (TWO) TIMES DAILY  . cloNIDine (CATAPRES - DOSED IN MG/24 HR) 0.1 mg/24hr patch PLACE 1 PATCH (0.1 MG TOTAL) ONTO THE SKIN ONCE A WEEK.  . fexofenadine (ALLEGRA) 180 MG tablet Take 180 mg by mouth daily.  Marland Kitchen GOODSENSE STIMULANT LAXATIVE 8.6-50 MG tablet 2 EACH  PO QAM  . HYDROcodone-acetaminophen (NORCO/VICODIN) 5-325 MG tablet Take 1 tablet by mouth.  Marland Kitchen HYDROcodone-acetaminophen (NORCO/VICODIN) 5-325 MG tablet hydrocodone 5 mg-acetaminophen 325 mg tablet  . metoprolol tartrate (LOPRESSOR) 100 MG tablet Take 1 tablet (100 mg total) by mouth 2 (two) times daily.  Marland Kitchen oxyCODONE (OXY IR/ROXICODONE) 5 MG immediate release tablet SMARTSIG:5 Milligram(s) By Mouth Every 4 Hours PRN  . OZEMPIC, 1 MG/DOSE, 2 MG/1.5ML SOPN INJECT 1 MG INTO THE SKIN ONCE A WEEK.  . pregabalin (LYRICA) 50 MG capsule Take 1 capsule (50 mg total) by mouth 3 (three) times daily.  Marland Kitchen venlafaxine XR (EFFEXOR XR) 75 MG 24 hr capsule Take 1 capsule (75 mg total) by mouth daily with breakfast. To be combined with 150 mg  . venlafaxine XR (EFFEXOR-XR) 150 MG 24 hr capsule To be combined with 75 mg  . XIIDRA 5 % SOLN Place 1 drop into both eyes 2 (two) times daily.     Allergies:   Ace inhibitors and Hctz [hydrochlorothiazide]   Social History   Socioeconomic History  . Marital status: Divorced    Spouse name: Not on file  . Number of children: 2  . Years of education: Not on file  . Highest education level: Associate degree: occupational, Hotel manager, or vocational program  Occupational History  . Occupation: patient Tax adviser: [employer]  Tobacco Use  . Smoking status: Never Smoker  . Smokeless tobacco: Never Used  Substance and Sexual Activity  . Alcohol use: No    Alcohol/week: 0.0 standard drinks    Comment: rare  . Drug use: No  . Sexual activity: Never  Other Topics Concern  . 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 Difficulty of Paying Living Expenses:   Food Insecurity:   . Worried About Programme researcher, broadcasting/film/video in the Last Year:   . Barista in  the Last Year:   Transportation Needs:   . Freight forwarder (Medical):   Marland Kitchen Lack of Transportation (Non-Medical):   Physical Activity:   . Days of Exercise per Week:   . Minutes of Exercise per Session:   Stress:   . Feeling of Stress :   Social Connections:   . Frequency of Communication with Friends and Family:   . Frequency of Social Gatherings with Friends and Family:   . Attends Religious Services:   . Active Member of Clubs or Organizations:   . Attends Banker Meetings:   Marland Kitchen Marital Status:      Family History: The patient's family history includes Cancer in her brother; Heart disease in her father. There is no history of Mental illness.  Her sister has atrial fibrillation, multiple nephews have atrial fibrillation.  ROS:   Please see the history of present illness.     All other systems reviewed and are negative.  EKGs/Labs/Other Studies Reviewed:    The following studies were reviewed today: Echo 07/2019  1. Left ventricular ejection fraction, by estimation, is 55 to 60%. The  left ventricle has normal function. The left ventricle has no regional  wall motion abnormalities. The left ventricular internal cavity size was  mildly dilated. There is mild left  ventricular hypertrophy. Left ventricular diastolic parameters are  consistent with Grade I diastolic dysfunction (impaired relaxation).  Elevated left atrial pressure. The average left ventricular global  longitudinal strain is -15.5 %.  2. Right ventricular systolic function is normal. The right ventricular  size is normal. There is normal pulmonary artery systolic pressure.  3. Left atrial size was mildly dilated.  4. Right atrial size was mildly dilated.  5. The mitral valve is normal in structure. Mild mitral valve  regurgitation.  6. Tricuspid valve regurgitation is mild to moderate.  7. The aortic valve is tricuspid. Aortic valve regurgitation is not  visualized. No aortic stenosis  is present.  8. The inferior vena cava is normal in size with greater than 50%  respiratory variability, suggesting right atrial pressure of 3 mmHg.  EKG:  EKG is  ordered today.  The ekg ordered today demonstrates normal sinus rhythm, PACs.  Recent Labs: 02/03/2019: TSH 1.288 02/07/2019: ALT 92 02/08/2019: BUN 14; Creatinine, Ser 0.76; Potassium 3.5; Sodium 139 03/07/2019: Hemoglobin 13.7; Platelets 329  Recent Lipid Panel    Component Value Date/Time   CHOL 107 02/08/2019 0513   CHOL 151 12/05/2014 1117   TRIG 139 02/08/2019 0513   HDL 49 02/08/2019 0513   HDL 74 12/05/2014 1117   CHOLHDL 2.2 02/08/2019 0513  VLDL 28 02/08/2019 0513   LDLCALC 30 02/08/2019 0513   LDLCALC 63 02/05/2018 1532    Physical Exam:    VS:  BP 138/82 (BP Location: Left Arm, Patient Position: Sitting, Cuff Size: Large)   Pulse 77   Ht 5\' 8"  (1.727 m)   Wt 292 lb 2 oz (132.5 kg)   SpO2 98%   BMI 44.42 kg/m     Wt Readings from Last 3 Encounters:  08/16/19 292 lb 2 oz (132.5 kg)  05/13/19 285 lb 14.4 oz (129.7 kg)  02/15/19 285 lb 4 oz (129.4 kg)     GEN:  Well nourished, well developed in no acute distress HEENT: Normal NECK: No JVD; No carotid bruits LYMPHATICS: No lymphadenopathy CARDIAC:RRR, no murmurs, rubs, gallops RESPIRATORY:  Clear to auscultation without rales, wheezing or rhonchi  ABDOMEN: Soft, non-tender, distended MUSCULOSKELETAL:  No edema; No deformity  SKIN: Warm and dry NEUROLOGIC:  Alert and oriented x 3 PSYCHIATRIC:  Normal affect   ASSESSMENT:    1. Paroxysmal atrial fibrillation (HCC)   2. Essential hypertension   3. Morbid obesity (HCC)    PLAN:     1. Paroxysmal atrial fibrillation, currently in sinus rhythm.  CHA2DS2-VASc score of 2.  Continue Eliquis 5 mg twice daily, continue Lopressor 100 mg twice daily.  Last echo with normal systolic function, mildly dilated left atrium, impaired relaxation.. 2. History of hypertension, BP well controlled.  Continue  current BP meds. 3. Patient is morbidly obese.  Weight loss advised.   Follow-up 12 months   This note was generated in part or whole with voice recognition software. Voice recognition is usually quite accurate but there are transcription errors that can and very often do occur. I apologize for any typographical errors that were not detected and corrected.  Medication Adjustments/Labs and Tests Ordered: Current medicines are reviewed at length with the patient today.  Concerns regarding medicines are outlined above.  Orders Placed This Encounter  Procedures  . EKG 12-Lead   No orders of the defined types were placed in this encounter.   Patient Instructions  Medication Instructions:  Your physician recommends that you continue on your current medications as directed. Please refer to the Current Medication list given to you today.  *If you need a refill on your cardiac medications before your next appointment, please call your pharmacy*  Follow-Up: At [facility], you and your health needs are our priority.  As part of our continuing mission to provide you with exceptional heart care, we have created designated Provider Care Teams.  These Care Teams include your primary Cardiologist (physician) and Advanced Practice Providers (APPs -  Physician Assistants and Nurse Practitioners) who all work together to provide you with the care you need, when you need it.  We recommend signing up for the patient portal called "MyChart".  Sign up information is provided on this After Visit Summary.  MyChart is used to connect with patients for Virtual Visits (Telemedicine).  Patients are able to view lab/test results, encounter notes, upcoming appointments, etc.  Non-urgent messages can be sent to your provider as well.   To learn more about what you can do with MyChart, go to [facility].    Your next appointment:   12 month(s)  The format for your next appointment:   In  Person  Provider:   [email or web address], MD     Signed, Debbe Odea, MD  08/16/2019 9:34 AM    [facility]

## 2019-08-16 NOTE — Patient Instructions (Signed)
Medication Instructions:  Your physician recommends that you continue on your current medications as directed. Please refer to the Current Medication list given to you today.  *If you need a refill on your cardiac medications before your next appointment, please call your pharmacy*  Follow-Up: At [facility], you and your health needs are our priority.  As part of our continuing mission to provide you with exceptional heart care, we have created designated Provider Care Teams.  These Care Teams include your primary Cardiologist (physician) and Advanced Practice Providers (APPs -  Physician Assistants and Nurse Practitioners) who all work together to provide you with the care you need, when you need it.  We recommend signing up for the patient portal called "MyChart".  Sign up information is provided on this After Visit Summary.  MyChart is used to connect with patients for Virtual Visits (Telemedicine).  Patients are able to view lab/test results, encounter notes, upcoming appointments, etc.  Non-urgent messages can be sent to your provider as well.   To learn more about what you can do with MyChart, go to [email or web address].    Your next appointment:   12 month(s)  The format for your next appointment:   In Person  Provider:   Brian Agbor-Etang, MD   

## 2019-08-26 ENCOUNTER — Encounter: Payer: Self-pay | Admitting: Family Medicine

## 2019-08-26 ENCOUNTER — Ambulatory Visit (INDEPENDENT_AMBULATORY_CARE_PROVIDER_SITE_OTHER): Payer: BC Managed Care – PPO | Admitting: Family Medicine

## 2019-08-26 ENCOUNTER — Other Ambulatory Visit: Payer: Self-pay

## 2019-08-26 DIAGNOSIS — I10 Essential (primary) hypertension: Secondary | ICD-10-CM | POA: Diagnosis not present

## 2019-08-26 DIAGNOSIS — G500 Trigeminal neuralgia: Secondary | ICD-10-CM

## 2019-08-26 DIAGNOSIS — E8881 Metabolic syndrome: Secondary | ICD-10-CM

## 2019-08-26 DIAGNOSIS — F325 Major depressive disorder, single episode, in full remission: Secondary | ICD-10-CM

## 2019-08-26 DIAGNOSIS — I4891 Unspecified atrial fibrillation: Secondary | ICD-10-CM

## 2019-08-26 MED ORDER — PREGABALIN 50 MG PO CAPS: 50.0000 mg | ORAL_CAPSULE | Freq: Three times a day (TID) | ORAL | 2 refills | Status: DC

## 2019-08-26 MED ORDER — CLONIDINE 0.1 MG/24HR TD PTWK: MEDICATED_PATCH | TRANSDERMAL | 1 refills | Status: DC

## 2019-08-26 MED ORDER — OZEMPIC (1 MG/DOSE) 2 MG/1.5ML ~~LOC~~ SOPN: 1.0000 mg | PEN_INJECTOR | SUBCUTANEOUS | 1 refills | Status: DC

## 2019-08-26 MED ORDER — AMLODIPINE BESYLATE 2.5 MG PO TABS: 2.5000 mg | ORAL_TABLET | Freq: Every day | ORAL | 0 refills | Status: DC

## 2019-08-26 NOTE — Progress Notes (Signed)
Name: Regina Christensen   MRN: [id number]    DOB: [birth year]   Date:08/26/2019       Progress Note  Subjective  Chief Complaint  Chief Complaint  Patient presents with  . Depression  . Hypertension  . Atrial Fibrillation  . Obesity    HPI  Afib: she states since she has been on higher dose of Metoprolol at 100 mg BID she states episodes of palpitation  are seldom. She denies association with sob or chest pain. She has been taking Eliquis and denies bleeding.   MDD/GAD: she is seeing Dr. Elna Breslow and also therapist. 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 She is compliant with medication and is taking Effexor 225 mg daily . Phq 9 has improved. Lives with daughter, two grand-daughter and son in Social worker. She feels supported by her family   Insomnia: she states Dr. Elna Breslow , she is only on Melatonin   HTN: she is only on lopressor and clonidine patch, we added norvasc 2.5 mg on her last visit, today bp is towards low end of normal, she denies chest pain or dizziness. We will continue current dose and consider changing to arb instead   Morbid obesity: she states difficulty exercising because of OA , had right knee surgery but still has pain on left side. She has gained weight since last visit, discussed dietary modifications.   OA knees: had surgery right knee, TKR and will go for Left knee replacement before the end of this year. Doing better with right side, still has some pain going up and down stairs, left knee is weaker and has grinding sensation  Trigeminal neuralgia. She used to take tegretol in the past and needs a refill, pain is shooting and had been gone for years but had some pain end of December, pain has improved but still happens intermittently , she stopped lyrica because she was given pain medication for her knee, explained she can still take it if needed.  Pre-diabetes: she is on Ozempic, last A1C was 6.3 %, she denies polyphagia,  polyuria or polydipsia     Patient Active Problem List   Diagnosis Date Noted  . Leukocytosis 07/19/2019  . History of hysterectomy 07/19/2019  . Drowsy 07/19/2019  . Anxiety 07/19/2019  . MDD (major depressive disorder), recurrent, in full remission (HCC) 07/19/2019  . Insomnia 07/19/2019  . History of total knee arthroplasty 06/06/2019  . A-fib (HCC) 02/08/2019  . Atrial fibrillation with rapid ventricular response (HCC) 02/07/2019  . Atrial fibrillation, new onset (HCC) 02/03/2019  . Bilateral carpal tunnel syndrome 07/30/2016  . OSA (obstructive sleep apnea) 03/07/2016  . MDD (major depressive disorder), recurrent episode, mild (HCC) 11/30/2015  . Osteoarthritis of both knees 11/30/2015  . BPPV (benign paroxysmal positional vertigo) 08/28/2015  . Essential hypertension 11/09/2014  . Hyperglycemia 11/09/2014  . GAD (generalized anxiety disorder) 11/09/2014  . Acanthosis nigricans 11/09/2014  . Morbid obesity (HCC) 01/06/2012    Past Surgical History:  Procedure Laterality Date  . ABDOMINAL HYSTERECTOMY    . BREAST EXCISIONAL BIOPSY Left   . ENDOMETRIAL ABLATION     x2  . GASTRIC BYPASS     sleeve    Family History  Problem Relation Age of Onset  . Heart disease Father   . Cancer Brother   . Mental illness Neg Hx     Social History   Tobacco Use  . Smoking status: Never Smoker  . Smokeless tobacco: Never Used  Substance Use Topics  . Alcohol use: No    Alcohol/week: 0.0 standard drinks    Comment: rare     Current Outpatient Medications:  .  acetaminophen (TYLENOL 8 HOUR ARTHRITIS PAIN) 650 MG CR tablet, Take 1 tablet (650 mg total) by mouth every 8 (eight) hours as needed for pain. (Patient taking differently: Take 1,300 mg by mouth every 8 (eight) hours as needed for pain. ), Disp: 90 tablet, Rfl: 0 .  amLODipine (NORVASC) 2.5 MG tablet, TAKE 1 TABLET BY MOUTH EVERY DAY, Disp: 90 tablet, Rfl: 0 .  apixaban (ELIQUIS) 5 MG TABS tablet, Take 1 tablet (5  mg total) by mouth 2 (two) times daily., Disp: 180 tablet, Rfl: 2 .  azelastine (ASTELIN) 0.1 % nasal spray, PLACE 2 SPRAYS INTO BOTH NOSTRILS 2 (TWO) TIMES DAILY, Disp: 30 mL, Rfl: 1 .  cloNIDine (CATAPRES - DOSED IN MG/24 HR) 0.1 mg/24hr patch, PLACE 1 PATCH (0.1 MG TOTAL) ONTO THE SKIN ONCE A WEEK., Disp: 12 patch, Rfl: 0 .  cyclobenzaprine (FLEXERIL) 10 MG tablet, Take 10 mg by mouth as needed. , Disp: , Rfl:  .  famotidine (PEPCID) 20 MG tablet, TAKE 1 TABLET BY MOUTH EVERY DAY, Disp: 90 tablet, Rfl: 0 .  fexofenadine (ALLEGRA) 180 MG tablet, Take 180 mg by mouth daily., Disp: , Rfl:  .  HYDROcodone-acetaminophen (NORCO/VICODIN) 5-325 MG tablet, hydrocodone 5 mg-acetaminophen 325 mg tablet, Disp: , Rfl:  .  metoprolol tartrate (LOPRESSOR) 100 MG tablet, Take 1 tablet (100 mg total) by mouth 2 (two) times daily., Disp: 180 tablet, Rfl: 1 .  OZEMPIC, 1 MG/DOSE, 2 MG/1.5ML SOPN, INJECT 1 MG INTO THE SKIN ONCE A WEEK., Disp: 9 mL, Rfl: 1 .  pregabalin (LYRICA) 50 MG capsule, Take 1 capsule (50 mg total) by mouth 3 (three) times daily., Disp: 90 capsule, Rfl: 0 .  venlafaxine XR (EFFEXOR XR) 75 MG 24 hr capsule, Take 1 capsule (75 mg total) by mouth daily with breakfast. To be combined with 150 mg, Disp: 90 capsule, Rfl: 0 .  venlafaxine XR (EFFEXOR-XR) 150 MG 24 hr capsule, To be combined with 75 mg, Disp: 90 capsule, Rfl: 0 .  XIIDRA 5 % SOLN, Place 1 drop into both eyes 2 (two) times daily., Disp: , Rfl: 4 .  GOODSENSE STIMULANT LAXATIVE 8.6-50 MG tablet, 2 EACH PO QAM, Disp: , Rfl:   Allergies  Allergen Reactions  . Ace Inhibitors Cough  . Hctz [Hydrochlorothiazide] Rash    face    I personally reviewed active problem list, medication list, allergies, family history, social history, health maintenance with the patient/caregiver today.   ROS  Constitutional: Negative for fever, positive for  weight change.  Respiratory: Negative for cough and shortness of breath.   Cardiovascular:  Negative for chest pain or palpitations.  Gastrointestinal: Negative for abdominal pain, no bowel changes.  Musculoskeletal: Negative for gait problem , positive for intermittent  joint swelling.  Skin: Negative for rash.  Neurological: Negative for dizziness or headache.  No other specific complaints in a complete review of systems (except as listed in HPI above).   Objective  Vitals:   08/26/19 1510  BP: 118/86  Pulse: (!) 101  Resp: 16  Temp: 98.1 F (36.7 C)  TempSrc: Temporal  SpO2: 97%  Weight: 295 lb 11.2 oz (134.1 kg)  Height: 5\' 8"  (1.727 m)    Body mass index is 44.96 kg/m.  Physical Exam  Constitutional: Patient appears well-developed and well-nourished. Obese  No distress.  HEENT: head atraumatic, normocephalic, pupils equal and reactive to light Cardiovascular: Normal rate, regular rhythm and normal heart sounds.  No murmur heard. No BLE edema. Pulmonary/Chest: Effort normal and breath sounds normal. No respiratory distress. Abdominal: Soft.  There is no tenderness. Muscular Skeletal: s/p right knee surgery no effusion, normal gait, some crepitus with extension of left knee  Psychiatric: Patient has a normal mood and affect. behavior is normal. Judgment and thought content normal.  PHQ2/9: Depression screen [facility] 2/9 08/26/2019 05/13/2019 04/06/2019 03/30/2019 03/02/2019  Decreased Interest 0 0 0 0 0  Down, Depressed, Hopeless 0 0 0 0 1  PHQ - 2 Score 0 0 0 0 1  Altered sleeping 1 0 0 0 1  Tired, decreased energy 1 1 0 0 1  Change in appetite 0 0 0 0 1  Feeling bad or failure about yourself  0 0 0 0 0  Trouble concentrating 0 0 0 0 1  Moving slowly or fidgety/restless 0 0 0 0 0  Suicidal thoughts 0 0 0 0 0  PHQ-9 Score 2 1 0 0 5  Difficult doing work/chores Not difficult at all Not difficult at all Not difficult at all Not difficult at all Very difficult  Some encounter information is confidential and restricted. Go to Review Flowsheets activity to see all  data.  Some recent data might be hidden    phq 9 is negative  Fall Risk: Fall Risk  08/26/2019 05/13/2019 04/06/2019 03/30/2019 03/02/2019  Falls in the past year? 0 0 0 0 0  Number falls in past yr: 0 0 0 0 0  Injury with Fall? 0 0 0 0 0  Risk for fall due to : - - - - -  Follow up - - - Falls evaluation completed -    Functional Status Survey: Is the patient deaf or have difficulty hearing?: No Does the patient have difficulty seeing, even when wearing glasses/contacts?: No Does the patient have difficulty concentrating, remembering, or making decisions?: No Does the patient have difficulty walking or climbing stairs?: No Does the patient have difficulty dressing or bathing?: No Does the patient have difficulty doing errands alone such as visiting a doctor's office or shopping?: No    Assessment & Plan  1. Metabolic syndrome  - Semaglutide, 1 MG/DOSE, (OZEMPIC, 1 MG/DOSE,) 2 MG/1.5ML SOPN; Inject 1 mg into the skin once a week.  Dispense: 9 mL; Refill: 1  2. Essential hypertension  - cloNIDine (CATAPRES - DOSED IN MG/24 HR) 0.1 mg/24hr patch; One weekly  Dispense: 12 patch; Refill: 1 - amLODipine (NORVASC) 2.5 MG tablet; Take 1 tablet (2.5 mg total) by mouth daily.  Dispense: 90 tablet; Refill: 0  3. Trigeminal neuralgia of left side of face  - pregabalin (LYRICA) 50 MG capsule; Take 1 capsule (50 mg total) by mouth 3 (three) times daily.  Dispense: 90 capsule; Refill: 2  4. Morbid obesity (HCC)  Discussed with the patient the risk posed by an increased BMI. Discussed importance of portion control, calorie counting and at least 150 minutes of physical activity weekly. Avoid sweet beverages and drink more water. Eat at least 6 servings of fruit and vegetables daily   5. Atrial fibrillation with rapid ventricular response (HCC)  Rate controlled   6. Major depression in remission [facility])  Keep follow up with Dr. Elna Breslow

## 2019-09-09 ENCOUNTER — Other Ambulatory Visit: Payer: Self-pay

## 2019-09-09 MED ORDER — METOPROLOL TARTRATE 100 MG PO TABS: 100.0000 mg | ORAL_TABLET | Freq: Two times a day (BID) | ORAL | 3 refills | Status: DC

## 2019-09-20 ENCOUNTER — Other Ambulatory Visit: Payer: Self-pay | Admitting: Family Medicine

## 2019-09-20 DIAGNOSIS — I10 Essential (primary) hypertension: Secondary | ICD-10-CM

## 2019-09-23 ENCOUNTER — Telehealth: Payer: BC Managed Care – PPO | Admitting: Psychiatry

## 2019-09-29 ENCOUNTER — Telehealth: Payer: Self-pay | Admitting: Family Medicine

## 2019-09-29 NOTE — Telephone Encounter (Signed)
She needs a note stating it is ok for her to exercise. Please advise.

## 2019-09-29 NOTE — Telephone Encounter (Signed)
Pt states she needs a note stating she is able to exercise.

## 2019-10-05 NOTE — Telephone Encounter (Signed)
Moved into new apartment building. Needs note stating that it is ok to exercise.

## 2019-10-05 NOTE — Telephone Encounter (Signed)
Tried to call this patient to see why she needs a note to exercise. No answer. No vm setup.

## 2019-10-18 ENCOUNTER — Telehealth: Payer: BC Managed Care – PPO | Admitting: Psychiatry

## 2019-10-19 ENCOUNTER — Other Ambulatory Visit: Payer: Self-pay

## 2019-10-19 ENCOUNTER — Ambulatory Visit (INDEPENDENT_AMBULATORY_CARE_PROVIDER_SITE_OTHER): Payer: BC Managed Care – PPO | Admitting: Family Medicine

## 2019-10-19 ENCOUNTER — Telehealth (INDEPENDENT_AMBULATORY_CARE_PROVIDER_SITE_OTHER): Payer: BC Managed Care – PPO | Admitting: Psychiatry

## 2019-10-19 ENCOUNTER — Encounter: Payer: Self-pay | Admitting: Psychiatry

## 2019-10-19 ENCOUNTER — Encounter: Payer: Self-pay | Admitting: Family Medicine

## 2019-10-19 DIAGNOSIS — J3489 Other specified disorders of nose and nasal sinuses: Secondary | ICD-10-CM | POA: Diagnosis not present

## 2019-10-19 DIAGNOSIS — M170 Bilateral primary osteoarthritis of knee: Secondary | ICD-10-CM

## 2019-10-19 DIAGNOSIS — R05 Cough: Secondary | ICD-10-CM | POA: Diagnosis not present

## 2019-10-19 DIAGNOSIS — R058 Other specified cough: Secondary | ICD-10-CM

## 2019-10-19 DIAGNOSIS — F411 Generalized anxiety disorder: Secondary | ICD-10-CM

## 2019-10-19 DIAGNOSIS — F5105 Insomnia due to other mental disorder: Secondary | ICD-10-CM | POA: Diagnosis not present

## 2019-10-19 DIAGNOSIS — F3342 Major depressive disorder, recurrent, in full remission: Secondary | ICD-10-CM | POA: Diagnosis not present

## 2019-10-19 MED ORDER — HYDROCOD POLST-CPM POLST ER 10-8 MG/5ML PO SUER: 5.0000 mL | Freq: Two times a day (BID) | ORAL | 0 refills | Status: DC | PRN

## 2019-10-19 MED ORDER — VENLAFAXINE HCL ER 150 MG PO CP24: ORAL_CAPSULE | ORAL | 0 refills | Status: DC

## 2019-10-19 NOTE — Progress Notes (Signed)
Provider Location : [facility] Patient Location : [place]  Virtual Visit via Video Note  I connected with Regina Christensen on 10/19/19 at  8:45 AM EDT by a video enabled telemedicine application and verified that I am speaking with the correct person using two identifiers.   I discussed the limitations of evaluation and management by telemedicine and the availability of in person appointments. The patient expressed understanding and agreed to proceed.    I discussed the assessment and treatment plan with the patient. The patient was provided an opportunity to ask questions and all were answered. The patient agreed with the plan and demonstrated an understanding of the instructions.   The patient was advised to call back or seek an in-person evaluation if the symptoms worsen or if the condition fails to improve as anticipated.  BH MD OP Progress Note  10/19/2019 9:11 AM LISABETH MIAN  MRN:  [id number]  Chief Complaint:  Chief Complaint    Follow-up     HPI: Regina Christensen is a 64 year old African-American female, retired, lives in [place], has a history of GAD, MDD, hypertension, gastric bypass, hyperlipidemia, OSA, arthritis, was evaluated by telemedicine today.  A video call was initiated however due to connection problem it had to be changed to a phone call during the session.  Patient today reports she is currently making progress.  She denies any significant sadness, nervousness.  She is sleeping well.  She reports her appetite is fair.  She is scheduled to have a knee replacement surgery in October.  Patient is compliant on medications.  She denies side effects.  Discussed reducing the dosage of the antidepressants since her symptoms has been stable.  She is agreeable.  Patient denies any suicidality, homicidality or perceptual disturbances.  Visit Diagnosis:    ICD-10-CM   1. GAD (generalized anxiety disorder)  F41.1 venlafaxine XR (EFFEXOR-XR) 150 MG 24 hr  capsule    DISCONTINUED: venlafaxine XR (EFFEXOR-XR) 150 MG 24 hr capsule  2. MDD (major depressive disorder), recurrent, in full remission (HCC)  F33.42   3. Insomnia due to mental condition  F51.05     Past Psychiatric History: I have reviewed past psychiatric history from my progress note on 01/27/2019.  Past trials of Prozac, Effexor.  Past Medical History:  Past Medical History:  Diagnosis Date  . Arthritis   . Depression   . Hypertension   . Morbid obesity with BMI of 45.0-49.9, adult (HCC)   . Obesity   . Sleep apnea     Past Surgical History:  Procedure Laterality Date  . ABDOMINAL HYSTERECTOMY    . BREAST EXCISIONAL BIOPSY Left   . ENDOMETRIAL ABLATION     x2  . GASTRIC BYPASS     sleeve    Family Psychiatric History: I have reviewed family psychiatric history from my progress note on 01/27/2019.  Family History:  Family History  Problem Relation Age of Onset  . Heart disease Father   . Cancer Brother   . Mental illness Neg Hx     Social History: Reviewed social history from my progress note on 01/27/2019. Social History   Socioeconomic History  . Marital status: Divorced    Spouse name: Not on file  . Number of children: 2  . Years of education: Not on file  . Highest education level: Associate degree: occupational, Scientist, product/process development, or vocational program  Occupational History  . Occupation: patient Tax adviser: [employer]  Tobacco Use  . Smoking status: Never  Smoker  . Smokeless tobacco: Never Used  Vaping Use  . Vaping Use: Never used  Substance and Sexual Activity  . Alcohol use: No    Alcohol/week: 0.0 standard drinks    Comment: rare  . Drug use: No  . Sexual activity: Never  Other Topics Concern  . 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 Difficulty of Paying Living Expenses:   Food Insecurity:   . Worried About Programme researcher, broadcasting/film/video in the Last Year:   . Barista in the Last Year:   Transportation Needs:   . Freight forwarder (Medical):   Marland Kitchen Lack of Transportation (Non-Medical):   Physical Activity:   . Days of Exercise per Week:   . Minutes of Exercise per Session:   Stress:   . Feeling of Stress :   Social Connections:   . Frequency of Communication with Friends and Family:   . Frequency of Social Gatherings with Friends and Family:   . Attends Religious Services:   . Active Member of Clubs or Organizations:   . Attends Banker Meetings:   Marland Kitchen Marital Status:     Allergies:  Allergies  Allergen Reactions  . Ace Inhibitors Cough  . Hctz [Hydrochlorothiazide] Rash    face    Metabolic Disorder Labs: Lab Results  Component Value Date   HGBA1C 6.3 (H) 02/03/2019   MPG 134.11 02/03/2019   MPG 120 02/05/2018   No results found for: PROLACTIN Lab Results  Component Value Date   CHOL 107 02/08/2019   TRIG 139 02/08/2019   HDL 49 02/08/2019   CHOLHDL 2.2 02/08/2019   VLDL 28 02/08/2019   LDLCALC 30 02/08/2019   LDLCALC 36 02/03/2019   Lab Results  Component Value Date   TSH 1.288 02/03/2019   TSH 0.807 12/05/2014    Therapeutic Level Labs: No results found for: LITHIUM No results found for: VALPROATE No components found for:  CBMZ  Current Medications: Current Outpatient Medications  Medication Sig Dispense Refill  . acetaminophen (TYLENOL 8 HOUR ARTHRITIS PAIN) 650 MG CR tablet Take 1 tablet (650 mg total) by mouth every 8 (eight) hours as needed for pain. (Patient taking differently: Take 1,300 mg by mouth every 8 (eight) hours as needed for pain. ) 90 tablet 0  . amLODipine (NORVASC) 2.5 MG tablet TAKE 1 TABLET BY MOUTH EVERY DAY 90 tablet 0  . apixaban (ELIQUIS) 5 MG TABS tablet Take 1 tablet (5 mg total) by mouth 2 (two) times daily. 180 tablet 2  .  azelastine (ASTELIN) 0.1 % nasal spray PLACE 2 SPRAYS INTO BOTH NOSTRILS 2 (TWO) TIMES DAILY 30 mL 1  . cloNIDine (CATAPRES - DOSED IN MG/24 HR) 0.1 mg/24hr patch One weekly 12 patch 1  . cyclobenzaprine (FLEXERIL) 10 MG tablet Take 10 mg by mouth as needed.     . famotidine (PEPCID) 20 MG tablet TAKE 1 TABLET BY MOUTH EVERY DAY 90 tablet 0  . fexofenadine (ALLEGRA) 180 MG tablet Take 180 mg by mouth daily.    Marland Kitchen GOODSENSE STIMULANT LAXATIVE 8.6-50 MG tablet 2 EACH PO QAM    . HYDROcodone-acetaminophen (NORCO/VICODIN) 5-325 MG tablet hydrocodone 5 mg-acetaminophen 325 mg tablet    .  metoprolol tartrate (LOPRESSOR) 100 MG tablet Take 1 tablet (100 mg total) by mouth 2 (two) times daily. 180 tablet 3  . pregabalin (LYRICA) 50 MG capsule Take 1 capsule (50 mg total) by mouth 3 (three) times daily. 90 capsule 2  . Semaglutide, 1 MG/DOSE, (OZEMPIC, 1 MG/DOSE,) 2 MG/1.5ML SOPN Inject 1 mg into the skin once a week. 9 mL 1  . venlafaxine XR (EFFEXOR-XR) 150 MG 24 hr capsule TAKE 1 CAPSULE  DAILY WITH BREAKFAST 90 capsule 0  . XIIDRA 5 % SOLN Place 1 drop into both eyes 2 (two) times daily.  4   No current facility-administered medications for this visit.     Musculoskeletal: Strength & Muscle Tone: UTA Gait & Station: UTA Patient leans: N/A  Psychiatric Specialty Exam: Review of Systems  Psychiatric/Behavioral: Negative for agitation, behavioral problems, confusion, decreased concentration, dysphoric mood, hallucinations, self-injury, sleep disturbance and suicidal ideas. The patient is not nervous/anxious and is not hyperactive.   All other systems reviewed and are negative.   There were no vitals taken for this visit.There is no height or weight on file to calculate BMI.  General Appearance: UTA  Eye Contact:  UTA  Speech:  Clear and Coherent  Volume:  Normal  Mood:  Euthymic  Affect:  UTA  Thought Process:  Goal Directed and Descriptions of Associations: Intact  Orientation:  Full  (Time, Place, and Person)  Thought Content: Logical   Suicidal Thoughts:  No  Homicidal Thoughts:  No  Memory:  Immediate;   Fair Recent;   Fair Remote;   Fair  Judgement:  Fair  Insight:  Fair  Psychomotor Activity:  UTA  Concentration:  Concentration: Fair and Attention Span: Fair  Recall:  FiservFair  Fund of Knowledge: Fair  Language: Fair  Akathisia:  No  Handed:  Right  AIMS (if indicated): UTA  Assets:  Communication Skills Desire for Improvement Housing Social Support  ADL's:  Intact  Cognition: WNL  Sleep:  Fair   Screenings: GAD-7     [facility]/27/2019 [facility]  Total GAD-7 Score 10 6 0 0 0    PHQ2-9     [facility]  PHQ-2 Total Score 0 0 0 0 1  PHQ-9 Total Score 2 1 0 0 5       Assessment and Plan: Regina QuaJosephine S Baldyga is a 64 year old African-American female, divorced, retired, [place] in [place], has a history of anxiety, depression, hypertension, hyperlipidemia, OSA, arthritis was evaluated by telemedicine today.  Patient is biologically predisposed given her health problems.  Patient with psychosocial stressors of the pandemic however is currently stable on medications.  Plan as noted below.  Plan GAD-stable We will reduce venlafaxine extended release to 150 mg p.o. daily Continue psychotherapy sessions with her therapist as needed  MDD in remission Reduce venlafaxine  extended release to 150 mg p.o. daily  Insomnia -stable Melatonin 5 mg p.o. nightly as needed Continue CPAP  Follow-up in clinic in 3 to 4 months or sooner if needed.  I have spent atleast 20 minutes non face to face with patient today. More than 50 % of the time was spent for preparing to see the patient ( e.g., review of test, records ), ordering medications and test ,psychoeducation and supportive psychotherapy and care coordination,as well as documenting clinical information in electronic health record. This note was generated in part or whole with voice recognition software. Voice recognition is usually quite accurate but there are transcription errors that can and very often do occur. I apologize for any typographical errors that were not detected and corrected.      Jomarie Longs, MD 10/19/2019, 9:11 AM

## 2019-10-19 NOTE — Progress Notes (Signed)
Name: Regina Christensen   MRN: [id number]    DOB: [birth year]   Date:10/19/2019       Progress Note  Subjective  Chief Complaint  Chief Complaint  Patient presents with  . Cough  . Sinus Problem    I connected with  [name] on 10/19/19 at 10:00 AM EDT by telephone and verified that I am speaking with the correct person using two identifiers.  I discussed the limitations, risks, security and privacy concerns of performing an evaluation and management service by telephone and the availability of in person appointments. Staff also discussed with the patient that there may be a patient responsible charge related to this service. Patient Location: at home  Provider Location: [facility]  HPI  URI: she states symptoms started 5 days ago, initially headache , post-nasal drainage and cough. Currently right nostril is congested. Having a constant headache on right frontal area and behind her right eye. No fever or chills. Appetite is okay. She had some nausea initially but no vomiting. She got some Mucinex DM yesterday and has helped a little. She states the cough is very bothersome now. No wheezing or SOB. She has been using her nasal spray and tessalon perrles . Advised saline spray multiple times a day    Patient Active Problem List   Diagnosis Date Noted  . Leukocytosis 07/19/2019  . History of hysterectomy 07/19/2019  . Drowsy 07/19/2019  . Anxiety 07/19/2019  . MDD (major depressive disorder), recurrent, in full remission (HCC) 07/19/2019  . Insomnia 07/19/2019  . History of total knee arthroplasty 06/06/2019  . A-fib (HCC) 02/08/2019  . Atrial fibrillation with rapid ventricular response (HCC) 02/07/2019  . Atrial fibrillation, new onset (HCC) 02/03/2019  . Bilateral carpal tunnel syndrome 07/30/2016  . OSA (obstructive sleep apnea) 03/07/2016  . MDD (major depressive disorder), recurrent episode, mild (HCC) 11/30/2015  . Osteoarthritis of both knees 11/30/2015  . BPPV  (benign paroxysmal positional vertigo) 08/28/2015  . Essential hypertension 11/09/2014  . Hyperglycemia 11/09/2014  . GAD (generalized anxiety disorder) 11/09/2014  . Acanthosis nigricans 11/09/2014  . Morbid obesity (HCC) 01/06/2012    Past Surgical History:  Procedure Laterality Date  . ABDOMINAL HYSTERECTOMY    . BREAST EXCISIONAL BIOPSY Left   . ENDOMETRIAL ABLATION     x2  . GASTRIC BYPASS     sleeve    Family History  Problem Relation Age of Onset  . Heart disease Father   . Cancer Brother   . Mental illness Neg Hx      Current Outpatient Medications:  .  acetaminophen (TYLENOL 8 HOUR ARTHRITIS PAIN) 650 MG CR tablet, Take 1 tablet (650 mg total) by mouth every 8 (eight) hours as needed for pain. (Patient taking differently: Take 1,300 mg by mouth every 8 (eight) hours as needed for pain. ), Disp: 90 tablet, Rfl: 0 .  amLODipine (NORVASC) 2.5 MG tablet, TAKE 1 TABLET BY MOUTH EVERY DAY, Disp: 90 tablet, Rfl: 0 .  apixaban (ELIQUIS) 5 MG TABS tablet, Take 1 tablet (5 mg total) by mouth 2 (two) times daily., Disp: 180 tablet, Rfl: 2 .  azelastine (ASTELIN) 0.1 % nasal spray, PLACE 2 SPRAYS INTO BOTH NOSTRILS 2 (TWO) TIMES DAILY, Disp: 30 mL, Rfl: 1 .  cloNIDine (CATAPRES - DOSED IN MG/24 HR) 0.1 mg/24hr patch, One weekly, Disp: 12 patch, Rfl: 1 .  famotidine (PEPCID) 20 MG tablet, TAKE 1 TABLET BY MOUTH EVERY DAY, Disp: 90 tablet, Rfl: 0 .  fexofenadine (  ALLEGRA) 180 MG tablet, Take 180 mg by mouth daily., Disp: , Rfl:  .  HYDROcodone-acetaminophen (NORCO/VICODIN) 5-325 MG tablet, hydrocodone 5 mg-acetaminophen 325 mg tablet, Disp: , Rfl:  .  metoprolol tartrate (LOPRESSOR) 100 MG tablet, Take 1 tablet (100 mg total) by mouth 2 (two) times daily., Disp: 180 tablet, Rfl: 3 .  pregabalin (LYRICA) 50 MG capsule, Take 1 capsule (50 mg total) by mouth 3 (three) times daily., Disp: 90 capsule, Rfl: 2 .  Semaglutide, 1 MG/DOSE, (OZEMPIC, 1 MG/DOSE,) 2 MG/1.5ML SOPN, Inject 1 mg into  the skin once a week., Disp: 9 mL, Rfl: 1 .  venlafaxine XR (EFFEXOR-XR) 150 MG 24 hr capsule, TAKE 1 CAPSULE  DAILY WITH BREAKFAST, Disp: 90 capsule, Rfl: 0 .  XIIDRA 5 % SOLN, Place 1 drop into both eyes 2 (two) times daily., Disp: , Rfl: 4 .  cyclobenzaprine (FLEXERIL) 10 MG tablet, Take 10 mg by mouth as needed.  (Patient not taking: Reported on 10/19/2019), Disp: , Rfl:  .  GOODSENSE STIMULANT LAXATIVE 8.6-50 MG tablet, 2 EACH PO QAM (Patient not taking: Reported on 10/19/2019), Disp: , Rfl:   Allergies  Allergen Reactions  . Ace Inhibitors Cough  . Hctz [Hydrochlorothiazide] Rash    face    I personally reviewed active problem list, medication list, allergies, family history, social history, health maintenance with the patient/caregiver today.   ROS  Ten systems reviewed and is negative except as mentioned in HPI   Objective  Virtual encounter, vitals not obtained.  There is no height or weight on file to calculate BMI.  Physical Exam  Awake, alert and oriented  PHQ2/9: Depression screen [facility] 2/9 10/19/2019 08/26/2019 05/13/2019 04/06/2019 03/30/2019  Decreased Interest 0 0 0 0 0  Down, Depressed, Hopeless 0 0 0 0 0  PHQ - 2 Score 0 0 0 0 0  Altered sleeping 0 1 0 0 0  Tired, decreased energy 0 1 1 0 0  Change in appetite 0 0 0 0 0  Feeling bad or failure about yourself  0 0 0 0 0  Trouble concentrating 0 0 0 0 0  Moving slowly or fidgety/restless 0 0 0 0 0  Suicidal thoughts 0 0 0 0 0  PHQ-9 Score 0 2 1 0 0  Difficult doing work/chores - Not difficult at all Not difficult at all Not difficult at all Not difficult at all  Some encounter information is confidential and restricted. Go to Review Flowsheets activity to see all data.  Some recent data might be hidden   PHQ-2/9 Result is negative.    Fall Risk: Fall Risk  10/19/2019 08/26/2019 05/13/2019 04/06/2019 03/30/2019  Falls in the past year? 0 0 0 0 0  Number falls in past yr: 0 0 0 0 0  Injury with Fall? 0 0 0 0 0    Risk for fall due to : - - - - -  Follow up - - - - Falls evaluation completed     Assessment & Plan  1. Sinus pressure  Explained likely viral or allergies, continue mucinex, add loratadine at night, saline spray and if no improvement send me a mychart message and I can send antibiotics.   2. Dry cough  - chlorpheniramine-HYDROcodone (TUSSIONEX PENNKINETIC ER) 10-8 MG/5ML SUER; Take 5 mLs by mouth every 12 (twelve) hours as needed.  Dispense: 140 mL; Refill: 0  I discussed the assessment and treatment plan with the patient. The patient was provided an opportunity to ask questions  and all were answered. The patient agreed with the plan and demonstrated an understanding of the instructions.  Advised patient max dose of tylenol per day is 3 grams   The patient was advised to call back or seek an in-person evaluation if the symptoms worsen or if the condition fails to improve as anticipated.  I provided 15 minutes of non-face-to-face time during this encounter.  Ruel Favors, MD

## 2019-10-21 ENCOUNTER — Other Ambulatory Visit: Payer: Self-pay | Admitting: Family Medicine

## 2019-10-21 ENCOUNTER — Telehealth: Payer: Self-pay | Admitting: Family Medicine

## 2019-10-21 MED ORDER — AZITHROMYCIN 500 MG PO TABS
500.0000 mg | ORAL_TABLET | Freq: Every day | ORAL | 0 refills | Status: DC
Start: 2019-10-21 — End: 2019-11-03

## 2019-10-21 NOTE — Telephone Encounter (Signed)
Patient called. She verbalized understanding. 

## 2019-10-21 NOTE — Telephone Encounter (Signed)
Pt states everything is still the same with her sinuses and she is asking for a antibiotic to be called into her pharmacy

## 2019-11-03 ENCOUNTER — Encounter: Payer: Self-pay | Admitting: Internal Medicine

## 2019-11-03 ENCOUNTER — Ambulatory Visit (INDEPENDENT_AMBULATORY_CARE_PROVIDER_SITE_OTHER): Payer: BC Managed Care – PPO | Admitting: Internal Medicine

## 2019-11-03 VITALS — Ht 68.0 in | Wt 295.0 lb

## 2019-11-03 DIAGNOSIS — R112 Nausea with vomiting, unspecified: Secondary | ICD-10-CM | POA: Diagnosis not present

## 2019-11-03 DIAGNOSIS — B349 Viral infection, unspecified: Secondary | ICD-10-CM

## 2019-11-03 DIAGNOSIS — R0789 Other chest pain: Secondary | ICD-10-CM | POA: Diagnosis not present

## 2019-11-03 NOTE — Progress Notes (Signed)
Name: Regina Christensen   MRN: [id number]    DOB: [birth year]   Date:11/03/2019       Progress Note  Subjective  Chief Complaint  Chief Complaint  Patient presents with  . Hand Pain    patient describes the pain as "arthritis pain" , started yesterday  . Neck Pain    pain goes from her right ear down into her neck, started yesterday    I connected with  [name] on 11/03/19 at  2:20 PM EDT by telephone and verified that I am speaking with the correct person using two identifiers.  I discussed the limitations, risks, security and privacy concerns of performing an evaluation and management service by telephone and the availability of in person appointments. Staff also discussed with the patient that there may be a patient responsible charge related to this service. Patient Location: Home Provider Location: [facility] Additional Individuals present: none  HPI  Patient is a 64 year old female patient of [name] Presents with the complaints of above. Is a telephone visit, and discussed at length the limitations of this type of visit with her complaints.  Patient noted left ear hurting down the side of her neck, and hands were hurting - felt like flu achiness, very tired, no swollen glands, throat a little sore. No fevers. Mon morning throwing up when got up and then went away, had loose stools after, goes 2X/day and loose stools, no blood, appetite down, not eating much. Soups, jello, ginger ale mostly.  Try a 1/2 chicken breast last evening and tolerated. Had a gastric sleeve done about 9 years ago, and careful with eating since. Not eat anything unusual before vomited, antibiotics 2+ weeks ago, and a 3 day course. No recent travel.  No loss of taste or smell, nose a little stuffy still, min cough and better since sinus infection (from a "10 down to a 1".). had Covid last October.  Tried some tylenol overnight,  Had a pain in her chest this morning, in the middle and more down  towards the stomach, no radiation, no SOB or other concerning sx's. No heart racing (has an Afib with RVR history). Went away and not recurred.   Had the Covid vaccine , two shots finished couple months ago Never smoker Patient Active Problem List   Diagnosis Date Noted  . Leukocytosis 07/19/2019  . History of hysterectomy 07/19/2019  . Drowsy 07/19/2019  . Anxiety 07/19/2019  . MDD (major depressive disorder), recurrent, in full remission (HCC) 07/19/2019  . Insomnia 07/19/2019  . History of total knee arthroplasty 06/06/2019  . A-fib (HCC) 02/08/2019  . Atrial fibrillation with rapid ventricular response (HCC) 02/07/2019  . Atrial fibrillation, new onset (HCC) 02/03/2019  . Bilateral carpal tunnel syndrome 07/30/2016  . OSA (obstructive sleep apnea) 03/07/2016  . MDD (major depressive disorder), recurrent episode, mild (HCC) 11/30/2015  . Osteoarthritis of both knees 11/30/2015  . BPPV (benign paroxysmal positional vertigo) 08/28/2015  . Essential hypertension 11/09/2014  . Hyperglycemia 11/09/2014  . GAD (generalized anxiety disorder) 11/09/2014  . Acanthosis nigricans 11/09/2014  . Morbid obesity (HCC) 01/06/2012    Past Surgical History:  Procedure Laterality Date  . ABDOMINAL HYSTERECTOMY    . BREAST EXCISIONAL BIOPSY Left   . ENDOMETRIAL ABLATION     x2  . GASTRIC BYPASS     sleeve    Family History  Problem Relation Age of Onset  . Heart disease Father   . Cancer Brother   . Mental illness  Neg Hx     Social History   Tobacco Use  . Smoking status: Never Smoker  . Smokeless tobacco: Never Used  Substance Use Topics  . Alcohol use: No    Alcohol/week: 0.0 standard drinks    Comment: rare     Current Outpatient Medications:  .  acetaminophen (TYLENOL 8 HOUR ARTHRITIS PAIN) 650 MG CR tablet, Take 1 tablet (650 mg total) by mouth every 8 (eight) hours as needed for pain. (Patient taking differently: Take 1,300 mg by mouth every 8 (eight) hours as needed  for pain. ), Disp: 90 tablet, Rfl: 0 .  amLODipine (NORVASC) 2.5 MG tablet, TAKE 1 TABLET BY MOUTH EVERY DAY (Patient taking differently: 5 mg. ), Disp: 90 tablet, Rfl: 0 .  apixaban (ELIQUIS) 5 MG TABS tablet, Take 1 tablet (5 mg total) by mouth 2 (two) times daily., Disp: 180 tablet, Rfl: 2 .  azelastine (ASTELIN) 0.1 % nasal spray, PLACE 2 SPRAYS INTO BOTH NOSTRILS 2 (TWO) TIMES DAILY, Disp: 30 mL, Rfl: 1 .  cloNIDine (CATAPRES - DOSED IN MG/24 HR) 0.1 mg/24hr patch, One weekly, Disp: 12 patch, Rfl: 1 .  famotidine (PEPCID) 20 MG tablet, TAKE 1 TABLET BY MOUTH EVERY DAY, Disp: 90 tablet, Rfl: 0 .  fexofenadine (ALLEGRA) 180 MG tablet, Take 180 mg by mouth daily., Disp: , Rfl:  .  HYDROcodone-acetaminophen (NORCO/VICODIN) 5-325 MG tablet, Takes as needed for knee, off and on, Disp: , Rfl:  .  metoprolol tartrate (LOPRESSOR) 100 MG tablet, Take 1 tablet (100 mg total) by mouth 2 (two) times daily., Disp: 180 tablet, Rfl: 3 .  Semaglutide, 1 MG/DOSE, (OZEMPIC, 1 MG/DOSE,) 2 MG/1.5ML SOPN, Inject 1 mg into the skin once a week., Disp: 9 mL, Rfl: 1 .  venlafaxine XR (EFFEXOR-XR) 150 MG 24 hr capsule, TAKE 1 CAPSULE  DAILY WITH BREAKFAST, Disp: 90 capsule, Rfl: 0 .  XIIDRA 5 % SOLN, Place 1 drop into both eyes 2 (two) times daily., Disp: , Rfl: 4  Allergies  Allergen Reactions  . Ace Inhibitors Cough  . Hctz [Hydrochlorothiazide] Rash    face    With staff assistance, above reviewed with the  today.  ROS: As per HPI, otherwise no specific complaints on a limited and focused system review   Objective  Virtual encounter, vitals not obtained.  Body mass index is 44.85 kg/m.  Physical Exam   Appears in NAD via conversation, very pleasant Breathing: No obvious respiratory distress. Speaking in complete sentences Neurological: Pt is alert and oriented, Speech is normal Psychiatric: Patient has a normal mood and affect, behavior is normal. Judgment and thought content normal.   No results  found for this or any previous visit (from the past 72 hour(s)).  PHQ2/9: Depression screen [facility] 2/9 11/03/2019 10/19/2019 08/26/2019 05/13/2019 04/06/2019  Decreased Interest 0 0 0 0 0  Down, Depressed, Hopeless 0 0 0 0 0  PHQ - 2 Score 0 0 0 0 0  Altered sleeping 0 0 1 0 0  Tired, decreased energy 0 0 1 1 0  Change in appetite 0 0 0 0 0  Feeling bad or failure about yourself  0 0 0 0 0  Trouble concentrating 0 0 0 0 0  Moving slowly or fidgety/restless 0 0 0 0 0  Suicidal thoughts 0 0 0 0 0  PHQ-9 Score 0 0 2 1 0  Difficult doing work/chores Not difficult at all - Not difficult at all Not difficult at all Not difficult at  all  Some recent data might be hidden   PHQ-2/9 Result reviewed  Fall Risk: Fall Risk  11/03/2019 10/19/2019 08/26/2019 05/13/2019 04/06/2019  Falls in the past year? 0 0 0 0 0  Number falls in past yr: 0 0 0 0 0  Injury with Fall? 0 0 0 0 0  Risk for fall due to : - - - - -  Follow up - - - - -     Assessment & Plan  1. Viral syndrome Discussed with patient that there is a good chance this is a infectious source, likely viral syndrome.  She states she feels like she has the flu, very achy and tired.  She described the hand pains as feeling like that, and also the neck pain.  No frank rigidity.  She had a sinus infection 2+ weeks ago, and that is better now.  Still with minimal drainage, minimal cough but not marked.  Discussed the possibility of Covid, and she noted she had before in October, and does not feel the same.  She also has had the vaccine.  She did have some vomiting 1 morning, with some loose stools persisting after. Discussed some of the limitations with this being a phone visit.  And the importance of closely monitoring. Emphasized staying well-hydrated Can continue the Tylenol products that she has tried here recently, and avoiding NSAIDs presently given the prior vomiting and the NSAIDs can upset her stomach some. She is also status post bariatric  surgery, although that was many years ago.   2. Non-intractable vomiting with nausea, unspecified vomiting type As above noted.  Was just one morning.  3. Other chest pain She did note a pain earlier with sounding more epigastric as she states it was below the breast area and substernal chest pain.  May be related to some acid issues, especially with the vomiting previous.  She had no concerning associated symptoms, no concerning radiation of this pain.  It resolved, and has remained resolved presently Emphasized if she has recurrence of pains in the chest, left-sided or central chest, especially if any concerning associated symptoms such as nausea and vomiting, increased sweatiness, or pain radiation up to the jaw or down to the arm, she needs to get to the emergency setting immediately.  She was understanding of that. This did not sound like concerning cardiac chest pain by description.  Emphasized to her if she is not improving or more problematic the importance of following up, and if she starts to develop more concerns for a Covid type illness with increasing cough, any fevers, loss of taste or smell, she needs to get Covid tested as well. She was understanding of the recommendations today, and await her response.  I discussed the assessment and treatment plan with the patient. The patient was provided an opportunity to ask questions and all were answered. The patient agreed with the plan and demonstrated an understanding of the instructions.  Red flags and when to present for emergency care or RTC including fever, chest pain, shortness of breath, new/worsening/un-resolving symptoms reviewed with patient at time of visit.   The patient was advised to call back or seek an in-person evaluation if the symptoms worsen or if the condition fails to improve as anticipated.  I provided >15 minutes of non-face-to-face time during this encounter that included discussing at length patient's sx/history,  pertinent pmhx, medications, treatment and follow up plan. This time also included the necessary documentation, orders, and chart review.  Farida Mcreynolds D  Dorris Fetch, MD

## 2019-11-09 ENCOUNTER — Other Ambulatory Visit: Payer: Self-pay | Admitting: Gastroenterology

## 2019-11-17 ENCOUNTER — Telehealth (INDEPENDENT_AMBULATORY_CARE_PROVIDER_SITE_OTHER): Payer: BC Managed Care – PPO | Admitting: Family Medicine

## 2019-11-17 ENCOUNTER — Other Ambulatory Visit: Payer: Self-pay

## 2019-11-17 ENCOUNTER — Encounter: Payer: Self-pay | Admitting: Family Medicine

## 2019-11-17 DIAGNOSIS — B9689 Other specified bacterial agents as the cause of diseases classified elsewhere: Secondary | ICD-10-CM | POA: Diagnosis not present

## 2019-11-17 DIAGNOSIS — R05 Cough: Secondary | ICD-10-CM

## 2019-11-17 DIAGNOSIS — J329 Chronic sinusitis, unspecified: Secondary | ICD-10-CM | POA: Diagnosis not present

## 2019-11-17 DIAGNOSIS — R058 Other specified cough: Secondary | ICD-10-CM

## 2019-11-17 MED ORDER — AMOXICILLIN-POT CLAVULANATE 875-125 MG PO TABS: 1.0000 | ORAL_TABLET | Freq: Two times a day (BID) | ORAL | 0 refills | Status: DC

## 2019-11-17 MED ORDER — HYDROCOD POLST-CPM POLST ER 10-8 MG/5ML PO SUER: 5.0000 mL | Freq: Two times a day (BID) | ORAL | 0 refills | Status: DC | PRN

## 2019-11-17 MED ORDER — TRELEGY ELLIPTA 100-62.5-25 MCG/INH IN AEPB: 1.0000 | INHALATION_SPRAY | Freq: Every day | RESPIRATORY_TRACT | 0 refills | Status: DC

## 2019-11-17 NOTE — Progress Notes (Signed)
Name: Regina Christensen   MRN: [id number]    DOB: October 29, 1955   Date:11/17/2019       Progress Note  Subjective  Chief Complaint  Chief Complaint  Patient presents with  . Sinusitis    Had sinus drainage last weekend. She treated it with the Tussionex.   . Cough    Cough has been on and off since June. She did a virtual visit with [employer]. Symptoms have not resolved. Cough is productive went from being clear to some brownish color and thicker than usual.    I connected with  [name] on 11/17/19 at  9:00 AM EDT by telephone and verified that I am speaking with the correct person using two identifiers.  I discussed the limitations, risks, security and privacy concerns of performing an evaluation and management service by telephone and the availability of in person appointments. Staff also discussed with the patient that there may be a patient responsible charge related to this service. Patient Location: at home  Provider Location: [facility]   HPI  Productive cough: she was seen end of June with sinus pressure, she followed up with Dr. Dorris Fetch on July 15 th. She has been taking cough medication but is not getting better, the rhinorrhea has gone from clear to mucus like. Cough is mostly dry but at times productive and is brown in color. No SOB, fever or chills, she has intermittent wheezing. She has noticed a foul smell in her nose, also pain during palpation of right maxillary sinus   Patient Active Problem List   Diagnosis Date Noted  . Leukocytosis 07/19/2019  . History of hysterectomy 07/19/2019  . Drowsy 07/19/2019  . Anxiety 07/19/2019  . MDD (major depressive disorder), recurrent, in full remission (HCC) 07/19/2019  . Insomnia 07/19/2019  . History of total knee arthroplasty 06/06/2019  . A-fib (HCC) 02/08/2019  . Atrial fibrillation with rapid ventricular response (HCC) 02/07/2019  . Atrial fibrillation, new onset (HCC) 02/03/2019  . Bilateral carpal tunnel  syndrome 07/30/2016  . OSA (obstructive sleep apnea) 03/07/2016  . MDD (major depressive disorder), recurrent episode, mild (HCC) 11/30/2015  . Osteoarthritis of both knees 11/30/2015  . BPPV (benign paroxysmal positional vertigo) 08/28/2015  . Essential hypertension 11/09/2014  . Hyperglycemia 11/09/2014  . GAD (generalized anxiety disorder) 11/09/2014  . Acanthosis nigricans 11/09/2014  . Morbid obesity (HCC) 01/06/2012    Past Surgical History:  Procedure Laterality Date  . ABDOMINAL HYSTERECTOMY    . BREAST EXCISIONAL BIOPSY Left   . ENDOMETRIAL ABLATION     x2  . GASTRIC BYPASS     sleeve    Family History  Problem Relation Age of Onset  . Heart disease Father   . Cancer Brother   . Mental illness Neg Hx      Current Outpatient Medications:  .  acetaminophen (TYLENOL 8 HOUR ARTHRITIS PAIN) 650 MG CR tablet, Take 1 tablet (650 mg total) by mouth every 8 (eight) hours as needed for pain. (Patient taking differently: Take 1,300 mg by mouth every 8 (eight) hours as needed for pain. ), Disp: 90 tablet, Rfl: 0 .  amLODipine (NORVASC) 2.5 MG tablet, TAKE 1 TABLET BY MOUTH EVERY DAY (Patient taking differently: 5 mg. ), Disp: 90 tablet, Rfl: 0 .  apixaban (ELIQUIS) 5 MG TABS tablet, Take 1 tablet (5 mg total) by mouth 2 (two) times daily., Disp: 180 tablet, Rfl: 2 .  azelastine (ASTELIN) 0.1 % nasal spray, PLACE 2 SPRAYS INTO BOTH NOSTRILS 2 (  TWO) TIMES DAILY, Disp: 30 mL, Rfl: 1 .  cloNIDine (CATAPRES - DOSED IN MG/24 HR) 0.1 mg/24hr patch, One weekly, Disp: 12 patch, Rfl: 1 .  famotidine (PEPCID) 20 MG tablet, TAKE 1 TABLET BY MOUTH EVERY DAY, Disp: 90 tablet, Rfl: 0 .  fexofenadine (ALLEGRA) 180 MG tablet, Take 180 mg by mouth daily., Disp: , Rfl:  .  HYDROcodone-acetaminophen (NORCO/VICODIN) 5-325 MG tablet, Takes as needed for knee, off and on, Disp: , Rfl:  .  metoprolol tartrate (LOPRESSOR) 100 MG tablet, Take 1 tablet (100 mg total) by mouth 2 (two) times daily., Disp: 180  tablet, Rfl: 3 .  Semaglutide, 1 MG/DOSE, (OZEMPIC, 1 MG/DOSE,) 2 MG/1.5ML SOPN, Inject 1 mg into the skin once a week., Disp: 9 mL, Rfl: 1 .  venlafaxine XR (EFFEXOR-XR) 150 MG 24 hr capsule, TAKE 1 CAPSULE  DAILY WITH BREAKFAST, Disp: 90 capsule, Rfl: 0 .  XIIDRA 5 % SOLN, Place 1 drop into both eyes 2 (two) times daily., Disp: , Rfl: 4  Allergies  Allergen Reactions  . Ace Inhibitors Cough  . Hctz [Hydrochlorothiazide] Rash    face    I personally reviewed active problem list, medication list, allergies, family history, social history with the patient/caregiver today.   ROS  Ten systems reviewed and is negative except as mentioned in HPI  She has joint pains and swelling   Objective  Virtual encounter, vitals not obtained.  There is no height or weight on file to calculate BMI.  Physical Exam  Awake, alert and oriented   PHQ2/9: Depression screen [facility] 2/9 11/17/2019 11/03/2019 10/19/2019 08/26/2019 05/13/2019  Decreased Interest 0 0 0 0 0  Down, Depressed, Hopeless 0 0 0 0 0  PHQ - 2 Score 0 0 0 0 0  Altered sleeping 0 0 0 1 0  Tired, decreased energy 0 0 0 1 1  Change in appetite 0 0 0 0 0  Feeling bad or failure about yourself  0 0 0 0 0  Trouble concentrating 0 0 0 0 0  Moving slowly or fidgety/restless 0 0 0 0 0  Suicidal thoughts 0 0 0 0 0  PHQ-9 Score 0 0 0 2 1  Difficult doing work/chores - Not difficult at all - Not difficult at all Not difficult at all  Some recent data might be hidden   PHQ-2/9 Result is negative.    Fall Risk: Fall Risk  11/17/2019 11/03/2019 10/19/2019 08/26/2019 05/13/2019  Falls in the past year? 0 0 0 0 0  Number falls in past yr: 0 0 0 0 0  Injury with Fall? 0 0 0 0 0  Risk for fall due to : - - - - -  Follow up - - - - -     Assessment & Plan  1. Productive cough  - Fluticasone-Umeclidin-Vilant (TRELEGY ELLIPTA) 100-62.5-25 MCG/INH AEPB; Inhale 1 Inhaler into the lungs daily.  Dispense: 60 each; Refill: 0 - amoxicillin-clavulanate  (AUGMENTIN) 875-125 MG tablet; Take 1 tablet by mouth 2 (two) times daily.  Dispense: 28 tablet; Refill: 0 - chlorpheniramine-HYDROcodone (TUSSIONEX PENNKINETIC ER) 10-8 MG/5ML SUER; Take 5 mLs by mouth every 12 (twelve) hours as needed.  Dispense: 140 mL; Refill: 0  2. Sinusitis, bacterial  - amoxicillin-clavulanate (AUGMENTIN) 875-125 MG tablet; Take 1 tablet by mouth 2 (two) times daily.  Dispense: 28 tablet; Refill: 0  I discussed the assessment and treatment plan with the patient. The patient was provided an opportunity to ask questions and all were  answered. The patient agreed with the plan and demonstrated an understanding of the instructions.   The patient was advised to call back or seek an in-person evaluation if the symptoms worsen or if the condition fails to improve as anticipated.  I provided 15 minutes of non-face-to-face time during this encounter.  Ruel Favors, MD

## 2019-11-29 ENCOUNTER — Encounter: Payer: Self-pay | Admitting: Family Medicine

## 2019-11-29 ENCOUNTER — Ambulatory Visit (INDEPENDENT_AMBULATORY_CARE_PROVIDER_SITE_OTHER): Payer: BC Managed Care – PPO | Admitting: Family Medicine

## 2019-11-29 ENCOUNTER — Other Ambulatory Visit: Payer: Self-pay

## 2019-11-29 VITALS — BP 140/80 | HR 90 | Temp 98.7°F | Resp 16 | Ht 68.0 in | Wt 289.7 lb

## 2019-11-29 DIAGNOSIS — I10 Essential (primary) hypertension: Secondary | ICD-10-CM | POA: Diagnosis not present

## 2019-11-29 DIAGNOSIS — H8110 Benign paroxysmal vertigo, unspecified ear: Secondary | ICD-10-CM

## 2019-11-29 DIAGNOSIS — E8881 Metabolic syndrome: Secondary | ICD-10-CM | POA: Diagnosis not present

## 2019-11-29 DIAGNOSIS — F325 Major depressive disorder, single episode, in full remission: Secondary | ICD-10-CM | POA: Diagnosis not present

## 2019-11-29 DIAGNOSIS — K219 Gastro-esophageal reflux disease without esophagitis: Secondary | ICD-10-CM

## 2019-11-29 DIAGNOSIS — G4733 Obstructive sleep apnea (adult) (pediatric): Secondary | ICD-10-CM

## 2019-11-29 DIAGNOSIS — I4891 Unspecified atrial fibrillation: Secondary | ICD-10-CM

## 2019-11-29 DIAGNOSIS — J329 Chronic sinusitis, unspecified: Secondary | ICD-10-CM

## 2019-11-29 DIAGNOSIS — F411 Generalized anxiety disorder: Secondary | ICD-10-CM

## 2019-11-29 DIAGNOSIS — R519 Headache, unspecified: Secondary | ICD-10-CM

## 2019-11-29 MED ORDER — MECLIZINE HCL 12.5 MG PO TABS: 12.5000 mg | ORAL_TABLET | Freq: Three times a day (TID) | ORAL | 0 refills | Status: DC | PRN

## 2019-11-29 NOTE — Progress Notes (Signed)
Name: Regina Christensen   MRN: [id number]    DOB: Sep 11, 1955   Date:11/29/2019       Progress Note  Subjective  Chief Complaint  Chief Complaint  Patient presents with  . Dizziness  . Headache    HPI  Vertigo: she was diagnosed with BPPV previously had Epley maneuver done at [facility] in the past but was doing fine until last night. She woke up at 1 am and when she rolled and sat on the side of her bed she developed spinning sensation and had to lay down again, she felt nauseated but no vomiting. She had to stay still for a long time until she could get up. She states about one hour after the vertigo she developed a dull headache, mild,  5/10 in intensity, she did not take any medications for it.   Recurrent sinusitis: she was given antibiotics, continues to have worsening of allergy symptoms and a foul odor intermittently from right maxillary  sinus No fever or chills, we will refer her to ENT  Afib: she states since she has been on higher dose of Metoprolol at 100 mg BIDshe states episodes of palpitation  are seldom, but early this morning had some symptoms during episode of vertigo. She denies association with sob or chest pain. She has been taking Eliquis and denies bleeding. We will recheck CBC today   MDD/GAD: she is seeing Dr. Elna Breslow and also therapist. 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 She is compliant with medication and is taking down again to Effexor 150  mg daily. Phq 9 is normal . Lives with daughter, two grand-daughter and son in Social [place]. She feels supported by her family    Insomnia: she states Dr. Elna Breslow, she is only on Melatonin and doing better   HTN: she is only on lopressor and clonidine patch, we added norvasc 2.5 mg on her last visit, today bp is normal , she denies chest pain .   Morbid obesity: she states difficulty exercising because of OA , had right knee surgery but still has pain on left side. She lost 6 lbs since  last visit, she has been eating smaller portions.   OA knees: had surgery right knee, TKR and will go for left knee replacement before the end of this year. Doing better with right side, still has some pain going up and down stairs, left knee is weaker and has grinding sensation, she will see Ortho - Dr. Odis Luster   Trigeminal neuralgia. She used to take tegretol in the past and needs a refill, pain is shooting and had been gone for years but had some pain end of December, no flares in months. She stopped lyrica on her own, she is doing well at this time  Pre-diabetes: she is on Ozempic, last A1C was 6.3 %, she denies polyphagia, polyuria or polydipsia  We will recheck labs    Patient Active Problem List   Diagnosis Date Noted  . Leukocytosis 07/19/2019  . History of hysterectomy 07/19/2019  . Drowsy 07/19/2019  . Anxiety 07/19/2019  . MDD (major depressive disorder), recurrent, in full remission (HCC) 07/19/2019  . Insomnia 07/19/2019  . History of total knee arthroplasty 06/06/2019  . A-fib (HCC) 02/08/2019  . Atrial fibrillation with rapid ventricular response (HCC) 02/07/2019  . Atrial fibrillation, new onset (HCC) 02/03/2019  . Bilateral carpal tunnel syndrome 07/30/2016  . OSA (obstructive sleep apnea) 03/07/2016  . MDD (major depressive disorder), recurrent  episode, mild (HCC) 11/30/2015  . Osteoarthritis of both knees 11/30/2015  . BPPV (benign paroxysmal positional vertigo) 08/28/2015  . Essential hypertension 11/09/2014  . Hyperglycemia 11/09/2014  . GAD (generalized anxiety disorder) 11/09/2014  . Acanthosis nigricans 11/09/2014  . Morbid obesity (HCC) 01/06/2012    Past Surgical History:  Procedure Laterality Date  . ABDOMINAL HYSTERECTOMY    . BREAST EXCISIONAL BIOPSY Left   . ENDOMETRIAL ABLATION     x2  . GASTRIC BYPASS     sleeve    Family History  Problem Relation Age of Onset  . Heart disease Father   . Cancer Brother   . Mental illness Neg Hx      Social History   Tobacco Use  . Smoking status: Never Smoker  . Smokeless tobacco: Never Used  Substance Use Topics  . Alcohol use: No    Alcohol/week: 0.0 standard drinks    Comment: rare     Current Outpatient Medications:  .  acetaminophen (TYLENOL 8 HOUR ARTHRITIS PAIN) 650 MG CR tablet, Take 1 tablet (650 mg total) by mouth every 8 (eight) hours as needed for pain. (Patient taking differently: Take 1,300 mg by mouth every 8 (eight) hours as needed for pain. ), Disp: 90 tablet, Rfl: 0 .  amLODipine (NORVASC) 2.5 MG tablet, TAKE 1 TABLET BY MOUTH EVERY DAY (Patient taking differently: 5 mg. ), Disp: 90 tablet, Rfl: 0 .  amoxicillin-clavulanate (AUGMENTIN) 875-125 MG tablet, Take 1 tablet by mouth 2 (two) times daily., Disp: 28 tablet, Rfl: 0 .  apixaban (ELIQUIS) 5 MG TABS tablet, Take 1 tablet (5 mg total) by mouth 2 (two) times daily., Disp: 180 tablet, Rfl: 2 .  azelastine (ASTELIN) 0.1 % nasal spray, PLACE 2 SPRAYS INTO BOTH NOSTRILS 2 (TWO) TIMES DAILY, Disp: 30 mL, Rfl: 1 .  cloNIDine (CATAPRES - DOSED IN MG/24 HR) 0.1 mg/24hr patch, One weekly, Disp: 12 patch, Rfl: 1 .  famotidine (PEPCID) 20 MG tablet, TAKE 1 TABLET BY MOUTH EVERY DAY, Disp: 90 tablet, Rfl: 0 .  fexofenadine (ALLEGRA) 180 MG tablet, Take 180 mg by mouth daily., Disp: , Rfl:  .  Fluticasone-Umeclidin-Vilant (TRELEGY ELLIPTA) 100-62.5-25 MCG/INH AEPB, Inhale 1 Inhaler into the lungs daily., Disp: 60 each, Rfl: 0 .  HYDROcodone-acetaminophen (NORCO/VICODIN) 5-325 MG tablet, Takes as needed for knee, off and on, Disp: , Rfl:  .  metoprolol tartrate (LOPRESSOR) 100 MG tablet, Take 1 tablet (100 mg total) by mouth 2 (two) times daily., Disp: 180 tablet, Rfl: 3 .  Semaglutide, 1 MG/DOSE, (OZEMPIC, 1 MG/DOSE,) 2 MG/1.5ML SOPN, Inject 1 mg into the skin once a week., Disp: 9 mL, Rfl: 1 .  venlafaxine XR (EFFEXOR-XR) 150 MG 24 hr capsule, TAKE 1 CAPSULE  DAILY WITH BREAKFAST, Disp: 90 capsule, Rfl: 0 .  XIIDRA 5 %  SOLN, Place 1 drop into both eyes 2 (two) times daily., Disp: , Rfl: 4  Allergies  Allergen Reactions  . Ace Inhibitors Cough  . Hctz [Hydrochlorothiazide] Rash    face    I personally reviewed active problem list, medication list, allergies, family history, social history, health maintenance with the patient/caregiver today.   ROS  Constitutional: Negative for fever or weight change.  Respiratory: Negative for cough and shortness of breath.   Cardiovascular: Negative for chest pain or palpitations.  Gastrointestinal: Negative for abdominal pain, no bowel changes.  Musculoskeletal: Negative for gait problem or joint swelling.  Skin: Negative for rash.  Neurological: Negative for dizziness or headache.  No  other specific complaints in a complete review of systems (except as listed in HPI above).  Objective  Vitals:   11/29/19 1421  BP: 140/80  Pulse: 90  Resp: 16  Temp: 98.7 F (37.1 C)  TempSrc: Oral  SpO2: 99%  Weight: 289 lb 11.2 oz (131.4 kg)  Height: 5\' 8"  (1.727 m)    Body mass index is 44.05 kg/m.  Physical Exam  Constitutional: Patient appears well-developed and well-nourished. Obese  No distress.  HEENT: head atraumatic, normocephalic, pupils equal and reactive to light, ears normal TM, neck supple, no tenderness during palpation of sinus  Cardiovascular: Normal rate, regular rhythm and normal heart sounds.  No murmur heard. No BLE edema. Pulmonary/Chest: Effort normal and breath sounds normal. No respiratory distress. Abdominal: Soft.  There is no tenderness. Neurological: no nystagmus , normal cranial nerves  Psychiatric: Patient has a normal mood and affect. behavior is normal. Judgment and thought content normal.  PHQ2/9: Depression screen [facility] 2/9 11/29/2019 11/17/2019 11/03/2019 10/19/2019 08/26/2019  Decreased Interest 0 0 0 0 0  Down, Depressed, Hopeless 0 0 0 0 0  PHQ - 2 Score 0 0 0 0 0  Altered sleeping 0 0 0 0 1  Tired, decreased energy 0 0 0 0 1   Change in appetite 0 0 0 0 0  Feeling bad or failure about yourself  0 0 0 0 0  Trouble concentrating 0 0 0 0 0  Moving slowly or fidgety/restless 0 0 0 0 0  Suicidal thoughts 0 0 0 0 0  PHQ-9 Score 0 0 0 0 2  Difficult doing work/chores - - Not difficult at all - Not difficult at all  Some recent data might be hidden    phq 9 is negative   Fall Risk: Fall Risk  11/29/2019 11/17/2019 11/03/2019 10/19/2019 08/26/2019  Falls in the past year? 0 0 0 0 0  Number falls in past yr: 0 0 0 0 0  Injury with Fall? 0 0 0 0 0  Risk for fall due to : - - - - -  Follow up - - - - -     Assessment & Plan  1. Atrial fibrillation with rapid ventricular response (HCC)  Rate controlled   2. Essential hypertension  - COMPLETE METABOLIC PANEL WITH GFR - CBC with Differential/Platelet - TSH  3. Metabolic syndrome  - Lipid panel - Hemoglobin A1c  4. Major depression in remission [facility])  Continue follow up with [facility]. [facility]   5. GAD (generalized anxiety disorder)   6. Morbid obesity (HCC)  Discussed with the patient the risk posed by an increased BMI. Discussed importance of portion control, calorie counting and at least 150 minutes of physical activity weekly. Avoid sweet beverages and drink more water. Eat at least 6 servings of fruit and vegetables daily   7. OSA (obstructive sleep apnea)  - CBC with Differential/Platelet  8. GERD without esophagitis   9. Benign paroxysmal positional vertigo, unspecified laterality  - meclizine (ANTIVERT) 12.5 MG tablet; Take 1 tablet (12.5 mg total) by mouth 3 (three) times daily as needed for dizziness.  Dispense: 30 tablet; Refill: 0 - Ambulatory referral to ENT  10. Acute nonintractable headache, unspecified headache type  Try tylenol , normal neuro exam  11. Recurrent sinusitis  Use saline spray avoid sudafe  - Ambulatory referral to ENT

## 2019-11-29 NOTE — Patient Instructions (Signed)
How to Perform the Epley Maneuver °The Epley maneuver is an exercise that relieves symptoms of vertigo. Vertigo is the feeling that you or your surroundings are moving when they are not. When you feel vertigo, you may feel like the room is spinning and have trouble walking. Dizziness is a little different than vertigo. When you are dizzy, you may feel unsteady or light-headed. °You can do this maneuver at home whenever you have symptoms of vertigo. You can do it up to 3 times a day until your symptoms go away. °Even though the Epley maneuver may relieve your vertigo for a few weeks, it is possible that your symptoms will return. This maneuver relieves vertigo, but it does not relieve dizziness. °What are the risks? °If it is done correctly, the Epley maneuver is considered safe. Sometimes it can lead to dizziness or nausea that goes away after a short time. If you develop other symptoms, such as changes in vision, weakness, or numbness, stop doing the maneuver and call your health care provider. °How to perform the Epley maneuver °1. Sit on the edge of a bed or table with your back straight and your legs extended or hanging over the edge of the bed or table. °2. Turn your head halfway toward the affected ear or side. °3. Lie backward quickly with your head turned until you are lying flat on your back. You may want to position a pillow under your shoulders. °4. Hold this position for 30 seconds. You may experience an attack of vertigo. This is normal. °5. Turn your head to the opposite direction until your unaffected ear is facing the floor. °6. Hold this position for 30 seconds. You may experience an attack of vertigo. This is normal. Hold this position until the vertigo stops. °7. Turn your whole body to the same side as your head. Hold for another 30 seconds. °8. Sit back up. °You can repeat this exercise up to 3 times a day. °Follow these instructions at home: °· After doing the Epley maneuver, you can return to  your normal activities. °· Ask your health care provider if there is anything you should do at home to prevent vertigo. He or she may recommend that you: °? Keep your head raised (elevated) with two or more pillows while you sleep. °? Do not sleep on the side of your affected ear. °? Get up slowly from bed. °? Avoid sudden movements during the day. °? Avoid extreme head movement, like looking up or bending over. °Contact a health care provider if: °· Your vertigo gets worse. °· You have other symptoms, including: °? Nausea. °? Vomiting. °? Headache. °Get help right away if: °· You have vision changes. °· You have a severe or worsening headache or neck pain. °· You cannot stop vomiting. °· You have new numbness or weakness in any part of your body. °Summary °· Vertigo is the feeling that you or your surroundings are moving when they are not. °· The Epley maneuver is an exercise that relieves symptoms of vertigo. °· If the Epley maneuver is done correctly, it is considered safe. You can do it up to 3 times a day. °This information is not intended to replace advice given to you by your health care provider. Make sure you discuss any questions you have with your health care provider. °Document Revised: 03/20/2017 Document Reviewed: 02/26/2016 °Elsevier Patient Education © 2020 [employer] Inc[employer] °Benign Positional Vertigo °Vertigo is the feeling that you or your surroundings are moving when   they are not. Benign positional vertigo is the most common form of vertigo. This is usually a harmless condition (benign). This condition is positional. This means that symptoms are triggered by certain movements and positions. °This condition can be dangerous if it occurs while you are doing something that could cause harm to you or others. This includes activities such as driving or operating machinery. °What are the causes? °In many cases, the cause of this condition is not known. It may be caused by a disturbance in an area of the  inner ear that helps your brain to sense movement and balance. This disturbance can be caused by: °· Viral infection (labyrinthitis). °· Head injury. °· Repetitive motion, such as jumping, dancing, or running. °What increases the risk? °You are more likely to develop this condition if: °· You are a woman. °· You are 50 years of age or older. °What are the signs or symptoms? °Symptoms of this condition usually happen when you move your head or your eyes in different directions. Symptoms may start suddenly, and usually last for less than a minute. They include: °· Loss of balance and falling. °· Feeling like you are spinning or moving. °· Feeling like your surroundings are spinning or moving. °· Nausea and vomiting. °· Blurred vision. °· Dizziness. °· Involuntary eye movement (nystagmus). °Symptoms can be mild and cause only minor problems, or they can be severe and interfere with daily life. Episodes of benign positional vertigo may return (recur) over time. Symptoms may improve over time. °How is this diagnosed? °This condition may be diagnosed based on: °· Your medical history. °· Physical exam of the head, neck, and ears. °· Tests, such as: °? MRI. °? CT scan. °? Eye movement tests. Your health care provider may ask you to change positions quickly while he or she watches you for symptoms of benign positional vertigo, such as nystagmus. Eye movement may be tested with a variety of exams that are designed to evaluate or stimulate vertigo. °? An electroencephalogram (EEG). This records electrical activity in your brain. °? Hearing tests. °You may be referred to a health care provider who specializes in ear, nose, and throat (ENT) problems (otolaryngologist) or a provider who specializes in disorders of the nervous system (neurologist). °How is this treated? ° °This condition may be treated in a session in which your health care provider moves your head in specific positions to adjust your inner ear back to normal.  Treatment for this condition may take several sessions. Surgery may be needed in severe cases, but this is rare.  °In some cases, benign positional vertigo may resolve on its own in 2-4 weeks. °Follow these instructions at home: °Safety °· Move slowly. Avoid sudden body or head movements or certain positions, as told by your health care provider. °· Avoid driving until your health care provider says it is safe for you to do so. °· Avoid operating heavy machinery until your health care provider says it is safe for you to do so. °· Avoid doing any tasks that would be dangerous to you or others if vertigo occurs. °· If you have trouble walking or keeping your balance, try using a cane for stability. If you feel dizzy or unstable, sit down right away. °· Return to your normal activities as told by your health care provider. Ask your health care provider what activities are safe for you. °General instructions °· Take over-the-counter and prescription medicines only as told by your health care provider. °· Drink enough   fluid to keep your urine pale yellow. °· Keep all follow-up visits as told by your health care provider. This is important. °Contact a health care provider if: °· You have a fever. °· Your condition gets worse or you develop new symptoms. °· Your family or friends notice any behavioral changes. °· You have nausea or vomiting that gets worse. °· You have numbness or a "pins and needles" sensation. °Get help right away if you: °· Have difficulty speaking or moving. °· Are always dizzy. °· Faint. °· Develop severe headaches. °· Have weakness in your legs or arms. °· Have changes in your hearing or vision. °· Develop a stiff neck. °· Develop sensitivity to light. °Summary °· Vertigo is the feeling that you or your surroundings are moving when they are not. Benign positional vertigo is the most common form of vertigo. °· The cause of this condition is not known. It may be caused by a disturbance in an area of  the inner ear that helps your brain to sense movement and balance. °· Symptoms include loss of balance and falling, feeling that you or your surroundings are moving, nausea and vomiting, and blurred vision. °· This condition can be diagnosed based on symptoms, physical exam, and other tests, such as MRI, CT scan, eye movement tests, and hearing tests. °· Follow safety instructions as told by your health care provider. You will also be told when to contact your health care provider in case of problems. °This information is not intended to replace advice given to you by your health care provider. Make sure you discuss any questions you have with your health care provider. °Document Revised: 09/16/2017 Document Reviewed: 09/16/2017 °Elsevier Patient Education © 2020 Elsevier Inc. ° °

## 2019-11-30 ENCOUNTER — Telehealth: Payer: Self-pay | Admitting: Family Medicine

## 2019-11-30 LAB — COMPLETE METABOLIC PANEL WITH GFR
AG Ratio: 1.3 (calc) (ref 1.0–2.5)
ALT: 9 U/L (ref 6–29)
AST: 10 U/L (ref 10–35)
Albumin: 3.9 g/dL (ref 3.6–5.1)
Alkaline phosphatase (APISO): 95 U/L (ref 37–153)
BUN: 9 mg/dL (ref 7–25)
CO2: 29 mmol/L (ref 20–32)
Calcium: 9.3 mg/dL (ref 8.6–10.4)
Chloride: 102 mmol/L (ref 98–110)
Creat: 0.76 mg/dL (ref 0.50–0.99)
GFR, Est African American: 97 mL/min/{1.73_m2} (ref >=60)
GFR, Est Non African American: 83 mL/min/{1.73_m2} (ref >=60)
Globulin: 3.1 g/dL (calc) (ref 1.9–3.7)
Glucose, Bld: 83 mg/dL (ref 65–99)
Potassium: 3.7 mmol/L (ref 3.5–5.3)
Sodium: 140 mmol/L (ref 135–146)
Total Bilirubin: 0.4 mg/dL (ref 0.2–1.2)
Total Protein: 7 g/dL (ref 6.1–8.1)

## 2019-11-30 LAB — TSH: TSH: 1.05 mIU/L (ref 0.40–4.50)

## 2019-11-30 LAB — CBC WITH DIFFERENTIAL/PLATELET
Absolute Monocytes: 570 cells/uL (ref 200–950)
Basophils Absolute: 50 cells/uL (ref 0–200)
Basophils Relative: 0.5 %
Eosinophils Absolute: 150 cells/uL (ref 15–500)
Eosinophils Relative: 1.5 %
HCT: 41.4 % (ref 35.0–45.0)
Hemoglobin: 13.8 g/dL (ref 11.7–15.5)
Lymphs Abs: 2130 cells/uL (ref 850–3900)
MCH: 29 pg (ref 27.0–33.0)
MCHC: 33.3 g/dL (ref 32.0–36.0)
MCV: 87 fL (ref 80.0–100.0)
MPV: 11.2 fL (ref 7.5–12.5)
Monocytes Relative: 5.7 %
Neutro Abs: 7100 cells/uL (ref 1500–7800)
Neutrophils Relative %: 71 %
Platelets: 344 10*3/uL (ref 140–400)
RBC: 4.76 10*6/uL (ref 3.80–5.10)
RDW: 15.7 % — ABNORMAL HIGH (ref 11.0–15.0)
Total Lymphocyte: 21.3 %
WBC: 10 10*3/uL (ref 3.8–10.8)

## 2019-11-30 LAB — HEMOGLOBIN A1C
Hgb A1c MFr Bld: 6.3 % of total Hgb — ABNORMAL HIGH (ref <5.7)
Mean Plasma Glucose: 134 (calc)
eAG (mmol/L): 7.4 (calc)

## 2019-11-30 LAB — LIPID PANEL
Cholesterol: 141 mg/dL (ref <200)
HDL: 73 mg/dL (ref >=50)
LDL Cholesterol (Calc): 50 mg/dL (calc)
Non-HDL Cholesterol (Calc): 68 mg/dL (calc) (ref <130)
Total CHOL/HDL Ratio: 1.9 (calc) (ref <5.0)
Triglycerides: 98 mg/dL (ref <150)

## 2019-11-30 NOTE — Telephone Encounter (Signed)
Work Note has been sent to pt. Mychart.

## 2019-11-30 NOTE — Telephone Encounter (Signed)
Pt was seen yesterday ( 11-29-2019) and she forgot to geta work note saying that she was here. Just call  Her at [phone]  when ready

## 2019-12-12 ENCOUNTER — Telehealth: Payer: Self-pay | Admitting: Cardiology

## 2019-12-12 NOTE — Telephone Encounter (Signed)
   [facility] Pre-operative Risk Assessment    HEARTCARE STAFF: - Please ensure there is not already an duplicate clearance open for this procedure. - Under Visit Info/Reason for Call, type in Other and utilize the format Clearance MM/DD/YY or Clearance TBD. Do not use dashes or single digits. - If request is for dental extraction, please clarify the # of teeth to be extracted.  Request for surgical clearance:  1. What type of surgery is being performed? Lt TKA traditional knee    2. When is this surgery scheduled?  03/08/20   3. What type of clearance is required (medical clearance vs. Pharmacy clearance to hold med vs. Both)? both  4. Are there any medications that need to be held prior to surgery and how long?please advise   5. Practice name and name of physician performing surgery?  [facility] Dr Harlow Mares    6. What is the office phone number? [phone] est [phone]    7.   What is the office fax number?  [phone]  8.   Anesthesia type (None, local, MAC, general) ? Spinal / local   Clarisse Gouge 12/12/2019, 11:10 AM  _________________________________________________________________   (provider comments below)

## 2019-12-12 NOTE — Telephone Encounter (Signed)
Surgery is scheduled for 03/08/2020-LOV was April 2021 and 12 month f/u recommended then.  She will need to be contacted mid October for clearance.  Corine Shelter PA-C 12/12/2019 12:06 PM

## 2019-12-16 ENCOUNTER — Telehealth: Payer: Self-pay | Admitting: Cardiology

## 2019-12-16 NOTE — Telephone Encounter (Signed)
Regina Christensen is on Eliquis for PAF. Please make recommendations for holding prior to dental procedure.

## 2019-12-16 NOTE — Telephone Encounter (Signed)
   [facility] Pre-operative Risk Assessment    HEARTCARE STAFF: - Please ensure there is not already an duplicate clearance open for this procedure. - Under Visit Info/Reason for Call, type in Other and utilize the format Clearance MM/DD/YY or Clearance TBD. Do not use dashes or single digits. - If request is for Christensen extraction, please clarify the # of teeth to be extracted.  Request for surgical clearance:  1. What type of surgery is being performed? EXTRACTION 1 TOOTH  When is this surgery scheduled? 12/19/19  2. What type of clearance is required (medical clearance vs. Pharmacy clearance to hold med vs. Both)? PHARMACY  3. Are there any medications that need to be held prior to surgery and how long? [place] name and name of physician performing surgery? Regina Christensen/ Regina Christensen   7.   What is the office fax number? Regina Christensen [phone]  8.   Anesthesia type (None, local, MAC, general) ? LOCAL   Regina Christensen 12/16/2019, 11:41 AM  _________________________________________________________________   (provider comments below)

## 2019-12-16 NOTE — Telephone Encounter (Signed)
   Primary Cardiologist: Debbe Odea, MD  Chart reviewed as part of pre-operative protocol coverage. Simple dental extractions are considered low risk procedures per guidelines and generally do not require any specific cardiac clearance. It is also generally accepted that for simple extractions and dental cleanings, there is no need to interrupt blood thinner therapy.   SBE prophylaxis is not required for the patient.  I will route this recommendation to the requesting party via Epic fax function and remove from pre-op pool.  Please call with questions.  Joni Reining, NP 12/16/2019, 2:50 PM

## 2019-12-16 NOTE — Telephone Encounter (Signed)
   Primary Cardiologist: Debbe Odea, MD  Chart reviewed as part of pre-operative protocol coverage. Simple dental extractions are considered low risk procedures per guidelines and generally do not require any specific cardiac clearance. It is also generally accepted that for simple extractions and dental cleanings, there is no need to interrupt blood thinner therapy.   SBE prophylaxis is/is no required for the patient.  I will route this recommendation to the requesting party via Epic fax function and remove from pre-op pool.  Please call with questions.  Joni Reining, NP 12/16/2019, 2:48 PM

## 2019-12-16 NOTE — Telephone Encounter (Signed)
There is no need to hold Eliquis for extraction of 1 tooth.  If dental office requires patient to hold, she is low enough risk she can the night prior and the AM of procedure.

## 2019-12-19 NOTE — Telephone Encounter (Signed)
   Primary Cardiologist: Debbe Odea, MD  Chart reviewed as part of pre-operative protocol coverage. Patient was contacted 12/19/2019 in reference to pre-operative risk assessment for pending surgery as outlined below.  Regina Christensen was last seen on 08/16/19 by Dr. Azucena Cecil. Hx outlined of PAF, arthritis, depression, morbid obesity, HTN, sleep apnea. RCRI 0.4% indicating low risk of Cv complications. I spoke with patient who affirms she's been doing well without any new cardiac symptoms. No CP, SOB, palpitations, syncope. Therefore, based on ACC/AHA guidelines, the patient would be at acceptable risk for the planned procedure without further cardiovascular testing.   As surgery is scheduled for an upcoming date, the patient was advised that if she develops new symptoms prior to surgery to contact our office to arrange for a follow-up visit, and she verbalized understanding.  Will route to pharm team then plan on finalizing clearance when this is clarified.  Laurann Montana, PA-C 12/19/2019, 1:42 PM

## 2019-12-19 NOTE — Telephone Encounter (Signed)
   Primary Cardiologist: Debbe Odea, MD  Chart reviewed as part of pre-operative protocol coverage. Given past medical history and time since last visit, based on ACC/AHA guidelines, Regina Christensen would be at acceptable risk for the planned procedure without further cardiovascular testing.   The patient was advised that if she develops new symptoms prior to surgery to contact our office to arrange for a follow-up visit, and she verbalized understanding.  Per office protocol, patient can hold ELIQUIS for 3 days prior to procedure.    For orthopedic procedures please be sure to resume therapeutic (not prophylactic) dosing.  I will route this recommendation to the requesting party via Epic fax function and remove from pre-op pool.  Please call with questions.  Laurann Montana, PA-C 12/19/2019, 3:42 PM

## 2019-12-19 NOTE — Telephone Encounter (Signed)
Patient with diagnosis of ATRIAL FIBRILLATION on ELIQUIS for anticoagulation.    Procedure: Left TKA Date of procedure: Nov/18/2021  CHADS2-VASc score of  2 (HTN,female)  CrCl >192ml/min (Scr = 0.76) Platelet count = 344  Per office protocol, patient can hold ELIQUIS for 3 days prior to procedure.    For orthopedic procedures please be sure to resume therapeutic (not prophylactic) dosing.

## 2019-12-20 DIAGNOSIS — R519 Headache, unspecified: Secondary | ICD-10-CM | POA: Insufficient documentation

## 2019-12-30 ENCOUNTER — Ambulatory Visit: Payer: BC Managed Care – PPO | Admitting: Family Medicine

## 2020-01-20 ENCOUNTER — Other Ambulatory Visit: Payer: Self-pay

## 2020-01-20 ENCOUNTER — Telehealth (INDEPENDENT_AMBULATORY_CARE_PROVIDER_SITE_OTHER): Payer: BC Managed Care – PPO | Admitting: Psychiatry

## 2020-01-20 ENCOUNTER — Encounter: Payer: Self-pay | Admitting: Psychiatry

## 2020-01-20 DIAGNOSIS — F3342 Major depressive disorder, recurrent, in full remission: Secondary | ICD-10-CM

## 2020-01-20 DIAGNOSIS — F411 Generalized anxiety disorder: Secondary | ICD-10-CM | POA: Diagnosis not present

## 2020-01-20 DIAGNOSIS — F5105 Insomnia due to other mental disorder: Secondary | ICD-10-CM | POA: Diagnosis not present

## 2020-01-20 MED ORDER — TRAZODONE HCL 50 MG PO TABS: 25.0000 mg | ORAL_TABLET | Freq: Every evening | ORAL | 1 refills | Status: DC | PRN

## 2020-01-20 MED ORDER — VENLAFAXINE HCL ER 150 MG PO CP24: ORAL_CAPSULE | ORAL | 0 refills | Status: DC

## 2020-01-20 NOTE — Progress Notes (Signed)
Provider Location : [facility] Patient Location : Home  Participants: Patient , Provider  Virtual Visit via Video Note  I connected with Regina Christensen on 01/20/20 at  8:30 AM EDT by a video enabled telemedicine application and verified that I am speaking with the correct person using two identifiers.   I discussed the limitations of evaluation and management by telemedicine and the availability of in person appointments. The patient expressed understanding and agreed to proceed.    I discussed the assessment and treatment plan with the patient. The patient was provided an opportunity to ask questions and all were answered. The patient agreed with the plan and demonstrated an understanding of the instructions.   The patient was advised to call back or seek an in-person evaluation if the symptoms worsen or if the condition fails to improve as anticipated.   [facility] MD OP Progress Note  01/20/2020 9:00 AM Regina Christensen  MRN:  [id number]  Chief Complaint:  Chief Complaint    Follow-up     HPI: Regina Christensen is a 64 year old African-American female, retired, lives in [place], has a history of GAD, MDD, hypertension, gastric bypass, hyperlipidemia, OSA, arthritis was evaluated by telemedicine today.  Patient today reports she is currently doing well with regards to her depression and anxiety symptoms except for her sleep.  She continues to struggle with sleep.  She reports most nights she sleeps only 2 to 3 hours.  She is currently on prednisone which could also be affecting her sleep.  She uses CPAP and reports she is compliant.  She has tried trazodone in the past when she was admitted to the hospital for a short period of time.  She does not remember how she did on it.  Patient denies any suicidality, homicidality or perceptual disturbances.  She is awaiting a knee surgery in November.  She currently works 20 hours/week.  She reports she does not know if she can continue working  after surgery.  She lives with her daughter and enjoys the company of her grandchildren.  She does have good social support system  Patient denies any other concerns today.  Visit Diagnosis:    ICD-10-CM   1. GAD (generalized anxiety disorder)  F41.1 venlafaxine XR (EFFEXOR-XR) 150 MG 24 hr capsule  2. MDD (major depressive disorder), recurrent, in full remission (HCC)  F33.42   3. Insomnia due to mental condition  F51.05 traZODone (DESYREL) 50 MG tablet    Past Psychiatric History: I have reviewed past psychiatric history from my progress note on 01/27/2019.  Past trials of Prozac, Effexor.  Past Medical History:  Past Medical History:  Diagnosis Date  . Arthritis   . Depression   . Hypertension   . Morbid obesity with BMI of 45.0-49.9, adult (HCC)   . Obesity   . Sleep apnea     Past Surgical History:  Procedure Laterality Date  . ABDOMINAL HYSTERECTOMY    . BREAST EXCISIONAL BIOPSY Left   . ENDOMETRIAL ABLATION     x2  . GASTRIC BYPASS     sleeve    Family Psychiatric History: Reviewed family psychiatric history from my progress note on 01/27/2019.  Family History:  Family History  Problem Relation Age of Onset  . Heart disease Father   . Cancer Brother   . Mental illness Neg Hx     Social History: Reviewed social history from my progress note on 01/27/2019 Social History   Socioeconomic History  . Marital status: Divorced  Spouse name: Not on file  . Number of children: 2  . Years of education: Not on file  . Highest education level: Associate degree: occupational, Scientist, product/process development, or vocational program  Occupational History  . Occupation: patient Tax adviser: [employer]  Tobacco Use  . Smoking status: Never Smoker  . Smokeless tobacco: Never Used  Vaping Use  . Vaping Use: Never used  Substance and Sexual Activity  . Alcohol use: No    Alcohol/week: 0.0 standard drinks    Comment: rare  . Drug use: No  . Sexual activity: Never  Other  Topics Concern  . 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 Difficulty of Paying Living Expenses: Not on file  Food Insecurity:   . Worried About Programme researcher, broadcasting/film/video in the Last Year: Not on file  . Ran Out of Food in the Last Year: Not on file  Transportation Needs:   . Lack of Transportation (Medical): Not on file  . Lack of Transportation (Non-Medical): Not on file  Physical Activity:   . Days of Exercise per Week: Not on file  . Minutes of Exercise per Session: Not on file  Stress:   . Feeling of Stress : Not on file  Social Connections:   . Frequency of Communication with Friends and Family: Not on file  . Frequency of Social Gatherings with Friends and Family: Not on file  . Attends Religious Services: Not on file  . Active Member of Clubs or Organizations: Not on file  . Attends Banker Meetings: Not on file  . Marital Status: Not on file    Allergies:  Allergies  Allergen Reactions  . Ace Inhibitors Cough  . Hctz [Hydrochlorothiazide] Rash    face    Metabolic Disorder Labs: Lab Results  Component Value Date   HGBA1C 6.3 (H) 11/29/2019   MPG 134 11/29/2019   MPG 134.11 02/03/2019   No results found for: PROLACTIN Lab Results  Component Value Date   CHOL 141 11/29/2019   TRIG 98 11/29/2019   HDL 73 11/29/2019   CHOLHDL 1.9 11/29/2019   VLDL 28 02/08/2019   LDLCALC 50 11/29/2019   LDLCALC 30 02/08/2019   Lab Results  Component Value Date   TSH 1.05 11/29/2019   TSH 1.288 02/03/2019    Therapeutic Level Labs: No results found for: LITHIUM No results found for: VALPROATE No components found for:  CBMZ  Current Medications: Current Outpatient Medications  Medication Sig Dispense Refill  . acetaminophen (TYLENOL 8 HOUR ARTHRITIS  PAIN) 650 MG CR tablet Take 1 tablet (650 mg total) by mouth every 8 (eight) hours as needed for pain. (Patient taking differently: Take 1,300 mg by mouth every 8 (eight) hours as needed for pain. ) 90 tablet 0  . amLODipine (NORVASC) 2.5 MG tablet TAKE 1 TABLET BY MOUTH EVERY DAY (Patient taking differently: 2.5 mg. ) 90 tablet 0  . apixaban (ELIQUIS) 5 MG TABS tablet Take 1 tablet (5 mg total) by mouth 2 (two) times daily. 180 tablet 2  . azelastine (ASTELIN) 0.1 % nasal spray PLACE 2 SPRAYS INTO BOTH NOSTRILS 2 (TWO) TIMES DAILY 30 mL 1  . cloNIDine (CATAPRES - DOSED IN MG/24 HR)  0.1 mg/24hr patch One weekly 12 patch 1  . famotidine (PEPCID) 20 MG tablet TAKE 1 TABLET BY MOUTH EVERY DAY 90 tablet 0  . fexofenadine (ALLEGRA) 180 MG tablet Take 180 mg by mouth daily.    . Fluticasone-Umeclidin-Vilant (TRELEGY ELLIPTA) 100-62.5-25 MCG/INH AEPB Inhale 1 Inhaler into the lungs daily. 60 each 0  . levofloxacin (LEVAQUIN) 750 MG tablet Take 750 mg by mouth daily.    . metoprolol tartrate (LOPRESSOR) 100 MG tablet Take 1 tablet (100 mg total) by mouth 2 (two) times daily. 180 tablet 3  . predniSONE (STERAPRED UNI-PAK 48 TAB) 10 MG (48) TBPK tablet Take by mouth as directed.    . Semaglutide, 1 MG/DOSE, (OZEMPIC, 1 MG/DOSE,) 2 MG/1.5ML SOPN Inject 1 mg into the skin once a week. 9 mL 1  . venlafaxine XR (EFFEXOR-XR) 150 MG 24 hr capsule TAKE 1 CAPSULE  DAILY WITH BREAKFAST 90 capsule 0  . XIIDRA 5 % SOLN Place 1 drop into both eyes 2 (two) times daily.  4  . meclizine (ANTIVERT) 12.5 MG tablet Take 1 tablet (12.5 mg total) by mouth 3 (three) times daily as needed for dizziness. (Patient not taking: Reported on 01/20/2020) 30 tablet 0  . traZODone (DESYREL) 50 MG tablet Take 0.5-1 tablets (25-50 mg total) by mouth at bedtime as needed for sleep. 30 tablet 1   No current facility-administered medications for this visit.     Musculoskeletal: Strength & Muscle Tone: UTA Gait & Station: normal Patient  leans: N/A  Psychiatric Specialty Exam: Review of Systems  Musculoskeletal:       Knee pain  Psychiatric/Behavioral: Positive for sleep disturbance.  All other systems reviewed and are negative.   There were no vitals taken for this visit.There is no height or weight on file to calculate BMI.  General Appearance: Casual  Eye Contact:  Fair  Speech:  Clear and Coherent  Volume:  Normal  Mood:  Euthymic  Affect:  Congruent  Thought Process:  Goal Directed and Descriptions of Associations: Intact  Orientation:  Full (Time, Place, and Person)  Thought Content: Logical   Suicidal Thoughts:  No  Homicidal Thoughts:  No  Memory:  Immediate;   Fair Recent;   Fair Remote;   Fair  Judgement:  Fair  Insight:  Fair  Psychomotor Activity:  Normal  Concentration:  Concentration: Fair and Attention Span: Fair  Recall:  [employer] of Knowledge: Fair  Language: Fair  Akathisia:  No  Handed:  Right  AIMS (if indicated): UTA  Assets:  Communication Skills Desire for Improvement Housing Social Support  ADL's:  Intact  Cognition: WNL  Sleep:  Poor   Screenings: GAD-7     [facility] Visit from 01/27/2019 in [facility] Office Visit from 01/21/2019 in [facility] Office Visit from 06/21/2018 in [facility] Office Visit from 04/26/2018 in [facility] Office Visit from 02/05/2018 in [facility]  Total GAD-7 Score 10 6 0 0 0    PHQ2-9     Office Visit from 11/29/2019 in [facility] Video Visit from 11/17/2019 in [facility] Office Visit from 11/03/2019 in [facility] Office Visit from 10/19/2019 in [facility] Office Visit from 08/26/2019 in [facility]  PHQ-2 Total Score 0 0 0 0 0  PHQ-9 Total Score 0 0 0 0 2       Assessment and Plan: Regina Christensen  is a 64 year old African-American female,  divorced, retired, lives in [place], has a history of anxiety, depression, hypertension, hyperlipidemia, OSA, arthritis was evaluated by telemedicine today.  Patient is biologically predisposed given her health problems.  Patient continues to struggle with sleep issues.  Plan as noted below.  Plan GAD-stable Venlafaxine extended release 150 mg p.o. daily   MDD in remission Venlafaxine extended release 150 mg p.o. daily  Insomnia-unstable Start trazodone 25 to 50 mg p.o. nightly as needed Continue melatonin 5 mg p.o. nightly as needed. Discussed sleep hygiene techniques.  Continue CPAP.   Follow-up in clinic in 1 month or sooner if needed.  I have spent atleast 20 minutes face to face with patient today. More than 50 % of the time was spent for preparing to see the patient ( e.g., review of test, records ), ordering medications and test ,psychoeducation and supportive psychotherapy and care coordination,as well as documenting clinical information in electronic health record. This note was generated in part or whole with voice recognition software. Voice recognition is usually quite accurate but there are transcription errors that can and very often do occur. I apologize for any typographical errors that were not detected and corrected.       Jomarie LongsSaramma Layloni Fahrner, MD 01/20/2020, 9:00 AM

## 2020-01-20 NOTE — Patient Instructions (Signed)
Trazodone tablets What is this medicine? TRAZODONE (TRAZ oh done) is used to treat depression. This medicine may be used for other purposes; ask your health care provider or pharmacist if you have questions. COMMON BRAND NAME(S): Desyrel What should I tell my health care provider before I take this medicine? They need to know if you have any of these conditions:  attempted suicide or thinking about it  bipolar disorder  bleeding problems  glaucoma  heart disease, or previous heart attack  irregular heart beat  kidney or liver disease  low levels of sodium in the blood  an unusual or allergic reaction to trazodone, other medicines, foods, dyes or preservatives  pregnant or trying to get pregnant  breast-feeding How should I use this medicine? Take this medicine by mouth with a glass of water. Follow the directions on the prescription label. Take this medicine shortly after a meal or a light snack. Take your medicine at regular intervals. Do not take your medicine more often than directed. Do not stop taking this medicine suddenly except upon the advice of your doctor. Stopping this medicine too quickly may cause serious side effects or your condition may worsen. A special MedGuide will be given to you by the pharmacist with each prescription and refill. Be sure to read this information carefully each time. Talk to your pediatrician regarding the use of this medicine in children. Special care may be needed. Overdosage: If you think you have taken too much of this medicine contact a poison control center or emergency room at once. NOTE: This medicine is only for you. Do not share this medicine with others. What if I miss a dose? If you miss a dose, take it as soon as you can. If it is almost time for your next dose, take only that dose. Do not take double or extra doses. What may interact with this medicine? Do not take this medicine with any of the following  medications:  certain medicines for fungal infections like fluconazole, itraconazole, ketoconazole, posaconazole, voriconazole  cisapride  dronedarone  linezolid  MAOIs like Carbex, Eldepryl, Marplan, Nardil, and Parnate  mesoridazine  methylene blue (injected into a vein)  pimozide  saquinavir  thioridazine This medicine may also interact with the following medications:  alcohol  antiviral medicines for HIV or AIDS  aspirin and aspirin-like medicines  barbiturates like phenobarbital  certain medicines for blood pressure, heart disease, irregular heart beat  certain medicines for depression, anxiety, or psychotic disturbances  certain medicines for migraine headache like almotriptan, eletriptan, frovatriptan, naratriptan, rizatriptan, sumatriptan, zolmitriptan  certain medicines for seizures like carbamazepine and phenytoin  certain medicines for sleep  certain medicines that treat or prevent blood clots like dalteparin, enoxaparin, warfarin  digoxin  fentanyl  lithium  NSAIDS, medicines for pain and inflammation, like ibuprofen or naproxen  other medicines that prolong the QT interval (cause an abnormal heart rhythm) like dofetilide  rasagiline  supplements like St. John's wort, kava kava, valerian  tramadol  tryptophan This list may not describe all possible interactions. Give your health care provider a list of all the medicines, herbs, non-prescription drugs, or dietary supplements you use. Also tell them if you smoke, drink alcohol, or use illegal drugs. Some items may interact with your medicine. What should I watch for while using this medicine? Tell your doctor if your symptoms do not get better or if they get worse. Visit your doctor or health care professional for regular checks on your progress. Because it may take   several weeks to see the full effects of this medicine, it is important to continue your treatment as prescribed by your  doctor. Patients and their families should watch out for new or worsening thoughts of suicide or depression. Also watch out for sudden changes in feelings such as feeling anxious, agitated, panicky, irritable, hostile, aggressive, impulsive, severely restless, overly excited and hyperactive, or not being able to sleep. If this happens, especially at the beginning of treatment or after a change in dose, call your health care professional. You may get drowsy or dizzy. Do not drive, use machinery, or do anything that needs mental alertness until you know how this medicine affects you. Do not stand or sit up quickly, especially if you are an older patient. This reduces the risk of dizzy or fainting spells. Alcohol may interfere with the effect of this medicine. Avoid alcoholic drinks. This medicine may cause dry eyes and blurred vision. If you wear contact lenses you may feel some discomfort. Lubricating drops may help. See your eye doctor if the problem does not go away or is severe. Your mouth may get dry. Chewing sugarless gum, sucking hard candy and drinking plenty of water may help. Contact your doctor if the problem does not go away or is severe. What side effects may I notice from receiving this medicine? Side effects that you should report to your doctor or health care professional as soon as possible:  allergic reactions like skin rash, itching or hives, swelling of the face, lips, or tongue  elevated mood, decreased need for sleep, racing thoughts, impulsive behavior  confusion  fast, irregular heartbeat  feeling faint or lightheaded, falls  feeling agitated, angry, or irritable  loss of balance or coordination  painful or prolonged erections  restlessness, pacing, inability to keep still  suicidal thoughts or other mood changes  tremors  trouble sleeping  seizures  unusual bleeding or bruising Side effects that usually do not require medical attention (report to your doctor  or health care professional if they continue or are bothersome):  change in sex drive or performance  change in appetite or weight  constipation  headache  muscle aches or pains  nausea This list may not describe all possible side effects. Call your doctor for medical advice about side effects. You may report side effects to FDA at 1-800-FDA-1088. Where should I keep my medicine? Keep out of the reach of children. Store at room temperature between 15 and 30 degrees C (59 to 86 degrees F). Protect from light. Keep container tightly closed. Throw away any unused medicine after the expiration date. NOTE: This sheet is a summary. It may not cover all possible information. If you have questions about this medicine, talk to your doctor, pharmacist, or health care provider.  2020 Elsevier/Gold Standard (2018-03-30 11:46:46)  

## 2020-02-03 ENCOUNTER — Other Ambulatory Visit: Payer: Self-pay | Admitting: Gastroenterology

## 2020-02-04 ENCOUNTER — Other Ambulatory Visit: Payer: Self-pay | Admitting: Family Medicine

## 2020-02-04 DIAGNOSIS — I10 Essential (primary) hypertension: Secondary | ICD-10-CM

## 2020-02-04 NOTE — Telephone Encounter (Signed)
Requested Prescriptions  Pending Prescriptions Disp Refills  . amLODipine (NORVASC) 2.5 MG tablet [Pharmacy Med Name: AMLODIPINE BESYLATE 2.5 MG TAB] 90 tablet 0    Sig: TAKE 1 TABLET BY MOUTH EVERY DAY     Cardiovascular:  Calcium Channel Blockers Failed - 02/04/2020  8:41 AM      Failed - Last BP in normal range    BP Readings from Last 1 Encounters:  11/29/19 140/80         Passed - Valid encounter within last 6 months    Recent Outpatient Visits          2 months ago Atrial fibrillation with rapid ventricular response [facility])   [facility] [name], MD   2 months ago Productive cough   [facility] [name], MD   3 months ago Viral syndrome   [facility] Welford Roche D, MD   3 months ago Sinus pressure   [facility] [name], MD   5 months ago Morbid obesity [employer] [facility])   [facility] [name], MD      Future Appointments            In 1 month Carlynn Purl, Danna Hefty, MD [facility], [facility]

## 2020-02-07 ENCOUNTER — Other Ambulatory Visit: Payer: Self-pay

## 2020-02-07 ENCOUNTER — Other Ambulatory Visit: Payer: Self-pay | Admitting: Gastroenterology

## 2020-02-07 MED ORDER — FAMOTIDINE 20 MG PO TABS
20.0000 mg | ORAL_TABLET | Freq: Every day | ORAL | 0 refills | Status: DC
Start: 2020-02-07 — End: 2020-06-06

## 2020-02-08 ENCOUNTER — Other Ambulatory Visit: Payer: Self-pay | Admitting: Family Medicine

## 2020-02-08 ENCOUNTER — Telehealth: Payer: Self-pay

## 2020-02-08 ENCOUNTER — Telehealth: Payer: Self-pay | Admitting: Cardiology

## 2020-02-08 DIAGNOSIS — Z1231 Encounter for screening mammogram for malignant neoplasm of breast: Secondary | ICD-10-CM

## 2020-02-08 NOTE — Telephone Encounter (Signed)
Patient with diagnosis of A Fib on Eliquis for anticoagulation.    Procedure: IGSS, R maxillary antrotomy w/ tissue removal, right ethmoidectomy w/ right frontal exploration, right sphenoidectomy   Date of procedure: TBD    :[id number]}  CHA2DS2-VASc Score = 2  This indicates a 2.2% annual risk of stroke. The patient's score is based upon: CHF History: 0 HTN History: 1 Diabetes History: 0 Stroke History: 0 Vascular Disease History: 0 Age Score: 0 Gender Score: 1    CrCl 108 mL/min using adjusted body weight Platelet count 344K  Per office protocol, patient can hold Eliquis for 3 days prior to procedure.    Patient will not need bridging with Lovenox (enoxaparin) around procedure.  If surgeon needs a 5-7 day Eliquis hold, will need approval from Dr Azucena Cecil

## 2020-02-08 NOTE — Telephone Encounter (Signed)
   Primary Cardiologist: Debbe Odea, MD  Chart reviewed as part of pre-operative protocol coverage.   Per pharmacy recommendations, patient can hold eliquis 3 days prior to her upcoming ENT surgery with plans to restart as soon as her surgeon clears her to do so. Please note, this recommendations is different than what was requested. If additional hold time is needed will need to obtain further input from Dr. Azucena Cecil.  I will route this recommendation to the requesting party via Epic fax function and remove from pre-op pool.  Please call with questions.  Beatriz Stallion, PA-C 02/08/2020, 3:11 PM

## 2020-02-08 NOTE — Telephone Encounter (Signed)
   [facility] Pre-operative Risk Assessment    HEARTCARE STAFF: - Please ensure there is not already an duplicate clearance open for this procedure. - Under Visit Info/Reason for Call, type in Other and utilize the format Clearance MM/DD/YY or Clearance TBD. Do not use dashes or single digits. - If request is for dental extraction, please clarify the # of teeth to be extracted.  Request for surgical clearance:  1. What type of surgery is being performed? IGSS, R maxillary antrotomy w/ tissue removal, right ethmoidectomy w/ right frontal exploration, right sphenoidectomy   2. When is this surgery scheduled? TBD   3. What type of clearance is required (medical clearance vs. Pharmacy clearance to hold med vs. Both)? pharm  4. Are there any medications that need to be held prior to surgery and how long?eliquis 5-7 days prior  5. Practice name and name of physician performing surgery? [facility] Dr. Kathyrn Sheriff  6. What is the office phone number? [phone] ext [phone]   7.   What is the office fax number? [phone]  8.   Anesthesia type (None, local, MAC, general) ? general   Marykay Lex 02/08/2020, 10:03 AM  _________________________________________________________________   (provider comments below)

## 2020-02-11 ENCOUNTER — Other Ambulatory Visit: Payer: Self-pay | Admitting: Psychiatry

## 2020-02-11 DIAGNOSIS — F5105 Insomnia due to other mental disorder: Secondary | ICD-10-CM

## 2020-02-24 ENCOUNTER — Telehealth (INDEPENDENT_AMBULATORY_CARE_PROVIDER_SITE_OTHER): Payer: BC Managed Care – PPO | Admitting: Psychiatry

## 2020-02-24 ENCOUNTER — Other Ambulatory Visit: Payer: Self-pay

## 2020-02-24 ENCOUNTER — Encounter: Payer: Self-pay | Admitting: Psychiatry

## 2020-02-24 DIAGNOSIS — F3342 Major depressive disorder, recurrent, in full remission: Secondary | ICD-10-CM

## 2020-02-24 DIAGNOSIS — F5105 Insomnia due to other mental disorder: Secondary | ICD-10-CM

## 2020-02-24 DIAGNOSIS — F411 Generalized anxiety disorder: Secondary | ICD-10-CM

## 2020-02-24 MED ORDER — RAMELTEON 8 MG PO TABS: 8.0000 mg | ORAL_TABLET | Freq: Every day | ORAL | 1 refills | Status: DC

## 2020-02-24 NOTE — Progress Notes (Signed)
Virtual Visit via Video Note  I connected with Regina Christensen on 02/24/20 at  9:00 AM EDT by a video enabled telemedicine application and verified that I am speaking with the correct person using two identifiers.  Location Provider Location : [facility] Patient Location : Home  Participants: Patient , Provider    I discussed the limitations of evaluation and management by telemedicine and the availability of in person appointments. The patient expressed understanding and agreed to proceed.    I discussed the assessment and treatment plan with the patient. The patient was provided an opportunity to ask questions and all were answered. The patient agreed with the plan and demonstrated an understanding of the instructions.   The patient was advised to call back or seek an in-person evaluation if the symptoms worsen or if the condition fails to improve as anticipated.   BH MD OP Progress Note  02/24/2020 1:12 PM IAN CAVEY  MRN:  [id number]  Chief Complaint:  Chief Complaint    Follow-up     HPI: Regina Christensen is a 64 year old African-American female, retired, lives in [place], has a history of GAD, MDD, hypertension, gastric bypass, hyperlipidemia, OSA, arthritis was evaluated by telemedicine today.  Patient today reports she is currently doing well with regards to her mood.  Denies any anxiety or depression.  Patient however does report that she continues to struggle with sleep.  She took the trazodone for a couple of weeks however had nightmares and hence had to stop taking it.  She continues to use CPAP.  Patient denies any suicidality, homicidality or perceptual disturbances.  She does struggle with vertigo on and off and reports she is currently getting a CT scan of her brain to figure out what is going on.  She reports she also has chronic sinusitis and may need surgery for the same.  She is currently following up with ENT for the same.  She also has upcoming knee  surgery on December 8.  Patient overall however is coping well.  She is compliant on venlafaxine and denies side effects.  Denies any other concerns today.    Visit Diagnosis:    ICD-10-CM   1. GAD (generalized anxiety disorder)  F41.1   2. MDD (major depressive disorder), recurrent, in full remission (HCC)  F33.42   3. Insomnia due to mental condition  F51.05 ramelteon (ROZEREM) 8 MG tablet    Past Psychiatric History: I have reviewed past psychiatric history from my progress note on 01/27/2019.  Past trials of Prozac, Effexor.  Past Medical History:  Past Medical History:  Diagnosis Date  . Arthritis   . Depression   . Hypertension   . Morbid obesity with BMI of 45.0-49.9, adult (HCC)   . Obesity   . Sleep apnea     Past Surgical History:  Procedure Laterality Date  . ABDOMINAL HYSTERECTOMY    . BREAST EXCISIONAL BIOPSY Left   . ENDOMETRIAL ABLATION     x2  . GASTRIC BYPASS     sleeve    Family Psychiatric History: I have reviewed family psychiatric history from my progress note on 01/27/2019  Family History:  Family History  Problem Relation Age of Onset  . Heart disease Father   . Cancer Brother   . Mental illness Neg Hx     Social History: I have reviewed social history from my progress note on 01/27/2019 Social History   Socioeconomic History  . Marital status: Divorced    Spouse name: Not  on file  . Number of children: 2  . Years of education: Not on file  . Highest education level: Associate degree: occupational, Scientist, product/process development, or vocational program  Occupational History  . Occupation: patient Tax adviser: [employer]  Tobacco Use  . Smoking status: Never Smoker  . Smokeless tobacco: Never Used  Vaping Use  . Vaping Use: Never used  Substance and Sexual Activity  . Alcohol use: No    Alcohol/week: 0.0 standard drinks    Comment: rare  . Drug use: No  . Sexual activity: Never  Other Topics Concern  . 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 Difficulty of Paying Living Expenses: Not on file  Food Insecurity:   . Worried About Programme researcher, broadcasting/film/video in the Last Year: Not on file  . Ran Out of Food in the Last Year: Not on file  Transportation Needs:   . Lack of Transportation (Medical): Not on file  . Lack of Transportation (Non-Medical): Not on file  Physical Activity:   . Days of Exercise per Week: Not on file  . Minutes of Exercise per Session: Not on file  Stress:   . Feeling of Stress : Not on file  Social Connections:   . Frequency of Communication with Friends and Family: Not on file  . Frequency of Social Gatherings with Friends and Family: Not on file  . Attends Religious Services: Not on file  . Active Member of Clubs or Organizations: Not on file  . Attends Banker Meetings: Not on file  . Marital Status: Not on file    Allergies:  Allergies  Allergen Reactions  . Ace Inhibitors Cough  . Hctz [Hydrochlorothiazide] Rash    face    Metabolic Disorder Labs: Lab Results  Component Value Date   HGBA1C 6.3 (H) 11/29/2019   MPG 134 11/29/2019   MPG 134.11 02/03/2019   No results found for: PROLACTIN Lab Results  Component Value Date   CHOL 141 11/29/2019   TRIG 98 11/29/2019   HDL 73 11/29/2019   CHOLHDL 1.9 11/29/2019   VLDL 28 02/08/2019   LDLCALC 50 11/29/2019   LDLCALC 30 02/08/2019   Lab Results  Component Value Date   TSH 1.05 11/29/2019   TSH 1.288 02/03/2019    Therapeutic Level Labs: No results found for: LITHIUM No results found for: VALPROATE No components found for:  CBMZ  Current Medications: Current Outpatient Medications  Medication Sig Dispense Refill  . ergocalciferol (VITAMIN D2) 1.25 MG (50000 UT) capsule Take by mouth.    Marland Kitchen acetaminophen  (TYLENOL 8 HOUR ARTHRITIS PAIN) 650 MG CR tablet Take 1 tablet (650 mg total) by mouth every 8 (eight) hours as needed for pain. (Patient taking differently: Take 1,300 mg by mouth every 8 (eight) hours as needed for pain. ) 90 tablet 0  . amLODipine (NORVASC) 2.5 MG tablet TAKE 1 TABLET BY MOUTH EVERY DAY 90 tablet 0  . apixaban (ELIQUIS) 5 MG TABS tablet Take 1 tablet (5 mg total) by mouth 2 (two) times daily. 180 tablet 2  . azelastine (ASTELIN) 0.1 % nasal spray PLACE 2 SPRAYS INTO BOTH NOSTRILS 2 (TWO) TIMES DAILY 30 mL 1  . cloNIDine (  CATAPRES - DOSED IN MG/24 HR) 0.1 mg/24hr patch One weekly 12 patch 1  . famotidine (PEPCID) 20 MG tablet Take 1 tablet (20 mg total) by mouth daily. 90 tablet 0  . fexofenadine (ALLEGRA) 180 MG tablet Take 180 mg by mouth daily.    . Fluticasone-Umeclidin-Vilant (TRELEGY ELLIPTA) 100-62.5-25 MCG/INH AEPB Inhale 1 Inhaler into the lungs daily. 60 each 0  . levofloxacin (LEVAQUIN) 750 MG tablet Take 750 mg by mouth daily.    . meclizine (ANTIVERT) 12.5 MG tablet Take 1 tablet (12.5 mg total) by mouth 3 (three) times daily as needed for dizziness. (Patient not taking: Reported on 01/20/2020) 30 tablet 0  . metoprolol tartrate (LOPRESSOR) 100 MG tablet Take 1 tablet (100 mg total) by mouth 2 (two) times daily. 180 tablet 3  . predniSONE (STERAPRED UNI-PAK 48 TAB) 10 MG (48) TBPK tablet Take by mouth as directed.    . ramelteon (ROZEREM) 8 MG tablet Take 1 tablet (8 mg total) by mouth at bedtime. 30 tablet 1  . Semaglutide, 1 MG/DOSE, (OZEMPIC, 1 MG/DOSE,) 2 MG/1.5ML SOPN Inject 1 mg into the skin once a week. 9 mL 1  . venlafaxine XR (EFFEXOR-XR) 150 MG 24 hr capsule TAKE 1 CAPSULE  DAILY WITH BREAKFAST 90 capsule 0  . Vitamin D, Ergocalciferol, (DRISDOL) 1.25 MG (50000 UNIT) CAPS capsule Take 50,000 Units by mouth once a week.    Marland Kitchen XIIDRA 5 % SOLN Place 1 drop into both eyes 2 (two) times daily.  4   No current facility-administered medications for this visit.      Musculoskeletal: Strength & Muscle Tone: UTA Gait & Station: UTA Patient leans: N/A  Psychiatric Specialty Exam: Review of Systems  Musculoskeletal:       Knee pain  Psychiatric/Behavioral: Positive for sleep disturbance.  All other systems reviewed and are negative.   There were no vitals taken for this visit.There is no height or weight on file to calculate BMI.  General Appearance: Casual  Eye Contact:  Fair  Speech:  Normal Rate  Volume:  Normal  Mood:  Euthymic  Affect:  Congruent  Thought Process:  Goal Directed and Descriptions of Associations: Intact  Orientation:  Full (Time, Place, and Person)  Thought Content: Logical   Suicidal Thoughts:  No  Homicidal Thoughts:  No  Memory:  Immediate;   Fair Recent;   Fair Remote;   Fair  Judgement:  Fair  Insight:  Fair  Psychomotor Activity:  Normal  Concentration:  Concentration: Fair and Attention Span: Good  Recall:  [employer] of Knowledge: Fair  Language: Fair  Akathisia:  No  Handed:  Right  AIMS (if indicated): UTA  Assets:  Communication Skills Desire for Improvement Housing Social Support  ADL's:  Intact  Cognition: WNL  Sleep:  Poor   Screenings: GAD-7     Office Visit from 01/27/2019 in [facility] Office Visit from 01/21/2019 in [facility] Office Visit from 06/21/2018 in [facility] Office Visit from 04/26/2018 in [facility] Office Visit from 02/05/2018 in [facility]  Total GAD-7 Score 10 6 0 0 0    PHQ2-9     Office Visit from 11/29/2019 in [facility] Video Visit from 11/17/2019 in [facility] Office Visit from 11/03/2019 in [facility] Office Visit from 10/19/2019 in [facility] Office Visit from 08/26/2019 in [facility]  PHQ-2 Total Score 0  0 0 0 0  PHQ-9 Total Score 0 0 0 0 2        Assessment and Plan: Regina QuaJosephine S Sanluis is a 64 year old African-American female, divorced, retired, lives in [place] has a history of anxiety, depression, hypertension, hyperlipidemia, OSA, arthritis was evaluated by telemedicine today.  She is biologically predisposed given her health problems.  Patient with psychosocial stressors of upcoming surgery.  Patient with recent sleep issues and adverse side effects to trazodone.  Plan as noted below.  Plan GAD-stable Venlafaxine extended release 150 mg p.o. daily  MDD in remission Venlafaxine extended release 150 mg p.o. daily  Insomnia-unstable Discontinue trazodone for side effects. Start Rozerem 8 mg p.o. nightly Continue sleep hygiene techniques. Continue CPAP  Follow-up in clinic in 1 months or sooner if needed.  I have spent atleast 20 minutes face to face by video with patient today. More than 50 % of the time was spent for preparing to see the patient ( e.g., review of test, records ), ordering medications and test ,psychoeducation and supportive psychotherapy and care coordination,as well as documenting clinical information in electronic health record. This note was generated in part or whole with voice recognition software. Voice recognition is usually quite accurate but there are transcription errors that can and very often do occur. I apologize for any typographical errors that were not detected and corrected.      Regina LongsSaramma Jakori Burkett, MD 02/24/2020, 1:12 PM

## 2020-02-24 NOTE — Patient Instructions (Signed)
Ramelteon tablets What is this medicine? RAMELTEON (ram EL tee on) is used to treat insomnia. This medicine helps you to fall asleep. This medicine may be used for other purposes; ask your health care provider or pharmacist if you have questions. COMMON BRAND NAME(S): Rozerem What should I tell my health care provider before I take this medicine? They need to know if you have any of these conditions:  depression  history of a drug or alcohol abuse problem  liver disease  lung or breathing disease  suicidal thoughts  an unusual or allergic reaction to ramelteon, other medicines, foods, dyes, or preservatives  pregnant or trying to get pregnant  breast-feeding How should I use this medicine? Take this medicine by mouth with a glass of water. Do not break tablets; swallow whole. Follow the directions on the prescription label. It is better to take this medicine on an empty stomach and only when you are ready for bed. Do not take your medicine more often than directed. A special MedGuide will be given to you by the pharmacist with each prescription and refill. Be sure to read this information carefully each time. Talk to your pediatrician regarding the use of this medicine in children. Special care may be needed. Overdosage: If you think you have taken too much of this medicine contact a poison control center or emergency room at once. NOTE: This medicine is only for you. Do not share this medicine with others. What if I miss a dose? This does not apply. This medicine should only be taken immediately before going to sleep. Do not take double or extra doses. What may interact with this medicine? Do not take this medicine with any of the following medications:  fluvoxamine  melatonin This medicine may also interact with the following medications:  medicines used to treat fungal infections like ketoconazole, fluconazole, or itraconazole  rifampin This list may not describe all  possible interactions. Give your health care provider a list of all the medicines, herbs, non-prescription drugs, or dietary supplements you use. Also tell them if you smoke, drink alcohol, or use illegal drugs. Some items may interact with your medicine. What should I watch for while using this medicine? Visit your doctor or health care professional for regular checks on your progress. Keep a regular sleep schedule by going to bed at about the same time each night. Avoid caffeine-containing drinks in the evening hours. Talk to your doctor if you still have trouble sleeping within 7 to 10 days of using this medicine. This may mean there is another cause for your sleep problems. After taking this medicine, you may get up out of bed and do an activity that you do not know you are doing. The next morning, you may have no memory of this. Activities include driving a car ("sleep-driving"), making and eating food, talking on the phone, sexual activity, and sleep-walking. Serious injuries have occurred. Call your doctor right away if you find out you have done any of these activities. Do not take this medicine if you have used alcohol that evening. Do not take it if you have taken another medicine for sleep. The risk of doing these sleep-related activities is higher. Do not take this medicine unless you are able to stay in bed for a full night (7 to 8 hours) before you must be active again. You may have a decrease in mental alertness the day after use, even if you feel that you are fully awake. Tell your doctor if   you will need to perform activities requiring full alertness, such as driving, the next day. Do not stand or sit up quickly after taking this medicine, especially if you are an older patient. This reduces the risk of dizzy or fainting spells. If you or your family notice any changes in your behavior, such as new or worsening depression, thoughts of harming yourself, anxiety, other unusual or disturbing  thoughts, or memory loss, call your doctor right away. What side effects may I notice from receiving this medicine? Side effects that you should report to your doctor or health care professional as soon as possible:  allergic reactions like skin rash, itching or hives, swelling of the face, lips, or tongue  breast milk production or discharge  breathing problems  joint or muscle pain  depression, suicidal thoughts  missed monthly period (for women)  unusual activities while asleep like driving, eating, making phone calls  unusually weak or tired  worsening of insomnia Side effects that usually do not require medical attention (report to your doctor or health care professional if they continue or are bothersome):  bad taste  daytime sleepiness  decreased sex drive  diarrhea  headache  nausea This list may not describe all possible side effects. Call your doctor for medical advice about side effects. You may report side effects to FDA at 1-800-FDA-1088. Where should I keep my medicine? Keep out of the reach of children. Store at room temperature between 15 and 30 degrees C (59 and 86 degrees F). Keep the container tightly closed. Protect from moisture and humidity. Throw away any unused medicine after the expiration date. NOTE: This sheet is a summary. It may not cover all possible information. If you have questions about this medicine, talk to your doctor, pharmacist, or health care provider.  2020 Elsevier/Gold Standard (2017-10-02 12:18:24)  

## 2020-02-28 DIAGNOSIS — I639 Cerebral infarction, unspecified: Secondary | ICD-10-CM

## 2020-02-28 HISTORY — DX: Cerebral infarction, unspecified: I63.9

## 2020-02-29 ENCOUNTER — Observation Stay (HOSPITAL_COMMUNITY)
Admission: EM | Admit: 2020-02-29 | Discharge: 2020-03-02 | Disposition: A | Discharge status: Home / Self Care | Payer: BC Managed Care – PPO | Attending: Internal Medicine | Admitting: Internal Medicine

## 2020-02-29 ENCOUNTER — Encounter (HOSPITAL_COMMUNITY): Payer: Self-pay | Admitting: Emergency Medicine

## 2020-02-29 ENCOUNTER — Other Ambulatory Visit: Payer: Self-pay

## 2020-02-29 DIAGNOSIS — I10 Essential (primary) hypertension: Secondary | ICD-10-CM | POA: Insufficient documentation

## 2020-02-29 DIAGNOSIS — I152 Hypertension secondary to endocrine disorders: Secondary | ICD-10-CM

## 2020-02-29 DIAGNOSIS — Z7901 Long term (current) use of anticoagulants: Secondary | ICD-10-CM | POA: Insufficient documentation

## 2020-02-29 DIAGNOSIS — G4733 Obstructive sleep apnea (adult) (pediatric): Secondary | ICD-10-CM | POA: Diagnosis not present

## 2020-02-29 DIAGNOSIS — I4891 Unspecified atrial fibrillation: Secondary | ICD-10-CM | POA: Insufficient documentation

## 2020-02-29 DIAGNOSIS — E1159 Type 2 diabetes mellitus with other circulatory complications: Secondary | ICD-10-CM

## 2020-02-29 DIAGNOSIS — R42 Dizziness and giddiness: Secondary | ICD-10-CM | POA: Diagnosis present

## 2020-02-29 DIAGNOSIS — Z79899 Other long term (current) drug therapy: Secondary | ICD-10-CM | POA: Insufficient documentation

## 2020-02-29 DIAGNOSIS — Z20822 Contact with and (suspected) exposure to covid-19: Secondary | ICD-10-CM | POA: Diagnosis not present

## 2020-02-29 DIAGNOSIS — I639 Cerebral infarction, unspecified: Principal | ICD-10-CM | POA: Insufficient documentation

## 2020-02-29 DIAGNOSIS — I4819 Other persistent atrial fibrillation: Secondary | ICD-10-CM | POA: Diagnosis present

## 2020-02-29 DIAGNOSIS — F411 Generalized anxiety disorder: Secondary | ICD-10-CM | POA: Diagnosis present

## 2020-02-29 HISTORY — DX: Unspecified atrial fibrillation: I48.91

## 2020-02-29 LAB — CBC
HCT: 44.4 % (ref 36.0–46.0)
Hemoglobin: 14.1 g/dL (ref 12.0–15.0)
MCH: 29.4 pg (ref 26.0–34.0)
MCHC: 31.8 g/dL (ref 30.0–36.0)
MCV: 92.5 fL (ref 80.0–100.0)
Platelets: 303 10*3/uL (ref 150–400)
RBC: 4.8 MIL/uL (ref 3.87–5.11)
RDW: 14.6 % (ref 11.5–15.5)
WBC: 8.3 10*3/uL (ref 4.0–10.5)
nRBC: 0 % (ref 0.0–0.2)

## 2020-02-29 LAB — COMPREHENSIVE METABOLIC PANEL
ALT: 13 U/L (ref 0–44)
AST: 14 U/L — ABNORMAL LOW (ref 15–41)
Albumin: 3.4 g/dL — ABNORMAL LOW (ref 3.5–5.0)
Alkaline Phosphatase: 79 U/L (ref 38–126)
Anion gap: 8 (ref 5–15)
BUN: 9 mg/dL (ref 8–23)
CO2: 26 mmol/L (ref 22–32)
Calcium: 9.1 mg/dL (ref 8.9–10.3)
Chloride: 105 mmol/L (ref 98–111)
Creatinine, Ser: 0.76 mg/dL (ref 0.44–1.00)
GFR, Estimated: >60 mL/min (ref >60)
Glucose, Bld: 84 mg/dL (ref 70–99)
Potassium: 3.5 mmol/L (ref 3.5–5.1)
Sodium: 139 mmol/L (ref 135–145)
Total Bilirubin: 0.4 mg/dL (ref 0.3–1.2)
Total Protein: 7 g/dL (ref 6.5–8.1)

## 2020-02-29 LAB — RESPIRATORY PANEL BY RT PCR (FLU A&B, COVID)
Influenza A by PCR: NEGATIVE
Influenza B by PCR: NEGATIVE
SARS Coronavirus 2 by RT PCR: NEGATIVE

## 2020-02-29 LAB — DIFFERENTIAL
Abs Immature Granulocytes: 0.03 10*3/uL (ref 0.00–0.07)
Basophils Absolute: 0 10*3/uL (ref 0.0–0.1)
Basophils Relative: 1 %
Eosinophils Absolute: 0.1 10*3/uL (ref 0.0–0.5)
Eosinophils Relative: 1 %
Immature Granulocytes: 0 %
Lymphocytes Relative: 24 %
Lymphs Abs: 2 10*3/uL (ref 0.7–4.0)
Monocytes Absolute: 0.6 10*3/uL (ref 0.1–1.0)
Monocytes Relative: 7 %
Neutro Abs: 5.6 10*3/uL (ref 1.7–7.7)
Neutrophils Relative %: 67 %

## 2020-02-29 LAB — PROTIME-INR
INR: 1.1 (ref 0.8–1.2)
Prothrombin Time: 14.2 seconds (ref 11.4–15.2)

## 2020-02-29 LAB — APTT: aPTT: 37 seconds — ABNORMAL HIGH (ref 24–36)

## 2020-02-29 MED ORDER — VENLAFAXINE HCL ER 75 MG PO CP24
150.0000 mg | ORAL_CAPSULE | Freq: Every day | ORAL | Status: DC
  Administered 2020-03-01 – 2020-03-02 (×2): 150 mg via ORAL
  Filled 2020-02-29: qty 1
  Filled 2020-02-29 (×2): qty 2

## 2020-02-29 MED ORDER — MECLIZINE HCL 12.5 MG PO TABS: 12.5000 mg | ORAL_TABLET | Freq: Three times a day (TID) | ORAL | Status: DC | PRN

## 2020-02-29 MED ORDER — ACETAMINOPHEN 325 MG PO TABS
650.0000 mg | ORAL_TABLET | ORAL | Status: DC | PRN
  Administered 2020-03-01: 650 mg via ORAL
  Filled 2020-02-29: qty 2

## 2020-02-29 MED ORDER — LIFITEGRAST 5 % OP SOLN
1.0000 [drp] | Freq: Two times a day (BID) | OPHTHALMIC | Status: DC
  Administered 2020-03-01: 1 [drp] via OPHTHALMIC

## 2020-02-29 MED ORDER — ACETAMINOPHEN 160 MG/5ML PO SOLN: 650.0000 mg | ORAL | Status: DC | PRN

## 2020-02-29 MED ORDER — FAMOTIDINE 20 MG PO TABS
20.0000 mg | ORAL_TABLET | Freq: Every day | ORAL | Status: DC
  Administered 2020-03-01 – 2020-03-02 (×2): 20 mg via ORAL
  Filled 2020-02-29 (×2): qty 1

## 2020-02-29 MED ORDER — STROKE: EARLY STAGES OF RECOVERY BOOK
Freq: Once | Status: AC
  Filled 2020-02-29: qty 1

## 2020-02-29 MED ORDER — AZELASTINE HCL 0.1 % NA SOLN
2.0000 | Freq: Two times a day (BID) | NASAL | Status: DC
  Administered 2020-03-01 – 2020-03-02 (×3): 2 via NASAL
  Filled 2020-02-29 (×2): qty 30

## 2020-02-29 MED ORDER — APIXABAN 5 MG PO TABS
5.0000 mg | ORAL_TABLET | Freq: Two times a day (BID) | ORAL | Status: DC
  Administered 2020-03-01 – 2020-03-02 (×4): 5 mg via ORAL
  Filled 2020-02-29 (×4): qty 1

## 2020-02-29 MED ORDER — ACETAMINOPHEN 650 MG RE SUPP: 650.0000 mg | RECTAL | Status: DC | PRN

## 2020-02-29 MED ORDER — LORATADINE 10 MG PO TABS
10.0000 mg | ORAL_TABLET | Freq: Every day | ORAL | Status: DC
  Administered 2020-03-01 – 2020-03-02 (×2): 10 mg via ORAL
  Filled 2020-02-29 (×2): qty 1

## 2020-02-29 NOTE — ED Provider Notes (Signed)
[facility] EMERGENCY DEPARTMENT Provider Note   CSN: [id number] Arrival date & time: 02/29/20  1137     History No chief complaint on file.   Regina Christensen is a 64 y.o. female.  Patient with history of hypertension, diabetes, atrial fibrillation on anticoagulation --presents the emergency department today for evaluation of stroke noted incidentally on an MRI performed yesterday at [facility].  Patient reports having episodes of vertigo.  She was referred to [facility] (neuro) who ordered MRI.  Patient currently is asymptomatic.  Patient denies signs of stroke including: facial droop, slurred speech, aphasia, weakness/numbness in extremities, imbalance/trouble walking. She reports needing upcoming sinus surgery for chronic sinusitis and knee surgery. No fever, chest pain, abdominal pain, UTI sx.   MRI (02/28/20): Brain: There is a 7 x 3 mm (as measured on the coronal diffusion weighted imaging) focus of increased diffusion-weighted signal intensity with corresponding low ADC abnormality in the RIGHT posterior frontal lobe this is best seen on series 9, image #15. This is consistent with an acute infarction.         Past Medical History:  Diagnosis Date  . Arthritis   . Depression   . Hypertension   . Morbid obesity with BMI of 45.0-49.9, adult (HCC)   . Obesity   . Sleep apnea     Patient Active Problem List   Diagnosis Date Noted  . Headache disorder 12/20/2019  . Leukocytosis 07/19/2019  . History of hysterectomy 07/19/2019  . Drowsy 07/19/2019  . Anxiety 07/19/2019  . MDD (major depressive disorder), recurrent, in full remission (HCC) 07/19/2019  . 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 History of total knee arthroplasty 06/06/2019  . A-fib (HCC) 02/08/2019  . Atrial fibrillation with rapid ventricular response (HCC) 02/07/2019  . Atrial fibrillation, new onset (HCC) 02/03/2019  . Bilateral carpal tunnel syndrome 07/30/2016  . OSA  (obstructive sleep apnea) 03/07/2016  . MDD (major depressive disorder), recurrent episode, mild (HCC) 11/30/2015  . Osteoarthritis of both knees 11/30/2015  . BPPV (benign paroxysmal positional vertigo) 08/28/2015  . Essential hypertension 11/09/2014  . Hyperglycemia 11/09/2014  . GAD (generalized anxiety disorder) 11/09/2014  . Acanthosis nigricans 11/09/2014  . Morbid obesity (HCC) 01/06/2012    Past Surgical History:  Procedure Laterality Date  . ABDOMINAL HYSTERECTOMY    . BREAST EXCISIONAL BIOPSY Left   . ENDOMETRIAL ABLATION     x2  . GASTRIC BYPASS     sleeve     OB History   No obstetric history on file.     Family History  Problem Relation Age of Onset  . Heart disease Father   . Cancer Brother   . Mental illness Neg Hx     Social History   Tobacco Use  . Smoking status: Never Smoker  . Smokeless tobacco: Never Used  Vaping Use  . Vaping Use: Never used  Substance Use Topics  . Alcohol use: No    Alcohol/week: 0.0 standard drinks    Comment: rare  . Drug use: No    Home Medications Prior to Admission medications   Medication Sig Start Date End Date Taking? Authorizing Provider  acetaminophen (TYLENOL 8 HOUR ARTHRITIS PAIN) 650 MG CR tablet Take 1 tablet (650 mg total) by mouth every 8 (eight) hours as needed for pain. Patient taking differently: Take 1,300 mg by mouth every 8 (eight) hours as needed for pain.  03/07/16   [name], MD  amLODipine (NORVASC) 2.5 MG tablet TAKE 1 TABLET  BY MOUTH EVERY DAY 02/04/20   [name], MD  apixaban (ELIQUIS) 5 MG TABS tablet Take 1 tablet (5 mg total) by mouth 2 (two) times daily. 03/15/19   Debbe Odea, MD  azelastine (ASTELIN) 0.1 % nasal spray PLACE 2 SPRAYS INTO BOTH NOSTRILS 2 (TWO) TIMES DAILY 07/26/18   Doren Custard, FNP  cloNIDine (CATAPRES - DOSED IN MG/24 HR) 0.1 mg/24hr patch One weekly 08/26/19   [name], MD  ergocalciferol (VITAMIN D2) 1.25 MG (50000 UT) capsule Take by  mouth. 02/06/20 03/07/20  [provider]  famotidine (PEPCID) 20 MG tablet Take 1 tablet (20 mg total) by mouth daily. 02/07/20   Pasty Spillers, MD  fexofenadine (ALLEGRA) 180 MG tablet Take 180 mg by mouth daily.    [provider]  Fluticasone-Umeclidin-Vilant (TRELEGY ELLIPTA) 100-62.5-25 MCG/INH AEPB Inhale 1 Inhaler into the lungs daily. 11/17/19   [name], MD  levofloxacin (LEVAQUIN) 750 MG tablet Take 750 mg by mouth daily. 01/16/20   [provider]  meclizine (ANTIVERT) 12.5 MG tablet Take 1 tablet (12.5 mg total) by mouth 3 (three) times daily as needed for dizziness. Patient not taking: Reported on 01/20/2020 11/29/19   [name], MD  metoprolol tartrate (LOPRESSOR) 100 MG tablet Take 1 tablet (100 mg total) by mouth 2 (two) times daily. 09/09/19   Debbe Odea, MD  predniSONE (STERAPRED UNI-PAK 48 TAB) 10 MG (48) TBPK tablet Take by mouth as directed. 01/16/20   [provider]  ramelteon (ROZEREM) 8 MG tablet Take 1 tablet (8 mg total) by mouth at bedtime. 02/24/20   Jomarie Longs, MD  Semaglutide, 1 MG/DOSE, (OZEMPIC, 1 MG/DOSE,) 2 MG/1.5ML SOPN Inject 1 mg into the skin once a week. 08/26/19   [name], MD  venlafaxine XR (EFFEXOR-XR) 150 MG 24 hr capsule TAKE 1 CAPSULE  DAILY WITH BREAKFAST 01/20/20   Jomarie Longs, MD  Vitamin D, Ergocalciferol, (DRISDOL) 1.25 MG (50000 UNIT) CAPS capsule Take 50,000 Units by mouth once a week. 02/06/20   [provider]  XIIDRA 5 % SOLN Place 1 drop into both eyes 2 (two) times daily. 02/03/17   [provider]    Allergies    Ace inhibitors and Hctz [hydrochlorothiazide]  Review of Systems   Review of Systems  Constitutional: Negative for fever.  HENT: Negative for rhinorrhea and sore throat.   Eyes: Negative for redness.  Respiratory: Negative for cough.   Cardiovascular: Negative for chest pain.  Gastrointestinal: Negative for abdominal pain, diarrhea,  nausea and vomiting.  Genitourinary: Negative for dysuria, frequency, hematuria and urgency.  Musculoskeletal: Negative for myalgias.  Skin: Negative for rash.  Neurological: Positive for dizziness (none currently). Negative for syncope and headaches.    Physical Exam Updated Vital Signs BP (!) 161/89 (BP Location: Right Wrist)   Pulse 71   Temp 98.3 F (36.8 C) (Oral)   Resp 16   SpO2 96%   Physical Exam Vitals and nursing note reviewed.  Constitutional:      Appearance: She is well-developed.  HENT:     Head: Normocephalic and atraumatic.     Right Ear: Tympanic membrane, ear canal and external ear normal.     Left Ear: Tympanic membrane, ear canal and external ear normal.     Nose: Nose normal.     Mouth/Throat:     Pharynx: Uvula midline.  Eyes:     General: Lids are normal.     Extraocular Movements:     Right eye: No  nystagmus.     Left eye: No nystagmus.     Conjunctiva/sclera: Conjunctivae normal.     Pupils: Pupils are equal, round, and reactive to light.  Cardiovascular:     Rate and Rhythm: Normal rate and regular rhythm.  Pulmonary:     Effort: Pulmonary effort is normal.     Breath sounds: Normal breath sounds.  Abdominal:     Palpations: Abdomen is soft.     Tenderness: There is no abdominal tenderness.  Musculoskeletal:     Cervical back: Normal range of motion and neck supple. No tenderness or bony tenderness.  Skin:    General: Skin is warm and dry.  Neurological:     Mental Status: She is alert and oriented to person, place, and time.     GCS: GCS eye subscore is 4. GCS verbal subscore is 5. GCS motor subscore is 6.     Cranial Nerves: No cranial nerve deficit.     Sensory: No sensory deficit.     Coordination: Coordination normal.     Gait: Gait normal.     ED Results / Procedures / Treatments   Labs (all labs ordered are listed, but only abnormal results are displayed) Labs Reviewed  APTT - Abnormal; Notable for the following components:       Result Value   aPTT 37 (*)    All other components within normal limits  COMPREHENSIVE METABOLIC PANEL - Abnormal; Notable for the following components:   Albumin 3.4 (*)    AST 14 (*)    All other components within normal limits  RESPIRATORY PANEL BY RT PCR (FLU A&B, COVID)  PROTIME-INR  CBC  DIFFERENTIAL  ETHANOL  RAPID URINE DRUG SCREEN, HOSP PERFORMED  URINALYSIS, ROUTINE W REFLEX MICROSCOPIC  I-STAT CHEM 8, ED    ED ECG REPORT   Date: 02/29/2020  Rate: 76  Rhythm: normal sinus rhythm  QRS Axis: normal  Intervals: normal  ST/T Wave abnormalities: t-wave abnormality, non-specific  Conduction Disutrbances:none  Narrative Interpretation:   Old EKG Reviewed: unchanged  I have personally reviewed the EKG tracing and agree with the computerized printout as noted.  Radiology No results found.  Procedures Procedures (including critical care time)  Medications Ordered in ED Medications - No data to display  ED Course  I have reviewed the triage vital signs and the nursing notes.  Pertinent labs & imaging results that were available during my care of the patient were reviewed by me and considered in my medical decision making (see chart for details).  Patient seen and examined. Work-up initiated. Reviewed MRI results in care everywhere. Will ask neurology for recommendations.   Vital signs reviewed and are as follows: BP (!) 161/89 (BP Location: Right Wrist)   Pulse 71   Temp 98.3 F (36.8 C) (Oral)   Resp 16   SpO2 96%   4:10 PM Spoke with neuro APP K. Kirby-Fischbach, neuro to consult.   5:52 PM Neurology has seen patient. Agree with admission for CVA. Pt has CD with MRI images with her and these were reviewed.   Spoke with [facility] who will admit.     MDM Rules/Calculators/A&P                          Admit.   Final Clinical Impression(s) / ED Diagnoses Final diagnoses:  Acute CVA (cerebrovascular accident) (HCC)    Rx / DC Orders ED  Discharge Orders    None  Renne CriglerGeiple, Jumaane Weatherford, PA-C 02/29/20 1754    Sabas SousBero, Michael M, MD 03/02/20 1540

## 2020-02-29 NOTE — ED Notes (Signed)
Meal Tray Delivered to Pt.

## 2020-02-29 NOTE — ED Triage Notes (Signed)
Pt here from home sent over by Md after having an MRI  That showed a small stroke , pt is not having symptoms , only symptom prior to mri was vertigo

## 2020-02-29 NOTE — H&P (Signed)
History and Physical  Regina Christensen WSF:[id number] DOB: 05/15/1955 DOA: 02/29/2020  PCP: [name], MD   Chief Complaint: vertigo  HPI:  64 year old woman PMH including atrial fibrillation on apixaban presented to the emergency department today after an outpatient MRI found a small stroke.  Admitted for further evaluation and neurology consultation.  Patient had an episode of vertigo in September and then a second episode about 2 weeks ago with associated headache.  She was seen by ENT, diagnosed with sinus disease and plans were made for surgery in the next week.  She was referred to neurology as an outpatient for evaluation of vertigo, outpatient MRI was obtained which revealed acute stroke and thus she was sent to the emergency department.  Patient reports 2 episodes of vertigo as noted above.  No vertigo now.  No headache now.  No complaints now and review of systems is unrevealing.  ED Course: Basic lab studies unremarkable  Review of Systems:  Negative for fever, visual changes, sore throat, rash, chest pain, SOB,  n/v/abdominal pain.  Positive for shoulder pain from picking up her grandchild.  Past Medical History:  Diagnosis Date  . Arthritis   . Atrial fibrillation (HCC)   . Depression   . Hypertension   . Morbid obesity with BMI of 45.0-49.9, adult (HCC)   . Obesity   . Sleep apnea     Past Surgical History:  Procedure Laterality Date  . ABDOMINAL HYSTERECTOMY    . BREAST EXCISIONAL BIOPSY Left   . ENDOMETRIAL ABLATION     x2  . GASTRIC BYPASS     sleeve  . TOTAL KNEE ARTHROPLASTY Right      reports that she has never smoked. She has never used smokeless tobacco. She reports that she does not drink alcohol and does not use drugs.   Allergies  Allergen Reactions  . Ace Inhibitors Cough  . Hctz [Hydrochlorothiazide] Rash    face    Family History  Problem Relation Age of Onset  . Heart disease Father   . Cancer Brother   . Mental illness Neg  Hx      Prior to Admission medications   Medication Sig Start Date End Date Taking? Authorizing Provider  acetaminophen (TYLENOL 8 HOUR ARTHRITIS PAIN) 650 MG CR tablet Take 1 tablet (650 mg total) by mouth every 8 (eight) hours as needed for pain. Patient taking differently: Take 1,300 mg by mouth every 8 (eight) hours as needed for pain.  03/07/16  Yes Sowles, Danna Hefty, MD  amLODipine (NORVASC) 2.5 MG tablet TAKE 1 TABLET BY MOUTH EVERY DAY Patient taking differently: Take 2.5 mg by mouth daily.  02/04/20  Yes Sowles, Danna Hefty, MD  apixaban (ELIQUIS) 5 MG TABS tablet Take 1 tablet (5 mg total) by mouth 2 (two) times daily. 03/15/19  Yes Agbor-Etang, Arlys John, MD  azelastine (ASTELIN) 0.1 % nasal spray PLACE 2 SPRAYS INTO BOTH NOSTRILS 2 (TWO) TIMES DAILY Patient taking differently: Place 2 sprays into both nostrils 2 (two) times daily.  07/26/18  Yes Doren Custard, FNP  cloNIDine (CATAPRES - DOSED IN MG/24 HR) 0.1 mg/24hr patch One weekly 08/26/19  Yes Sowles, Danna Hefty, MD  famotidine (PEPCID) 20 MG tablet Take 1 tablet (20 mg total) by mouth daily. 02/07/20  Yes Pasty Spillers, MD  fexofenadine (ALLEGRA) 180 MG tablet Take 180 mg by mouth daily.   Yes [provider]  Fluticasone-Umeclidin-Vilant (TRELEGY ELLIPTA) 100-62.5-25 MCG/INH AEPB Inhale 1 Inhaler into the lungs daily. Patient taking differently: Inhale  1 puff into the lungs daily.  11/17/19  Yes Sowles, Danna Hefty, MD  meclizine (ANTIVERT) 12.5 MG tablet Take 1 tablet (12.5 mg total) by mouth 3 (three) times daily as needed for dizziness. 11/29/19  Yes Sowles, Danna Hefty, MD  metoprolol tartrate (LOPRESSOR) 100 MG tablet Take 1 tablet (100 mg total) by mouth 2 (two) times daily. 09/09/19  Yes Agbor-Etang, Arlys John, MD  Semaglutide, 1 MG/DOSE, (OZEMPIC, 1 MG/DOSE,) 2 MG/1.5ML SOPN Inject 1 mg into the skin once a week. Patient taking differently: Inject 1 mg into the skin once a week. Sunday 08/26/19  Yes Sowles, Danna Hefty, MD  venlafaxine XR  (EFFEXOR-XR) 150 MG 24 hr capsule TAKE 1 CAPSULE  DAILY WITH BREAKFAST Patient taking differently: Take 150 mg by mouth daily with breakfast. TAKE 1 CAPSULE  DAILY WITH BREAKFAST 01/20/20  Yes Eappen, Levin Bacon, MD  Vitamin D, Ergocalciferol, (DRISDOL) 1.25 MG (50000 UNIT) CAPS capsule Take 50,000 Units by mouth once a week. Monday 02/06/20  Yes [provider]  XIIDRA 5 % SOLN Place 1 drop into both eyes 2 (two) times daily. 02/03/17  Yes [provider]  predniSONE (STERAPRED UNI-PAK 48 TAB) 10 MG (48) TBPK tablet Take by mouth as directed. Patient not taking: Reported on 02/29/2020 01/16/20   [provider]  ramelteon (ROZEREM) 8 MG tablet Take 1 tablet (8 mg total) by mouth at bedtime. 02/24/20   Jomarie Longs, MD    Physical Exam: Vitals:   02/29/20 1800 02/29/20 1830  BP: (!) 176/92 (!) 165/85  Pulse: 74 76  Resp: (!) 25 20  Temp:    SpO2: 100% 99%    Constitutional:   . Appears calm and comfortable Eyes:  . pupils and irises appear normal . Normal lids  ENMT:  . grossly normal hearing  . Lips appear normal Neck:  . neck appears normal, no masses . no thyromegaly Respiratory:  . CTA bilaterally, no w/r/r.  . Respiratory effort normal.  Cardiovascular:  . RRR, no m/r/g . No LE extremity edema   Abdomen:  . Abdomen appears normal Musculoskeletal:  .  RUE, LUE, RLE, LLE   o strength and tone grossly normal, no atrophy, no abnormal movements o No tenderness, masses Skin:  . No rashes, lesions, ulcers . palpation of skin: no induration or nodules Neurologic:  . CN 2-12 intact . Sensation all 4 extremities intact Psychiatric:  . Mental status o Mood, affect appropriate . judgment and insight appear intact    I have personally reviewed following labs and imaging studies  Labs:  Complete metabolic panel unremarkable CBC unremarkable  Imaging studies:   MRI not available for review but brought on DVD.  Medical tests:   EKG  independently reviewed: Sinus rhythm, no acute changes  Principal Problem:   Stroke [facility]) Active Problems:   Benign essential HTN   OSA (obstructive sleep apnea)   A-fib (HCC)   Assessment/Plan Acute infarct posterior frontal lobe.  Outpatient study done to evaluate for vertigo. --Possibly secondary to a few missed doses of apixaban. --Pursue work-up as per neurology including TTE, MRA head and neck, usual laboratory studies --Continue apixaban.  Hold BP medications, allow for permissive hypertension. --Usual stroke protocol  Atrial fibrillation --Rate control.  Continue apixaban.  Essential HTN --stable, hold BP meds, allow for permissive hypertension  OSA --CPAP QHS  Severity of Illness: The appropriate patient status for this patient is OBSERVATION. Observation status is judged to be reasonable and necessary in order to provide the required intensity of service  to ensure the patient's safety. The patient's presenting symptoms, physical exam findings, and initial radiographic and laboratory data in the context of their medical condition is felt to place them at decreased risk for further clinical deterioration. Furthermore, it is anticipated that the patient will be medically stable for discharge from the hospital within 2 midnights of admission. The following factors support the patient status of observation.   "" The physical exam findings unremarkable. " The initial radiographic and laboratory data are acute stroke.  DVT prophylaxis:apixaban Code Status: Full Family Communication: daughter at bedside Consults called: neurology     Time spent: 54 minutes  Brendia Sacks, MD  [facility] Direct contact: see [email or web address]  7PM-7AM contact night coverage as below   1. Check the care team in [facility] and look for a) attending/consulting [facility] provider listed and b) the [facility] team listed 2. Log into [email or web address] and use [place]'s universal password to access. If you do  not have the password, please contact the hospital operator. 3. Locate the [facility] provider you are looking for under [facility] and page to a number that you can be directly reached. 4. If you still have difficulty reaching the provider, please page the [facility] (Director on Call) for the Hospitalists listed on amion for assistance.   02/29/2020, 7:36 PM

## 2020-02-29 NOTE — Consult Note (Signed)
Neurology Consult H&P  CC: incidental finding of small stroke found on outpt MRI.   History is obtained from: patient, ED PA  HPI: Regina Christensen is a 64 y.o. female with PMHx of Afib on Eliquis and rest as below. Pt admits to occasionally missing doses of Eliquis and has missed 2 doses over the past week. Pt was sent to the [facility] ED today due to an incidental finding of a small (95mmx7mm) acute infarct to the posterior frontal lobe. The MRI was performed due to pt's c/o vertigo. Her vertigo started 2 weeks ago and she was seen by ENT who found sinus disease and surgery is planned for a week from now. ENT sent her to neuro and that is when the MRI was ordered.  After MRI resulted today, pt was instructed by personal neurologist to come to the [facility] ED and be seen. Neurologist asked to see pt in consult by the ED PA/MD.    LKW: 2 weeks of vertigo but no other stroke sx tpa given?: No. Not a candidate, outside window.  IR Thrombectomy? No Modified Rankin Scale: 0-Completely asymptomatic and back to baseline post- stroke NIHSS: 0  ROS: A complete ROS was performed and is negative except as noted in the HPI.    Past Medical History:  Diagnosis Date  . Arthritis   . Depression   . Hypertension   . Morbid obesity with BMI of 45.0-49.9, adult (HCC)   . Obesity   . Sleep apnea      Family History  Problem Relation Age of Onset  . Heart disease Father   . Cancer Brother   . Mental illness Neg Hx     Social History:  reports that she has never smoked. She has never used smokeless tobacco. She reports that she does not drink alcohol and does not use drugs.   Prior to Admission medications   Medication Sig Start Date End Date Taking? Authorizing Provider  acetaminophen (TYLENOL 8 HOUR ARTHRITIS PAIN) 650 MG CR tablet Take 1 tablet (650 mg total) by mouth every 8 (eight) hours as needed for pain. Patient taking differently: Take 1,300 mg by mouth every 8 (eight) hours as needed for pain.   03/07/16   [name], MD  amLODipine (NORVASC) 2.5 MG tablet TAKE 1 TABLET BY MOUTH EVERY DAY 02/04/20   Carlynn Purl, Danna Hefty, MD  apixaban (ELIQUIS) 5 MG TABS tablet Take 1 tablet (5 mg total) by mouth 2 (two) times daily. 03/15/19   Debbe Odea, MD  azelastine (ASTELIN) 0.1 % nasal spray PLACE 2 SPRAYS INTO BOTH NOSTRILS 2 (TWO) TIMES DAILY 07/26/18   Doren Custard, FNP  cloNIDine (CATAPRES - DOSED IN MG/24 HR) 0.1 mg/24hr patch One weekly 08/26/19   [name], MD  ergocalciferol (VITAMIN D2) 1.25 MG (50000 UT) capsule Take by mouth. 02/06/20 03/07/20  [provider]  famotidine (PEPCID) 20 MG tablet Take 1 tablet (20 mg total) by mouth daily. 02/07/20   Pasty Spillers, MD  fexofenadine (ALLEGRA) 180 MG tablet Take 180 mg by mouth daily.    [provider]  Fluticasone-Umeclidin-Vilant (TRELEGY ELLIPTA) 100-62.5-25 MCG/INH AEPB Inhale 1 Inhaler into the lungs daily. 11/17/19   [name], MD  levofloxacin (LEVAQUIN) 750 MG tablet Take 750 mg by mouth daily. 01/16/20   [provider]  meclizine (ANTIVERT) 12.5 MG tablet Take 1 tablet (12.5 mg total) by mouth 3 (three) times daily as needed for dizziness. Patient not taking: Reported on 01/20/2020 11/29/19  [name], MD  metoprolol tartrate (LOPRESSOR) 100 MG tablet Take 1 tablet (100 mg total) by mouth 2 (two) times daily. 09/09/19   Debbe Odea, MD  predniSONE (STERAPRED UNI-PAK 48 TAB) 10 MG (48) TBPK tablet Take by mouth as directed. 01/16/20   [provider]  ramelteon (ROZEREM) 8 MG tablet Take 1 tablet (8 mg total) by mouth at bedtime. 02/24/20   Jomarie Longs, MD  Semaglutide, 1 MG/DOSE, (OZEMPIC, 1 MG/DOSE,) 2 MG/1.5ML SOPN Inject 1 mg into the skin once a week. 08/26/19   [name], MD  venlafaxine XR (EFFEXOR-XR) 150 MG 24 hr capsule TAKE 1 CAPSULE  DAILY WITH BREAKFAST 01/20/20   Jomarie Longs, MD  Vitamin D, Ergocalciferol, (DRISDOL) 1.25 MG (50000 UNIT) CAPS  capsule Take 50,000 Units by mouth once a week. 02/06/20   [provider]  XIIDRA 5 % SOLN Place 1 drop into both eyes 2 (two) times daily. 02/03/17   [provider]    Exam: Current vital signs: BP (!) 152/72   Pulse 71   Temp 98.3 F (36.8 C) (Oral)   Resp 16   SpO2 98%   Physical Exam  Constitutional: Appears well-developed and well-nourished.  Psych: Affect appropriate to situation Eyes: No scleral injection HENT: No OP obstrucion Head: Normocephalic.  Cardiovascular: Normal rate and regular rhythm.  Respiratory: Effort normal and breath sounds normal to anterior ascultation GI: Soft.  No distension. There is no tenderness.  Skin: WDI  Neuro: Mental Status: Patient is awake, alert, oriented to person, place, month, year, and situation. Patient is able to give a clear and coherent history. No signs of aphasia or neglect. Cranial Nerves: II: Visual Fields are full. Pupils are equal, round, and reactive to light. III,IV, VI: EOMI without ptosis or diploplia.  V: Facial sensation is symmetric to temperature VII: Facial movement is symmetric.  VIII: hearing is intact to voice X: Uvula elevates symmetrically XI: Shoulder shrug is symmetric. XII: tongue is midline without atrophy or fasciculations.  Motor: Tone is normal. Bulk is normal. 5/5 strength was present in all four extremities. Sensory: Sensation is symmetric to light touch and temperature in the arms and legs. Deep Tendon Reflexes: 2+ and symmetric in the biceps and patellae. Plantars: Toes are downgoing bilaterally. cerebellar: FNF and HKS are intact bilaterally.  I have reviewed labs in epic and the pertinent results [facility]:NOBSJ normal. INR 1.1  I have reviewed the images obtained: MRI brain-  Assessment: Regina Christensen is a 64 y.o. female PMHx significant for HTN, AFib and chronic AC. 2 week hx of vertigo leading neuro to do an outpt MRI brain. MRI resulted today with incidental  finding of a small stroke right posterior frontal region.   Impression: Tiny right posterior frontal ischemic stroke. Possibly due to missed doses of Eliquis. Non symptomatic.   Plan: - Recommend vascular imaging with MRA head and neck. - Recommend TTE. - Recommend labs: HbA1c, lipid panel, TSH. - Recommend Statin if LDL > 70 -Continue Eliquis.  - SBP goal - Permissive hypertension first 24 h < 170/90. Hold home medications for now. Normalize BP in 3-5 days.  - Telemetry monitoring for arrhythmia. - Recommend bedside Swallow screen. - Recommend Stroke education. - Recommend PT/OT/SLP consult. . Admit to hospitalists team discussed with Sharia Reeve, PA in ED. Stroke team will follow.   Jimmye Norman, MSN, APN-BC NP/pt seen with Dr. Darnelle Maffucci  Electronically signed by: Dr. Marisue Humble Pager: [phone] 02/29/2020, 4:51 PM

## 2020-03-01 ENCOUNTER — Observation Stay (HOSPITAL_COMMUNITY): Payer: BC Managed Care – PPO

## 2020-03-01 ENCOUNTER — Observation Stay (HOSPITAL_BASED_OUTPATIENT_CLINIC_OR_DEPARTMENT_OTHER): Payer: BC Managed Care – PPO

## 2020-03-01 ENCOUNTER — Other Ambulatory Visit: Payer: Self-pay

## 2020-03-01 DIAGNOSIS — I639 Cerebral infarction, unspecified: Secondary | ICD-10-CM

## 2020-03-01 DIAGNOSIS — R42 Dizziness and giddiness: Secondary | ICD-10-CM

## 2020-03-01 DIAGNOSIS — I6389 Other cerebral infarction: Secondary | ICD-10-CM | POA: Diagnosis not present

## 2020-03-01 LAB — TSH: TSH: 1.358 u[IU]/mL (ref 0.350–4.500)

## 2020-03-01 LAB — ETHANOL: Alcohol, Ethyl (B): <10 mg/dL (ref <10)

## 2020-03-01 LAB — HEMOGLOBIN A1C
Hgb A1c MFr Bld: 6.2 % — ABNORMAL HIGH (ref 4.8–5.6)
Mean Plasma Glucose: 131.24 mg/dL

## 2020-03-01 LAB — ECHOCARDIOGRAM COMPLETE
Area-P 1/2: 3.65 cm2
Height: 68 in
S' Lateral: 3.16 cm

## 2020-03-01 LAB — LIPID PANEL
Cholesterol: 132 mg/dL (ref 0–200)
HDL: 69 mg/dL (ref >40)
LDL Cholesterol: 49 mg/dL (ref 0–99)
Total CHOL/HDL Ratio: 1.9 RATIO
Triglycerides: 69 mg/dL (ref <150)
VLDL: 14 mg/dL (ref 0–40)

## 2020-03-01 LAB — HIV ANTIBODY (ROUTINE TESTING W REFLEX): HIV Screen 4th Generation wRfx: NONREACTIVE

## 2020-03-01 MED ORDER — GADOBUTROL 1 MMOL/ML IV SOLN
10.0000 mL | Freq: Once | INTRAVENOUS | Status: AC | PRN
  Administered 2020-03-01: 10 mL via INTRAVENOUS

## 2020-03-01 NOTE — Progress Notes (Signed)
PROGRESS NOTE    Regina Christensen   AVW:[id number]  DOB: 1955/11/23  DOA: 02/29/2020     0  PCP: [name], MD  CC: dizziness, sent to ER due to abnormal MRI  Hospital Course: Regina Christensen is a 64 yo female with PMH PAF (on Eliquis), chronic left sinus infection, hypertension, depression, arthritis, obesity who presented to the ER after an outpatient MRI revealed a stroke.  She has been having some vertigo since September but only states approximately 2 episodes total since then.  She has also been following outpatient with ENT with plans for surgery on her left sinus which would require holding her Eliquis.  Due to her vertigo, she was referred to neurology outpatient and underwent the MRI which revealed her stroke.  She states she misses approximately 2 or 3 doses of her Eliquis per week due to falling asleep before her evening dose. MRI brain was not repeated in the ER/on admission however care everywhere review of the report noted a 7 x 3 mm infarct involving the right posterior frontal lobe. Some of her vertigo appeared to be positional in nature.  She was evaluated by PT, OT, SLP during hospitalization with no acute needs.  She was referred for further vestibular work-up during hospitalization.   Interval History:  No events overnight.  Resting comfortable in bed feeling in her normal state.  She endorses that she has only had 2 episodes of vertigo since September.  She does state that sometimes standing up triggers her dizziness or changing in certain positions does.  Old records reviewed in assessment of this patient  ROS: Constitutional: negative for chills and fevers, Respiratory: negative for cough, Cardiovascular: negative for chest pain and Gastrointestinal: negative for abdominal pain  Assessment & Plan: * Acute CVA (cerebrovascular accident) (HCC) - she has no deficits aside from vertigo which initially started in Sept so likely unrelated to findings on MRI (small  right posterior frontal lobe infarct) -Vertigo has some positional traits according to patient description -Follow-up neurology evaluation -Continue Eliquis -Echo negative for any shunting or thrombus  Vertigo -Patient describes vertigo being precipitated by certain positions or changes in positions at times -Follow-up vestibular work-up -For now, continue supportive care.  Likely that CVA is not related to her vertigo  A-fib (HCC) -Continue Eliquis - she states she is not on rate control agents but med rec is noted with lopressor; will clarify with patient if truly taking   OSA (obstructive sleep apnea) - continue qhs CPAP  GAD (generalized anxiety disorder) -Continue Effexor  Benign essential HTN -Permissive hypertension being allowed initially -At home on amlodipine 2.5 mg daily, clonidine patch weekly, and lopressor 100 BID   Antimicrobials: None  DVT prophylaxis: Eliquis Code Status: Full Family Communication: None present Disposition Plan: Status is: Observation  The patient remains OBS appropriate and will d/c before 2 midnights.  Dispo: The patient is from: Home              Anticipated d/c is to: Home              Anticipated d/c date is: 1 day              Patient currently is not medically stable to d/c.       Objective: Blood pressure (!) 159/82, pulse 75, temperature 98 F (36.7 C), temperature source Oral, resp. rate 18, height  (1.727 m), SpO2 99 %.  Examination: General appearance: alert, cooperative and no distress Head: Normocephalic,  without obvious abnormality, atraumatic Eyes: EOMI Lungs: clear to auscultation bilaterally Heart: irregularly irregular rhythm and S1, S2 normal Abdomen: normal findings: bowel sounds normal and soft, non-tender Extremities: No obvious edema Skin: mobility and turgor normal Neurologic: Grossly normal. No nystagmus  Consultants:   Neuro  Procedures:   None   Data Reviewed: I have personally  reviewed following labs and imaging studies Results for orders placed or performed during the hospital encounter of 02/29/20 (from the past 24 hour(s))  Respiratory Panel by RT PCR (Flu A&B, Covid) - Nasopharyngeal Swab     Status: None   Collection Time: 02/29/20  5:25 PM   Specimen: Nasopharyngeal Swab  Result Value Ref Range   SARS Coronavirus 2 by RT PCR NEGATIVE NEGATIVE   Influenza A by PCR NEGATIVE NEGATIVE   Influenza B by PCR NEGATIVE NEGATIVE  Ethanol     Status: None   Collection Time: 03/01/20  3:06 AM  Result Value Ref Range   Alcohol, Ethyl (B) <10 <10 mg/dL  TSH     Status: None   Collection Time: 03/01/20  3:06 AM  Result Value Ref Range   TSH 1.358 0.350 - 4.500 uIU/mL  HIV Antibody (routine testing w rflx)     Status: None   Collection Time: 03/01/20  3:06 AM  Result Value Ref Range   HIV Screen 4th Generation wRfx Non Reactive Non Reactive  Hemoglobin A1c     Status: Abnormal   Collection Time: 03/01/20  3:06 AM  Result Value Ref Range   Hgb A1c MFr Bld 6.2 (H) 4.8 - 5.6 %   Mean Plasma Glucose 131.24 mg/dL  Lipid panel     Status: None   Collection Time: 03/01/20  3:06 AM  Result Value Ref Range   Cholesterol 132 0 - 200 mg/dL   Triglycerides 69 <657<150 mg/dL   HDL 69 >84>40 mg/dL   Total CHOL/HDL Ratio 1.9 RATIO   VLDL 14 0 - 40 mg/dL   LDL Cholesterol 49 0 - 99 mg/dL    Recent Results (from the past 240 hour(s))  Respiratory Panel by RT PCR (Flu A&B, Covid) - Nasopharyngeal Swab     Status: None   Collection Time: 02/29/20  5:25 PM   Specimen: Nasopharyngeal Swab  Result Value Ref Range Status   SARS Coronavirus 2 by RT PCR NEGATIVE NEGATIVE Final    Comment: (NOTE) SARS-CoV-2 target nucleic acids are NOT DETECTED.  The SARS-CoV-2 RNA is generally detectable in upper respiratoy specimens during the acute phase of infection. The lowest concentration of SARS-CoV-2 viral copies this assay can detect is 131 copies/mL. A negative result does not preclude  SARS-Cov-2 infection and should not be used as the sole basis for treatment or other patient management decisions. A negative result may occur with  improper specimen collection/handling, submission of specimen other than nasopharyngeal swab, presence of viral mutation(s) within the areas targeted by this assay, and inadequate number of viral copies (<131 copies/mL). A negative result must be combined with clinical observations, patient history, and epidemiological information. The expected result is Negative.  Fact Sheet for Patients:  https://www.moore.com/https://www.fda.gov/media/142436/download  Fact Sheet for Healthcare Providers:  https://www.young.biz/https://www.fda.gov/media/142435/download  This test is no t yet approved or cleared by the [place] FDA and  has been authorized for detection and/or diagnosis of SARS-CoV-2 by FDA under an Emergency Use Authorization (EUA). This EUA will remain  in effect (meaning this test can be used) for the duration of the COVID-19 declaration under Section 564(b)(1)  of the Act, 21 U.S.C. section 360bbb-3(b)(1), unless the authorization is terminated or revoked sooner.     Influenza A by PCR NEGATIVE NEGATIVE Final   Influenza B by PCR NEGATIVE NEGATIVE Final    Comment: (NOTE) The Xpert Xpress SARS-CoV-2/FLU/RSV assay is intended as an aid in  the diagnosis of influenza from Nasopharyngeal swab specimens and  should not be used as a sole basis for treatment. Nasal washings and  aspirates are unacceptable for Xpert Xpress SARS-CoV-2/FLU/RSV  testing.  Fact Sheet for Patients: https://www.moore.com/  Fact Sheet for Healthcare Providers: https://www.young.biz/  This test is not yet approved or cleared by the [place] FDA and  has been authorized for detection and/or diagnosis of SARS-CoV-2 by  FDA under an Emergency Use Authorization (EUA). This EUA will remain  in effect (meaning this test can be used) for the duration of the   Covid-19 declaration under Section 564(b)(1) of the Act, 21  U.S.C. section 360bbb-3(b)(1), unless the authorization is  terminated or revoked. Performed at [facility], [address] [place], [place] [address]      Radiology Studies: MR ANGIO HEAD WO CONTRAST  Result Date: 03/01/2020 CLINICAL DATA:  Stroke follow-up EXAM: MRA NECK WITHOUT AND WITH CONTRAST MRA HEAD WITHOUT CONTRAST TECHNIQUE: Multiplanar and multiecho pulse sequences of the neck were obtained without and with intravenous contrast. Angiographic images of the neck were obtained using MRA technique without and with intravenous contrast.; Angiographic images of the Circle of Willis were obtained using MRA technique without intravenous contrast. CONTRAST:  46mL GADAVIST GADOBUTROL 1 MMOL/ML IV SOLN COMPARISON:  None. FINDINGS: MRA NECK FINDINGS Normal carotid and vertebral artery systems. MRA HEAD FINDINGS POSTERIOR CIRCULATION: --Vertebral arteries: Normal --Inferior cerebellar arteries: Normal. --Basilar artery: Normal. --Superior cerebellar arteries: Normal. --Posterior cerebral arteries: Normal. ANTERIOR CIRCULATION: --Intracranial internal carotid arteries: Normal. --Anterior cerebral arteries (ACA): Normal. --Middle cerebral arteries (MCA): Normal. ANATOMIC VARIANTS: Basilar artery fenestration. IMPRESSION: Normal MRA of the head and neck. Electronically Signed   By: Deatra Robinson M.D.   On: 03/01/2020 02:28   MR ANGIO NECK W WO CONTRAST  Result Date: 03/01/2020 CLINICAL DATA:  Stroke follow-up EXAM: MRA NECK WITHOUT AND WITH CONTRAST MRA HEAD WITHOUT CONTRAST TECHNIQUE: Multiplanar and multiecho pulse sequences of the neck were obtained without and with intravenous contrast. Angiographic images of the neck were obtained using MRA technique without and with intravenous contrast.; Angiographic images of the Circle of Willis were obtained using MRA technique without intravenous contrast. CONTRAST:  45mL GADAVIST  GADOBUTROL 1 MMOL/ML IV SOLN COMPARISON:  None. FINDINGS: MRA NECK FINDINGS Normal carotid and vertebral artery systems. MRA HEAD FINDINGS POSTERIOR CIRCULATION: --Vertebral arteries: Normal --Inferior cerebellar arteries: Normal. --Basilar artery: Normal. --Superior cerebellar arteries: Normal. --Posterior cerebral arteries: Normal. ANTERIOR CIRCULATION: --Intracranial internal carotid arteries: Normal. --Anterior cerebral arteries (ACA): Normal. --Middle cerebral arteries (MCA): Normal. ANATOMIC VARIANTS: Basilar artery fenestration. IMPRESSION: Normal MRA of the head and neck. Electronically Signed   By: Deatra Robinson M.D.   On: 03/01/2020 02:28   ECHOCARDIOGRAM COMPLETE  Result Date: 03/01/2020    ECHOCARDIOGRAM REPORT   Patient Name:   Regina Christensen Date of Exam: 03/01/2020 Medical Rec #:  [id number]          Height:       68.0 in Accession #:    [id number]         Weight:       289.7 lb Date of Birth:  October 31, 1955  BSA:          2.392 m Patient Age:    64 years           BP:           159/82 mmHg Patient Gender: F                  HR:           82 bpm. Exam Location:  Inpatient Procedure: 2D Echo Indications:    Stroke I163.9  History:        Patient has prior history of Echocardiogram examinations, most                 recent 08/09/2019. Arrythmias:Atrial Fibrillation; Risk                 Factors:Hypertension.  Sonographer:    Thurman Coyer RDCS (AE) Referring Phys: [id number] DANIEL P GOODRICH IMPRESSIONS  1. Left ventricular ejection fraction, by estimation, is 55 to 60%. The left ventricle has normal function. The left ventricle has no regional wall motion abnormalities. There is mild left ventricular hypertrophy. Left ventricular diastolic parameters are consistent with Grade I diastolic dysfunction (impaired relaxation).  2. Right ventricular systolic function is normal. The right ventricular size is normal. There is normal pulmonary artery systolic pressure. The estimated right ventricular  systolic pressure is 22.2 mmHg.  3. Left atrial size was mildly dilated.  4. The mitral valve is normal in structure. Trivial mitral valve regurgitation. No evidence of mitral stenosis.  5. The aortic valve is tricuspid. Aortic valve regurgitation is not visualized. No aortic stenosis is present.  6. The inferior vena cava is normal in size with greater than 50% respiratory variability, suggesting right atrial pressure of 3 mmHg. FINDINGS  Left Ventricle: Left ventricular ejection fraction, by estimation, is 55 to 60%. The left ventricle has normal function. The left ventricle has no regional wall motion abnormalities. The left ventricular internal cavity size was normal in size. There is  mild left ventricular hypertrophy. Left ventricular diastolic parameters are consistent with Grade I diastolic dysfunction (impaired relaxation). Right Ventricle: The right ventricular size is normal. No increase in right ventricular wall thickness. Right ventricular systolic function is normal. There is normal pulmonary artery systolic pressure. The tricuspid regurgitant velocity is 2.19 m/s, and  with an assumed right atrial pressure of 3 mmHg, the estimated right ventricular systolic pressure is 22.2 mmHg. Left Atrium: Left atrial size was mildly dilated. Right Atrium: Right atrial size was normal in size. Pericardium: There is no evidence of pericardial effusion. Mitral Valve: The mitral valve is normal in structure. Trivial mitral valve regurgitation. No evidence of mitral valve stenosis. Tricuspid Valve: The tricuspid valve is normal in structure. Tricuspid valve regurgitation is trivial. Aortic Valve: The aortic valve is tricuspid. Aortic valve regurgitation is not visualized. No aortic stenosis is present. Pulmonic Valve: The pulmonic valve was normal in structure. Pulmonic valve regurgitation is not visualized. Aorta: The aortic root is normal in size and structure. Venous: The inferior vena cava is normal in size with  greater than 50% respiratory variability, suggesting right atrial pressure of 3 mmHg. IAS/Shunts: No atrial level shunt detected by color flow Doppler.  LEFT VENTRICLE PLAX 2D LVIDd:         4.29 cm  Diastology LVIDs:         3.16 cm  LV e' medial:    4.56 cm/s LV PW:         1.23  cm  LV E/e' medial:  13.8 LV IVS:        1.24 cm  LV e' lateral:   5.87 cm/s LVOT diam:     2.10 cm  LV E/e' lateral: 10.7 LV SV:         75 LV SV Index:   31 LVOT Area:     3.46 cm  RIGHT VENTRICLE RV S prime:     17.70 cm/s TAPSE (M-mode): 2.0 cm LEFT ATRIUM             Index       RIGHT ATRIUM           Index LA diam:        3.90 cm 1.63 cm/m  RA Area:     19.80 cm LA Vol (A2C):   83.5 ml 34.90 ml/m RA Volume:   46.00 ml  19.23 ml/m LA Vol (A4C):   91.5 ml 38.25 ml/m LA Biplane Vol: 88.9 ml 37.16 ml/m  AORTIC VALVE LVOT Vmax:   95.80 cm/s LVOT Vmean:  60.000 cm/s LVOT VTI:    0.217 m  AORTA Ao Root diam: 3.10 cm MITRAL VALVE                TRICUSPID VALVE MV Area (PHT): 3.65 cm     TR Peak grad:   19.2 mmHg MV Decel Time: 208 msec     TR Vmax:        219.00 cm/s MV E velocity: 62.90 cm/s MV A velocity: 117.00 cm/s  SHUNTS MV E/A ratio:  0.54         Systemic VTI:  0.22 m                             Systemic Diam: 2.10 cm Marca Ancona MD Electronically signed by Marca Ancona MD Signature Date/Time: 03/01/2020/2:36:52 PM    Final    MR ANGIO HEAD WO CONTRAST  Final Result    MR ANGIO NECK W WO CONTRAST  Final Result      Scheduled Meds: . apixaban  5 mg Oral BID  . azelastine  2 spray Each Nare BID  . famotidine  20 mg Oral Daily  . Lifitegrast  1 drop Both Eyes BID  . loratadine  10 mg Oral Daily  . venlafaxine XR  150 mg Oral Q breakfast   PRN Meds: acetaminophen **OR** acetaminophen (TYLENOL) oral liquid 160 mg/5 mL **OR** acetaminophen, meclizine Continuous Infusions:   LOS: 0 days  Time spent: Greater than 50% of the 35 minute visit was spent in counseling/coordination of care for the patient as laid  out in the A&P.   Lewie Chamber, MD [facility] 03/01/2020, 3:00 PM

## 2020-03-01 NOTE — Progress Notes (Signed)
  Echocardiogram 2D Echocardiogram has been performed.  Regina Christensen 03/01/2020, 10:33 AM

## 2020-03-01 NOTE — Assessment & Plan Note (Signed)
Continue Effexor.

## 2020-03-01 NOTE — Evaluation (Addendum)
Physical Therapy Evaluation/Vestibular Assessment Patient Details Name: STARLA DELLER MRN: [id number] DOB: April 25, 1955 Today's Date: 03/01/2020   History of Present Illness  64 year old woman PMH including atrial fibrillation on apixaban presented to the emergency department after an outpatient MRI found a small stroke. Patient had an episode of vertigo in September and then a second episode about 2 weeks ago with associated headache. PMH: arthrtis, A-fib, depression, HTN, morbid obesity, sleep apnea, R TKA, gastric bypass sx.   Clinical Impression  Pt with mild symptoms of "being off" with x1 gaze stability testing and in dix hallpike position bil, but no significant nystagmus or spinning symptoms with any of the testing listed below.  I educated and reviewed x1 seated exercises for gaze stability training and gave medbridge HEP handout.  If pt is here tomorrow we will review the exercises and re-test posterior canals off of end of bed (today did with bed in max trendelenburg).   PT to follow acutely for deficits listed below.      Follow Up Recommendations No PT follow up    Equipment Recommendations  None recommended by PT    Recommendations for Other Services       Precautions / Restrictions Precautions Precautions: Other (comment) Precaution Comments: h/o vertigo      Mobility  Bed Mobility Overal bed mobility: Modified Independent             General bed mobility comments: used rail for vestibular assessment    See details of mobility in OT note, PT session focused on vestibular assessment and treatment.   Balance         03/01/20 0001  Symptom Behavior  Subjective history of current problem episodic vertigo, spinning, has chronic sinus infection, was on antibiotic (could not remeber name), no tinnitus or feeling of fullness, no hearing loss, wears glasses to read or do computer work, has seen an ENT for dizziness, MRI at [facility] (+) for small stroke (we do not  have access to this MRI, our MRI was clear), no head injury or whiplash injury.   Type of Dizziness  Spinning  Frequency of Dizziness intermittent  Duration of Dizziness 2 mins  Symptom Nature Positional  Aggravating Factors Sit to stand  Relieving Factors Lying supine  Progression of Symptoms Better  History of similar episodes yes, 2 in the last 2 months with associated HA  Oculomotor Exam  Oculomotor Alignment Normal  Spontaneous Absent  Gaze-induced  Absent  Smooth Pursuits Intact  Saccades Intact  Oculomotor Exam-Fixation Suppressed   Left Head Impulse neg  Right Head Impulse neg  Vestibulo-Ocular Reflex  VOR 1 Head Only (x 1 viewing) symptomatic with increased trunkal sway same sx with vertical and horizontal   VOR Cancellation Normal  Auditory  Comments normal grossly  Positional Testing  Dix-Hallpike Dix-Hallpike Right;Dix-Hallpike Left  Horizontal Canal Testing Horizontal Canal Right;Horizontal Canal Left  Dix-Hallpike Right  Dix-Hallpike Right Duration mild sx of "not feeling right"  Dix-Hallpike Right Symptoms No nystagmus  Dix-Hallpike Left  Dix-Hallpike Left Duration mild symptoms of "not feeling right" same as R side (no more or less)  Dix-Hallpike Left Symptoms No nystagmus  Horizontal Canal Right  Horizontal Canal Right Duration 0  Horizontal Canal Right Symptoms Normal  Horizontal Canal Left  Horizontal Canal Left Duration 0  Horizontal Canal Left Symptoms Normal  Pertinent Vitals/Pain Pain Assessment: No/denies pain    Home Living Family/patient expects to be discharged to:: Private residence Living Arrangements: Children;Other relatives Available Help at Discharge: Available 24 hours/day Type of Home: Apartment Home Access: Level entry     Home Layout: One level Home Equipment: Walker - 2 wheels;Cane - single point      Prior Function Level of Independence: Independent                Hand Dominance   Dominant Hand: Right    Extremity/Trunk Assessment   Upper Extremity Assessment Upper Extremity Assessment: Defer to OT evaluation    Lower Extremity Assessment Lower Extremity Assessment: Overall WFL for tasks assessed    Cervical / Trunk Assessment Cervical / Trunk Assessment: Normal  Communication   Communication: No difficulties  Cognition Arousal/Alertness: Awake/alert Behavior During Therapy: WFL for tasks assessed/performed Overall Cognitive Status: Within Functional Limits for tasks assessed Area of Impairment: Memory                     Memory: Decreased short-term memory         General Comments: Pt had several instances of forgetting what she was asking me during our session.  If it were only one, I would think this was normal, but it was 3-4 times during our session.       General Comments General comments (skin integrity, edema, etc.): Discussed at length small dietary changes she could make working towards her goal of losing weight and walking more.      Exercises Other Exercises Other Exercises: x1 seated vertical and horizontal gaze stability exercises given with handout.    Assessment/Plan    PT Assessment Patient needs continued PT services  PT Problem List Decreased activity tolerance       PT Treatment Interventions DME instruction;Gait training;Stair training;Functional mobility training;Therapeutic activities;Therapeutic exercise;Balance training;Neuromuscular re-education;Cognitive remediation;Patient/family education    PT Goals (Current goals can be found in the Care Plan section)  Acute Rehab PT Goals Patient Stated Goal: lose weight and not have another stroke PT Goal Formulation: With patient Time For Goal Achievement: 03/15/20 Potential to Achieve Goals: Good    Frequency Min 4X/week   Barriers to discharge           AM-PAC PT "6 Clicks" Mobility  Outcome Measure Help needed turning from your  back to your side while in a flat bed without using bedrails?: A Little Help needed moving from lying on your back to sitting on the side of a flat bed without using bedrails?: A Little Help needed moving to and from a bed to a chair (including a wheelchair)?: None Help needed standing up from a chair using your arms (e.g., wheelchair or bedside chair)?: None Help needed to walk in hospital room?: None Help needed climbing 3-5 steps with a railing? : None 6 Click Score: 22    End of Session   Activity Tolerance: Patient tolerated treatment well Patient left: in bed;with call bell/phone within reach Nurse Communication: Mobility status PT Visit Diagnosis: Dizziness and giddiness (R42)    Time: 0160-1093 (most of session was vestibular assessment, also MD came in) PT Time Calculation (min) (ACUTE ONLY): 77 min   Charges:   PT Evaluation $PT Eval Moderate Complexity: 1 Mod PT Treatments $Therapeutic Exercise: 8-22 mins $Self Care/Home Management: 8-22      Corinna Capra, PT, DPT  Acute Rehabilitation [phone] pager [phone]) [phone] office

## 2020-03-01 NOTE — Assessment & Plan Note (Addendum)
-  Continue Eliquis -Continue Lopressor

## 2020-03-01 NOTE — Progress Notes (Addendum)
Occupational Therapy Evaluation Patient Details Name: Regina Christensen MRN: [id number] DOB: [birth year] Today's Date: 03/01/2020    History of Present Illness 64 year old woman PMH including atrial fibrillation on apixaban presented to the emergency department after an outpatient MRI found a small stroke. Patient had an episode of vertigo in September and then a second episode about 2 weeks ago with associated headache. PMH: arthrtis, A-fib, depression, HTN, morbid obesity, sleep apnea, R TKA, gastric bypass sx.    Clinical Impression   PTA pt independent, lives with her daughter and works part time in a call center. Pt appears at baseline regarding mobility and ADL however she is describing symptoms consistent with possible BPPV. She describes this "spinning" occurring when she "moves/changes her head position". Requested Vestibular eval. Pt expressed fear of "having another stroke". Educated pt about healthy lifestyle choices regarding diet/activitylevel/exercise to reduce risk of stroke. Written information provided. Educated pt on signs/symptoms of stroke using BeFast. Pt verbalized understanding and was very Adult nurse. No further OT needs. OT signing off.     Follow Up Recommendations  No OT follow up    Equipment Recommendations  None recommended by OT    Recommendations for Other Services Other (comment) (Vestibular Eval)     Precautions / Restrictions Precautions Precautions: Other (comment) (1 vertigo episode in last month)      Mobility Bed Mobility Overal bed mobility: Independent                  Transfers Overall transfer level: Independent                    Balance Overall balance assessment: No apparent balance deficits (not formally assessed)                                         ADL either performed or assessed with clinical judgement   ADL Overall ADL's : At baseline    "when I bend my head down I feel that spinning  feeling, so I keep my head straight when I ut my socks on". Pt appears to compensate for apparent vertigo. No falls Discussed her concerns of not sleeping well. Pt has a CPAP but does not like to use it. Discussed how sleep deprivation can lead to difficulty with memory and concentration as she is describing. Pt states seh will try to be more consistent with the use of her CPAP                                         Vision Baseline Vision/History: Wears glasses Wears Glasses: Reading only (when on the computer) Vision Assessment?: Yes Eye Alignment: Within Functional Limits Ocular Range of Motion: Within Functional Limits Alignment/Gaze Preference: Within Defined Limits Tracking/Visual Pursuits: Able to track stimulus in all quads without difficulty Saccades: Within functional limits Convergence: Within functional limits Visual Fields: No apparent deficits     Perception Perception Comments: WFL   Praxis Praxis Praxis tested?: Within functional limits    Pertinent Vitals/Pain Pain Assessment: No/denies pain     Hand Dominance Right   Extremity/Trunk Assessment Upper Extremity Assessment Upper Extremity Assessment: Overall WFL for tasks assessed   Lower Extremity Assessment Lower Extremity Assessment: Overall WFL for tasks assessed   Cervical / Trunk Assessment Cervical / Trunk Assessment: Other  exceptions (increaesd body habitus)   Communication Communication Communication: No difficulties   Cognition Arousal/Alertness: Awake/alert Behavior During Therapy: WFL for tasks assessed/performed  Cognition appears WFL; states she sometimes has difficulty with memory - not an acute problem. 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 Pt states she is scared of having another stroke. @ of her nephews [address] died from A-fib complications    Exercises     Shoulder Instructions      Home Living Family/patient expects to be discharged to:: Private residence Living Arrangements: Children;Other relatives Available Help at Discharge: Available 24 hours/day Type of Home: Apartment Home Access: Level entry     Home Layout: One level     Bathroom Shower/Tub: Chief Strategy Officer: Handicapped height Bathroom Accessibility: Yes How Accessible: Accessible via walker Home Equipment: Walker - 2 wheels;Cane - single point          Prior Functioning/Environment Level of Independence: Independent                 OT Problem List: Obesity      OT Treatment/Interventions:      OT Goals(Current goals can be found in the care plan section) Acute Rehab OT Goals Patient Stated Goal: to be healthier OT Goal Formulation: All assessment and education complete, DC therapy  OT Frequency:     Barriers to D/C:            Co-evaluation              AM-PAC OT "6 Clicks" Daily Activity     Outcome Measure Help from another person eating meals?: None Help from another person taking care of personal grooming?: None Help from another person toileting, which includes using toliet, bedpan, or urinal?: None Help from another person bathing (including washing, rinsing, drying)?: None Help from another person to put on and taking off regular upper body clothing?: None Help from another person to put on and taking off regular lower body clothing?: None 6 Click Score: 24   End of Session Nurse Communication: Mobility status (need for vestibular eval)  Activity Tolerance: Patient tolerated treatment well Patient left: in chair;with call bell/phone within reach  OT Visit Diagnosis: Dizziness and giddiness (R42)                Time:  3220-2542 OT Time Calculation (min): 37 min Charges:  OT General Charges $OT Visit: 1 Visit OT Evaluation $OT Eval Low Complexity: 1 Low OT Treatments $Self Care/Home Management : 8-22 mins  Luisa Dago, OT/L   Acute OT Clinical Specialist Acute Rehabilitation Services Pager [phone] Office [phone]   [facility] 03/01/2020, 10:03 AM

## 2020-03-01 NOTE — Evaluation (Signed)
Speech Language Pathology Evaluation Patient Details Name: Regina Christensen MRN: [id number] DOB: [birth year] Today's Date: 03/01/2020 Time: 7408-1448 SLP Time Calculation (min) (ACUTE ONLY): 17 min  Problem List:  Patient Active Problem List   Diagnosis Date Noted  . Stroke (HCC) 02/29/2020  . Headache disorder 12/20/2019  . Leukocytosis 07/19/2019  . History of hysterectomy 07/19/2019  . Drowsy 07/19/2019  . Anxiety 07/19/2019  . MDD (major depressive disorder), recurrent, in full remission (HCC) 07/19/2019  . 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 History of total knee arthroplasty 06/06/2019  . A-fib (HCC) 02/08/2019  . Atrial fibrillation with rapid ventricular response (HCC) 02/07/2019  . Atrial fibrillation, new onset (HCC) 02/03/2019  . Bilateral carpal tunnel syndrome 07/30/2016  . OSA (obstructive sleep apnea) 03/07/2016  . MDD (major depressive disorder), recurrent episode, mild (HCC) 11/30/2015  . Osteoarthritis of both knees 11/30/2015  . BPPV (benign paroxysmal positional vertigo) 08/28/2015  . Benign essential HTN 11/09/2014  . Hyperglycemia 11/09/2014  . GAD (generalized anxiety disorder) 11/09/2014  . Acanthosis nigricans 11/09/2014  . Morbid obesity (HCC) 01/06/2012   Past Medical History:  Past Medical History:  Diagnosis Date  . Arthritis   . Atrial fibrillation (HCC)   . Depression   . Hypertension   . Morbid obesity with BMI of 45.0-49.9, adult (HCC)   . Obesity   . Sleep apnea    Past Surgical History:  Past Surgical History:  Procedure Laterality Date  . ABDOMINAL HYSTERECTOMY    . BREAST EXCISIONAL BIOPSY Left   . ENDOMETRIAL ABLATION     x2  . GASTRIC BYPASS     sleeve  . TOTAL KNEE ARTHROPLASTY Right    HPI:  Patient had an episode of vertigo in September and then a second episode about 2 weeks ago with associated headache.  She was seen by ENT, diagnosed with sinus disease and plans were made for surgery in the next week.  She  was referred to neurology as an outpatient for evaluation of vertigo, outpatient MRI on 02/29/20 was obtained which revealed acute stroke and thus she was sent to the emergency department.  MRA on 03/01/20 indicated Normal MRA of the head and neck  Assessment / Plan / Recommendation Clinical Impression   Pt was administered the SLUMS ([facility] Mental Status Examination) with the following score obtained: 25/30 with 27/30 being a normal score for this assessment.  Pt stated she has experienced memory recall issues in the past and chart review revealed sleep apnea with inconsistent use of CPAP which could contribute to this issue.   This was discussed within session as recall of 3/5 words noted without cues; with categorization cues; she was able to recall 4/5 words.  Simple Math calculation decreased, but other attention tasks [facility].  Pt's speech is 100% intelligible within complex conversation and she is oriented x4.  Auditory comprehension tasks within functional limits and verbal expression adequate as pt did not exhibit difficulty expressing thoughts and/or naming within categories, repetition and with divergent/convergent naming tasks.  Pt is at baseline level of functioning for memory, so ST not recommended at this time.  Thank you for this consult.    SLP Assessment  SLP Recommendation/Assessment: Patient does not need any further Speech Language Pathology Services SLP Visit Diagnosis: Attention and concentration deficit    Follow Up Recommendations  None    Frequency and Duration Other (Comment) (Evaluation only)         SLP Evaluation Cognition  Overall Cognitive  Status: Within Functional Limits for tasks assessed Arousal/Alertness: Awake/alert Orientation Level: Oriented X4 Attention: Sustained Sustained Attention: Appears intact Memory: Impaired Memory Impairment: Retrieval deficit Immediate Memory Recall: Sock;Blue;Bed Memory Recall Sock: Without Cue Memory Recall  Blue: Without Cue Memory Recall Bed: With Cue Awareness: Appears intact Problem Solving: Appears intact Safety/Judgment: Appears intact       Comprehension  Auditory Comprehension Overall Auditory Comprehension: Appears within functional limits for tasks assessed Yes/No Questions: Within Functional Limits Commands: Within Functional Limits Conversation: Complex Visual Recognition/Discrimination Discrimination: Within Function Limits Reading Comprehension Reading Status: Within funtional limits    Expression Expression Primary Mode of Expression: Verbal Verbal Expression Overall Verbal Expression: Appears within functional limits for tasks assessed Initiation: No impairment Level of Generative/Spontaneous Verbalization: Conversation Repetition: No impairment Naming: No impairment Pragmatics: No impairment Non-Verbal Means of Communication: Not applicable Written Expression Dominant Hand: Right Written Expression: Within Functional Limits   Oral / Motor  Oral Motor/Sensory Function Overall Oral Motor/Sensory Function: Within functional limits Motor Speech Overall Motor Speech: Appears within functional limits for tasks assessed Respiration: Within functional limits Phonation: Normal Resonance: Within functional limits Articulation: Within functional limitis Intelligibility: Intelligible Motor Planning: Within functional limits Motor Speech Errors: Not applicable                      Tressie Stalker, M.S., CCC-SLP 03/01/2020, 1:00 PM

## 2020-03-01 NOTE — Progress Notes (Signed)
STROKE TEAM PROGRESS NOTE   INTERVAL HISTORY I personally reviewed history of presenting illness, electronic medical records and imaging films in PACS.  Patient has had transient intermittent symptoms of dizziness for which she was evaluated by ENT and found to have severe paranasal sinusitis for which she was planning on having elective surgery.  MRI scan ordered as part of this work-up shows a incidental right frontal infarct 7 x 3 mm likely lacunar however actual films are not available for my review.  The patient denies any focal neurological symptoms suggestive of stroke related to this finding.  She however does have vascular risk factors in the form of hypertension, morbid obesity and sleep apnea.  MR angiogram of the brain and neck did not show any large vessel stenosis or occlusion.  LDL cholesterol is 49 mg percent.  Hemoglobin A1c 6.2.  Vitals:   03/01/20 0132 03/01/20 0230 03/01/20 0314 03/01/20 0802  BP:  (!) 160/91 (!) 147/67 (!) 159/82  Pulse:  78 73 75  Resp:  16 18 18   Temp:   98 F (36.7 C) 98 F (36.7 C)  TempSrc:   Oral Oral  SpO2:  98% 99% 99%  Height: 5\' 8"  (1.727 m)      CBC:  Recent Labs  Lab 02/29/20 1212  WBC 8.3  NEUTROABS 5.6  HGB 14.1  HCT 44.4  MCV 92.5  PLT 303   Basic Metabolic Panel:  Recent Labs  Lab 02/29/20 1212  NA 139  K 3.5  CL 105  CO2 26  GLUCOSE 84  BUN 9  CREATININE 0.76  CALCIUM 9.1   Lipid Panel:  Recent Labs  Lab 03/01/20 0306  CHOL 132  TRIG 69  HDL 69  CHOLHDL 1.9  VLDL 14  LDLCALC 49   HgbA1c:  Recent Labs  Lab 03/01/20 0306  HGBA1C 6.2*   Urine Drug Screen: No results for input(s): LABOPIA, COCAINSCRNUR, LABBENZ, AMPHETMU, THCU, LABBARB in the last 168 hours.  Alcohol Level  Recent Labs  Lab 03/01/20 0306  ETH <10    IMAGING past 24 hours MR ANGIO HEAD WO CONTRAST  Result Date: 03/01/2020 CLINICAL DATA:  Stroke follow-up EXAM: MRA NECK WITHOUT AND WITH CONTRAST MRA HEAD WITHOUT CONTRAST TECHNIQUE:  Multiplanar and multiecho pulse sequences of the neck were obtained without and with intravenous contrast. Angiographic images of the neck were obtained using MRA technique without and with intravenous contrast.; Angiographic images of the Circle of Willis were obtained using MRA technique without intravenous contrast. CONTRAST:  67mL GADAVIST GADOBUTROL 1 MMOL/ML IV SOLN COMPARISON:  None. FINDINGS: MRA NECK FINDINGS Normal carotid and vertebral artery systems. MRA HEAD FINDINGS POSTERIOR CIRCULATION: --Vertebral arteries: Normal --Inferior cerebellar arteries: Normal. --Basilar artery: Normal. --Superior cerebellar arteries: Normal. --Posterior cerebral arteries: Normal. ANTERIOR CIRCULATION: --Intracranial internal carotid arteries: Normal. --Anterior cerebral arteries (ACA): Normal. --Middle cerebral arteries (MCA): Normal. ANATOMIC VARIANTS: Basilar artery fenestration. IMPRESSION: Normal MRA of the head and neck. Electronically Signed   By: 13/02/2020 M.D.   On: 03/01/2020 02:28   MR ANGIO NECK W WO CONTRAST  Result Date: 03/01/2020 CLINICAL DATA:  Stroke follow-up EXAM: MRA NECK WITHOUT AND WITH CONTRAST MRA HEAD WITHOUT CONTRAST TECHNIQUE: Multiplanar and multiecho pulse sequences of the neck were obtained without and with intravenous contrast. Angiographic images of the neck were obtained using MRA technique without and with intravenous contrast.; Angiographic images of the Circle of Willis were obtained using MRA technique without intravenous contrast. CONTRAST:  30mL GADAVIST GADOBUTROL 1 MMOL/ML  IV SOLN COMPARISON:  None. FINDINGS: MRA NECK FINDINGS Normal carotid and vertebral artery systems. MRA HEAD FINDINGS POSTERIOR CIRCULATION: --Vertebral arteries: Normal --Inferior cerebellar arteries: Normal. --Basilar artery: Normal. --Superior cerebellar arteries: Normal. --Posterior cerebral arteries: Normal. ANTERIOR CIRCULATION: --Intracranial internal carotid arteries: Normal. --Anterior cerebral  arteries (ACA): Normal. --Middle cerebral arteries (MCA): Normal. ANATOMIC VARIANTS: Basilar artery fenestration. IMPRESSION: Normal MRA of the head and neck. Electronically Signed   By: Deatra Robinson M.D.   On: 03/01/2020 02:28    PHYSICAL EXAM Pleasant mildly obese middle-aged African-American lady not in distress. . Afebrile. Head is nontraumatic. Neck is supple without bruit.    Cardiac exam no murmur or gallop. Lungs are clear to auscultation. Distal pulses are well felt. Neurological Exam ;  Awake  Alert oriented x 3. Normal speech and language.eye movements full without nystagmus.fundi were not visualized. Vision acuity and fields appear normal. Hearing is normal. Palatal movements are normal. Face symmetric. Tongue midline. Normal strength, tone, reflexes and coordination. Normal sensation. Gait deferred.  ASSESSMENT/PLAN Ms. LEEA RAMBEAU is a 64 y.o. female with history of AF on AC with recent missed doses, HTN, depression, arthritis, morbid obeisty presenting following a MRI done for vertigo which showed incidental infarct.   Stroke:   Small posterior frontal lobe infarct embolic secondary to known AF on AC with recent missed doses  OP MRI done at [facility] in [place] for vertigo w/ incidental small posterior frontal lobe infarct   MRA head & neck Unremarkable   2D Echo EF 55-60%. No source of embolus   LDL 49  HgbA1c 6.2  VTE prophylaxis - Eliquis  Eliquis (apixaban) daily prior to admission, now on Eliquis (apixaban) daily. Continue at d/c  Therapy recommendations:  No PT, no OT  Disposition:  pending   Atrial Fibrillation  Home anticoagulation:  Eliquis (apixaban) daily   Missed 2 doses of eliquis over past 1 week . Continue Eliquis (apixaban) daily at discharge   Hypertension  Stable . Permissive hypertension (OK if < 220/120) but gradually normalize in 5-7 days . Long-term BP goal normotensive  Pre-Diabetes   HgbA1c 6.2, goal < 7.0  Other Stroke Risk  Factors  Morbid Obesity, Body mass index is 44.05 kg/m., BMI >/= 30 associated with increased stroke risk, recommend weight loss, diet and exercise as appropriate   Obstructive sleep apnea, on CPAP at home but does not routinely wear "I fight with it" - to follow up with prescribing MD   Other Active Problems  Vertigo - PT for vestibular check  Anxiety   Hospital day # 0 Patient had a silent right frontal infarct likely due to noncompliance with Eliquis for her A. fib.  Patient counseled to be compliant with Eliquis.  Aggressive risk factor modification.  She was counseled to use her CPAP for sleep apnea.  Physical therapy to evaluate vestibular rehab given recent complaints of positional dizziness.  Discussed with patient and Dr. Frederick Peers.  Greater than 50% time during this 25-minute visit was spent on counseling and coordination of care about her silent infarct and dizziness and answering questions. Delia Heady, MD To contact Stroke Continuity provider, please refer to [email or web address]. After hours, contact General Neurology

## 2020-03-01 NOTE — Assessment & Plan Note (Addendum)
-   she has no deficits aside from vertigo which initially started in Sept so likely unrelated to findings on MRI (small right posterior frontal lobe infarct) -Vertigo has some positional traits according to patient description -Continue Eliquis -Echo negative for any shunting or thrombus

## 2020-03-01 NOTE — Plan of Care (Signed)
  Problem: Education: Goal: Knowledge of secondary prevention will improve Outcome: Progressing   Problem: Coping: Goal: Will verbalize positive feelings about self Outcome: Progressing   

## 2020-03-01 NOTE — Assessment & Plan Note (Addendum)
-  Patient describes vertigo being precipitated by certain positions or changes in positions at times -Follow-up vestibular work-up; no inpatient needs -Supportive care recommended with referral to outpatient vestibular rehab if persists

## 2020-03-01 NOTE — Hospital Course (Addendum)
Ms. Strathman is a 64 yo female with PMH PAF (on Eliquis), chronic left sinus infection, hypertension, depression, arthritis, obesity who presented to the ER after an outpatient MRI revealed a stroke.  She has been having some vertigo since September but only states approximately 2 episodes total since then.  She has also been following outpatient with ENT with plans for surgery on her left sinus which would require holding her Eliquis.  Due to her vertigo, she was referred to neurology outpatient and underwent the MRI which revealed her stroke.  She states she misses approximately 2 or 3 doses of her Eliquis per week due to falling asleep before her evening dose. MRI brain was not repeated in the ER/on admission however care everywhere review of the report noted a 7 x 3 mm infarct involving the right posterior frontal lobe. Some of her vertigo appeared to be positional in nature.  She was evaluated by PT, OT, SLP during hospitalization with no acute needs.  She was referred for further vestibular work-up during hospitalization.  She was found to have no acute inpatient needs for her vertigo.  She was recommended to continue observation of this at discharge and can have further referral for outpatient vestibular rehab if needed. Her stroke was considered essentially silent in nature possibly due to recent missed doses of Eliquis.  Ongoing compliance was recommended and patient voiced understanding. She was considered stable for discharging home with ongoing routine outpatient follow-up.

## 2020-03-01 NOTE — Assessment & Plan Note (Addendum)
-   continue qhs CPAP; patient not very compliant at home but understands the recommendation

## 2020-03-01 NOTE — Assessment & Plan Note (Signed)
-  Permissive hypertension being allowed initially -At home on amlodipine 2.5 mg daily, clonidine patch weekly, and lopressor 100 BID

## 2020-03-02 ENCOUNTER — Encounter: Payer: Self-pay | Admitting: Internal Medicine

## 2020-03-02 DIAGNOSIS — I639 Cerebral infarction, unspecified: Secondary | ICD-10-CM | POA: Diagnosis not present

## 2020-03-02 LAB — CBC WITH DIFFERENTIAL/PLATELET
Abs Immature Granulocytes: 0.02 10*3/uL (ref 0.00–0.07)
Basophils Absolute: 0.1 10*3/uL (ref 0.0–0.1)
Basophils Relative: 1 %
Eosinophils Absolute: 0.1 10*3/uL (ref 0.0–0.5)
Eosinophils Relative: 2 %
HCT: 41.4 % (ref 36.0–46.0)
Hemoglobin: 13.4 g/dL (ref 12.0–15.0)
Immature Granulocytes: 0 %
Lymphocytes Relative: 30 %
Lymphs Abs: 2.4 10*3/uL (ref 0.7–4.0)
MCH: 29.2 pg (ref 26.0–34.0)
MCHC: 32.4 g/dL (ref 30.0–36.0)
MCV: 90.2 fL (ref 80.0–100.0)
Monocytes Absolute: 0.6 10*3/uL (ref 0.1–1.0)
Monocytes Relative: 8 %
Neutro Abs: 4.8 10*3/uL (ref 1.7–7.7)
Neutrophils Relative %: 59 %
Platelets: 279 10*3/uL (ref 150–400)
RBC: 4.59 MIL/uL (ref 3.87–5.11)
RDW: 14.5 % (ref 11.5–15.5)
WBC: 7.9 10*3/uL (ref 4.0–10.5)
nRBC: 0 % (ref 0.0–0.2)

## 2020-03-02 LAB — BASIC METABOLIC PANEL
Anion gap: 9 (ref 5–15)
BUN: 10 mg/dL (ref 8–23)
CO2: 25 mmol/L (ref 22–32)
Calcium: 8.9 mg/dL (ref 8.9–10.3)
Chloride: 105 mmol/L (ref 98–111)
Creatinine, Ser: 0.77 mg/dL (ref 0.44–1.00)
GFR, Estimated: >60 mL/min (ref >60)
Glucose, Bld: 105 mg/dL — ABNORMAL HIGH (ref 70–99)
Potassium: 3.2 mmol/L — ABNORMAL LOW (ref 3.5–5.1)
Sodium: 139 mmol/L (ref 135–145)

## 2020-03-02 LAB — MAGNESIUM: Magnesium: 2.2 mg/dL (ref 1.7–2.4)

## 2020-03-02 MED ORDER — POTASSIUM CHLORIDE CRYS ER 20 MEQ PO TBCR
40.0000 meq | EXTENDED_RELEASE_TABLET | Freq: Once | ORAL | Status: AC
  Administered 2020-03-02: 40 meq via ORAL
  Filled 2020-03-02: qty 2

## 2020-03-02 NOTE — Progress Notes (Signed)
Physical Therapy Treatment Patient Details Name: Regina Christensen MRN: [id number] DOB: [birth year] Today's Date: 03/02/2020    History of Present Illness 64 year old woman PMH including atrial fibrillation on apixaban presented to the emergency department after an outpatient MRI found a small stroke. Patient had an episode of vertigo in September and then a second episode about 2 weeks ago with associated headache. PMH: arthrtis, A-fib, depression, HTN, morbid obesity, sleep apnea, R TKA, gastric bypass sx.     PT Comments    Focused session on reassessing posterior canals off end of bed rather than with bed in trendelenburg position and on reviewng her HEP. No nystagmus was noted with repeated Gilberto Better tests to each side, but she did report dizziness when returning to sit each time. Performed and reviewed seated vertical and horizontal gaze stability HEP, with good understanding noted. Will continue to follow acutely.    Follow Up Recommendations  No PT follow up     Equipment Recommendations  None recommended by PT    Recommendations for Other Services       Precautions / Restrictions Precautions Precautions: Other (comment) (1 vertigo episode in last month) Precaution Comments: h/o vertigo Restrictions Weight Bearing Restrictions: No    Mobility  Bed Mobility Overal bed mobility: Independent             General bed mobility comments: No use of rails with bed flat with pt able to transition <> sit with independence safely.  Transfers                 General transfer comment: Did not transfer due to not focus of session  Ambulation/Gait             General Gait Details: Did not ambulate, as it was not the focus of the session   Stairs             Wheelchair Mobility    Modified Rankin (Stroke Patients Only) Modified Rankin (Stroke Patients Only) Pre-Morbid Rankin Score: No symptoms Modified Rankin: No significant disability      Balance Overall balance assessment: No apparent balance deficits (not formally assessed)                                          Cognition Arousal/Alertness: Awake/alert Behavior During Therapy: WFL for tasks assessed/performed Overall Cognitive Status: Within Functional Limits for tasks assessed                                 General Comments: Pt pleasant and able to follow all directions without difficulty this date.      Exercises Other Exercises Other Exercises: x1 seated vertical and horizontal gaze stability exercises given with handout.  Other Exercises: x2 Dix hallpike exam performed to each side, no noted nystagmus but symptoms of dizziness noted upon return to sit with each    General Comments        Pertinent Vitals/Pain Pain Assessment: No/denies pain    Home Living                      Prior Function            PT Goals (current goals can now be found in the care plan section) Acute Rehab PT Goals Patient Stated Goal: to decrease her vertigo  PT Goal Formulation: With patient Time For Goal Achievement: 03/15/20 Potential to Achieve Goals: Good Progress towards PT goals: Progressing toward goals    Frequency    Min 4X/week      PT Plan Current plan remains appropriate    Co-evaluation              AM-PAC PT "6 Clicks" Mobility   Outcome Measure  Help needed turning from your back to your side while in a flat bed without using bedrails?: None Help needed moving from lying on your back to sitting on the side of a flat bed without using bedrails?: None Help needed moving to and from a bed to a chair (including a wheelchair)?: None Help needed standing up from a chair using your arms (e.g., wheelchair or bedside chair)?: None Help needed to walk in hospital room?: None Help needed climbing 3-5 steps with a railing? : None 6 Click Score: 24    End of Session   Activity Tolerance: Patient tolerated  treatment well Patient left: in bed;with call bell/phone within reach   PT Visit Diagnosis: Dizziness and giddiness (R42)     Time: 9735-3299 PT Time Calculation (min) (ACUTE ONLY): 13 min  Charges:  $Therapeutic Exercise: 8-22 mins                     Raymond Gurney, PT, DPT Acute Rehabilitation Services  Pager: [phone] Office: [phone]    Jewel Baize 03/02/2020, 3:51 PM

## 2020-03-02 NOTE — TOC Transition Note (Signed)
Transition of Care [facility]) - CM/SW Discharge Note   Patient Details  Name: Regina Christensen MRN: [id number] Date of Birth: 09-Apr-1956  Transition of Care [facility] CM/SW Contact:  Kermit Balo, RN Phone Number: 03/02/2020, 10:31 AM   Clinical Narrative:    Pt is discharging home with self care. No needs per TOC.   Final next level of care: Home/Self Care Barriers to Discharge: No Barriers Identified   Patient Goals and CMS Choice        Discharge Placement                       Discharge Plan and Services                                     Social Determinants of Health (SDOH) Interventions     Readmission Risk Interventions No flowsheet data found.

## 2020-03-02 NOTE — Progress Notes (Signed)
Discharge instructions (including medications) discussed with and copy provided to patient/caregiver 

## 2020-03-02 NOTE — Plan of Care (Signed)
  Problem: Education: Goal: Knowledge of disease or condition will improve Outcome: Adequate for Discharge Goal: Knowledge of secondary prevention will improve Outcome: Adequate for Discharge Goal: Knowledge of patient specific risk factors addressed and post discharge goals established will improve Outcome: Adequate for Discharge Goal: Individualized Educational Video(s) Outcome: Adequate for Discharge   

## 2020-03-02 NOTE — Progress Notes (Signed)
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  2. 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  3. 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    During hospital stay at [facility]   MR Angio neck and head with and without contrast was normal   Potassium was a little low and was replaced before discharge  Lipid panel showed LD at [facility] at 49  A1C was 6.2 %  Echo was negative for thrombus, mild diastolic dysfunction   She was found to not be very compliant with Eliquis and was advised to take it daily to avoid more strokes.   Vertigo: she was diagnosed with BPPV previously had Epley maneuver, but symptoms have been more intense and was seen by ENT and neurologist , she is going to vestibular rehab.  Recurrent sinusitis: she was supposed to have sinus surgery this week, but it was postponed due to recent stroke   Afib: she states since she has been on higher dose of Metoprolol at 100 mg BIDshe states episodes of palpitation  are mild and seldom now, she was forgetting  to take Eliquis but since stroke on 11/09/2021she has been taking it daily, no bleeding or side effects   MDD/GAD: she is seeing Dr. Elna Breslow and also therapist. 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 She is compliant with medication and is taking down again to Effexor 150  mg daily.Lives with daughter, two grand-daughter and son in Social worker. She feels supported by her family. She still gets frustrated at work but coping okay     Insomnia: she states Dr. Elna Breslow, she was given rx of Rozerem but has not started  yet, difficulty falling and staying asleep   HTN: she is only on lopressor and clonidine patch,  norvasc 2.5 mg , bp is slightly up today, but since recent stroke we will leave like it is, goal is below 135/80, she will return for bp check only in one to two weeks  Morbid obesity: she states difficulty exercising because of OA , had right knee surgery but still has pain on left side. She lost another lbs since last visit. She states she has been walking more   OA knees: had surgery right knee, TKR and will go for left knee replacement before the end of this year. Doing better with right side, still has some pain going up and down stairs, left knee is weaker and has grinding sensation, she will see Ortho - Dr. Odis Luster . Unchanged   Trigeminal neuralgia. She used to take tegretol in the past and needs a refill, pain is shooting and had been gone for years but had some pain end of December 2020 , no flares in months. She stopped lyrica on her own, no symptoms at this time   Pre-diabetes: she is on Ozempic, last A1C was 6.2. % during hospital stsay., she denies polyphagia, polyuria or polydipsia     Patient Active Problem List   Diagnosis Date Noted  . History of CVA (cerebrovascular accident) without residual deficits 03/05/2020  .  Vertigo 03/01/2020  . Acute CVA (cerebrovascular accident) (HCC) 02/29/2020  . Headache disorder 12/20/2019  . Leukocytosis 07/19/2019  . History of hysterectomy 07/19/2019  . Drowsy 07/19/2019  . Anxiety 07/19/2019  . MDD (major depressive disorder), recurrent, in full remission (HCC) 07/19/2019  . 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 History of total knee arthroplasty 06/06/2019  . A-fib (HCC) 02/08/2019  . Atrial fibrillation with rapid ventricular response (HCC) 02/07/2019  . Atrial fibrillation, new onset (HCC) 02/03/2019  . Bilateral carpal tunnel syndrome 07/30/2016  . OSA (obstructive sleep apnea) 03/07/2016  . MDD (major depressive  disorder), recurrent episode, mild (HCC) 11/30/2015  . Osteoarthritis of both knees 11/30/2015  . BPPV (benign paroxysmal positional vertigo) 08/28/2015  . Benign essential HTN 11/09/2014  . Hyperglycemia 11/09/2014  . GAD (generalized anxiety disorder) 11/09/2014  . Acanthosis nigricans 11/09/2014  . Morbid obesity (HCC) 01/06/2012    Past Surgical History:  Procedure Laterality Date  . ABDOMINAL HYSTERECTOMY    . BREAST EXCISIONAL BIOPSY Left   . ENDOMETRIAL ABLATION     x2  . GASTRIC BYPASS     sleeve  . TOTAL KNEE ARTHROPLASTY Right     Family History  Problem Relation Age of Onset  . Heart disease Father   . Cancer Brother   . Mental illness Neg Hx     Social History   Tobacco Use  . Smoking status: Never Smoker  . Smokeless tobacco: Never Used  Substance Use Topics  . Alcohol use: No    Alcohol/week: 0.0 standard drinks    Comment: rare     Current Outpatient Medications:  .  acetaminophen (TYLENOL 8 HOUR ARTHRITIS PAIN) 650 MG CR tablet, Take 1 tablet (650 mg total) by mouth every 8 (eight) hours as needed for pain. (Patient taking differently: Take 1,300 mg by mouth every 8 (eight) hours as needed for pain. ), Disp: 90 tablet, Rfl: 0 .  amLODipine (NORVASC) 2.5 MG tablet, Take 1 tablet (2.5 mg total) by mouth daily., Disp: 90 tablet, Rfl: 0 .  apixaban (ELIQUIS) 5 MG TABS tablet, Take 1 tablet (5 mg total) by mouth 2 (two) times daily., Disp: 180 tablet, Rfl: 2 .  azelastine (ASTELIN) 0.1 % nasal spray, PLACE 2 SPRAYS INTO BOTH NOSTRILS 2 (TWO) TIMES DAILY (Patient taking differently: Place 2 sprays into both nostrils 2 (two) times daily. ), Disp: 30 mL, Rfl: 1 .  cloNIDine (CATAPRES - DOSED IN MG/24 HR) 0.1 mg/24hr patch, One weekly, Disp: 12 patch, Rfl: 1 .  famotidine (PEPCID) 20 MG tablet, Take 1 tablet (20 mg total) by mouth daily., Disp: 90 tablet, Rfl: 0 .  fexofenadine (ALLEGRA) 180 MG tablet, Take 180 mg by mouth daily., Disp: , Rfl:  .  meclizine  (ANTIVERT) 12.5 MG tablet, Take 1 tablet (12.5 mg total) by mouth 3 (three) times daily as needed for dizziness., Disp: 30 tablet, Rfl: 0 .  metoprolol tartrate (LOPRESSOR) 100 MG tablet, Take 1 tablet (100 mg total) by mouth 2 (two) times daily., Disp: 180 tablet, Rfl: 3 .  ramelteon (ROZEREM) 8 MG tablet, Take 1 tablet (8 mg total) by mouth at bedtime., Disp: 30 tablet, Rfl: 1 .  Semaglutide, 1 MG/DOSE, (OZEMPIC, 1 MG/DOSE,) 2 MG/1.5ML SOPN, Inject 1 mg into the skin once a week. Sunday, Disp: 9 mL, Rfl: 1 .  venlafaxine XR (EFFEXOR-XR) 150 MG 24 hr capsule, TAKE 1 CAPSULE  DAILY WITH BREAKFAST (Patient taking differently: Take 150 mg by mouth daily with breakfast.  TAKE 1 CAPSULE  DAILY WITH BREAKFAST), Disp: 90 capsule, Rfl: 0 .  Vitamin D, Ergocalciferol, (DRISDOL) 1.25 MG (50000 UNIT) CAPS capsule, Take 50,000 Units by mouth once a week. Monday, Disp: , Rfl:  .  XIIDRA 5 % SOLN, Place 1 drop into both eyes 2 (two) times daily., Disp: , Rfl: 4  Allergies  Allergen Reactions  . Ace Inhibitors Cough  . Hctz [Hydrochlorothiazide] Rash    face    I personally reviewed active problem list, medication list, allergies, family history, social history, health maintenance with the patient/caregiver today.   ROS  Constitutional: Negative for fever or weight change.  Respiratory: Negative for cough and shortness of breath.   Cardiovascular: Negative for chest pain , positive for intermittent  palpitations.  Gastrointestinal: Negative for abdominal pain, no bowel changes.  Musculoskeletal: Positive  for gait problem and intermittent  joint swelling.  Skin: Negative for rash.  Neurological: positive  for dizziness but no  headache.  No other specific complaints in a complete review of systems (except as listed in HPI above).  Objective  Vitals:   03/05/20 0755  BP: 140/88  Pulse: 89  Resp: 16  Temp: 97.8 F (36.6 C)  TempSrc: Oral  SpO2: 100%  Weight: 285 lb 8 oz (129.5 kg)  Height: 5'  8" (1.727 m)    Body mass index is 43.41 kg/m.  Physical Exam  Constitutional: Patient appears well-developed and well-nourished. Obese  No distress.  HEENT: head atraumatic, normocephalic, pupils equal and reactive to light,  neck supple Cardiovascular: Normal rate, regular rhythm and normal heart sounds.  No murmur heard. No BLE edema. Pulmonary/Chest: Effort normal and breath sounds normal. No respiratory distress. Abdominal: Soft.  There is no tenderness. Neurologist: no focal deficit, cranial nerves intake, romberg negative  Muscular Skeletal: crepitus with extension of both knees, mild effusion  Psychiatric: Patient has a normal mood and affect. behavior is normal. Judgment and thought content normal.  Recent Results (from the past 2160 hour(s))  Protime-INR     Status: None   Collection Time: 02/29/20 12:12 PM  Result Value Ref Range   Prothrombin Time 14.2 11.4 - 15.2 seconds   INR 1.1 0.8 - 1.2    Comment: (NOTE) INR goal varies based on device and disease states. Performed at [facility], [address] [place], [place] [address]   APTT     Status: Abnormal   Collection Time: 02/29/20 12:12 PM  Result Value Ref Range   aPTT 37 (H) 24 - 36 seconds    Comment:        IF BASELINE aPTT IS ELEVATED, SUGGEST PATIENT RISK ASSESSMENT BE USED TO DETERMINE APPROPRIATE ANTICOAGULANT THERAPY. Performed at [facility], [address], [place], [place] [address]   CBC     Status: None   Collection Time: 02/29/20 12:12 PM  Result Value Ref Range   WBC 8.3 4.0 - 10.5 K/uL   RBC 4.80 3.87 - 5.11 MIL/uL   Hemoglobin 14.1 12.0 - 15.0 g/dL   HCT 50.0 36 - 46 %   MCV 92.5 80.0 - 100.0 fL   MCH 29.4 26.0 - 34.0 pg   MCHC 31.8 30.0 - 36.0 g/dL   RDW 93.8 18.2 - 99.3 %   Platelets 303 150 - 400 K/uL   nRBC 0.0 0.0 - 0.2 %    Comment: Performed at [facility], [address], [place], [place] [address]  Differential  Status: None   Collection Time:  02/29/20 12:12 PM  Result Value Ref Range   Neutrophils Relative % 67 %   Neutro Abs 5.6 1.7 - 7.7 K/uL   Lymphocytes Relative 24 %   Lymphs Abs 2.0 0.7 - 4.0 K/uL   Monocytes Relative 7 %   Monocytes Absolute 0.6 0.1 - 1.0 K/uL   Eosinophils Relative 1 %   Eosinophils Absolute 0.1 0.0 - 0.5 K/uL   Basophils Relative 1 %   Basophils Absolute 0.0 0.0 - 0.1 K/uL   Immature Granulocytes 0 %   Abs Immature Granulocytes 0.03 0.00 - 0.07 K/uL    Comment: Performed at [facility], [address] [place], [place] [address]  Comprehensive metabolic panel     Status: Abnormal   Collection Time: 02/29/20 12:12 PM  Result Value Ref Range   Sodium 139 135 - 145 mmol/L   Potassium 3.5 3.5 - 5.1 mmol/L   Chloride 105 98 - 111 mmol/L   CO2 26 22 - 32 mmol/L   Glucose, Bld 84 70 - 99 mg/dL    Comment: Glucose reference range applies only to samples taken after fasting for at least 8 hours.   BUN 9 8 - 23 mg/dL   Creatinine, Ser 3.29 0.44 - 1.00 mg/dL   Calcium 9.1 8.9 - 51.8 mg/dL   Total Protein 7.0 6.5 - 8.1 g/dL   Albumin 3.4 (L) 3.5 - 5.0 g/dL   AST 14 (L) 15 - 41 U/L   ALT 13 0 - 44 U/L   Alkaline Phosphatase 79 38 - 126 U/L   Total Bilirubin 0.4 0.3 - 1.2 mg/dL   GFR, Estimated >84 >16 mL/min    Comment: (NOTE) Calculated using the CKD-EPI Creatinine Equation (2021)    Anion gap 8 5 - 15    Comment: Performed at [facility], [address], [place], [place] [address]  Respiratory Panel by RT PCR (Flu A&B, Covid) - Nasopharyngeal Swab     Status: None   Collection Time: 02/29/20  5:25 PM   Specimen: Nasopharyngeal Swab  Result Value Ref Range   SARS Coronavirus 2 by RT PCR NEGATIVE NEGATIVE    Comment: (NOTE) SARS-CoV-2 target nucleic acids are NOT DETECTED.  The SARS-CoV-2 RNA is generally detectable in upper respiratoy specimens during the acute phase of infection. The lowest concentration of SARS-CoV-2 viral copies this assay can detect is 131 copies/mL. A  negative result does not preclude SARS-Cov-2 infection and should not be used as the sole basis for treatment or other patient management decisions. A negative result may occur with  improper specimen collection/handling, submission of specimen other than nasopharyngeal swab, presence of viral mutation(s) within the areas targeted by this assay, and inadequate number of viral copies (<131 copies/mL). A negative result must be combined with clinical observations, patient history, and epidemiological information. The expected result is Negative.  Fact Sheet for Patients:  https://www.moore.com/  Fact Sheet for Healthcare Providers:  https://www.young.biz/  This test is no t yet approved or cleared by the [place] FDA and  has been authorized for detection and/or diagnosis of SARS-CoV-2 by FDA under an Emergency Use Authorization (EUA). This EUA will remain  in effect (meaning this test can be used) for the duration of the COVID-19 declaration under Section 564(b)(1) of the Act, 21 U.S.C. section 360bbb-3(b)(1), unless the authorization is terminated or revoked sooner.     Influenza A by PCR NEGATIVE NEGATIVE   Influenza B by  PCR NEGATIVE NEGATIVE    Comment: (NOTE) The Xpert Xpress SARS-CoV-2/FLU/RSV assay is intended as an aid in  the diagnosis of influenza from Nasopharyngeal swab specimens and  should not be used as a sole basis for treatment. Nasal washings and  aspirates are unacceptable for Xpert Xpress SARS-CoV-2/FLU/RSV  testing.  Fact Sheet for Patients: https://www.moore.com/  Fact Sheet for Healthcare Providers: https://www.young.biz/  This test is not yet approved or cleared by the [place] FDA and  has been authorized for detection and/or diagnosis of SARS-CoV-2 by  FDA under an Emergency Use Authorization (EUA). This EUA will remain  in effect (meaning this test can be used)  for the duration of the  Covid-19 declaration under Section 564(b)(1) of the Act, 21  U.S.C. section 360bbb-3(b)(1), unless the authorization is  terminated or revoked. Performed at [facility], [address], [place], [place] [address]   Ethanol     Status: None   Collection Time: 03/01/20  3:06 AM  Result Value Ref Range   Alcohol, Ethyl (B) <10 <10 mg/dL    Comment: (NOTE) Lowest detectable limit for serum alcohol is 10 mg/dL.  For medical purposes only. Performed at [facility], [address] [place], [place] [address]   TSH     Status: None   Collection Time: 03/01/20  3:06 AM  Result Value Ref Range   TSH 1.358 0.350 - 4.500 uIU/mL    Comment: Performed by a 3rd Generation assay with a functional sensitivity of <=0.01 uIU/mL. Performed at [facility], [address], [place], [place] [address]   HIV Antibody (routine testing w rflx)     Status: None   Collection Time: 03/01/20  3:06 AM  Result Value Ref Range   HIV Screen 4th Generation wRfx Non Reactive Non Reactive    Comment: Performed at [facility], [address] [place], [place] [address]  Hemoglobin A1c     Status: Abnormal   Collection Time: 03/01/20  3:06 AM  Result Value Ref Range   Hgb A1c MFr Bld 6.2 (H) 4.8 - 5.6 %    Comment: (NOTE) Pre diabetes:          5.7%-6.4%  Diabetes:              >6.4%  Glycemic control for   <7.0% adults with diabetes    Mean Plasma Glucose 131.24 mg/dL    Comment: Performed at [facility], [address], [place], [place] [address]  Lipid panel     Status: None   Collection Time: 03/01/20  3:06 AM  Result Value Ref Range   Cholesterol 132 0 - 200 mg/dL   Triglycerides 69 <706 mg/dL   HDL 69 >23 mg/dL   Total CHOL/HDL Ratio 1.9 RATIO   VLDL 14 0 - 40 mg/dL   LDL Cholesterol 49 0 - 99 mg/dL    Comment:        Total Cholesterol/HDL:CHD Risk Coronary Heart Disease Risk Table                     Men   Women  1/2 Average Risk    3.4   3.3  Average Risk       5.0   4.4  2 X Average Risk   9.6   7.1  3 X Average Risk  23.4   11.0        Use the calculated Patient Ratio above  and the CHD Risk Table to determine the patient's CHD Risk.        ATP III CLASSIFICATION (LDL):  <100     mg/dL   Optimal  161-096  mg/dL   Near or Above                    Optimal  130-159  mg/dL   Borderline  045-409  mg/dL   High  >811     mg/dL   Very High Performed at [facility], [address] [place], [place] [address]   ECHOCARDIOGRAM COMPLETE     Status: None   Collection Time: 03/01/20 10:33 AM  Result Value Ref Range   Height 68 in   BP 159/82 mmHg   S' Lateral 3.16 cm   Area-P 1/2 3.65 cm2  Basic metabolic panel     Status: Abnormal   Collection Time: 03/02/20  3:03 AM  Result Value Ref Range   Sodium 139 135 - 145 mmol/L   Potassium 3.2 (L) 3.5 - 5.1 mmol/L   Chloride 105 98 - 111 mmol/L   CO2 25 22 - 32 mmol/L   Glucose, Bld 105 (H) 70 - 99 mg/dL    Comment: Glucose reference range applies only to samples taken after fasting for at least 8 hours.   BUN 10 8 - 23 mg/dL   Creatinine, Ser 2.95 0.44 - 1.00 mg/dL   Calcium 8.9 8.9 - 62.1 mg/dL   GFR, Estimated >30 >86 mL/min    Comment: (NOTE) Calculated using the CKD-EPI Creatinine Equation (2021)    Anion gap 9 5 - 15    Comment: Performed at [facility], [address], [place], [place] [address]  CBC with Differential/Platelet     Status: None   Collection Time: 03/02/20  3:03 AM  Result Value Ref Range   WBC 7.9 4.0 - 10.5 K/uL   RBC 4.59 3.87 - 5.11 MIL/uL   Hemoglobin 13.4 12.0 - 15.0 g/dL   HCT 96.2 36 - 46 %   MCV 90.2 80.0 - 100.0 fL   MCH 29.2 26.0 - 34.0 pg   MCHC 32.4 30.0 - 36.0 g/dL   RDW 95.2 84.1 - 32.4 %   Platelets 279 150 - 400 K/uL   nRBC 0.0 0.0 - 0.2 %   Neutrophils Relative % 59 %   Neutro Abs 4.8 1.7 - 7.7 K/uL   Lymphocytes Relative 30 %   Lymphs Abs 2.4 0.7 - 4.0 K/uL   Monocytes Relative 8 %   Monocytes  Absolute 0.6 0.1 - 1.0 K/uL   Eosinophils Relative 2 %   Eosinophils Absolute 0.1 0.0 - 0.5 K/uL   Basophils Relative 1 %   Basophils Absolute 0.1 0.0 - 0.1 K/uL   Immature Granulocytes 0 %   Abs Immature Granulocytes 0.02 0.00 - 0.07 K/uL    Comment: Performed at [facility], [address], [place], [place] [address]  Magnesium     Status: None   Collection Time: 03/02/20  3:03 AM  Result Value Ref Range   Magnesium 2.2 1.7 - 2.4 mg/dL    Comment: Performed at [facility], [address], [place], [place] [address]      PHQ2/9: Depression screen [facility] 2/9 03/05/2020 11/29/2019 11/17/2019 11/03/2019 10/19/2019  Decreased Interest 0 0 0 0 0  Down, Depressed, Hopeless 0 0 0 0 0  PHQ - 2 Score 0 0 0 0 0  Altered sleeping - 0 0 0 0  Tired, decreased energy - 0 0 0 0  Change in appetite - 0 0 0 0  Feeling bad or failure about yourself  - 0 0 0 0  Trouble concentrating - 0 0 0 0  Moving slowly or fidgety/restless - 0 0 0 0  Suicidal thoughts - 0 0 0 0  PHQ-9 Score - 0 0 0 0  Difficult doing work/chores - - - Not difficult at all -  Some recent data might be hidden    phq 9 is negative   Fall Risk: Fall Risk  03/05/2020 11/29/2019 11/17/2019 11/03/2019 10/19/2019  Falls in the past year? 0 0 0 0 0  Number falls in past yr: 0 0 0 0 0  Injury with Fall? 0 0 0 0 0  Risk for fall due to : - - - - -  Follow up - - - - -     Functional Status Survey: Is the patient deaf or have difficulty hearing?: No Does the patient have difficulty seeing, even when wearing glasses/contacts?: No Does the patient have difficulty concentrating, remembering, or making decisions?: No Does the patient have difficulty walking or climbing stairs?: Yes Does the patient have difficulty dressing or bathing?: No Does the patient have difficulty doing errands alone such as visiting a doctor's office or shopping?: No    Assessment & Plan  1. History of CVA (cerebrovascular accident) without  residual deficits  Explained again importance of compliance  2. Need for immunization against influenza  - Flu Vaccine QUAD 36+ mos IM  3. Major depression in remission (HCC)   4. Essential hypertension  - amLODipine (NORVASC) 2.5 MG tablet; Take 1 tablet (2.5 mg total) by mouth daily.  Dispense: 90 tablet; Refill: 0 - cloNIDine (CATAPRES - DOSED IN MG/24 HR) 0.1 mg/24hr patch; One weekly  Dispense: 12 patch; Refill: 1  5. GAD (generalized anxiety disorder)   6. Metabolic syndrome  - Semaglutide, 1 MG/DOSE, (OZEMPIC, 1 MG/DOSE,) 2 MG/1.5ML SOPN; Inject 1 mg into the skin once a week. Sunday  Dispense: 9 mL; Refill: 1  7. Morbid obesity (HCC)  Discussed with the patient the risk posed by an increased BMI. Discussed importance of portion control, calorie counting and at least 150 minutes of physical activity weekly. Avoid sweet beverages and drink more water. Eat at least 6 servings of fruit and vegetables daily   8. OSA (obstructive sleep apnea)  Reminded her the importance of wearing it every night.   9. GERD without esophagitis  Controlled with medication  10. Recurrent sinusitis   11. Benign paroxysmal positional vertigo, unspecified laterality  Keep follow up with neurologist and vestibular rehab at home  12. Atrial fibrillation, unspecified type (HCC)  Rate controlled

## 2020-03-04 NOTE — Discharge Summary (Signed)
Physician Discharge Summary   CHENIKA NEVILS MVH:[id number] DOB: 1955/08/09 DOA: 02/29/2020  PCP: [name], MD  Admit date: 02/29/2020 Discharge date: 03/04/2020  Admitted From: Home Disposition: Home Discharging physician: Lewie Chamber, MD  Recommendations for Outpatient Follow-up:  1. Follow-up compliance with Eliquis 2. May need vestibular rehab referral if vertigo remains persistent or worsens   Patient discharged to home in Discharge Condition: stable CODE STATUS: Full Diet recommendation:  Diet Orders (From admission, onward)    Start     Ordered   03/02/20 0000  Diet general        03/02/20 0944          Hospital Course: Ms. Lutes is a 64 yo female with PMH PAF (on Eliquis), chronic left sinus infection, hypertension, depression, arthritis, obesity who presented to the ER after an outpatient MRI revealed a stroke.  She has been having some vertigo since September but only states approximately 2 episodes total since then.  She has also been following outpatient with ENT with plans for surgery on her left sinus which would require holding her Eliquis.  Due to her vertigo, she was referred to neurology outpatient and underwent the MRI which revealed her stroke.  She states she misses approximately 2 or 3 doses of her Eliquis per week due to falling asleep before her evening dose. MRI brain was not repeated in the ER/on admission however care everywhere review of the report noted a 7 x 3 mm infarct involving the right posterior frontal lobe. Some of her vertigo appeared to be positional in nature.  She was evaluated by PT, OT, SLP during hospitalization with no acute needs.  She was referred for further vestibular work-up during hospitalization.  She was found to have no acute inpatient needs for her vertigo.  She was recommended to continue observation of this at discharge and can have further referral for outpatient vestibular rehab if needed. Her stroke was  considered essentially silent in nature possibly due to recent missed doses of Eliquis.  Ongoing compliance was recommended and patient voiced understanding. She was considered stable for discharging home with ongoing routine outpatient follow-up.   * Acute CVA (cerebrovascular accident) [facility]) - she has no deficits aside from vertigo which initially started in Sept so likely unrelated to findings on MRI (small right posterior frontal lobe infarct) -Vertigo has some positional traits according to patient description -Continue Eliquis -Echo negative for any shunting or thrombus  Vertigo -Patient describes vertigo being precipitated by certain positions or changes in positions at times -Follow-up vestibular work-up; no inpatient needs -Supportive care recommended with referral to outpatient vestibular rehab if persists  A-fib (HCC) -Continue Eliquis -Continue Lopressor  OSA (obstructive sleep apnea) - continue qhs CPAP; patient not very compliant at home but understands the recommendation  GAD (generalized anxiety disorder) -Continue Effexor  Benign essential HTN -Permissive hypertension being allowed initially -At home on amlodipine 2.5 mg daily, clonidine patch weekly, and lopressor 100 BID    The patient's chronic medical conditions were treated accordingly per the patient's home medication regimen except as noted.  On day of discharge, patient was felt deemed stable for discharge. Patient/family member advised to call PCP or come back to ER if needed.   Principal Diagnosis: Acute CVA (cerebrovascular accident) [facility])  Discharge Diagnoses: Active Hospital Problems   Diagnosis Date Noted  . Acute CVA (cerebrovascular accident) (HCC) 02/29/2020    Priority: High  . Vertigo 03/01/2020    Priority: Medium  . A-fib (HCC) 02/08/2019  .  OSA (obstructive sleep apnea) 03/07/2016  . Benign essential HTN 11/09/2014  . GAD (generalized anxiety disorder) 11/09/2014    Resolved  Hospital Problems  No resolved problems to display.    Discharge Instructions    Diet general   Complete by: As directed    Increase activity slowly   Complete by: As directed      Allergies as of 03/02/2020      Reactions   Ace Inhibitors Cough   Hctz [hydrochlorothiazide] Rash   face      Medication List    STOP taking these medications   predniSONE 10 MG (48) Tbpk tablet Commonly known as: STERAPRED UNI-PAK 48 TAB     TAKE these medications   acetaminophen 650 MG CR tablet Commonly known as: Tylenol 8 Hour Arthritis Pain Take 1 tablet (650 mg total) by mouth every 8 (eight) hours as needed for pain. What changed: how much to take   amLODipine 2.5 MG tablet Commonly known as: NORVASC TAKE 1 TABLET BY MOUTH EVERY DAY   apixaban 5 MG Tabs tablet Commonly known as: Eliquis Take 1 tablet (5 mg total) by mouth 2 (two) times daily.   azelastine 0.1 % nasal spray Commonly known as: ASTELIN PLACE 2 SPRAYS INTO BOTH NOSTRILS 2 (TWO) TIMES DAILY What changed: See the new instructions.   cloNIDine 0.1 mg/24hr patch Commonly known as: CATAPRES - Dosed in mg/24 hr One weekly   famotidine 20 MG tablet Commonly known as: PEPCID Take 1 tablet (20 mg total) by mouth daily.   fexofenadine 180 MG tablet Commonly known as: ALLEGRA Take 180 mg by mouth daily.   meclizine 12.5 MG tablet Commonly known as: ANTIVERT Take 1 tablet (12.5 mg total) by mouth 3 (three) times daily as needed for dizziness.   metoprolol tartrate 100 MG tablet Commonly known as: LOPRESSOR Take 1 tablet (100 mg total) by mouth 2 (two) times daily.   Ozempic (1 MG/DOSE) 2 MG/1.5ML Sopn Generic drug: Semaglutide (1 MG/DOSE) Inject 1 mg into the skin once a week. What changed: additional instructions   ramelteon 8 MG tablet Commonly known as: Rozerem Take 1 tablet (8 mg total) by mouth at bedtime.   Trelegy Ellipta 100-62.5-25 MCG/INH Aepb Generic drug: Fluticasone-Umeclidin-Vilant Inhale 1  Inhaler into the lungs daily. What changed: how much to take   venlafaxine XR 150 MG 24 hr capsule Commonly known as: EFFEXOR-XR TAKE 1 CAPSULE  DAILY WITH BREAKFAST What changed:   how much to take  how to take this  when to take this   Vitamin D (Ergocalciferol) 1.25 MG (50000 UNIT) Caps capsule Commonly known as: DRISDOL Take 50,000 Units by mouth once a week. Monday   Xiidra 5 % Soln Generic drug: Lifitegrast Place 1 drop into both eyes 2 (two) times daily.       Allergies  Allergen Reactions  . Ace Inhibitors Cough  . Hctz [Hydrochlorothiazide] Rash    face    Consultations: Neurology  Discharge Exam: BP (!) 172/89 (BP Location: Right Arm)   Pulse 99   Temp 97.9 F (36.6 C) (Oral)   Resp 18   Ht 5\' 8"  (1.727 m)   SpO2 92%   BMI 44.05 kg/m  General appearance: alert, cooperative and no distress Head: Normocephalic, without obvious abnormality, atraumatic Eyes: EOMI Lungs: clear to auscultation bilaterally Heart: irregularly irregular rhythm and S1, S2 normal Abdomen: normal findings: bowel sounds normal and soft, non-tender Extremities: No obvious edema Skin: mobility and turgor normal Neurologic: Grossly normal.  No nystagmus  The results of significant diagnostics from this hospitalization (including imaging, microbiology, ancillary and laboratory) are listed below for reference.   Microbiology: Recent Results (from the past 240 hour(s))  Respiratory Panel by RT PCR (Flu A&B, Covid) - Nasopharyngeal Swab     Status: None   Collection Time: 02/29/20  5:25 PM   Specimen: Nasopharyngeal Swab  Result Value Ref Range Status   SARS Coronavirus 2 by RT PCR NEGATIVE NEGATIVE Final    Comment: (NOTE) SARS-CoV-2 target nucleic acids are NOT DETECTED.  The SARS-CoV-2 RNA is generally detectable in upper respiratoy specimens during the acute phase of infection. The lowest concentration of SARS-CoV-2 viral copies this assay can detect is 131 copies/mL. A  negative result does not preclude SARS-Cov-2 infection and should not be used as the sole basis for treatment or other patient management decisions. A negative result may occur with  improper specimen collection/handling, submission of specimen other than nasopharyngeal swab, presence of viral mutation(s) within the areas targeted by this assay, and inadequate number of viral copies (<131 copies/mL). A negative result must be combined with clinical observations, patient history, and epidemiological information. The expected result is Negative.  Fact Sheet for Patients:  https://www.moore.com/  Fact Sheet for Healthcare Providers:  https://www.young.biz/  This test is no t yet approved or cleared by the [place] FDA and  has been authorized for detection and/or diagnosis of SARS-CoV-2 by FDA under an Emergency Use Authorization (EUA). This EUA will remain  in effect (meaning this test can be used) for the duration of the COVID-19 declaration under Section 564(b)(1) of the Act, 21 U.S.C. section 360bbb-3(b)(1), unless the authorization is terminated or revoked sooner.     Influenza A by PCR NEGATIVE NEGATIVE Final   Influenza B by PCR NEGATIVE NEGATIVE Final    Comment: (NOTE) The Xpert Xpress SARS-CoV-2/FLU/RSV assay is intended as an aid in  the diagnosis of influenza from Nasopharyngeal swab specimens and  should not be used as a sole basis for treatment. Nasal washings and  aspirates are unacceptable for Xpert Xpress SARS-CoV-2/FLU/RSV  testing.  Fact Sheet for Patients: https://www.moore.com/  Fact Sheet for Healthcare Providers: https://www.young.biz/  This test is not yet approved or cleared by the [place] FDA and  has been authorized for detection and/or diagnosis of SARS-CoV-2 by  FDA under an Emergency Use Authorization (EUA). This EUA will remain  in effect (meaning this test can  be used) for the duration of the  Covid-19 declaration under Section 564(b)(1) of the Act, 21  U.S.C. section 360bbb-3(b)(1), unless the authorization is  terminated or revoked. Performed at [facility], [address] [place], [place] [address]      Labs: BNP (last 3 results) No results for input(s): BNP in the last 8760 hours. Basic Metabolic Panel: Recent Labs  Lab 02/29/20 1212 03/02/20 0303  NA 139 139  K 3.5 3.2*  CL 105 105  CO2 26 25  GLUCOSE 84 105*  BUN 9 10  CREATININE 0.76 0.77  CALCIUM 9.1 8.9  MG  --  2.2   Liver Function Tests: Recent Labs  Lab 02/29/20 1212  AST 14*  ALT 13  ALKPHOS 79  BILITOT 0.4  PROT 7.0  ALBUMIN 3.4*   No results for input(s): LIPASE, AMYLASE in the last 168 hours. No results for input(s): AMMONIA in the last 168 hours. CBC: Recent Labs  Lab 02/29/20 1212 03/02/20 0303  WBC 8.3 7.9  NEUTROABS 5.6 4.8  HGB 14.1 13.4  HCT 44.4 41.4  MCV 92.5 90.2  PLT 303 279   Cardiac Enzymes: No results for input(s): CKTOTAL, CKMB, CKMBINDEX, TROPONINI in the last 168 hours. BNP: Invalid input(s): POCBNP CBG: No results for input(s): GLUCAP in the last 168 hours. D-Dimer No results for input(s): DDIMER in the last 72 hours. Hgb A1c No results for input(s): HGBA1C in the last 72 hours. Lipid Profile No results for input(s): CHOL, HDL, LDLCALC, TRIG, CHOLHDL, LDLDIRECT in the last 72 hours. Thyroid function studies No results for input(s): TSH, T4TOTAL, T3FREE, THYROIDAB in the last 72 hours.  Invalid input(s): FREET3 Anemia work up No results for input(s): VITAMINB12, FOLATE, FERRITIN, TIBC, IRON, RETICCTPCT in the last 72 hours. Urinalysis    Component Value Date/Time   COLORURINE YELLOW (A) 02/08/2019 1158   APPEARANCEUR CLEAR (A) 02/08/2019 1158   APPEARANCEUR Clear 07/11/2012 0413   LABSPEC 1.009 02/08/2019 1158   LABSPEC 1.027 07/11/2012 0413   PHURINE 6.0 02/08/2019 1158   GLUCOSEU NEGATIVE 02/08/2019  1158   GLUCOSEU Negative 07/11/2012 0413   HGBUR NEGATIVE 02/08/2019 1158   BILIRUBINUR NEGATIVE 02/08/2019 1158   BILIRUBINUR negative 10/23/2016 0835   BILIRUBINUR Negative 07/11/2012 0413   KETONESUR NEGATIVE 02/08/2019 1158   PROTEINUR NEGATIVE 02/08/2019 1158   UROBILINOGEN 1.0 10/23/2016 0835   NITRITE NEGATIVE 02/08/2019 1158   LEUKOCYTESUR NEGATIVE 02/08/2019 1158   LEUKOCYTESUR Negative 07/11/2012 0413   Sepsis Labs Invalid input(s): PROCALCITONIN,  WBC,  LACTICIDVEN Microbiology Recent Results (from the past 240 hour(s))  Respiratory Panel by RT PCR (Flu A&B, Covid) - Nasopharyngeal Swab     Status: None   Collection Time: 02/29/20  5:25 PM   Specimen: Nasopharyngeal Swab  Result Value Ref Range Status   SARS Coronavirus 2 by RT PCR NEGATIVE NEGATIVE Final    Comment: (NOTE) SARS-CoV-2 target nucleic acids are NOT DETECTED.  The SARS-CoV-2 RNA is generally detectable in upper respiratoy specimens during the acute phase of infection. The lowest concentration of SARS-CoV-2 viral copies this assay can detect is 131 copies/mL. A negative result does not preclude SARS-Cov-2 infection and should not be used as the sole basis for treatment or other patient management decisions. A negative result may occur with  improper specimen collection/handling, submission of specimen other than nasopharyngeal swab, presence of viral mutation(s) within the areas targeted by this assay, and inadequate number of viral copies (<131 copies/mL). A negative result must be combined with clinical observations, patient history, and epidemiological information. The expected result is Negative.  Fact Sheet for Patients:  https://www.moore.com/  Fact Sheet for Healthcare Providers:  https://www.young.biz/  This test is no t yet approved or cleared by the [place] FDA and  has been authorized for detection and/or diagnosis of SARS-CoV-2 by FDA under  an Emergency Use Authorization (EUA). This EUA will remain  in effect (meaning this test can be used) for the duration of the COVID-19 declaration under Section 564(b)(1) of the Act, 21 U.S.C. section 360bbb-3(b)(1), unless the authorization is terminated or revoked sooner.     Influenza A by PCR NEGATIVE NEGATIVE Final   Influenza B by PCR NEGATIVE NEGATIVE Final    Comment: (NOTE) The Xpert Xpress SARS-CoV-2/FLU/RSV assay is intended as an aid in  the diagnosis of influenza from Nasopharyngeal swab specimens and  should not be used as a sole basis for treatment. Nasal washings and  aspirates are unacceptable for Xpert Xpress SARS-CoV-2/FLU/RSV  testing.  Fact Sheet for Patients: https://www.moore.com/  Fact Sheet for  Healthcare Providers: https://www.young.biz/  This test is not yet approved or cleared by the [place] and  has been authorized for detection and/or diagnosis of SARS-CoV-2 by  FDA under an Emergency Use Authorization (EUA). This EUA will remain  in effect (meaning this test can be used) for the duration of the  Covid-19 declaration under Section 564(b)(1) of the Act, 21  U.S.C. section 360bbb-3(b)(1), unless the authorization is  terminated or revoked. Performed at [facility], [address], [place], [place] [address]     Procedures/Studies: MR ANGIO HEAD WO CONTRAST  Result Date: 03/01/2020 CLINICAL DATA:  Stroke follow-up EXAM: MRA NECK WITHOUT AND WITH CONTRAST MRA HEAD WITHOUT CONTRAST TECHNIQUE: Multiplanar and multiecho pulse sequences of the neck were obtained without and with intravenous contrast. Angiographic images of the neck were obtained using MRA technique without and with intravenous contrast.; Angiographic images of the Circle of Willis were obtained using MRA technique without intravenous contrast. CONTRAST:  10mL GADAVIST GADOBUTROL 1 MMOL/ML IV SOLN COMPARISON:  None. FINDINGS: MRA NECK  FINDINGS Normal carotid and vertebral artery systems. MRA HEAD FINDINGS POSTERIOR CIRCULATION: --Vertebral arteries: Normal --Inferior cerebellar arteries: Normal. --Basilar artery: Normal. --Superior cerebellar arteries: Normal. --Posterior cerebral arteries: Normal. ANTERIOR CIRCULATION: --Intracranial internal carotid arteries: Normal. --Anterior cerebral arteries (ACA): Normal. --Middle cerebral arteries (MCA): Normal. ANATOMIC VARIANTS: Basilar artery fenestration. IMPRESSION: Normal MRA of the head and neck. Electronically Signed   By: Deatra Robinson M.D.   On: 03/01/2020 02:28   MR ANGIO NECK W WO CONTRAST  Result Date: 03/01/2020 CLINICAL DATA:  Stroke follow-up EXAM: MRA NECK WITHOUT AND WITH CONTRAST MRA HEAD WITHOUT CONTRAST TECHNIQUE: Multiplanar and multiecho pulse sequences of the neck were obtained without and with intravenous contrast. Angiographic images of the neck were obtained using MRA technique without and with intravenous contrast.; Angiographic images of the Circle of Willis were obtained using MRA technique without intravenous contrast. CONTRAST:  10mL GADAVIST GADOBUTROL 1 MMOL/ML IV SOLN COMPARISON:  None. FINDINGS: MRA NECK FINDINGS Normal carotid and vertebral artery systems. MRA HEAD FINDINGS POSTERIOR CIRCULATION: --Vertebral arteries: Normal --Inferior cerebellar arteries: Normal. --Basilar artery: Normal. --Superior cerebellar arteries: Normal. --Posterior cerebral arteries: Normal. ANTERIOR CIRCULATION: --Intracranial internal carotid arteries: Normal. --Anterior cerebral arteries (ACA): Normal. --Middle cerebral arteries (MCA): Normal. ANATOMIC VARIANTS: Basilar artery fenestration. IMPRESSION: Normal MRA of the head and neck. Electronically Signed   By: Deatra Robinson M.D.   On: 03/01/2020 02:28   ECHOCARDIOGRAM COMPLETE  Result Date: 03/01/2020    ECHOCARDIOGRAM REPORT   Patient Name:   MARCELINA MCLAURIN Date of Exam: 03/01/2020 Medical Rec #:  [id number]          Height:        68.0 in Accession #:    [id number]         Weight:       289.7 lb Date of Birth:  1956-01-06           BSA:          2.392 m Patient Age:    64 years           BP:           159/82 mmHg Patient Gender: F                  HR:           82 bpm. Exam Location:  Inpatient Procedure: 2D Echo Indications:    Stroke I163.9  History:  Patient has prior history of Echocardiogram examinations, most                 recent 08/09/2019. Arrythmias:Atrial Fibrillation; Risk                 Factors:Hypertension.  Sonographer:    Thurman Coyer RDCS (AE) Referring Phys: [id number] DANIEL P GOODRICH IMPRESSIONS  1. Left ventricular ejection fraction, by estimation, is 55 to 60%. The left ventricle has normal function. The left ventricle has no regional wall motion abnormalities. There is mild left ventricular hypertrophy. Left ventricular diastolic parameters are consistent with Grade I diastolic dysfunction (impaired relaxation).  2. Right ventricular systolic function is normal. The right ventricular size is normal. There is normal pulmonary artery systolic pressure. The estimated right ventricular systolic pressure is 22.2 mmHg.  3. Left atrial size was mildly dilated.  4. The mitral valve is normal in structure. Trivial mitral valve regurgitation. No evidence of mitral stenosis.  5. The aortic valve is tricuspid. Aortic valve regurgitation is not visualized. No aortic stenosis is present.  6. The inferior vena cava is normal in size with greater than 50% respiratory variability, suggesting right atrial pressure of 3 mmHg. FINDINGS  Left Ventricle: Left ventricular ejection fraction, by estimation, is 55 to 60%. The left ventricle has normal function. The left ventricle has no regional wall motion abnormalities. The left ventricular internal cavity size was normal in size. There is  mild left ventricular hypertrophy. Left ventricular diastolic parameters are consistent with Grade I diastolic dysfunction (impaired  relaxation). Right Ventricle: The right ventricular size is normal. No increase in right ventricular wall thickness. Right ventricular systolic function is normal. There is normal pulmonary artery systolic pressure. The tricuspid regurgitant velocity is 2.19 m/s, and  with an assumed right atrial pressure of 3 mmHg, the estimated right ventricular systolic pressure is 22.2 mmHg. Left Atrium: Left atrial size was mildly dilated. Right Atrium: Right atrial size was normal in size. Pericardium: There is no evidence of pericardial effusion. Mitral Valve: The mitral valve is normal in structure. Trivial mitral valve regurgitation. No evidence of mitral valve stenosis. Tricuspid Valve: The tricuspid valve is normal in structure. Tricuspid valve regurgitation is trivial. Aortic Valve: The aortic valve is tricuspid. Aortic valve regurgitation is not visualized. No aortic stenosis is present. Pulmonic Valve: The pulmonic valve was normal in structure. Pulmonic valve regurgitation is not visualized. Aorta: The aortic root is normal in size and structure. Venous: The inferior vena cava is normal in size with greater than 50% respiratory variability, suggesting right atrial pressure of 3 mmHg. IAS/Shunts: No atrial level shunt detected by color flow Doppler.  LEFT VENTRICLE PLAX 2D LVIDd:         4.29 cm  Diastology LVIDs:         3.16 cm  LV e' medial:    4.56 cm/s LV PW:         1.23 cm  LV E/e' medial:  13.8 LV IVS:        1.24 cm  LV e' lateral:   5.87 cm/s LVOT diam:     2.10 cm  LV E/e' lateral: 10.7 LV SV:         75 LV SV Index:   31 LVOT Area:     3.46 cm  RIGHT VENTRICLE RV S prime:     17.70 cm/s TAPSE (M-mode): 2.0 cm LEFT ATRIUM             Index  RIGHT ATRIUM           Index LA diam:        3.90 cm 1.63 cm/m  RA Area:     19.80 cm LA Vol (A2C):   83.5 ml 34.90 ml/m RA Volume:   46.00 ml  19.23 ml/m LA Vol (A4C):   91.5 ml 38.25 ml/m LA Biplane Vol: 88.9 ml 37.16 ml/m  AORTIC VALVE LVOT Vmax:   95.80  cm/s LVOT Vmean:  60.000 cm/s LVOT VTI:    0.217 m  AORTA Ao Root diam: 3.10 cm MITRAL VALVE                TRICUSPID VALVE MV Area (PHT): 3.65 cm     TR Peak grad:   19.2 mmHg MV Decel Time: 208 msec     TR Vmax:        219.00 cm/s MV E velocity: 62.90 cm/s MV A velocity: 117.00 cm/s  SHUNTS MV E/A ratio:  0.54         Systemic VTI:  0.22 m                             Systemic Diam: 2.10 cm Marca Anconaalton Mclean MD Electronically signed by Marca Anconaalton Mclean MD Signature Date/Time: 03/01/2020/2:36:52 PM    Final      Time coordinating discharge: Over 30 minutes    Lewie Chamberavid Amazing Cowman, MD  [facility] 03/04/2020, 5:36 PM

## 2020-03-05 ENCOUNTER — Encounter: Payer: Self-pay | Admitting: Family Medicine

## 2020-03-05 ENCOUNTER — Other Ambulatory Visit: Payer: Self-pay

## 2020-03-05 ENCOUNTER — Ambulatory Visit (INDEPENDENT_AMBULATORY_CARE_PROVIDER_SITE_OTHER): Payer: BC Managed Care – PPO | Admitting: Family Medicine

## 2020-03-05 VITALS — BP 140/88 | HR 89 | Temp 97.8°F | Resp 16 | Ht 68.0 in | Wt 285.5 lb

## 2020-03-05 DIAGNOSIS — G4733 Obstructive sleep apnea (adult) (pediatric): Secondary | ICD-10-CM

## 2020-03-05 DIAGNOSIS — I4891 Unspecified atrial fibrillation: Secondary | ICD-10-CM

## 2020-03-05 DIAGNOSIS — I10 Essential (primary) hypertension: Secondary | ICD-10-CM

## 2020-03-05 DIAGNOSIS — H8110 Benign paroxysmal vertigo, unspecified ear: Secondary | ICD-10-CM

## 2020-03-05 DIAGNOSIS — Z8673 Personal history of transient ischemic attack (TIA), and cerebral infarction without residual deficits: Secondary | ICD-10-CM

## 2020-03-05 DIAGNOSIS — E8881 Metabolic syndrome: Secondary | ICD-10-CM

## 2020-03-05 DIAGNOSIS — J329 Chronic sinusitis, unspecified: Secondary | ICD-10-CM

## 2020-03-05 DIAGNOSIS — Z23 Encounter for immunization: Secondary | ICD-10-CM | POA: Diagnosis not present

## 2020-03-05 DIAGNOSIS — K219 Gastro-esophageal reflux disease without esophagitis: Secondary | ICD-10-CM

## 2020-03-05 DIAGNOSIS — F411 Generalized anxiety disorder: Secondary | ICD-10-CM

## 2020-03-05 DIAGNOSIS — F325 Major depressive disorder, single episode, in full remission: Secondary | ICD-10-CM

## 2020-03-05 MED ORDER — OZEMPIC (1 MG/DOSE) 2 MG/1.5ML ~~LOC~~ SOPN: 1.0000 mg | PEN_INJECTOR | SUBCUTANEOUS | 1 refills | Status: DC

## 2020-03-05 MED ORDER — AMLODIPINE BESYLATE 2.5 MG PO TABS: 2.5000 mg | ORAL_TABLET | Freq: Every day | ORAL | 0 refills | Status: DC

## 2020-03-05 MED ORDER — CLONIDINE 0.1 MG/24HR TD PTWK: MEDICATED_PATCH | TRANSDERMAL | 1 refills | Status: DC

## 2020-03-07 ENCOUNTER — Ambulatory Visit: Admit: 2020-03-07 | Payer: BC Managed Care – PPO | Admitting: Otolaryngology

## 2020-03-07 ENCOUNTER — Ambulatory Visit: Payer: BC Managed Care – PPO

## 2020-03-07 SURGERY — SINUS SURGERY, WITH IMAGING GUIDANCE
Anesthesia: General | Laterality: Right

## 2020-03-12 ENCOUNTER — Ambulatory Visit
Admission: RE | Admit: 2020-03-12 | Discharge: 2020-03-12 | Disposition: A | Discharge status: Home / Self Care | Payer: BC Managed Care – PPO | Source: Ambulatory Visit | Attending: Family Medicine | Admitting: Family Medicine

## 2020-03-12 ENCOUNTER — Other Ambulatory Visit: Payer: Self-pay

## 2020-03-12 DIAGNOSIS — Z1231 Encounter for screening mammogram for malignant neoplasm of breast: Secondary | ICD-10-CM

## 2020-03-20 ENCOUNTER — Other Ambulatory Visit: Payer: Self-pay | Admitting: Psychiatry

## 2020-03-20 DIAGNOSIS — F5105 Insomnia due to other mental disorder: Secondary | ICD-10-CM

## 2020-03-22 ENCOUNTER — Other Ambulatory Visit: Payer: Self-pay

## 2020-03-22 ENCOUNTER — Telehealth (INDEPENDENT_AMBULATORY_CARE_PROVIDER_SITE_OTHER): Payer: BC Managed Care – PPO | Admitting: Internal Medicine

## 2020-03-22 ENCOUNTER — Encounter: Payer: Self-pay | Admitting: Internal Medicine

## 2020-03-22 DIAGNOSIS — R519 Headache, unspecified: Secondary | ICD-10-CM

## 2020-03-22 DIAGNOSIS — J069 Acute upper respiratory infection, unspecified: Secondary | ICD-10-CM | POA: Diagnosis not present

## 2020-03-22 NOTE — Progress Notes (Signed)
Name: Regina Christensen   MRN: [id number]    DOB: 1955-04-27   Date:03/22/2020       Progress Note  Subjective  Chief Complaint  Chief Complaint  Patient presents with  . Cough    Head hurts bad when she cough  . Ear Pain  . Sore Throat  . Laryngitis    I connected with  [name] on 03/22/20 at  7:40 AM EST by telephone and verified that I am speaking with the correct person using two identifiers.  I discussed the limitations, risks, security and privacy concerns of performing an evaluation and management service by telephone and the availability of in person appointments. The patient expressed understanding and agreed to proceed. Staff also discussed with the patient that there may be a patient responsible charge related to this service. Patient Location: Home Provider Location: [facility] Additional Individuals present: none  HPI  Patient is a 64 year old female patient of [name] Last visit with her was 03/05/2020. Follows up today with a phone visit with URI complaints  Covid vaccination status - had booster Saturday after had shots Had Covid test Tuesday across from the mall and was negative, sounds like had PCR and not rapid test  She notes her symptoms started Sunday, worsened since started + cough, head hurts when coughs, no marked production Headaches mostly arising with a cough, denies any one-sided symptoms of concern, no vision changes with the headaches. No marked SOB No fever,  + sore throat. Losing voice Min mild congestion, no sinus pain ? loss of smell, no loss of taste no N/V No bad muscle aches No marked loose stools/diarrhea No CP, passing out episodes  Taking cough syrup yesterday twice - HC product, tylenol prn Nervous about taking meds with recent "silent stroke" Comorbid conditions reviewed No asthma/COPD hx,  No h/o DM, + prediabetes and on Ozempic + heart disease,  + history of CVA + morbid obesity history, status post gastric  bypass  No tobacco history  Works 4 hours a day from home and still doing so  Patient Active Problem List   Diagnosis Date Noted  . History of CVA (cerebrovascular accident) without residual deficits 03/05/2020  . Vertigo 03/01/2020  . Acute CVA (cerebrovascular accident) (HCC) 02/29/2020  . Headache disorder 12/20/2019  . Leukocytosis 07/19/2019  . History of hysterectomy 07/19/2019  . Drowsy 07/19/2019  . Anxiety 07/19/2019  . MDD (major depressive disorder), recurrent, in full remission (HCC) 07/19/2019  . 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 History of total knee arthroplasty 06/06/2019  . A-fib (HCC) 02/08/2019  . Atrial fibrillation with rapid ventricular response (HCC) 02/07/2019  . Atrial fibrillation, new onset (HCC) 02/03/2019  . Bilateral carpal tunnel syndrome 07/30/2016  . OSA (obstructive sleep apnea) 03/07/2016  . MDD (major depressive disorder), recurrent episode, mild (HCC) 11/30/2015  . Osteoarthritis of both knees 11/30/2015  . BPPV (benign paroxysmal positional vertigo) 08/28/2015  . Benign essential HTN 11/09/2014  . Hyperglycemia 11/09/2014  . GAD (generalized anxiety disorder) 11/09/2014  . Acanthosis nigricans 11/09/2014  . Morbid obesity (HCC) 01/06/2012    Past Surgical History:  Procedure Laterality Date  . ABDOMINAL HYSTERECTOMY    . BREAST EXCISIONAL BIOPSY Left   . ENDOMETRIAL ABLATION     x2  . GASTRIC BYPASS     sleeve  . TOTAL KNEE ARTHROPLASTY Right     Family History  Problem Relation Age of Onset  . Heart disease Father   . Cancer Brother   .  Mental illness Neg Hx     Social History   Tobacco Use  . Smoking status: Never Smoker  . Smokeless tobacco: Never Used  Substance Use Topics  . Alcohol use: No    Alcohol/week: 0.0 standard drinks    Comment: rare     Current Outpatient Medications:  .  acetaminophen (TYLENOL 8 HOUR ARTHRITIS PAIN) 650 MG CR tablet, Take 1 tablet (650 mg total) by mouth every 8  (eight) hours as needed for pain. (Patient taking differently: Take 1,300 mg by mouth every 8 (eight) hours as needed for pain. ), Disp: 90 tablet, Rfl: 0 .  amLODipine (NORVASC) 2.5 MG tablet, Take 1 tablet (2.5 mg total) by mouth daily., Disp: 90 tablet, Rfl: 0 .  apixaban (ELIQUIS) 5 MG TABS tablet, Take 1 tablet (5 mg total) by mouth 2 (two) times daily., Disp: 180 tablet, Rfl: 2 .  azelastine (ASTELIN) 0.1 % nasal spray, PLACE 2 SPRAYS INTO BOTH NOSTRILS 2 (TWO) TIMES DAILY (Patient taking differently: Place 2 sprays into both nostrils 2 (two) times daily. ), Disp: 30 mL, Rfl: 1 .  cloNIDine (CATAPRES - DOSED IN MG/24 HR) 0.1 mg/24hr patch, One weekly, Disp: 12 patch, Rfl: 1 .  famotidine (PEPCID) 20 MG tablet, Take 1 tablet (20 mg total) by mouth daily., Disp: 90 tablet, Rfl: 0 .  fexofenadine (ALLEGRA) 180 MG tablet, Take 180 mg by mouth daily., Disp: , Rfl:  .  meclizine (ANTIVERT) 12.5 MG tablet, Take 1 tablet (12.5 mg total) by mouth 3 (three) times daily as needed for dizziness., Disp: 30 tablet, Rfl: 0 .  metoprolol tartrate (LOPRESSOR) 100 MG tablet, Take 1 tablet (100 mg total) by mouth 2 (two) times daily., Disp: 180 tablet, Rfl: 3 .  ramelteon (ROZEREM) 8 MG tablet, TAKE 1 TABLET BY MOUTH AT BEDTIME., Disp: 90 tablet, Rfl: 1 .  Semaglutide, 1 MG/DOSE, (OZEMPIC, 1 MG/DOSE,) 2 MG/1.5ML SOPN, Inject 1 mg into the skin once a week. Sunday, Disp: 9 mL, Rfl: 1 .  venlafaxine XR (EFFEXOR-XR) 150 MG 24 hr capsule, TAKE 1 CAPSULE  DAILY WITH BREAKFAST (Patient taking differently: Take 150 mg by mouth daily with breakfast. TAKE 1 CAPSULE  DAILY WITH BREAKFAST), Disp: 90 capsule, Rfl: 0 .  Vitamin D, Ergocalciferol, (DRISDOL) 1.25 MG (50000 UNIT) CAPS capsule, Take 50,000 Units by mouth once a week. Monday, Disp: , Rfl:  .  XIIDRA 5 % SOLN, Place 1 drop into both eyes 2 (two) times daily., Disp: , Rfl: 4  Allergies  Allergen Reactions  . Ace Inhibitors Cough  . Hctz [Hydrochlorothiazide] Rash     face    With staff assistance, above reviewed with the patient today.  ROS: As per HPI, otherwise no specific complaints on a limited and focused system review   Objective  Virtual encounter, vitals not obtained.  There is no height or weight on file to calculate BMI.  Physical Exam   Appears in NAD via conversation, no coughing episodes during our entire conversation Breathing: No obvious respiratory distress. Speaking in complete sentences Neurological: Pt is alert, Speech is normal Psychiatric: Patient has a normal mood and affect, Judgment and thought content normal.   No results found for this or any previous visit (from the past 72 hour(s)).  PHQ2/9: Depression screen [facility] 2/9 03/22/2020 03/05/2020 11/29/2019 11/17/2019 11/03/2019  Decreased Interest 0 0 0 0 0  Down, Depressed, Hopeless 0 0 0 0 0  PHQ - 2 Score 0 0 0 0 0  Altered  sleeping 3 - 0 0 0  Tired, decreased energy 1 - 0 0 0  Change in appetite 0 - 0 0 0  Feeling bad or failure about yourself  0 - 0 0 0  Trouble concentrating 1 - 0 0 0  Moving slowly or fidgety/restless 0 - 0 0 0  Suicidal thoughts 0 - 0 0 0  PHQ-9 Score 5 - 0 0 0  Difficult doing work/chores Not difficult at all - - - Not difficult at all  Some recent data might be hidden   PHQ-2/9 Result reviewed  Fall Risk: Fall Risk  03/22/2020 03/05/2020 11/29/2019 11/17/2019 11/03/2019  Falls in the past year? 0 0 0 0 0  Number falls in past yr: 0 0 0 0 0  Injury with Fall? 0 0 0 0 0  Risk for fall due to : - - - - -  Follow up - - - - -     Assessment & Plan 1. Viral upper respiratory tract infection?   sx's post Covid booster? 2. Nonintractable headache, unspecified chronicity pattern, unspecified headache type Had a Covid test Tuesday and was negative Discussed with patient that her symptoms may be related to obtaining the booster, as they started the next day after she had the booster shot.  Reassuring she is having no fevers, no markedly  productive cough, not short of breath.  Her cough is very minimal, just notes when she has the cough she gets a bad headache.  Also noted she could have developed a mild upper respiratory infection, likely viral if that is the case, and the importance of closely monitoring here in the near future.  Also noted that it could still be a Covid infection, although does seem less likely as she had that test on Tuesday which was negative.  Felt best to proceed as follows.  Closely monitor as emphasized, and if does start to develop increasing symptoms as outlined at length today, the importance of following up. Can continue Tylenol products as needed for the headaches, or any low-grade temperature that may arise. Recommended a Mucinex type product like a Mucinex DM to help with the cough during the day, and try to minimize use of that cough syrup she has with hydrocodone.  Would only use that at bedtime as needed, and use it sparingly. Emphasized rest and staying well-hydrated.  Also she works at home, and staying relatively isolated until symptoms significantly improved. Do not feel adding antibiotics was indicated and explained that reasoning with her today. Also noted the limitations of this being a phone visit, and if she is having increasing symptoms, she does need to be seen and evaluated.  If she has higher fevers, more severe headaches, more production to her cough, any shortness of breath or chest pains, more run down with any near passing out feelings, she needs to present to an emergency type setting or an urgent care to be assessed.  She was understanding of that.  I discussed the assessment and treatment plan with the patient. The patient was provided an opportunity to ask questions and all were answered. The patient agreed with the plan and demonstrated an understanding of the instructions.  Red flags and when to present for emergency care or RTC including fevers, chest pain, shortness of breath,  new/worsening/un-resolving symptoms reviewed with patient at time of visit.   The patient was advised to call back or seek an in-person evaluation if the symptoms worsen or if the condition fails to  improve as anticipated.  I provided 15 minutes of non-face-to-face time during this encounter that included discussing at length patient's sx/history, pertinent pmhx, medications, treatment and follow up plan. This time also included the necessary documentation, orders, and chart review.  Towanda Malkin, MD

## 2020-03-27 ENCOUNTER — Telehealth: Payer: BC Managed Care – PPO | Admitting: Psychiatry

## 2020-04-06 ENCOUNTER — Telehealth: Payer: Self-pay | Admitting: Cardiology

## 2020-04-06 DIAGNOSIS — I4891 Unspecified atrial fibrillation: Secondary | ICD-10-CM

## 2020-04-06 NOTE — Telephone Encounter (Signed)
Patient calling in stating that on 04/21/20 her state health plan is going to require a prior auth for eliquis. Patients pharmacy will request medication when it is time

## 2020-04-09 MED ORDER — APIXABAN 5 MG PO TABS: 5.0000 mg | ORAL_TABLET | Freq: Two times a day (BID) | ORAL | 1 refills | Status: DC

## 2020-04-09 NOTE — Telephone Encounter (Signed)
Patient needs refill on Eliquis

## 2020-04-09 NOTE — Telephone Encounter (Signed)
Pt's age 64, wt 129.5 kg, SCr 0.77, CrCl 152.88, last ov w/ BA 08/16/19. Eliquis 5 mg refill sent in as requested.

## 2020-04-12 DIAGNOSIS — E559 Vitamin D deficiency, unspecified: Secondary | ICD-10-CM | POA: Insufficient documentation

## 2020-04-24 ENCOUNTER — Other Ambulatory Visit: Payer: Self-pay

## 2020-04-24 ENCOUNTER — Telehealth (INDEPENDENT_AMBULATORY_CARE_PROVIDER_SITE_OTHER): Payer: BC Managed Care – PPO | Admitting: Psychiatry

## 2020-04-24 ENCOUNTER — Encounter: Payer: Self-pay | Admitting: Psychiatry

## 2020-04-24 DIAGNOSIS — F3342 Major depressive disorder, recurrent, in full remission: Secondary | ICD-10-CM

## 2020-04-24 DIAGNOSIS — F5105 Insomnia due to other mental disorder: Secondary | ICD-10-CM

## 2020-04-24 DIAGNOSIS — F411 Generalized anxiety disorder: Secondary | ICD-10-CM | POA: Diagnosis not present

## 2020-04-24 MED ORDER — VENLAFAXINE HCL ER 150 MG PO CP24: ORAL_CAPSULE | ORAL | 0 refills | Status: DC

## 2020-04-24 MED ORDER — RAMELTEON 8 MG PO TABS: 8.0000 mg | ORAL_TABLET | Freq: Every day | ORAL | 2 refills | Status: DC

## 2020-04-24 NOTE — Progress Notes (Signed)
Virtual Visit via Telephone Note  I connected with Regina Christensen on 04/24/20 at  4:30 PM EST by telephone and verified that I am speaking with the correct person using two identifiers.  Location Provider Location : [facility] Patient Location : Home  Participants: Patient , Provider   I discussed the limitations, risks, security and privacy concerns of performing an evaluation and management service by telephone and the availability of in person appointments. I also discussed with the patient that there may be a patient responsible charge related to this service. The patient expressed understanding and agreed to proceed.   I discussed the assessment and treatment plan with the patient. The patient was provided an opportunity to ask questions and all were answered. The patient agreed with the plan and demonstrated an understanding of the instructions.   The patient was advised to call back or seek an in-person evaluation if the symptoms worsen or if the condition fails to improve as anticipated.   BH MD OP Progress Note  04/24/2020 4:59 PM Regina Christensen  MRN:  [id number]  Chief Complaint:  Chief Complaint    Follow-up     HPI: Regina Christensen is a 65 year old African-American female, retired, lives in [place], has a history of GAD, MDD, hypertension, gastric bypass, hyperlipidemia, OSA, arthritis was evaluated by telemedicine today.  Patient today reports she is currently doing well with regards to her mood.  She is compliant on the venlafaxine.  Denies side effects.  She reports sleep is improved on the Rozerem.  She is able to sleep through the night more than half the time on a weekly basis.  She does not take naps on a regular basis during the day.  She denies side effects to the Rozerem.  Patient reports she never got her knee surgery done since when she had the MRI scan of her brain done for her history of headaches they found a stroke.  She however reports she does  not have any residual weakness.  She continues to follow-up with her providers.  She however reports  her surgery was hence rescheduled.  Patient denies any suicidality, homicidality or perceptual disturbances.  Patient denies any other concerns today.  Visit Diagnosis:    ICD-10-CM   1. GAD (generalized anxiety disorder)  F41.1 venlafaxine XR (EFFEXOR-XR) 150 MG 24 hr capsule  2. MDD (major depressive disorder), recurrent, in full remission (HCC)  F33.42   3. Insomnia due to mental condition  F51.05 ramelteon (ROZEREM) 8 MG tablet    Past Psychiatric History: I have reviewed past psychiatric history from my progress note on 01/27/2019.  Past trials of Prozac, Effexor.  Past Medical History:  Past Medical History:  Diagnosis Date  . Arthritis   . Atrial fibrillation (HCC)   . Depression   . Hypertension   . Morbid obesity with BMI of 45.0-49.9, adult (HCC)   . Obesity   . Sleep apnea     Past Surgical History:  Procedure Laterality Date  . ABDOMINAL HYSTERECTOMY    . BREAST EXCISIONAL BIOPSY Left   . ENDOMETRIAL ABLATION     x2  . GASTRIC BYPASS     sleeve  . TOTAL KNEE ARTHROPLASTY Right     Family Psychiatric History: I have reviewed family psychiatric history from my progress note on 01/27/2019  Family History:  Family History  Problem Relation Age of Onset  . Heart disease Father   . Cancer Brother   . Mental illness Neg Hx  Social History: Reviewed social history from my progress note on 01/27/2019 Social History   Socioeconomic History  . Marital status: Divorced    Spouse name: Not on file  . Number of children: 2  . Years of education: Not on file  . Highest education level: Associate degree: occupational, Hotel manager, or vocational program  Occupational History  . Occupation: patient Camera operator: [employer]  Tobacco Use  . Smoking status: Never Smoker  . Smokeless tobacco: Never Used  Vaping Use  . Vaping Use: Never used  Substance  and Sexual Activity  . Alcohol use: No    Alcohol/week: 0.0 standard drinks    Comment: rare  . Drug use: No  . Sexual activity: Not on file  Other Topics Concern  . 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 Ace Inhibitors Cough  . Hctz [Hydrochlorothiazide] Rash    face    Metabolic Disorder Labs: Lab Results  Component Value Date   HGBA1C 6.2 (H) 03/01/2020   MPG 131.24 03/01/2020   MPG 134 11/29/2019   No results found for: PROLACTIN Lab Results  Component Value Date   CHOL 132 03/01/2020   TRIG 69 03/01/2020   HDL 69 03/01/2020   CHOLHDL 1.9 03/01/2020   VLDL 14 03/01/2020   LDLCALC 49 03/01/2020   LDLCALC 50 11/29/2019   Lab Results  Component Value Date   TSH 1.358 03/01/2020   TSH 1.05 11/29/2019    Therapeutic Level Labs: No results found for: LITHIUM No results found for: VALPROATE No components found for:  CBMZ  Current Medications: Current Outpatient Medications  Medication Sig Dispense Refill  . acetaminophen (TYLENOL 8 HOUR ARTHRITIS PAIN) 650 MG CR tablet Take 1 tablet (650 mg total) by mouth every 8 (eight) hours as needed for pain. (Patient taking differently: Take 1,300 mg by mouth every 8 (eight) hours as needed for pain. ) 90 tablet 0  . amLODipine (NORVASC) 2.5 MG tablet Take 1 tablet (2.5 mg total) by mouth daily. 90 tablet 0  . amoxicillin (AMOXIL) 875 MG tablet Take by mouth.    Marland Kitchen apixaban (ELIQUIS) 5 MG TABS tablet Take 1 tablet (5 mg total) by mouth 2 (two) times daily. 180 tablet 1  . azelastine (ASTELIN) 0.1 % nasal spray  PLACE 2 SPRAYS INTO BOTH NOSTRILS 2 (TWO) TIMES DAILY (Patient taking differently: Place 2 sprays into both nostrils 2 (two) times daily. ) 30 mL 1  . cloNIDine (CATAPRES - DOSED IN MG/24 HR) 0.1 mg/24hr patch One weekly 12 patch 1  . famotidine (PEPCID) 20 MG tablet Take 1 tablet (20 mg total) by mouth daily. 90 tablet 0  . fexofenadine (ALLEGRA) 180 MG tablet Take 180 mg by mouth daily.    . meclizine (ANTIVERT) 12.5 MG tablet Take 1 tablet (12.5 mg total) by mouth 3 (three) times daily as needed for dizziness. 30 tablet 0  . metoprolol tartrate (LOPRESSOR) 100 MG tablet Take 1 tablet (100 mg total) by mouth 2 (  two) times daily. 180 tablet 3  . ramelteon (ROZEREM) 8 MG tablet Take 1 tablet (8 mg total) by mouth at bedtime. 30 tablet 2  . Semaglutide, 1 MG/DOSE, (OZEMPIC, 1 MG/DOSE,) 2 MG/1.5ML SOPN Inject 1 mg into the skin once a week. Sunday 9 mL 1  . venlafaxine XR (EFFEXOR-XR) 150 MG 24 hr capsule TAKE 1 CAPSULE  DAILY WITH BREAKFAST 90 capsule 0  . Vitamin D, Ergocalciferol, (DRISDOL) 1.25 MG (50000 UNIT) CAPS capsule Take 50,000 Units by mouth once a week. Monday    . XIIDRA 5 % SOLN Place 1 drop into both eyes 2 (two) times daily.  4   No current facility-administered medications for this visit.     Musculoskeletal: Strength & Muscle Tone: UTA Gait & Station: UTA Patient leans: N/A  Psychiatric Specialty Exam: Review of Systems  Psychiatric/Behavioral: Negative for agitation, behavioral problems, confusion, decreased concentration, dysphoric mood, hallucinations, self-injury, sleep disturbance and suicidal ideas. The patient is not nervous/anxious and is not hyperactive.   All other systems reviewed and are negative.   There were no vitals taken for this visit.There is no height or weight on file to calculate BMI.  General Appearance: UTA  Eye Contact:  UTA  Speech:  Clear and Coherent  Volume:  Normal  Mood:  Euthymic  Affect:  UTA  Thought Process:  Goal Directed and  Descriptions of Associations: Intact  Orientation:  Full (Time, Place, and Person)  Thought Content: Logical   Suicidal Thoughts:  No  Homicidal Thoughts:  No  Memory:  Immediate;   Fair Recent;   Fair Remote;   Fair  Judgement:  Fair  Insight:  Fair  Psychomotor Activity:  UTA  Concentration:  Concentration: Fair and Attention Span: Fair  Recall:  [employer] of Knowledge: Fair  Language: Fair  Akathisia:  No  Handed:  Right  AIMS (if indicated):UTA  Assets:  Communication Skills Desire for Improvement Housing Social Support  ADL's:  Intact  Cognition: WNL  Sleep:  Fair   Screenings: GAD-7   Garment/textile technologist Visit from 01/27/2019 in [facility] Office Visit from 01/21/2019 in [facility] Office Visit from 06/21/2018 in [facility] Office Visit from 04/26/2018 in [facility] Office Visit from 02/05/2018 in [facility]  Total GAD-7 Score 10 6 0 0 0    PHQ2-9   Flowsheet Row Video Visit from 03/22/2020 in [facility] Office Visit from 03/05/2020 in [facility] Office Visit from 11/29/2019 in [facility] Video Visit from 11/17/2019 in [facility] Office Visit from 11/03/2019 in [facility]  PHQ-2 Total Score 0 0 0 0 0  PHQ-9 Total Score 5 -- 0 0 0       Assessment and Plan: Regina Christensen is a 65 year old African-American female, divorced, retired, lives in [place], has a history of anxiety, depression, hypertension, hyperlipidemia, OSA, arthritis was evaluated by telemedicine today.  Patient is biologically predisposed given her health problems.  Patient with psychosocial stressors of recent diagnosis of stroke.  Patient however is currently coping well and reports sleep as improved.  Plan as noted below.  Plan GAD-stable Venlafaxine extended release 150 mg p.o.  daily  MDD in remission Venlafaxine extended release 150 mg p.o. daily  Insomnia-improving Rozerem 8 mg p.o. nightly Continue sleep hygiene techniques Continue CPAP  Follow-up in clinic in 6 to 8 weeks or sooner if needed.  I have spent atleast 20 minutes non face to face  with patient today. More than 50 % of the time was spent for preparing to see the patient ( e.g., review of test, records ),  ordering medications and test ,psychoeducation and supportive psychotherapy and care coordination,as well as documenting clinical information in electronic health record. This note was generated in part or whole with voice recognition software. Voice recognition is usually quite accurate but there are transcription errors that can and very often do occur. I apologize for any typographical errors that were not detected and corrected.        Jomarie Longs, MD 04/25/2020, 9:43 AM

## 2020-05-06 ENCOUNTER — Other Ambulatory Visit: Payer: Self-pay | Admitting: Gastroenterology

## 2020-05-10 ENCOUNTER — Encounter (INDEPENDENT_AMBULATORY_CARE_PROVIDER_SITE_OTHER): Payer: Self-pay | Admitting: Family Medicine

## 2020-05-10 ENCOUNTER — Ambulatory Visit (INDEPENDENT_AMBULATORY_CARE_PROVIDER_SITE_OTHER): Payer: BC Managed Care – PPO | Admitting: Family Medicine

## 2020-05-10 ENCOUNTER — Other Ambulatory Visit: Payer: Self-pay

## 2020-05-10 VITALS — BP 148/85 | HR 74 | Temp 97.9°F | Ht 66.0 in | Wt 277.0 lb

## 2020-05-10 DIAGNOSIS — E8881 Metabolic syndrome: Secondary | ICD-10-CM

## 2020-05-10 DIAGNOSIS — E66813 Obesity, class 3: Secondary | ICD-10-CM

## 2020-05-10 DIAGNOSIS — Z8673 Personal history of transient ischemic attack (TIA), and cerebral infarction without residual deficits: Secondary | ICD-10-CM

## 2020-05-10 DIAGNOSIS — K588 Other irritable bowel syndrome: Secondary | ICD-10-CM

## 2020-05-10 DIAGNOSIS — Z1331 Encounter for screening for depression: Secondary | ICD-10-CM

## 2020-05-10 DIAGNOSIS — Z9189 Other specified personal risk factors, not elsewhere classified: Secondary | ICD-10-CM | POA: Diagnosis not present

## 2020-05-10 DIAGNOSIS — R0602 Shortness of breath: Secondary | ICD-10-CM | POA: Diagnosis not present

## 2020-05-10 DIAGNOSIS — E559 Vitamin D deficiency, unspecified: Secondary | ICD-10-CM | POA: Diagnosis not present

## 2020-05-10 DIAGNOSIS — G4733 Obstructive sleep apnea (adult) (pediatric): Secondary | ICD-10-CM

## 2020-05-10 DIAGNOSIS — R7303 Prediabetes: Secondary | ICD-10-CM

## 2020-05-10 DIAGNOSIS — R5383 Other fatigue: Secondary | ICD-10-CM | POA: Diagnosis not present

## 2020-05-10 DIAGNOSIS — I10 Essential (primary) hypertension: Secondary | ICD-10-CM

## 2020-05-10 DIAGNOSIS — Z0289 Encounter for other administrative examinations: Secondary | ICD-10-CM

## 2020-05-10 DIAGNOSIS — F3289 Other specified depressive episodes: Secondary | ICD-10-CM

## 2020-05-10 DIAGNOSIS — M1990 Unspecified osteoarthritis, unspecified site: Secondary | ICD-10-CM

## 2020-05-10 DIAGNOSIS — R42 Dizziness and giddiness: Secondary | ICD-10-CM

## 2020-05-10 DIAGNOSIS — Z6841 Body Mass Index (BMI) 40.0 and over, adult: Secondary | ICD-10-CM

## 2020-05-10 DIAGNOSIS — J320 Chronic maxillary sinusitis: Secondary | ICD-10-CM

## 2020-05-14 NOTE — Progress Notes (Signed)
Chief Complaint:   OBESITY Regina Christensen (MR# [id number]) is a 65 y.o. female who presents for evaluation and treatment of obesity and related comorbidities. Current BMI is Body mass index is 44.71 kg/m. Regina Christensen has been struggling with her weight for many years and has been unsuccessful in either losing weight, maintaining weight loss, or reaching her healthy weight goal.  Regina Christensen is currently in the action stage of change and ready to dedicate time achieving and maintaining a healthier weight. Regina Christensen is interested in becoming our patient and working on intensive lifestyle modifications including (but not limited to) diet and exercise for weight loss.  Regina Christensen has history of gastric sleeve 9 years ago.  She loves gummy bears and salt and vinegar chips.  Regina Christensen provided the following food recall today:  Breakfast:  Sometimes skips breakfast.  Coffee with Splenda, 1/2 & 1/2/milk or [place] vanilla creamer. Snacks:  Likes apples, cheese.  Regina Christensen's habits were reviewed today and are as follows: Her family eats meals together, she thinks her family will eat healthier with her, her desired weight loss is 106 pounds, she has been heavy most of her life, she started gaining weight when she got married, her heaviest weight ever was 313 pounds, she craves gummi bears, she snacks frequently in the evenings, she wakes up sometimes in the middle of the night to eat, she skips breakfast or lunch frequently, she is frequently drinking liquids with calories and she struggles with emotional eating.  Depression Screen Regina Christensen's Food and Mood (modified PHQ-9) score was 12.  Depression screen PHQ 2/9 05/10/2020  Decreased Interest 1  Down, Depressed, Hopeless 1  PHQ - 2 Score 2  Altered sleeping 2  Tired, decreased energy 2  Change in appetite 2  Feeling bad or failure about yourself  2  Trouble concentrating 1  Moving slowly or fidgety/restless 1  Suicidal thoughts 0   PHQ-9 Score 12  Difficult doing work/chores Somewhat difficult  Some recent data might be hidden   Assessment/Plan:   1. Other fatigue Regina Christensen admits to daytime somnolence and reports waking up still tired. Patent has a history of symptoms of daytime fatigue, morning fatigue, morning headache and snoring. Regina Christensen generally gets 4-6 hours of sleep per night, and states that she has poor quality sleep. Snoring is present. Apneic episodes are present. Epworth Sleepiness Score is 9.  Regina Christensen does feel that her weight is causing her energy to be lower than it should be. Fatigue may be related to obesity, depression or many other causes. Labs will be ordered, and in the meanwhile, Regina Christensen will focus on self care including making healthy food choices, increasing physical activity and focusing on stress reduction.  - EKG 12-Lead  2. SOB (shortness of breath) on exertion Regina Christensen notes increasing shortness of breath with exercising and seems to be worsening over time with weight gain. She notes getting out of breath sooner with activity than she used to. This has gotten worse recently. Regina Christensen denies shortness of breath at rest or orthopnea.  Regina Christensen does feel that she gets out of breath more easily that she used to when she exercises. Regina Christensen's shortness of breath appears to be obesity related and exercise induced. She has agreed to work on weight loss and gradually increase exercise to treat her exercise induced shortness of breath. Will continue to monitor closely.  3. Vitamin D deficiency Improving. She has history of gastric sleeve.  She is not taking a bariatric multivitamin, but says she will  start.  Optimal goal > 50 ng/dL.   Plan:  [x]   Start Vitamin D @50 ,000 IU every week. []   Continue home supplement daily. [x]   Follow-up for routine testing of Vitamin D at least 2-3 times per year to avoid over-replacement.  4. OSA (obstructive sleep apnea) Epworth score is 9.  She is  not using her CPAP machine consistently.  OSA is a cause of systemic hypertension and is associated with an increased incidence of stroke, heart failure, atrial fibrillation, and coronary heart disease. Severe OSA increases all-cause mortality and  cardiovascular mortality.   Goal: Treatment of OSA via CPAP compliance and weight loss. . Plasma ghrelin levels (appetite or "hunger hormone") are significantly higher in OSA patients than in BMI-matched controls, but decrease to levels similar to those of obese patients without OSA after CPAP treatment.  . Weight loss improves OSA by several mechanisms, including reduction in fatty tissue in the throat (i.e. parapharyngeal fat) and the tongue. Loss of abdominal fat increases mediastinal traction on the upper airway making it less likely to collapse during sleep. . Studies have also shown that compliance with CPAP treatment improves leptin (hunger inhibitory hormone) imbalance.  5. Essential hypertension Blood pressure slightly elevated today.  Medications: amlodipine 2.5 mg daily, metoprolol 100 mg daily, clonidine 0.1 mg patch once weekly.   Plan: Avoid buying foods that are: processed, frozen, or prepackaged to avoid excess salt. We will continue to monitor symptoms as they relate to her weight loss journey.  BP Readings from Last 3 Encounters:  05/10/20 (!) 148/85  03/05/20 140/88  03/02/20 (!) 172/89   Lab Results  Component Value Date   CREATININE 0.77 03/02/2020   6. Metabolic syndrome Starting goal: Lose 7-10% of starting weight. She will continue to focus on protein-rich, low simple carbohydrate foods. We reviewed the importance of hydration, regular exercise for stress reduction, and restorative sleep.  We will continue to check lab work every 3 months, with 10% weight loss, or should any other concerns arise.  She has been taking Ozempic for a few years.  7. Prediabetes Not at goal. Goal is HgbA1c < 5.7.  Medication: Ozempic 1 mg  subcutaneously weekly.  She will continue to focus on protein-rich, low simple carbohydrate foods. We reviewed the importance of hydration, regular exercise for stress reduction, and restorative sleep.   Lab Results  Component Value Date   HGBA1C 6.2 (H) 03/01/2020   8. History of CVA in adulthood, A Fib Dynastee is taking Eliquis 5 mg twice daily.  9. Other irritable bowel syndrome, lactose intolerance She will avoid triggering foods.    10. Other type of osteoarthritis, unspecified site Regina Christensen takes Tylenol Arthritis for OA pain.  11. Chronic right maxillary sinusitis Followed by ENT.  12. Vertigo Controlled with Epley maneuver.   13. Other depression, with emotional eating Regina Christensen eats at night.  She is taking Effexor 150 mg daily.  PHQ-9 is 12.  14. At risk for activity intolerance Regina Christensen was given approximately 9 minutes of exercise intolerance counseling today. She is 65 y.o. female and has risk factors exercise intolerance including obesity. We discussed intensive lifestyle modifications today with an emphasis on specific weight loss instructions and strategies. Regina Christensen will slowly increase activity as tolerated.  15. Class 3 severe obesity with serious comorbidity and body mass index (BMI) of 40.0 to 44.9 in adult, unspecified obesity type (HCC)  Regina Christensen is currently in the action stage of change and her goal is to continue with weight loss efforts.  I recommend Regina Christensen begin the structured treatment plan as follows:  She has agreed to the Category 1 Plan.  Exercise goals: No exercise has been prescribed at this time.   Behavioral modification strategies: increasing lean protein intake, decreasing simple carbohydrates, increasing vegetables, increasing water intake, decreasing liquid calories, decreasing sodium intake and increasing high fiber foods.  She was informed of the importance of frequent follow-up visits to maximize her success with intensive  lifestyle modifications for her multiple health conditions. She was informed we would discuss her lab results at her next visit unless there is a critical issue that needs to be addressed sooner. Regina Christensen agreed to keep her next visit at the agreed upon time to discuss these results.  Objective:   Blood pressure (!) 148/85, pulse 74, temperature 97.9 F (36.6 C), temperature source Oral, height 5\' 6"  (1.676 m), weight 277 lb (125.6 kg), SpO2 96 %. Body mass index is 44.71 kg/m.  EKG: Normal sinus rhythm, rate 71 bpm.  Indirect Calorimeter completed today shows a VO2 of 161 and a REE of 1119.  Her calculated basal metabolic rate is thus her basal metabolic rate is worse than expected.  General: Cooperative, alert, well developed, in no acute distress. HEENT: Conjunctivae and lids unremarkable. Cardiovascular: Regular rhythm.  Lungs: Normal work of breathing. Neurologic: No focal deficits.   Lab Results  Component Value Date   CREATININE 0.77 03/02/2020   BUN 10 03/02/2020   NA 139 03/02/2020   K 3.2 (L) 03/02/2020   CL 105 03/02/2020   CO2 25 03/02/2020   Lab Results  Component Value Date   ALT 13 02/29/2020   AST 14 (L) 02/29/2020   ALKPHOS 79 02/29/2020   BILITOT 0.4 02/29/2020   Lab Results  Component Value Date   HGBA1C 6.2 (H) 03/01/2020   HGBA1C 6.3 (H) 11/29/2019   HGBA1C 6.3 (H) 02/03/2019   HGBA1C 5.8 (H) 02/05/2018   HGBA1C 5.8 (H) 02/24/2017   Lab Results  Component Value Date   TSH 1.358 03/01/2020   Lab Results  Component Value Date   CHOL 132 03/01/2020   HDL 69 03/01/2020   LDLCALC 49 03/01/2020   TRIG 69 03/01/2020   CHOLHDL 1.9 03/01/2020   Lab Results  Component Value Date   WBC 7.9 03/02/2020   HGB 13.4 03/02/2020   HCT 41.4 03/02/2020   MCV 90.2 03/02/2020   PLT 279 03/02/2020   Attestation Statements:   Reviewed by clinician on day of visit: allergies, medications, problem list, medical history, surgical history, family  history, social history, and previous encounter notes.  This is the patient's first visit at [facility]. The patient's NEW PATIENT PACKET was reviewed at length. Included in the packet: current and past health history, medications, allergies, ROS, gynecologic history (women only), surgical history, family history, social history, weight history, weight loss surgery history (for those that have had weight loss surgery), nutritional evaluation, mood and food questionnaire, PHQ9, Epworth questionnaire, sleep habits questionnaire, patient life and health improvement goals questionnaire. These will all be scanned into the patient's chart under media.   During the visit, I independently reviewed the patient's EKG, bioimpedance scale results, and indirect calorimeter results. I used this information to tailor a meal plan for the patient that will help her to lose weight and will improve her obesity-related conditions going forward. I performed a medically necessary appropriate examination and/or evaluation. I discussed the assessment and treatment plan with the patient. The patient was provided an  opportunity to ask questions and all were answered. The patient agreed with the plan and demonstrated an understanding of the instructions. Labs were ordered at this visit and will be reviewed at the next visit unless more critical results need to be addressed immediately. Clinical information was updated and documented in the EMR.   Time spent on visit including pre-visit chart review and post-visit charting and care was 60 minutes.   I, Insurance claims handler, CMA, am acting as transcriptionist for Helane Rima, DO  I have reviewed the above documentation for accuracy and completeness, and I agree with the above. Helane Rima, DO

## 2020-05-15 MED ORDER — VITAMIN D (ERGOCALCIFEROL) 1.25 MG (50000 UNIT) PO CAPS: 50000.0000 [IU] | ORAL_CAPSULE | ORAL | 0 refills | Status: DC

## 2020-05-24 ENCOUNTER — Encounter (INDEPENDENT_AMBULATORY_CARE_PROVIDER_SITE_OTHER): Payer: Self-pay | Admitting: Family Medicine

## 2020-05-24 ENCOUNTER — Ambulatory Visit (INDEPENDENT_AMBULATORY_CARE_PROVIDER_SITE_OTHER): Payer: BC Managed Care – PPO | Admitting: Family Medicine

## 2020-05-24 ENCOUNTER — Other Ambulatory Visit: Payer: Self-pay

## 2020-05-24 VITALS — BP 138/86 | HR 71 | Temp 97.7°F | Ht 66.0 in | Wt 272.0 lb

## 2020-05-24 DIAGNOSIS — E65 Localized adiposity: Secondary | ICD-10-CM

## 2020-05-24 DIAGNOSIS — Z9189 Other specified personal risk factors, not elsewhere classified: Secondary | ICD-10-CM

## 2020-05-24 DIAGNOSIS — R7303 Prediabetes: Secondary | ICD-10-CM | POA: Diagnosis not present

## 2020-05-24 DIAGNOSIS — I10 Essential (primary) hypertension: Secondary | ICD-10-CM | POA: Diagnosis not present

## 2020-05-24 DIAGNOSIS — F3289 Other specified depressive episodes: Secondary | ICD-10-CM

## 2020-05-24 DIAGNOSIS — Z6841 Body Mass Index (BMI) 40.0 and over, adult: Secondary | ICD-10-CM

## 2020-05-24 DIAGNOSIS — G478 Other sleep disorders: Secondary | ICD-10-CM | POA: Diagnosis not present

## 2020-05-29 NOTE — Progress Notes (Signed)
Chief Complaint:   OBESITY Regina Christensen is here to discuss her progress with her obesity treatment plan along with follow-up of her obesity related diagnoses.   Today's visit was #: 2 Starting weight: 277 lbs Starting date: 05/10/2020 Today's weight: 272 lbs Today's date: 05/24/2020 Total lbs lost to date: 5 lbs Body mass index is 43.9 kg/m.  Total weight loss percentage to date: -1.81%  Interim History: [name] says she has been waking up hungry during the night (1-3 am).  She says she needs to eat to get back to sleep (ex. apple).  She eats dinner around 6-7 pm (biggest meal). Nutrition Plan: Category 1 Plan for 98% of the time. Anti-obesity medications: Ozempic 1 mg weekly. Reported side effects: None. Activity: None at this time.  Assessment/Plan:   1. Essential hypertension At goal. Medications: Norvasc 2.5 mg daily, clonidine patch weekly, metoprolol 100 mg twice daily.   Plan: Avoid buying foods that are: processed, frozen, or prepackaged to avoid excess salt. We will continue to monitor closely alongside her PCP and/or Specialist.  Regular follow up with PCP and specialists was also encouraged.   BP Readings from Last 3 Encounters:  05/24/20 138/86  05/10/20 (!) 148/85  03/05/20 140/88   Lab Results  Component Value Date   CREATININE 0.77 03/02/2020   2. Prediabetes Not at goal. Goal is HgbA1c < 5.7.  Medication: Ozempic 1 mg subcutaneously weekly.   Plan:  Continue Ozempic.  She will continue to focus on protein-rich, low simple carbohydrate foods. We reviewed the importance of hydration, regular exercise for stress reduction, and restorative sleep.   Lab Results  Component Value Date   HGBA1C 6.2 (H) 03/01/2020   3. Visceral obesity Current visceral fat rating: 18. Visceral fat rating should be < 13. Visceral adipose tissue is a hormonally active component of total body fat. This body composition phenotype is associated with medical disorders such as  metabolic syndrome, cardiovascular disease and several malignancies including prostate, breast, and colorectal cancers. Starting goal: Lose 7-10% of starting weight.   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  Plan:  Okay for her to have a snack prior to bed.  We discussed sleep hygiene.   Counseling . Night Eating Syndrome is characterized by eating after awakening from sleep or by excessive food consumption (usually > 25% of daily calories) after the evening meal. It is "relapsing and remitting," meaning that it comes and goes. It usually worsens during stressful times in your life. The person is aware and recalls the eating. Night Eating Syndrome usually starts in early adulthood. Men and women are both affected, but women tend to have more severe symptoms.  . Interesting statistics: o The prevalence of NES is 1.5% of the general population, but found in 8.9% of patients that enroll in an obesity medicine program. Approximately 15-20% ALSO have Binge Eating Disorder.  . Treatment for Night Eating Syndrome include: o CBT, SSRIs, Topiramate, Progressive Muscle Relaxation, Phototherapy, and Normalized Eating.  5. Other depression, with emotional eating Improving, but not optimized. Medication: Effexor-XR 150 mg daily.  Behavior modification techniques were discussed today to help deal with emotional/non-hunger eating behaviors.  6. At risk for heart disease Due to Judyann's current state of health and medical condition(s), she is at a higher risk for heart disease.  This puts the patient at much greater risk to subsequently develop cardiopulmonary conditions that  can significantly affect patient's quality of life in a negative manner.    At least 9 minutes were spent on counseling [name] about these concerns today. Counseling:  Intensive lifestyle modifications were  discussed with [name] as the most appropriate first line of treatment.  she will continue to work on diet, exercise, and weight loss efforts.  We will continue to reassess these conditions on a fairly regular basis in an attempt to decrease the patient's overall morbidity and mortality.  Evidence-based interventions for health behavior change were utilized today including the discussion of self monitoring techniques, problem-solving barriers, and SMART goal setting techniques.  Specifically, regarding patient's less desirable eating habits and patterns, we employed the technique of small changes when [name] has not been able to fully commit to her prudent nutritional plan.  7. Class 3 severe obesity with serious comorbidity and body mass index (BMI) of 40.0 to 44.9 in adult, unspecified obesity type (HCC)  Course: [name] is currently in the action stage of change. As such, her goal is to continue with weight loss efforts.   Nutrition goals: She has agreed to the Category 1 Plan.   Exercise goals: No exercise has been prescribed at this time.  Behavioral modification strategies: increasing lean protein intake, decreasing simple carbohydrates, increasing vegetables, increasing water intake and decreasing liquid calories.  [name] has agreed to follow-up with our clinic in 2 weeks. She was informed of the importance of frequent follow-up visits to maximize her success with intensive lifestyle modifications for her multiple health conditions.   Objective:   Blood pressure 138/86, pulse 71, temperature 97.7 F (36.5 C), temperature source Oral, height 5\' 6"  (1.676 m), weight 272 lb (123.4 kg), SpO2 94 %. Body mass index is 43.9 kg/m.  General: Cooperative, alert, well developed, in no acute distress. HEENT: Conjunctivae and lids unremarkable. Cardiovascular: Regular rhythm.  Lungs: Normal work of breathing. Neurologic: No focal deficits.   Lab Results  Component Value Date    CREATININE 0.77 03/02/2020   BUN 10 03/02/2020   NA 139 03/02/2020   K 3.2 (L) 03/02/2020   CL 105 03/02/2020   CO2 25 03/02/2020   Lab Results  Component Value Date   ALT 13 02/29/2020   AST 14 (L) 02/29/2020   ALKPHOS 79 02/29/2020   BILITOT 0.4 02/29/2020   Lab Results  Component Value Date   HGBA1C 6.2 (H) 03/01/2020   HGBA1C 6.3 (H) 11/29/2019   HGBA1C 6.3 (H) 02/03/2019   HGBA1C 5.8 (H) 02/05/2018   HGBA1C 5.8 (H) 02/24/2017   Lab Results  Component Value Date   TSH 1.358 03/01/2020   Lab Results  Component Value Date   CHOL 132 03/01/2020   HDL 69 03/01/2020   LDLCALC 49 03/01/2020   TRIG 69 03/01/2020   CHOLHDL 1.9 03/01/2020   Lab Results  Component Value Date   WBC 7.9 03/02/2020   HGB 13.4 03/02/2020   HCT 41.4 03/02/2020   MCV 90.2 03/02/2020   PLT 279 03/02/2020   Attestation Statements:   Reviewed by clinician on day of visit: allergies, medications, problem list, medical history, surgical history, family history, social history, and previous encounter notes.  I, 13/03/2020, CMA, am acting as transcriptionist for Insurance claims handler, DO  I have reviewed the above documentation for accuracy and completeness, and I agree with the above. Helane Rima, DO

## 2020-06-05 NOTE — Progress Notes (Signed)
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  2. 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  3. 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    During hospital stay at [facility]   MR Angio neck and head with and without contrast was normal   Potassium was a little low and was replaced before discharge , not on supplementation but no cramps  Lipid panel showed LD at [facility] at 49 at goal  A1C was 6.2 % and we will recheck every 6 months   Echo was negative for thrombus, mild diastolic dysfunction   Since Nov she has been compliant with Eliquis daily , no other events since.   Vertigo: she was diagnosed with BPPV previously had Epley maneuver, but symptoms have been more intense and was seen by ENT and neurologist , she had vestibular rehab but still has mild intermittent episodes and would like some meclizine to take it prn   Recurrent sinusitis: she was supposed to have sinus surgery but it was postponed due to stroke, she will call to re-scheduled   Afib: she states since she has been on higher dose of Metoprolol at 100 mg BIDshe states episodes of palpitation  are mild and seldom now, she was forgetting  to take Eliquis but since stroke on 11/09/2021she has been taking it daily, no bleeding or side effects She got refills from cardiologist   MDD/GAD: she is seeing Dr. Elna Breslow and also therapist. She retired from 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 She is compliant with medication and is taking down again to Effexor 150  mg , Rozerem 8 mg . Stable   Insomnia: she states Dr. Elna Breslow,  she was given rx of Rozerem and states it seems to be helping, forgets to take it at times   HTN: she is only on lopressor and clonidine patch,  norvasc 2.5 mg , she states neurologist , Dr. Malvin Johns, advised her to try stopping clonidine patch, we will adjust dose of norvasc and add Benicar, explained bp may spike in the beginning   Morbid obesity: [facility], lost 7 lbs since January 2022, following a diet plan that they gave to her.   OA knees: had surgery right knee, TKR and will go for left knee replacement, but postponed because of recent stroke Doing better with right side, still has some pain going up and down stairs, left knee is weaker and has grinding sensation, she will see Ortho - Dr. Odis Luster .   Trigeminal neuralgia. She used to take tegretol in the past and needs a refill, pain is shooting and had been gone for years but had some pain end of December 2020 , no flares in months. She stopped lyrica on her own, no symptoms at this time . Unchanged   Pre-diabetes: she is on Ozempic, last A1C was 6.2. % during hospital stsay., she denies polyphagia, polyuria or polydipsia . Recheck next visit   Patient Active Problem List   Diagnosis Date Noted  . Vitamin D deficiency 04/12/2020  .  History of CVA (cerebrovascular accident) without residual deficits 03/05/2020  . Vertigo 03/01/2020  . Acute CVA (cerebrovascular accident) (HCC) 02/29/2020  . Headache disorder 12/20/2019  . Leukocytosis 07/19/2019  . History of hysterectomy 07/19/2019  . Drowsy 07/19/2019  . Anxiety 07/19/2019  . MDD (major depressive disorder), recurrent, in full remission (HCC) 07/19/2019  . 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 History of total knee arthroplasty 06/06/2019  . A-fib (HCC) 02/08/2019  . Atrial fibrillation with rapid ventricular response (HCC) 02/07/2019  . Atrial fibrillation, new onset (HCC) 02/03/2019  . Bilateral carpal tunnel syndrome  07/30/2016  . OSA (obstructive sleep apnea) 03/07/2016  . MDD (major depressive disorder), recurrent episode, mild (HCC) 11/30/2015  . Osteoarthritis of both knees 11/30/2015  . BPPV (benign paroxysmal positional vertigo) 08/28/2015  . Benign essential HTN 11/09/2014  . Hyperglycemia 11/09/2014  . GAD (generalized anxiety disorder) 11/09/2014  . Acanthosis nigricans 11/09/2014  . Morbid obesity (HCC) 01/06/2012    Past Surgical History:  Procedure Laterality Date  . ABDOMINAL HYSTERECTOMY    . BREAST EXCISIONAL BIOPSY Left   . ENDOMETRIAL ABLATION     x2  . GASTRIC BYPASS     sleeve  . TOTAL KNEE ARTHROPLASTY Right     Family History  Problem Relation Age of Onset  . Diabetes Mother   . High blood pressure Mother   . Heart disease Father   . Cancer Brother   . Mental illness Neg Hx     Social History   Tobacco Use  . Smoking status: Never Smoker  . Smokeless tobacco: Never Used  Substance Use Topics  . Alcohol use: No    Alcohol/week: 0.0 standard drinks    Comment: rare     Current Outpatient Medications:  .  Acetaminophen (TYLENOL ARTHRITIS PAIN PO), Take by mouth. OTC PRN, Disp: , Rfl:  .  apixaban (ELIQUIS) 5 MG TABS tablet, Take 1 tablet (5 mg total) by mouth 2 (two) times daily., Disp: 180 tablet, Rfl: 1 .  famotidine (PEPCID) 20 MG tablet, Take 1 tablet (20 mg total) by mouth daily., Disp: 90 tablet, Rfl: 0 .  fexofenadine (ALLEGRA) 180 MG tablet, Take 180 mg by mouth daily., Disp: , Rfl:  .  metoprolol tartrate (LOPRESSOR) 100 MG tablet, Take 1 tablet (100 mg total) by mouth 2 (two) times daily., Disp: 180 tablet, Rfl: 3 .  olmesartan (BENICAR) 40 MG tablet, Take 1 tablet (40 mg total) by mouth daily., Disp: 30 tablet, Rfl: 0 .  ramelteon (ROZEREM) 8 MG tablet, Take 1 tablet (8 mg total) by mouth at bedtime., Disp: 30 tablet, Rfl: 2 .  Semaglutide, 1 MG/DOSE, (OZEMPIC, 1 MG/DOSE,) 2 MG/1.5ML SOPN, Inject 1 mg into the skin once a week. Sunday, Disp: 9 mL,  Rfl: 1 .  venlafaxine XR (EFFEXOR-XR) 150 MG 24 hr capsule, TAKE 1 CAPSULE  DAILY WITH BREAKFAST, Disp: 90 capsule, Rfl: 0 .  Vitamin D, Ergocalciferol, (DRISDOL) 1.25 MG (50000 UNIT) CAPS capsule, Take 1 capsule (50,000 Units total) by mouth every 7 (seven) days., Disp: 4 capsule, Rfl: 0 .  XIIDRA 5 % SOLN, Place 1 drop into both eyes 2 (two) times daily., Disp: , Rfl: 4 .  amLODipine (NORVASC) 5 MG tablet, Take 1 tablet (5 mg total) by mouth daily., Disp: 30 tablet, Rfl: 0 .  meclizine (ANTIVERT) 12.5 MG tablet, Take 1 tablet (12.5 mg total) by mouth 3 (three) times daily as needed for dizziness., Disp: 30 tablet, Rfl: 0  Allergies  Allergen Reactions  . Ace Inhibitors Cough  . Hctz [Hydrochlorothiazide] Rash    face    I personally reviewed active problem list, medication list, allergies, family history, social history, health maintenance with the patient/caregiver today.   ROS  Constitutional: Negative for fever, positive for  weight change.  Respiratory: Negative for cough and shortness of breath.   Cardiovascular: Negative for chest pain or palpitations.  Gastrointestinal: Negative for abdominal pain, no bowel changes.  Musculoskeletal: positive  for gait problem but no  joint swelling.  Skin: Negative for rash.  Neurological:positive  for intermittent dizziness but no  headache.  No other specific complaints in a complete review of systems (except as listed in HPI above).  Objective  Vitals:   06/06/20 0750  BP: 130/84  Pulse: 74  Resp: 16  Temp: 98 F (36.7 C)  TempSrc: Oral  SpO2: 99%  Weight: 270 lb (122.5 kg)  Height: 5\' 6"  (1.676 m)    Body mass index is 43.58 kg/m.  Physical Exam  Constitutional: Patient appears well-developed and well-nourished. Obese  No distress.  HEENT: head atraumatic, normocephalic, pupils equal and reactive to light,  neck supple Cardiovascular: Normal rate, regular rhythm and normal heart sounds.  No murmur heard. No BLE  edema. Pulmonary/Chest: Effort normal and breath sounds normal. No respiratory distress. Abdominal: Soft.  There is no tenderness. Muscular skeletal:no  grinding with extension of both knees  Psychiatric: Patient has a normal mood and affect. behavior is normal. Judgment and thought content normal.  PHQ2/9: [facility] 06/06/2020 05/10/2020 03/22/2020 03/05/2020 11/29/2019  Decreased Interest 0 1 0 0 0  Down, Depressed, Hopeless 0 1 0 0 0  PHQ - 2 Score 0 2 0 0 0  Altered sleeping 1 2 3  - 0  Tired, decreased energy 1 2 1  - 0  Change in appetite 0 2 0 - 0  Feeling bad or failure about yourself  0 2 0 - 0  Trouble concentrating 0 1 1 - 0  Moving slowly or fidgety/restless 0 1 0 - 0  Suicidal thoughts 0 0 0 - 0  PHQ-9 Score 2 12 5  - 0  Difficult doing work/chores - Somewhat difficult Not difficult at all - -  Some recent data might be hidden    phq 9 is negative    Fall Risk: Fall Risk  06/06/2020 03/22/2020 03/05/2020 11/29/2019 11/17/2019  Falls in the past year? 0 0 0 0 0  Number falls in past yr: 0 0 0 0 0  Injury with Fall? 0 0 0 0 0  Risk for fall due to : - - - - -  Follow up - - - - -     Functional Status Survey: Is the patient deaf or have difficulty hearing?: No Does the patient have difficulty seeing, even when wearing glasses/contacts?: No Does the patient have difficulty concentrating, remembering, or making decisions?: No Does the patient have difficulty walking or climbing stairs?: Yes Does the patient have difficulty dressing or bathing?: No Does the patient have difficulty doing errands alone such as visiting a doctor's office or shopping?: No    Assessment & Plan  1. Essential hypertension  - olmesartan (BENICAR) 40 MG tablet; Take 1 tablet (40 mg total) by mouth daily.  Dispense: 30 tablet; Refill: 0 - amLODipine (NORVASC) 5 MG tablet; Take 1 tablet (5 mg total) by mouth daily.  Dispense: 30 tablet; Refill: 0  2. Benign paroxysmal positional  vertigo, unspecified laterality  -  meclizine (ANTIVERT) 12.5 MG tablet; Take 1 tablet (12.5 mg total) by mouth 3 (three) times daily as needed for dizziness.  Dispense: 30 tablet; Refill: 0  3. Morbid obesity (HCC)  Discussed with the patient the risk posed by an increased BMI. Discussed importance of portion control, calorie counting and at least 150 minutes of physical activity weekly. Avoid sweet beverages and drink more water. Eat at least 6 servings of fruit and vegetables daily   4. OSA (obstructive sleep apnea)   5. GERD without esophagitis   6. Atrial fibrillation  (HCC)  Rate controlled today   7. Metabolic syndrome   8. GAD (generalized anxiety disorder)   9. Major depression in remission [facility])  Doing well at this time   10. History of CVA (cerebrovascular accident) without residual deficits

## 2020-06-06 ENCOUNTER — Other Ambulatory Visit: Payer: Self-pay

## 2020-06-06 ENCOUNTER — Ambulatory Visit: Payer: BC Managed Care – PPO | Admitting: Family Medicine

## 2020-06-06 ENCOUNTER — Encounter: Payer: Self-pay | Admitting: Family Medicine

## 2020-06-06 DIAGNOSIS — F411 Generalized anxiety disorder: Secondary | ICD-10-CM

## 2020-06-06 DIAGNOSIS — E8881 Metabolic syndrome: Secondary | ICD-10-CM

## 2020-06-06 DIAGNOSIS — H8110 Benign paroxysmal vertigo, unspecified ear: Secondary | ICD-10-CM

## 2020-06-06 DIAGNOSIS — Z8673 Personal history of transient ischemic attack (TIA), and cerebral infarction without residual deficits: Secondary | ICD-10-CM

## 2020-06-06 DIAGNOSIS — I4819 Other persistent atrial fibrillation: Secondary | ICD-10-CM

## 2020-06-06 DIAGNOSIS — I10 Essential (primary) hypertension: Secondary | ICD-10-CM

## 2020-06-06 DIAGNOSIS — F325 Major depressive disorder, single episode, in full remission: Secondary | ICD-10-CM

## 2020-06-06 DIAGNOSIS — G4733 Obstructive sleep apnea (adult) (pediatric): Secondary | ICD-10-CM

## 2020-06-06 DIAGNOSIS — K219 Gastro-esophageal reflux disease without esophagitis: Secondary | ICD-10-CM

## 2020-06-06 MED ORDER — MECLIZINE HCL 12.5 MG PO TABS: 12.5000 mg | ORAL_TABLET | Freq: Three times a day (TID) | ORAL | 0 refills | Status: DC | PRN

## 2020-06-06 MED ORDER — AMLODIPINE BESYLATE 5 MG PO TABS: 5.0000 mg | ORAL_TABLET | Freq: Every day | ORAL | 0 refills | Status: DC

## 2020-06-06 MED ORDER — FAMOTIDINE 20 MG PO TABS: 20.0000 mg | ORAL_TABLET | Freq: Every day | ORAL | 1 refills | Status: DC

## 2020-06-06 MED ORDER — OLMESARTAN MEDOXOMIL 40 MG PO TABS: 40.0000 mg | ORAL_TABLET | Freq: Every day | ORAL | 0 refills | Status: DC

## 2020-06-07 ENCOUNTER — Encounter (INDEPENDENT_AMBULATORY_CARE_PROVIDER_SITE_OTHER): Payer: Self-pay | Admitting: Family Medicine

## 2020-06-07 ENCOUNTER — Ambulatory Visit (INDEPENDENT_AMBULATORY_CARE_PROVIDER_SITE_OTHER): Payer: BC Managed Care – PPO | Admitting: Family Medicine

## 2020-06-07 ENCOUNTER — Other Ambulatory Visit: Payer: Self-pay

## 2020-06-07 VITALS — BP 147/85 | HR 74 | Temp 98.2°F | Ht 66.0 in | Wt 269.0 lb

## 2020-06-07 DIAGNOSIS — E559 Vitamin D deficiency, unspecified: Secondary | ICD-10-CM | POA: Diagnosis not present

## 2020-06-07 DIAGNOSIS — R7303 Prediabetes: Secondary | ICD-10-CM | POA: Diagnosis not present

## 2020-06-07 DIAGNOSIS — Z6841 Body Mass Index (BMI) 40.0 and over, adult: Secondary | ICD-10-CM | POA: Diagnosis not present

## 2020-06-07 DIAGNOSIS — I10 Essential (primary) hypertension: Secondary | ICD-10-CM

## 2020-06-07 MED ORDER — VITAMIN D (ERGOCALCIFEROL) 1.25 MG (50000 UNIT) PO CAPS: 50000.0000 [IU] | ORAL_CAPSULE | ORAL | 0 refills | Status: DC

## 2020-06-11 NOTE — Progress Notes (Signed)
Chief Complaint:   OBESITY Regina Christensen is here to discuss her progress with her obesity treatment plan along with follow-up of her obesity related diagnoses. Regina Christensen is on the Category 1 Plan and states she is following her eating plan approximately 95% of the time. Regina Christensen states she is doing 0 minutes 0 times per week.  Today's visit was #: 3 Starting weight: 277 lbs Starting date: 05/10/2020 Today's weight: 269 lbs Today's date: 06/07/2020 Total lbs lost to date: 8 lbs Total lbs lost since last in-office visit: 3 lbs  Interim History: Regina Christensen feels she should have lost more weight. She lost 3 lbs today. She has a history of sleeve gastrectomy. Nadir 262 lbs and highest weight was 313 lbs. She feels hunger is satisfied mostly. She is taking one multivitamin daily. She skips breakfast occasionally.  Subjective:   1. Vitamin D deficiency Regina Christensen's Vit D is low at 6.1. she is on prescription Vit D 50,000 IU weekly. She denies fatigue.  2. Pre-diabetes Regina Christensen's last A1c was 6.2. she is not on Metformin. She has a strong family history of diabetes. She is on Ozempic 1 mg weekly.   Lab Results  Component Value Date   HGBA1C 6.2 (H) 03/01/2020   No results found for: INSULIN  3. Essential hypertension Regina Christensen's BP is elevated today. It is intermittently above goal. Her PCP adjusted meds yesterday. She checks her BP at home and it runs 140's/80-90's, but she says her cuff is too small.  BP Readings from Last 3 Encounters:  06/07/20 (!) 147/85  06/06/20 130/84  05/24/20 138/86    Assessment/Plan:   1. Vitamin D deficiency . She agrees to continue to take prescription Vitamin D @50 ,000 IU every week and will follow-up for routine testing of Vitamin D, at least 2-3 times per year to avoid over-replacement. - Vitamin D, Ergocalciferol, (DRISDOL) 1.25 MG (50000 UNIT) CAPS capsule; Take 1 capsule (50,000 Units total) by mouth every 7 (seven) days.  Dispense: 4 capsule;  Refill: 0  2. Pre-diabetes Regina Christensen will continue to work on weight loss, exercise, and decreasing simple carbohydrates to help decrease the risk of diabetes. Continue meal plan.  3. Essential hypertension Continue all meds per PCP. Follow up with PCP.  4. Class 3 severe obesity with serious comorbidity and body mass index (BMI) of 40.0 to 44.9 in adult, unspecified obesity type (HCC) Regina Christensen is currently in the action stage of change. As such, her goal is to continue with weight loss efforts. She has agreed to the Category 1 Plan and keeping a food journal and adhering to recommended goals of 300-400 calories and 35 g protein with supper.   Exercise goals: No exercise has been prescribed at this time.  Behavioral modification strategies: increasing lean protein intake and no skipping meals.  Regina Christensen has agreed to follow-up with our clinic in 2 weeks.   Objective:   Blood pressure (!) 147/85, pulse 74, temperature 98.2 F (36.8 C), height 5\' 6"  (1.676 m), SpO2 97 %. Body mass index is 43.58 kg/m.  General: Cooperative, alert, well developed, in no acute distress. HEENT: Conjunctivae and lids unremarkable. Cardiovascular: Regular rhythm.  Lungs: Normal work of breathing. Neurologic: No focal deficits.   Lab Results  Component Value Date   CREATININE 0.77 03/02/2020   BUN 10 03/02/2020   NA 139 03/02/2020   K 3.2 (L) 03/02/2020   CL 105 03/02/2020   CO2 25 03/02/2020   Lab Results  Component Value Date   ALT 13  02/29/2020   AST 14 (L) 02/29/2020   ALKPHOS 79 02/29/2020   BILITOT 0.4 02/29/2020   Lab Results  Component Value Date   HGBA1C 6.2 (H) 03/01/2020   HGBA1C 6.3 (H) 11/29/2019   HGBA1C 6.3 (H) 02/03/2019   HGBA1C 5.8 (H) 02/05/2018   HGBA1C 5.8 (H) 02/24/2017   No results found for: INSULIN Lab Results  Component Value Date   TSH 1.358 03/01/2020   Lab Results  Component Value Date   CHOL 132 03/01/2020   HDL 69 03/01/2020   LDLCALC 49  03/01/2020   TRIG 69 03/01/2020   CHOLHDL 1.9 03/01/2020   Lab Results  Component Value Date   WBC 7.9 03/02/2020   HGB 13.4 03/02/2020   HCT 41.4 03/02/2020   MCV 90.2 03/02/2020   PLT 279 03/02/2020   No results found for: IRON, TIBC, FERRITIN  Obesity Behavioral Intervention:   Approximately 15 minutes were spent on the discussion below.  ASK: We discussed the diagnosis of obesity with Regina Christensen today and Regina Christensen agreed to give [place] permission to discuss obesity behavioral modification therapy today.  ASSESS: Regina Christensen has the diagnosis of obesity and her BMI today is 43.4. Regina Christensen is in the action stage of change.   ADVISE: Regina Christensen was educated on the multiple health risks of obesity as well as the benefit of weight loss to improve her health. She was advised of the need for long term treatment and the importance of lifestyle modifications to improve her current health and to decrease her risk of future health problems.  AGREE: Multiple dietary modification options and treatment options were discussed and Regina Christensen agreed to follow the recommendations documented in the above note.  ARRANGE: Regina Christensen was educated on the importance of frequent visits to treat obesity as outlined per CMS and USPSTF guidelines and agreed to schedule her next follow up appointment today.  Attestation Statements:   Reviewed by clinician on day of visit: allergies, medications, problem list, medical history, surgical history, family history, social history, and previous encounter notes.  Edmund Hilda, am acting as Energy manager for [place], FNP.  I have reviewed the above documentation for accuracy and completeness, and I agree with the above. -  Jesse Sans, FNP

## 2020-06-12 ENCOUNTER — Encounter (INDEPENDENT_AMBULATORY_CARE_PROVIDER_SITE_OTHER): Payer: Self-pay | Admitting: Family Medicine

## 2020-06-12 DIAGNOSIS — R7303 Prediabetes: Secondary | ICD-10-CM | POA: Insufficient documentation

## 2020-06-13 ENCOUNTER — Other Ambulatory Visit: Payer: Self-pay

## 2020-06-13 ENCOUNTER — Ambulatory Visit: Payer: BC Managed Care – PPO

## 2020-06-13 VITALS — BP 142/88

## 2020-06-13 DIAGNOSIS — Z0130 Encounter for examination of blood pressure without abnormal findings: Secondary | ICD-10-CM

## 2020-06-18 ENCOUNTER — Encounter: Payer: Self-pay | Admitting: Psychiatry

## 2020-06-18 ENCOUNTER — Other Ambulatory Visit: Payer: Self-pay

## 2020-06-18 ENCOUNTER — Telehealth (INDEPENDENT_AMBULATORY_CARE_PROVIDER_SITE_OTHER): Payer: BC Managed Care – PPO | Admitting: Psychiatry

## 2020-06-18 DIAGNOSIS — F3342 Major depressive disorder, recurrent, in full remission: Secondary | ICD-10-CM | POA: Diagnosis not present

## 2020-06-18 DIAGNOSIS — F5105 Insomnia due to other mental disorder: Secondary | ICD-10-CM

## 2020-06-18 DIAGNOSIS — F411 Generalized anxiety disorder: Secondary | ICD-10-CM

## 2020-06-18 NOTE — Progress Notes (Signed)
Virtual Visit via Telephone Note  I connected with Regina Christensen on 06/18/20 at  4:40 PM EST by telephone and verified that I am speaking with the correct person using two identifiers.  Location Provider Location : [facility] Patient Location : Home  Participants: Patient , Provider    I discussed the limitations, risks, security and privacy concerns of performing an evaluation and management service by telephone and the availability of in person appointments. I also discussed with the patient that there may be a patient responsible charge related to this service. The patient expressed understanding and agreed to proceed.    I discussed the assessment and treatment plan with the patient. The patient was provided an opportunity to ask questions and all were answered. The patient agreed with the plan and demonstrated an understanding of the instructions.   The patient was advised to call back or seek an in-person evaluation if the symptoms worsen or if the condition fails to improve as anticipated.  [facility] MD OP Progress Note  06/18/2020 8:47 PM Regina Christensen  MRN:  [id number]  Chief Complaint:  Chief Complaint    Follow-up     HPI: Regina Christensen is a 65 year old African-American female, retired, lives in [place], has a history of GAD, MDD, hypertension, gastric bypass, hyperlipidemia, OSA, arthritis was evaluated by telemedicine today.  Patient today reports she is doing well with regards to her mood.  Denies any significant depression, anxiety.  She reports sleep is good on the Rozerem.  She denies side effects to her medications.  Patient reports she had recent changes with her antihypertensive medication.  She was taken off of the clonidine and started on olmesartan.  She reports so far she is tolerating it and her blood pressure is getting better.  Patient denies any suicidality, homicidality or perceptual disturbances.  Patient denies any other concerns  today.  Visit Diagnosis:    ICD-10-CM   1. GAD (generalized anxiety disorder)  F41.1   2. MDD (major depressive disorder), recurrent, in full remission (HCC)  F33.42   3. Insomnia due to mental condition  F51.05     Past Psychiatric History: I have reviewed past psychiatric history from my progress note on 01/27/2019.  Past trials of Prozac, Effexor.  Past Medical History:  Past Medical History:  Diagnosis Date  . Arthritis   . Atrial fibrillation (HCC)   . Back pain   . Depression   . Edema of both lower extremities   . GERD (gastroesophageal reflux disease)   . Hypertension   . IBS (irritable bowel syndrome)   . Lactose intolerance   . Morbid obesity with BMI of 45.0-49.9, adult (HCC)   . Obesity   . Osteoarthritis   . Prediabetes   . Sinus infection   . Sleep apnea   . Stroke (cerebrum) (HCC)   . Vertigo   . Vitamin D deficiency     Past Surgical History:  Procedure Laterality Date  . ABDOMINAL HYSTERECTOMY    . BREAST EXCISIONAL BIOPSY Left   . ENDOMETRIAL ABLATION     x2  . GASTRIC BYPASS     sleeve  . TOTAL KNEE ARTHROPLASTY Right     Family Psychiatric History: I have reviewed family psychiatric history from my progress note on 01/27/2019.  Family History:  Family History  Problem Relation Age of Onset  . Diabetes Mother   . High blood pressure Mother   . Heart disease Father   . Cancer Brother   .  Mental illness Neg Hx     Social History: I have reviewed social history from my progress note on 01/27/2019. Social History   Socioeconomic History  . Marital status: Divorced    Spouse name: Not on file  . Number of children: 2  . Years of education: Not on file  . Highest education level: Associate degree: occupational, Scientist, product/process development, or vocational program  Occupational History  . Occupation: Retired/ work PT    Employer: [employer]  Tobacco Use  . Smoking status: Never Smoker  . Smokeless tobacco: Never Used  Vaping Use  . Vaping Use: Never used   Substance and Sexual Activity  . Alcohol use: No    Alcohol/week: 0.0 standard drinks    Comment: rare  . Drug use: No  . Sexual activity: Not on file  Other Topics Concern  . 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 Ace Inhibitors Cough  . Hctz [Hydrochlorothiazide] Rash    face    Metabolic Disorder Labs: Lab Results  Component Value Date   HGBA1C 6.2 (H) 03/01/2020   MPG 131.24 03/01/2020   MPG 134 11/29/2019   No results found for: PROLACTIN Lab Results  Component Value Date   CHOL 132 03/01/2020   TRIG 69 03/01/2020   HDL 69 03/01/2020   CHOLHDL 1.9 03/01/2020   VLDL 14 03/01/2020   LDLCALC 49 03/01/2020   LDLCALC 50 11/29/2019   Lab Results  Component Value Date   TSH 1.358 03/01/2020   TSH 1.05 11/29/2019    Therapeutic Level Labs: No results found for: LITHIUM No results found for: VALPROATE No components found for:  CBMZ  Current Medications: Current Outpatient Medications  Medication Sig Dispense Refill  . Acetaminophen (TYLENOL ARTHRITIS PAIN PO) Take by mouth. OTC PRN    . amLODipine (NORVASC) 5 MG tablet Take 1 tablet (5 mg total) by mouth daily. 30 tablet 0  . apixaban (ELIQUIS) 5 MG TABS tablet Take 1 tablet (5 mg total) by mouth 2 (two) times daily. 180 tablet 1  . famotidine (PEPCID) 20 MG tablet Take 1 tablet (20 mg total) by mouth daily. 90 tablet 1  . fexofenadine (ALLEGRA) 180 MG tablet Take 180 mg by mouth daily.    . meclizine (ANTIVERT) 12.5 MG tablet Take 1 tablet (12.5 mg total) by mouth 3 (three) times daily as needed  for dizziness. 30 tablet 0  . metoprolol tartrate (LOPRESSOR) 100 MG tablet Take 1 tablet (100 mg total) by mouth 2 (two) times daily. 180 tablet 3  . olmesartan (BENICAR) 40 MG tablet Take 1 tablet (40 mg total) by mouth daily. 30 tablet 0  . ramelteon (ROZEREM) 8 MG tablet Take 1 tablet (8 mg total) by mouth at bedtime. 30 tablet 2  . Semaglutide, 1 MG/DOSE, (OZEMPIC, 1 MG/DOSE,) 2 MG/1.5ML SOPN Inject 1 mg into the skin once a week. Sunday 9 mL 1  . venlafaxine XR (EFFEXOR-XR) 150 MG 24 hr capsule TAKE 1 CAPSULE  DAILY WITH BREAKFAST 90 capsule  0  . Vitamin D, Ergocalciferol, (DRISDOL) 1.25 MG (50000 UNIT) CAPS capsule Take 1 capsule (50,000 Units total) by mouth every 7 (seven) days. 4 capsule 0  . XIIDRA 5 % SOLN Place 1 drop into both eyes 2 (two) times daily.  4   No current facility-administered medications for this visit.     Musculoskeletal: Strength & Muscle Tone: UTA Gait & Station: UTA Patient leans: N/A  Psychiatric Specialty Exam: Review of Systems  Psychiatric/Behavioral: Negative for agitation, behavioral problems, confusion, decreased concentration, dysphoric mood, hallucinations, self-injury, sleep disturbance and suicidal ideas. The patient is not nervous/anxious and is not hyperactive.   All other systems reviewed and are negative.   There were no vitals taken for this visit.There is no height or weight on file to calculate BMI.  General Appearance: UTA  Eye Contact:  UTA  Speech:  Clear and Coherent  Volume:  Normal  Mood:  Euthymic  Affect:  UTA  Thought Process:  Goal Directed and Descriptions of Associations: Intact  Orientation:  Full (Time, Place, and Person)  Thought Content: Logical   Suicidal Thoughts:  No  Homicidal Thoughts:  No  Memory:  Immediate;   Fair Recent;   Fair Remote;   Fair  Judgement:  Fair  Insight:  Fair  Psychomotor Activity:  UTA  Concentration:  Concentration: Fair and Attention Span: Fair  Recall:  [employer] of Knowledge:  Fair  Language: Fair  Akathisia:  No  Handed:  Right  AIMS (if indicated): UTA  Assets:  Communication Skills Desire for Improvement Social Support  ADL's:  Intact  Cognition: WNL  Sleep:  Fair   Screenings: GAD-7   Garment/textile technologist Visit from 01/27/2019 in [facility] Office Visit from 01/21/2019 in [facility] Office Visit from 06/21/2018 in [facility] Office Visit from 04/26/2018 in [facility] Office Visit from 02/05/2018 in [facility]  Total GAD-7 Score 10 6 0 0 0    PHQ2-9   Flowsheet Row Video Visit from 06/18/2020 in [facility] Office Visit from 06[facility]06/2020 in [facility] Office Visit from 05/10/2020 in [facility] Video Visit from 03/22/2020 in [facility] Office Visit from 03/05/2020 in [facility]  PHQ-2 Total Score 0 0 2 0 0  PHQ-9 Total Score -- 2 12 5  --    Flowsheet Row Video Visit from 06/18/2020 in [facility]  C-SSRS RISK CATEGORY No Risk       Assessment and Plan: Regina Christensen is a 65 year old African-American female, divorced, retired, lives in [place], has a history of anxiety, depression, hypertension, hyperlipidemia, OSA, arthritis was evaluated by telemedicine today.  She is biologically predisposed given her health problems.  Patient with psychosocial stressors of  medical problems, however is currently making progress on the current medication regimen.  Plan as noted below.  Plan GAD-stable Venlafaxine extended release 150 mg p.o. daily  MDD in remission Venlafaxine extended release 150 mg p.o. daily  Insomnia-stable Rozerem 8 mg p.o. nightly Continue CPAP Continue sleep hygiene techniques  Discussed with patient that venlafaxine does have adverse side effect of high blood pressure and if she continues to  have problem with uncontrolled blood pressure will consider tapering her off of the venlafaxine.  Follow-up in clinic in person in 2 to 3 months or sooner if needed.  I have spent atleast 18 minutes with patient today. More than  50 % of the time was spent for preparing to see the patient ( e.g., review of test, records ),  ordering medications and test ,psychoeducation and supportive psychotherapy and care coordination,as well as documenting clinical information in electronic health record.  This note was generated in part or whole with voice recognition software. Voice recognition is usually quite accurate but there are transcription errors that can and very often do occur. I apologize for any typographical errors that were not detected and corrected.       Jomarie Longs, MD 06/19/2020, 5:34 PM

## 2020-06-26 ENCOUNTER — Telehealth: Payer: Self-pay | Admitting: Cardiology

## 2020-06-26 NOTE — Telephone Encounter (Signed)
Patient is now scheduled to see Gillian Shields, NP on 07/06/20 at 8:00 AM.

## 2020-06-26 NOTE — Telephone Encounter (Signed)
   [facility] Pre-operative Risk Assessment    HEARTCARE STAFF: - Please ensure there is not already an duplicate clearance open for this procedure. - Under Visit Info/Reason for Call, type in Other and utilize the format Clearance MM/DD/YY or Clearance TBD. Do not use dashes or single digits. - If request is for dental extraction, please clarify the # of teeth to be extracted.  Request for surgical clearance:  1. What type of surgery is being performed? Sinus surgery  2. When is this surgery scheduled? TBD   3. What type of clearance is required (medical clearance vs. Pharmacy clearance to hold med vs. Both)? both  4. Are there any medications that need to be held prior to surgery and how long? Not listed, please advise if needed  5. Practice name and name of physician performing surgery? [facility] - Dr Margaretha Sheffield   6. What is the office phone number? [phone]   7.   What is the office fax number? [phone]  8.   Anesthesia type (None, local, MAC, general) ? Not listed    Regina Christensen 06/26/2020, 3:08 PM  _________________________________________________________________   (provider comments below)

## 2020-06-26 NOTE — Telephone Encounter (Signed)
   Primary Cardiologist: Debbe Odea, MD  Chart reviewed as part of pre-operative protocol coverage. Because of MADALENA KESECKER past medical history and time since last visit, she will require a follow-up visit in order to better assess preoperative cardiovascular risk.  Pre-op covering staff: - Please schedule appointment and call patient to inform them. If patient already had an upcoming appointment within acceptable timeframe, please add "pre-op clearance" to the appointment notes so provider is aware. - Please contact requesting surgeon's office via preferred method (i.e, phone, fax) to inform them of need for appointment prior to surgery.  If applicable, this message will also be routed to pharmacy pool and/or primary cardiologist for input on holding anticoagulant/antiplatelet agent as requested below so that this information is available to the clearing provider at time of patient's appointment.   Marcelino Duster, PA  06/26/2020, 4:57 PM

## 2020-06-28 ENCOUNTER — Ambulatory Visit (INDEPENDENT_AMBULATORY_CARE_PROVIDER_SITE_OTHER): Payer: BC Managed Care – PPO | Admitting: Family Medicine

## 2020-06-28 ENCOUNTER — Other Ambulatory Visit: Payer: Self-pay

## 2020-06-28 ENCOUNTER — Encounter (INDEPENDENT_AMBULATORY_CARE_PROVIDER_SITE_OTHER): Payer: Self-pay | Admitting: Family Medicine

## 2020-06-28 ENCOUNTER — Other Ambulatory Visit: Payer: Self-pay | Admitting: Family Medicine

## 2020-06-28 VITALS — BP 128/81 | HR 67 | Temp 97.5°F | Ht 66.0 in | Wt 261.0 lb

## 2020-06-28 DIAGNOSIS — R7303 Prediabetes: Secondary | ICD-10-CM

## 2020-06-28 DIAGNOSIS — I10 Essential (primary) hypertension: Secondary | ICD-10-CM

## 2020-06-28 DIAGNOSIS — Z903 Acquired absence of stomach [part of]: Secondary | ICD-10-CM | POA: Diagnosis not present

## 2020-06-28 DIAGNOSIS — E559 Vitamin D deficiency, unspecified: Secondary | ICD-10-CM | POA: Diagnosis not present

## 2020-06-28 DIAGNOSIS — Z9189 Other specified personal risk factors, not elsewhere classified: Secondary | ICD-10-CM

## 2020-06-28 DIAGNOSIS — Z6841 Body Mass Index (BMI) 40.0 and over, adult: Secondary | ICD-10-CM

## 2020-07-03 NOTE — Progress Notes (Signed)
Chief Complaint:   OBESITY Regina Christensen is here to discuss her progress with her obesity treatment plan along with follow-up of her obesity related diagnoses.   Today's visit was #: 4 Starting weight: 277 lbs Starting date: 05/10/2020 Today's weight: 261 lbs Today's date: 06/28/2020 Total lbs lost to date: 16 lbs Body mass index is 42.13 kg/m.  Total weight loss percentage to date: -5.78%  Interim History:  Regina Christensen says she is loving Clinical cytogeneticist.  She has fruit, pork rinds when she wants a snack.  She says she is finding that she is eating more snacks and drinking more water. She is status post cortisone injection in her left knee 2 weeks ago.  She says it is feeling better.    Current Meal Plan: the Category 1 Plan for 50% of the time.  Current Exercise Plan: None. Current Anti-Obesity Medications: Ozempic 1 mg subcutaneously weekly. Side effects: None.  Assessment/Plan:   1. Prediabetes Not at goal. Goal is HgbA1c < 5.7.  Medication: Ozempic 1 mg subcutaneously weekly.    Plan:  She will continue to focus on protein-rich, low simple carbohydrate foods. We reviewed the importance of hydration, regular exercise for stress reduction, and restorative sleep.   Lab Results  Component Value Date   HGBA1C 6.2 (H) 03/01/2020   2. Vitamin D deficiency Not at goal. Current vitamin D is 38.6, tested on 04/09/2020 (Care Everywhere). Optimal goal > 50 ng/dL.  She is taking vitamin D 50,000 IU weekly.  Plan: Continue to take prescription Vitamin D @50 ,000 IU every week as prescribed.  Follow-up for routine testing of Vitamin D, at least 2-3 times per year to avoid over-replacement.  3. Essential hypertension At goal. Medications: Norvasc 5 mg daily, metoprolol 100 mg daily, Benicar 40 mg daily.   Plan: Avoid buying foods that are: processed, frozen, or prepackaged to avoid excess salt. We will continue to monitor closely alongside her PCP and/or Specialist.  Regular  follow up with PCP and specialists was also encouraged.   BP Readings from Last 3 Encounters:  06/28/20 128/81  06/13/20 (!) 142/88  06/07/20 (!) 147/85   Lab Results  Component Value Date   CREATININE 0.77 03/02/2020   4. History of sleeve gastrectomy Regina Christensen is at risk for malnutrition due to her previous bariatric surgery.   Counseling  You may need to eat 3 meals and 2 snacks, or 5 small meals each day in order to reach your protein and calorie goals.   Allow at least 15 minutes for each meal so that you can eat mindfully. Listen to your body so that you do not overeat. For most people, your sleeve or pouch will comfortably hold 4-6 ounces.  Eat foods from all food groups. This includes fruits and vegetables, grains, dairy, and meat and other proteins.  Include a protein-rich food at every meal and snack, and eat the protein food first.   You should be taking a Bariatric Multivitamin as well as calcium.   5. At risk for heart disease Due to Regina Christensen's current state of health and medical condition(s), she is at a higher risk for heart disease.  This puts the patient at much greater risk to subsequently develop cardiopulmonary conditions that can significantly affect patient's quality of life in a negative manner.    At least 8 minutes were spent on counseling Cleola about these concerns today, and I stressed the importance of reversing risks factors of obesity, especially truncal and visceral fat, hypertension,  hyperlipidemia, and pre-diabetes.  The initial goal is to lose at least 5-10% of starting weight to help reduce these risk factors.  Counseling:  Intensive lifestyle modifications were discussed with Regina Christensen as the most appropriate first line of treatment.  She will continue to work on diet, exercise, and weight loss efforts.  We will continue to reassess these conditions on a fairly regular basis in an attempt to decrease the patient's overall morbidity and  mortality.  Evidence-based interventions for health behavior change were utilized today including the discussion of self monitoring techniques, problem-solving barriers, and SMART goal setting techniques.  Specifically, regarding patient's less desirable eating habits and patterns, we employed the technique of small changes when Regina Christensen has not been able to fully commit to her prudent nutritional plan.  6. Class 3 severe obesity with serious comorbidity and body mass index (BMI) of 40.0 to 44.9 in adult, unspecified obesity type (HCC)  Course: Jaziya is currently in the action stage of change. As such, her goal is to continue with weight loss efforts.   Nutrition goals: She has agreed to keeping a food journal and adhering to recommended goals of 1000-1100 calories and 65 grams of protein.   Exercise goals: Chair exercises.  Behavioral modification strategies: increasing water intake.  Regina Christensen has agreed to follow-up with our clinic in 2 weeks. She was informed of the importance of frequent follow-up visits to maximize her success with intensive lifestyle modifications for her multiple health conditions.   Objective:   Blood pressure 128/81, pulse 67, temperature (!) 97.5 F (36.4 C), temperature source Oral, height 5\' 6"  (1.676 m), weight 261 lb (118.4 kg), SpO2 95 %. Body mass index is 42.13 kg/m.  General: Cooperative, alert, well developed, in no acute distress. HEENT: Conjunctivae and lids unremarkable. Cardiovascular: Regular rhythm.  Lungs: Normal work of breathing. Neurologic: No focal deficits.   Lab Results  Component Value Date   CREATININE 0.77 03/02/2020   BUN 10 03/02/2020   NA 139 03/02/2020   K 3.2 (L) 03/02/2020   CL 105 03/02/2020   CO2 25 03/02/2020   Lab Results  Component Value Date   ALT 13 02/29/2020   AST 14 (L) 02/29/2020   ALKPHOS 79 02/29/2020   BILITOT 0.4 02/29/2020   Lab Results  Component Value Date   HGBA1C 6.2 (H) 03/01/2020    HGBA1C 6.3 (H) 11/29/2019   HGBA1C 6.3 (H) 02/03/2019   HGBA1C 5.8 (H) 02/05/2018   HGBA1C 5.8 (H) 02/24/2017   Lab Results  Component Value Date   TSH 1.358 03/01/2020   Lab Results  Component Value Date   CHOL 132 03/01/2020   HDL 69 03/01/2020   LDLCALC 49 03/01/2020   TRIG 69 03/01/2020   CHOLHDL 1.9 03/01/2020   Lab Results  Component Value Date   WBC 7.9 03/02/2020   HGB 13.4 03/02/2020   HCT 41.4 03/02/2020   MCV 90.2 03/02/2020   PLT 279 03/02/2020   Attestation Statements:   Reviewed by clinician on day of visit: allergies, medications, problem list, medical history, surgical history, family history, social history, and previous encounter notes.  I, 13/03/2020, CMA, am acting as transcriptionist for Insurance claims handler, DO  I have reviewed the above documentation for accuracy and completeness, and I agree with the above. Helane Rima, DO

## 2020-07-05 ENCOUNTER — Ambulatory Visit (INDEPENDENT_AMBULATORY_CARE_PROVIDER_SITE_OTHER): Payer: BC Managed Care – PPO | Admitting: Family Medicine

## 2020-07-05 NOTE — Progress Notes (Addendum)
Cardiology Office Note:    Date:  07/06/2020   ID:  Regina Christensen, DOB [birth year], MRN [id number]  PCP:  [name], MD  [facility] Cardiologist:  Debbe Odea, MD  [facility] Electrophysiologist:  None   Referring MD: [name], MD   Chief Complaint: pre-op evaluation  History of Present Illness:    Regina Christensen is a 65 y.o. female with a hx of HTN, Vitamin D deficeincy, pre-diabetes, h/o of sleeve gastrectomy, PAF on Eliquis, obesity, sleep apnea, and stroke 02/2020 in the setting of Eliquis noncompliance who presents fo pre-op evaluation for sinus surgery.  Patient was last seen 08/16/19 and was in SR. CHADSVASC 2 on Eliquis. BP was well controlled.   She had a stress test 2017 showing EF 42%, no significant ischemia, no concerning EKG changes, low risk scan.   Last echo 02/2020 showed LVEF 55-60%, no WMA, mild LVH, G1DD, mildly dilated LA  Today, the patient presents for cardiac evaluation prior to sinus surgery. They are asking about holding Eliquis, patient also hasn't been seen in a year. Message sent to pharmacy. The patient says that she needs left knee replacement, however she has had right sided sinus infection for about 4 months. They have tired multiple antibiotics with no improvement. Now they need to go in and clear out the infection. It will be under general anesthesia.   From a cardiac perspective the patient has been doing well. She had a TIA in November 2021. She was having persistent headaches and underwent MRI. MRI showed small stroke. At that time she had missed some doses of Eliquis, missed about 2 days. CHADSVASC now at least 5 (TIA x2, female, HTN, CHF). Denies chest pain, shortness of breath, lower leg swelling, orthopnea. She has sleep apnea and does not use CPAP every night, maybe three days a week. She has rare palpitations. Able to walk up the stairs, 1-2 blocks, and do housework.Has some lower leg edema, but she says this has  been stable for many year. She was previously on a diuretic. It has been about 5 years since she has been on it and PCP took her off. Apparently did not need it. Does not wear compression stockings, rarely elevates feet. Eats low salt diet. Patient is eating healthier and is 16lbs down over the last few months.   Past Medical History:  Diagnosis Date  . Arthritis   . Atrial fibrillation (HCC)   . Back pain   . Depression   . Edema of both lower extremities   . GERD (gastroesophageal reflux disease)   . Hypertension   . IBS (irritable bowel syndrome)   . Lactose intolerance   . Morbid obesity with BMI of 45.0-49.9, adult (HCC)   . Obesity   . Osteoarthritis   . Prediabetes   . Sinus infection   . Sleep apnea   . Stroke (cerebrum) (HCC)   . Vertigo   . Vitamin D deficiency     Past Surgical History:  Procedure Laterality Date  . ABDOMINAL HYSTERECTOMY    . BREAST EXCISIONAL BIOPSY Left   . ENDOMETRIAL ABLATION     x2  . GASTRIC BYPASS     sleeve  . TOTAL KNEE ARTHROPLASTY Right     Current Medications: Current Meds  Medication Sig  . Acetaminophen (TYLENOL ARTHRITIS PAIN PO) Take by mouth. OTC PRN  . amLODipine (NORVASC) 5 MG tablet Take 1 tablet (5 mg total) by mouth daily.  Marland Kitchen apixaban (ELIQUIS) 5 MG TABS  tablet Take 1 tablet (5 mg total) by mouth 2 (two) times daily.  . famotidine (PEPCID) 20 MG tablet Take 1 tablet (20 mg total) by mouth daily.  . fexofenadine (ALLEGRA) 180 MG tablet Take 180 mg by mouth daily.  . meclizine (ANTIVERT) 12.5 MG tablet Take 1 tablet (12.5 mg total) by mouth 3 (three) times daily as needed for dizziness.  . metoprolol tartrate (LOPRESSOR) 100 MG tablet Take 1 tablet (100 mg total) by mouth 2 (two) times daily.  Marland Kitchen. olmesartan (BENICAR) 40 MG tablet TAKE 1 TABLET BY MOUTH EVERY DAY  . ramelteon (ROZEREM) 8 MG tablet Take 1 tablet (8 mg total) by mouth at bedtime.  . Semaglutide, 1 MG/DOSE, (OZEMPIC, 1 MG/DOSE,) 2 MG/1.5ML SOPN Inject 1 mg  into the skin once a week. Sunday  . venlafaxine XR (EFFEXOR-XR) 150 MG 24 hr capsule TAKE 1 CAPSULE  DAILY WITH BREAKFAST  . Vitamin D, Ergocalciferol, (DRISDOL) 1.25 MG (50000 UNIT) CAPS capsule Take 1 capsule (50,000 Units total) by mouth every 7 (seven) days.  Marland Kitchen. XIIDRA 5 % SOLN Place 1 drop into both eyes 2 (two) times daily.     Allergies:   Ace inhibitors and Hctz [hydrochlorothiazide]   Social History   Socioeconomic History  . Marital status: Divorced    Spouse name: Not on file  . Number of children: 2  . Years of education: Not on file  . Highest education level: Associate degree: occupational, Scientist, product/process developmenttechnical, or vocational program  Occupational History  . Occupation: Retired/ work PT    Employer: [employer]  Tobacco Use  . Smoking status: Never Smoker  . Smokeless tobacco: Never Used  Vaping Use  . Vaping Use: Never used  Substance and Sexual Activity  . Alcohol use: No    Alcohol/week: 0.0 standard drinks    Comment: rare  . Drug use: No  . Sexual activity: Not on file  Other Topics Concern  . 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 There is no history of Mental illness.  ROS:   Please see the history of present illness.     All other systems reviewed and are negative.  EKGs/Labs/Other Studies Reviewed:    The following studies were reviewed today:  Echo 02/2020 1. Left ventricular ejection fraction, by estimation, is 55 to 60%. The   left ventricle has normal function. The left ventricle has no regional  wall motion abnormalities. There is mild left ventricular hypertrophy.  Left ventricular diastolic parameters  are consistent with Grade I diastolic dysfunction (impaired relaxation).  2. Right ventricular systolic function is normal. The right ventricular  size is normal. There is normal pulmonary artery systolic pressure. The  estimated right ventricular systolic pressure is 22.2 mmHg.  3. Left atrial size was mildly dilated.  4. The mitral valve is normal in structure. Trivial mitral valve  regurgitation. No evidence of mitral stenosis.  5. The aortic valve is tricuspid. Aortic valve regurgitation is not  visualized. No aortic stenosis is present.  6. The inferior vena cava is normal in size with greater than 50%  respiratory variability, suggesting right atrial pressure of 3 mmHg.   Myoview lexiscan 2017 Narrative & Impression  Pharmacological myocardial perfusion imaging study with no significant  ischemia Normal wall motion, EF estimated at 42% (likely depressed secondary to GI uptake artifact) No EKG changes concerning for ischemia at peak stress or in recovery. Low risk scan   Signed, Dossie Arbour, MD, Ph.D [facility]        EKG:  EKG is ordered today.  The ekg ordered today demonstrates NSR, 70bpm, possible LVH, nonspecific ST/t wave changes  Recent Labs: 02/29/2020: ALT 13 03/01/2020: TSH 1.358 03/02/2020: BUN 10; Creatinine, Ser 0.77; Hemoglobin 13.4; Magnesium 2.2; Platelets 279; Potassium 3.2; Sodium 139  Recent Lipid Panel    Component Value Date/Time   CHOL 132 03/01/2020 0306   CHOL 151 12/05/2014 1117   TRIG 69 03/01/2020 0306   HDL 69 03/01/2020 0306   HDL 74 12/05/2014 1117   CHOLHDL 1.9 03/01/2020 0306   VLDL 14 03/01/2020 0306   LDLCALC 49 03/01/2020 0306   LDLCALC 50 11/29/2019 1512     Risk Assessment/Calculations:    CHA2DS2-VASc Score = 4  This  indicates a 4.8% annual risk of stroke. The patient's score is based upon: CHF History: No HTN History: Yes Diabetes History: No Stroke History: Yes Vascular Disease History: No Age Score: 0 Gender Score: 1      Physical Exam:    VS:  BP 128/78   Pulse 70   Ht 5\' 6"  (1.676 m)   Wt 266 lb (120.7 kg)   BMI 42.93 kg/m     Wt Readings from Last 3 Encounters:  07/06/20 266 lb (120.7 kg)  06/28/20 261 lb (118.4 kg)  06/07/20 269 lb (122 kg)     GEN:  Well nourished, well developed in no acute distress HEENT: Normal NECK: No JVD; No carotid bruits LYMPHATICS: No lymphadenopathy CARDIAC: RRR, no murmurs, rubs, gallops RESPIRATORY:  Clear to auscultation without rales, wheezing or rhonchi  ABDOMEN: Soft, non-tender, non-distended MUSCULOSKELETAL:  Trace lower leg edema; No deformity  SKIN: Warm and dry NEUROLOGIC:  Alert and oriented x 3 PSYCHIATRIC:  Normal affect   ASSESSMENT:    1. Pre-operative cardiovascular examination   2. Atrial fibrillation, unspecified type (HCC)   3. Essential hypertension   4. Morbid obesity (HCC)   5. Chronic diastolic heart failure (HCC)    PLAN:    In order of problems listed above:  Paroxysmal Afib Patient had a stroke in November after missing about 2 days of Eliquis. CHADSVASC now at least 5.  EKGs from that admission show she was in SR. Today she is in SR. She has rare palpitations. Will likely need lovenox bridge prior to surgery, will consult with pharmacy and DOD. Continue BB for rate control. Spoke with Dr. December, DOD and he recommended lovenox bridging. Will send to pharmacy for this.   HFpEF  Trace lower leg edema on exam, however this is unchanged for many eyars. She has tried diuretics in the past but PCP discontinued this. She follows low salt diet. Recommended compression stockings and leg elevation. She has an echo in November 2021 showing normal LV function and G1DD. No need to repeat at this time.   HTN BP well  controlled. Continue current medications  Obesity  History of  sleeve gastrectomy many years ago. She has lost about 16lbs in the last few months. Lifestyle changes encouraged.   OSA Occasional compliance with CPAP, encouraged full compliance  Pre-op cardiac evaluation Patient has no significant cardiac symptoms. No chest pain or SOB. EKG shows SR with no new ischemic changes. She has some trace edema on lower legs, but this is stable and unchanged for a couple years. Echo in 02/2020 showed normal EF with G1DD. Given stroke off Eliquis will need lovenox bridge as above. Spoke with DOD, Dr. Azucena Cecil, who agreed with Lovenox bridging. She was educated on Lovenox injections in the office today. She will call with a surgery date and she will be contacted by our pharmacy team regarding dosing. METS>4. According to Revised cardiac risk index patient is class 3 risk, 10.1% 30 day risk death, MI, or caridac arrest. Overall patient is at an acceptable risk for planned procedure without additional cardiovascular testing.    Disposition: Follow up 1 year with MD.APP     Signed, Sharlyn Odonnel David Stall, PA-C  07/06/2020 9:05 AM    [facility]

## 2020-07-06 ENCOUNTER — Encounter: Payer: Self-pay | Admitting: Medical

## 2020-07-06 ENCOUNTER — Other Ambulatory Visit: Payer: Self-pay

## 2020-07-06 ENCOUNTER — Ambulatory Visit (INDEPENDENT_AMBULATORY_CARE_PROVIDER_SITE_OTHER): Payer: BC Managed Care – PPO | Admitting: Medical

## 2020-07-06 VITALS — BP 128/78 | HR 70 | Ht 66.0 in | Wt 266.0 lb

## 2020-07-06 DIAGNOSIS — I4891 Unspecified atrial fibrillation: Secondary | ICD-10-CM

## 2020-07-06 DIAGNOSIS — Z01810 Encounter for preprocedural cardiovascular examination: Secondary | ICD-10-CM | POA: Diagnosis not present

## 2020-07-06 DIAGNOSIS — I5032 Chronic diastolic (congestive) heart failure: Secondary | ICD-10-CM

## 2020-07-06 DIAGNOSIS — I10 Essential (primary) hypertension: Secondary | ICD-10-CM

## 2020-07-06 NOTE — Patient Instructions (Addendum)
Medication Instructions:  Continue your current medications.   Dr. Azucena Cecil would like to use a short-acting anticoagulant (blood thinner) called Lovenox prior to your sinus surgery to prevent another stroke. Please call our office when the date of surgery is set and one of our pharmacists will contact you regarding getting this set up.  *If you need a refill on your cardiac medications before your next appointment, please call your pharmacy*  Lab Work: None ordered today.   Testing/Procedures: None ordered today.   Follow-Up: At [facility], you and your health needs are our priority.  As part of our continuing mission to provide you with exceptional heart care, we have created designated Provider Care Teams.  These Care Teams include your primary Cardiologist (physician) and Advanced Practice Providers (APPs -  Physician Assistants and Nurse Practitioners) who all work together to provide you with the care you need, when you need it.  We recommend signing up for the patient portal called "MyChart".  Sign up information is provided on this After Visit Summary.  MyChart is used to connect with patients for Virtual Visits (Telemedicine).  Patients are able to view lab/test results, encounter notes, upcoming appointments, etc.  Non-urgent messages can be sent to your provider as well.   To learn more about what you can do with MyChart, go to [email or web address].    Your next appointment:   1 year(s)  The format for your next appointment:   In Person  Provider:   You may see Debbe Odea, MD or one of the following Advanced Practice Providers on your designated Care Team:    Nicolasa Ducking, NP  Eula Listen, PA-C  Marisue Ivan, PA-C  Cadence Fransico Michael, [place]  Gillian Shields, NP  Other Instructions  Heart Healthy Diet Recommendations: A low-salt diet is recommended. Meats should be grilled, baked, or boiled. Avoid fried foods. Focus on lean protein sources like fish  or chicken with vegetables and fruits. The American Heart Association is a Chief Technology Officer!  American Heart Association Diet and Lifeystyle Recommendations   Exercise recommendations: The American Heart Association recommends 150 minutes of moderate intensity exercise weekly. Try 30 minutes of moderate intensity exercise 4-5 times per week. This could include walking, jogging, or swimming.

## 2020-07-06 NOTE — Telephone Encounter (Signed)
Patient with diagnosis of Afib on Eliquis for anticoagulation.    Procedure: sinus surgery Date of procedure: TBD  CHA2DS2-VASc Score = 4  This indicates a 4.8% annual risk of stroke. The patient's score is based upon: CHF History: No HTN History: Yes Diabetes History: No Stroke History: Yes Vascular Disease History: No Age Score: 0 Gender Score: 1      CrCl 97 ml/min Platelet count 297  Per Dr. Azucena Cecil, patient will need lovenox bridge while off of Eliquis due to recent stroke.  Injection technique teaching today at the [place] office. Once date is set for surgery, we will call pt and go over detailed instructions. She may hold Eliquis 2 days prior to surgery with lovenox bridge. Resume as soon as safely possible after surgery. Ideally 24-36 hr post op.

## 2020-07-12 ENCOUNTER — Other Ambulatory Visit: Payer: Self-pay | Admitting: Family Medicine

## 2020-07-12 DIAGNOSIS — I10 Essential (primary) hypertension: Secondary | ICD-10-CM

## 2020-07-12 NOTE — Telephone Encounter (Signed)
Requested Prescriptions  Pending Prescriptions Disp Refills  . amLODipine (NORVASC) 2.5 MG tablet [Pharmacy Med Name: AMLODIPINE BESYLATE 2.5 MG TAB] 90 tablet 0    Sig: TAKE 1 TABLET BY MOUTH EVERY DAY     Cardiovascular:  Calcium Channel Blockers Passed - 07/12/2020  1:34 AM      Passed - Last BP in normal range    BP Readings from Last 1 Encounters:  07/06/20 128/78         Passed - Valid encounter within last 6 months    Recent Outpatient Visits          1 month ago Morbid obesity [facility])   [facility] [name], MD   3 months ago Viral upper respiratory tract infection   [facility] Welford Roche D, MD   4 months ago History of CVA (cerebrovascular accident) without residual deficits   [facility] Var[place], Danna Hefty, MD   7 months ago Atrial fibrillation with rapid ventricular response [facility])   [facility] [name], MD   7 months ago Productive cough   [facility] [name], MD      Future Appointments            In 2 months Carlynn Purl, Danna Hefty, MD [facility], [facility]

## 2020-07-17 ENCOUNTER — Ambulatory Visit (INDEPENDENT_AMBULATORY_CARE_PROVIDER_SITE_OTHER): Payer: BC Managed Care – PPO | Admitting: Adult Health

## 2020-07-17 ENCOUNTER — Encounter (INDEPENDENT_AMBULATORY_CARE_PROVIDER_SITE_OTHER): Payer: Self-pay | Admitting: Adult Health

## 2020-07-17 ENCOUNTER — Other Ambulatory Visit: Payer: Self-pay

## 2020-07-17 ENCOUNTER — Telehealth: Payer: Self-pay | Admitting: Cardiology

## 2020-07-17 VITALS — BP 142/84 | HR 68 | Temp 97.6°F | Ht 66.0 in | Wt 261.0 lb

## 2020-07-17 DIAGNOSIS — E559 Vitamin D deficiency, unspecified: Secondary | ICD-10-CM

## 2020-07-17 DIAGNOSIS — Z9189 Other specified personal risk factors, not elsewhere classified: Secondary | ICD-10-CM | POA: Diagnosis not present

## 2020-07-17 DIAGNOSIS — Z6841 Body Mass Index (BMI) 40.0 and over, adult: Secondary | ICD-10-CM | POA: Diagnosis not present

## 2020-07-17 DIAGNOSIS — I10 Essential (primary) hypertension: Secondary | ICD-10-CM

## 2020-07-17 DIAGNOSIS — I4891 Unspecified atrial fibrillation: Secondary | ICD-10-CM

## 2020-07-17 MED ORDER — VITAMIN D (ERGOCALCIFEROL) 1.25 MG (50000 UNIT) PO CAPS
50000.0000 [IU] | ORAL_CAPSULE | ORAL | 0 refills | Status: DC
Start: 2020-07-17 — End: 2020-11-14

## 2020-07-17 NOTE — Telephone Encounter (Signed)
Patient calling to check status of appointment with Pharmacy and lovenox rx for upcoming surgery .

## 2020-07-18 NOTE — Progress Notes (Signed)
Chief Complaint:   OBESITY Regina Christensen is here to discuss her progress with her obesity treatment plan along with follow-up of her obesity related diagnoses. Regina Christensen is on keeping a food journal and adhering to recommended goals of 1000-1100 calories and 65 g protein and states she is following her eating plan approximately 90% of the time. Regina Christensen states she is doing 0 minutes 0 times per week.  Today's visit was #: 5 Starting weight: 277 lbs Starting date: 05/10/2020 Today's weight: 261 lbs Today's date: 07/17/2020 Total lbs lost to date: 16 lbs Total lbs lost since last in-office visit: 0  Interim History: Regina Christensen ate similar foods the last 2 weeks, however, she felt the portion sizes were increased.  She reports following category 1 meal plan not journaling plan.   Subjective:   1. Vitamin D deficiency Milo's Vitamin D level was well below goal of 50 at 6.1 on 03/11/2020. She is currently taking prescription vitamin D 50,000 IU each week. She denies nausea, vomiting or muscle weakness.  2. Essential hypertension Regina Christensen's BP is slightly elevated today. She is taking Norvasc 5 mg QD, metoprolol tartrate 100 mg BID, and Benicar 40 mg QD. She denies cardiac symptoms.  BP Readings from Last 3 Encounters:  07/17/20 (!) 142/84  07/06/20 128/78  06/28/20 128/81    3. At risk for osteoporosis Regina Christensen is at higher risk of osteopenia and osteoporosis due to Vitamin D deficiency and obesity.  Assessment/Plan:   1. Vitamin D deficiency Low Vitamin D level contributes to fatigue and are associated with obesity, breast, and colon cancer. She agrees to continue to take prescription Vitamin D @50 ,000 IU every week and will follow-up for routine testing of Vitamin D, at least 2-3 times per year to avoid over-replacement.  - Vitamin D, Ergocalciferol, (DRISDOL) 1.25 MG (50000 UNIT) CAPS capsule; Take 1 capsule (50,000 Units total) by mouth every 7 (seven) days.  Dispense: 4  capsule; Refill: 0  2. Essential hypertension Regina Christensen is working on healthy weight loss and exercise to improve blood pressure control. We will watch for signs of hypotension as she continues her lifestyle modifications. Continue current anti-hypertensive regimen. Limit sodium intake.  3. At risk for osteoporosis Regina Christensen was given approximately 15 minutes of osteoporosis prevention counseling today. Regina Christensen is at risk for osteopenia and osteoporosis due to her Vitamin D deficiency. She was encouraged to take her Vitamin D and follow her higher calcium diet and increase strengthening exercise to help strengthen her bones and decrease her risk of osteopenia and osteoporosis.  Repetitive spaced learning was employed today to elicit superior memory formation and behavioral change.  4. Class 3 severe obesity with serious comorbidity and body mass index (BMI) of 40.0 to 44.9 in adult, unspecified obesity type (HCC) Regina Christensen is currently in the action stage of change. As such, her goal is to continue with weight loss efforts. She has agreed to the Category 1 Plan.   Ensure snack calories <= 100 per day.  Exercise goals: No exercise has been prescribed at this time.  Behavioral modification strategies: increasing lean protein intake, decreasing simple carbohydrates, meal planning and cooking strategies and planning for success.  Regina Christensen has agreed to follow-up with our clinic in 4 weeks. She was informed of the importance of frequent follow-up visits to maximize her success with intensive lifestyle modifications for her multiple health conditions.   Objective:   Blood pressure (!) 142/84, pulse 68, temperature 97.6 F (36.4 C), height 5\' 6"  (1.676 m), weight 261  lb (118.4 kg), SpO2 99 %. Body mass index is 42.13 kg/m.  General: Cooperative, alert, well developed, in no acute distress. HEENT: Conjunctivae and lids unremarkable. Cardiovascular: Regular rhythm.  Lungs: Normal work of  breathing. Neurologic: No focal deficits.   Lab Results  Component Value Date   CREATININE 0.77 03/02/2020   BUN 10 03/02/2020   NA 139 03/02/2020   K 3.2 (L) 03/02/2020   CL 105 03/02/2020   CO2 25 03/02/2020   Lab Results  Component Value Date   ALT 13 02/29/2020   AST 14 (L) 02/29/2020   ALKPHOS 79 02/29/2020   BILITOT 0.4 02/29/2020   Lab Results  Component Value Date   HGBA1C 6.2 (H) 03/01/2020   HGBA1C 6.3 (H) 11/29/2019   HGBA1C 6.3 (H) 02/03/2019   HGBA1C 5.8 (H) 02/05/2018   HGBA1C 5.8 (H) 02/24/2017   No results found for: INSULIN Lab Results  Component Value Date   TSH 1.358 03/01/2020   Lab Results  Component Value Date   CHOL 132 03/01/2020   HDL 69 03/01/2020   LDLCALC 49 03/01/2020   TRIG 69 03/01/2020   CHOLHDL 1.9 03/01/2020   Lab Results  Component Value Date   WBC 7.9 03/02/2020   HGB 13.4 03/02/2020   HCT 41.4 03/02/2020   MCV 90.2 03/02/2020   PLT 279 03/02/2020    Attestation Statements:   Reviewed by clinician on day of visit: allergies, medications, problem list, medical history, surgical history, family history, social history, and previous encounter notes.  Edmund Hilda, am acting as Energy manager for William Hamburger, NP.  I have reviewed the above documentation for accuracy and completeness, and I agree with the above. -  Vernelle Wisner d. Clayton Bosserman, NP-C

## 2020-07-18 NOTE — Telephone Encounter (Signed)
Spoke w/ pt.  She is agreeable to coming in on 08/01/20, but needs early in the am due to work. Added her on to my schedule @ 9:00.  She is appreciative and will call back w/ any questions or concerns.

## 2020-07-18 NOTE — Telephone Encounter (Addendum)
Attempted to call pt, but no answer. Lmom for her to call back to schedule appt w/ me either 4/6 or 4/13. Will put her in new pt slot in am or pm on either of these days.

## 2020-07-18 NOTE — Telephone Encounter (Signed)
  08/05/20: Continue taking Eliquis as prescribed  08/06/20: Inject enoxaparin 120mg in the fatty tissue every 12 hours, 8am and 8pm. Do not take Eliquis today  08/07/20: Inject enoxaparin 120mg in the fatty tissue in the morning at 8 am (No PM dose). Do not take Eliquis today  08/08/20: Procedure Day - Do not inject enoxaparin in the morning.  Check with surgeon to see if you can resume Eliquis in the evening   08/09/20: Resume Eliquis as prescribed or when directed by surgeon.   

## 2020-07-18 NOTE — Telephone Encounter (Signed)
Per Cadence's office note on 3/18:  Given stroke off Eliquis will need lovenox bridge as above. Spoke with DOD, Dr. Azucena Cecil, who agreed with Lovenox bridging. She was educated on Lovenox injections in the office today. She will call with a surgery date and she will be contacted by our pharmacy team regarding dosing. METS>4. According to Revised cardiac risk index patient is class 3 risk, 10.1% 30 day risk death, MI, or caridac arrest. Overall patient is at an acceptable risk for planned procedure without additional cardiovascular testing.   Spoke to pt this morning. She states sinus surgery is scheduled 08/08/20.  Notified pt pharmacy team will contact pt regarding Lovenox dosing.  Pt verbalized understanding. Forwarding to PharmD pool for follow up.

## 2020-07-26 ENCOUNTER — Other Ambulatory Visit: Payer: Self-pay | Admitting: Psychiatry

## 2020-07-26 DIAGNOSIS — F411 Generalized anxiety disorder: Secondary | ICD-10-CM

## 2020-07-30 ENCOUNTER — Other Ambulatory Visit: Payer: Self-pay

## 2020-07-30 ENCOUNTER — Other Ambulatory Visit
Admission: RE | Admit: 2020-07-30 | Discharge: 2020-07-30 | Disposition: A | Discharge status: Home / Self Care | Payer: BC Managed Care – PPO | Source: Ambulatory Visit | Attending: Otolaryngology | Admitting: Otolaryngology

## 2020-07-30 ENCOUNTER — Telehealth: Payer: Self-pay | Admitting: Cardiology

## 2020-07-30 ENCOUNTER — Telehealth: Payer: Self-pay

## 2020-07-30 DIAGNOSIS — I10 Essential (primary) hypertension: Secondary | ICD-10-CM

## 2020-07-30 HISTORY — DX: Prediabetes: R73.03

## 2020-07-30 HISTORY — DX: Other complications of anesthesia, initial encounter: T88.59XA

## 2020-07-30 MED ORDER — AMLODIPINE BESYLATE 5 MG PO TABS: 5.0000 mg | ORAL_TABLET | Freq: Every day | ORAL | 0 refills | Status: DC

## 2020-07-30 NOTE — Telephone Encounter (Signed)
[facility] never got her [facility] for 5mg  of amlodipine per pt. She was told to up her meds and that's why she is out

## 2020-07-30 NOTE — Patient Instructions (Addendum)
Your procedure is scheduled on: Wednesday August 08, 2020. Report to [facility] inside [facility] 2nd floor, stop by admissions desk first before getting on elevator. To find out your arrival time please call [phone] between 1PM - 3PM on Tuesday August 07, 2020.  Remember: Instructions that are not followed completely may result in serious medical risk,  up to and including death, or upon the discretion of your surgeon and anesthesiologist your  surgery may need to be rescheduled.     _X__ 1. Do not eat food after midnight the night before your procedure.                 No chewing gum or hard candies. You may drink clear liquids up to 2 hours                 before you are scheduled to arrive for your surgery- DO not drink clear                 liquids within 2 hours of the start of your surgery.                 Clear Liquids include:  water, apple juice without pulp, clear Gatorade, G2 or                  Gatorade Zero (avoid Red/Purple/Blue), Black Coffee or Tea (Do not add  __X__2.  On the morning of surgery brush your teeth with toothpaste and water, you                may rinse your mouth with mouthwash if you wish.  Do not swallow any toothpaste of mouthwash.     _X__ 3.  No Alcohol for 24 hours before or after surgery.   _X__ 4.  Do Not Smoke or use e-cigarettes For 24 Hours Prior to Your Surgery.                 Do not use any chewable tobacco products for at least 6 hours prior to                 Surgery.  _X__  5.  Do not use any recreational drugs (marijuana, cocaine, heroin, ecstasy, MDMA or other)                For at least one week prior to your surgery.  Combination of these drugs with anesthesia                May have life threatening results.  _X___ 6.  Notify your doctor if there is any change in your medical condition      (cold, fever, infections).     Do not wear jewelry, make-up, hairpins, clips or nail polish. Do not wear  lotions, powders, or perfumes. You may wear deodorant. Do not shave 48 hours prior to surgery.  Do not bring valuables to the hospital.    [facility] is not responsible for any belongings or valuables.  Contacts, dentures or bridgework may not be worn into surgery. Leave your suitcase in the car. After surgery it may be brought to your room. For patients admitted to the hospital, discharge time is determined by your treatment team.   Patients discharged the day of surgery will not be allowed to drive home.   Make arrangements for someone to be with you for the first 24 hours of your Same Day Discharge.   __X__ Take these  medicines the morning of surgery with A SIP OF WATER:    1. amLODipine (NORVASC) 5 MG   2. famotidine (PEPCID) 20 MG  3. metoprolol tartrate (LOPRESSOR) 100 MG  4.venlafaxine XR (EFFEXOR-XR) 150 MG   5.  6.  ____ Fleet Enema (as directed)   ____ Use CHG Soap (or wipes) as directed  ____ Use Benzoyl Peroxide Gel as instructed  __X__ Use inhalers on the day of surgery  triamcinolone (NASACORT) 55 MCG/ACT AERO nasal inhaler  ____ Stop metformin 2 days prior to surgery    ____ Take 1/2 of usual insulin dose the night before surgery. No insulin the morning          of surgery.   __X__ Stop apixaban (ELIQUIS) 5 MG as instructed by your cardiologist.   __X__ Stop Anti-inflammatories such as Ibuprofen, Aleve, Advil, naproxen, aspirin, Goody's or BC powders.    __X__ Stop supplements until after surgery.    __X__ Do not start any herbal supplements before your procedure.  __X__ Bring C-Pap to the hospital if in good standing and using it.     If you have any questions regarding your pre-procedure instructions,  Please call Pre-admit Testing at [phone].

## 2020-07-30 NOTE — Telephone Encounter (Signed)
Patient states her Eliquis needs Prior Authorization. Please call to discuss.

## 2020-07-30 NOTE — Telephone Encounter (Signed)
Regina Christensen (Key: [id number]) Eliquis 5MG  tablets   Form [facility] Non-Medicare Formulary Exception / Prior Authorization Request Form   Plan Contact [phone] phone [phone] fax  Created 1 hour ago  Sent to Plan less than a minute ago  Determination N/A  Awaiting Determination

## 2020-08-01 ENCOUNTER — Ambulatory Visit (INDEPENDENT_AMBULATORY_CARE_PROVIDER_SITE_OTHER): Payer: BC Managed Care – PPO

## 2020-08-01 ENCOUNTER — Other Ambulatory Visit: Payer: Self-pay

## 2020-08-01 DIAGNOSIS — Z7901 Long term (current) use of anticoagulants: Secondary | ICD-10-CM

## 2020-08-01 MED ORDER — ENOXAPARIN SODIUM 120 MG/0.8ML ~~LOC~~ SOLN: 120.0000 mg | Freq: Two times a day (BID) | SUBCUTANEOUS | 1 refills | Status: DC

## 2020-08-01 NOTE — Patient Instructions (Signed)
  08/05/20: Continue taking Eliquis as prescribed  08/06/20: Inject enoxaparin 120mg  in the fatty tissue every 12 hours, 8am and 8pm. Do not take Eliquis today  08/07/20: Inject enoxaparin 120mg  in the fatty tissue in the morning at 8 am (No PM dose). Do not take Eliquis today  08/08/20: Procedure Day - Do not inject enoxaparin in the morning.  Check with surgeon to see if you can resume Eliquis in the evening   08/09/20: Resume Eliquis as prescribed or when directed by surgeon.

## 2020-08-01 NOTE — Progress Notes (Signed)
Lovenox protocol will be initiated as out-patient. No contraindications, patient is able to understand regimen and carry out injections. . Initiate at 1 mg per kg Sub-Q BID x 5-7 days until INR stable. Coumadin instructions given. No alcohol or aspirin or NSAID's. Patient warned about abnormal bleeding, progressive DVT symptoms, or appearance of sudden dyspnea or chest pain to suggest pulmonary embolism.  Pt reports that she takes anti-coag for afib and that recently, she is unsure if she is in afib or not.  Her last EKG in March showed NSR, but she report some occasional left sided chest pain that radiates to her arm pit.  She describes the pain as mild. She is usually either watching TV or on the phone when pain starts and it will resolve on her own.  She reports significant family history of heart issues, and her sister is very concerned, so she would like to be seen by cardiologist prior to her sinus surgery next week. Escorted pt to the front desk to make appt w/ APP tomorrow.

## 2020-08-01 NOTE — Telephone Encounter (Signed)
Fax received from [facility] stating  The member's prescription benefit coverage indicates a prior authorization is NOT required for Eliquis. Placed in sample drug closet filing cabinet.

## 2020-08-02 ENCOUNTER — Encounter: Payer: Self-pay | Admitting: Family

## 2020-08-02 ENCOUNTER — Ambulatory Visit: Payer: BC Managed Care – PPO | Admitting: Family

## 2020-08-02 VITALS — BP 132/82 | HR 69 | Ht 68.0 in | Wt 265.0 lb

## 2020-08-02 DIAGNOSIS — Z01810 Encounter for preprocedural cardiovascular examination: Secondary | ICD-10-CM | POA: Diagnosis not present

## 2020-08-02 DIAGNOSIS — Z7901 Long term (current) use of anticoagulants: Secondary | ICD-10-CM | POA: Diagnosis not present

## 2020-08-02 DIAGNOSIS — I5032 Chronic diastolic (congestive) heart failure: Secondary | ICD-10-CM

## 2020-08-02 DIAGNOSIS — I10 Essential (primary) hypertension: Secondary | ICD-10-CM

## 2020-08-02 DIAGNOSIS — Z8673 Personal history of transient ischemic attack (TIA), and cerebral infarction without residual deficits: Secondary | ICD-10-CM

## 2020-08-02 DIAGNOSIS — I480 Paroxysmal atrial fibrillation: Secondary | ICD-10-CM | POA: Diagnosis not present

## 2020-08-02 NOTE — Progress Notes (Signed)
Office Visit    Patient Name: Regina Christensen Date of Encounter: 08/02/2020  PCP:  Alba CorySowles, Krichna, MD   [facility]  Cardiologist:  Debbe OdeaBrian Agbor-Etang, MD  Advanced Practice Provider:  No care team member to display Electrophysiologist:  None   Chief Complaint    Regina Christensen is a 65 y.o. female with a hx of hypertension, vitamin D deficiency, prediabetes, obesity s/p sleeve gastrectomy, PAF on Eliquis, obesity, sleep apnea, CVA 02/2020 in the setting of Eliquis noncompliance presents today for preoperative evaluation prior to sinus surgery  Past Medical History    Past Medical History:  Diagnosis Date  . Arthritis   . Atrial fibrillation (HCC)   . Back pain   . Complication of anesthesia    Always needs oxygen when under anesthesia after waking up   . Depression   . Edema of both lower extremities   . GERD (gastroesophageal reflux disease)   . Hypertension   . IBS (irritable bowel syndrome)   . Lactose intolerance   . Morbid obesity with BMI of 45.0-49.9, adult (HCC)   . Obesity   . Osteoarthritis   . Pre-diabetes   . Prediabetes   . Sinus infection   . Sleep apnea    uses CPAP machine sometimes   . Stroke (cerebrum) (HCC)   . Vertigo   . Vitamin D deficiency    Past Surgical History:  Procedure Laterality Date  . ABDOMINAL HYSTERECTOMY    . BREAST EXCISIONAL BIOPSY Left   . ENDOMETRIAL ABLATION     x2  . GASTRIC BYPASS     sleeve  . JOINT REPLACEMENT    . TOTAL KNEE ARTHROPLASTY Right     Allergies  Allergies  Allergen Reactions  . Ace Inhibitors Cough  . Hctz [Hydrochlorothiazide] Rash    face    History of Present Illness    Regina Christensen is a 65 y.o. female with a hx of  hypertension, vitamin D deficiency, prediabetes, obesity s/p sleeve gastrectomy, PAF on Eliquis, obesity, sleep apnea, CVA 02/2020 in the setting of Eliquis noncompliance.  She was last seen 07/06/2020 by Terrilee Croakadence Furth, PA.  Previous  cardiac work-up includes stress test in 2017 with EF 42%, no significant ischemia, no concerning EKG changes, overall low risk scan.   Admitted November 2021 most recent echocardiogram November 2021 LVEF 55 to 60%, no wall motion normalities, mild LVH, grade 1 diastolic function, mildly dilated LA.  Admitted November 2021 after presenting to the ER as outpatient MRI revealed stroke.  She did note missing 2 or 3 doses of Eliquis per week due to falling asleep prior to evening dose.  MRI brain showed 7 x 3 mm infarct involving the right posterior frontal lobe.  No deficits were noted.  She was seen most recently 07/06/2020 for preoperative evaluation prior to sinus surgery.  She was in normal function.  Due to history of CVA in setting of Eliquis noncompliant she requires Lovenox bridging which has been coordinated by our pharmacy team.  At that visit she noted rare palpitations, no chest pain, and mild lower extremity edema which resolved with elevation.  She was working on diet and weight loss with success.  She presents today for follow-up.  Her most recent documented atrial fibrillation is October 2020. She notes she feels a "little bit" of pain near her left chest or under her left armpit. Last a few seconds a few times per day and then is gone as  soon as it starts. Happens mostly at rest.  He wonders whether it is related to her procedure. No shortness of breath, dyspnea on exertion, diaphoresis with the episodes. She has no formal exercise routine but also watches her three year old grandchild.  She denies exertional dyspnea and exertional chest pain. BP readings at home have been 140s/70s though she questions the accuracy of her blood pressure cuff.  EKGs/Labs/Other Studies Reviewed:   The following studies were reviewed today:  Echo 02/2020  1. Left ventricular ejection fraction, by estimation, is 55 to 60%. The  left ventricle has normal function. The left ventricle has no regional  wall  motion abnormalities. There is mild left ventricular hypertrophy.  Left ventricular diastolic parameters  are consistent with Grade I diastolic dysfunction (impaired relaxation).   2. Right ventricular systolic function is normal. The right ventricular  size is normal. There is normal pulmonary artery systolic pressure. The  estimated right ventricular systolic pressure is 22.2 mmHg.   3. Left atrial size was mildly dilated.   4. The mitral valve is normal in structure. Trivial mitral valve  regurgitation. No evidence of mitral stenosis.   5. The aortic valve is tricuspid. Aortic valve regurgitation is not  visualized. No aortic stenosis is present.   6. The inferior vena cava is normal in size with greater than 50%  respiratory variability, suggesting right atrial pressure of 3 mmHg.   EKG:  EKG is ordered today.  The ekg ordered today demonstrates NSR 69 bpm with no acute ST/T wave changes.  Noted moderate criteria for LVH which is known by her most recent echo.  Recent Labs: 02/29/2020: ALT 13 03/01/2020: TSH 1.358 03/02/2020: BUN 10; Creatinine, Ser 0.77; Hemoglobin 13.4; Magnesium 2.2; Platelets 279; Potassium 3.2; Sodium 139  Recent Lipid Panel    Component Value Date/Time   CHOL 132 03/01/2020 0306   CHOL 151 12/05/2014 1117   TRIG 69 03/01/2020 0306   HDL 69 03/01/2020 0306   HDL 74 12/05/2014 1117   CHOLHDL 1.9 03/01/2020 0306   VLDL 14 03/01/2020 0306   LDLCALC 49 03/01/2020 0306   LDLCALC 50 11/29/2019 1512    Risk Assessment/Calculations:   CHA2DS2-VASc Score = 6  This indicates a 9.7% annual risk of stroke. The patient's score is based upon: CHF History: Yes HTN History: Yes Diabetes History: Yes Stroke History: Yes Vascular Disease History: No Age Score: 0 Gender Score: 1  Home Medications   Current Meds  Medication Sig  . acetaminophen (TYLENOL) 650 MG CR tablet Take 1,300 mg by mouth every 8 (eight) hours as needed for pain.  Marland Kitchen amLODipine (NORVASC)  5 MG tablet Take 1 tablet (5 mg total) by mouth daily.  Marland Kitchen apixaban (ELIQUIS) 5 MG TABS tablet Take 1 tablet (5 mg total) by mouth 2 (two) times daily.  Marland Kitchen BIOTIN MAXIMUM PO Take by mouth.  . enoxaparin (LOVENOX) 120 MG/0.8ML injection Inject 0.8 mLs (120 mg total) into the skin every 12 (twelve) hours.  . famotidine (PEPCID) 20 MG tablet Take 1 tablet (20 mg total) by mouth daily.  . fexofenadine (ALLEGRA) 180 MG tablet Take 180 mg by mouth daily.  . meclizine (ANTIVERT) 12.5 MG tablet Take 1 tablet (12.5 mg total) by mouth 3 (three) times daily as needed for dizziness.  . metoprolol tartrate (LOPRESSOR) 100 MG tablet Take 1 tablet (100 mg total) by mouth 2 (two) times daily.  . Multiple Vitamins-Minerals (MULTIVITAMIN WITH MINERALS) tablet Take 1 tablet by mouth daily.  Marland Kitchen  olmesartan (BENICAR) 40 MG tablet TAKE 1 TABLET BY MOUTH EVERY DAY  . ramelteon (ROZEREM) 8 MG tablet Take 1 tablet (8 mg total) by mouth at bedtime.  . Semaglutide, 1 MG/DOSE, (OZEMPIC, 1 MG/DOSE,) 2 MG/1.5ML SOPN Inject 1 mg into the skin once a week. Sunday  . triamcinolone (NASACORT) 55 MCG/ACT AERO nasal inhaler Place 2 sprays into the nose daily.  Marland Kitchen venlafaxine XR (EFFEXOR-XR) 150 MG 24 hr capsule TAKE 1 CAPSULE DAILY WITH BREAKFAST  . Vitamin D, Ergocalciferol, (DRISDOL) 1.25 MG (50000 UNIT) CAPS capsule Take 1 capsule (50,000 Units total) by mouth every 7 (seven) days.  Marland Kitchen XIIDRA 5 % SOLN Place 1 drop into both eyes 2 (two) times daily.     Review of Systems  All other systems reviewed and are otherwise negative except as noted above.  Physical Exam    VS:  BP (!) 150/86 (BP Location: Left Arm, Patient Position: Sitting, Cuff Size: Large)   Pulse 69   Ht 5\' 8"  (1.727 m)   Wt 265 lb (120.2 kg)   SpO2 96%   BMI 40.29 kg/m  , BMI Body mass index is 40.29 kg/m.  Wt Readings from Last 3 Encounters:  08/02/20 265 lb (120.2 kg)  07/30/20 261 lb (118.4 kg)  07/17/20 261 lb (118.4 kg)    GEN: Well nourished,  overweight, well developed, in no acute distress. HEENT: normal. Neck: Supple, no JVD, carotid bruits, or masses. Cardiac: RRR, no murmurs, rubs, or gallops. No clubbing, cyanosis, edema.  Radials/DP/PT 2+ and equal bilaterally.  Respiratory:  Respirations regular and unlabored, clear to auscultation bilaterally. GI: Soft, nontender, nondistended. MS: No deformity or atrophy. Skin: Warm and dry, no rash. Neuro:  Strength and sensation are intact. Psych: Normal affect.  Assessment & Plan    1. Preoperative cardiovascular examination - Upcoming sinus surgery. I anticipate her fleeting discomfort to her left chest or under her left armpit is anxiety related. No exertional symptoms suggestive of angina. EKG today no acute ST/T wave changes. According to the Revised Cardiac Risk Index (RCRI), her Perioperative Risk of Major Cardiac Event is (%): 6.6. Her Functional Capacity in METs is: 5.62 according to the Duke Activity Status Index (DASI). She is deemed acceptable risk for the planned procedure without additional cardiovascular intervention.   2. PAF / Chronic anticoagulation -maintaining NSR by EKG today.  Anticipate for fleeting palpitations are PVCs or PACs. Continue Lopressor 100mg  BID. Continue Eliquis 5mg  twice daily.  Denies bleeding complications. She has instructions regarding Lovenox dosing prior to upcoming procedure. CHA2DS2-VASc Score = 6 [CHF History: Yes, HTN History: Yes, Diabetes History: Yes, Stroke History: Yes, Vascular Disease History: No, Age Score: 0, Gender Score: 1].  Therefore, the patient's annual risk of stroke is 9.7 %.     3. HTN -blood pressure mildly elevated.  She is hesitant to adjust medications as her primary care provider recently changed amlodipine to 5 mg after her clonidine patch was discontinued.  Future considerations include increased dose of amlodipine to 10 mg.  Lifestyle changes encouraged.    4. Chronic diastolic heart failure -volume status difficult to  ascertain in the setting of obesity. Weight up 4 pounds but no new edema, dyspnea, orthopnea, nor PND. GDMT includes Olmesartan, Metoprolol. No indication for loop diuretic. Heart healthy diet and regular cardiovascular exercise encouraged.  5. Obesity - Weight loss via diet and exercise encouraged. Discussed the impact being overweight would have on cardiovascular risk.  6. History of CVA - No defecit. Would  likely benefit from statin, will defer to PCP. No aspirin secondary to chronic anticoagulation.   Disposition: Follow up in 3 month(s) with Dr. Azucena Cecil or APP.    Signed, Alver Sorrow, NP 08/02/2020, 10:50 AM [facility]

## 2020-08-02 NOTE — Patient Instructions (Signed)
Medication Instructions:  Continue your current medications.  *If you need a refill on your cardiac medications before your next appointment, please call your pharmacy*   Lab Work: None ordered today.   Testing/Procedures: Your EKG today showed normal sinus rhythm and no changes suggesting blockages.    Follow-Up: At [name], you and your health needs are our priority.  As part of our continuing mission to provide you with exceptional heart care, we have created designated Provider Care Teams.  These Care Teams include your primary Cardiologist (physician) and Advanced Practice Providers (APPs -  Physician Assistants and Nurse Practitioners) who all work together to provide you with the care you need, when you need it.  We recommend signing up for the patient portal called "MyChart".  Sign up information is provided on this After Visit Summary.  MyChart is used to connect with patients for Virtual Visits (Telemedicine).  Patients are able to view lab/test results, encounter notes, upcoming appointments, etc.  Non-urgent messages can be sent to your provider as well.   To learn more about what you can do with MyChart, go to [email or web address].    Your next appointment:   3 month(s)  The format for your next appointment:   In Person  Provider:   You may see Debbe Odea, MD or one of the following Advanced Practice Providers on your designated Care Team:    Nicolasa Ducking, NP  Eula Listen, PA-C  Marisue Ivan, PA-C  Cadence Fransico Michael, [place]  Gillian Shields, NP  Other Instructions  Heart Healthy Diet Recommendations: A low-salt diet is recommended. Meats should be grilled, baked, or boiled. Avoid fried foods. Focus on lean protein sources like fish or chicken with vegetables and fruits. The American Heart Association is a Chief Technology Officer!  American Heart Association Diet and Lifeystyle Recommendations   Exercise recommendations: The American Heart Association  recommends 150 minutes of moderate intensity exercise weekly. Try 30 minutes of moderate intensity exercise 4-5 times per week. This could include walking, jogging, or swimming.  Tips to Measure your Blood Pressure Correctly  Here's what you can do to ensure a correct reading: . Don't drink a caffeinated beverage or smoke during the 30 minutes before the test. . Sit quietly for five minutes before the test begins. . During the measurement, sit in a chair with your feet on the floor and your arm supported so your elbow is at about heart level. . The inflatable part of the cuff should completely cover at least 80% of your upper arm, and the cuff should be placed on bare skin, not over a shirt. . Don't talk during the measurement. . Have your blood pressure measured twice, with a brief break in between. If the readings are different by 5 points or more, have it done a third time.   Blood pressure categories  Blood pressure category SYSTOLIC (upper number)  DIASTOLIC (lower number)  Normal Less than 120 mm Hg and Less than 80 mm Hg  Elevated 120-129 mm Hg and Less than 80 mm Hg  High blood pressure: Stage 1 hypertension 130-139 mm Hg or 80-89 mm Hg  High blood pressure: Stage 2 hypertension 140 mm Hg or higher or 90 mm Hg or higher  Hypertensive crisis (consult your doctor immediately) Higher than 180 mm Hg and/or Higher than 120 mm Hg  Source: American Heart Association and American Stroke Association. For more on getting your blood pressure under control, buy Controlling Your Blood Pressure, a Special Health Report from [facility]  [employer].   Blood Pressure Log   Date   Time  Blood Pressure  Position  Example: Nov 1 9 AM 124/78 sitting

## 2020-08-04 ENCOUNTER — Encounter: Payer: Self-pay | Admitting: Otolaryngology

## 2020-08-04 NOTE — Progress Notes (Signed)
Perioperative Services  Pre-Admission/Anesthesia Testing Clinical Review  Date: 08/06/20  Patient Demographics:  Name: Regina Christensen DOB:   1955/10/19 MRN:   [id number]  Planned Surgical Procedure(s):    Case: [id number] Date/Time: 08/08/20 0815   Procedures:      IMAGE GUIDED SINUS SURGERY (N/A )     ETHMOIDECTOMY (Right )     MAXILLARY ANTROSTOMY with tissue (Bilateral )     ENDOSCOPIC CONCHA BULLOSA RESECTION (Bilateral )   Anesthesia type: General   Pre-op diagnosis: chronic sinusitis   Location: [facility] OR ROOM 09 / [facility]   Surgeons: Vernie Murders, MD    NOTE: Available PAT nursing documentation and vital signs have been reviewed. Clinical nursing staff has updated patient's PMH/PSHx, current medication list, and drug allergies/intolerances to ensure comprehensive history available to assist in medical decision making as it pertains to the aforementioned surgical procedure and anticipated anesthetic course.   Clinical Discussion:  Regina Christensen is a 65 y.o. female who is submitted for pre-surgical anesthesia review and clearance prior to her undergoing the above procedure. Patient has never been a smoker. Pertinent PMH includes: paroxysmal atrial fibrillation, CVA, diastolic heart failure, HTN, pre-diabetes, OSAH (intermittency uses nocturnal PAP therapy), GERD (on daily H2 blocker), BPPV, OA, obesity (BMI 40.29 kg/m), anxiety, depression.  Patient is followed by cardiology Azucena Cecil, MD). She was last seen in the cardiology clinic on 08/02/2020; notes reviewed.  At the time of her clinic visit, patient complained of minor pain in the LEFT side of her chest with (+) radiation into the axilla. Episodes are intermittent in nature and only last a few seconds. Symptom occurs at rest only. She denies shortness or breath, PND, orthopnea, diaphoresis, nausea, palpitations, peripheral edema, vertiginous symptoms, or presyncope/syncope. PMH (+) for  cardiovascular diagnoses:   Patient with PAF. CHA2DS2-VASc Score = 6 (age, sex, HTN, diabetes history, previous CVA, CHF). Patient chronically anticoagulated using apixaban. Patient with previously documented non-compliance with daily anticoagulation therapy leading to her suffering a CVA in 02/2020; MRI showed a 7 x 3 mm infarct of the RIGHT posterior frontal lobe.    Last TTE performed on 03/01/2020 revealed normal left ventricular systolic function with an EF of 55-60%.     Myocardial perfusion imaging study performed back in 10/2015 revealed no evidence of significant stress-induced myocardial ischemia or arrhythmia; reduced LVEF of 42% felt to be related to GI uptake artifact (see full interpretation of cardiovascular testing below).   Patient on GDMT for her HTN diagnosis.  Blood pressure elevated in clinic at 150/86 on currently prescribed CCB, beta locker, and ARB therapies.  Patient was not on lipid lower therapy. Patient with pre-diabetes diagnosis; Hgb A1c 6.2% when last checked on 03/01/2020. Functional capacity, as defined by [facility], is documented as being 5.62 METS.  No changes were made to patient's medication regimen.  Patient to follow-up with outpatient cardiology in 3 months or sooner if needed.  Patient is scheduled to undergo an elective sinus procedure on 08/08/2020 with Dr. Vernie Murders. Given patient's past medical history significant for cardiovascular diagnoses, presurgical cardiac clearance was sought by the performing surgeon's office and PAT team.  Per cardiology, "anticipate her fleeting discomfort to her left chest or under her left armpit is anxiety related. No exertional symptoms suggestive of angina. EKG today no acute ST/T wave changes. According to the ([facility]), her risk of MACE is 6.6%. Therefore, based on patient's past medical history and time since his last clinic visit, patient would be  at an overall ACCEPTABLE risk for the planned procedure without further  cardiovascular testing or intervention at this time". Again, this patient is on daily anticoagulation therapy. Given her history of previous CVA, she will require bridging with enoxaparin. This has been discussed with patient by pharmacy. Patient to hold her 2 days prior to her procedure with plans to restart as soon as postoperative bleeding risk felt to be minimized by her attending surgeon. The patient has been instructed that her last dose of her anticoagulant will be on 08/05/2020.  Patient previous perioperative complications with anesthesia in the past. She notes a history of post-operative hypoxia and advises that she "always requires oxygen after anesthesia". In review of the available records, there are no available anesthesia records for review in the [facility].   Vitals with BMI 08/02/2020 08/02/2020 07/30/2020  Height - 5\' 8"  5\' 8"   Weight - 265 lbs 261 lbs  BMI - 40.3 39.69  Systolic 132 150 -  Diastolic 82 86 -  Pulse - 69 -    Providers/Specialists:   NOTE: Primary physician provider listed below. Patient may have been seen by APP or partner within same practice.   PROVIDER ROLE / SPECIALTY LAST Alinda DeemV  Juengel, Paul, MD Otolaryngology (Surgeon)  06/27/2020  Alba CorySowles, Krichna, MD Primary Care Provider  06/06/2020  Debbe OdeaAgbor-Etang, Brian, MD Cardiology  08/02/2020   Allergies:  Ace inhibitors and Hctz [hydrochlorothiazide]  Current Home Medications:   No current facility-administered medications for this encounter.   Marland Kitchen. acetaminophen (TYLENOL) 650 MG CR tablet  . apixaban (ELIQUIS) 5 MG TABS tablet  . famotidine (PEPCID) 20 MG tablet  . fexofenadine (ALLEGRA) 180 MG tablet  . meclizine (ANTIVERT) 12.5 MG tablet  . metoprolol tartrate (LOPRESSOR) 100 MG tablet  . Multiple Vitamins-Minerals (MULTIVITAMIN WITH MINERALS) tablet  . olmesartan (BENICAR) 40 MG tablet  . ramelteon (ROZEREM) 8 MG tablet  . Semaglutide, 1 MG/DOSE, (OZEMPIC, 1 MG/DOSE,) 2 MG/1.5ML SOPN  .  triamcinolone (NASACORT) 55 MCG/ACT AERO nasal inhaler  . Vitamin D, Ergocalciferol, (DRISDOL) 1.25 MG (50000 UNIT) CAPS capsule  . XIIDRA 5 % SOLN  . amLODipine (NORVASC) 5 MG tablet  . BIOTIN MAXIMUM PO  . enoxaparin (LOVENOX) 120 MG/0.8ML injection  . venlafaxine XR (EFFEXOR-XR) 150 MG 24 hr capsule   History:   Past Medical History:  Diagnosis Date  . Anxiety   . Arthritis   . Back pain   . BPPV (benign paroxysmal positional vertigo)   . Chronic diastolic HF (heart failure) (HCC)   . Complication of anesthesia    Post-operative hypoxia requiring supplemental oxygen  . Current use of long term anticoagulation    Apixaban  . CVA (cerebral vascular accident) (HCC) 02/28/2020   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; imaging from [facility]  . Depression   . Edema of both lower extremities   . GERD (gastroesophageal reflux disease)   . Hypertension   . IBS (irritable bowel syndrome)   . Lactose intolerance   . Morbid obesity with BMI of 45.0-49.9, adult (HCC)   . Obesity   . Osteoarthritis   . PAF (paroxysmal atrial fibrillation) (HCC)   . Pre-diabetes   . Sleep apnea    uses CPAP machine sometimes   . Vitamin D deficiency    Past Surgical History:  Procedure Laterality Date  . ABDOMINAL HYSTERECTOMY    . BREAST EXCISIONAL BIOPSY Left   . ENDOMETRIAL ABLATION     x2  . LAPAROSCOPIC SLEEVE GASTRECTOMY    .  TOTAL KNEE ARTHROPLASTY Right    Family History  Problem Relation Age of Onset  . Diabetes Mother   . High blood pressure Mother   . Heart disease Father   . Cancer Brother   . Mental illness Neg Hx    Social History   Tobacco Use  . Smoking status: Never Smoker  . Smokeless tobacco: Never Used  Vaping Use  . Vaping Use: Never used  Substance Use Topics  . Alcohol use: No    Alcohol/week: 0.0 standard drinks    Comment: rare  . Drug use: No    Pertinent Clinical Results:  LABS: Labs reviewed: Acceptable for surgery.  Hospital Outpatient Visit on  08/06/2020  Component Date Value Ref Range Status  . Sodium 08/06/2020 141  135 - 145 mmol/L Final  . Potassium 08/06/2020 3.4* 3.5 - 5.1 mmol/L Final  . Chloride 08/06/2020 105  98 - 111 mmol/L Final  . CO2 08/06/2020 27  22 - 32 mmol/L Final  . Glucose, Bld 08/06/2020 94  70 - 99 mg/dL Final   Glucose reference range applies only to samples taken after fasting for at least 8 hours.  . BUN 08/06/2020 14  8 - 23 mg/dL Final  . Creatinine, Ser 08/06/2020 0.75  0.44 - 1.00 mg/dL Final  . Calcium 18/56/3149 8.8* 8.9 - 10.3 mg/dL Final  . GFR, Estimated 08/06/2020 >60  >60 mL/min Final   Comment: (NOTE) Calculated using the CKD-EPI Creatinine Equation (2021)   . Anion gap 08/06/2020 9  5 - 15 Final   Performed at [facility], [address] [place] [place], [place] [address]  . WBC 08/06/2020 7.8  4.0 - 10.5 K/uL Final  . RBC 08/06/2020 4.68  3.87 - 5.11 MIL/uL Final  . Hemoglobin 08/06/2020 14.0  12.0 - 15.0 g/dL Final  . HCT 78/58/8502 42.1  36.0 - 46.0 % Final  . MCV 08/06/2020 90.0  80.0 - 100.0 fL Final  . MCH 08/06/2020 29.9  26.0 - 34.0 pg Final  . MCHC 08/06/2020 33.3  30.0 - 36.0 g/dL Final  . RDW 77/41/2878 15.7* 11.5 - 15.5 % Final  . Platelets 08/06/2020 283  150 - 400 K/uL Final  . nRBC 08/06/2020 0.0  0.0 - 0.2 % Final   Performed at [facility], [address] [place], [place] [address]    ECG: Date: 08/02/2020 Time ECG obtained: 1034 AM Rate: 69 bpm Rhythm: Normal sinus rhythm Axis (leads I and aVF): Normal Intervals: PR 138 ms. QRS 78 ms. QTc 392 ms. ST segment and T wave changes: No evidence of acute ST segment elevation or depression Comparison: Similar to previous tracing obtained on 07/06/2020    IMAGING / PROCEDURES: MRA HEAD AND NECK performed on 03/01/2020 1. Normal study of the head and neck  MRI HEAD WITHOUT CONTRAST performed on 02/28/2020 1. 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  2. Moderate leukoaraiosis,  most likely due to chronic microvascular disease.  3. Significant paranasal sinus disease involving the RIGHT ostiomeatal complex  ECHOCARDIOGRAM performed on 03/01/2020 1. Left ventricular ejection fraction, by estimation, is 55 to 60%.  2. The left ventricle has normal function.  3. The left ventricle has no regional wall motion abnormalities.  4. There is mild left ventricular hypertrophy.  5. Left ventricular diastolic parameters are consistent with Grade I diastolic dysfunction (impaired relaxation).  6. Right ventricular systolic function is normal.  7. The right ventricular size is normal.  8. There is normal  pulmonary artery systolic pressure.  9. The estimated right ventricular systolic pressure is 22.2 mmHg.  10. Left atrial size was mildly dilated.  11. The mitral valve is normal in structure. Trivial mitral valve regurgitation. No evidence of mitral stenosis.  12. The aortic valve is tricuspid. Aortic valve regurgitation is not visualized. No aortic stenosis is present.  13. The inferior vena cava is normal in size with greater than 50% respiratory variability, suggesting right atrial pressure of 3 mmHg.   LEXISCAN performed on 11/08/2019 1. Pharmacological myocardial perfusion imaging study with no significant  ischemia 2. Normal wall motion, EF estimated at 42% (likely depressed secondary to GI uptake artifact) 3. No EKG changes concerning for ischemia at peak stress or in recovery. 4. Low risk scan  Impression and Plan:  JADELIN ENG has been referred for pre-anesthesia review and clearance prior to her undergoing the planned anesthetic and procedural courses. Available labs, pertinent testing, and imaging results were personally reviewed by me. This patient has been appropriately cleared by cardiology with an overall ACCEPTABLE risk of significant perioperative cardiovascular complications.  Based on clinical review performed today (08/06/20), barring any significant  acute changes in the patient's overall condition, it is anticipated that she will be able to proceed with the planned surgical intervention. Any acute changes in clinical condition may necessitate her procedure being postponed and/or cancelled. Patient will meet with anesthesia team (MD and/or CRNA) on this day of her procedure for preoperative evaluation/assessment.   Pre-surgical instructions were reviewed with the patient during her PAT appointment and questions were fielded by PAT clinical staff. Patient was advised that if any questions or concerns arise prior to her procedure then she should return a call to PAT and/or her surgeon's office to discuss.  Quentin Mulling, MSN, APRN, FNP-C, CEN [facility]  Peri-operative Services Nurse Practitioner Phone: [phone] 08/06/20 12:22 PM  NOTE: This note has been prepared using Dragon dictation software. Despite my best ability to proofread, there is always the potential that unintentional transcriptional errors may still occur from this process.

## 2020-08-06 ENCOUNTER — Other Ambulatory Visit: Payer: Self-pay

## 2020-08-06 ENCOUNTER — Other Ambulatory Visit: Admission: RE | Admit: 2020-08-06 | Payer: BC Managed Care – PPO | Source: Ambulatory Visit

## 2020-08-06 ENCOUNTER — Encounter
Admission: RE | Admit: 2020-08-06 | Discharge: 2020-08-06 | Disposition: A | Discharge status: Home / Self Care | Payer: BC Managed Care – PPO | Source: Ambulatory Visit | Attending: Otolaryngology | Admitting: Otolaryngology

## 2020-08-06 DIAGNOSIS — Z8673 Personal history of transient ischemic attack (TIA), and cerebral infarction without residual deficits: Secondary | ICD-10-CM | POA: Diagnosis not present

## 2020-08-06 DIAGNOSIS — Z79899 Other long term (current) drug therapy: Secondary | ICD-10-CM | POA: Diagnosis not present

## 2020-08-06 DIAGNOSIS — J3489 Other specified disorders of nose and nasal sinuses: Secondary | ICD-10-CM | POA: Diagnosis not present

## 2020-08-06 DIAGNOSIS — Z01812 Encounter for preprocedural laboratory examination: Secondary | ICD-10-CM | POA: Insufficient documentation

## 2020-08-06 DIAGNOSIS — Z20822 Contact with and (suspected) exposure to covid-19: Secondary | ICD-10-CM | POA: Insufficient documentation

## 2020-08-06 DIAGNOSIS — J320 Chronic maxillary sinusitis: Secondary | ICD-10-CM | POA: Diagnosis not present

## 2020-08-06 DIAGNOSIS — I5032 Chronic diastolic (congestive) heart failure: Secondary | ICD-10-CM | POA: Diagnosis not present

## 2020-08-06 DIAGNOSIS — I480 Paroxysmal atrial fibrillation: Secondary | ICD-10-CM | POA: Diagnosis not present

## 2020-08-06 DIAGNOSIS — J322 Chronic ethmoidal sinusitis: Secondary | ICD-10-CM | POA: Diagnosis not present

## 2020-08-06 DIAGNOSIS — Z7901 Long term (current) use of anticoagulants: Secondary | ICD-10-CM | POA: Diagnosis not present

## 2020-08-06 DIAGNOSIS — K219 Gastro-esophageal reflux disease without esophagitis: Secondary | ICD-10-CM | POA: Diagnosis not present

## 2020-08-06 DIAGNOSIS — I110 Hypertensive heart disease with heart failure: Secondary | ICD-10-CM | POA: Diagnosis not present

## 2020-08-06 DIAGNOSIS — Z6839 Body mass index (BMI) 39.0-39.9, adult: Secondary | ICD-10-CM | POA: Diagnosis not present

## 2020-08-06 LAB — CBC
HCT: 42.1 % (ref 36.0–46.0)
Hemoglobin: 14 g/dL (ref 12.0–15.0)
MCH: 29.9 pg (ref 26.0–34.0)
MCHC: 33.3 g/dL (ref 30.0–36.0)
MCV: 90 fL (ref 80.0–100.0)
Platelets: 283 10*3/uL (ref 150–400)
RBC: 4.68 MIL/uL (ref 3.87–5.11)
RDW: 15.7 % — ABNORMAL HIGH (ref 11.5–15.5)
WBC: 7.8 10*3/uL (ref 4.0–10.5)
nRBC: 0 % (ref 0.0–0.2)

## 2020-08-06 LAB — BASIC METABOLIC PANEL
Anion gap: 9 (ref 5–15)
BUN: 14 mg/dL (ref 8–23)
CO2: 27 mmol/L (ref 22–32)
Calcium: 8.8 mg/dL — ABNORMAL LOW (ref 8.9–10.3)
Chloride: 105 mmol/L (ref 98–111)
Creatinine, Ser: 0.75 mg/dL (ref 0.44–1.00)
GFR, Estimated: >60 mL/min (ref >60)
Glucose, Bld: 94 mg/dL (ref 70–99)
Potassium: 3.4 mmol/L — ABNORMAL LOW (ref 3.5–5.1)
Sodium: 141 mmol/L (ref 135–145)

## 2020-08-06 LAB — SARS CORONAVIRUS 2 (TAT 6-24 HRS): SARS Coronavirus 2: NEGATIVE

## 2020-08-08 ENCOUNTER — Encounter
Admission: RE | Disposition: A | Discharge status: Home / Self Care | Payer: Self-pay | Source: Home / Self Care | Attending: Otolaryngology

## 2020-08-08 ENCOUNTER — Ambulatory Visit: Payer: BC Managed Care – PPO | Admitting: Urgent Care

## 2020-08-08 ENCOUNTER — Other Ambulatory Visit: Payer: Self-pay

## 2020-08-08 ENCOUNTER — Ambulatory Visit
Admission: RE | Admit: 2020-08-08 | Discharge: 2020-08-08 | Disposition: A | Discharge status: Home / Self Care | Payer: BC Managed Care – PPO | Attending: Otolaryngology | Admitting: Otolaryngology

## 2020-08-08 ENCOUNTER — Encounter: Payer: Self-pay | Admitting: Otolaryngology

## 2020-08-08 DIAGNOSIS — Z7901 Long term (current) use of anticoagulants: Secondary | ICD-10-CM | POA: Insufficient documentation

## 2020-08-08 DIAGNOSIS — J3489 Other specified disorders of nose and nasal sinuses: Secondary | ICD-10-CM | POA: Insufficient documentation

## 2020-08-08 DIAGNOSIS — I480 Paroxysmal atrial fibrillation: Secondary | ICD-10-CM | POA: Insufficient documentation

## 2020-08-08 DIAGNOSIS — I5032 Chronic diastolic (congestive) heart failure: Secondary | ICD-10-CM | POA: Insufficient documentation

## 2020-08-08 DIAGNOSIS — J320 Chronic maxillary sinusitis: Secondary | ICD-10-CM | POA: Diagnosis not present

## 2020-08-08 DIAGNOSIS — Z79899 Other long term (current) drug therapy: Secondary | ICD-10-CM | POA: Insufficient documentation

## 2020-08-08 DIAGNOSIS — I110 Hypertensive heart disease with heart failure: Secondary | ICD-10-CM | POA: Insufficient documentation

## 2020-08-08 DIAGNOSIS — Z20822 Contact with and (suspected) exposure to covid-19: Secondary | ICD-10-CM | POA: Insufficient documentation

## 2020-08-08 DIAGNOSIS — K219 Gastro-esophageal reflux disease without esophagitis: Secondary | ICD-10-CM | POA: Insufficient documentation

## 2020-08-08 DIAGNOSIS — Z8673 Personal history of transient ischemic attack (TIA), and cerebral infarction without residual deficits: Secondary | ICD-10-CM | POA: Insufficient documentation

## 2020-08-08 DIAGNOSIS — Z6839 Body mass index (BMI) 39.0-39.9, adult: Secondary | ICD-10-CM | POA: Insufficient documentation

## 2020-08-08 DIAGNOSIS — J322 Chronic ethmoidal sinusitis: Secondary | ICD-10-CM | POA: Insufficient documentation

## 2020-08-08 HISTORY — DX: Chronic diastolic (congestive) heart failure: I50.32

## 2020-08-08 HISTORY — DX: Long term (current) use of anticoagulants: Z79.01

## 2020-08-08 HISTORY — PX: IMAGE GUIDED SINUS SURGERY: SHX6570

## 2020-08-08 HISTORY — DX: Anxiety disorder, unspecified: F41.9

## 2020-08-08 HISTORY — PX: ETHMOIDECTOMY: SHX5197

## 2020-08-08 HISTORY — PX: ENDOSCOPIC CONCHA BULLOSA RESECTION: SHX6395

## 2020-08-08 HISTORY — DX: Benign paroxysmal vertigo, unspecified ear: H81.10

## 2020-08-08 HISTORY — DX: Paroxysmal atrial fibrillation: I48.0

## 2020-08-08 HISTORY — PX: MAXILLARY ANTROSTOMY: SHX2003

## 2020-08-08 LAB — GLUCOSE, CAPILLARY
Glucose-Capillary: 104 mg/dL — ABNORMAL HIGH (ref 70–99)
Glucose-Capillary: 133 mg/dL — ABNORMAL HIGH (ref 70–99)

## 2020-08-08 SURGERY — SINUS SURGERY, WITH IMAGING GUIDANCE
Anesthesia: General | Laterality: Right

## 2020-08-08 MED ORDER — PROPOFOL 10 MG/ML IV BOLUS
INTRAVENOUS | Status: DC | PRN
  Administered 2020-08-08: 180 mg via INTRAVENOUS

## 2020-08-08 MED ORDER — ACETAMINOPHEN 10 MG/ML IV SOLN
INTRAVENOUS | Status: AC
  Filled 2020-08-08: qty 100

## 2020-08-08 MED ORDER — LIDOCAINE-EPINEPHRINE (PF) 1 %-1:200000 IJ SOLN
INTRAMUSCULAR | Status: DC | PRN
  Administered 2020-08-08: 3 mL

## 2020-08-08 MED ORDER — HYDROCODONE-ACETAMINOPHEN 5-325 MG PO TABS: 1.0000 | ORAL_TABLET | Freq: Four times a day (QID) | ORAL | 0 refills | Status: DC | PRN

## 2020-08-08 MED ORDER — FENTANYL CITRATE (PF) 100 MCG/2ML IJ SOLN
INTRAMUSCULAR | Status: AC
  Filled 2020-08-08: qty 2

## 2020-08-08 MED ORDER — MIDAZOLAM HCL 2 MG/2ML IJ SOLN
INTRAMUSCULAR | Status: AC
  Filled 2020-08-08: qty 2

## 2020-08-08 MED ORDER — LIDOCAINE-EPINEPHRINE 1 %-1:100000 IJ SOLN
INTRAMUSCULAR | Status: AC
  Filled 2020-08-08: qty 1

## 2020-08-08 MED ORDER — SUGAMMADEX SODIUM 500 MG/5ML IV SOLN
INTRAVENOUS | Status: DC | PRN
  Administered 2020-08-08: 300 mg via INTRAVENOUS

## 2020-08-08 MED ORDER — ORAL CARE MOUTH RINSE: 15.0000 mL | Freq: Once | OROMUCOSAL | Status: AC

## 2020-08-08 MED ORDER — CEPHALEXIN 500 MG PO CAPS: 500.0000 mg | ORAL_CAPSULE | Freq: Two times a day (BID) | ORAL | 0 refills | Status: DC

## 2020-08-08 MED ORDER — ONDANSETRON HCL 4 MG/2ML IJ SOLN
4.0000 mg | Freq: Once | INTRAMUSCULAR | Status: AC | PRN
  Administered 2020-08-08: 4 mg via INTRAVENOUS

## 2020-08-08 MED ORDER — CHLORHEXIDINE GLUCONATE 0.12 % MT SOLN
OROMUCOSAL | Status: AC
  Filled 2020-08-08: qty 15

## 2020-08-08 MED ORDER — FENTANYL CITRATE (PF) 100 MCG/2ML IJ SOLN
INTRAMUSCULAR | Status: DC | PRN
  Administered 2020-08-08 (×3): 50 ug via INTRAVENOUS
  Administered 2020-08-08 (×2): 25 ug via INTRAVENOUS
  Administered 2020-08-08 (×2): 50 ug via INTRAVENOUS

## 2020-08-08 MED ORDER — MIDAZOLAM HCL 2 MG/2ML IJ SOLN
INTRAMUSCULAR | Status: DC | PRN
  Administered 2020-08-08: 2 mg via INTRAVENOUS

## 2020-08-08 MED ORDER — PHENYLEPHRINE HCL 10 % OP SOLN
OPHTHALMIC | Status: DC | PRN
  Administered 2020-08-08: 10 mL via TOPICAL

## 2020-08-08 MED ORDER — LIDOCAINE HCL (PF) 4 % IJ SOLN
INTRAMUSCULAR | Status: AC
  Filled 2020-08-08: qty 20

## 2020-08-08 MED ORDER — EPHEDRINE SULFATE 50 MG/ML IJ SOLN
INTRAMUSCULAR | Status: DC | PRN
  Administered 2020-08-08: 10 mg via INTRAVENOUS

## 2020-08-08 MED ORDER — FENTANYL CITRATE (PF) 100 MCG/2ML IJ SOLN: 25.0000 ug | INTRAMUSCULAR | Status: DC | PRN

## 2020-08-08 MED ORDER — LIDOCAINE HCL (PF) 2 % IJ SOLN
INTRAMUSCULAR | Status: AC
  Filled 2020-08-08: qty 5

## 2020-08-08 MED ORDER — EPINEPHRINE PF 1 MG/ML IJ SOLN
INTRAMUSCULAR | Status: AC
  Filled 2020-08-08: qty 1

## 2020-08-08 MED ORDER — ROCURONIUM BROMIDE 10 MG/ML (PF) SYRINGE
PREFILLED_SYRINGE | INTRAVENOUS | Status: AC
  Filled 2020-08-08: qty 10

## 2020-08-08 MED ORDER — DEXAMETHASONE SODIUM PHOSPHATE 10 MG/ML IJ SOLN
INTRAMUSCULAR | Status: AC
  Filled 2020-08-08: qty 1

## 2020-08-08 MED ORDER — ACETAMINOPHEN 10 MG/ML IV SOLN
INTRAVENOUS | Status: DC | PRN
  Administered 2020-08-08: 1000 mg via INTRAVENOUS

## 2020-08-08 MED ORDER — PHENYLEPHRINE HCL (PRESSORS) 10 MG/ML IV SOLN
INTRAVENOUS | Status: AC
  Filled 2020-08-08: qty 1

## 2020-08-08 MED ORDER — OXYMETAZOLINE HCL 0.05 % NA SOLN: 2.0000 | Freq: Once | NASAL | Status: AC

## 2020-08-08 MED ORDER — LIDOCAINE HCL (PF) 1 % IJ SOLN
INTRAMUSCULAR | Status: AC
  Filled 2020-08-08: qty 30

## 2020-08-08 MED ORDER — CEFAZOLIN SODIUM-DEXTROSE 2-4 GM/100ML-% IV SOLN
2.0000 g | Freq: Once | INTRAVENOUS | Status: AC
  Administered 2020-08-08: 2 g via INTRAVENOUS

## 2020-08-08 MED ORDER — CHLORHEXIDINE GLUCONATE 0.12 % MT SOLN
15.0000 mL | Freq: Once | OROMUCOSAL | Status: AC
  Administered 2020-08-08: 15 mL via OROMUCOSAL

## 2020-08-08 MED ORDER — ROCURONIUM BROMIDE 100 MG/10ML IV SOLN
INTRAVENOUS | Status: DC | PRN
  Administered 2020-08-08: 55 mg via INTRAVENOUS
  Administered 2020-08-08: 20 mg via INTRAVENOUS

## 2020-08-08 MED ORDER — DEXMEDETOMIDINE (PRECEDEX) IN NS 20 MCG/5ML (4 MCG/ML) IV SYRINGE
PREFILLED_SYRINGE | INTRAVENOUS | Status: DC | PRN
  Administered 2020-08-08 (×4): 4 ug via INTRAVENOUS

## 2020-08-08 MED ORDER — PROPOFOL 10 MG/ML IV BOLUS
INTRAVENOUS | Status: AC
  Filled 2020-08-08: qty 20

## 2020-08-08 MED ORDER — DEXMEDETOMIDINE (PRECEDEX) IN NS 20 MCG/5ML (4 MCG/ML) IV SYRINGE
PREFILLED_SYRINGE | INTRAVENOUS | Status: AC
  Filled 2020-08-08: qty 5

## 2020-08-08 MED ORDER — PREDNISONE 10 MG PO TABS: ORAL_TABLET | ORAL | 0 refills | Status: DC

## 2020-08-08 MED ORDER — OXYMETAZOLINE HCL 0.05 % NA SOLN
NASAL | Status: AC
  Administered 2020-08-08: 2 via NASAL
  Filled 2020-08-08: qty 30

## 2020-08-08 MED ORDER — CEFAZOLIN SODIUM-DEXTROSE 2-4 GM/100ML-% IV SOLN
INTRAVENOUS | Status: AC
  Filled 2020-08-08: qty 100

## 2020-08-08 MED ORDER — SODIUM CHLORIDE 0.9 % IV SOLN: INTRAVENOUS | Status: DC

## 2020-08-08 MED ORDER — PHENYLEPHRINE HCL (PRESSORS) 10 MG/ML IV SOLN
INTRAVENOUS | Status: DC | PRN
  Administered 2020-08-08: 50 ug via INTRAVENOUS

## 2020-08-08 MED ORDER — LIDOCAINE HCL (CARDIAC) PF 100 MG/5ML IV SOSY
PREFILLED_SYRINGE | INTRAVENOUS | Status: DC | PRN
  Administered 2020-08-08: 100 mg via INTRAVENOUS

## 2020-08-08 SURGICAL SUPPLY — 41 items
BATTERY INSTRU NAVIGATION (MISCELLANEOUS) ×8 IMPLANT
BLADE SURG 15 STRL LF DISP TIS (BLADE) ×3 IMPLANT
BLADE SURG 15 STRL SS (BLADE) ×4
BTRY SRG DRVR LF (MISCELLANEOUS) ×6
CNTNR SPEC 2.5X3XGRAD LEK (MISCELLANEOUS) ×6
COAG SUCT 10F 3.5MM HAND CTRL (MISCELLANEOUS) ×4 IMPLANT
CONT SPEC 4OZ STER OR WHT (MISCELLANEOUS) ×2
CONT SPEC 4OZ STRL OR WHT (MISCELLANEOUS) ×6
CONTAINER SPEC 2.5X3XGRAD LEK (MISCELLANEOUS) ×6 IMPLANT
COVER WAND RF STERILE (DRAPES) ×4 IMPLANT
DEFOGGER ANTIFOG KIT (MISCELLANEOUS) ×4 IMPLANT
DRESSING NASL FOAM PST OP SINU (MISCELLANEOUS) IMPLANT
DRSG NASAL FOAM POST OP SINU (MISCELLANEOUS)
ELECT REM PT RETURN 9FT ADLT (ELECTROSURGICAL) ×4
ELECTRODE REM PT RTRN 9FT ADLT (ELECTROSURGICAL) ×3 IMPLANT
GLOVE PROTEXIS LATEX SZ 7.5 (GLOVE) ×8 IMPLANT
GLOVE SURG LATEX 7.5 PF (GLOVE) ×6 IMPLANT
GOWN STRL REUS W/ TWL LRG LVL3 (GOWN DISPOSABLE) ×6 IMPLANT
GOWN STRL REUS W/TWL LRG LVL3 (GOWN DISPOSABLE) ×8
IV NS 500ML (IV SOLUTION) ×4
IV NS 500ML BAXH (IV SOLUTION) ×3 IMPLANT
MANIFOLD NEPTUNE II (INSTRUMENTS) ×4 IMPLANT
NDL ANESTHESIA 27G X 3.5 (NEEDLE) ×3 IMPLANT
NEEDLE ANESTHESIA  27G X 3.5 (NEEDLE) ×4
NEEDLE ANESTHESIA 27G X 3.5 (NEEDLE) ×3 IMPLANT
NS IRRIG 500ML POUR BTL (IV SOLUTION) ×4 IMPLANT
PACK HEAD/NECK (MISCELLANEOUS) ×4 IMPLANT
PACKING NASAL EPIS 4X2.4 XEROG (MISCELLANEOUS) ×6 IMPLANT
PATTIES SURGICAL .5 X3 (DISPOSABLE) ×4 IMPLANT
SHAVER DIEGO BLD STD TYPE A (BLADE) ×4 IMPLANT
SLEEVE SCD COMPRESS KNEE MED (STOCKING) ×1 IMPLANT
SUT CHROMIC 3-0 (SUTURE) ×4
SUT CHROMIC 3-0 KS 27XMFL CR (SUTURE) ×3
SUT ETHILON 3-0 KS 30 BLK (SUTURE) ×4 IMPLANT
SUT PLAIN GUT 4-0 (SUTURE) ×4 IMPLANT
SUTURE CHRMC 3-0 KS 27XMFL CR (SUTURE) ×3 IMPLANT
SYR 20ML LL LF (SYRINGE) ×4 IMPLANT
SYR 3ML LL SCALE MARK (SYRINGE) ×4 IMPLANT
TRACKER CRANIALMASK (MASK) ×4 IMPLANT
TUBING DECLOG MULTIDEBRIDER (TUBING) ×4 IMPLANT
WATER STERILE IRR 1000ML POUR (IV SOLUTION) IMPLANT

## 2020-08-08 NOTE — Transfer of Care (Signed)
Immediate Anesthesia Transfer of Care Note  Patient: Regina Christensen  Procedure(s) Performed: IMAGE GUIDED SINUS SURGERY, RIGHT FRONTAL SINUSOTOMY (N/A ) ETHMOIDECTOMY, LEFT PARTIAL ETHMOIDECTOMY (Right ) MAXILLARY ANTROSTOMY with tissue (Bilateral ) ENDOSCOPIC CONCHA BULLOSA RESECTION (Bilateral )  Patient Location: PACU  Anesthesia Type:General  Level of Consciousness: awake and drowsy  Airway & Oxygen Therapy: Patient Spontanous Breathing and Patient connected to face mask oxygen  Post-op Assessment: Report given to RN and Post -op Vital signs reviewed and stable  Post vital signs: Reviewed and stable  Last Vitals:  Vitals Value Taken Time  BP 184/92 08/08/20 1122  Temp    Pulse 72 08/08/20 1127  Resp 17 08/08/20 1127  SpO2 95 % 08/08/20 1127  Vitals shown include unvalidated device data.  Last Pain:  Vitals:   08/08/20 0751  TempSrc: Temporal  PainSc: 0-No pain      Patients Stated Pain Goal: 0 (08/08/20 0751)  Complications: No complications documented.

## 2020-08-08 NOTE — Progress Notes (Signed)
Spoke to Dr. Elenore Rota about when to restart her Eliquis he stated tomorrow evening would be appropriate as long as bleeding was controlled.

## 2020-08-08 NOTE — Anesthesia Procedure Notes (Signed)
Procedure Name: Intubation Date/Time: 08/08/2020 8:40 AM Performed by: Henrietta Hoover, CRNA Pre-anesthesia Checklist: Patient identified, Patient being monitored, Timeout performed, Emergency Drugs available and Suction available Patient Re-evaluated:Patient Re-evaluated prior to induction Oxygen Delivery Method: Circle system utilized Preoxygenation: Pre-oxygenation with 100% oxygen Induction Type: IV induction Ventilation: Mask ventilation without difficulty Laryngoscope Size: 3 and McGraph Grade View: Grade II Tube type: Oral Rae Tube size: 7.0 mm Number of attempts: 1 Airway Equipment and Method: Stylet Placement Confirmation: ETT inserted through vocal cords under direct vision,  positive ETCO2 and breath sounds checked- equal and bilateral Secured at: 21 cm Tube secured with: Tape Dental Injury: Teeth and Oropharynx as per pre-operative assessment

## 2020-08-08 NOTE — H&P (Signed)
H&P has been reviewed and patient reevaluated, no changes necessary. To be downloaded later.  

## 2020-08-08 NOTE — Anesthesia Postprocedure Evaluation (Signed)
Anesthesia Post Note  Patient: Regina Christensen  Procedure(s) Performed: IMAGE GUIDED SINUS SURGERY, RIGHT FRONTAL SINUSOTOMY (N/A ) ETHMOIDECTOMY, LEFT PARTIAL ETHMOIDECTOMY (Right ) MAXILLARY ANTROSTOMY with tissue (Bilateral ) ENDOSCOPIC CONCHA BULLOSA RESECTION (Bilateral )  Patient location during evaluation: PACU Anesthesia Type: General Level of consciousness: awake and alert Pain management: pain level controlled Vital Signs Assessment: post-procedure vital signs reviewed and stable Respiratory status: spontaneous breathing and respiratory function stable Cardiovascular status: stable Anesthetic complications: no   No complications documented.   Last Vitals:  Vitals:   08/08/20 1130 08/08/20 1145  BP: (!) 157/86 (!) 178/90  Pulse: 79 78  Resp: 14 15  Temp:    SpO2: 95% 97%    Last Pain:  Vitals:   08/08/20 1145  TempSrc:   PainSc: Asleep                 [name]

## 2020-08-08 NOTE — Anesthesia Preprocedure Evaluation (Signed)
Anesthesia Evaluation  Patient identified by MRN, date of birth, ID band Patient awake    Reviewed: Allergy & Precautions, NPO status , Patient's Chart, lab work & pertinent test results  History of Anesthesia Complications (+) history of anesthetic complications (post-op HA)  Airway Mallampati: III       Dental   Pulmonary sleep apnea (not using CPAP often) and Continuous Positive Airway Pressure Ventilation , neg COPD, Not current smoker,           Cardiovascular hypertension, Pt. on medications (-) Past MI and (-) CHF (-) dysrhythmias (-) Valvular Problems/Murmurs     Neuro/Psych neg Seizures Anxiety Depression CVA (found on MRI), No Residual Symptoms    GI/Hepatic Neg liver ROS, GERD  Medicated and Controlled,  Endo/Other  diabetes, Type obesity  Renal/GU negative Renal ROS     Musculoskeletal   Abdominal   Peds  Hematology   Anesthesia Other Findings   Reproductive/Obstetrics                             Anesthesia Physical Anesthesia Plan  ASA: III  Anesthesia Plan: General   Post-op Pain Management:    Induction: Intravenous  PONV Risk Score and Plan: 3 and Ondansetron, Dexamethasone and Treatment may vary due to age or medical condition  Airway Management Planned: Oral ETT  Additional Equipment:   Intra-op Plan:   Post-operative Plan:   Informed Consent: I have reviewed the patients History and Physical, chart, labs and discussed the procedure including the risks, benefits and alternatives for the proposed anesthesia with the patient or authorized representative who has indicated his/her understanding and acceptance.       Plan Discussed with:   Anesthesia Plan Comments:         Anesthesia Quick Evaluation

## 2020-08-08 NOTE — Discharge Instructions (Signed)
To follow-up in the office in 5 to 7 days to evaluate sinuses and suction away any remaining xerogel.  Rest at home with her head elevated.   Start saline flushes tomorrow.  Keflex and prednisone starting tomorrow and has some Norco 5/325 to use for pain as needed.

## 2020-08-08 NOTE — Op Note (Signed)
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, chronic bilateral ethmoid sinusitis, blocked right frontal sinus duct, bilateral concha bullosa of the middle turbinate  Proc: Bilateral endoscopic maxillary antrostomies with removal of contents, right endoscopic total ethmoidectomy with frontal sinusotomy, left endoscopic partial ethmoidectomy, endoscopic trimming of 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  The ethmoid sinuses on both sides were inflame at the ethmoid bulla and the middle ethmoid air cells.  There was a large concha bullosa of both middle turbinates, larger on the right side.  Septum was straight.  She had large boggy inferior turbinates.  She had significant oozing because of the chronic inflammation.  She had good clotting her blood in the nose.  Procedure: The patient was brought to the operating room and placed in a supine position.  She was given general anesthesia by oral endotracheal intubation.  The nose was then prepped using cottonoid pledgets soaked in phenylephrine and Xylocaine.  These were allowed to sit for 10 minutes.  The image guided system was brought in and the CT scan was downloaded from the disc to the system.  The template was applied to the face and was also registered to the system.  There was a 0.6 mm variance.  The suction instruments were then registered to the system and were checked and showed good alignment with the CT scan.  She  was prepped and draped in sterile fashion.  The cotton pledgets were removed and 0 degree scope was used for visualizing both sides of the nose.  The there was thick mucopurulence in the nasopharynx that was running down from both sides of the nose coming from the middle meatus.  3 mL of 1% Xylocaine with epi 1: 100,000 was used for infiltration along the anterior and posterior roots of the middle turbinate, and the uncinate process.    The left side was addressed first with the middle turbinate infractured to provide better access to the middle meatus.  The uncinate process was incised with side biters and was then trimmed and completely removed.  There is lots of inflammation behind this and oozing of blood.  There is mucopurulence that is coming from the maxillary antrum.  This is widened using side biters and large forceps to open this up.  The microdebrider is used for trimming the edges and helping to remove some of this very thickened tissue.  The entire sinuses filled up with creamy white-yellow pus and this is suctioned as much as possible with curved suctions.  There is lots of thick granulation tissue that is in the sinus and using curved forceps as much of this that can be reached is removed.  Is flushed several times to try to clean this out as much as possible.  30 and 70 degree scopes were used to visualize this area to assist with cleaning of the maxillary sinus.  The inflammatory tissue was sent for permanent section.  The ethmoid bulla was very inflamed and this was  opened and there is no pus in here but the tissues are inflamed.  Part of the middle and posterior ethmoid air cells are opened to remove some of the inflammatory tissue.  The get down to more normal tissue and a complete ethmoidectomy is not done on the left-hand side.  The middle turbinate has a concha bullosa and part of the lateral wall of the middle turbinate was trimmed and some of this was removed to open this up.  The middle  turbinate is left medialized to leave a large opening into this middle meatus and maxillary antrum.  Cottonoid pledgets soaked in phenylephrine and lidocaine are placed in here for vasoconstriction.  The right side is then addressed with the 0 degree scope.  The middle turbinate is medialized and the lateral wall of the middle turbinate is removed to open up the very large concha bullosa on this side.  This leaves more room in the middle meatus.  The uncinate process was incised and removed completely.  There is pus this coming out of the middle meatus and maxillary antrum.  The antrum was widened using the microdebrider and backbiting forceps.  This was opened widely to include the natural ostium.  Curved suctions were used to suck away all the mucopus that is filling the maxillary sinus.  Again there is lots of granulation and granular tissue that is scattered around the entire maxillary sinus.  I removed as much of this as I can with curved forceps.  The 30 and 70 degree scopes were used to visualize this area.  It is flushed out to try to clean it as much as possible.  The ethmoid bulla was then opened up and there is chronic inflammatory tissue here.  This is widened with the 0 and 30 degree scopes using the microdebrider and ethmoid forceps to open up all of the posterior ethmoid air cells.  The mucosa of the fovea ethmoidalis seems to be fairly healthy.  The 30 degree scope was used to open up some of the anterior ethmoid air cells.  There is a large agar nasi cell that is opened and a large posterior ethmoid air cell that goes up around the opening to the frontal duct.  Both of these are easily opened but there is a bridge of tissue between them that involves the frontal sinus duct.  This appears to be blocked and needs to be open.  Frontal sinus instruments were used to trim the tissue here and create a good opening into the frontal sinus.  A 70 degree scope was used for this to make sure that this is  open clearly.  Cottonoid pledgets were placed here temporarily with the right side opened up.  The left side is revisualized and the maxillary and ethmoid sinuses are open and clear.  All blood was suctioned away.  The concha bullosa is open.  The ethmoid sinuses are filled with some xerogel help control bleeding and prevent scabbing this area.  A piece is put at the maxillary antrum as well.  This is all wetted to liquefy it.  The right side is then visualized and the cotton pledgets removed.  Bleeding was under control.  The maxillary sinuses open and clear.  The ethmoid sinus is clear and the frontal sinus duct is open and clear.  The concha bullosa is open with no bleeding.  Xerogel was placed in the anterior ethmoid area and then the posterior ethmoid area.  Further xerogel was placed to fill the  middle meatus and prevent lateralization of the middle turbinate.  There are some placed at the opening to the maxillary antrum.  The airways open bilaterally.  She has a large boggy inferior turbinates but there is very little bony structure here.  The inferior turbinates are not addressed.  The patient tolerated the procedure well.  She was awakened and taken to the recovery room in satisfactory condition.  There were no operative complications.  Dispo:   To PACU to be discharged home  Plan: To follow-up in the office in 5 to 7 days to evaluate sinuses and suction away any remaining xerogel.  She will rest at home with her head elevated.  She will start saline flushes tomorrow.  She will be on Keflex and prednisone starting tomorrow and has some Norco 5/325 to use for pain as needed.  She will call if any problems  Regina Christensen  08/08/2020 11:08 AM

## 2020-08-09 ENCOUNTER — Encounter: Payer: Self-pay | Admitting: Otolaryngology

## 2020-08-09 LAB — SURGICAL PATHOLOGY

## 2020-08-13 ENCOUNTER — Ambulatory Visit (INDEPENDENT_AMBULATORY_CARE_PROVIDER_SITE_OTHER): Payer: BC Managed Care – PPO | Admitting: Family Medicine

## 2020-08-15 ENCOUNTER — Other Ambulatory Visit: Payer: Self-pay

## 2020-08-15 ENCOUNTER — Encounter: Payer: Self-pay | Admitting: Emergency Medicine

## 2020-08-15 ENCOUNTER — Inpatient Hospital Stay
Admission: EM | Admit: 2020-08-15 | Discharge: 2020-08-20 | DRG: 151 | Disposition: A | Discharge status: Home / Self Care | Payer: BC Managed Care – PPO | Attending: Internal Medicine | Admitting: Internal Medicine

## 2020-08-15 DIAGNOSIS — B373 Candidiasis of vulva and vagina: Secondary | ICD-10-CM | POA: Diagnosis present

## 2020-08-15 DIAGNOSIS — S42215A Unspecified nondisplaced fracture of surgical neck of left humerus, initial encounter for closed fracture: Secondary | ICD-10-CM | POA: Diagnosis present

## 2020-08-15 DIAGNOSIS — E1129 Type 2 diabetes mellitus with other diabetic kidney complication: Secondary | ICD-10-CM

## 2020-08-15 DIAGNOSIS — K921 Melena: Secondary | ICD-10-CM | POA: Diagnosis present

## 2020-08-15 DIAGNOSIS — D649 Anemia, unspecified: Secondary | ICD-10-CM

## 2020-08-15 DIAGNOSIS — D62 Acute posthemorrhagic anemia: Secondary | ICD-10-CM

## 2020-08-15 DIAGNOSIS — Z833 Family history of diabetes mellitus: Secondary | ICD-10-CM

## 2020-08-15 DIAGNOSIS — R040 Epistaxis: Secondary | ICD-10-CM | POA: Diagnosis not present

## 2020-08-15 DIAGNOSIS — I10 Essential (primary) hypertension: Secondary | ICD-10-CM

## 2020-08-15 DIAGNOSIS — Z96651 Presence of right artificial knee joint: Secondary | ICD-10-CM | POA: Diagnosis present

## 2020-08-15 DIAGNOSIS — Z7901 Long term (current) use of anticoagulants: Secondary | ICD-10-CM

## 2020-08-15 DIAGNOSIS — Z8249 Family history of ischemic heart disease and other diseases of the circulatory system: Secondary | ICD-10-CM

## 2020-08-15 DIAGNOSIS — Z8673 Personal history of transient ischemic attack (TIA), and cerebral infarction without residual deficits: Secondary | ICD-10-CM

## 2020-08-15 DIAGNOSIS — I959 Hypotension, unspecified: Secondary | ICD-10-CM | POA: Diagnosis not present

## 2020-08-15 DIAGNOSIS — Z20822 Contact with and (suspected) exposure to covid-19: Secondary | ICD-10-CM | POA: Diagnosis present

## 2020-08-15 DIAGNOSIS — I480 Paroxysmal atrial fibrillation: Secondary | ICD-10-CM

## 2020-08-15 DIAGNOSIS — I951 Orthostatic hypotension: Secondary | ICD-10-CM | POA: Diagnosis not present

## 2020-08-15 DIAGNOSIS — Z888 Allergy status to other drugs, medicaments and biological substances status: Secondary | ICD-10-CM

## 2020-08-15 DIAGNOSIS — Z6841 Body Mass Index (BMI) 40.0 and over, adult: Secondary | ICD-10-CM

## 2020-08-15 DIAGNOSIS — G4730 Sleep apnea, unspecified: Secondary | ICD-10-CM | POA: Diagnosis present

## 2020-08-15 DIAGNOSIS — Z79899 Other long term (current) drug therapy: Secondary | ICD-10-CM

## 2020-08-15 DIAGNOSIS — I110 Hypertensive heart disease with heart failure: Secondary | ICD-10-CM | POA: Diagnosis present

## 2020-08-15 DIAGNOSIS — F32A Depression, unspecified: Secondary | ICD-10-CM

## 2020-08-15 DIAGNOSIS — F419 Anxiety disorder, unspecified: Secondary | ICD-10-CM | POA: Diagnosis present

## 2020-08-15 DIAGNOSIS — D72829 Elevated white blood cell count, unspecified: Secondary | ICD-10-CM | POA: Diagnosis present

## 2020-08-15 DIAGNOSIS — I5032 Chronic diastolic (congestive) heart failure: Secondary | ICD-10-CM | POA: Diagnosis present

## 2020-08-15 DIAGNOSIS — E1159 Type 2 diabetes mellitus with other circulatory complications: Secondary | ICD-10-CM

## 2020-08-15 DIAGNOSIS — E119 Type 2 diabetes mellitus without complications: Secondary | ICD-10-CM

## 2020-08-15 DIAGNOSIS — I152 Hypertension secondary to endocrine disorders: Secondary | ICD-10-CM

## 2020-08-15 DIAGNOSIS — K219 Gastro-esophageal reflux disease without esophagitis: Secondary | ICD-10-CM

## 2020-08-15 HISTORY — DX: Type 2 diabetes mellitus without complications: E11.9

## 2020-08-15 MED ORDER — TRANEXAMIC ACID FOR EPISTAXIS
500.0000 mg | Freq: Once | TOPICAL | Status: AC
  Administered 2020-08-16: 500 mg via TOPICAL
  Filled 2020-08-15: qty 10

## 2020-08-15 MED ORDER — OXYMETAZOLINE HCL 0.05 % NA SOLN
1.0000 | Freq: Once | NASAL | Status: AC
  Administered 2020-08-15: 1 via NASAL
  Filled 2020-08-15: qty 30

## 2020-08-15 NOTE — ED Provider Notes (Addendum)
[facility] Emergency Department Provider Note  ____________________________________________   Event Date/Time   First MD Initiated Contact with Patient 08/15/20 2306     (approximate)  I have reviewed the triage vital signs and the nursing notes.   HISTORY  Chief Complaint Epistaxis    HPI Regina Christensen is a 65 y.o. female with medical history as listed below who notably is on Eliquis and 1 week ago had the following procedures performed by Dr. Elenore Rota:  Bilateral endoscopic maxillary antrostomies with removal of contents, right endoscopic total ethmoidectomy with frontal sinusotomy, left endoscopic partial ethmoidectomy, endoscopic trimming of bilateral concha bullosa of the middle turbinates.  She presents tonight for evaluation of acute onset and at least moderate to severe left-sided nosebleed.  She reports that it started for no particular reason with no recent trauma and has been continuous for the last 2 hours.  She tried putting some pressure on her nose but it did not stop the bleeding.  She had no Afrin at home.  She tried calling the on-call ENT surgeon, Dr. Willeen Cass, and he recommended trying Afrin nasal spray and then coming into the ED if it is not controlled.  She did not have any Afrin and applying gentle pressure did not help.  She is having some  slow but steady bleeding from the left nostril.  She feels like she has blood or clots in the back of her throat.  Nothing particular makes it better or worse and she describes it as at least moderate in severity.        Past Medical History:  Diagnosis Date  . Anxiety   . Arthritis   . Back pain   . BPPV (benign paroxysmal positional vertigo)   . Chronic diastolic HF (heart failure) (HCC)   . Complication of anesthesia    Post-operative hypoxia requiring supplemental oxygen  . Current use of long term anticoagulation    Apixaban  . CVA (cerebral vascular accident) (HCC) 02/28/2020   7  x 3 mm posterior frontal lobe infarct; imaging from [facility]  . Depression   . Edema of both lower extremities   . GERD (gastroesophageal reflux disease)   . Hypertension   . IBS (irritable bowel syndrome)   . Lactose intolerance   . Morbid obesity with BMI of 45.0-49.9, adult (HCC)   . Obesity   . Osteoarthritis   . PAF (paroxysmal atrial fibrillation) (HCC)   . Pre-diabetes   . Sleep apnea    uses CPAP machine sometimes   . Vitamin D deficiency     Patient Active Problem List   Diagnosis Date Noted  . Epistaxis 08/16/2020  . Unspecified nondisplaced fracture of surgical neck of left humerus, initial encounter for closed fracture 08/16/2020  . Prediabetes 06/12/2020  . Vitamin D deficiency 04/12/2020  . History of CVA (cerebrovascular accident) without residual deficits 03/05/2020  . Vertigo 03/01/2020  . Acute CVA (cerebrovascular accident) (HCC) 02/29/2020  . Headache disorder 12/20/2019  . Leukocytosis 07/19/2019  . History of hysterectomy 07/19/2019  . Drowsy 07/19/2019  . Anxiety 07/19/2019  . MDD (major depressive disorder), recurrent, in full remission (HCC) 07/19/2019  . 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 History of total knee arthroplasty 06/06/2019  . A-fib (HCC) 02/08/2019  . Atrial fibrillation with rapid ventricular response (HCC) 02/07/2019  . Atrial fibrillation, new onset (HCC) 02/03/2019  . Bilateral carpal tunnel syndrome 07/30/2016  . OSA (obstructive sleep apnea) 03/07/2016  . MDD (major depressive disorder),  recurrent episode, mild (HCC) 11/30/2015  . Osteoarthritis of both knees 11/30/2015  . BPPV (benign paroxysmal positional vertigo) 08/28/2015  . Essential hypertension 11/09/2014  . Hyperglycemia 11/09/2014  . GAD (generalized anxiety disorder) 11/09/2014  . Acanthosis nigricans 11/09/2014  . Class 3 severe obesity with serious comorbidity and body mass index (BMI) of 40.0 to 44.9 in adult [facility]) 01/06/2012    Past Surgical History:   Procedure Laterality Date  . ABDOMINAL HYSTERECTOMY    . BREAST EXCISIONAL BIOPSY Left   . ENDOMETRIAL ABLATION     x2  . ENDOSCOPIC CONCHA BULLOSA RESECTION Bilateral 08/08/2020   Procedure: ENDOSCOPIC CONCHA BULLOSA RESECTION;  Surgeon: Vernie Murders, MD;  Location: [facility] ORS;  Service: ENT;  Laterality: Bilateral;  . ETHMOIDECTOMY Right 08/08/2020   Procedure: ETHMOIDECTOMY, LEFT PARTIAL ETHMOIDECTOMY;  Surgeon: Vernie Murders, MD;  Location: [facility] ORS;  Service: ENT;  Laterality: Right;  . IMAGE GUIDED SINUS SURGERY N/A 08/08/2020   Procedure: IMAGE GUIDED SINUS SURGERY, RIGHT FRONTAL SINUSOTOMY;  Surgeon: Vernie Murders, MD;  Location: [facility] ORS;  Service: ENT;  Laterality: N/A;  . LAPAROSCOPIC SLEEVE GASTRECTOMY    . MAXILLARY ANTROSTOMY Bilateral 08/08/2020   Procedure: MAXILLARY ANTROSTOMY with tissue;  Surgeon: Vernie Murders, MD;  Location: [facility] ORS;  Service: ENT;  Laterality: Bilateral;  . TOTAL KNEE ARTHROPLASTY Right     Prior to Admission medications   Medication Sig Start Date End Date Taking? Authorizing Provider  acetaminophen (TYLENOL) 650 MG CR tablet Take 1,300 mg by mouth every 8 (eight) hours as needed for pain.   Yes [provider]  amLODipine (NORVASC) 5 MG tablet Take 1 tablet (5 mg total) by mouth daily. 07/30/20  Yes Sowles, Danna Hefty, MD  apixaban (ELIQUIS) 5 MG TABS tablet Take 1 tablet (5 mg total) by mouth 2 (two) times daily. 04/09/20  Yes Debbe Odea, MD  famotidine (PEPCID) 20 MG tablet Take 1 tablet (20 mg total) by mouth daily. 06/06/20  Yes Sowles, Danna Hefty, MD  meclizine (ANTIVERT) 12.5 MG tablet Take 1 tablet (12.5 mg total) by mouth 3 (three) times daily as needed for dizziness. 06/06/20  Yes Sowles, Danna Hefty, MD  metoprolol tartrate (LOPRESSOR) 100 MG tablet Take 1 tablet (100 mg total) by mouth 2 (two) times daily. 09/09/19  Yes Agbor-Etang, Arlys John, MD  olmesartan (BENICAR) 40 MG tablet TAKE 1 TABLET BY MOUTH EVERY DAY 06/28/20  Yes Sowles, Danna Hefty,  MD  ramelteon (ROZEREM) 8 MG tablet Take 1 tablet (8 mg total) by mouth at bedtime. 04/24/20  Yes Eappen, Levin Bacon, MD  Semaglutide, 1 MG/DOSE, (OZEMPIC, 1 MG/DOSE,) 2 MG/1.5ML SOPN Inject 1 mg into the skin once a week. Sunday 03/05/20  Yes Sowles, Danna Hefty, MD  triamcinolone (NASACORT) 55 MCG/ACT AERO nasal inhaler Place 2 sprays into the nose daily.   Yes [provider]  venlafaxine XR (EFFEXOR-XR) 150 MG 24 hr capsule TAKE 1 CAPSULE DAILY WITH BREAKFAST 07/26/20  Yes Eappen, Levin Bacon, MD  Vitamin D, Ergocalciferol, (DRISDOL) 1.25 MG (50000 UNIT) CAPS capsule Take 1 capsule (50,000 Units total) by mouth every 7 (seven) days. Patient taking differently: Take 50,000 Units by mouth every 7 (seven) days. (Sundays) 07/17/20  Yes Danford, Jinny Blossom, NP  XIIDRA 5 % SOLN Place 1 drop into both eyes 2 (two) times daily. 02/03/17  Yes [provider]  BIOTIN MAXIMUM PO Take by mouth. Patient not taking: Reported on 08/16/2020    [provider]  cephALEXin (KEFLEX) 500 MG capsule Take 1 capsule (500 mg total) by mouth 2 (  two) times daily. Patient not taking: Reported on 08/16/2020 08/08/20   Vernie Murders, MD  enoxaparin (LOVENOX) 120 MG/0.8ML injection Inject 0.8 mLs (120 mg total) into the skin every 12 (twelve) hours. Patient not taking: Reported on 08/16/2020 08/01/20   Debbe Odea, MD  Multiple Vitamins-Minerals (MULTIVITAMIN WITH MINERALS) tablet Take 1 tablet by mouth daily. Patient not taking: Reported on 08/16/2020    [provider]  predniSONE (DELTASONE) 10 MG tablet Start with 3 pills tomorrow. Taper over the next 6 days.  3,3,2,2,1,1. Patient not taking: Reported on 08/16/2020 08/08/20   Vernie Murders, MD    Allergies Ace inhibitors and Hctz [hydrochlorothiazide]  Family History  Problem Relation Age of Onset  . Diabetes Mother   . High blood pressure Mother   . Heart disease Father   . Cancer Brother   . Mental illness Neg Hx     Social History Social  History   Tobacco Use  . Smoking status: Never Smoker  . Smokeless tobacco: Never Used  Vaping Use  . Vaping Use: Never used  Substance Use Topics  . Alcohol use: No    Alcohol/week: 0.0 standard drinks    Comment: rare  . Drug use: No    Review of Systems Constitutional: No fever/chills Eyes: No visual changes. ENT: Nosebleed primarily from the left side, some from the right. Cardiovascular: Denies chest pain. Respiratory: Denies shortness of breath. Gastrointestinal: No abdominal pain.  No nausea, no vomiting.  No diarrhea.  No constipation. Genitourinary: Negative for dysuria. Musculoskeletal: Negative for neck pain.  Negative for back pain. Integumentary: Negative for rash. Neurological: Negative for headaches, focal weakness or numbness.   ____________________________________________   PHYSICAL EXAM:  VITAL SIGNS: ED Triage Vitals  Enc Vitals Group     BP 08/15/20 2309 (!) 138/91     Pulse Rate 08/15/20 2309 87     Resp 08/15/20 2309 18     Temp 08/15/20 2309 99.1 F (37.3 C)     Temp Source 08/15/20 2309 Oral     SpO2 08/15/20 2309 100 %     Weight 08/15/20 2307 118.4 kg (261 lb)     Height 08/15/20 2307 1.727 m ( )     Head Circumference --      Peak Flow --      Pain Score 08/15/20 2307 5     Pain Loc --      Pain Edu? --      Excl. in GC? --     Constitutional: Alert and oriented.  Eyes: Conjunctivae are normal.  Head: Atraumatic. Nose: Mild active bleeding from the left naris.  No visible abnormality.  No obvious epistaxis from the right naris. Mouth/Throat: Large blood clot in the back of the throat near the uvula, no active bleeding. Neck: No stridor.  No meningeal signs.   Cardiovascular: Normal rate, regular rhythm. Good peripheral circulation. Respiratory: Normal respiratory effort.  No retractions. Gastrointestinal: Soft and nontender. No distention.  Musculoskeletal: No lower extremity tenderness nor edema. No gross deformities of  extremities. Neurologic:  Normal speech and language. No gross focal neurologic deficits are appreciated.  Skin:  Skin is warm, dry and intact.   ____________________________________________   LABS (all labs ordered are listed, but only abnormal results are displayed)  Labs Reviewed  CBC WITH DIFFERENTIAL/PLATELET - Abnormal; Notable for the following components:      Result Value   WBC 16.7 (*)    Hemoglobin 11.6 (*)    RDW 15.9 (*)  Neutro Abs 13.9 (*)    Abs Immature Granulocytes 0.11 (*)    All other components within normal limits  BASIC METABOLIC PANEL - Abnormal; Notable for the following components:   Glucose, Bld 188 (*)    BUN 31 (*)    Calcium 8.6 (*)    All other components within normal limits  CBC - Abnormal; Notable for the following components:   WBC 15.4 (*)    RBC 3.77 (*)    Hemoglobin 11.3 (*)    HCT 34.5 (*)    RDW 16.0 (*)    All other components within normal limits  RESP PANEL BY RT-PCR (FLU A&B, COVID) ARPGX2  PROTIME-INR  APTT  BASIC METABOLIC PANEL  HEMOGLOBIN AND HEMATOCRIT, BLOOD  HEMOGLOBIN AND HEMATOCRIT, BLOOD  HEMOGLOBIN AND HEMATOCRIT, BLOOD  HEMOGLOBIN A1C   ____________________________________________  EKG  ED ECG REPORT I, Loleta Rose, the attending physician, personally viewed and interpreted this ECG.  Date: 08/16/2020 EKG Time: 3 AM Rate: 101 Rhythm: Borderline sinus tachycardia QRS Axis: normal Intervals: Left ventricular hypertrophy ST/T Wave abnormalities: Non-specific ST segment / T-wave changes, but no clear evidence of acute ischemia. Narrative Interpretation: no definitive evidence of acute ischemia; does not meet STEMI criteria.   ____________________________________________  RADIOLOGY I, Loleta Rose, personally viewed and evaluated these images (plain radiographs) as part of my medical decision making, as well as reviewing the written report by the radiologist.  ED MD interpretation: No indication for  emergent imaging  Official radiology report(s): No results found.  ____________________________________________   PROCEDURES   Procedure(s) performed (including Critical Care):  .Epistaxis Management  Date/Time: 08/16/2020 1:00 AM Performed by: Loleta Rose, MD Authorized by: Loleta Rose, MD   Consent:    Consent obtained:  Verbal   Risks, benefits, and alternatives were discussed: yes     Risks discussed:  Bleeding, infection, nasal injury and pain   Alternatives discussed:  Alternative treatment Universal protocol:    Patient identity confirmed:  Verbally with patient Anesthesia:    Anesthesia method:  Topical application   Topical anesthetic:  Lidocaine gel Procedure details:    Treatment site:  L posterior and L anterior   Treatment method:  Merocel sponge   Treatment episode: recurring   Post-procedure details:    Assessment:  Bleeding decreased   Procedure completion:  Tolerated with difficulty Comments:     Wrapped Merocel with Surgicel, applied small amount of bacitracin ointment, and used small amount of viscous lidocaine for lubrication.     ____________________________________________   INITIAL IMPRESSION / MDM / ASSESSMENT AND PLAN / ED COURSE  As part of my medical decision making, I reviewed the following data within the electronic MEDICAL RECORD NUMBER History obtained from family, Nursing notes reviewed and incorporated, Labs reviewed , Old chart reviewed, Discussed with admitting physician (Dr. Arville Care), Discussed with ENT (Dr. Willeen Cass) and reviewed Notes from prior ED visits   Differential diagnosis includes, but is not limited to, nonspecific nosebleed, hematoma, surgical complication.  Patient has no systemic signs or symptoms.  Nosebleed is occurring in the setting of 1 week postop and resumption of Eliquis.  She was off of Eliquis for a few days but was bridged with Lovenox.  We discussed various treatment options (adult son is at bedside as well)  and we are going to try approach it in a stepwise pattern as indicated in the clinical course.     Clinical Course as of 08/16/20 0536  Wed Aug 15, 2020  2325 Applied Afrin  and nasal clip [CF]  2358 Removed nasal clip.  No obvious or active bleeding from the nose at this time.  Patient has a clot visible in the back of her throat that is bothering her but otherwise is stable.  We are going to wait 20 minutes or so and see if the bleeding resumes.  Patient understands the plan.  She is tolerating sips of water to try to clear the blood in the back of her throat. [CF]  Thu Aug 16, 2020  0017 Patient bleeding again.  Talked by phone with Dr. Willeen Cass who confirmed the next step is packing, preferably Merocel but Rapid Rhino is appropriate as well.  [CF]  0140 (Delayed documentation due to multiple active patients in the ED.) I placed a 10 cm Merocel sponge which I first wrapped with Surgicel, a small amount of bacitracin ointment, and lubricated with viscous lidocaine.  She tolerated relatively well.  She had a very small amount of bleeding from the right naris but it was self-limited.  She continued to be most bothered by the large clot on the back of her throat.  This was causing her to become quite agitated and she had a little bit of additional bleeding from the left naris around the Merocel which was relatively mild.  I used cetacaine spray and removed a large clot from the back of her throat (behind the uvula) with ring forceps and she felt much better.  I am currently letting her sit and rest to see if she is going to be able to be discharged for outpatient follow-up.  [CF]  0220 Unfortunately patient continues to be very uncomfortable and is leaking a small amount of blood which is continue to form clots in the back of her throat.  I am worried that she continues to bleed in spite of multiple measures taken to stop the epistaxis.  She does not need emergent ENT intervention but I believe she would  benefit from observation in the hospital overnight and ENT consult in the morning.  She is not comfortable going home but agrees to this plan.  I do not think it would be advisable to remove the Merocel and try different method of control.  Better to trend H&H and to be available for suctioning and consult in the morning.  Patient agrees with the plan.  I will check basic labs and then consult the hospitalist for admission. [CF]  0242 I was called into the room because the patient is again choking.  She has another blood clot forming behind her uvula which suggest to me she is continue to bleed even a small amount.  I again tried to remove it after using numbing spray and using ring forceps but this time I am not able to adequately grasp it and I told her I need to stop trying for fear of causing some oropharyngeal trauma.  She understands.  She is quite anxious.  Once an IV is placed and I am going to give her 1 mg of Versed to help as a calming agent.  I am getting an EKG to check and see if she is in A. fib.  We are continue with the plan for admission. [CF]  0254 CBC with Differential/Platelet(!) Unclear clinical significance of leukocytosis of 16.7.  I am treating with prophylactic antibiotics for the nasal packing regardless.  I am giving 1 dose of Augmentin. [CF]  0317 Hemoglobin is relatively reassuring at 11.6.  However it is notable that this  is 2-1/2 points down from her hemoglobin 10 days ago.  She might of lost some blood during her surgery, but this also likely represents acute blood loss from her epistaxis tonight, particularly because I got the hemoglobin after her blood loss and not immediately upon arrival.  It is very appropriate to continue to trend her H&H in the hospital. [CF]  5643 Basic metabolic panel(!) Stable BMP [CF]  0325 Spoke by phone with Dr. Arville Care with the hospitalist service.  He will admit. [CF]  [id number] The patient is admitted but I was told by the ED nurse that she was in  increasing distress with the sensation of a foreign body in the back of her throat.  I was able to visualize another very large clot in the back of her throat just behind into the right side of her uvula.  I anesthetized her again with viscous lidocaine and with some cetocaine spray.  I was then able to retrieve a blood clot roughly the size of the palm of the patient's hand using ring forceps.  There was no trauma to the uvula or the posterior pharynx and the patient tolerated it well and felt much better afterwards.  I explained that this suggests to me that she is continue to bleed slowly, but I reiterated that removing the Merocel would likely cause more trauma, bleeding, swelling, and discomfort, then it would be helpful because then we would just need to try something else, like a Rapid Rhino.  Patient is much more comfortable now and agrees with the plan.  Reportedly the hospitalist, Dr. Arville Care, has been in communication with Dr. Willeen Cass with ENT who confirmed the prior recommendation and my recommendation that this requires evaluation by Dr. Elenore Rota later today but does not require emergent surgery. [CF]    Clinical Course User Index [CF] Loleta Rose, MD     ____________________________________________  FINAL CLINICAL IMPRESSION(S) / ED DIAGNOSES  Final diagnoses:  Left-sided epistaxis  Current use of long term anticoagulation  Acute blood loss anemia     MEDICATIONS GIVEN DURING THIS VISIT:  Medications  bacitracin ointment ( Topical Given 08/16/20 0028)  HYDROcodone-acetaminophen (NORCO/VICODIN) 5-325 MG per tablet 1-2 tablet (has no administration in time range)  amLODipine (NORVASC) tablet 5 mg (has no administration in time range)  metoprolol tartrate (LOPRESSOR) tablet 100 mg (has no administration in time range)  irbesartan (AVAPRO) tablet 37.5 mg (has no administration in time range)  ramelteon (ROZEREM) tablet 8 mg (has no administration in time range)  venlafaxine XR  (EFFEXOR-XR) 24 hr capsule 150 mg (has no administration in time range)  Semaglutide (1 MG/DOSE) SOPN 1 mg (has no administration in time range)  famotidine (PEPCID) tablet 20 mg (has no administration in time range)  meclizine (ANTIVERT) tablet 12.5 mg (has no administration in time range)  Lifitegrast 5 % SOLN 1 drop (has no administration in time range)  0.9 %  sodium chloride infusion ( Intravenous New Bag/Given 08/16/20 0505)  acetaminophen (TYLENOL) tablet 650 mg (has no administration in time range)    Or  acetaminophen (TYLENOL) suppository 650 mg (has no administration in time range)  traZODone (DESYREL) tablet 25 mg (25 mg Oral Given 08/16/20 0505)  magnesium hydroxide (MILK OF MAGNESIA) suspension 30 mL (has no administration in time range)  ondansetron (ZOFRAN) tablet 4 mg (has no administration in time range)    Or  ondansetron (ZOFRAN) injection 4 mg (has no administration in time range)  LORazepam (ATIVAN) injection 0.5 mg (has no  administration in time range)  labetalol (NORMODYNE) injection 20 mg (has no administration in time range)  insulin aspart (novoLOG) injection 0-9 Units (has no administration in time range)  oxymetazoline (AFRIN) 0.05 % nasal spray 1 spray (1 spray Each Nare Given 08/15/20 2321)  tranexamic acid (CYKLOKAPRON) 1000 MG/10ML topical solution 500 mg (500 mg Topical Given by Other 08/16/20 0130)  lidocaine (XYLOCAINE) 2 % viscous mouth solution 15 mL (15 mLs Mouth/Throat Given 08/16/20 0028)  butamben-tetracaine-benzocaine (CETACAINE) spray 1 spray (1 spray Topical Given 08/16/20 0130)  midazolam (VERSED) injection 1 mg (1 mg Intravenous Given 08/16/20 0250)  amoxicillin-clavulanate (AUGMENTIN) 875-125 MG per tablet 1 tablet (1 tablet Oral Given 08/16/20 0305)  lidocaine (XYLOCAINE) 2 % viscous mouth solution 15 mL (15 mLs Mouth/Throat Given by Other 08/16/20 0524)     ED Discharge Orders    None      *Please note:  Regina Christensen was evaluated in  Emergency Department on 08/16/2020 for the symptoms described in the history of present illness. She was evaluated in the context of the global COVID-19 pandemic, which necessitated consideration that the patient might be at risk for infection with the SARS-CoV-2 virus that causes COVID-19. Institutional protocols and algorithms that pertain to the evaluation of patients at risk for COVID-19 are in a state of rapid change based on information released by regulatory bodies including the CDC and federal and state organizations. These policies and algorithms were followed during the patient's care in the ED.  Some ED evaluations and interventions may be delayed as a result of limited staffing during and after the pandemic.*  Note:  This document was prepared using Dragon voice recognition software and may include unintentional dictation errors.   Loleta RoseForbach, Kijuan Gallicchio, MD 08/16/20 [id number]    Loleta RoseForbach, Vuk Skillern, MD 08/16/20 [id number]

## 2020-08-15 NOTE — ED Notes (Signed)
ED Provider at bedside. 

## 2020-08-15 NOTE — ED Notes (Addendum)
Pt presenting to ED with nose bleed since around 9pm. Pt reports she noticed some clots at home. Recent sinus surgery. On eliquis. Pt endorses mild headache but denies other pain. ENT cart at bedside. AAOx4 VSS

## 2020-08-15 NOTE — ED Triage Notes (Addendum)
Pt arrived via POV with reports of nosebleed that started around 9pm. Pt states she is passing large clots, pt had recent sinus surgery 1 week ago and does take eliquis  Nosebleed from L nostril. Pt c/o nausea and HA at this time.

## 2020-08-15 NOTE — ED Notes (Signed)
TXA vial placed at bedside per MD request

## 2020-08-16 ENCOUNTER — Encounter: Payer: Self-pay | Admitting: Family Medicine

## 2020-08-16 DIAGNOSIS — I10 Essential (primary) hypertension: Secondary | ICD-10-CM | POA: Diagnosis not present

## 2020-08-16 DIAGNOSIS — I480 Paroxysmal atrial fibrillation: Secondary | ICD-10-CM

## 2020-08-16 DIAGNOSIS — S42215A Unspecified nondisplaced fracture of surgical neck of left humerus, initial encounter for closed fracture: Secondary | ICD-10-CM | POA: Diagnosis present

## 2020-08-16 DIAGNOSIS — R040 Epistaxis: Secondary | ICD-10-CM | POA: Diagnosis not present

## 2020-08-16 DIAGNOSIS — Z7901 Long term (current) use of anticoagulants: Secondary | ICD-10-CM

## 2020-08-16 DIAGNOSIS — F419 Anxiety disorder, unspecified: Secondary | ICD-10-CM

## 2020-08-16 DIAGNOSIS — E119 Type 2 diabetes mellitus without complications: Secondary | ICD-10-CM

## 2020-08-16 DIAGNOSIS — E1129 Type 2 diabetes mellitus with other diabetic kidney complication: Secondary | ICD-10-CM

## 2020-08-16 DIAGNOSIS — D62 Acute posthemorrhagic anemia: Secondary | ICD-10-CM

## 2020-08-16 DIAGNOSIS — K219 Gastro-esophageal reflux disease without esophagitis: Secondary | ICD-10-CM

## 2020-08-16 DIAGNOSIS — F32A Depression, unspecified: Secondary | ICD-10-CM

## 2020-08-16 LAB — BASIC METABOLIC PANEL
Anion gap: 6 (ref 5–15)
Anion gap: 9 (ref 5–15)
BUN: 31 mg/dL — ABNORMAL HIGH (ref 8–23)
BUN: 34 mg/dL — ABNORMAL HIGH (ref 8–23)
CO2: 24 mmol/L (ref 22–32)
CO2: 23 mmol/L (ref 22–32)
Calcium: 8.6 mg/dL — ABNORMAL LOW (ref 8.9–10.3)
Calcium: 8.5 mg/dL — ABNORMAL LOW (ref 8.9–10.3)
Chloride: 108 mmol/L (ref 98–111)
Chloride: 110 mmol/L (ref 98–111)
Creatinine, Ser: 0.67 mg/dL (ref 0.44–1.00)
Creatinine, Ser: 0.6 mg/dL (ref 0.44–1.00)
GFR, Estimated: >60 mL/min (ref >60)
GFR, Estimated: >60 mL/min (ref >60)
Glucose, Bld: 188 mg/dL — ABNORMAL HIGH (ref 70–99)
Glucose, Bld: 158 mg/dL — ABNORMAL HIGH (ref 70–99)
Potassium: 4 mmol/L (ref 3.5–5.1)
Potassium: 3.7 mmol/L (ref 3.5–5.1)
Sodium: 138 mmol/L (ref 135–145)
Sodium: 142 mmol/L (ref 135–145)

## 2020-08-16 LAB — CBC WITH DIFFERENTIAL/PLATELET
Abs Immature Granulocytes: 0.11 10*3/uL — ABNORMAL HIGH (ref 0.00–0.07)
Basophils Absolute: 0.1 10*3/uL (ref 0.0–0.1)
Basophils Relative: 0 %
Eosinophils Absolute: 0.1 10*3/uL (ref 0.0–0.5)
Eosinophils Relative: 1 %
HCT: 36 % (ref 36.0–46.0)
Hemoglobin: 11.6 g/dL — ABNORMAL LOW (ref 12.0–15.0)
Immature Granulocytes: 1 %
Lymphocytes Relative: 11 %
Lymphs Abs: 1.8 10*3/uL (ref 0.7–4.0)
MCH: 29.7 pg (ref 26.0–34.0)
MCHC: 32.2 g/dL (ref 30.0–36.0)
MCV: 92.1 fL (ref 80.0–100.0)
Monocytes Absolute: 0.7 10*3/uL (ref 0.1–1.0)
Monocytes Relative: 4 %
Neutro Abs: 13.9 10*3/uL — ABNORMAL HIGH (ref 1.7–7.7)
Neutrophils Relative %: 83 %
Platelets: 320 10*3/uL (ref 150–400)
RBC: 3.91 MIL/uL (ref 3.87–5.11)
RDW: 15.9 % — ABNORMAL HIGH (ref 11.5–15.5)
WBC: 16.7 10*3/uL — ABNORMAL HIGH (ref 4.0–10.5)
nRBC: 0 % (ref 0.0–0.2)

## 2020-08-16 LAB — CBC
HCT: 34.5 % — ABNORMAL LOW (ref 36.0–46.0)
Hemoglobin: 11.3 g/dL — ABNORMAL LOW (ref 12.0–15.0)
MCH: 30 pg (ref 26.0–34.0)
MCHC: 32.8 g/dL (ref 30.0–36.0)
MCV: 91.5 fL (ref 80.0–100.0)
Platelets: 330 10*3/uL (ref 150–400)
RBC: 3.77 MIL/uL — ABNORMAL LOW (ref 3.87–5.11)
RDW: 16 % — ABNORMAL HIGH (ref 11.5–15.5)
WBC: 15.4 10*3/uL — ABNORMAL HIGH (ref 4.0–10.5)
nRBC: 0 % (ref 0.0–0.2)

## 2020-08-16 LAB — RESP PANEL BY RT-PCR (FLU A&B, COVID) ARPGX2
Influenza A by PCR: NEGATIVE
Influenza B by PCR: NEGATIVE
SARS Coronavirus 2 by RT PCR: NEGATIVE

## 2020-08-16 LAB — GLUCOSE, CAPILLARY
Glucose-Capillary: 135 mg/dL — ABNORMAL HIGH (ref 70–99)
Glucose-Capillary: 146 mg/dL — ABNORMAL HIGH (ref 70–99)
Glucose-Capillary: 168 mg/dL — ABNORMAL HIGH (ref 70–99)

## 2020-08-16 LAB — PROTIME-INR
INR: 1.1 (ref 0.8–1.2)
Prothrombin Time: 14.1 seconds (ref 11.4–15.2)

## 2020-08-16 LAB — HEMOGLOBIN AND HEMATOCRIT, BLOOD
HCT: 30.8 % — ABNORMAL LOW (ref 36.0–46.0)
HCT: 28.4 % — ABNORMAL LOW (ref 36.0–46.0)
HCT: 27.5 % — ABNORMAL LOW (ref 36.0–46.0)
Hemoglobin: 9.7 g/dL — ABNORMAL LOW (ref 12.0–15.0)
Hemoglobin: 9.2 g/dL — ABNORMAL LOW (ref 12.0–15.0)
Hemoglobin: 8.9 g/dL — ABNORMAL LOW (ref 12.0–15.0)

## 2020-08-16 LAB — HEMOGLOBIN A1C
Hgb A1c MFr Bld: 6 % — ABNORMAL HIGH (ref 4.8–5.6)
Mean Plasma Glucose: 125.5 mg/dL

## 2020-08-16 LAB — APTT: aPTT: 36 seconds (ref 24–36)

## 2020-08-16 MED ORDER — ONDANSETRON HCL 4 MG/2ML IJ SOLN
4.0000 mg | Freq: Four times a day (QID) | INTRAMUSCULAR | Status: DC | PRN
Start: 2020-08-16 — End: 2020-08-20

## 2020-08-16 MED ORDER — ONDANSETRON HCL 4 MG PO TABS: 4.0000 mg | ORAL_TABLET | Freq: Four times a day (QID) | ORAL | Status: DC | PRN

## 2020-08-16 MED ORDER — BUTAMBEN-TETRACAINE-BENZOCAINE 2-2-14 % EX AERO
1.0000 | INHALATION_SPRAY | Freq: Once | CUTANEOUS | Status: AC
  Administered 2020-08-16: 1 via TOPICAL

## 2020-08-16 MED ORDER — RAMELTEON 8 MG PO TABS
8.0000 mg | ORAL_TABLET | Freq: Every day | ORAL | Status: DC
  Administered 2020-08-16 – 2020-08-18 (×3): 8 mg via ORAL
  Filled 2020-08-16 (×5): qty 1

## 2020-08-16 MED ORDER — SEMAGLUTIDE (1 MG/DOSE) 2 MG/1.5ML ~~LOC~~ SOPN: 1.0000 mg | PEN_INJECTOR | SUBCUTANEOUS | Status: DC

## 2020-08-16 MED ORDER — TRAZODONE HCL 50 MG PO TABS
25.0000 mg | ORAL_TABLET | Freq: Every evening | ORAL | Status: DC | PRN
  Administered 2020-08-16: 25 mg via ORAL
  Filled 2020-08-16: qty 1

## 2020-08-16 MED ORDER — VENLAFAXINE HCL ER 150 MG PO CP24
150.0000 mg | ORAL_CAPSULE | Freq: Every day | ORAL | Status: DC
  Administered 2020-08-16 – 2020-08-20 (×5): 150 mg via ORAL
  Filled 2020-08-16 (×3): qty 1
  Filled 2020-08-16: qty 2
  Filled 2020-08-16: qty 1
  Filled 2020-08-16 (×4): qty 2
  Filled 2020-08-16: qty 1

## 2020-08-16 MED ORDER — MECLIZINE HCL 12.5 MG PO TABS
12.5000 mg | ORAL_TABLET | Freq: Three times a day (TID) | ORAL | Status: DC | PRN
  Filled 2020-08-16: qty 1

## 2020-08-16 MED ORDER — METOPROLOL TARTRATE 50 MG PO TABS
100.0000 mg | ORAL_TABLET | Freq: Two times a day (BID) | ORAL | Status: DC
  Administered 2020-08-16 (×2): 100 mg via ORAL
  Filled 2020-08-16 (×3): qty 2

## 2020-08-16 MED ORDER — LIFITEGRAST 5 % OP SOLN
1.0000 [drp] | Freq: Two times a day (BID) | OPHTHALMIC | Status: DC
  Administered 2020-08-20: 1 [drp] via OPHTHALMIC

## 2020-08-16 MED ORDER — ACETAMINOPHEN 325 MG PO TABS
650.0000 mg | ORAL_TABLET | Freq: Four times a day (QID) | ORAL | Status: DC | PRN
  Administered 2020-08-19 – 2020-08-20 (×2): 650 mg via ORAL
  Filled 2020-08-16 (×2): qty 2

## 2020-08-16 MED ORDER — FLUCONAZOLE 50 MG PO TABS
150.0000 mg | ORAL_TABLET | Freq: Once | ORAL | Status: AC
  Administered 2020-08-16: 150 mg via ORAL
  Filled 2020-08-16: qty 1

## 2020-08-16 MED ORDER — FAMOTIDINE 20 MG PO TABS
20.0000 mg | ORAL_TABLET | Freq: Every day | ORAL | Status: DC
  Administered 2020-08-16 – 2020-08-20 (×5): 20 mg via ORAL
  Filled 2020-08-16 (×5): qty 1

## 2020-08-16 MED ORDER — AMLODIPINE BESYLATE 5 MG PO TABS
5.0000 mg | ORAL_TABLET | Freq: Every day | ORAL | Status: DC
  Administered 2020-08-16 – 2020-08-17 (×2): 5 mg via ORAL
  Filled 2020-08-16 (×2): qty 1

## 2020-08-16 MED ORDER — SODIUM CHLORIDE 0.9 % IV SOLN: INTRAVENOUS | Status: DC

## 2020-08-16 MED ORDER — ACETAMINOPHEN 650 MG RE SUPP: 650.0000 mg | Freq: Four times a day (QID) | RECTAL | Status: DC | PRN

## 2020-08-16 MED ORDER — LIDOCAINE VISCOUS HCL 2 % MT SOLN
15.0000 mL | Freq: Once | OROMUCOSAL | Status: AC
  Administered 2020-08-16: 15 mL via OROMUCOSAL
  Filled 2020-08-16: qty 15

## 2020-08-16 MED ORDER — INSULIN ASPART 100 UNIT/ML ~~LOC~~ SOLN
0.0000 [IU] | Freq: Three times a day (TID) | SUBCUTANEOUS | Status: DC
  Administered 2020-08-16 (×2): 1 [IU] via SUBCUTANEOUS
  Administered 2020-08-16: 2 [IU] via SUBCUTANEOUS
  Filled 2020-08-16 (×4): qty 1

## 2020-08-16 MED ORDER — LABETALOL HCL 5 MG/ML IV SOLN: 20.0000 mg | INTRAVENOUS | Status: DC | PRN

## 2020-08-16 MED ORDER — HYDROCODONE-ACETAMINOPHEN 5-325 MG PO TABS: 1.0000 | ORAL_TABLET | Freq: Four times a day (QID) | ORAL | Status: DC | PRN

## 2020-08-16 MED ORDER — AMOXICILLIN-POT CLAVULANATE 875-125 MG PO TABS
1.0000 | ORAL_TABLET | Freq: Two times a day (BID) | ORAL | Status: DC
  Administered 2020-08-16 – 2020-08-20 (×9): 1 via ORAL
  Filled 2020-08-16 (×9): qty 1

## 2020-08-16 MED ORDER — LORAZEPAM 2 MG/ML IJ SOLN: 0.5000 mg | INTRAMUSCULAR | Status: DC | PRN

## 2020-08-16 MED ORDER — AMOXICILLIN-POT CLAVULANATE 875-125 MG PO TABS
1.0000 | ORAL_TABLET | Freq: Once | ORAL | Status: AC
  Administered 2020-08-16: 1 via ORAL
  Filled 2020-08-16: qty 1

## 2020-08-16 MED ORDER — MIDAZOLAM HCL 2 MG/2ML IJ SOLN
1.0000 mg | Freq: Once | INTRAMUSCULAR | Status: AC
  Administered 2020-08-16: 1 mg via INTRAVENOUS
  Filled 2020-08-16: qty 2

## 2020-08-16 MED ORDER — CLOTRIMAZOLE 1 % VA CREA
1.0000 | TOPICAL_CREAM | Freq: Every day | VAGINAL | Status: DC
  Administered 2020-08-16: 1 via VAGINAL
  Filled 2020-08-16: qty 45

## 2020-08-16 MED ORDER — BACITRACIN ZINC 500 UNIT/GM EX OINT
TOPICAL_OINTMENT | Freq: Two times a day (BID) | CUTANEOUS | Status: DC
  Filled 2020-08-16 (×2): qty 0.9

## 2020-08-16 MED ORDER — MAGNESIUM HYDROXIDE 400 MG/5ML PO SUSP: 30.0000 mL | Freq: Every day | ORAL | Status: DC | PRN

## 2020-08-16 MED ORDER — PANTOPRAZOLE SODIUM 40 MG IV SOLR
40.0000 mg | Freq: Two times a day (BID) | INTRAVENOUS | Status: DC
  Administered 2020-08-16 – 2020-08-19 (×7): 40 mg via INTRAVENOUS
  Filled 2020-08-16 (×7): qty 40

## 2020-08-16 MED ORDER — IRBESARTAN 75 MG PO TABS
37.5000 mg | ORAL_TABLET | Freq: Every day | ORAL | Status: DC
  Administered 2020-08-16: 37.5 mg via ORAL
  Filled 2020-08-16 (×3): qty 0.5

## 2020-08-16 NOTE — Progress Notes (Signed)
PROGRESS NOTE    Regina Christensen  IDP:[id number] DOB: 11-Feb-1956 DOA: 08/15/2020 PCP: [name], MD (Confirm with patient/family/NH records and if not entered, this HAS to be entered at [facility] point of entry. "No PCP" if truly none.)   Chief Complaint  Patient presents with  . Epistaxis    Brief Narrative: (Start on day 1 of progress note - keep it brief and live) Patient is a pleasant 65 year old female history of chronic diastolic CHF, paroxysmal atrial fibrillation on chronic anticoagulation with Eliquis, hypertension, depression, sleep apnea, history of CVA as well as recent sinus surgery per Dr. Elenore Rota with bilateral endoscopic maxillary antrostomies, right endoscopic total ethmoidectomy with frontal sinusotomy and left endoscopic partial ethmoidectomy as well as endoscopic treatment of both concha bullosa of the middle turbinates (08/08/2020) who presents to the ED with acute sudden onset persistent left-sided epistaxis with no recent trauma ongoing for couple of hours.  Patient seen in the ED given oral Augmentin, bacitracin ointment, Cetacaine and Xylocaine, milligram of IV Versed Afrin nasal spray and Tranxene make acid 500 mg topically with some nasal packing.  Due to ongoing epistaxis and coughing up of blood Dr. York Cerise ED was able to pull out a clot from patient's throat.  Patient admitted for observation.  Patient coughing up more blood this morning with some blood clots.  ENT notified.   Assessment & Plan:   Active Problems:   Hypertension   Epistaxis   Unspecified nondisplaced fracture of surgical neck of left humerus, initial encounter for closed fracture   Acute blood loss anemia   Current use of long term anticoagulation   Left-sided epistaxis   Type 2 diabetes mellitus without complication (HCC)   AF (paroxysmal atrial fibrillation) (HCC)   Gastroesophageal reflux disease   Depression  #1 persistent left-sided epistaxis with subsequent acute blood loss  anemia -Patient presented persistent left epistaxis after recent sinus surgery in the setting of oral anticoagulation of Eliquis. -Patient status post clot removal by ED physician, Dr. York Cerise this morning with picture as noted above. -Patient coughing up blood clots this morning per RN.  Nasal packing soaked with blood. -Patient feeling has coughed up most of the clot. -Continue serial H&H with transfusion threshold hemoglobin < 8. -Type and screen. -Nasal packing in place. -Afrin nasal spray as needed. -Continue to hold Eliquis until cleared to resume by ENT. -Continue oral Augmentin. -ENT notified this morning of patient's coughing up of clots and Dr. Elenore Rota to assess patient this morning. -We will make patient n.p.o. until assessed by ENT.  2.  Anxiety -Continue Ativan as needed.  Trazodone nightly  3.  Hypertension -Continue current regimen of Norvasc, Lopressor, ARB.  4.  Diabetes mellitus type 2 -Hemoglobin A1c 6.2 (03/01/2020). -Repeat Hemoglobin A1c. -Blood glucose of 158 this morning on lab work. -Sliding scale insulin.  5.  Gastroesophageal reflux disease -Pepcid.  6.  Paroxysmal atrial fibrillation -Lopressor for rate control.  Anticoagulation on hold secondary to problem #1.  ENT to advise when anticoagulation may be resumed.  7.  Depression Effexor.   DVT prophylaxis: SCDs Code Status: Full Family Communication: Updated patient and son at bedside. Disposition:   Status is: Observation    Dispo: The patient is from: Home              Anticipated d/c is to: Home              Patient currently with epistaxis, coughing/vomiting up blood clots.  Not stable for discharge  Difficult to place patient no       Consultants:   ENT pending  Procedures:   Clot removal from throat by EDMD Dr. York Cerise.  08/16/2020  Antimicrobials:   Augmentin 08/16/2020 >>>>>   Subjective: Laying in bed.  Stated coughed up some bloody clots this morning.  Had a  feeling of some clots in her back of her throat that she feels is not as large as what was removed early on this morning.  No chest pain.  No shortness of breath.  Drank some ginger ale this morning.  Objective: Vitals:   08/16/20 0530 08/16/20 0549 08/16/20 0600 08/16/20 0655  BP:  112/70 116/69 115/83  Pulse: 96 99 86 95  Resp: 12 (!) 23 (!) 22 19  Temp:    97.8 F (36.6 C)  TempSrc:    Oral  SpO2: 100% 98% 96% 98%  Weight:      Height:       No intake or output data in the 24 hours ending 08/16/20 0952 Filed Weights   08/15/20 2307  Weight: 118.4 kg    Examination:  General exam: Nasal packing soaked with blood. HEENT: Normocephalic/atraumatic.  Oropharynx with some dried blood in the posterior pharynx.  No lesions. Respiratory system: Clear to auscultation bilaterally.  No wheezes, no crackles, no rhonchi. Respiratory effort normal. Cardiovascular system: S1 & S2 heard, RRR. No JVD, murmurs, rubs, gallops or clicks. No pedal edema. Gastrointestinal system: Abdomen is nondistended, soft and nontender. No organomegaly or masses felt. Normal bowel sounds heard. Central nervous system: Alert and oriented. No focal neurological deficits. Extremities: Symmetric 5 x 5 power. Skin: No rashes, lesions or ulcers Psychiatry: Judgement and insight appear normal. Mood & affect appropriate.       Data Reviewed: I have personally reviewed following labs and imaging studies  CBC: Recent Labs  Lab 08/16/20 0241 08/16/20 0453  WBC 16.7* 15.4*  NEUTROABS 13.9*  --   HGB 11.6* 11.3*  HCT 36.0 34.5*  MCV 92.1 91.5  PLT 320 330    Basic Metabolic Panel: Recent Labs  Lab 08/16/20 0241 08/16/20 0453  NA 138 142  K 4.0 3.7  CL 108 110  CO2 24 23  GLUCOSE 188* 158*  BUN 31* 34*  CREATININE 0.67 0.60  CALCIUM 8.6* 8.5*    GFR: Estimated Creatinine Clearance: 96.1 mL/min (by C-G formula based on SCr of 0.6 mg/dL).  Liver Function Tests: No results for input(s): AST,  ALT, ALKPHOS, BILITOT, PROT, ALBUMIN in the last 168 hours.  CBG: No results for input(s): GLUCAP in the last 168 hours.   Recent Results (from the past 240 hour(s))  Resp Panel by RT-PCR (Flu A&B, Covid) Nasopharyngeal Swab     Status: None   Collection Time: 08/16/20  2:41 AM   Specimen: Nasopharyngeal Swab; Nasopharyngeal(NP) swabs in vial transport medium  Result Value Ref Range Status   SARS Coronavirus 2 by RT PCR NEGATIVE NEGATIVE Final    Comment: (NOTE) SARS-CoV-2 target nucleic acids are NOT DETECTED.  The SARS-CoV-2 RNA is generally detectable in upper respiratory specimens during the acute phase of infection. The lowest concentration of SARS-CoV-2 viral copies this assay can detect is 138 copies/mL. A negative result does not preclude SARS-Cov-2 infection and should not be used as the sole basis for treatment or other patient management decisions. A negative result may occur with  improper specimen collection/handling, submission of specimen other than nasopharyngeal swab, presence of viral mutation(s) within the areas targeted by this  assay, and inadequate number of viral copies(<138 copies/mL). A negative result must be combined with clinical observations, patient history, and epidemiological information. The expected result is Negative.  Fact Sheet for Patients:  BloggerCourse.com  Fact Sheet for Healthcare Providers:  SeriousBroker.it  This test is no t yet approved or cleared by the [place] FDA and  has been authorized for detection and/or diagnosis of SARS-CoV-2 by FDA under an Emergency Use Authorization (EUA). This EUA will remain  in effect (meaning this test can be used) for the duration of the COVID-19 declaration under Section 564(b)(1) of the Act, 21 U.S.C.section 360bbb-3(b)(1), unless the authorization is terminated  or revoked sooner.       Influenza A by PCR NEGATIVE NEGATIVE Final    Influenza B by PCR NEGATIVE NEGATIVE Final    Comment: (NOTE) The Xpert Xpress SARS-CoV-2/FLU/RSV plus assay is intended as an aid in the diagnosis of influenza from Nasopharyngeal swab specimens and should not be used as a sole basis for treatment. Nasal washings and aspirates are unacceptable for Xpert Xpress SARS-CoV-2/FLU/RSV testing.  Fact Sheet for Patients: BloggerCourse.com  Fact Sheet for Healthcare Providers: SeriousBroker.it  This test is not yet approved or cleared by the [place] FDA and has been authorized for detection and/or diagnosis of SARS-CoV-2 by FDA under an Emergency Use Authorization (EUA). This EUA will remain in effect (meaning this test can be used) for the duration of the COVID-19 declaration under Section 564(b)(1) of the Act, 21 U.S.C. section 360bbb-3(b)(1), unless the authorization is terminated or revoked.  Performed at [facility], [address] [place], [place] [address]          Radiology Studies: No results found.      Scheduled Meds: . amLODipine  5 mg Oral Daily  . amoxicillin-clavulanate  1 tablet Oral Q12H  . bacitracin   Topical BID  . famotidine  20 mg Oral Daily  . insulin aspart  0-9 Units Subcutaneous TID PC & HS  . irbesartan  37.5 mg Oral Daily  . Lifitegrast  1 drop Both Eyes BID  . metoprolol tartrate  100 mg Oral BID  . ramelteon  8 mg Oral QHS  . venlafaxine XR  150 mg Oral Q breakfast   Continuous Infusions: . sodium chloride 100 mL/hr at 08/16/20 0505     LOS: 1 day    Time spent: 40 minutes  No charge    Ramiro Harvest, MD [facility]   To contact the attending provider between 7A-7P or the covering provider during after hours 7P-7A, please log into the web site [email or web address] and access using universal [place] password for that web site. If you do not have the password, please call the hospital operator.  08/16/2020,  9:52 AM

## 2020-08-16 NOTE — ED Notes (Signed)
Pt reports she feels another clot forming in the back of her throat, MD made aware  Pt provided ginger ale with provider approval

## 2020-08-16 NOTE — Progress Notes (Signed)
    S: Asked by internal medicine to review chart and evaluate options for management of stroke risk in the setting of prior h/o PAF w/ recent epistaxis following sinus surgery 4/20.  In the setting of prior CVA when noncompliant w/ eliquis, and CHA2DS2VASc = 6, pt was bridged w/ lovenox for 2 days prior to surgery.  She resumed eliquis 4/21.  She developed epistaxis on 4/27, prompting ED eval this AM, where she was found to have significant bleeding from the L nostril.  This was packed.  This AM, she vomited up a clot presumed to be 2/2 bleeding down the back of her throat.  Pt has been seen by ENT w/ recommendation to continue to hold eliquis.  Pt is very tired currently, dozing off while talking w/ her and family, but has no current complaints.  O:   Vitals:   08/16/20 1215 08/16/20 1606  BP: 120/72 128/76  Pulse: 97 (!) 103  Resp: 14 18  Temp: 97.6 F (36.4 C) 97.7 F (36.5 C)  SpO2: 97% 94%  Pleasant, NAD, tired but AAOx3. Lungs diminished breath sounds bilat. Cor RRR.  Tele RSR, Sinus tachycardia.  Brief, 14 beat run of SVT.  Lab Results  Component Value Date   WBC 15.4 (H) 08/16/2020   HGB 9.7 (L) 08/16/2020   HCT 30.8 (L) 08/16/2020   MCV 91.5 08/16/2020   PLT 330 08/16/2020   Lab Results  Component Value Date   CREATININE 0.60 08/16/2020   BUN 34 (H) 08/16/2020   NA 142 08/16/2020   K 3.7 08/16/2020   CL 110 08/16/2020   CO2 23 08/16/2020    A/P: 1.  Epistaxis:  Pt admitted this AM w/ epistaxis in the setting of recent nasal surgery 4/20, and chronic eliquis therapy.  Bleeding currently controlled w/ packing.  H/H down slightly @ 9.7/30.8.  Eliquis on hold.  From cardiology standpoint, recurrent bleeding risk far outweighs stroke risk at this time, thus ok to hold eliquis as long as deemed necessary by ENT team.  As this is her first bleed and as it followed recent instrumentation, we would not consider her a candidate for [facility] @ this time, but could consider in the  future if recurrent bleeding following resumption of eliquis remains an issue.  2. PAF: maintaining sinus rhythm.  Cont  blocker.  3. Essential HTN:  Stable.  Nicolasa Ducking, NP

## 2020-08-16 NOTE — H&P (Addendum)
[place]   PATIENT NAME: Regina Christensen    MR#:  [id number]  DATE OF BIRTH:  01/12/1956  DATE OF ADMISSION:  08/15/2020  PRIMARY CARE PHYSICIAN: [name], MD   Patient is coming from: Home  REQUESTING/REFERRING PHYSICIAN: Loleta Rose, MD  CHIEF COMPLAINT:   Chief Complaint  Patient presents with  . Epistaxis    HISTORY OF PRESENT ILLNESS:  Regina Christensen is a 65 y.o. female with medical history significant for chronic diastolic CHF, paroxysmal atrial fibrillation on Eliquis, hypertension, depression, sleep apnea and CVA as well as recent sinus surgery by Dr. Elenore Rota with bilateral endoscopic maxillary antrostomies, right endoscopic total ethmoidectomy with frontal sinusotomy and left endoscopic partial ethmoidectomy as well as endoscopic trimming of both concha bullosa of the middle turbinates.  She presents to emergency room with acute onset of persistent left-sided epistaxis and acute onset with no recent trauma.  This has been going on for couple of hours before she came to the ER.  She had no Afrin at home and applying pressure did not help.  No fever or chills.  She vomited twice in the ER.  No other bleeding diathesis.  No chest pain or dyspnea or cough or wheezing or hemoptysis.  No dysuria, oliguria or hematuria or flank pain.  She denies any presyncope or syncope.  ED Course: Blood pressure was 138/91 when she came with otherwise normal vital signs.  Labs revealed a blood glucose of 188 with otherwise normal BMP.  CBC showed leukocytosis of 16.7 with neutrophilia and hemoglobin was 11.6 and hematocrit 36 compared to 14 and 42.1 on 08/06/2020.  Influenza antigens and COVID-19 PCR came back negative. EKG as reviewed by me : Showed sinus tachycardia with rate of 101 with left ventricular hypertrophy and T wave inversion inferiorly.  Patient was given p.o. Augmentin, bacitracin ointment, Cetacaine and Xylocaine, 1 mg of IV Versed, Afrin nasal spray and  tranexamic acid 500 mg topically.  Contact was made with Dr. Willeen Cass who recommended keeping her nasal pack for 5 to 7 days and follow-up on an outpatient basis with Dr.  Elenore Rota.  The patient continued to have leaking despite medications given below and therefore she will be admitted to an observation medical monitored bed for further evaluation and management. PAST MEDICAL HISTORY:   Past Medical History:  Diagnosis Date  . Anxiety   . Arthritis   . Back pain   . BPPV (benign paroxysmal positional vertigo)   . Chronic diastolic HF (heart failure) (HCC)   . Complication of anesthesia    Post-operative hypoxia requiring supplemental oxygen  . Current use of long term anticoagulation    Apixaban  . CVA (cerebral vascular accident) (HCC) 02/28/2020   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; imaging from [facility]  . Depression   . Edema of both lower extremities   . GERD (gastroesophageal reflux disease)   . Hypertension   . IBS (irritable bowel syndrome)   . Lactose intolerance   . Morbid obesity with BMI of 45.0-49.9, adult (HCC)   . Obesity   . Osteoarthritis   . PAF (paroxysmal atrial fibrillation) (HCC)   . Pre-diabetes   . Sleep apnea    uses CPAP machine sometimes   . Vitamin D deficiency     PAST SURGICAL HISTORY:   Past Surgical History:  Procedure Laterality Date  . ABDOMINAL HYSTERECTOMY    . BREAST EXCISIONAL BIOPSY Left   . ENDOMETRIAL ABLATION  x2  . ENDOSCOPIC CONCHA BULLOSA RESECTION Bilateral 08/08/2020   Procedure: ENDOSCOPIC CONCHA BULLOSA RESECTION;  Surgeon: Vernie Murders, MD;  Location: [facility] ORS;  Service: ENT;  Laterality: Bilateral;  . ETHMOIDECTOMY Right 08/08/2020   Procedure: ETHMOIDECTOMY, LEFT PARTIAL ETHMOIDECTOMY;  Surgeon: Vernie Murders, MD;  Location: [facility] ORS;  Service: ENT;  Laterality: Right;  . IMAGE GUIDED SINUS SURGERY N/A 08/08/2020   Procedure: IMAGE GUIDED SINUS SURGERY, RIGHT FRONTAL SINUSOTOMY;  Surgeon: Vernie Murders, MD;  Location:  [facility] ORS;  Service: ENT;  Laterality: N/A;  . LAPAROSCOPIC SLEEVE GASTRECTOMY    . MAXILLARY ANTROSTOMY Bilateral 08/08/2020   Procedure: MAXILLARY ANTROSTOMY with tissue;  Surgeon: Vernie Murders, MD;  Location: [facility] ORS;  Service: ENT;  Laterality: Bilateral;  . TOTAL KNEE ARTHROPLASTY Right     SOCIAL HISTORY:   Social History   Tobacco Use  . Smoking status: Never Smoker  . Smokeless tobacco: Never Used  Substance Use Topics  . Alcohol use: No    Alcohol/week: 0.0 standard drinks    Comment: rare    FAMILY HISTORY:   Family History  Problem Relation Age of Onset  . Diabetes Mother   . High blood pressure Mother   . Heart disease Father   . Cancer Brother   . Mental illness Neg Hx     DRUG ALLERGIES:   Allergies  Allergen Reactions  . Ace Inhibitors Cough  . Hctz [Hydrochlorothiazide] Rash    face    REVIEW OF SYSTEMS:   ROS As per history of present illness. All pertinent systems were reviewed above. Constitutional, HEENT, cardiovascular, respiratory, GI, GU, musculoskeletal, neuro, psychiatric, endocrine, integumentary and hematologic systems were reviewed and are otherwise negative/unremarkable except for positive findings mentioned above in the HPI.   MEDICATIONS AT HOME:   Prior to Admission medications   Medication Sig Start Date End Date Taking? Authorizing Provider  acetaminophen (TYLENOL) 650 MG CR tablet Take 1,300 mg by mouth every 8 (eight) hours as needed for pain.   Yes [provider]  amLODipine (NORVASC) 5 MG tablet Take 1 tablet (5 mg total) by mouth daily. 07/30/20  Yes Sowles, Danna Hefty, MD  apixaban (ELIQUIS) 5 MG TABS tablet Take 1 tablet (5 mg total) by mouth 2 (two) times daily. 04/09/20  Yes Debbe Odea, MD  famotidine (PEPCID) 20 MG tablet Take 1 tablet (20 mg total) by mouth daily. 06/06/20  Yes Sowles, Danna Hefty, MD  meclizine (ANTIVERT) 12.5 MG tablet Take 1 tablet (12.5 mg total) by mouth 3 (three) times daily as needed  for dizziness. 06/06/20  Yes Sowles, Danna Hefty, MD  metoprolol tartrate (LOPRESSOR) 100 MG tablet Take 1 tablet (100 mg total) by mouth 2 (two) times daily. 09/09/19  Yes Agbor-Etang, Arlys John, MD  olmesartan (BENICAR) 40 MG tablet TAKE 1 TABLET BY MOUTH EVERY DAY 06/28/20  Yes Sowles, Danna Hefty, MD  ramelteon (ROZEREM) 8 MG tablet Take 1 tablet (8 mg total) by mouth at bedtime. 04/24/20  Yes Eappen, Levin Bacon, MD  Semaglutide, 1 MG/DOSE, (OZEMPIC, 1 MG/DOSE,) 2 MG/1.5ML SOPN Inject 1 mg into the skin once a week. Sunday 03/05/20  Yes Sowles, Danna Hefty, MD  triamcinolone (NASACORT) 55 MCG/ACT AERO nasal inhaler Place 2 sprays into the nose daily.   Yes [provider]  venlafaxine XR (EFFEXOR-XR) 150 MG 24 hr capsule TAKE 1 CAPSULE DAILY WITH BREAKFAST 07/26/20  Yes Eappen, Levin Bacon, MD  Vitamin D, Ergocalciferol, (DRISDOL) 1.25 MG (50000 UNIT) CAPS capsule Take 1 capsule (50,000 Units total) by mouth every 7 (seven)  days. Patient taking differently: Take 50,000 Units by mouth every 7 (seven) days. (Sundays) 07/17/20  Yes Danford, Jinny Blossom, NP  XIIDRA 5 % SOLN Place 1 drop into both eyes 2 (two) times daily. 02/03/17  Yes [provider]  BIOTIN MAXIMUM PO Take by mouth. Patient not taking: Reported on 08/16/2020    [provider]  cephALEXin (KEFLEX) 500 MG capsule Take 1 capsule (500 mg total) by mouth 2 (two) times daily. Patient not taking: Reported on 08/16/2020 08/08/20   Vernie Murders, MD  enoxaparin (LOVENOX) 120 MG/0.8ML injection Inject 0.8 mLs (120 mg total) into the skin every 12 (twelve) hours. Patient not taking: Reported on 08/16/2020 08/01/20   Debbe Odea, MD  Multiple Vitamins-Minerals (MULTIVITAMIN WITH MINERALS) tablet Take 1 tablet by mouth daily. Patient not taking: Reported on 08/16/2020    [provider]  predniSONE (DELTASONE) 10 MG tablet Start with 3 pills tomorrow. Taper over the next 6 days.  3,3,2,2,1,1. Patient not taking: Reported on 08/16/2020  08/08/20   Vernie Murders, MD      VITAL SIGNS:  Blood pressure (!) 139/94, pulse 93, temperature 99.1 F (37.3 C), temperature source Oral, resp. rate (!) 22, height 5\' 8"  (1.727 m), weight 118.4 kg, SpO2 99 %.  PHYSICAL EXAMINATION:  Physical Exam  GENERAL:  65 y.o.-year-old African-American female patient lying in the bed with no acute distress.  EYES: Pupils equal, round, reactive to light and accommodation. No scleral icterus. Extraocular muscles intact.  HEENT: Head atraumatic, normocephalic. Oropharynx with trickling blood and 2 large blood clots that were removed by Dr. 77 (1 of which shown below) and nasopharynx with left nasal anterior packing.  NECK:  Supple, no jugular venous distention. No thyroid enlargement, no tenderness.  LUNGS: Normal breath sounds bilaterally, no wheezing, rales,rhonchi or crepitation. No use of accessory muscles of respiration.  CARDIOVASCULAR: Regular rate and rhythm, S1, S2 normal. No murmurs, rubs, or gallops.  ABDOMEN: Soft, nondistended, nontender. Bowel sounds present. No organomegaly or mass.  EXTREMITIES: No pedal edema, cyanosis, or clubbing.  NEUROLOGIC: Cranial nerves II through XII are intact. Muscle strength 5/5 in all extremities. Sensation intact. Gait not checked.  PSYCHIATRIC: The patient is alert and oriented x 3.  Normal affect and good eye contact. SKIN: No obvious rash, lesion, or ulcer.     LABORATORY PANEL:   CBC Recent Labs  Lab 08/16/20 0241  WBC 16.7*  HGB 11.6*  HCT 36.0  PLT 320   ------------------------------------------------------------------------------------------------------------------  Chemistries  Recent Labs  Lab 08/16/20 0241  NA 138  K 4.0  CL 108  CO2 24  GLUCOSE 188*  BUN 31*  CREATININE 0.67  CALCIUM 8.6*   ------------------------------------------------------------------------------------------------------------------  Cardiac Enzymes No results for input(s): TROPONINI in the  last 168 hours. ------------------------------------------------------------------------------------------------------------------  RADIOLOGY:  No results found.    IMPRESSION AND PLAN:  Active Problems:   Epistaxis   Unspecified nondisplaced fracture of surgical neck of left humerus, initial encounter for closed fracture  1.  Persistent left-sided epistaxis with subsequent mild acute blood loss anemia. - The patient was admitted to a in observation medical monitored bed. - We will follow serial hemoglobins and hematocrits. - We will keep her nasal back. - ENT consult was obtained as mentioned above and the recommendation was for keeping her pack and follow-up on an outpatient basis. - We will utilize Afrin nasal spray as needed. - Her Eliquis will obviously be held off. - We will continue p.o. Augmentin prophylactically.  2.  Anxiety.  That should be placed on as needed Ativan and for insomnia.  Trazodone.  3.  Essential hypertension. - We will continue her amlodipine, Lopressor and ARB.  4.  Type 2 diabetes mellitus. - The patient will be placed on supplement coverage with NovoLog and we will continue her basal coverage.  5.  Depression. - We will continue Effexor XR  DVT prophylaxis:  SCDs.  Medical prophylaxis currently contraindicated due to her epistaxis. Code Status: full code. Family Communication:  The plan of care was discussed in details with the patient (and family). I answered all questions. The patient agreed to proceed with the above mentioned plan. Further management will depend upon hospital course. Disposition Plan: Back to previous home environment Consults called: ENT consult was obtained and recommendation was for follow-up on an outpatient basis.  If she has further bleeding inpatient consult can be obtained after discussion with Dr. Elenore RotaJuengel. All the records are reviewed and case discussed with ED provider.  Status is: Observation  The patient remains  OBS appropriate and will d/c before 2 midnights.  Dispo: The patient is from: Home              Anticipated d/c is to: Home              Patient currently is not medically stable to d/c.   Difficult to place patient No      TOTAL TIME TAKING CARE OF THIS PATIENT: 55 minutes.    Regina Christensen M.D on 08/16/2020 at 4:46 AM  [facility]   From 7 PM-7 AM, contact night-coverage [email or web address]  CC: Primary care physician; Alba CorySowles, Krichna, MD

## 2020-08-16 NOTE — Consult Note (Signed)
Regina Christensen, Regina Christensen [id number] 06-16-1955 Regina Bong, MD   Reason for Consult: Evaluate for uncontrolled epistaxis  HPI: Patient is a 65 year old African-American female who is now 8 days postop endoscopic sinus surgery for opening up her maxillary and ethmoid sinuses trimming her concha bullosa of the middle turbinates.  She had creamy white mucopus that was filling both maxillary sinuses that needed to be drained.  She was off her Eliquis for 5 days prior to her surgery with a Lovenox bridge.  She started her Eliquis the day after surgery as she was not have any bleeding.  She was seen in the office 2 days ago for evaluation nose and some dried mucus was suctioned from her middle meatus on both sides.  She was not having any active bleeding at all in the first 6 days postop.  Last night she started with bleeding and was seen in the emergency room.  She had significant blood coming from her left nostril.  A Merocel sponge pack was placed two thirds of the way back into the nose.  She was admitted to the hospital.  Her blood pressure was elevated some because of pain and anxiety.  She had further bleeding, down the back of her throat and some clots as well.  This morning she vomited up clot and cleared out her stomach some.  She is seen for reevaluation of this deciding what the most appropriate management for her.  Allergies:  Allergies  Allergen Reactions  . Ace Inhibitors Cough  . Hctz [Hydrochlorothiazide] Rash    face    ROS: Review of systems normal other than 12 systems except per HPI.  PMH:  Past Medical History:  Diagnosis Date  . Anxiety   . Arthritis   . Back pain   . BPPV (benign paroxysmal positional vertigo)   . Chronic diastolic HF (heart failure) (HCC)   . Complication of anesthesia    Post-operative hypoxia requiring supplemental oxygen  . Current use of long term anticoagulation    Apixaban  . CVA (cerebral vascular accident) (HCC) 02/28/2020   7 x 3 mm posterior  frontal lobe infarct; imaging from [facility]  . Depression   . Edema of both lower extremities   . GERD (gastroesophageal reflux disease)   . Hypertension   . IBS (irritable bowel syndrome)   . Lactose intolerance   . Morbid obesity with BMI of 45.0-49.9, adult (HCC)   . Obesity   . Osteoarthritis   . PAF (paroxysmal atrial fibrillation) (HCC)   . Pre-diabetes   . Sleep apnea    uses CPAP machine sometimes   . Type 2 diabetes mellitus without complication (HCC)   . Vitamin D deficiency     FH:  Family History  Problem Relation Age of Onset  . Diabetes Mother   . High blood pressure Mother   . Heart disease Father   . Cancer Brother   . Mental illness Neg Hx     SH:  Social History   Socioeconomic History  . Marital status: Divorced    Spouse name: Not on file  . Number of children: 2  . Years of education: Not on file  . Highest education level: Associate degree: occupational, Scientist, product/process development, or vocational program  Occupational History  . Occupation: Retired/ work PT    Employer: [employer]  Tobacco Use  . Smoking status: Never Smoker  . Smokeless tobacco: Never Used  Vaping Use  . Vaping Use: Never used  Substance and Sexual Activity  .  Alcohol use: No    Alcohol/week: 0.0 standard drinks    Comment: rare  . Drug use: No  . Sexual activity: Not on file  Other Topics Concern  . 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 ABDOMINAL HYSTERECTOMY    . BREAST EXCISIONAL BIOPSY Left   . ENDOMETRIAL ABLATION      x2  . ENDOSCOPIC CONCHA BULLOSA RESECTION Bilateral 08/08/2020   Procedure: ENDOSCOPIC CONCHA BULLOSA RESECTION;  Surgeon: Vernie Murders, MD;  Location: [facility] ORS;  Service: ENT;  Laterality: Bilateral;  . ETHMOIDECTOMY Right 08/08/2020   Procedure: ETHMOIDECTOMY, LEFT PARTIAL ETHMOIDECTOMY;  Surgeon: Vernie Murders, MD;  Location: [facility] ORS;  Service: ENT;  Laterality: Right;  . IMAGE GUIDED SINUS SURGERY N/A 08/08/2020   Procedure: IMAGE GUIDED SINUS SURGERY, RIGHT FRONTAL SINUSOTOMY;  Surgeon: Vernie Murders, MD;  Location: [facility] ORS;  Service: ENT;  Laterality: N/A;  . LAPAROSCOPIC SLEEVE GASTRECTOMY    . MAXILLARY ANTROSTOMY Bilateral 08/08/2020   Procedure: MAXILLARY ANTROSTOMY with tissue;  Surgeon: Vernie Murders, MD;  Location: [facility] ORS;  Service: ENT;  Laterality: Bilateral;  . TOTAL KNEE ARTHROPLASTY Right     Physical  Exam: Patient's vital signs are normal with blood pressure not elevated currently.  She is resting in bed with her feet elevated and her head lightly elevated.  She has a Merocel sponge that sticking out 1-1/2 inches out the nose.  This is completely bloodsoaked but relatively dry.  The sponges cut off at the level of the nose so its not hanging out there.  It does not appear to be oozing at all around the front of the nose.  Her right nostril appears open and clear without any clotting.  Her oropharynx shows no sign of bruising or swelling.  Her posterior pharynx has some dried blood on the posterior pharyngeal wall but no evidence of any active bleeding or drainage coming down the back of her throat.  Her neck is negative for nodes or masses or tenderness.  Resting hemoglobin is then in the high 13's to 14.  In the ER last night she was down to 11.3 and this morning after some hydration was 9.7.  We will continue to watch this and make sure that it starts to come back up again.  If she has further rapid bleeding she may need some blood products acutely.   A/P: The patient is 8  days postop and has been on Eliquis for blood thinning secondary to atrial fib.  The Eliquis was held last night.  It seems as though her bleeding has temporarily stopped, and if we can keep her at rest or her blood pressure stays low and she is not anxious that this may clot well and keep the bleeding under control while it heals.  I would like to hold on the anticoagulants for another day at least but will ask cardiology to evaluate her as to whether there is other options  to do that may be not as likely to increase bleeding issues.  We will put pressure on her lower legs to help prevent clotting here.  If she can rest and does not have any further bleeding then we will plan to leave the packing in as it is and consider removing it on Monday and clean things out in her nose again.  If she has further bleeding while she is here and at rest, then we will either have to replace her packing in the room or take her to the operating room to try to control it where we can visualize the area much better. She will continue to need hydration and we can give her some clear liquids for now as she seems as though she stopped bleeding.  I would rather her not do any chewing for the time being that would put pressure on her nose and sinuses.  I discussed this with the patient and family at the bedside at length.  Dr. Andee Poles was in the room with me as well discussing with the patient also.  He will be covering call this evening for any emergencies that may come up.   Beverly Sessions Alsace Dowd 08/16/2020 12:09 PM

## 2020-08-16 NOTE — ED Notes (Addendum)
Pt called this RN to room, reports she feels very scared d/t her symptoms. States versed did not work and she is still anxious. Mansy MD messaged regarding symptoms

## 2020-08-16 NOTE — ED Notes (Addendum)
This RN at beside to perform admission orders and give PRN trazodone per [facility] MD. Pt states she feels worse than when she first got here. Son expresses concern that not enough things are being done to help pt and that ENT has not seen pt yet. Pt's son informed that we are doing everything ordered to help her. Pt and son informed that labs are being drawn to recheck her blood levels, specifically hemoglobin and hematocrit since she has been bleeding.   York Cerise MD asked to go into room to reassess pt and to reassess if clot in throat is able to be pulled out since that is what is bothering pt the most. Viscous lidocaine given to York Cerise MD.

## 2020-08-16 NOTE — ED Notes (Signed)
Clip removed by MD

## 2020-08-16 NOTE — ED Notes (Signed)
Pt anxious d/t nose bleed, 1mg  versed given per MD order

## 2020-08-16 NOTE — ED Notes (Signed)
Pt coughed and began bleeding again. Clip placed back on per provider request. ENT being consulted

## 2020-08-16 NOTE — ED Notes (Signed)
York Cerise MD able to pull out clot from pt's throat. Mansy MD now at bedside.

## 2020-08-16 NOTE — ED Notes (Signed)
Forbach MD at bedside. 

## 2020-08-16 NOTE — ED Notes (Signed)
Pt reports feeling much better and feels like she is able to breathe better now that clot has been removed. States she feels more comfortable. Pt provided blanket per request. She denies further needs.  Pt's daughter, Victorino Dike, updated on plan of care

## 2020-08-16 NOTE — ED Notes (Signed)
York Cerise MD at bedside to pack pt's nose

## 2020-08-16 NOTE — Progress Notes (Signed)
Patient arrived to unit during morning shift change. Patient had complaints of stomach pain accompanied by nose bleed packed in ED on left nares. Patient vitals checked by this Rn and WDL. Patient assisted to bathroom for bowel movement by this RN. Patient began to feel week and diaphoretic on toilet. Large stool occurrence with blood noted in toilet as well. Patient also had large amount of bloody emesis with clots while in toilet. MD Janee Morn notified via page about patient's status. This RN was able to contact ENT MDs to come to bedside and assess patient.  No interventions were done at bedside by this RN. ENT MD trimmed nasal packing. Patient was assisted to bed by staff where she remained waiting for MD to come to bedside.  Patient has been on bedrest throughout the day per MD to maintain control over BP. Vitals have been checked and WDL. Patient given bath and placed on bedpan due to stomach pains. No bowel movement at this time. Patient resting comfortably in bed at this time with family at bedside.

## 2020-08-17 DIAGNOSIS — D62 Acute posthemorrhagic anemia: Secondary | ICD-10-CM | POA: Diagnosis not present

## 2020-08-17 DIAGNOSIS — R040 Epistaxis: Secondary | ICD-10-CM | POA: Diagnosis not present

## 2020-08-17 DIAGNOSIS — Z7901 Long term (current) use of anticoagulants: Secondary | ICD-10-CM | POA: Diagnosis not present

## 2020-08-17 DIAGNOSIS — I959 Hypotension, unspecified: Secondary | ICD-10-CM | POA: Diagnosis not present

## 2020-08-17 DIAGNOSIS — D649 Anemia, unspecified: Secondary | ICD-10-CM

## 2020-08-17 DIAGNOSIS — I951 Orthostatic hypotension: Secondary | ICD-10-CM | POA: Diagnosis not present

## 2020-08-17 DIAGNOSIS — I480 Paroxysmal atrial fibrillation: Secondary | ICD-10-CM | POA: Diagnosis not present

## 2020-08-17 LAB — HEMOGLOBIN AND HEMATOCRIT, BLOOD
HCT: 26.2 % — ABNORMAL LOW (ref 36.0–46.0)
Hemoglobin: 8.6 g/dL — ABNORMAL LOW (ref 12.0–15.0)

## 2020-08-17 LAB — CBC WITH DIFFERENTIAL/PLATELET
Abs Immature Granulocytes: 0.15 10*3/uL — ABNORMAL HIGH (ref 0.00–0.07)
Basophils Absolute: 0.1 10*3/uL (ref 0.0–0.1)
Basophils Relative: 1 %
Eosinophils Absolute: 0 10*3/uL (ref 0.0–0.5)
Eosinophils Relative: 0 %
HCT: 25.4 % — ABNORMAL LOW (ref 36.0–46.0)
Hemoglobin: 8.3 g/dL — ABNORMAL LOW (ref 12.0–15.0)
Immature Granulocytes: 1 %
Lymphocytes Relative: 24 %
Lymphs Abs: 4.1 10*3/uL — ABNORMAL HIGH (ref 0.7–4.0)
MCH: 30 pg (ref 26.0–34.0)
MCHC: 32.7 g/dL (ref 30.0–36.0)
MCV: 91.7 fL (ref 80.0–100.0)
Monocytes Absolute: 1.1 10*3/uL — ABNORMAL HIGH (ref 0.1–1.0)
Monocytes Relative: 7 %
Neutro Abs: 11.6 10*3/uL — ABNORMAL HIGH (ref 1.7–7.7)
Neutrophils Relative %: 67 %
Platelets: 273 10*3/uL (ref 150–400)
RBC: 2.77 MIL/uL — ABNORMAL LOW (ref 3.87–5.11)
RDW: 16 % — ABNORMAL HIGH (ref 11.5–15.5)
Smear Review: NORMAL
WBC: 17.1 10*3/uL — ABNORMAL HIGH (ref 4.0–10.5)
nRBC: 0 % (ref 0.0–0.2)

## 2020-08-17 LAB — BASIC METABOLIC PANEL
Anion gap: 7 (ref 5–15)
BUN: 35 mg/dL — ABNORMAL HIGH (ref 8–23)
CO2: 25 mmol/L (ref 22–32)
Calcium: 8.3 mg/dL — ABNORMAL LOW (ref 8.9–10.3)
Chloride: 109 mmol/L (ref 98–111)
Creatinine, Ser: 0.82 mg/dL (ref 0.44–1.00)
GFR, Estimated: >60 mL/min (ref >60)
Glucose, Bld: 111 mg/dL — ABNORMAL HIGH (ref 70–99)
Potassium: 3.5 mmol/L (ref 3.5–5.1)
Sodium: 141 mmol/L (ref 135–145)

## 2020-08-17 LAB — IRON AND TIBC
Iron: 54 ug/dL (ref 28–170)
Saturation Ratios: 25 % (ref 10.4–31.8)
TIBC: 216 ug/dL — ABNORMAL LOW (ref 250–450)
UIBC: 162 ug/dL

## 2020-08-17 LAB — RETICULOCYTES
Immature Retic Fract: 31.4 % — ABNORMAL HIGH (ref 2.3–15.9)
RBC.: 2.37 MIL/uL — ABNORMAL LOW (ref 3.87–5.11)
Retic Count, Absolute: 78.4 10*3/uL (ref 19.0–186.0)
Retic Ct Pct: 3.3 % — ABNORMAL HIGH (ref 0.4–3.1)

## 2020-08-17 LAB — GLUCOSE, CAPILLARY
Glucose-Capillary: 120 mg/dL — ABNORMAL HIGH (ref 70–99)
Glucose-Capillary: 94 mg/dL (ref 70–99)
Glucose-Capillary: 103 mg/dL — ABNORMAL HIGH (ref 70–99)
Glucose-Capillary: 80 mg/dL (ref 70–99)

## 2020-08-17 LAB — ABO/RH: ABO/RH(D): B POS

## 2020-08-17 LAB — FOLATE: Folate: 8.9 ng/mL (ref >5.9)

## 2020-08-17 LAB — MAGNESIUM: Magnesium: 2.2 mg/dL (ref 1.7–2.4)

## 2020-08-17 LAB — PREPARE RBC (CROSSMATCH)

## 2020-08-17 LAB — FERRITIN: Ferritin: 41 ng/mL (ref 11–307)

## 2020-08-17 LAB — VITAMIN B12: Vitamin B-12: 405 pg/mL (ref 180–914)

## 2020-08-17 MED ORDER — POTASSIUM CHLORIDE CRYS ER 20 MEQ PO TBCR
40.0000 meq | EXTENDED_RELEASE_TABLET | Freq: Once | ORAL | Status: AC
  Administered 2020-08-17: 40 meq via ORAL
  Filled 2020-08-17: qty 2

## 2020-08-17 MED ORDER — SODIUM CHLORIDE 0.9 % IV BOLUS
1000.0000 mL | Freq: Once | INTRAVENOUS | Status: AC
  Administered 2020-08-17: 1000 mL via INTRAVENOUS

## 2020-08-17 MED ORDER — DIPHENHYDRAMINE HCL 25 MG PO CAPS
25.0000 mg | ORAL_CAPSULE | Freq: Once | ORAL | Status: AC
Start: 2020-08-17 — End: 2020-08-17
  Administered 2020-08-17: 25 mg via ORAL
  Filled 2020-08-17: qty 1

## 2020-08-17 MED ORDER — SODIUM CHLORIDE 0.9% IV SOLUTION: Freq: Once | INTRAVENOUS | Status: AC

## 2020-08-17 MED ORDER — ACETAMINOPHEN 325 MG PO TABS
650.0000 mg | ORAL_TABLET | Freq: Once | ORAL | Status: AC
  Administered 2020-08-17: 650 mg via ORAL
  Filled 2020-08-17: qty 2

## 2020-08-17 NOTE — Progress Notes (Addendum)
PROGRESS NOTE    Regina Christensen  VFI:[id number] DOB: 11/20/1955 DOA: 08/15/2020 PCP: [name], MD    Chief Complaint  Patient presents with  . Epistaxis    Brief Narrative:  Patient is a pleasant 65 year old female history of chronic diastolic CHF, paroxysmal atrial fibrillation on chronic anticoagulation with Eliquis, hypertension, depression, sleep apnea, history of CVA as well as recent sinus surgery per Dr. Elenore Rota with bilateral endoscopic maxillary antrostomies, right endoscopic total ethmoidectomy with frontal sinusotomy and left endoscopic partial ethmoidectomy as well as endoscopic treatment of both concha bullosa of the middle turbinates (08/08/2020) who presents to the ED with acute sudden onset persistent left-sided epistaxis with no recent trauma ongoing for couple of hours.  Patient seen in the ED given oral Augmentin, bacitracin ointment, Cetacaine and Xylocaine, milligram of IV Versed Afrin nasal spray and Tranxene make acid 500 mg topically with some nasal packing.  Due to ongoing epistaxis and coughing up of blood Dr. York Cerise ED was able to pull out a clot from patient's throat.  Patient admitted for observation.  Patient coughing up more blood this morning with some blood clots.  ENT notified.   Assessment & Plan:   Active Problems:   Hypotension   Hypertension   Epistaxis   Unspecified nondisplaced fracture of surgical neck of left humerus, initial encounter for closed fracture   Acute blood loss anemia   Current use of long term anticoagulation   Left-sided epistaxis   Type 2 diabetes mellitus without complication (HCC)   AF (paroxysmal atrial fibrillation) (HCC)   Gastroesophageal reflux disease   Depression   Orthostasis  1 persistent left-sided epistaxis with subsequent acute blood loss anemia/symptomatic anemia -Patient presented persistent left epistaxis after recent sinus surgery in the setting of oral anticoagulation of Eliquis. -Patient status  post clot removal by ED physician, Dr. York Cerise the morning of 08/16/2020, with picture as noted below. -Patient noted to have no further coughing of blood clots this morning.  Stated had some bloody bowel movements yesterday with none today.   -Nasal packing in place soaked with dried blood.   -Patient typed and screened.   -Afrin nasal spray as needed.   -Continue to hold Eliquis until cleared by ENT for resumption  -Continue Augmentin.   -Patient seen in consultation by ENT Dr. Elenore Rota who feels bleeding may have slowed down and recommending to continue nasal packing and as a days with consideration of removal on Monday and to clean things out of her nose again at that time, if further bleeding while here may have to replace packing in the room or take to the OR to try to control or visualize bleeding site.   -Patient's hemoglobin currently at 8.3 from 11.6 on admission. -Check an anemia panel. -Patient with hypotension, and symptomatic and as such we will transfuse a unit packed red blood cells.   -Continue clear liquids per ENT recommendations. -Follow H&H  -Per ENT.  2.  Hypotension/orthostasis -Secondary to problem #1 -Patient with complaints of lightheadedness and dizziness when sitting up. -Systolic blood pressure in the 80s this morning. -Hemoglobin currently at 8.3 from 11.6 on admission. -Discontinue antihypertensive medications. -Transfuse 1 unit packed red blood cells. -IV fluids. -Serial H&H. -Supportive care.  3.  Anxiety -Continue Ativan as needed.  Trazodone nightly  4.  Hypertension -Patient now hypotensive with systolic blood pressures in the 80s with complaints of dizziness and as such we will discontinue antihypertensive medications.  Patient noted to have already received Norvasc this morning.   -  Patient to be transfused 1 unit packed red blood cells.   -Place on hydration.   5.  Well-controlled diabetes mellitus type 2 -Hemoglobin A1c 6.2  (03/01/2020). -Repeat hemoglobin A1c at 6.0 (08/16/2020 ). -CBG 120 this morning. -Continue sliding scale insulin.  6.  Gastroesophageal reflux disease -Continue Pepcid  7.  Paroxysmal atrial fibrillation -Lopressor as patient with systolic blood pressures in the 80s.   -Continue to hold anticoagulation secondary to problem #1.   -ENT to advise when anticoagulation may be resumed.   -Cardiology is the patient and I agree with holding anticoagulation and feel recurrent bleeding risk far outweighs stroke risk at this time and okay to hold Eliquis as long as deemed necessary per ENT.   -Appreciate cardiology input and recommendations.  8.  Depression -Stable.   -Continue Effexor.  9.  Vaginal candidiasis -Status post Diflucan 150 mg p.o. x1. -Clotrimazole cream nightly.   DVT prophylaxis: SCDs Code Status: Full Family Communication: Updated patient and son and sister at bedside. Disposition:   Status is: Observation    Dispo: The patient is from: Home              Anticipated d/c is to: Home              Patient currently with epistaxis, coughing/vomiting up blood clots.  Patient orthostatic with acute blood loss anemia.  Not stable for discharge   Difficult to place patient no       Consultants:   ENT: Dr. Elenore RotaJuengel 08/16/2020  Cardiology: Dr. Mariah MillingGollan 08/16/2020  Procedures:   Clot removal from throat by EDMD Dr. York CeriseForbach.  08/16/2020  Transfuse 1 unit packed red blood cells pending 08/17/2020  Antimicrobials:   Augmentin 08/16/2020 >>>>>   Subjective: Sleeping but arousable.  Stated had some coughing up blood yesterday and dark maroon-colored stools yesterday.  No further bleeding today.   Patient with complaints of lightheadedness when she sits up.  Noted to have systolic blood pressures in the 80s this morning.  No chest pain.  No shortness of breath  Objective: Vitals:   08/17/20 1243 08/17/20 1317 08/17/20 1457 08/17/20 1650  BP: (!) 97/50 98/61 109/64 (!)  87/56  Pulse: 93 87 83 78  Resp: 16 14 16 18   Temp: (!) 97.4 F (36.3 C) 98.2 F (36.8 C) 97.8 F (36.6 C)   TempSrc: Oral Oral Oral   SpO2: 98% 98% 100%   Weight:      Height:        Intake/Output Summary (Last 24 hours) at 08/17/2020 1735 Last data filed at 08/17/2020 1600 Gross per 24 hour  Intake 1150 ml  Output 401 ml  Net 749 ml   Filed Weights   08/15/20 2307 08/17/20 0500  Weight: 118.4 kg 120.6 kg    Examination:  General exam: Nasal packing soaked with blood and dried. HEENT: [place]/AT.  Oropharynx with no fresh blood noted.  No lesions.  Respiratory system: CTA B.  No wheezes, no crackles, no rhonchi.  Normal respiratory effort. Cardiovascular system: RRR no murmurs rubs or gallops.  No JVD.  No lower extremity edema.  S1 & S2 heard, RRR. No JVD, murmurs, rubs, gallops or clicks. No pedal edema. Gastrointestinal system: Abdomen is soft, nontender, nondistended, positive bowel sounds.  No rebound.  No guarding.  Central nervous system: Alert and oriented. No focal neurological deficits. Extremities: Symmetric 5 x 5 power. Skin: No rashes, lesions or ulcers Psychiatry: Judgement and insight appear normal. Mood & affect appropriate.  Data Reviewed: I have personally reviewed following labs and imaging studies  CBC: Recent Labs  Lab 08/16/20 0241 08/16/20 0453 08/16/20 1023 08/16/20 1651 08/16/20 2205 08/17/20 0424  WBC 16.7* 15.4*  --   --   --  17.1*  NEUTROABS 13.9*  --   --   --   --  11.6*  HGB 11.6* 11.3* 9.7* 9.2* 8.9* 8.3*  HCT 36.0 34.5* 30.8* 28.4* 27.5* 25.4*  MCV 92.1 91.5  --   --   --  91.7  PLT 320 330  --   --   --  273    Basic Metabolic Panel: Recent Labs  Lab 08/16/20 0241 08/16/20 0453 08/17/20 0424  NA 138 142 141  K 4.0 3.7 3.5  CL 108 110 109  CO2 24 23 25   GLUCOSE 188* 158* 111*  BUN 31* 34* 35*  CREATININE 0.67 0.60 0.82  CALCIUM 8.6* 8.5* 8.3*  MG  --   --  2.2    GFR: Estimated Creatinine Clearance: 94.8  mL/min (by C-G formula based on SCr of 0.82 mg/dL).  Liver Function Tests: No results for input(s): AST, ALT, ALKPHOS, BILITOT, PROT, ALBUMIN in the last 168 hours.  CBG: Recent Labs  Lab 08/16/20 1645 08/16/20 2044 08/17/20 0754 08/17/20 1145 08/17/20 1650  GLUCAP 146* 168* 120* 94 103*     Recent Results (from the past 240 hour(s))  Resp Panel by RT-PCR (Flu A&B, Covid) Nasopharyngeal Swab     Status: None   Collection Time: 08/16/20  2:41 AM   Specimen: Nasopharyngeal Swab; Nasopharyngeal(NP) swabs in vial transport medium  Result Value Ref Range Status   SARS Coronavirus 2 by RT PCR NEGATIVE NEGATIVE Final    Comment: (NOTE) SARS-CoV-2 target nucleic acids are NOT DETECTED.  The SARS-CoV-2 RNA is generally detectable in upper respiratory specimens during the acute phase of infection. The lowest concentration of SARS-CoV-2 viral copies this assay can detect is 138 copies/mL. A negative result does not preclude SARS-Cov-2 infection and should not be used as the sole basis for treatment or other patient management decisions. A negative result may occur with  improper specimen collection/handling, submission of specimen other than nasopharyngeal swab, presence of viral mutation(s) within the areas targeted by this assay, and inadequate number of viral copies(<138 copies/mL). A negative result must be combined with clinical observations, patient history, and epidemiological information. The expected result is Negative.  Fact Sheet for Patients:  08/18/20  Fact Sheet for Healthcare Providers:  BloggerCourse.com  This test is no t yet approved or cleared by the [employer] FDA and  has been authorized for detection and/or diagnosis of SARS-CoV-2 by FDA under an Emergency Use Authorization (EUA). This EUA will remain  in effect (meaning this test can be used) for the duration of the COVID-19 declaration under  Section 564(b)(1) of the Act, 21 U.S.C.section 360bbb-3(b)(1), unless the authorization is terminated  or revoked sooner.       Influenza A by PCR NEGATIVE NEGATIVE Final   Influenza B by PCR NEGATIVE NEGATIVE Final    Comment: (NOTE) The Xpert Xpress SARS-CoV-2/FLU/RSV plus assay is intended as an aid in the diagnosis of influenza from Nasopharyngeal swab specimens and should not be used as a sole basis for treatment. Nasal washings and aspirates are unacceptable for Xpert Xpress SARS-CoV-2/FLU/RSV testing.  Fact Sheet for Patients: [place]  Fact Sheet for Healthcare Providers: BloggerCourse.com  This test is not yet approved or cleared by the [employer].[place] FDA and has been  authorized for detection and/or diagnosis of SARS-CoV-2 by FDA under an Emergency Use Authorization (EUA). This EUA will remain in effect (meaning this test can be used) for the duration of the COVID-19 declaration under Section 564(b)(1) of the Act, 21 U.S.C. section 360bbb-3(b)(1), unless the authorization is terminated or revoked.  Performed at [facility], [address] [place], [place] [address]          Radiology Studies: No results found.      Scheduled Meds: . amLODipine  5 mg Oral Daily  . amoxicillin-clavulanate  1 tablet Oral Q12H  . bacitracin   Topical BID  . clotrimazole  1 Applicatorful Vaginal QHS  . famotidine  20 mg Oral Daily  . insulin aspart  0-9 Units Subcutaneous TID PC & HS  . irbesartan  37.5 mg Oral Daily  . Lifitegrast  1 drop Both Eyes BID  . metoprolol tartrate  100 mg Oral BID  . pantoprazole (PROTONIX) IV  40 mg Intravenous Q12H  . ramelteon  8 mg Oral QHS  . venlafaxine XR  150 mg Oral Q breakfast   Continuous Infusions: . sodium chloride Stopped (08/16/20 1900)     LOS: 1 day    Time spent: 40 minutes     Ramiro Harvest, MD [facility]   To contact the attending  provider between 7A-7P or the covering provider during after hours 7P-7A, please log into the web site [email or web address] and access using universal [place] password for that web site. If you do not have the password, please call the hospital operator.  08/17/2020, 5:35 PM

## 2020-08-17 NOTE — Progress Notes (Signed)
08/17/2020 6:41 PM  Regina Christensen [id number]  Post-Op Day 9    Temp:  [97.4 F (36.3 C)-99.1 F (37.3 C)] 97.7 F (36.5 C) (04/29 1754) Pulse Rate:  [78-105] 81 (04/29 1754) Resp:  [14-20] 15 (04/29 1754) BP: (86-121)/(50-66) 109/54 (04/29 1754) SpO2:  [98 %-100 %] 100 % (04/29 1754) Weight:  [120.6 kg] 120.6 kg (04/29 0500),     Intake/Output Summary (Last 24 hours) at 08/17/2020 1841 Last data filed at 08/17/2020 1759 Gross per 24 hour  Intake 1788 ml  Output 401 ml  Net 1387 ml    Results for orders placed or performed during the hospital encounter of 08/15/20 (from the past 24 hour(s))  Glucose, capillary     Status: Abnormal   Collection Time: 08/16/20  8:44 PM  Result Value Ref Range   Glucose-Capillary 168 (H) 70 - 99 mg/dL  Hemoglobin and hematocrit, blood     Status: Abnormal   Collection Time: 08/16/20 10:05 PM  Result Value Ref Range   Hemoglobin 8.9 (L) 12.0 - 15.0 g/dL   HCT 14.4 (L) 81.8 - 56.3 %  Basic metabolic panel     Status: Abnormal   Collection Time: 08/17/20  4:24 AM  Result Value Ref Range   Sodium 141 135 - 145 mmol/L   Potassium 3.5 3.5 - 5.1 mmol/L   Chloride 109 98 - 111 mmol/L   CO2 25 22 - 32 mmol/L   Glucose, Bld 111 (H) 70 - 99 mg/dL   BUN 35 (H) 8 - 23 mg/dL   Creatinine, Ser 1.49 0.44 - 1.00 mg/dL   Calcium 8.3 (L) 8.9 - 10.3 mg/dL   GFR, Estimated >70 >26 mL/min   Anion gap 7 5 - 15  CBC with Differential/Platelet     Status: Abnormal   Collection Time: 08/17/20  4:24 AM  Result Value Ref Range   WBC 17.1 (H) 4.0 - 10.5 K/uL   RBC 2.77 (L) 3.87 - 5.11 MIL/uL   Hemoglobin 8.3 (L) 12.0 - 15.0 g/dL   HCT 37.8 (L) 58.8 - 50.2 %   MCV 91.7 80.0 - 100.0 fL   MCH 30.0 26.0 - 34.0 pg   MCHC 32.7 30.0 - 36.0 g/dL   RDW 77.4 (H) 12.8 - 78.6 %   Platelets 273 150 - 400 K/uL   nRBC 0.0 0.0 - 0.2 %   Neutrophils Relative % 67 %   Neutro Abs 11.6 (H) 1.7 - 7.7 K/uL   Lymphocytes Relative 24 %   Lymphs Abs 4.1 (H) 0.7 - 4.0 K/uL    Monocytes Relative 7 %   Monocytes Absolute 1.1 (H) 0.1 - 1.0 K/uL   Eosinophils Relative 0 %   Eosinophils Absolute 0.0 0.0 - 0.5 K/uL   Basophils Relative 1 %   Basophils Absolute 0.1 0.0 - 0.1 K/uL   WBC Morphology MILD LEFT SHIFT (1-5% METAS, OCC MYELO, OCC BANDS)    Smear Review Normal platelet morphology    Immature Granulocytes 1 %   Abs Immature Granulocytes 0.15 (H) 0.00 - 0.07 K/uL   Polychromasia PRESENT   Magnesium     Status: None   Collection Time: 08/17/20  4:24 AM  Result Value Ref Range   Magnesium 2.2 1.7 - 2.4 mg/dL  Glucose, capillary     Status: Abnormal   Collection Time: 08/17/20  7:54 AM  Result Value Ref Range   Glucose-Capillary 120 (H) 70 - 99 mg/dL  Prepare RBC (crossmatch)     Status: None  Collection Time: 08/17/20 10:53 AM  Result Value Ref Range   Order Confirmation      ORDER PROCESSED BY BLOOD BANK Performed at [facility], [address] [place], [place] [address]   Vitamin B12     Status: None   Collection Time: 08/17/20 10:53 AM  Result Value Ref Range   Vitamin B-12 405 180 - 914 pg/mL  Folate     Status: None   Collection Time: 08/17/20 10:53 AM  Result Value Ref Range   Folate 8.9 >5.9 ng/mL  Iron and TIBC     Status: Abnormal   Collection Time: 08/17/20 10:53 AM  Result Value Ref Range   Iron 54 28 - 170 ug/dL   TIBC 952 (L) 841 - 324 ug/dL   Saturation Ratios 25 10.4 - 31.8 %   UIBC 162 ug/dL  Ferritin     Status: None   Collection Time: 08/17/20 10:53 AM  Result Value Ref Range   Ferritin 41 11 - 307 ng/mL  Reticulocytes     Status: Abnormal   Collection Time: 08/17/20 10:53 AM  Result Value Ref Range   Retic Ct Pct 3.3 (H) 0.4 - 3.1 %   RBC. 2.37 (L) 3.87 - 5.11 MIL/uL   Retic Count, Absolute 78.4 19.0 - 186.0 K/uL   Immature Retic Fract 31.4 (H) 2.3 - 15.9 %  ABO/Rh     Status: None   Collection Time: 08/17/20 10:53 AM  Result Value Ref Range   ABO/RH(D)      B POS Performed at [facility],  [address] [place], [place] [address]   Glucose, capillary     Status: None   Collection Time: 08/17/20 11:45 AM  Result Value Ref Range   Glucose-Capillary 94 70 - 99 mg/dL  Glucose, capillary     Status: Abnormal   Collection Time: 08/17/20  4:50 PM  Result Value Ref Range   Glucose-Capillary 103 (H) 70 - 99 mg/dL    SUBJECTIVE: No bleeding no in about 30 hours.  She has been off Eliquis now for almost 2 days.  She feels like there is a little bit of drainage inside her nose but is mostly old blood that is coming out around her packing.  No fresh blood has been noted.  She been tolerating liquid diet well and resting.  She is felt weak when she stands up as her blood pressures dropped and her antihypertensive has been stopped.  Her hemoglobin was low when she is been given 1 unit of packed cells.  She not having any significant pain right now.  OBJECTIVE: Her vital signs are stable although her blood pressure is low.  She is in sinus rhythm currently.  Her packing is in place and is now turning blacker as it is drying in the front of the nose.  No active bleeding noted.  She had a small string of old blood in the back of her nasopharynx but no sign of any active bleeding or blood staining on the posterior pharyngeal wall.  IMPRESSION: She has had epistaxis that seems to be controlled for the time being.  She had anemia secondary to acute blood loss and is received some packed cells.  She has been mildly dehydrated and her blood pressure has dropped some and she is receiving fluids currently.  PLAN: Whenever she is hemodynamically stable then I think she can be discharged home.  Working to keep the packing in until Tuesday and see her then  to remove the packing.  Since she is in sinus rhythm right now and doing well I would rather continue to hold the Eliquis to make sure that she really clots well and we get the packing out without starting bleeding again.  Once we have that out on Tuesday  we can decide when to restart the Eliquis.  She knows she will need to rest at home until we see her at the office.  She can continue drinking plenty liquids but I think she can increase to soft diet.  If she does any heavy chewing she will create more pressure in her face and nose and can increase risk of bleeding.  Cammy Copa 08/17/2020, 6:41 PM

## 2020-08-18 DIAGNOSIS — Z8673 Personal history of transient ischemic attack (TIA), and cerebral infarction without residual deficits: Secondary | ICD-10-CM | POA: Diagnosis not present

## 2020-08-18 DIAGNOSIS — F32A Depression, unspecified: Secondary | ICD-10-CM | POA: Diagnosis present

## 2020-08-18 DIAGNOSIS — Z79899 Other long term (current) drug therapy: Secondary | ICD-10-CM | POA: Diagnosis not present

## 2020-08-18 DIAGNOSIS — I5032 Chronic diastolic (congestive) heart failure: Secondary | ICD-10-CM | POA: Diagnosis present

## 2020-08-18 DIAGNOSIS — Z833 Family history of diabetes mellitus: Secondary | ICD-10-CM | POA: Diagnosis not present

## 2020-08-18 DIAGNOSIS — Z7901 Long term (current) use of anticoagulants: Secondary | ICD-10-CM | POA: Diagnosis not present

## 2020-08-18 DIAGNOSIS — Z888 Allergy status to other drugs, medicaments and biological substances status: Secondary | ICD-10-CM | POA: Diagnosis not present

## 2020-08-18 DIAGNOSIS — G4730 Sleep apnea, unspecified: Secondary | ICD-10-CM | POA: Diagnosis present

## 2020-08-18 DIAGNOSIS — B373 Candidiasis of vulva and vagina: Secondary | ICD-10-CM | POA: Diagnosis present

## 2020-08-18 DIAGNOSIS — K219 Gastro-esophageal reflux disease without esophagitis: Secondary | ICD-10-CM | POA: Diagnosis present

## 2020-08-18 DIAGNOSIS — I951 Orthostatic hypotension: Secondary | ICD-10-CM | POA: Diagnosis present

## 2020-08-18 DIAGNOSIS — D62 Acute posthemorrhagic anemia: Secondary | ICD-10-CM | POA: Diagnosis present

## 2020-08-18 DIAGNOSIS — Z20822 Contact with and (suspected) exposure to covid-19: Secondary | ICD-10-CM | POA: Diagnosis present

## 2020-08-18 DIAGNOSIS — F419 Anxiety disorder, unspecified: Secondary | ICD-10-CM | POA: Diagnosis present

## 2020-08-18 DIAGNOSIS — I480 Paroxysmal atrial fibrillation: Secondary | ICD-10-CM | POA: Diagnosis present

## 2020-08-18 DIAGNOSIS — Z6841 Body Mass Index (BMI) 40.0 and over, adult: Secondary | ICD-10-CM | POA: Diagnosis not present

## 2020-08-18 DIAGNOSIS — R040 Epistaxis: Secondary | ICD-10-CM | POA: Diagnosis present

## 2020-08-18 DIAGNOSIS — Z8249 Family history of ischemic heart disease and other diseases of the circulatory system: Secondary | ICD-10-CM | POA: Diagnosis not present

## 2020-08-18 DIAGNOSIS — Z96651 Presence of right artificial knee joint: Secondary | ICD-10-CM | POA: Diagnosis present

## 2020-08-18 DIAGNOSIS — E119 Type 2 diabetes mellitus without complications: Secondary | ICD-10-CM | POA: Diagnosis present

## 2020-08-18 DIAGNOSIS — I110 Hypertensive heart disease with heart failure: Secondary | ICD-10-CM | POA: Diagnosis present

## 2020-08-18 DIAGNOSIS — D72829 Elevated white blood cell count, unspecified: Secondary | ICD-10-CM | POA: Diagnosis present

## 2020-08-18 DIAGNOSIS — K921 Melena: Secondary | ICD-10-CM | POA: Diagnosis present

## 2020-08-18 LAB — BASIC METABOLIC PANEL
Anion gap: 6 (ref 5–15)
BUN: 16 mg/dL (ref 8–23)
CO2: 24 mmol/L (ref 22–32)
Calcium: 8 mg/dL — ABNORMAL LOW (ref 8.9–10.3)
Chloride: 110 mmol/L (ref 98–111)
Creatinine, Ser: 0.79 mg/dL (ref 0.44–1.00)
GFR, Estimated: >60 mL/min (ref >60)
Glucose, Bld: 99 mg/dL (ref 70–99)
Potassium: 4.1 mmol/L (ref 3.5–5.1)
Sodium: 140 mmol/L (ref 135–145)

## 2020-08-18 LAB — CBC WITH DIFFERENTIAL/PLATELET
Abs Immature Granulocytes: 0.13 10*3/uL — ABNORMAL HIGH (ref 0.00–0.07)
Basophils Absolute: 0.1 10*3/uL (ref 0.0–0.1)
Basophils Relative: 0 %
Eosinophils Absolute: 0.1 10*3/uL (ref 0.0–0.5)
Eosinophils Relative: 1 %
HCT: 26.4 % — ABNORMAL LOW (ref 36.0–46.0)
Hemoglobin: 8.5 g/dL — ABNORMAL LOW (ref 12.0–15.0)
Immature Granulocytes: 1 %
Lymphocytes Relative: 23 %
Lymphs Abs: 2.9 10*3/uL (ref 0.7–4.0)
MCH: 30.4 pg (ref 26.0–34.0)
MCHC: 32.2 g/dL (ref 30.0–36.0)
MCV: 94.3 fL (ref 80.0–100.0)
Monocytes Absolute: 0.8 10*3/uL (ref 0.1–1.0)
Monocytes Relative: 7 %
Neutro Abs: 8.8 10*3/uL — ABNORMAL HIGH (ref 1.7–7.7)
Neutrophils Relative %: 68 %
Platelets: 215 10*3/uL (ref 150–400)
RBC: 2.8 MIL/uL — ABNORMAL LOW (ref 3.87–5.11)
RDW: 16.4 % — ABNORMAL HIGH (ref 11.5–15.5)
WBC: 12.9 10*3/uL — ABNORMAL HIGH (ref 4.0–10.5)
nRBC: 0.2 % (ref 0.0–0.2)

## 2020-08-18 LAB — HEMOGLOBIN AND HEMATOCRIT, BLOOD
HCT: 24.4 % — ABNORMAL LOW (ref 36.0–46.0)
Hemoglobin: 7.9 g/dL — ABNORMAL LOW (ref 12.0–15.0)

## 2020-08-18 LAB — GLUCOSE, CAPILLARY
Glucose-Capillary: 86 mg/dL (ref 70–99)
Glucose-Capillary: 98 mg/dL (ref 70–99)
Glucose-Capillary: 99 mg/dL (ref 70–99)
Glucose-Capillary: 98 mg/dL (ref 70–99)

## 2020-08-18 NOTE — Progress Notes (Signed)
PROGRESS NOTE    Regina Christensen  QJJ:[id number] DOB: [birth year] DOA: 08/15/2020 PCP: [name], MD    Chief Complaint  Patient presents with  . Epistaxis    Brief Narrative:  Patient is a pleasant 65 year old female history of chronic diastolic CHF, paroxysmal atrial fibrillation on chronic anticoagulation with Eliquis, hypertension, depression, sleep apnea, history of CVA as well as recent sinus surgery per Dr. Elenore Rota with bilateral endoscopic maxillary antrostomies, right endoscopic total ethmoidectomy with frontal sinusotomy and left endoscopic partial ethmoidectomy as well as endoscopic treatment of both concha bullosa of the middle turbinates (08/08/2020) who presents to the ED with acute sudden onset persistent left-sided epistaxis with no recent trauma ongoing for couple of hours.  Patient seen in the ED given oral Augmentin, bacitracin ointment, Cetacaine and Xylocaine, milligram of IV Versed Afrin nasal spray and Tranxene make acid 500 mg topically with some nasal packing.  Due to ongoing epistaxis and coughing up of blood Dr. York Cerise ED was able to pull out a clot from patient's throat.  Patient admitted for observation.  Patient coughing up more blood this morning with some blood clots.  ENT notified.   Assessment & Plan:   Active Problems:   Hypotension   Hypertension   Epistaxis   Unspecified nondisplaced fracture of surgical neck of left humerus, initial encounter for closed fracture   Acute blood loss anemia   Current use of long term anticoagulation   Left-sided epistaxis   Type 2 diabetes mellitus without complication (HCC)   AF (paroxysmal atrial fibrillation) (HCC)   Gastroesophageal reflux disease   Depression   Orthostasis   Symptomatic anemia  1 persistent left-sided epistaxis with subsequent acute blood loss anemia/symptomatic anemia -Patient presented persistent left epistaxis after recent sinus surgery in the setting of oral anticoagulation of  Eliquis. -Patient status post clot removal by ED physician, Dr. York Cerise the morning of 08/16/2020, with picture as noted below. -Patient noted to have no further coughing of blood clots this morning.  Stated had some bloody bowel movements approximately 08/16/2020.   -Nasal packing in place soaked with dried blood.   -Patient typed and screened.   -Afrin nasal spray as needed.   -Continue to hold Eliquis until cleared by ENT for resumption  -Continue Augmentin.   -Patient seen in consultation by ENT Dr. Elenore Rota who feels bleeding may have slowed down and recommending to continue nasal packing and as a days with consideration of removal on Monday and to clean things out of her nose again at that time, if further bleeding while here may have to replace packing in the room or take to the OR to try to control or visualize bleeding site.   -Patient's hemoglobin currently at 8.5 from 8.3 from 11.6 on admission. -Anemia panel consistent with anemia of chronic disease. -Patient with hypotension, and symptomatic and as was transferred to unit of packed red blood cells.  May need another unit of transfusion later on today.   -Advanced to soft diet which patient was tolerating per ENT recommendations  -Follow H&H  -Per ENT.    2.  Hypotension/orthostasis -Secondary to problem #1 -Patient with complaints of lightheadedness and dizziness when sitting up with which is slowly improving. -Patient noted to be hypotensive with systolic blood pressures in the 80s the morning of 08/17/2020. -Hemoglobin currently at 8.5 from 11.6 on admission  -Status post transfusion 1 unit packed red blood cells.  -Continue to hold antihypertensive medications  -IV fluids. -Serial H&H. -Supportive care.  3.  Anxiety -Continue Ativan as needed.  Trazodone nightly  4.  Hypertension -Patient noted to be hypotensive with systolic blood pressures in the 80s, 08/17/2020.  Patient with complaints of dizziness and as such  antihypertensive medications discontinued.   -Status post transfusion 1 unit packed red blood cells  -Continue hydration.   -Follow.    5.  Well-controlled diabetes mellitus type 2 -Hemoglobin A1c 6.2 (03/01/2020). -Repeat hemoglobin A1c at 6.0 (08/16/2020 ). -CBG 86 this morning . -SSI.  6.  Gastroesophageal reflux disease -Continue Pepcid, PPI.    7.  Paroxysmal atrial fibrillation -Lopressor held, as patient with systolic blood pressures in the 80s.   -Continue to hold anticoagulation secondary to problem #1.   -ENT to advise when anticoagulation may be resumed.   -Cardiology has seen the patient and agree with holding anticoagulation and feel recurrent bleeding risk far outweighs stroke risk at this time and okay to hold Eliquis as long as deemed necessary per ENT.   -Appreciate cardiology input and recommendations.  8.  Depression -Effexor.  9.  Vaginal candidiasis -Status post Diflucan 150 mg p.o. x1  -Continue clotrimazole cream nightly.   DVT prophylaxis: SCDs Code Status: Full Family Communication: Updated patient and daughter at bedside.   Disposition:   Status is: Inpatient    Dispo: The patient is from: Home              Anticipated d/c is to: Home              Patient currently with improving epistaxis, acute blood loss anemia with dizziness.  Not stable for discharge.    Difficult to place patient no       Consultants:   ENT: Dr. Elenore RotaJuengel 08/16/2020  Cardiology: Dr. Mariah MillingGollan 08/16/2020  Procedures:   Clot removal from throat by EDMD Dr. York CeriseForbach.  08/16/2020  Transfuse 1 unit packed red blood cells 08/17/2020  Antimicrobials:   Augmentin 08/16/2020 >>>>>   Subjective: Laying in bed.  Daughter at bedside.  Denies any chest pain.  No shortness of breath.  No abdominal pain.  Still with some dizziness/lightheadedness however some improvement since yesterday.  Denies any overt GI bleed.  States has been having minimal blood noted on tissue when she  dries her nose.  Complaining of left-sided congestion.  Overall feeling better than she did on admission.    Objective: Vitals:   08/17/20 2130 08/18/20 0558 08/18/20 0800 08/18/20 1135  BP: (!) 131/52 (!) 121/56 121/74 115/63  Pulse: 82 88 84 83  Resp: 20 20 20 18   Temp: 98 F (36.7 C) 98.5 F (36.9 C) 98.1 F (36.7 C) 97.8 F (36.6 C)  TempSrc: Oral Oral Oral   SpO2: 100% 100% 95% 99%  Weight:   121.2 kg   Height:        Intake/Output Summary (Last 24 hours) at 08/18/2020 1318 Last data filed at 08/18/2020 1135 Gross per 24 hour  Intake 2203.78 ml  Output 1000 ml  Net 1203.78 ml   Filed Weights   08/15/20 2307 08/17/20 0500 08/18/20 0800  Weight: 118.4 kg 120.6 kg 121.2 kg    Examination:  General exam: Nasal packing soaked with blood and dried. HEENT: [place]/AT.  Oropharynx is clear, no lesions, no exudates.  No blood noted in the posterior pharynx.  Respiratory system: Lungs clear to auscultation bilaterally.  No wheezes, no crackles, no rhonchi.  Normal respiratory effort.  Cardiovascular system: Regular rate rhythm no murmurs rubs or gallops.  No JVD.  No lower  extremity edema. Gastrointestinal system: Abdomen is soft, nontender, nondistended, positive bowel sounds.  No rebound.  No guarding.  Central nervous system: Alert and oriented. No focal neurological deficits. Extremities: Symmetric 5 x 5 power. Skin: No rashes, lesions or ulcers Psychiatry: Judgement and insight appear normal. Mood & affect appropriate.       Data Reviewed: I have personally reviewed following labs and imaging studies  CBC: Recent Labs  Lab 08/16/20 0241 08/16/20 0453 08/16/20 1023 08/16/20 1651 08/16/20 2205 08/17/20 0424 08/17/20 1935 08/18/20 0413  WBC 16.7* 15.4*  --   --   --  17.1*  --  12.9*  NEUTROABS 13.9*  --   --   --   --  11.6*  --  8.8*  HGB 11.6* 11.3*   < > 9.2* 8.9* 8.3* 8.6* 8.5*  HCT 36.0 34.5*   < > 28.4* 27.5* 25.4* 26.2* 26.4*  MCV 92.1 91.5  --   --   --   91.7  --  94.3  PLT 320 330  --   --   --  273  --  215   < > = values in this interval not displayed.    Basic Metabolic Panel: Recent Labs  Lab 08/16/20 0241 08/16/20 0453 08/17/20 0424 08/18/20 0413  NA 138 142 141 140  K 4.0 3.7 3.5 4.1  CL 108 110 109 110  CO2 24 23 25 24   GLUCOSE 188* 158* 111* 99  BUN 31* 34* 35* 16  CREATININE 0.67 0.60 0.82 0.79  CALCIUM 8.6* 8.5* 8.3* 8.0*  MG  --   --  2.2  --     GFR: Estimated Creatinine Clearance: 97.3 mL/min (by C-G formula based on SCr of 0.79 mg/dL).  Liver Function Tests: No results for input(s): AST, ALT, ALKPHOS, BILITOT, PROT, ALBUMIN in the last 168 hours.  CBG: Recent Labs  Lab 08/17/20 1145 08/17/20 1650 08/17/20 2116 08/18/20 0813 08/18/20 1133  GLUCAP 94 103* 80 86 98     Recent Results (from the past 240 hour(s))  Resp Panel by RT-PCR (Flu A&B, Covid) Nasopharyngeal Swab     Status: None   Collection Time: 08/16/20  2:41 AM   Specimen: Nasopharyngeal Swab; Nasopharyngeal(NP) swabs in vial transport medium  Result Value Ref Range Status   SARS Coronavirus 2 by RT PCR NEGATIVE NEGATIVE Final    Comment: (NOTE) SARS-CoV-2 target nucleic acids are NOT DETECTED.  The SARS-CoV-2 RNA is generally detectable in upper respiratory specimens during the acute phase of infection. The lowest concentration of SARS-CoV-2 viral copies this assay can detect is 138 copies/mL. A negative result does not preclude SARS-Cov-2 infection and should not be used as the sole basis for treatment or other patient management decisions. A negative result may occur with  improper specimen collection/handling, submission of specimen other than nasopharyngeal swab, presence of viral mutation(s) within the areas targeted by this assay, and inadequate number of viral copies(<138 copies/mL). A negative result must be combined with clinical observations, patient history, and epidemiological information. The expected result is  Negative.  Fact Sheet for Patients:  08/18/20  Fact Sheet for Healthcare Providers:  BloggerCourse.com  This test is no t yet approved or cleared by the [place] FDA and  has been authorized for detection and/or diagnosis of SARS-CoV-2 by FDA under an Emergency Use Authorization (EUA). This EUA will remain  in effect (meaning this test can be used) for the duration of the COVID-19 declaration under Section 564(b)(1) of the Act,  21 U.S.C.section 360bbb-3(b)(1), unless the authorization is terminated  or revoked sooner.       Influenza A by PCR NEGATIVE NEGATIVE Final   Influenza B by PCR NEGATIVE NEGATIVE Final    Comment: (NOTE) The Xpert Xpress SARS-CoV-2/FLU/RSV plus assay is intended as an aid in the diagnosis of influenza from Nasopharyngeal swab specimens and should not be used as a sole basis for treatment. Nasal washings and aspirates are unacceptable for Xpert Xpress SARS-CoV-2/FLU/RSV testing.  Fact Sheet for Patients: BloggerCourse.com  Fact Sheet for Healthcare Providers: [employer]ious[employer]ker[employer]it  This test is not yet approved or cleared by the [place] FDA and has been authorized for detection and/or diagnosis of SARS-CoV-2 by FDA under an Emergency Use Authorization (EUA). This EUA will remain in effect (meaning this test can be used) for the duration of the COVID-19 declaration under Section 564(b)(1) of the Act, 21 U.S.C. section 360bbb-3(b)(1), unless the authorization is terminated or revoked.  Performed at [facility], [address] [place], [place] [address]          Radiology Studies: No results found.      Scheduled Meds: . amoxicillin-clavulanate  1 tablet Oral Q12H  . bacitracin   Topical BID  . clotrimazole  1 Applicatorful Vaginal QHS  . famotidine  20 mg Oral Daily  . insulin aspart  0-9 Units Subcutaneous TID  PC & HS  . Lifitegrast  1 drop Both Eyes BID  . pantoprazole (PROTONIX) IV  40 mg Intravenous Q12H  . ramelteon  8 mg Oral QHS  . venlafaxine XR  150 mg Oral Q breakfast   Continuous Infusions: . sodium chloride 125 mL/hr at 08/18/20 1019     LOS: 1 day    Time spent: 40 minutes     Ramiro Harvest, MD [facility]   To contact the attending provider between 7A-7P or the covering provider during after hours 7P-7A, please log into the web site [email or web address] and access using universal [place] password for that web site. If you do not have the password, please call the hospital operator.  08/18/2020, 1:18 PM

## 2020-08-19 DIAGNOSIS — I480 Paroxysmal atrial fibrillation: Secondary | ICD-10-CM | POA: Diagnosis not present

## 2020-08-19 DIAGNOSIS — Z7901 Long term (current) use of anticoagulants: Secondary | ICD-10-CM | POA: Diagnosis not present

## 2020-08-19 DIAGNOSIS — R040 Epistaxis: Secondary | ICD-10-CM | POA: Diagnosis not present

## 2020-08-19 DIAGNOSIS — D62 Acute posthemorrhagic anemia: Secondary | ICD-10-CM | POA: Diagnosis not present

## 2020-08-19 LAB — CBC WITH DIFFERENTIAL/PLATELET
Abs Immature Granulocytes: 0.12 10*3/uL — ABNORMAL HIGH (ref 0.00–0.07)
Basophils Absolute: 0 10*3/uL (ref 0.0–0.1)
Basophils Relative: 0 %
Eosinophils Absolute: 0.1 10*3/uL (ref 0.0–0.5)
Eosinophils Relative: 1 %
HCT: 24.2 % — ABNORMAL LOW (ref 36.0–46.0)
Hemoglobin: 7.7 g/dL — ABNORMAL LOW (ref 12.0–15.0)
Immature Granulocytes: 1 %
Lymphocytes Relative: 22 %
Lymphs Abs: 2.4 10*3/uL (ref 0.7–4.0)
MCH: 29.7 pg (ref 26.0–34.0)
MCHC: 31.8 g/dL (ref 30.0–36.0)
MCV: 93.4 fL (ref 80.0–100.0)
Monocytes Absolute: 0.6 10*3/uL (ref 0.1–1.0)
Monocytes Relative: 6 %
Neutro Abs: 7.6 10*3/uL (ref 1.7–7.7)
Neutrophils Relative %: 70 %
Platelets: 204 10*3/uL (ref 150–400)
RBC: 2.59 MIL/uL — ABNORMAL LOW (ref 3.87–5.11)
RDW: 16.4 % — ABNORMAL HIGH (ref 11.5–15.5)
WBC: 10.8 10*3/uL — ABNORMAL HIGH (ref 4.0–10.5)
nRBC: 0.3 % — ABNORMAL HIGH (ref 0.0–0.2)

## 2020-08-19 LAB — BASIC METABOLIC PANEL
Anion gap: 5 (ref 5–15)
BUN: 9 mg/dL (ref 8–23)
CO2: 25 mmol/L (ref 22–32)
Calcium: 8.3 mg/dL — ABNORMAL LOW (ref 8.9–10.3)
Chloride: 111 mmol/L (ref 98–111)
Creatinine, Ser: 0.62 mg/dL (ref 0.44–1.00)
GFR, Estimated: >60 mL/min (ref >60)
Glucose, Bld: 104 mg/dL — ABNORMAL HIGH (ref 70–99)
Potassium: 4 mmol/L (ref 3.5–5.1)
Sodium: 141 mmol/L (ref 135–145)

## 2020-08-19 LAB — HEMOGLOBIN AND HEMATOCRIT, BLOOD
HCT: 32.1 % — ABNORMAL LOW (ref 36.0–46.0)
Hemoglobin: 10.6 g/dL — ABNORMAL LOW (ref 12.0–15.0)

## 2020-08-19 LAB — GLUCOSE, CAPILLARY
Glucose-Capillary: 93 mg/dL (ref 70–99)
Glucose-Capillary: 111 mg/dL — ABNORMAL HIGH (ref 70–99)
Glucose-Capillary: 94 mg/dL (ref 70–99)
Glucose-Capillary: 102 mg/dL — ABNORMAL HIGH (ref 70–99)

## 2020-08-19 LAB — PREPARE RBC (CROSSMATCH)

## 2020-08-19 MED ORDER — FUROSEMIDE 10 MG/ML IJ SOLN
20.0000 mg | Freq: Once | INTRAMUSCULAR | Status: AC
Start: 2020-08-19 — End: 2020-08-19
  Administered 2020-08-19: 20 mg via INTRAVENOUS
  Filled 2020-08-19: qty 2

## 2020-08-19 MED ORDER — SODIUM CHLORIDE 0.9% IV SOLUTION: Freq: Once | INTRAVENOUS | Status: AC

## 2020-08-19 MED ORDER — ACETAMINOPHEN 325 MG PO TABS
650.0000 mg | ORAL_TABLET | Freq: Once | ORAL | Status: AC
  Administered 2020-08-19: 650 mg via ORAL
  Filled 2020-08-19: qty 2

## 2020-08-19 MED ORDER — DIPHENHYDRAMINE HCL 25 MG PO CAPS
25.0000 mg | ORAL_CAPSULE | Freq: Once | ORAL | Status: AC
  Administered 2020-08-19: 25 mg via ORAL
  Filled 2020-08-19: qty 1

## 2020-08-19 NOTE — Progress Notes (Signed)
PROGRESS NOTE    Regina Christensen  KGY:[id number] DOB: [birth year] DOA: 08/15/2020 PCP: [name], MD    Chief Complaint  Patient presents with  . Epistaxis    Brief Narrative:  Patient is a pleasant 66 year old female history of chronic diastolic CHF, paroxysmal atrial fibrillation on chronic anticoagulation with Eliquis, hypertension, depression, sleep apnea, history of CVA as well as recent sinus surgery per Dr. Elenore Rota with bilateral endoscopic maxillary antrostomies, right endoscopic total ethmoidectomy with frontal sinusotomy and left endoscopic partial ethmoidectomy as well as endoscopic treatment of both concha bullosa of the middle turbinates (08/08/2020) who presents to the ED with acute sudden onset persistent left-sided epistaxis with no recent trauma ongoing for couple of hours.  Patient seen in the ED given oral Augmentin, bacitracin ointment, Cetacaine and Xylocaine, milligram of IV Versed Afrin nasal spray and Tranxene make acid 500 mg topically with some nasal packing.  Due to ongoing epistaxis and coughing up of blood Dr. York Cerise ED was able to pull out a clot from patient's throat.  Patient admitted for observation.  Patient coughing up more blood this morning with some blood clots.  ENT notified.   Assessment & Plan:   Active Problems:   Hypotension   Hypertension   Epistaxis   Unspecified nondisplaced fracture of surgical neck of left humerus, initial encounter for closed fracture   Acute blood loss anemia   Current use of long term anticoagulation   Left-sided epistaxis   Type 2 diabetes mellitus without complication (HCC)   AF (paroxysmal atrial fibrillation) (HCC)   Gastroesophageal reflux disease   Depression   Orthostasis   Symptomatic anemia  1 persistent left-sided epistaxis with subsequent acute blood loss anemia/symptomatic anemia -Patient presented persistent left epistaxis after recent sinus surgery in the setting of oral anticoagulation of  Eliquis. -Patient status post clot removal by ED physician, Dr. York Cerise the morning of 08/16/2020, with picture as noted below. -Patient noted to have no further coughing of blood clots this morning.  Stated had some bloody bowel movements approximately 08/16/2020.   -Nasal packing in place soaked with dried blood.   -Patient noted to have some trickling of blood from the left nare early this morning with hemoglobin slowly trending down currently at 7.7. -Patient typed and screened.   -Afrin nasal spray as needed.   -Continue to hold Eliquis until cleared by ENT for resumption  -Continue Augmentin.   -Patient seen in consultation by ENT Dr. Elenore Rota who feels bleeding may have slowed down and recommending to continue nasal packing and as a days with consideration of removal on Monday and to clean things out of her nose again at that time, if further bleeding while here may have to replace packing in the room or take to the OR to try to control or visualize bleeding site.   -Patient's hemoglobin currently at 7.7 from 8.5 from 8.3 from 11.6 on admission. -Anemia panel consistent with anemia of chronic disease. -Patient with improvement with hypotension, noted to be symptomatic and as such transfused 1 unit packed red blood cells.   -Hemoglobin at 7.7, patient noted to have some trickling of blood from the left nare early this morning.   -Transfused 2 units packed red blood cells.   -Continue current soft diet as recommended by ENT.   -Follow H&H  -Per ENT.    2.  Hypotension/orthostasis -Secondary to problem #1 -Patient with complaints of lightheadedness and dizziness when sitting up with improved with hydration and transfusion of packed red  blood cells. -Patient noted to be hypotensive with systolic blood pressures in the 80s the morning of 08/17/2020. -Hemoglobin currently at 7.7 from 8.5 from 11.6 on admission  -Status post transfusion 1 unit packed red blood cells.  -Continue to hold  antihypertensive medications  -Blood pressure improved and as such IV fluids have been saline locked.   -Patient to receive transfusion today.   -Follow H&H.   -Supportive care.   3.  Anxiety -Ativan as needed.  Trazodone nightly.    4.  Hypertension -Patient noted to be hypotensive with systolic blood pressures in the 80s, 08/17/2020.  Patient with complaints of dizziness and as such antihypertensive medications discontinued.   -Status post transfusion 1 unit packed red blood cells  -Blood pressure improved with hydration.   -IV fluids saline lock.   -Follow.  5.  Well-controlled diabetes mellitus type 2 -Hemoglobin A1c 6.2 (03/01/2020). -Repeat hemoglobin A1c at 6.0 (08/16/2020 ). -CBG 93 this morning.   -Continue SSI.   6.  Gastroesophageal reflux disease -Continue Pepcid.  D/C PPI.   7.  Paroxysmal atrial fibrillation -Lopressor held, as patient with systolic blood pressures in the 80s.   -Continue to hold anticoagulation secondary to problem #1.   -ENT to advise when anticoagulation may be resumed.   -Cardiology has seen the patient and agree with holding anticoagulation and feel recurrent bleeding risk far outweighs stroke risk at this time and okay to hold Eliquis as long as deemed necessary per ENT.   -Appreciate cardiology input and recommendations.  8.  Depression -Effexor.  9.  Vaginal candidiasis -Status post Diflucan 150 mg p.o. x1.   -Clotrimazole cream nightly.   DVT prophylaxis: SCDs Code Status: Full Family Communication: Updated patient and son at bedside.    Disposition:   Status is: Inpatient    Dispo: The patient is from: Home              Anticipated d/c is to: Home              Patient currently with improving epistaxis however some slight oozing of blood from left nare, acute blood loss anemia with hemoglobin trickling down.  Receiving transfusions.  Not stable for discharge.     Difficult to place patient no       Consultants:   ENT:  Dr. Elenore Rota 08/16/2020  Cardiology: Dr. Mariah Milling 08/16/2020  Procedures:   Clot removal from throat by EDMD Dr. York Cerise.  08/16/2020  Transfuse 1 unit packed red blood cells 08/17/2020  Transfused 2 units packed red blood cells pending 08/19/2020  Antimicrobials:   Augmentin 08/16/2020 >>>>>   Subjective: Sitting up in chair.  Noted to have some trickling of blood from left nare around 2 AM this morning.  No chest pain.  No shortness of breath.  No abdominal pain.  Lightheadedness/dizziness improving.   Objective: Vitals:   08/18/20 1500 08/18/20 2023 08/19/20 0624 08/19/20 0736  BP: 137/69 (!) 128/58 (!) 166/77 134/67  Pulse: 94 96 89 86  Resp: 18 17 18    Temp: 97.7 F (36.5 C) 97.8 F (36.6 C) 97.8 F (36.6 C) 98.2 F (36.8 C)  TempSrc: Oral Oral Oral Oral  SpO2: 96% 100% 99% 99%  Weight:   121.7 kg   Height:        Intake/Output Summary (Last 24 hours) at 08/19/2020 1016 Last data filed at 08/19/2020 0746 Gross per 24 hour  Intake 2767.21 ml  Output 3100 ml  Net -332.79 ml   10/19/2020  08/17/20 0500 08/18/20 0800 08/19/20 0624  Weight: 120.6 kg 121.2 kg 121.7 kg    Examination:  General exam: Left nare nasal packing soaked with blood and dried. HEENT: [place]/AT.  Oropharynx clear, no lesions, no exudates.  No blood noted in the posterior pharynx.  Respiratory system: CTA B.  No wheezes, no crackles, no rhonchi.  Normal respiratory effort.   Cardiovascular system: RRR no murmurs rubs or gallops.  No JVD.  No lower extremity edema.  Gastrointestinal system: Abdomen is soft, nontender, nondistended, positive bowel sounds.  No rebound.  No guarding.  Central nervous system: Alert and oriented. No focal neurological deficits. Extremities: Symmetric 5 x 5 power. Skin: No rashes, lesions or ulcers Psychiatry: Judgement and insight appear normal. Mood & affect appropriate.       Data Reviewed: I have personally reviewed following labs and imaging  studies  CBC: Recent Labs  Lab 08/16/20 0241 08/16/20 0453 08/16/20 1023 08/17/20 0424 08/17/20 1935 08/18/20 0413 08/18/20 1451 08/19/20 0415  WBC 16.7* 15.4*  --  17.1*  --  12.9*  --  10.8*  NEUTROABS 13.9*  --   --  11.6*  --  8.8*  --  7.6  HGB 11.6* 11.3*   < > 8.3* 8.6* 8.5* 7.9* 7.7*  HCT 36.0 34.5*   < > 25.4* 26.2* 26.4* 24.4* 24.2*  MCV 92.1 91.5  --  91.7  --  94.3  --  93.4  PLT 320 330  --  273  --  215  --  204   < > = values in this interval not displayed.    Basic Metabolic Panel: Recent Labs  Lab 08/16/20 0241 08/16/20 0453 08/17/20 0424 08/18/20 0413 08/19/20 0415  NA 138 142 141 140 141  K 4.0 3.7 3.5 4.1 4.0  CL 108 110 109 110 111  CO2 24 23 25 24 25   GLUCOSE 188* 158* 111* 99 104*  BUN 31* 34* 35* 16 9  CREATININE 0.67 0.60 0.82 0.79 0.62  CALCIUM 8.6* 8.5* 8.3* 8.0* 8.3*  MG  --   --  2.2  --   --     GFR: Estimated Creatinine Clearance: 97.6 mL/min (by C-G formula based on SCr of 0.62 mg/dL).  Liver Function Tests: No results for input(s): AST, ALT, ALKPHOS, BILITOT, PROT, ALBUMIN in the last 168 hours.  CBG: Recent Labs  Lab 08/18/20 0813 08/18/20 1133 08/18/20 1716 08/18/20 2122 08/19/20 0751  GLUCAP 86 98 99 98 93     Recent Results (from the past 240 hour(s))  Resp Panel by RT-PCR (Flu A&B, Covid) Nasopharyngeal Swab     Status: None   Collection Time: 08/16/20  2:41 AM   Specimen: Nasopharyngeal Swab; Nasopharyngeal(NP) swabs in vial transport medium  Result Value Ref Range Status   SARS Coronavirus 2 by RT PCR NEGATIVE NEGATIVE Final    Comment: (NOTE) SARS-CoV-2 target nucleic acids are NOT DETECTED.  The SARS-CoV-2 RNA is generally detectable in upper respiratory specimens during the acute phase of infection. The lowest concentration of SARS-CoV-2 viral copies this assay can detect is 138 copies/mL. A negative result does not preclude SARS-Cov-2 infection and should not be used as the sole basis for treatment  or other patient management decisions. A negative result may occur with  improper specimen collection/handling, submission of specimen other than nasopharyngeal swab, presence of viral mutation(s) within the areas targeted by this assay, and inadequate number of viral copies(<138 copies/mL). A negative result must be combined with clinical observations,  patient history, and epidemiological information. The expected result is Negative.  Fact Sheet for Patients:  BloggerCourse.com  Fact Sheet for Healthcare Providers:  SeriousBroker.it  This test is no t yet approved or cleared by the [place] FDA and  has been authorized for detection and/or diagnosis of SARS-CoV-2 by FDA under an Emergency Use Authorization (EUA). This EUA will remain  in effect (meaning this test can be used) for the duration of the COVID-19 declaration under Section 564(b)(1) of the Act, 21 U.S.C.section 360bbb-3(b)(1), unless the authorization is terminated  or revoked sooner.       Influenza A by PCR NEGATIVE NEGATIVE Final   Influenza B by PCR NEGATIVE NEGATIVE Final    Comment: (NOTE) The Xpert Xpress SARS-CoV-2/FLU/RSV plus assay is intended as an aid in the diagnosis of influenza from Nasopharyngeal swab specimens and should not be used as a sole basis for treatment. Nasal washings and aspirates are unacceptable for Xpert Xpress SARS-CoV-2/FLU/RSV testing.  Fact Sheet for Patients: BloggerCourse.com  Fact Sheet for Healthcare Providers: SeriousBroker.it  This test is not yet approved or cleared by the [place] FDA and has been authorized for detection and/or diagnosis of SARS-CoV-2 by FDA under an Emergency Use Authorization (EUA). This EUA will remain in effect (meaning this test can be used) for the duration of the COVID-19 declaration under Section 564(b)(1) of the Act, 21 U.S.C. section  360bbb-3(b)(1), unless the authorization is terminated or revoked.  Performed at [facility], [address], [place], [place] [address]          Radiology Studies: No results found.      Scheduled Meds: . sodium chloride   Intravenous Once  . acetaminophen  650 mg Oral Once  . amoxicillin-clavulanate  1 tablet Oral Q12H  . bacitracin   Topical BID  . clotrimazole  1 Applicatorful Vaginal QHS  . diphenhydrAMINE  25 mg Oral Once  . famotidine  20 mg Oral Daily  . furosemide  20 mg Intravenous Once  . insulin aspart  0-9 Units Subcutaneous TID PC & HS  . Lifitegrast  1 drop Both Eyes BID  . pantoprazole (PROTONIX) IV  40 mg Intravenous Q12H  . ramelteon  8 mg Oral QHS  . venlafaxine XR  150 mg Oral Q breakfast   Continuous Infusions:    LOS: 2 days    Time spent: 35 minutes     Ramiro Harvest, MD [facility]   To contact the attending provider between 7A-7P or the covering provider during after hours 7P-7A, please log into the web site [email or web address] and access using universal [place] password for that web site. If you do not have the password, please call the hospital operator.  08/19/2020, 10:16 AM

## 2020-08-19 NOTE — Consult Note (Signed)
Regina Christensen, Regina Christensen [id number] 1956-03-04 Rodolph Bong, MD  Reason for Consult: Persistent epistaxis  HPI: Chart noted and reviewed, patient was admitted last Wednesday night for epistaxis had a pack placed had some subsequent oozing since that time.  Has been seen by Dr. Elenore Rota and Dr. Andee Poles last week.  Plan at that point was to leave the pack in place discontinue the Eliquis have her discharged home of the weekend follow-up Dr. Elenore Rota next week.  I was called this morning as she continues to have a drop in her hemoglobin requiring now her second transfusion.  In speaking with the patient and her family she is not having active bleeding but more just blood-tinged mucus when she touches the sponge in the left nostril.  She has not had any posterior bleeding that she has noted she has not had any more hematemesis since the initial event.  Allergies:  Allergies  Allergen Reactions  . Ace Inhibitors Cough  . Hctz [Hydrochlorothiazide] Rash    face    ROS: Review of systems normal other than 12 systems except per HPI.  PMH:  Past Medical History:  Diagnosis Date  . Anxiety   . Arthritis   . Back pain   . BPPV (benign paroxysmal positional vertigo)   . Chronic diastolic HF (heart failure) (HCC)   . Complication of anesthesia    Post-operative hypoxia requiring supplemental oxygen  . Current use of long term anticoagulation    Apixaban  . CVA (cerebral vascular accident) (HCC) 02/28/2020   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; imaging from [facility]  . Depression   . Edema of both lower extremities   . GERD (gastroesophageal reflux disease)   . Hypertension   . IBS (irritable bowel syndrome)   . Lactose intolerance   . Morbid obesity with BMI of 45.0-49.9, adult (HCC)   . Obesity   . Osteoarthritis   . PAF (paroxysmal atrial fibrillation) (HCC)   . Pre-diabetes   . Sleep apnea    uses CPAP machine sometimes   . Type 2 diabetes mellitus without complication (HCC)   .  Vitamin D deficiency     FH:  Family History  Problem Relation Age of Onset  . Diabetes Mother   . High blood pressure Mother   . Heart disease Father   . Cancer Brother   . Mental illness Neg Hx     SH:  Social History   Socioeconomic History  . Marital status: Divorced    Spouse name: Not on file  . Number of children: 2  . Years of education: Not on file  . Highest education level: Associate degree: occupational, Scientist, product/process development, or vocational program  Occupational History  . Occupation: Retired/ work PT    Employer: [employer]  Tobacco Use  . Smoking status: Never Smoker  . Smokeless tobacco: Never Used  Vaping Use  . Vaping Use: Never used  Substance and Sexual Activity  . Alcohol use: No    Alcohol/week: 0.0 standard drinks    Comment: rare  . Drug use: No  . Sexual activity: Not on file  Other Topics Concern  . 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 ABDOMINAL HYSTERECTOMY    . BREAST EXCISIONAL BIOPSY Left   . ENDOMETRIAL ABLATION     x2  . ENDOSCOPIC CONCHA BULLOSA RESECTION Bilateral 08/08/2020   Procedure: ENDOSCOPIC CONCHA BULLOSA RESECTION;  Surgeon: Vernie Murders, MD;  Location: [facility] OR[facility];  Service: ENT;  Laterality: Bilateral;  . ETHMOIDECTOMY Right 08/08/2020   Procedure: ETHMOIDECTOMY, LEFT PARTIAL ETHMOIDECTOMY;  Surgeon: Vernie Murders, MD;  Location: [facility] OR[facility];  Service: ENT;  Laterality: Right;  . IMAGE GUIDED SINUS SURGERY N/A 08/08/2020   Procedure: IMAGE GUIDED SINUS SURGERY, RIGHT FRONTAL  SINUSOTOMY;  Surgeon: Vernie Murders, MD;  Location: [facility] OR[facility];  Service: ENT;  Laterality: N/A;  . LAPAROSCOPIC SLEEVE GASTRECTOMY    . MAXILLARY ANTROSTOMY Bilateral 08/08/2020   Procedure: MAXILLARY ANTROSTOMY with tissue;  Surgeon: Vernie Murders, MD;  Location: [facility] ORS;  Service: ENT;  Laterality: Bilateral;  . TOTAL KNEE ARTHROPLASTY Right     Physical  Exam: Awake very pleasant answering questions appropriately.  Obvious Merocel sponge in the left nostril.  There is no active anterior bleeding, examination of the posterior pharynx showed no evidence of active bleeding posteriorly.   A/P: Epistaxis following endoscopic sinus surgery-no active bleeding at present, the question remains whether this drop in hemoglobin is solely from her epistaxis or there is some other source of bleeding.  She is currently receiving blood transfusion.  I will alert Dr. Genevive Bi to let him know that she is still in the hospital, he can decide how to proceed with her further care.  If hemoglobin continues to drop in light of the fact that she is no longer actively bleeding from the nose then the search for another source should be undertaken.   Davina Poke 08/19/2020 2:50 PM

## 2020-08-19 NOTE — Progress Notes (Signed)
Pt reports bleeding from left nare - woke up with small trail of blood from nare to her chain.  Pt denies large volume bleeding down her throat.  Advised her to call if she feels bleeding worsens.  She voiced understanding.

## 2020-08-20 ENCOUNTER — Ambulatory Visit (INDEPENDENT_AMBULATORY_CARE_PROVIDER_SITE_OTHER): Payer: BC Managed Care – PPO | Admitting: Adult Health

## 2020-08-20 DIAGNOSIS — D62 Acute posthemorrhagic anemia: Secondary | ICD-10-CM | POA: Diagnosis not present

## 2020-08-20 DIAGNOSIS — R040 Epistaxis: Secondary | ICD-10-CM | POA: Diagnosis not present

## 2020-08-20 DIAGNOSIS — F32A Depression, unspecified: Secondary | ICD-10-CM | POA: Diagnosis not present

## 2020-08-20 DIAGNOSIS — I480 Paroxysmal atrial fibrillation: Secondary | ICD-10-CM | POA: Diagnosis not present

## 2020-08-20 LAB — BASIC METABOLIC PANEL
Anion gap: 8 (ref 5–15)
BUN: 8 mg/dL (ref 8–23)
CO2: 26 mmol/L (ref 22–32)
Calcium: 8.5 mg/dL — ABNORMAL LOW (ref 8.9–10.3)
Chloride: 107 mmol/L (ref 98–111)
Creatinine, Ser: 0.7 mg/dL (ref 0.44–1.00)
GFR, Estimated: >60 mL/min (ref >60)
Glucose, Bld: 103 mg/dL — ABNORMAL HIGH (ref 70–99)
Potassium: 3.5 mmol/L (ref 3.5–5.1)
Sodium: 141 mmol/L (ref 135–145)

## 2020-08-20 LAB — CBC WITH DIFFERENTIAL/PLATELET
Abs Immature Granulocytes: 0.09 10*3/uL — ABNORMAL HIGH (ref 0.00–0.07)
Basophils Absolute: 0 10*3/uL (ref 0.0–0.1)
Basophils Relative: 0 %
Eosinophils Absolute: 0.1 10*3/uL (ref 0.0–0.5)
Eosinophils Relative: 1 %
HCT: 31.3 % — ABNORMAL LOW (ref 36.0–46.0)
Hemoglobin: 10.5 g/dL — ABNORMAL LOW (ref 12.0–15.0)
Immature Granulocytes: 1 %
Lymphocytes Relative: 18 %
Lymphs Abs: 1.9 10*3/uL (ref 0.7–4.0)
MCH: 30 pg (ref 26.0–34.0)
MCHC: 33.5 g/dL (ref 30.0–36.0)
MCV: 89.4 fL (ref 80.0–100.0)
Monocytes Absolute: 0.5 10*3/uL (ref 0.1–1.0)
Monocytes Relative: 5 %
Neutro Abs: 7.6 10*3/uL (ref 1.7–7.7)
Neutrophils Relative %: 75 %
Platelets: 224 10*3/uL (ref 150–400)
RBC: 3.5 MIL/uL — ABNORMAL LOW (ref 3.87–5.11)
RDW: 17 % — ABNORMAL HIGH (ref 11.5–15.5)
WBC: 10.2 10*3/uL (ref 4.0–10.5)
nRBC: 0.3 % — ABNORMAL HIGH (ref 0.0–0.2)

## 2020-08-20 LAB — BPAM RBC
Blood Product Expiration Date: 202205092359
Blood Product Expiration Date: 202205232359
Blood Product Expiration Date: 202205242359
ISSUE DATE / TIME: 202204291248
ISSUE DATE / TIME: 202205011136
ISSUE DATE / TIME: 202205011525
Unit Type and Rh: 7300
Unit Type and Rh: 7300
Unit Type and Rh: 7300

## 2020-08-20 LAB — TYPE AND SCREEN
ABO/RH(D): B POS
Antibody Screen: NEGATIVE
Unit division: 0
Unit division: 0
Unit division: 0

## 2020-08-20 LAB — MAGNESIUM: Magnesium: 2.1 mg/dL (ref 1.7–2.4)

## 2020-08-20 LAB — GLUCOSE, CAPILLARY
Glucose-Capillary: 105 mg/dL — ABNORMAL HIGH (ref 70–99)
Glucose-Capillary: 136 mg/dL — ABNORMAL HIGH (ref 70–99)

## 2020-08-20 MED ORDER — HYDROCODONE-ACETAMINOPHEN 5-325 MG PO TABS: 1.0000 | ORAL_TABLET | Freq: Four times a day (QID) | ORAL | 0 refills | Status: DC | PRN

## 2020-08-20 MED ORDER — CLOTRIMAZOLE 1 % VA CREA: 1.0000 | TOPICAL_CREAM | Freq: Every day | VAGINAL | 0 refills | Status: AC

## 2020-08-20 MED ORDER — AMOXICILLIN-POT CLAVULANATE 875-125 MG PO TABS: 1.0000 | ORAL_TABLET | Freq: Two times a day (BID) | ORAL | 0 refills | Status: AC

## 2020-08-20 MED ORDER — ACETAMINOPHEN ER 650 MG PO TBCR: 650.0000 mg | EXTENDED_RELEASE_TABLET | Freq: Three times a day (TID) | ORAL | Status: DC | PRN

## 2020-08-20 MED ORDER — POTASSIUM CHLORIDE CRYS ER 20 MEQ PO TBCR
40.0000 meq | EXTENDED_RELEASE_TABLET | Freq: Once | ORAL | Status: AC
  Administered 2020-08-20: 40 meq via ORAL
  Filled 2020-08-20: qty 2

## 2020-08-20 MED ORDER — OLMESARTAN MEDOXOMIL 40 MG PO TABS: 40.0000 mg | ORAL_TABLET | Freq: Every day | ORAL | 0 refills | Status: DC

## 2020-08-20 NOTE — Discharge Summary (Addendum)
Physician Discharge Summary  LARI LINSON ZOX:[id number] DOB: 1955/08/19 DOA: 08/15/2020  PCP: [name], MD  Admit date: 08/15/2020 Discharge date: 08/20/2020  Time spent: 55 minutes  Recommendations for Outpatient Follow-up:  1. Follow-up with Dr.Juengel in 4 days.  Office will call with appointment time.  On follow-up patient will need a CBC done to follow-up on hemoglobin. 2. Follow-up with [name], MD as scheduled.   Discharge Diagnoses:  Active Problems:   Hypotension   Essential hypertension   Epistaxis   Unspecified nondisplaced fracture of surgical neck of left humerus, initial encounter for closed fracture   Acute blood loss anemia   Current use of long term anticoagulation   Left-sided epistaxis   Type 2 diabetes mellitus without complication (HCC)   AF (paroxysmal atrial fibrillation) (HCC)   Gastroesophageal reflux disease   Depression   Orthostasis   Symptomatic anemia   Discharge Condition: Stable and improved  Diet recommendation: Soft diet  Filed Weights   08/17/20 0500 08/18/20 0800 08/19/20 0624  Weight: 120.6 kg 121.2 kg 121.7 kg    History of present illness:  HPI per Dr. Sherre Lain TAIJAH MACRAE is a 65 y.o. female with medical history significant for chronic diastolic CHF, paroxysmal atrial fibrillation on Eliquis, hypertension, depression, sleep apnea and CVA as well as recent sinus surgery by Dr. Elenore Rota with bilateral endoscopic maxillary antrostomies, right endoscopic total ethmoidectomy with frontal sinusotomy and left endoscopic partial ethmoidectomy as well as endoscopic trimming of both concha bullosa of the middle turbinates.  She presented to emergency room with acute onset of persistent left-sided epistaxis and acute onset with no recent trauma.  This had been going on for couple of hours before she came to the ER.  She had no Afrin at home and applying pressure did not help.  No fever or chills.  She vomited twice in the ER.   No other bleeding diathesis.  No chest pain or dyspnea or cough or wheezing or hemoptysis.  No dysuria, oliguria or hematuria or flank pain.  She denies any presyncope or syncope.  ED Course: Blood pressure was 138/91 when she came with otherwise normal vital signs.  Labs revealed a blood glucose of 188 with otherwise normal BMP.  CBC showed leukocytosis of 16.7 with neutrophilia and hemoglobin was 11.6 and hematocrit 36 compared to 14 and 42.1 on 08/06/2020.  Influenza antigens and COVID-19 PCR came back negative. EKG as reviewed by me : Showed sinus tachycardia with rate of 101 with left ventricular hypertrophy and T wave inversion inferiorly.  Patient was given p.o. Augmentin, bacitracin ointment, Cetacaine and Xylocaine, 1 mg of IV Versed, Afrin nasal spray and tranexamic acid 500 mg topically.  Contact was made with Dr. Willeen Cass who recommended keeping her nasal pack for 5 to 7 days and follow-up on an outpatient basis with Dr.  Elenore Rota.  The patient continued to have leaking despite medications given below and therefore she will be admitted to an observation medical monitored bed for further evaluation and management.  Hospital Course:  1 persistent left-sided epistaxis with subsequent acute blood loss anemia/symptomatic anemia -Patient presented persistent left epistaxis after recent sinus surgery in the setting of oral anticoagulation of Eliquis. -Patient status post clot removal by ED physician, Dr. York Cerise the morning of 08/16/2020. -Patient noted to have no further coughing of blood clots this morning for most of the hospitalization.   -Stated had some bloody bowel movements approximately 08/16/2020.   -Nasal packing in place soaked with dried blood.   -  Patient noted to have some trickling of blood from the left nare the morning of 08/19/2020, with hemoglobin slowly trending down to 7.7. -Patient typed and screened.   -Afrin nasal spray as needed.   -Anticoagulation of Eliquis was  discontinued during the hospitalization.   -Patient noted to have been transfused a total of 3 units packed red blood cells during the hospitalization.   -Patient maintained on Augmentin and will be discharged home on 7 more days of Augmentin.  -Patient seen in consultation by ENT Dr. Elenore RotaJuengel who feels bleeding may have slowed down and recommended to continue nasal packing and as a days with consideration of removal on Monday/Tuesday and to clean things out of her nose again at that time, if further bleeding while here may have to replace packing in the room or take to the OR to try to control or visualize bleeding site.   -Patient's hemoglobin stabilized at 10.5 by day of discharge from 7.7 from 8.5 from 8.3 from 11.6 on admission. -Anemia panel consistent with anemia of chronic disease. -Patient improved clinically, hypotension resolved, hemoglobin stabilized at 10.5.   -Patient cleared by ENT for discharge on soft diet and liquid diet with close outpatient follow-up in a few days for further management.   -Patient will be discharged in stable and improved condition. -Outpatient follow-up with a ENT  2.  Hypotension/orthostasis -Secondary to problem #1 -Patient with complaints of lightheadedness and dizziness when sitting up which improved with hydration and transfusion of packed red blood cells. -Patient noted to be hypotensive with systolic blood pressures in the 80s the morning of 08/17/2020. -Hemoglobin trickled down as low as 7.7 from 11.6 on admission.   -Patient received a total of 3 units packed red blood cells during the hospitalization with hemoglobin stabilizing at 10.5 by day of discharge.   -Antihypertensive medications were held during the hospitalization and will be resumed on discharge  -Hypotension/orthostasis had resolved by day of discharge.    3.  Anxiety -Ativan as needed.  Trazodone nightly.    4.  Hypertension -Patient noted to be hypotensive with systolic blood  pressures in the 80s, 08/17/2020.  Patient with complaints of dizziness and as such antihypertensive medications discontinued.   -Status post transfusion 3 unit packed red blood cells during the hospitalization. -Blood pressure improved and symptoms of dizziness and lightheadedness had resolved by day of discharge.   -Patient's Norvasc and Lopressor will be resumed on discharge.  Patient's ARB will be resumed 5 days post discharge.   -Outpatient follow-up.    5.  Well-controlled diabetes mellitus type 2 -Hemoglobin A1c 6.2 (03/01/2020). -Repeat hemoglobin A1c at 6.0 (08/16/2020 ). -Patient maintained on sliding scale insulin.   -Outpatient follow-up.   6.  Gastroesophageal reflux disease -Patient maintained on Pepcid during the hospitalization.    7.  Paroxysmal atrial fibrillation -Lopressor held, as patient with systolic blood pressures in the 80s during the hospitalization.   -Anticoagulation was held secondary to problem #1.   -Cardiology has seen the patient and agree with holding anticoagulation and feel recurrent bleeding risk far outweighs stroke risk at this time and okay to hold Eliquis as long as deemed necessary per ENT.   -Outpatient follow-up.  8.  Depression -Remained stable on home regimen of Effexor.  9.  Vaginal candidiasis -Status post Diflucan 150 mg p.o. x1.   -Patient placed on clotrimazole cream nightly with clinical improvement.   -Patient be discharged on 3 more days of clotrimazole cream nightly.  Procedures:  Clot  removal from throat by EDMD Dr. York Cerise.  08/16/2020  Transfuse 1 unit packed red blood cells 08/17/2020  Transfused 2 units packed red blood cells 08/19/2020    Consultations:  ENT: Dr. Elenore Rota 08/16/2020  Cardiology: Dr. Mariah Milling 08/16/2020    Discharge Exam: Vitals:   08/20/20 0750 08/20/20 1209  BP: 125/75 (!) 142/63  Pulse: 84 90  Resp: 18 18  Temp: 98.1 F (36.7 C) (!) 97.5 F (36.4 C)  SpO2: 97% 100%    General:  NAD Cardiovascular: RRR Respiratory: CTAB  Discharge Instructions   Discharge Instructions    Diet - low sodium heart healthy   Complete by: As directed    Soft diet.   Discharge wound care:   Complete by: As directed    As above   Increase activity slowly   Complete by: As directed      Allergies as of 08/20/2020      Reactions   Ace Inhibitors Cough   Hctz [hydrochlorothiazide] Rash   face      Medication List    STOP taking these medications   apixaban 5 MG Tabs tablet Commonly known as: Eliquis   BIOTIN MAXIMUM PO   cephALEXin 500 MG capsule Commonly known as: Keflex   enoxaparin 120 MG/0.8ML injection Commonly known as: LOVENOX   multivitamin with minerals tablet   predniSONE 10 MG tablet Commonly known as: DELTASONE     TAKE these medications   acetaminophen 650 MG CR tablet Commonly known as: TYLENOL Take 1 tablet (650 mg total) by mouth every 8 (eight) hours as needed for pain. What changed: how much to take   amLODipine 5 MG tablet Commonly known as: NORVASC Take 1 tablet (5 mg total) by mouth daily.   amoxicillin-clavulanate 875-125 MG tablet Commonly known as: AUGMENTIN Take 1 tablet by mouth every 12 (twelve) hours for 7 days.   clotrimazole 1 % vaginal cream Commonly known as: GYNE-LOTRIMIN Place 1 Applicatorful vaginally at bedtime for 3 days.   famotidine 20 MG tablet Commonly known as: PEPCID Take 1 tablet (20 mg total) by mouth daily.   HYDROcodone-acetaminophen 5-325 MG tablet Commonly known as: NORCO/VICODIN Take 1-2 tablets by mouth every 6 (six) hours as needed for moderate pain.   meclizine 12.5 MG tablet Commonly known as: ANTIVERT Take 1 tablet (12.5 mg total) by mouth 3 (three) times daily as needed for dizziness.   metoprolol tartrate 100 MG tablet Commonly known as: LOPRESSOR Take 1 tablet (100 mg total) by mouth 2 (two) times daily.   olmesartan 40 MG tablet Commonly known as: BENICAR Take 1 tablet (40 mg  total) by mouth daily. Start taking on: Aug 25, 2020 What changed: These instructions start on Aug 25, 2020. If you are unsure what to do until then, ask your doctor or other care provider.   Ozempic (1 MG/DOSE) 2 MG/1.5ML Sopn Generic drug: Semaglutide (1 MG/DOSE) Inject 1 mg into the skin once a week. Sunday   ramelteon 8 MG tablet Commonly known as: ROZEREM Take 1 tablet (8 mg total) by mouth at bedtime.   triamcinolone 55 MCG/ACT Aero nasal inhaler Commonly known as: NASACORT Place 2 sprays into the nose daily.   venlafaxine XR 150 MG 24 hr capsule Commonly known as: EFFEXOR-XR TAKE 1 CAPSULE DAILY WITH BREAKFAST   Vitamin D (Ergocalciferol) 1.25 MG (50000 UNIT) Caps capsule Commonly known as: DRISDOL Take 1 capsule (50,000 Units total) by mouth every 7 (seven) days. What changed: additional instructions   Xiidra 5 %  Soln Generic drug: Lifitegrast Place 1 drop into both eyes 2 (two) times daily.            Discharge Care Instructions  (From admission, onward)         Start     Ordered   08/20/20 0000  Discharge wound care:       Comments: As above   08/20/20 1415         Allergies  Allergen Reactions  . Ace Inhibitors Cough  . Hctz [Hydrochlorothiazide] Rash    face    Follow-up Information    Vernie Murders, MD. Schedule an appointment as soon as possible for a visit in 4 days.   Specialty: Otolaryngology Why: Office will call with follow-up appointment time. Contact information: [address] [place] [address] [phone]        [name], MD.   Specialty: Family Medicine Why: Follow-up as scheduled. Contact information: [address] [place] [address] [phone]        Debbe Odea, MD On 08/30/2020.   Specialties: Cardiology, Radiology Why: @ 9:30am Patient will see the PA Contact information: [address] [place] [address] [phone]                The results  of significant diagnostics from this hospitalization (including imaging, microbiology, ancillary and laboratory) are listed below for reference.    Significant Diagnostic Studies: No results found.  Microbiology: Recent Results (from the past 240 hour(s))  Resp Panel by RT-PCR (Flu A&B, Covid) Nasopharyngeal Swab     Status: None   Collection Time: 08/16/20  2:41 AM   Specimen: Nasopharyngeal Swab; Nasopharyngeal(NP) swabs in vial transport medium  Result Value Ref Range Status   SARS Coronavirus 2 by RT PCR NEGATIVE NEGATIVE Final    Comment: (NOTE) SARS-CoV-2 target nucleic acids are NOT DETECTED.  The SARS-CoV-2 RNA is generally detectable in upper respiratory specimens during the acute phase of infection. The lowest concentration of SARS-CoV-2 viral copies this assay can detect is 138 copies/mL. A negative result does not preclude SARS-Cov-2 infection and should not be used as the sole basis for treatment or other patient management decisions. A negative result may occur with  improper specimen collection/handling, submission of specimen other than nasopharyngeal swab, presence of viral mutation(s) within the areas targeted by this assay, and inadequate number of viral copies(<138 copies/mL). A negative result must be combined with clinical observations, patient history, and epidemiological information. The expected result is Negative.  Fact Sheet for Patients:  BloggerCourse.com  Fact Sheet for Healthcare Providers:  SeriousBroker.it  This test is no t yet approved or cleared by the [place] FDA and  has been authorized for detection and/or diagnosis of SARS-CoV-2 by FDA under an Emergency Use Authorization (EUA). This EUA will remain  in effect (meaning this test can be used) for the duration of the COVID-19 declaration under Section 564(b)(1) of the Act, 21 U.S.C.section 360bbb-3(b)(1), unless the authorization is  terminated  or revoked sooner.       Influenza A by PCR NEGATIVE NEGATIVE Final   Influenza B by PCR NEGATIVE NEGATIVE Final    Comment: (NOTE) The Xpert Xpress SARS-CoV-2/FLU/RSV plus assay is intended as an aid in the diagnosis of influenza from Nasopharyngeal swab specimens and should not be used as a sole basis for treatment. Nasal washings and aspirates are unacceptable for Xpert Xpress SARS-CoV-2/FLU/RSV testing.  Fact Sheet for Patients: BloggerCourse.com  Fact Sheet for Healthcare Providers:  SeriousBroker.it  This test is not yet approved or cleared by the [place] and has been authorized for detection and/or diagnosis of SARS-CoV-2 by FDA under an Emergency Use Authorization (EUA). This EUA will remain in effect (meaning this test can be used) for the duration of the COVID-19 declaration under Section 564(b)(1) of the Act, 21 U.S.C. section 360bbb-3(b)(1), unless the authorization is terminated or revoked.  Performed at [facility], [address] [place], [place] [address]      Labs: Basic Metabolic Panel: Recent Labs  Lab 08/16/20 0453 08/17/20 0424 08/18/20 0413 08/19/20 0415 08/20/20 0555  NA 142 141 140 141 141  K 3.7 3.5 4.1 4.0 3.5  CL 110 109 110 111 107  CO2 23 25 24 25 26   GLUCOSE 158* 111* 99 104* 103*  BUN 34* 35* 16 9 8   CREATININE 0.60 0.82 0.79 0.62 0.70  CALCIUM 8.5* 8.3* 8.0* 8.3* 8.5*  MG  --  2.2  --   --  2.1   Liver Function Tests: No results for input(s): AST, ALT, ALKPHOS, BILITOT, PROT, ALBUMIN in the last 168 hours. No results for input(s): LIPASE, AMYLASE in the last 168 hours. No results for input(s): AMMONIA in the last 168 hours. CBC: Recent Labs  Lab 08/16/20 0241 08/16/20 0453 08/16/20 1023 08/17/20 0424 08/17/20 1935 08/18/20 0413 08/18/20 1451 08/19/20 0415 08/19/20 2039 08/20/20 0555  WBC 16.7* 15.4*  --  17.1*  --  12.9*  --  10.8*  --   10.2  NEUTROABS 13.9*  --   --  11.6*  --  8.8*  --  7.6  --  7.6  HGB 11.6* 11.3*   < > 8.3*   < > 8.5* 7.9* 7.7* 10.6* 10.5*  HCT 36.0 34.5*   < > 25.4*   < > 26.4* 24.4* 24.2* 32.1* 31.3*  MCV 92.1 91.5  --  91.7  --  94.3  --  93.4  --  89.4  PLT 320 330  --  273  --  215  --  204  --  224   < > = values in this interval not displayed.   Cardiac Enzymes: No results for input(s): CKTOTAL, CKMB, CKMBINDEX, TROPONINI in the last 168 hours. BNP: BNP (last 3 results) No results for input(s): BNP in the last 8760 hours.  ProBNP (last 3 results) No results for input(s): PROBNP in the last 8760 hours.  CBG: Recent Labs  Lab 08/19/20 1151 08/19/20 1622 08/19/20 2048 08/20/20 0751 08/20/20 1209  GLUCAP 111* 94 102* 105* 136*       Signed:  10/20/20 MD.  [facility] 08/20/2020, 3:18 PM

## 2020-08-20 NOTE — Discharge Instr - Supplementary Instructions (Addendum)
Monitor for bleeding.

## 2020-08-20 NOTE — Progress Notes (Incomplete)
PROGRESS NOTE    Regina Christensen  KGY:[id number] DOB: [birth year] DOA: 08/15/2020 PCP: [name], MD    Chief Complaint  Patient presents with  . Epistaxis    Brief Narrative:  Patient is a pleasant 66 year old female history of chronic diastolic CHF, paroxysmal atrial fibrillation on chronic anticoagulation with Eliquis, hypertension, depression, sleep apnea, history of CVA as well as recent sinus surgery per Dr. Elenore Rota with bilateral endoscopic maxillary antrostomies, right endoscopic total ethmoidectomy with frontal sinusotomy and left endoscopic partial ethmoidectomy as well as endoscopic treatment of both concha bullosa of the middle turbinates (08/08/2020) who presents to the ED with acute sudden onset persistent left-sided epistaxis with no recent trauma ongoing for couple of hours.  Patient seen in the ED given oral Augmentin, bacitracin ointment, Cetacaine and Xylocaine, milligram of IV Versed Afrin nasal spray and Tranxene make acid 500 mg topically with some nasal packing.  Due to ongoing epistaxis and coughing up of blood Dr. York Cerise ED was able to pull out a clot from patient's throat.  Patient admitted for observation.  Patient coughing up more blood this morning with some blood clots.  ENT notified.   Assessment & Plan:   Active Problems:   Hypotension   Hypertension   Epistaxis   Unspecified nondisplaced fracture of surgical neck of left humerus, initial encounter for closed fracture   Acute blood loss anemia   Current use of long term anticoagulation   Left-sided epistaxis   Type 2 diabetes mellitus without complication (HCC)   AF (paroxysmal atrial fibrillation) (HCC)   Gastroesophageal reflux disease   Depression   Orthostasis   Symptomatic anemia  1 persistent left-sided epistaxis with subsequent acute blood loss anemia/symptomatic anemia -Patient presented persistent left epistaxis after recent sinus surgery in the setting of oral anticoagulation of  Eliquis. -Patient status post clot removal by ED physician, Dr. York Cerise the morning of 08/16/2020, with picture as noted below. -Patient noted to have no further coughing of blood clots this morning.  Stated had some bloody bowel movements approximately 08/16/2020.   -Nasal packing in place soaked with dried blood.   -Patient noted to have some trickling of blood from the left nare early this morning with hemoglobin slowly trending down currently at 7.7. -Patient typed and screened.   -Afrin nasal spray as needed.   -Continue to hold Eliquis until cleared by ENT for resumption  -Continue Augmentin.   -Patient seen in consultation by ENT Dr. Elenore Rota who feels bleeding may have slowed down and recommending to continue nasal packing and as a days with consideration of removal on Monday and to clean things out of her nose again at that time, if further bleeding while here may have to replace packing in the room or take to the OR to try to control or visualize bleeding site.   -Patient's hemoglobin currently at 7.7 from 8.5 from 8.3 from 11.6 on admission. -Anemia panel consistent with anemia of chronic disease. -Patient with improvement with hypotension, noted to be symptomatic and as such transfused 1 unit packed red blood cells.   -Hemoglobin at 7.7, patient noted to have some trickling of blood from the left nare early this morning.   -Transfused 2 units packed red blood cells.   -Continue current soft diet as recommended by ENT.   -Follow H&H  -Per ENT.    2.  Hypotension/orthostasis -Secondary to problem #1 -Patient with complaints of lightheadedness and dizziness when sitting up with improved with hydration and transfusion of packed red  blood cells. -Patient noted to be hypotensive with systolic blood pressures in the 80s the morning of 08/17/2020. -Hemoglobin currently at 7.7 from 8.5 from 11.6 on admission  -Status post transfusion 1 unit packed red blood cells.  -Continue to hold  antihypertensive medications  -Blood pressure improved and as such IV fluids have been saline locked.   -Patient to receive transfusion today.   -Follow H&H.   -Supportive care.   3.  Anxiety -Ativan as needed.  Trazodone nightly.    4.  Hypertension -Patient noted to be hypotensive with systolic blood pressures in the 80s, 08/17/2020.  Patient with complaints of dizziness and as such antihypertensive medications discontinued.   -Status post transfusion 1 unit packed red blood cells  -Blood pressure improved with hydration.   -IV fluids saline lock.   -Follow.  5.  Well-controlled diabetes mellitus type 2 -Hemoglobin A1c 6.2 (03/01/2020). -Repeat hemoglobin A1c at 6.0 (08/16/2020 ). -CBG 93 this morning.   -Continue SSI.   6.  Gastroesophageal reflux disease -Continue Pepcid.  D/C PPI.   7.  Paroxysmal atrial fibrillation -Lopressor held, as patient with systolic blood pressures in the 80s.   -Continue to hold anticoagulation secondary to problem #1.   -ENT to advise when anticoagulation may be resumed.   -Cardiology has seen the patient and agree with holding anticoagulation and feel recurrent bleeding risk far outweighs stroke risk at this time and okay to hold Eliquis as long as deemed necessary per ENT.   -Appreciate cardiology input and recommendations.  8.  Depression -Effexor.  9.  Vaginal candidiasis -Status post Diflucan 150 mg p.o. x1.   -Clotrimazole cream nightly.   DVT prophylaxis: SCDs Code Status: Full Family Communication: Updated patient and son at bedside.    Disposition:   Status is: Inpatient    Dispo: The patient is from: Home              Anticipated d/c is to: Home              Patient currently with improving epistaxis however some slight oozing of blood from left nare, acute blood loss anemia with hemoglobin trickling down.  Receiving transfusions.  Not stable for discharge.     Difficult to place patient no       Consultants:   ENT:  Dr. Elenore Rota 08/16/2020  Cardiology: Dr. Mariah Milling 08/16/2020  Procedures:   Clot removal from throat by EDMD Dr. York Cerise.  08/16/2020  Transfuse 1 unit packed red blood cells 08/17/2020  Transfused 2 units packed red blood cells pending 08/19/2020  Antimicrobials:   Augmentin 08/16/2020 >>>>>   Subjective: Sitting up in chair.  Noted to have some trickling of blood from left nare around 2 AM this morning.  No chest pain.  No shortness of breath.  No abdominal pain.  Lightheadedness/dizziness improving.   Objective: Vitals:   08/19/20 1544 08/19/20 1959 08/20/20 0401 08/20/20 0750  BP: (!) 148/85 124/83 (!) 151/76 125/75  Pulse: 87 98 95 84  Resp: 20 19 17 18   Temp: 98.3 F (36.8 C) 98.5 F (36.9 C) 98.2 F (36.8 C) 98.1 F (36.7 C)  TempSrc: Oral Oral  Oral  SpO2: 100% 96% 94% 97%  Weight:      Height:        Intake/Output Summary (Last 24 hours) at 08/20/2020 1129 Last data filed at 08/20/2020 0900 Gross per 24 hour  Intake 1144 ml  Output 1920 ml  Net -776 ml   10/20/2020  08/17/20 0500 08/18/20 0800 08/19/20 0624  Weight: 120.6 kg 121.2 kg 121.7 kg    Examination:  General exam: Left nare nasal packing soaked with blood and dried. HEENT: [place]/AT.  Oropharynx clear, no lesions, no exudates.  No blood noted in the posterior pharynx.  Respiratory system: CTA B.  No wheezes, no crackles, no rhonchi.  Normal respiratory effort.   Cardiovascular system: RRR no murmurs rubs or gallops.  No JVD.  No lower extremity edema.  Gastrointestinal system: Abdomen is soft, nontender, nondistended, positive bowel sounds.  No rebound.  No guarding.  Central nervous system: Alert and oriented. No focal neurological deficits. Extremities: Symmetric 5 x 5 power. Skin: No rashes, lesions or ulcers Psychiatry: Judgement and insight appear normal. Mood & affect appropriate.       Data Reviewed: I have personally reviewed following labs and imaging studies  CBC: Recent Labs  Lab  08/16/20 0241 08/16/20 0453 08/16/20 1023 08/17/20 0424 08/17/20 1935 08/18/20 0413 08/18/20 1451 08/19/20 0415 08/19/20 2039 08/20/20 0555  WBC 16.7* 15.4*  --  17.1*  --  12.9*  --  10.8*  --  10.2  NEUTROABS 13.9*  --   --  11.6*  --  8.8*  --  7.6  --  7.6  HGB 11.6* 11.3*   < > 8.3*   < > 8.5* 7.9* 7.7* 10.6* 10.5*  HCT 36.0 34.5*   < > 25.4*   < > 26.4* 24.4* 24.2* 32.1* 31.3*  MCV 92.1 91.5  --  91.7  --  94.3  --  93.4  --  89.4  PLT 320 330  --  273  --  215  --  204  --  224   < > = values in this interval not displayed.    Basic Metabolic Panel: Recent Labs  Lab 08/16/20 0453 08/17/20 0424 08/18/20 0413 08/19/20 0415 08/20/20 0555  NA 142 141 140 141 141  K 3.7 3.5 4.1 4.0 3.5  CL 110 109 110 111 107  CO2 23 25 24 25 26   GLUCOSE 158* 111* 99 104* 103*  BUN 34* 35* 16 9 8   CREATININE 0.60 0.82 0.79 0.62 0.70  CALCIUM 8.5* 8.3* 8.0* 8.3* 8.5*  MG  --  2.2  --   --  2.1    GFR: Estimated Creatinine Clearance: 97.6 mL/min (by C-G formula based on SCr of 0.7 mg/dL).  Liver Function Tests: No results for input(s): AST, ALT, ALKPHOS, BILITOT, PROT, ALBUMIN in the last 168 hours.  CBG: Recent Labs  Lab 08/19/20 0751 08/19/20 1151 08/19/20 1622 08/19/20 2048 08/20/20 0751  GLUCAP 93 111* 94 102* 105*     Recent Results (from the past 240 hour(s))  Resp Panel by RT-PCR (Flu A&B, Covid) Nasopharyngeal Swab     Status: None   Collection Time: 08/16/20  2:41 AM   Specimen: Nasopharyngeal Swab; Nasopharyngeal(NP) swabs in vial transport medium  Result Value Ref Range Status   SARS Coronavirus 2 by RT PCR NEGATIVE NEGATIVE Final    Comment: (NOTE) SARS-CoV-2 target nucleic acids are NOT DETECTED.  The SARS-CoV-2 RNA is generally detectable in upper respiratory specimens during the acute phase of infection. The lowest concentration of SARS-CoV-2 viral copies this assay can detect is 138 copies/mL. A negative result does not preclude  SARS-Cov-2 infection and should not be used as the sole basis for treatment or other patient management decisions. A negative result may occur with  improper specimen collection/handling, submission of specimen other than nasopharyngeal  swab, presence of viral mutation(s) within the areas targeted by this assay, and inadequate number of viral copies(<138 copies/mL). A negative result must be combined with clinical observations, patient history, and epidemiological information. The expected result is Negative.  Fact Sheet for Patients:  BloggerCourse.comhttps://www.fda.gov/media/152166/download  Fact Sheet for Healthcare Providers:  SeriousBroker.ithttps://www.fda.gov/media/152162/download  This test is no t yet approved or cleared by the [place] FDA and  has been authorized for detection and/or diagnosis of SARS-CoV-2 by FDA under an Emergency Use Authorization (EUA). This EUA will remain  in effect (meaning this test can be used) for the duration of the COVID-19 declaration under Section 564(b)(1) of the Act, 21 U.S.C.section 360bbb-3(b)(1), unless the authorization is terminated  or revoked sooner.       Influenza A by PCR NEGATIVE NEGATIVE Final   Influenza B by PCR NEGATIVE NEGATIVE Final    Comment: (NOTE) The Xpert Xpress SARS-CoV-2/FLU/RSV plus assay is intended as an aid in the diagnosis of influenza from Nasopharyngeal swab specimens and should not be used as a sole basis for treatment. Nasal washings and aspirates are unacceptable for Xpert Xpress SARS-CoV-2/FLU/RSV testing.  Fact Sheet for Patients: BloggerCourse.comhttps://www.fda.gov/media/152166/download  Fact Sheet for Healthcare Providers: SeriousBroker.ithttps://www.fda.gov/media/152162/download  This test is not yet approved or cleared by the [place] FDA and has been authorized for detection and/or diagnosis of SARS-CoV-2 by FDA under an Emergency Use Authorization (EUA). This EUA will remain in effect (meaning this test can be used) for the duration of  the COVID-19 declaration under Section 564(b)(1) of the Act, 21 U.S.C. section 360bbb-3(b)(1), unless the authorization is terminated or revoked.  Performed at [facility], [address] [place], [place] [address]          Radiology Studies: No results found.      Scheduled Meds: . amoxicillin-clavulanate  1 tablet Oral Q12H  . bacitracin   Topical BID  . clotrimazole  1 Applicatorful Vaginal QHS  . famotidine  20 mg Oral Daily  . insulin aspart  0-9 Units Subcutaneous TID PC & HS  . Lifitegrast  1 drop Both Eyes BID  . ramelteon  8 mg Oral QHS  . venlafaxine XR  150 mg Oral Q breakfast   Continuous Infusions:    LOS: 3 days    Time spent: 35 minutes     Ramiro Harvestaniel [name] Veach, MD [facility]   To contact the attending provider between 7A-7P or the covering provider during after hours 7P-7A, please log into the web site [email or web address] and access using universal [place] password for that web site. If you do not have the password, please call the hospital operator.  08/20/2020, 11:29 AM

## 2020-08-20 NOTE — Progress Notes (Signed)
08/20/2020 12:33 PM  Kendal Hymen [id number]  Post-Op Day #12    Temp:  [97.5 F (36.4 C)-99.2 F (37.3 C)] 97.5 F (36.4 C) (05/02 1209) Pulse Rate:  [84-98] 90 (05/02 1209) Resp:  [17-24] 18 (05/02 1209) BP: (123-151)/(63-85) 142/63 (05/02 1209) SpO2:  [94 %-100 %] 100 % (05/02 1209),     Intake/Output Summary (Last 24 hours) at 08/20/2020 1233 Last data filed at 08/20/2020 0900 Gross per 24 hour  Intake 1144 ml  Output 1920 ml  Net -776 ml    Results for orders placed or performed during the hospital encounter of 08/15/20 (from the past 24 hour(s))  Glucose, capillary     Status: None   Collection Time: 08/19/20  4:22 PM  Result Value Ref Range   Glucose-Capillary 94 70 - 99 mg/dL   Comment 1 Notify RN    Comment 2 Document in Chart   Hemoglobin and hematocrit, blood     Status: Abnormal   Collection Time: 08/19/20  8:39 PM  Result Value Ref Range   Hemoglobin 10.6 (L) 12.0 - 15.0 g/dL   HCT 44.0 (L) 10.2 - 72.5 %  Glucose, capillary     Status: Abnormal   Collection Time: 08/19/20  8:48 PM  Result Value Ref Range   Glucose-Capillary 102 (H) 70 - 99 mg/dL  CBC with Differential/Platelet     Status: Abnormal   Collection Time: 08/20/20  5:55 AM  Result Value Ref Range   WBC 10.2 4.0 - 10.5 K/uL   RBC 3.50 (L) 3.87 - 5.11 MIL/uL   Hemoglobin 10.5 (L) 12.0 - 15.0 g/dL   HCT 36.6 (L) 44.0 - 34.7 %   MCV 89.4 80.0 - 100.0 fL   MCH 30.0 26.0 - 34.0 pg   MCHC 33.5 30.0 - 36.0 g/dL   RDW 42.5 (H) 95.6 - 38.7 %   Platelets 224 150 - 400 K/uL   nRBC 0.3 (H) 0.0 - 0.2 %   Neutrophils Relative % 75 %   Neutro Abs 7.6 1.7 - 7.7 K/uL   Lymphocytes Relative 18 %   Lymphs Abs 1.9 0.7 - 4.0 K/uL   Monocytes Relative 5 %   Monocytes Absolute 0.5 0.1 - 1.0 K/uL   Eosinophils Relative 1 %   Eosinophils Absolute 0.1 0.0 - 0.5 K/uL   Basophils Relative 0 %   Basophils Absolute 0.0 0.0 - 0.1 K/uL   Immature Granulocytes 1 %   Abs Immature Granulocytes 0.09 (H) 0.00 -  0.07 K/uL  Basic metabolic panel     Status: Abnormal   Collection Time: 08/20/20  5:55 AM  Result Value Ref Range   Sodium 141 135 - 145 mmol/L   Potassium 3.5 3.5 - 5.1 mmol/L   Chloride 107 98 - 111 mmol/L   CO2 26 22 - 32 mmol/L   Glucose, Bld 103 (H) 70 - 99 mg/dL   BUN 8 8 - 23 mg/dL   Creatinine, Ser 5.64 0.44 - 1.00 mg/dL   Calcium 8.5 (L) 8.9 - 10.3 mg/dL   GFR, Estimated >33 >29 mL/min   Anion gap 8 5 - 15  Magnesium     Status: None   Collection Time: 08/20/20  5:55 AM  Result Value Ref Range   Magnesium 2.1 1.7 - 2.4 mg/dL  Glucose, capillary     Status: Abnormal   Collection Time: 08/20/20  7:51 AM  Result Value Ref Range   Glucose-Capillary 105 (H) 70 - 99 mg/dL  Glucose, capillary  Status: Abnormal   Collection Time: 08/20/20 12:09 PM  Result Value Ref Range   Glucose-Capillary 136 (H) 70 - 99 mg/dL    SUBJECTIVE: She is feeling fine with no signs of bleeding at all from her nose.  She had minimal serous ooze is coming from her packing as normal because of the nose trying to wash this out.  She has not been spitting up any blood.  She is having minimal discomfort in her nose.  She is feeling pretty good.  OBJECTIVE: Her hemoglobin is 10.6 this morning with a repeat at 10.5 this noon.  This seems to be stable after 2 units of blood yesterday.  She has no evidence of bleeding from her nose or throat nor does she have evidence of bleeding from her GU or GI tract.  She remains in sinus rhythm currently and has not had any evidence of atrial fibrillation only.  She has not restarted anticoagulants  IMPRESSION: She seems to be stable right now and has no evidence for active bleeding.  She does not have any bleeding from her nose.  She remains off of anticoagulants for now.  She is to be seen in the office in the next couple of days.  It was originally scheduled for tomorrow but were going to postpone this for couple days to keep the packing in longer and make sure she does  not have further bleeding.  PLAN: I believe she can be discharged home today to rest at home on a liquid and soft diet.  She will return to the office in a few days for packing removal.  She will remain off anticoagulants for now.  Cammy Copa 08/20/2020, 12:33 PM

## 2020-08-20 NOTE — Discharge Instructions (Signed)
Can be discharged home today to rest at home on a liquid and soft diet.  She will return to the office in a few days for packing removal.  She will remain off anticoagulants for now.

## 2020-08-21 ENCOUNTER — Telehealth: Payer: Self-pay

## 2020-08-21 NOTE — Telephone Encounter (Signed)
Transition Care Management Follow-up Telephone Call  Date of discharge and from where: 08/20/20 [facility]  How have you been since you were released from the hospital? Pt states she is doing fair; resting. Pt states she has not taken her rozerem in the past few days due to causing nightmare; pt would like to discuss alternative at next appt.   Any questions or concerns? No  Items Reviewed:  Did the pt receive and understand the discharge instructions provided? Yes   Medications obtained and verified? Yes   Other? No   Any new allergies since your discharge? No   Dietary orders reviewed? Yes  Do you have support at home? Yes   Home Care and Equipment/Supplies: Were home health services ordered? no Were any new equipment or medical supplies ordered?  No  Functional Questionnaire: (I = Independent and D = Dependent) ADLs: I  Bathing/Dressing- I  Meal Prep- I  Eating- I  Maintaining continence- I  Transferring/Ambulation- I  Managing Meds- I  Follow up appointments reviewed:   PCP Hospital f/u appt confirmed? No  pt has routine follow up for 5/23. Please advise if patient needs PCP hosp fu prior.   Specialist Hospital f/u appt confirmed? Yes  Scheduled to see cardiology on 08/30/20.  Are transportation arrangements needed? Yes   If their condition worsens, is the pt aware to call PCP or go to the Emergency Dept.? Yes  Was the patient provided with contact information for the PCP's office or ED? Yes  Was to pt encouraged to call back with questions or concerns? Yes

## 2020-08-24 ENCOUNTER — Other Ambulatory Visit: Payer: Self-pay

## 2020-08-24 ENCOUNTER — Other Ambulatory Visit
Admission: RE | Admit: 2020-08-24 | Discharge: 2020-08-24 | Disposition: A | Discharge status: Home / Self Care | Payer: BC Managed Care – PPO | Source: Ambulatory Visit | Attending: Otolaryngology | Admitting: Otolaryngology

## 2020-08-24 NOTE — Patient Instructions (Addendum)
Your procedure is scheduled on: Monday Aug 27, 2020. Report to Day Surgery inside [facility] 2nd floor (Stop by admissions desk first before getting on elevator). To find out your arrival time please call [phone] between 1PM - 3PM on Friday Aug 24, 2020.  Remember: Instructions that are not followed completely may result in serious medical risk,  up to and including death, or upon the discretion of your surgeon and anesthesiologist your  surgery may need to be rescheduled.     _X__ 1. Do not eat food after midnight the night before your procedure.                 No chewing gum or hard candies. You may drink clear liquids up to 2 hours                 before you are scheduled to arrive for your surgery- DO not drink clear                 liquids within 2 hours of the start of your surgery.                 Clear Liquids include:  water, apple juice without pulp, clear Gatorade, G2 or                  Gatorade Zero (avoid Red/Purple/Blue), Black Coffee or Tea (Do not add                 anything to coffee or tea).  __X__2.  On the morning of surgery brush your teeth with toothpaste and water, you                may rinse your mouth with mouthwash if you wish.  Do not swallow any toothpaste of mouthwash.     _X__ 3.  No Alcohol for 24 hours before or after surgery.   _X__ 4.  Do Not Smoke or use e-cigarettes For 24 Hours Prior to Your Surgery.                 Do not use any chewable tobacco products for at least 6 hours prior to                 Surgery.  _X__  5.  Do not use any recreational drugs (marijuana, cocaine, heroin, ecstasy, MDMA or other)                For at least one week prior to your surgery.  Combination of these drugs with anesthesia                May have life threatening results.  __X__6.  Notify your doctor if there is any change in your medical condition      (cold, fever, infections).     Do not wear jewelry, make-up, hairpins, clips  or nail polish. Do not wear lotions, powders, or perfumes. You may wear deodorant. Do not shave 48 hours prior to surgery. Men may shave face and neck. Do not bring valuables to the hospital.    [facility] is not responsible for any belongings or valuables.  Contacts, dentures or bridgework may not be worn into surgery. Leave your suitcase in the car. After surgery it may be brought to your room. For patients admitted to the hospital, discharge time is determined by your treatment team.   Patients discharged the day of surgery will not be allowed to drive home.  Make arrangements for someone to be with you for the first 24 hours of your Same Day Discharge.   __X__ Take these medicines the morning of surgery with A SIP OF WATER:    1. amLODipine (NORVASC) 5 MG   2. famotidine (PEPCID) 20 MG   3. metoprolol tartrate (LOPRESSOR) 100 MG   4. venlafaxine XR (EFFEXOR-XR) 150 MG  5. amoxicillin-clavulanate (AUGMENTIN) 875-125 MG  6.  ____ Fleet Enema (as directed)   ____ Use CHG Soap (or wipes) as directed  ____ Use Benzoyl Peroxide Gel as instructed  ____ Use inhalers on the day of surgery  ____ Stop metformin 2 days prior to surgery    ____ Take 1/2 of usual insulin dose the night before surgery. No insulin the morning          of surgery.   ____ Call your PCP, cardiologist, or Pulmonologist if taking Coumadin/Plavix/aspirin and ask when to stop before your surgery.   __X__ One Week prior to surgery- Stop Anti-inflammatories such as Ibuprofen, Aleve, Advil, Motrin, meloxicam (MOBIC), diclofenac, etodolac, ketorolac, Toradol, Daypro, piroxicam, Goody's or BC powders. OK TO USE TYLENOL IF NEEDED   __X__ Stop supplements until after surgery. Vitamin D, Ergocalciferol, (DRISDOL)    ____ Bring C-Pap to the hospital.    If you have any questions regarding your pre-procedure instructions,  Please call Pre-admit Testing at [phone].

## 2020-08-24 NOTE — Progress Notes (Signed)
Perioperative Services Pre-Admission/Anesthesia Testing    Date: 08/24/20  Name: Regina Christensen MRN:   [id number]  Re: Clearance for surgery   Case: [id number] Date/Time: 08/27/20 0730   Procedures:      EXAM UNDER ANESTHESIA (N/A )     SINUS LAVAGE (N/A )   Anesthesia type: Choice   Pre-op diagnosis: Exam Sinus Under Anesthesia   Location: [facility] OR ROOM 09 / [facility]   Surgeons: Vernie Murders, MD    Patient scheduled for the above procedure on 08/27/2020 with Dr. Vernie Murders. Patient recently reviewed by PAT APP prior to sinus surgery on 08/08/2020; see my note dated 08/06/2020.  Interval history reviewed. Patient presented to the ED on POD-7 for evaluation of uncontrolled epistaxis event from the LEFT naris. At the time she was seen, she had been bleeding continuously x 2 hours. Of note, patient on daily apixaban therapy. Apixaban was held 2 days preoperatively with 2 day enoxaparin bridge. She resumed her normal apixaban dosing on POD-1. Oxymetazoline and TXA administered with little to no effect. Merocel sponge was placed, however patient continued to bleed. She was admitted for observation and further management by ENT. Hgb trended down from 14 to 7.7 g/dL; received a total of 3 units of PRBCs.  Cardiology was consulted during admission regarding CVA risk with patient being off of chronic anticoagulation. Per Brion Aliment, NP, "from a cardiology standpoint, recurrent bleeding risk far outweighs stroke risk at this time, thus it is okay to hold Eliquis as long as deemed necessary by ENT team".  Patient stabilized and was ultimately discharged home on 08/20/2020 in stable condition.   UPDATED WORK UP:   LABS: Reviewed as stable for surgery Lab Results  Component Value Date   WBC 10.2 08/20/2020   HGB 10.5 (L) 08/20/2020   HCT 31.3 (L) 08/20/2020   MCV 89.4 08/20/2020   PLT 224 08/20/2020   Lab Results  Component Value Date   NA 141 08/20/2020   K 3.5 08/20/2020    CO2 26 08/20/2020   BUN 8 08/20/2020   CREATININE 0.70 08/20/2020   CALCIUM 8.5 (L) 08/20/2020   GLUCOSE 103 (H) 08/20/2020   EKG: Date: 08/16/2020 Time ECG obtained: 0300 AM Rate: 101 bpm Rhythm: sinus tachycardia; LVH Intervals: PR 129 ms, QRS 90 ms, QTc 413 ms.  ST segment and T wave changes: Non-specific T wave abnormalities.   Impression and Plan:  Regina Christensen has been referred for pre-anesthesia review and clearance prior to her undergoing the planned anesthetic and procedural courses. Available labs, pertinent testing, and imaging results were personally reviewed by me. This patient was appropriately cleared by cardiology for sinus surgery with an overall ACCEPTABLE risk of significant perioperative cardiovascular complications.  Based on clinical review performed today (08/24/20), barring any significant acute changes in the patient's overall condition, it is anticipated that she will be able to proceed with the planned surgical intervention. Any acute changes in clinical condition may necessitate her procedure being postponed and/or cancelled. Patient will meet with anesthesia team (MD and/or CRNA) on this day of her procedure for preoperative evaluation/assessment.   Pre-surgical instructions were reviewed with the patient during her PAT appointment and questions were fielded by PAT clinical staff. Patient was advised that if any questions or concerns arise prior to her procedure then she should return a call to PAT and/or her surgeon's office to discuss.  Quentin Mulling, MSN, APRN, FNP-C, CEN [place] [facility]  Peri-operative Services Nurse Practitioner  Phone: [phone] 08/24/20 10:56 AM  NOTE: This note has been prepared using Scientist, clinical (histocompatibility and immunogenetics). Despite my best ability to proofread, there is always the potential that unintentional transcriptional errors may still occur from this process.

## 2020-08-27 ENCOUNTER — Ambulatory Visit: Payer: BC Managed Care – PPO | Admitting: Urgent Care

## 2020-08-27 ENCOUNTER — Encounter: Payer: Self-pay | Admitting: Otolaryngology

## 2020-08-27 ENCOUNTER — Ambulatory Visit
Admission: RE | Admit: 2020-08-27 | Discharge: 2020-08-27 | Disposition: A | Discharge status: Home / Self Care | Payer: BC Managed Care – PPO | Source: Ambulatory Visit | Attending: Otolaryngology | Admitting: Otolaryngology

## 2020-08-27 ENCOUNTER — Encounter
Admission: RE | Disposition: A | Discharge status: Home / Self Care | Payer: Self-pay | Source: Ambulatory Visit | Attending: Otolaryngology

## 2020-08-27 DIAGNOSIS — Z9889 Other specified postprocedural states: Secondary | ICD-10-CM | POA: Insufficient documentation

## 2020-08-27 DIAGNOSIS — Z8673 Personal history of transient ischemic attack (TIA), and cerebral infarction without residual deficits: Secondary | ICD-10-CM | POA: Diagnosis not present

## 2020-08-27 DIAGNOSIS — J3489 Other specified disorders of nose and nasal sinuses: Secondary | ICD-10-CM | POA: Diagnosis not present

## 2020-08-27 DIAGNOSIS — J329 Chronic sinusitis, unspecified: Secondary | ICD-10-CM | POA: Diagnosis not present

## 2020-08-27 DIAGNOSIS — Z79899 Other long term (current) drug therapy: Secondary | ICD-10-CM | POA: Diagnosis not present

## 2020-08-27 DIAGNOSIS — Z7901 Long term (current) use of anticoagulants: Secondary | ICD-10-CM | POA: Insufficient documentation

## 2020-08-27 HISTORY — PX: NASAL TURBINATE REDUCTION: SHX2072

## 2020-08-27 LAB — GLUCOSE, CAPILLARY
Glucose-Capillary: 96 mg/dL (ref 70–99)
Glucose-Capillary: 90 mg/dL (ref 70–99)
Glucose-Capillary: 159 mg/dL — ABNORMAL HIGH (ref 70–99)

## 2020-08-27 SURGERY — EXAM UNDER ANESTHESIA
Anesthesia: General

## 2020-08-27 MED ORDER — HYDROMORPHONE HCL 1 MG/ML IJ SOLN
INTRAMUSCULAR | Status: AC
  Filled 2020-08-27: qty 1

## 2020-08-27 MED ORDER — ONDANSETRON HCL 4 MG/2ML IJ SOLN: 4.0000 mg | Freq: Once | INTRAMUSCULAR | Status: DC | PRN

## 2020-08-27 MED ORDER — TRAMADOL HCL 50 MG PO TABS
ORAL_TABLET | ORAL | Status: AC
  Filled 2020-08-27: qty 1

## 2020-08-27 MED ORDER — ONDANSETRON HCL 4 MG/2ML IJ SOLN
INTRAMUSCULAR | Status: DC | PRN
  Administered 2020-08-27: 4 mg via INTRAVENOUS

## 2020-08-27 MED ORDER — ACETAMINOPHEN 500 MG PO TABS
1000.0000 mg | ORAL_TABLET | Freq: Once | ORAL | Status: AC
  Administered 2020-08-27: 1000 mg via ORAL

## 2020-08-27 MED ORDER — FENTANYL CITRATE (PF) 100 MCG/2ML IJ SOLN
INTRAMUSCULAR | Status: DC | PRN
  Administered 2020-08-27: 50 ug via INTRAVENOUS

## 2020-08-27 MED ORDER — TRAMADOL HCL 50 MG PO TABS
50.0000 mg | ORAL_TABLET | Freq: Once | ORAL | Status: AC
  Administered 2020-08-27: 50 mg via ORAL

## 2020-08-27 MED ORDER — PHENYLEPHRINE HCL 10 % OP SOLN
Freq: Once | OPHTHALMIC | Status: DC
  Filled 2020-08-27: qty 10

## 2020-08-27 MED ORDER — FENTANYL CITRATE (PF) 100 MCG/2ML IJ SOLN: 25.0000 ug | INTRAMUSCULAR | Status: DC | PRN

## 2020-08-27 MED ORDER — SUCCINYLCHOLINE CHLORIDE 20 MG/ML IJ SOLN
INTRAMUSCULAR | Status: DC | PRN
  Administered 2020-08-27: 120 mg via INTRAVENOUS

## 2020-08-27 MED ORDER — PROPOFOL 10 MG/ML IV BOLUS
INTRAVENOUS | Status: AC
  Filled 2020-08-27: qty 20

## 2020-08-27 MED ORDER — PROPOFOL 10 MG/ML IV BOLUS
INTRAVENOUS | Status: DC | PRN
  Administered 2020-08-27: 150 mg via INTRAVENOUS

## 2020-08-27 MED ORDER — LABETALOL HCL 5 MG/ML IV SOLN
INTRAVENOUS | Status: DC | PRN
  Administered 2020-08-27: 5 mg via INTRAVENOUS

## 2020-08-27 MED ORDER — LIDOCAINE HCL (CARDIAC) PF 100 MG/5ML IV SOSY
PREFILLED_SYRINGE | INTRAVENOUS | Status: DC | PRN
  Administered 2020-08-27: 100 mg via INTRAVENOUS

## 2020-08-27 MED ORDER — LACTATED RINGERS IV SOLN: INTRAVENOUS | Status: DC | PRN

## 2020-08-27 MED ORDER — CHLORHEXIDINE GLUCONATE 0.12 % MT SOLN
OROMUCOSAL | Status: AC
  Filled 2020-08-27: qty 15

## 2020-08-27 MED ORDER — ROCURONIUM BROMIDE 100 MG/10ML IV SOLN
INTRAVENOUS | Status: DC | PRN
  Administered 2020-08-27: 10 mg via INTRAVENOUS

## 2020-08-27 MED ORDER — METHYLPREDNISOLONE ACETATE 40 MG/ML IJ SUSP
INTRAMUSCULAR | Status: AC
  Filled 2020-08-27: qty 1

## 2020-08-27 MED ORDER — LIDOCAINE-EPINEPHRINE (PF) 1 %-1:200000 IJ SOLN
INTRAMUSCULAR | Status: DC | PRN
  Administered 2020-08-27: 2.5 mL

## 2020-08-27 MED ORDER — SUGAMMADEX SODIUM 200 MG/2ML IV SOLN
INTRAVENOUS | Status: DC | PRN
  Administered 2020-08-27: 200 mg via INTRAVENOUS

## 2020-08-27 MED ORDER — EPHEDRINE SULFATE 50 MG/ML IJ SOLN
INTRAMUSCULAR | Status: DC | PRN
  Administered 2020-08-27 (×2): 10 mg via INTRAVENOUS

## 2020-08-27 MED ORDER — ACETAMINOPHEN 500 MG PO TABS
ORAL_TABLET | ORAL | Status: AC
  Filled 2020-08-27: qty 2

## 2020-08-27 MED ORDER — ORAL CARE MOUTH RINSE: 15.0000 mL | Freq: Once | OROMUCOSAL | Status: AC

## 2020-08-27 MED ORDER — PHENYLEPHRINE HCL 10 % OP SOLN
OPHTHALMIC | Status: DC | PRN
  Administered 2020-08-27: 10 mL via TOPICAL

## 2020-08-27 MED ORDER — FENTANYL CITRATE (PF) 100 MCG/2ML IJ SOLN
INTRAMUSCULAR | Status: AC
  Filled 2020-08-27: qty 2

## 2020-08-27 MED ORDER — SODIUM CHLORIDE 0.9 % IV SOLN: INTRAVENOUS | Status: DC

## 2020-08-27 MED ORDER — CHLORHEXIDINE GLUCONATE 0.12 % MT SOLN
15.0000 mL | Freq: Once | OROMUCOSAL | Status: AC
  Administered 2020-08-27: 15 mL via OROMUCOSAL

## 2020-08-27 MED ORDER — DEXAMETHASONE SODIUM PHOSPHATE 10 MG/ML IJ SOLN
INTRAMUSCULAR | Status: DC | PRN
  Administered 2020-08-27: 10 mg via INTRAVENOUS

## 2020-08-27 SURGICAL SUPPLY — 42 items
BATTERY INSTRU NAVIGATION (MISCELLANEOUS) ×4 IMPLANT
BLADE SURG 15 STRL LF DISP TIS (BLADE) ×2 IMPLANT
BLADE SURG 15 STRL SS (BLADE) ×3
BTRY SRG DRVR LF (MISCELLANEOUS)
CANISTER SUCT 1200ML W/VALVE (MISCELLANEOUS) ×2 IMPLANT
CNTNR SPEC 2.5X3XGRAD LEK (MISCELLANEOUS) ×2
COAG SUCT 10F 3.5MM HAND CTRL (MISCELLANEOUS) ×3 IMPLANT
CONT SPEC 4OZ STER OR WHT (MISCELLANEOUS) ×1
CONT SPEC 4OZ STRL OR WHT (MISCELLANEOUS) ×2
CONTAINER SPEC 2.5X3XGRAD LEK (MISCELLANEOUS) ×4 IMPLANT
COVER WAND RF STERILE (DRAPES) ×3 IMPLANT
DEFOGGER ANTIFOG KIT (MISCELLANEOUS) ×3 IMPLANT
DRESSING NASL FOAM PST OP SINU (MISCELLANEOUS) IMPLANT
DRSG NASAL FOAM POST OP SINU (MISCELLANEOUS)
ELECT REM PT RETURN 9FT ADLT (ELECTROSURGICAL) ×3
ELECTRODE REM PT RTRN 9FT ADLT (ELECTROSURGICAL) ×2 IMPLANT
GLOVE PROTEXIS LATEX SZ 7.5 (GLOVE) ×6 IMPLANT
GLOVE SURG LATEX 7.5 PF (GLOVE) ×4 IMPLANT
GOWN STRL REUS W/ TWL LRG LVL3 (GOWN DISPOSABLE) ×4 IMPLANT
GOWN STRL REUS W/TWL LRG LVL3 (GOWN DISPOSABLE) ×6
IV NS 500ML (IV SOLUTION)
IV NS 500ML BAXH (IV SOLUTION) ×2 IMPLANT
MANIFOLD NEPTUNE II (INSTRUMENTS) ×3 IMPLANT
NDL ANESTHESIA 27G X 3.5 (NEEDLE) ×2 IMPLANT
NEEDLE ANESTHESIA  27G X 3.5 (NEEDLE) ×3
NEEDLE ANESTHESIA 27G X 3.5 (NEEDLE) ×2 IMPLANT
NS IRRIG 500ML POUR BTL (IV SOLUTION) ×3 IMPLANT
PACK HEAD/NECK (MISCELLANEOUS) ×3 IMPLANT
PACKING NASAL EPIS 4X2.4 XEROG (MISCELLANEOUS) ×4 IMPLANT
PATTIES SURGICAL .5 X3 (DISPOSABLE) ×3 IMPLANT
SHAVER DIEGO BLD STD TYPE A (BLADE) ×2 IMPLANT
SUT CHROMIC 3-0 (SUTURE)
SUT CHROMIC 3-0 KS 27XMFL CR (SUTURE)
SUT ETHILON 3-0 KS 30 BLK (SUTURE) ×2 IMPLANT
SUT PLAIN GUT 4-0 (SUTURE) ×2 IMPLANT
SUTURE CHRMC 3-0 KS 27XMFL CR (SUTURE) ×2 IMPLANT
SWAB CULTURE AMIES ANAERIB BLU (MISCELLANEOUS) ×2 IMPLANT
SYR 20ML LL LF (SYRINGE) ×3 IMPLANT
SYR 3ML LL SCALE MARK (SYRINGE) ×3 IMPLANT
TRACKER CRANIALMASK (MASK) ×2 IMPLANT
TUBING DECLOG MULTIDEBRIDER (TUBING) ×2 IMPLANT
WATER STERILE IRR 1000ML POUR (IV SOLUTION) IMPLANT

## 2020-08-27 NOTE — Anesthesia Preprocedure Evaluation (Addendum)
Anesthesia Evaluation  Patient identified by MRN, date of birth, ID band Patient awake    Reviewed: Allergy & Precautions, NPO status , Patient's Chart, lab work & pertinent test results  History of Anesthesia Complications (+) history of anesthetic complications (post-op HA)  Airway Mallampati: II  TM Distance: >3 FB     Dental  (+) Caps, Teeth Intact   Pulmonary sleep apnea (not using CPAP often) and Continuous Positive Airway Pressure Ventilation , neg COPD, Not current smoker,    Pulmonary exam normal        Cardiovascular hypertension, Pt. on medications (-) Past MI and (-) CHF Normal cardiovascular exam(-) dysrhythmias (-) Valvular Problems/Murmurs     Neuro/Psych  Headaches, neg Seizures PSYCHIATRIC DISORDERS Anxiety Depression CVA (found on MRI), No Residual Symptoms    GI/Hepatic Neg liver ROS, GERD  Medicated and Controlled,  Endo/Other  diabetes, Type obesity  Renal/GU negative Renal ROS  negative genitourinary   Musculoskeletal  (+) Arthritis , Osteoarthritis,    Abdominal Normal abdominal exam  (+)   Peds  Hematology  (+) anemia ,   Anesthesia Other Findings Past Medical History: No date: Anxiety No date: Arthritis No date: Back pain No date: BPPV (benign paroxysmal positional vertigo) No date: Chronic diastolic HF (heart failure) (HCC) No date: Complication of anesthesia     Comment:  Post-operative hypoxia requiring supplemental oxygen No date: Current use of long term anticoagulation     Comment:  Apixaban 02/28/2020: CVA (cerebral vascular accident) (HCC)     Comment:  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; imaging from               [facility] No date: Depression No date: Edema of both lower extremities No date: GERD (gastroesophageal reflux disease) No date: Hypertension No date: IBS (irritable bowel syndrome) No date: Lactose intolerance No date: Morbid obesity with BMI of  45.0-49.9, adult (HCC) No date: Obesity No date: Osteoarthritis No date: PAF (paroxysmal atrial fibrillation) (HCC) No date: Pre-diabetes No date: Sleep apnea     Comment:  uses CPAP machine sometimes  No date: Type 2 diabetes mellitus without complication (HCC) No date: Vitamin D deficiency  Reproductive/Obstetrics                            Anesthesia Physical  Anesthesia Plan  ASA: III  Anesthesia Plan: General   Post-op Pain Management:    Induction: Intravenous  PONV Risk Score and Plan: 3 and Ondansetron, Dexamethasone and Treatment may vary due to age or medical condition  Airway Management Planned: Oral ETT  Additional Equipment:   Intra-op Plan:   Post-operative Plan:   Informed Consent: I have reviewed the patients History and Physical, chart, labs and discussed the procedure including the risks, benefits and alternatives for the proposed anesthesia with the patient or authorized representative who has indicated his/her understanding and acceptance.       Plan Discussed with:   Anesthesia Plan Comments:         Anesthesia Quick Evaluation

## 2020-08-27 NOTE — Transfer of Care (Signed)
Immediate Anesthesia Transfer of Care Note  Patient: Regina Christensen  Procedure(s) Performed: SINUS EXAM UNDER ANESTHESIA (N/A ) TURBINATE REDUCTION /SUBMUCOSAL DEBRIDEMENT  Patient Location: PACU  Anesthesia Type:General  Level of Consciousness: awake and sedated  Airway & Oxygen Therapy: Patient Spontanous Breathing and Patient connected to face mask oxygen  Post-op Assessment: Report given to RN and Post -op Vital signs reviewed and stable  Post vital signs: Reviewed and stable  Last Vitals:  Vitals Value Taken Time  BP 188/88 08/27/20 0854  Temp 36.8 C 08/27/20 0854  Pulse 81 08/27/20 0859  Resp 19 08/27/20 0859  SpO2 100 % 08/27/20 0859  Vitals shown include unvalidated device data.  Last Pain:  Vitals:   08/27/20 0854  TempSrc:   PainSc: 0-No pain         Complications: No complications documented.

## 2020-08-27 NOTE — H&P (Signed)
H&P has been reviewed and patient reevaluated, no changes necessary. To be downloaded later.  

## 2020-08-27 NOTE — Anesthesia Procedure Notes (Signed)
Procedure Name: Intubation Performed by: Joanette Gula, Sache Sane, CRNA Pre-anesthesia Checklist: Patient identified, Emergency Drugs available, Suction available and Patient being monitored Patient Re-evaluated:Patient Re-evaluated prior to induction Oxygen Delivery Method: Circle system utilized Preoxygenation: Pre-oxygenation with 100% oxygen Induction Type: IV induction Ventilation: Mask ventilation without difficulty Laryngoscope Size: McGraph and 3 Tube type: Oral Tube size: 7.0 mm Number of attempts: 1 Airway Equipment and Method: Stylet and Oral airway Placement Confirmation: ETT inserted through vocal cords under direct vision,  positive ETCO2 and breath sounds checked- equal and bilateral Tube secured with: Tape Dental Injury: Teeth and Oropharynx as per pre-operative assessment

## 2020-08-27 NOTE — Op Note (Signed)
08/27/2020  8:59 AM    Regina Christensen  [id number]   Pre-Op Dx: Chronic sinusitis, lateralized left middle turbinate post nasal packing for epistaxis  Post-op Dx: Same  Proc: Bilateral rigid sinus endoscopy with medialization of the left middle turbinate and debridement of scar tissue, mucus, and old blood from sinuses  Surg:  Regina Christensen  Anes:  GOT  EBL: Minimal  Comp: None  Findings: The left middle turbinate was lateralized and scarring to the lateral wall.  There was old blood mucus that was hidden there that could not drain.  There is old blood filling much of the maxillary sinus.   The right side had some thick mucus and scar tissue in the upper anterior ethmoid area that was cleaned out.  The right maxillary sinus was fairly clear.  Procedure: Patient was brought to the operating room and given general anesthesia by oral endotracheal intubation.  Nose was prepped with cotton pledgets using phenylephrine and Xylocaine.  She was prepped and draped in sterile fashion.  The 0 degree scope was used to visualize the right side first.  There is some mucus in the anterior ethmoid and a scar band was found anteriorly superiorly in the ethmoid.  This was removed.  There is no significant bleeding or old blood in the area.  Once this was cleaned out the anterior ethmoid and frontal sinus duct was easily seen with a 30 degree scope.  Maxillary sinus was visualized with a 30 degree scope and was clear.  The left side visualized and 1 mL of 1% Xylocaine with epi 1: 100,000 was used for infiltration of the anterior root of the middle turbinate.  Another milliliter was put into the posterior root of the middle turbinate.  The middle turbinate was then infractured.  It was scarring to the lateral wall and had to break these scar bands.  The anterior ethmoid was debrided of all scar tissue, and behind it was some old blood and thick mucus that was all cleaned out as well.  The ethmoid was wide  open now. The frontal sinus ducts were open and clear.  The left maxillary sinus was filled with old blood and this was all suctioned clear using the 30 degree scope and curved suctions.  Both sides were now completely cleaned out and showed good anatomy.  Xerogel was then placed into the left ethmoid sinus to help hold the left middle turbinate medialized.  The right side had some xerogel placed in the anterior ethmoid area to make sure there is no further scar tissue developing here.  Patient tolerated the procedure well.  She was awakened and taken to the recovery room in satisfactory condition.  There were no operative complications.  Dispo:   To PACU to be discharged home  Plan: To follow-up in the office in 1 week for reevaluation.  She will use saline flushes twice a day to help wash out the xerogel.  She will remain off of her blood thinners as she is continually in sinus rhythm currently.  She will get her blood pressure checked regularly at home and if it should start elevated she will restart on her blood pressure medications.  She will use Tylenol for pain but does not need any narcotics and should not take any aspirin products.  She will call the office and be seen sooner if any problems  Cammy Copa  08/27/2020 8:59 AM

## 2020-08-27 NOTE — Progress Notes (Signed)
Patient states that she would accept blood if she needed it. Witnessed by this Charity fundraiser and the [employer].

## 2020-08-27 NOTE — Anesthesia Postprocedure Evaluation (Signed)
Anesthesia Post Note  Patient: OVIE EASTEP  Procedure(s) Performed: SINUS EXAM UNDER ANESTHESIA (N/A ) TURBINATE REDUCTION /SUBMUCOSAL DEBRIDEMENT  Patient location during evaluation: PACU Anesthesia Type: General Level of consciousness: awake and alert and oriented Pain management: pain level controlled Vital Signs Assessment: post-procedure vital signs reviewed and stable Respiratory status: spontaneous breathing Cardiovascular status: blood pressure returned to baseline Anesthetic complications: no   No complications documented.   Last Vitals:  Vitals:   08/27/20 1054 08/27/20 1105  BP: (!) 153/68 (!) 152/67  Pulse: 76 76  Resp: 14 16  Temp:  (!) 36.2 C  SpO2: 100% 100%    Last Pain:  Vitals:   08/27/20 1105  TempSrc: Temporal  PainSc: 6                  Abiel Antrim

## 2020-08-27 NOTE — Discharge Instructions (Signed)

## 2020-08-28 ENCOUNTER — Encounter: Payer: Self-pay | Admitting: Otolaryngology

## 2020-08-28 ENCOUNTER — Ambulatory Visit: Payer: BC Managed Care – PPO | Admitting: Psychiatry

## 2020-08-30 ENCOUNTER — Encounter: Payer: Self-pay | Admitting: Medical

## 2020-08-30 ENCOUNTER — Other Ambulatory Visit: Payer: Self-pay

## 2020-08-30 ENCOUNTER — Ambulatory Visit: Payer: BC Managed Care – PPO | Admitting: Medical

## 2020-08-30 VITALS — BP 148/98 | HR 75 | Ht 68.0 in | Wt 261.0 lb

## 2020-08-30 DIAGNOSIS — D62 Acute posthemorrhagic anemia: Secondary | ICD-10-CM | POA: Diagnosis not present

## 2020-08-30 DIAGNOSIS — I4891 Unspecified atrial fibrillation: Secondary | ICD-10-CM

## 2020-08-30 DIAGNOSIS — Z8673 Personal history of transient ischemic attack (TIA), and cerebral infarction without residual deficits: Secondary | ICD-10-CM

## 2020-08-30 DIAGNOSIS — I10 Essential (primary) hypertension: Secondary | ICD-10-CM | POA: Diagnosis not present

## 2020-08-30 DIAGNOSIS — D649 Anemia, unspecified: Secondary | ICD-10-CM

## 2020-08-30 MED ORDER — AMLODIPINE BESYLATE 10 MG PO TABS: 10.0000 mg | ORAL_TABLET | Freq: Every day | ORAL | 1 refills | Status: DC

## 2020-08-30 NOTE — Patient Instructions (Signed)
Medication Instructions:  Your physician has recommended you make the following change in your medication:   INCREASE Amlodipine 10mg  daily  *If you need a refill on your cardiac medications before your next appointment, please call your pharmacy*   Lab Work: Your physician recommends that you have lab work TODAY: CBC  If you have labs (blood work) drawn today and your tests are completely normal, you will receive your results only by: MyChart Message (if you have MyChart) OR . A paper copy in the mail If you have any lab test that is abnormal or we need to change your treatment, we will call you to review the results.   Testing/Procedures: None ordered   Follow-Up: At [facility], you and your health needs are our priority.  As part of our continuing mission to provide you with exceptional heart care, we have created designated Provider Care Teams.  These Care Teams include your primary Cardiologist (physician) and Advanced Practice Providers (APPs -  Physician Assistants and Nurse Practitioners) who all work together to provide you with the care you need, when you need it.  We recommend signing up for the patient portal called "MyChart".  Sign up information is provided on this After Visit Summary.  MyChart is used to connect with patients for Virtual Visits (Telemedicine).  Patients are able to view lab/test results, encounter notes, upcoming appointments, etc.  Non-urgent messages can be sent to your provider as well.   To learn more about what you can do with MyChart, go to [facility].    Your next appointment:   2 week(s)  The format for your next appointment:   In Person  Provider:   You may see [email or web address], MD or one of the following Advanced Practice Providers on your designated Care Team:     Cadence Cricket, [place]

## 2020-08-30 NOTE — Progress Notes (Signed)
Cardiology Office Note:    Date:  08/30/2020   ID:  Regina Christensen, DOB 12/25/1955, MRN [id number]  PCP:  Alba CorySowles, Krichna, MD  [facility] Cardiologist:  Debbe OdeaBrian Agbor-Etang, MD  [facility] Electrophysiologist:  None   Referring MD: Alba CorySowles, Krichna, MD   Chief Complaint: Hospital follow-up  History of Present Illness:    Regina QuaJosephine S Aybar is a 65 y.o. female with a hx of hypertension, vitamin D deficiency, prediabetes, obesity status post sleeve gastrectomy, PAF on Eliquis, obesity, sleep apnea, CVA November 2021 in the setting of Eliquis noncompliance who presents for hospital follow-up.  Patient underwent sinus surgery with bilateral endoscopic maxillary and hysterostomy's, right endoscopic total ethmoidectomy with frontal sinusotomy and left endoscopic partial ethmoidectomy and endoscopic trimming of both concha bullosa of the middle turbinates.  Patient was bridged with Lovenox for 2 days prior to surgery.  She resumed Eliquis 4/21.  Patient was admitted 4/27 - 5/2 for acute onset persistent left-sided epistaxis but no recent trauma.  Hemoglobin was down to 7.7 and she was given 3 units PRBCs. Eliquis was held. Discharge hemoglobin 11.6.  On 08/27/2020 the patient underwent bilateral rigid sinus endoscopy with medialization of the left turbinate and debridement of scar tissue, mucus, or old blood from sinuses.  She remained in sinus rhythm.  Plan to remain off blood thinners. She has 1 week follow-up in the ENT office.  Today, the patient says she has been doing ok, just tired. Eating and drinking ok. She remains off Eliquis. She has follow-up with ENT on Monday. No more nose bleeds. She is in SR with PACs today. No chest pain, sob, LLE, orthopnea, pnd. BP mildly up. She takes 3 antihypertensives, took them this AM. Hasn't checked BP this AM. AT home it's been 140/90. Needs to compare home bp cuff with here. Hemoglobin 10.5 on 5/22. Re-check cbc.  Past Medical History:  Diagnosis  Date  . Anxiety   . Arthritis   . Back pain   . BPPV (benign paroxysmal positional vertigo)   . Chronic diastolic HF (heart failure) (HCC)   . Complication of anesthesia    Post-operative hypoxia requiring supplemental oxygen  . Current use of long term anticoagulation    Apixaban  . CVA (cerebral vascular accident) (HCC) 02/28/2020   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; imaging from [facility]  . Depression   . Edema of both lower extremities   . GERD (gastroesophageal reflux disease)   . Hypertension   . IBS (irritable bowel syndrome)   . Lactose intolerance   . Morbid obesity with BMI of 45.0-49.9, adult (HCC)   . Obesity   . Osteoarthritis   . PAF (paroxysmal atrial fibrillation) (HCC)   . Pre-diabetes   . Sleep apnea    uses CPAP machine sometimes   . Type 2 diabetes mellitus without complication (HCC)   . Vitamin D deficiency     Past Surgical History:  Procedure Laterality Date  . ABDOMINAL HYSTERECTOMY    . BREAST EXCISIONAL BIOPSY Left   . ENDOMETRIAL ABLATION     x2  . ENDOSCOPIC CONCHA BULLOSA RESECTION Bilateral 08/08/2020   Procedure: ENDOSCOPIC CONCHA BULLOSA RESECTION;  Surgeon: Vernie MurdersJuengel, Paul, MD;  Location: [facility];  Service: ENT;  Laterality: Bilateral;  . ETHMOIDECTOMY Right 08/08/2020   Procedure: ETHMOIDECTOMY, LEFT PARTIAL ETHMOIDECTOMY;  Surgeon: Vernie MurdersJuengel, Paul, MD;  Location: [facility];  Service: ENT;  Laterality: Right;  . IMAGE GUIDED SINUS SURGERY N/A 08/08/2020   Procedure: IMAGE GUIDED SINUS  SURGERY, RIGHT FRONTAL SINUSOTOMY;  Surgeon: Vernie Murders, MD;  Location: [facility];  Service: ENT;  Laterality: N/A;  . LAPAROSCOPIC SLEEVE GASTRECTOMY    . MAXILLARY ANTROSTOMY Bilateral 08/08/2020   Procedure: MAXILLARY ANTROSTOMY with tissue;  Surgeon: Vernie Murders, MD;  Location: [facility];  Service: ENT;  Laterality: Bilateral;  . NASAL TURBINATE REDUCTION  08/27/2020   Procedure: TURBINATE REDUCTION /SUBMUCOSAL DEBRIDEMENT;  Surgeon: Vernie Murders, MD;   Location: [facility];  Service: ENT;;  . TOTAL KNEE ARTHROPLASTY Right     Current Medications: Current Meds  Medication Sig  . acetaminophen (TYLENOL) 650 MG CR tablet Take 1 tablet (650 mg total) by mouth every 8 (eight) hours as needed for pain.  Marland Kitchen amLODipine (NORVASC) 10 MG tablet Take 1 tablet (10 mg total) by mouth daily.  . famotidine (PEPCID) 20 MG tablet Take 1 tablet (20 mg total) by mouth daily.  . meclizine (ANTIVERT) 12.5 MG tablet Take 1 tablet (12.5 mg total) by mouth 3 (three) times daily as needed for dizziness.  . metoprolol tartrate (LOPRESSOR) 100 MG tablet Take 1 tablet (100 mg total) by mouth 2 (two) times daily.  Marland Kitchen olmesartan (BENICAR) 40 MG tablet Take 1 tablet (40 mg total) by mouth daily.  . Semaglutide, 1 MG/DOSE, (OZEMPIC, 1 MG/DOSE,) 2 MG/1.5ML SOPN Inject 1 mg into the skin once a week. Sunday  . triamcinolone (NASACORT) 55 MCG/ACT AERO nasal inhaler Place 2 sprays into the nose daily.  Marland Kitchen venlafaxine XR (EFFEXOR-XR) 150 MG 24 hr capsule TAKE 1 CAPSULE DAILY WITH BREAKFAST  . Vitamin D, Ergocalciferol, (DRISDOL) 1.25 MG (50000 UNIT) CAPS capsule Take 1 capsule (50,000 Units total) by mouth every 7 (seven) days.  Marland Kitchen XIIDRA 5 % SOLN Place 1 drop into both eyes 2 (two) times daily.  . [DISCONTINUED] amLODipine (NORVASC) 5 MG tablet Take 1 tablet (5 mg total) by mouth daily.     Allergies:   Hydrocodone-acetaminophen, Ace inhibitors, and Hctz [hydrochlorothiazide]   Social History   Socioeconomic History  . Marital status: Divorced    Spouse name: Not on file  . Number of children: 2  . Years of education: Not on file  . Highest education level: Associate degree: occupational, Scientist, product/process development, or vocational program  Occupational History  . Occupation: Retired/ work PT    Employer: [employer]  Tobacco Use  . Smoking status: Never Smoker  . Smokeless tobacco: Never Used  Vaping Use  . Vaping Use: Never used  Substance and Sexual Activity  . Alcohol use: No     Alcohol/week: 0.0 standard drinks    Comment: rare  . Drug use: No  . Sexual activity: Not on file  Other Topics Concern  . 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 There is no history of Mental illness.  ROS:   Please see the history of present illness.     All other systems  reviewed and are negative.  EKGs/Labs/Other Studies Reviewed:    The following studies were reviewed today:  Echo 02/2020 1. Left ventricular ejection fraction, by estimation, is 55 to 60%. The  left ventricle has normal function. The left ventricle has no regional  wall motion abnormalities. There is mild left ventricular hypertrophy.  Left ventricular diastolic parameters  are consistent with Grade I diastolic dysfunction (impaired relaxation).  2. Right ventricular systolic function is normal. The right ventricular  size is normal. There is normal pulmonary artery systolic pressure. The  estimated right ventricular systolic pressure is 22.2 mmHg.  3. Left atrial size was mildly dilated.  4. The mitral valve is normal in structure. Trivial mitral valve  regurgitation. No evidence of mitral stenosis.  5. The aortic valve is tricuspid. Aortic valve regurgitation is not  visualized. No aortic stenosis is present.  6. The inferior vena cava is normal in size with greater than 50%  respiratory variability, suggesting right atrial pressure of 3 mmHg.    EKG:  EKG is   ordered today.  The ekg ordered today demonstrates SR, PACS, 75bpm, nonspecific t wave changes, LVH  Recent Labs: 02/29/2020: ALT 13 03/01/2020: TSH 1.358 08/20/2020: BUN 8; Creatinine, Ser 0.70; Hemoglobin 10.5; Magnesium 2.1; Platelets 224; Potassium 3.5; Sodium 141  Recent Lipid Panel    Component Value Date/Time   CHOL 132 03/01/2020 0306   CHOL 151 12/05/2014 1117   TRIG 69 03/01/2020 0306   HDL 69 03/01/2020 0306   HDL 74 12/05/2014 1117   CHOLHDL 1.9 03/01/2020 0306   VLDL 14 03/01/2020 0306   LDLCALC 49 03/01/2020 0306   LDLCALC 50 11/29/2019 1512    Physical Exam:    VS:  BP (!) 148/98 (BP Location: Left Arm, Patient Position: Sitting, Cuff Size: Large)   Pulse 75   Ht 5\' 8"  (1.727 m)   Wt 261 lb (118.4 kg)   SpO2 98%   BMI 39.68 kg/m     Wt Readings from Last 3 Encounters:  08/30/20 261 lb (118.4 kg)  08/24/20 261 lb (118.4 kg)  08/19/20 268 lb 4.8 oz (121.7 kg)     GEN:  Well nourished, well developed in no acute distress HEENT: Normal NECK: No JVD; No carotid bruits LYMPHATICS: No lymphadenopathy CARDIAC: RRR, no murmurs, rubs, gallops RESPIRATORY:  Clear to auscultation without rales, wheezing or rhonchi  ABDOMEN: Soft, non-tender, non-distended MUSCULOSKELETAL:  No edema; No deformity  SKIN: Warm and dry NEUROLOGIC:  Alert and oriented x 3 PSYCHIATRIC:  Normal affect   ASSESSMENT:    1. Atrial fibrillation, unspecified type (HCC)   2. History of CVA (cerebrovascular accident) without residual deficits   3. Essential hypertension   4. Acute blood loss anemia   5. Morbid obesity (HCC)   6. Anemia, unspecified type    PLAN:    In order of problems listed above:  Paroxysmal A. fib  Eliquis held for recent anemia 2/2 nosebleed from prior ENT surgery. She had re-look ENT procedure 5/9. She denies further bleeding. She remains off Eliquis. In SR today. Most recent Hgb was 10.5 on 5/2. Re-check CBC today. She has follow-up with ENT on Monday. Restart  Eliquis as soon as safely possible per ENT recommendations for CHA2DS2-VASc of 6. Continue metoprolol. We will keep close follow-up.   Anemia 2/2 to epistaxis No further nose bleeding since discharge. CBC as above. Has follow-up with ENT next week.   HTN BP cuff brought today, will compare home cuff. Bps elevated at  home and here. Increase amlodipine 5 to 10mg  daily. Continue metorpolol 100mg  BID, olmesartan 40 mg daily. Follow-up in 2 weeks.  HFpEF Euvolemic on exam. Not on diuretic at baseline. Continue BB and ARB.   Obesity Has been tired/fatuiged given recent hospitalization/procedures. Plans of further weight loss.   OSA Occasional compliance with CPAP  Disposition: Follow up in 2 week(s) with APP/MD   Signed, Elayjah [name] , PA-C  08/30/2020 10:22 AM    [facility]

## 2020-08-31 LAB — CBC
Hematocrit: 36.9 % (ref 34.0–46.6)
Hemoglobin: 11.9 g/dL (ref 11.1–15.9)
MCH: 28.9 pg (ref 26.6–33.0)
MCHC: 32.2 g/dL (ref 31.5–35.7)
MCV: 90 fL (ref 79–97)
Platelets: 326 10*3/uL (ref 150–450)
RBC: 4.12 x10E6/uL (ref 3.77–5.28)
RDW: 14.8 % (ref 11.7–15.4)
WBC: 9.6 10*3/uL (ref 3.4–10.8)

## 2020-09-04 ENCOUNTER — Encounter: Payer: Self-pay | Admitting: Psychiatry

## 2020-09-04 ENCOUNTER — Telehealth (INDEPENDENT_AMBULATORY_CARE_PROVIDER_SITE_OTHER): Payer: BC Managed Care – PPO | Admitting: Psychiatry

## 2020-09-04 ENCOUNTER — Other Ambulatory Visit: Payer: Self-pay

## 2020-09-04 DIAGNOSIS — F5105 Insomnia due to other mental disorder: Secondary | ICD-10-CM | POA: Diagnosis not present

## 2020-09-04 DIAGNOSIS — F3342 Major depressive disorder, recurrent, in full remission: Secondary | ICD-10-CM | POA: Diagnosis not present

## 2020-09-04 DIAGNOSIS — F411 Generalized anxiety disorder: Secondary | ICD-10-CM | POA: Diagnosis not present

## 2020-09-04 MED ORDER — VENLAFAXINE HCL ER 75 MG PO CP24: 75.0000 mg | ORAL_CAPSULE | Freq: Every day | ORAL | 0 refills | Status: DC

## 2020-09-04 MED ORDER — VENLAFAXINE HCL ER 37.5 MG PO CP24: 37.5000 mg | ORAL_CAPSULE | Freq: Every day | ORAL | 0 refills | Status: DC

## 2020-09-04 NOTE — Progress Notes (Signed)
Virtual Visit via Video Note  I connected with Regina Christensen on 09/04/20 at  8:30 AM EDT by a video enabled telemedicine application and verified that I am speaking with the correct person using two identifiers.  Location Provider Location : Office Patient Location : Home  Participants: Patient , Provider    I discussed the limitations of evaluation and management by telemedicine and the availability of in person appointments. The patient expressed understanding and agreed to proceed.    I discussed the assessment and treatment plan with the patient. The patient was provided an opportunity to ask questions and all were answered. The patient agreed with the plan and demonstrated an understanding of the instructions.   The patient was advised to call back or seek an in-person evaluation if the symptoms worsen or if the condition fails to improve as anticipated.  Video connection was lost at less than 50% of the duration of the visit, at which time the remainder of the visit was completed through audio only   [facility] Note  09/04/2020 2:52 PM Regina Christensen  MRN:  [id number]  Chief Complaint:  Chief Complaint    Follow-up; Anxiety     HPI: Regina Christensen is a 65 year old female, retired, lives in [place], has a history of GAD, MDD, hypertension, gastric bypass, hyperlipidemia, OSA, arthritis was evaluated by telemedicine today.  Patient today reports she is currently recovering from a sinus surgery.  Patient underwent bilateral endoscopic sinus surgery, right endoscopic total ethmoidectomy with frontal sinusostomy, left endoscopic partial ethmoidectomy, endoscopic trimming of both concha bullosa of the middle turbinates.  This was on 08/08/2020.  She was later admitted for epistaxis left-sided, acute onset and was admitted-4/27 - 08/20/2020.  Patient today reports she had her cardiology appointment on Aug 30, 2020- her amlodipine was increased to 10 mg for blood  pressure control.  Patient does report she continues to have anxiety about her recent sinus surgery and recovery.  She otherwise denies any significant depression symptoms.  She does report she has had nightmares from the Rozerem, her sleep medication.  She wonders whether she can just stop taking it since she has been sleeping okay without any medication.  She would like to try it.  Patient currently denies any suicidality, homicidality or perceptual disturbances.  Patient denies any other concerns today.  Visit Diagnosis:    ICD-10-CM   1. GAD (generalized anxiety disorder)  F41.1 venlafaxine XR (EFFEXOR XR) 75 MG 24 hr capsule    venlafaxine XR (EFFEXOR-XR) 37.5 MG 24 hr capsule  2. MDD (major depressive disorder), recurrent, in full remission (HCC)  F33.42 venlafaxine XR (EFFEXOR XR) 75 MG 24 hr capsule  3. Insomnia due to mental condition  F51.05     Past Psychiatric History: I have reviewed past psychiatric history from progress note on 01/27/2019.  Past trials of Prozac, Effexor.  Past Medical History:  Past Medical History:  Diagnosis Date  . Anxiety   . Arthritis   . Back pain   . BPPV (benign paroxysmal positional vertigo)   . Chronic diastolic HF (heart failure) (HCC)   . Complication of anesthesia    Post-operative hypoxia requiring supplemental oxygen  . Current use of long term anticoagulation    Apixaban  . CVA (cerebral vascular accident) (HCC) 02/28/2020   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; imaging from [facility]  . Depression   . Edema of both lower extremities   . GERD (gastroesophageal reflux disease)   .  Hypertension   . IBS (irritable bowel syndrome)   . Lactose intolerance   . Morbid obesity with BMI of 45.0-49.9, adult (HCC)   . Obesity   . Osteoarthritis   . PAF (paroxysmal atrial fibrillation) (HCC)   . Pre-diabetes   . Sleep apnea    uses CPAP machine sometimes   . Type 2 diabetes mellitus without complication (HCC)   . Vitamin D  deficiency     Past Surgical History:  Procedure Laterality Date  . ABDOMINAL HYSTERECTOMY    . BREAST EXCISIONAL BIOPSY Left   . ENDOMETRIAL ABLATION     x2  . ENDOSCOPIC CONCHA BULLOSA RESECTION Bilateral 08/08/2020   Procedure: ENDOSCOPIC CONCHA BULLOSA RESECTION;  Surgeon: Vernie Murders, MD;  Location: [facility];  Service: ENT;  Laterality: Bilateral;  . ETHMOIDECTOMY Right 08/08/2020   Procedure: ETHMOIDECTOMY, LEFT PARTIAL ETHMOIDECTOMY;  Surgeon: Vernie Murders, MD;  Location: [facility];  Service: ENT;  Laterality: Right;  . IMAGE GUIDED SINUS SURGERY N/A 08/08/2020   Procedure: IMAGE GUIDED SINUS SURGERY, RIGHT FRONTAL SINUSOTOMY;  Surgeon: Vernie Murders, MD;  Location: [facility];  Service: ENT;  Laterality: N/A;  . LAPAROSCOPIC SLEEVE GASTRECTOMY    . MAXILLARY ANTROSTOMY Bilateral 08/08/2020   Procedure: MAXILLARY ANTROSTOMY with tissue;  Surgeon: Vernie Murders, MD;  Location: [facility];  Service: ENT;  Laterality: Bilateral;  . NASAL TURBINATE REDUCTION  08/27/2020   Procedure: TURBINATE REDUCTION /SUBMUCOSAL DEBRIDEMENT;  Surgeon: Vernie Murders, MD;  Location: [facility];  Service: ENT;;  . TOTAL KNEE ARTHROPLASTY Right     Family Psychiatric History: I have reviewed past psychiatric history from progress note on 01/27/2019.  Past trials of Prozac, Effexor  Family History:  Family History  Problem Relation Age of Onset  . Diabetes Mother   . High blood pressure Mother   . Heart disease Father   . Cancer Brother   . Mental illness Neg Hx     Social History: I have reviewed social history from progress note on 01/27/2019 Social History   Socioeconomic History  . Marital status: Divorced    Spouse name: Not on file  . Number of children: 2  . Years of education: Not on file  . Highest education level: Associate degree: occupational, Scientist, product/process development, or vocational program  Occupational History  . Occupation: Retired/ work PT    Employer: [employer]  Tobacco Use  . Smoking status: Never  Smoker  . Smokeless tobacco: Never Used  Vaping Use  . Vaping Use: Never used  Substance and Sexual Activity  . Alcohol use: No    Alcohol/week: 0.0 standard drinks    Comment: rare  . Drug use: No  . Sexual activity: Not on file  Other Topics Concern  . 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 Hydrocodone-Acetaminophen Other (See Comments)    Extreme headache  . Ace Inhibitors Cough  . Hctz [Hydrochlorothiazide] Rash    face    Metabolic Disorder Labs: Lab Results  Component Value Date   HGBA1C  6.0 (H) 08/16/2020   MPG 125.5 08/16/2020   MPG 131.24 03/01/2020   No results found for: PROLACTIN Lab Results  Component Value Date   CHOL 132 03/01/2020   TRIG 69 03/01/2020   HDL 69 03/01/2020   CHOLHDL 1.9 03/01/2020   VLDL 14 03/01/2020   LDLCALC 49 03/01/2020   LDLCALC 50 11/29/2019   Lab Results  Component Value Date   TSH 1.358 03/01/2020   TSH 1.05 11/29/2019    Therapeutic Level Labs: No results found for: LITHIUM No results found for: VALPROATE No components found for:  CBMZ  Current Medications: Current Outpatient Medications  Medication Sig Dispense Refill  . venlafaxine XR (EFFEXOR XR) 75 MG 24 hr capsule Take 1 capsule (75 mg total) by mouth daily with breakfast. Take along with 37.5 mg daily 90 capsule 0  . venlafaxine XR (EFFEXOR-XR) 37.5 MG 24 hr capsule Take 1 capsule (37.5 mg total) by mouth daily with breakfast. Take along with 75 mg daily 90 capsule 0  . acetaminophen (TYLENOL) 650 MG CR tablet Take 1 tablet (650 mg total) by  mouth every 8 (eight) hours as needed for pain.    Marland Kitchen amLODipine (NORVASC) 10 MG tablet Take 1 tablet (10 mg total) by mouth daily. 90 tablet 1  . famotidine (PEPCID) 20 MG tablet Take 1 tablet (20 mg total) by mouth daily. 90 tablet 1  . levofloxacin (LEVAQUIN) 500 MG tablet Take 500 mg by mouth daily.    . meclizine (ANTIVERT) 12.5 MG tablet Take 1 tablet (12.5 mg total) by mouth 3 (three) times daily as needed for dizziness. 30 tablet 0  . metoprolol tartrate (LOPRESSOR) 100 MG tablet Take 1 tablet (100 mg total) by mouth 2 (two) times daily. 180 tablet 3  . olmesartan (BENICAR) 40 MG tablet Take 1 tablet (40 mg total) by mouth daily. 90 tablet 0  . Semaglutide, 1 MG/DOSE, (OZEMPIC, 1 MG/DOSE,) 2 MG/1.5ML SOPN Inject 1 mg into the skin once a week. Sunday 9 mL 1  . triamcinolone (NASACORT) 55 MCG/ACT AERO nasal inhaler Place 2 sprays into the nose daily.    . Vitamin D, Ergocalciferol, (DRISDOL) 1.25 MG (50000 UNIT) CAPS capsule Take 1 capsule (50,000 Units total) by mouth every 7 (seven) days. 4 capsule 0  . XIIDRA 5 % SOLN Place 1 drop into both eyes 2 (two) times daily.  4   No current facility-administered medications for this visit.     Musculoskeletal: Strength & Muscle Tone: UTA Gait & Station: UTA Patient leans: N/A  Psychiatric Specialty Exam: Review of Systems  Psychiatric/Behavioral: The patient is nervous/anxious.   All other systems reviewed and are negative.   There were no vitals taken for this visit.There is no height or weight on file to calculate BMI.  General Appearance: Casual  Eye Contact:  Fair  Speech:  Normal Rate  Volume:  Normal  Mood:  Anxious  Affect:  Congruent  Thought Process:  Goal Directed and Descriptions of Associations: Intact  Orientation:  Full (Time, Place, and Person)  Thought Content: Logical   Suicidal Thoughts:  No  Homicidal Thoughts:  No  Memory:  Immediate;   Fair Recent;   Fair Remote;   Fair  Judgement:  Fair  Insight:  Fair   Psychomotor Activity:  Normal  Concentration:  Concentration: Fair and Attention Span: Fair  Recall:  [employer] of Knowledge: Fair  Language: Fair  Akathisia:  No  Handed:  Right  AIMS (if indicated): UTA  Assets:  Communication Skills Desire for Improvement Housing  ADL's:  Intact  Cognition: WNL  Sleep:  Fair   Screenings: GAD-7   Flowsheet Row Video Visit from 09/04/2020 in [facility] Office Visit from 01/27/2019 in [facility] Office Visit from 01/21/2019 in [facility] Office Visit from 06/21/2018 in [facility] Office Visit from 04/26/2018 in [facility]  Total GAD-7 Score 2 10 6  0 0    PHQ2-9   Flowsheet Row Video Visit from 09/04/2020 in [facility] Video Visit from 06/18/2020 in [facility] Office Visit from 06/06/2020 in [facility] Office Visit from 05/10/2020 in [facility] Video Visit from 03/22/2020 in [facility]  PHQ-2 Total Score 0 0 0 2 0  PHQ-9 Total Score 1 -- 2 12 5     Flowsheet Row Video Visit from 09/04/2020 in [facility] Pre-Admission Testing 46 from 08/24/2020 in [facility] PRE ADMISSION TESTING ED to Hosp-Admission (Discharged) from 08/15/2020 in [facility]U  C-SSRS RISK CATEGORY No Risk No Risk No Risk       Assessment and Plan: SOLOMIYA PASCALE is a 65 year old African-American female, divorced, retired, lives in [place], has a history of anxiety, depression, hypertension, hyperlipidemia, OSA, arthritis was evaluated by telemedicine today.  Patient with psychosocial stressors of recent surgery, medical problems.  She will benefit from the following plan.  Plan GAD- stable Reduce venlafaxine extended release to 112.5 mg p.o. daily Patient advised to monitor  her symptoms closely.  MDD in remission Reduce venlafaxine dosage as discussed above.  Insomnia - stable Discontinue Rozerem for side effects of nightmares Continue CPAP Continue sleep hygiene techniques.  Also discussed using over-the-counter melatonin as needed.  Patient to continue to follow-up with her cardiologist .  Follow-up in clinic in 3 to 4 weeks in office.   I have spent at least 19 minutes non face to face with patient today.  This note was generated in part or whole with voice recognition software. Voice recognition is usually quite accurate but there are transcription errors that can and very often do occur. I apologize for any typographical errors that were not detected and corrected.         78, MD 09/04/2020, 2:52 PM

## 2020-09-07 NOTE — Progress Notes (Signed)
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 Eliquis is on hold at this time due to recent nose bleeds, but seeing cardiologist and also hematologist to decide when to resume medication  IMPRESSION: 2021 1. 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  2. 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  3. 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    MR Angio neck and head with and without contrast was normal   Echo was negative for thrombus, mild diastolic dysfunction    Vertigo: she was diagnosed with BPPV previously had Epley maneuver, but symptoms have been more intense and was seen by ENT and neurologist , she had vestibular rehab but still has mild intermittent episodes , she still has meclizine at home   Recurrent sinusitis: she was supposed to have sinus surgery but it was postponed due to stroke, she will call to re-scheduled   Afib: she states since she has been on higher dose of Metoprolol at 100 mg BIDshe was not compliant initially with  Eliquis but since CVA  11/09/2021she was very compliant with medication, however had nose bleed that required blood transfusion after sinus surgery in April, she is under the care of hematologist but is not back on Eliquis yet. She denies any symptoms of stroke   MDD/GAD: she is seeing Dr. Elna Christensen and also therapist. 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 She is compliant with medication and is taking Effexor but titrating down the dose, and the goal is to go down to 75 mg.  Insomnia: she states Dr. Elna Christensen, she was given rx of Rozerem but states it caused nightmares, she states she  is going back to Melatonin now   HTN: she is only on lopressor, amlodipine and Benicar, off clonidine. No chest pain or palpitation   Morbid obesity: [facility], lost another 2 lbs , lost total of 9 lbs since January 2022, following a diet plan that they gave to her - she states had a gap in follow up due to recent sinus surgery followed by complications .   OA knees: had surgery right knee, TKR and will go for left knee replacement, but postponed because of recent stroke Doing better with right side, still has some pain going up and down stairs, left knee is weaker and has grinding sensation, she will see Ortho - Dr. Odis Christensen .   Trigeminal neuralgia. She used to take tegretol in the past and needs a refill, pain is shooting and had been gone for years but had some pain end of December 2020 , no flares in months. She stopped lyrica on her own, but over this past weekend she noticed a headache behind left ear that radiated to face , took Tylenol and today is feeling better. Explained it may be a flare  Pre-diabetes: she is on Ozempic, last A1C was 6.2. %, she denies polyphagia, polyuria or polydipsia . She would prefer holding off on rechecking labs with other labs this Fall   History of sinus surgery: she had complications / bleeding , developed anemia, but last  level was back to normal since she received 3 units of RBC   Patient Active Problem List   Diagnosis Date Noted  . History of blood transfusion 09/10/2020  . Epistaxis 08/16/2020  . Current use of long term anticoagulation   . Left-sided epistaxis   . Type 2 diabetes mellitus without complication (HCC)   . AF (paroxysmal atrial fibrillation) (HCC)   . Gastroesophageal reflux disease   . Prediabetes 06/12/2020  . Vitamin D deficiency 04/12/2020  . History of CVA (cerebrovascular accident) without residual deficits 03/05/2020  . Vertigo 03/01/2020  . Acute CVA (cerebrovascular accident)  (HCC) 02/29/2020  . Headache disorder 12/20/2019  . Leukocytosis 07/19/2019  . History of hysterectomy 07/19/2019  . Drowsy 07/19/2019  . Anxiety 07/19/2019  . MDD (major depressive disorder), recurrent, in full remission (HCC) 07/19/2019  . 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 History of total knee arthroplasty 06/06/2019  . A-fib (HCC) 02/08/2019  . Atrial fibrillation with rapid ventricular response (HCC) 02/07/2019  . Atrial fibrillation, new onset (HCC) 02/03/2019  . Bilateral carpal tunnel syndrome 07/30/2016  . OSA (obstructive sleep apnea) 03/07/2016  . MDD (major depressive disorder), recurrent episode, mild (HCC) 11/30/2015  . Osteoarthritis of both knees 11/30/2015  . BPPV (benign paroxysmal positional vertigo) 08/28/2015  . Essential hypertension 11/09/2014  . Hyperglycemia 11/09/2014  . GAD (generalized anxiety disorder) 11/09/2014  . Acanthosis nigricans 11/09/2014  . Class 3 severe obesity with serious comorbidity and body mass index (BMI) of 40.0 to 44.9 in adult [facility]) 01/06/2012    Past Surgical History:  Procedure Laterality Date  . ABDOMINAL HYSTERECTOMY    . BREAST EXCISIONAL BIOPSY Left   . ENDOMETRIAL ABLATION     x2  . ENDOSCOPIC CONCHA BULLOSA RESECTION Bilateral 08/08/2020   Procedure: ENDOSCOPIC CONCHA BULLOSA RESECTION;  Surgeon: Vernie Murders, MD;  Location: [facility] ORS;  Service: ENT;  Laterality: Bilateral;  . ETHMOIDECTOMY Right 08/08/2020   Procedure: ETHMOIDECTOMY, LEFT PARTIAL ETHMOIDECTOMY;  Surgeon: Vernie Murders, MD;  Location: [facility] ORS;  Service: ENT;  Laterality: Right;  . IMAGE GUIDED SINUS SURGERY N/A 08/08/2020   Procedure: IMAGE GUIDED SINUS SURGERY, RIGHT FRONTAL SINUSOTOMY;  Surgeon: Vernie Murders, MD;  Location: [facility] ORS;  Service: ENT;  Laterality: N/A;  . LAPAROSCOPIC SLEEVE GASTRECTOMY    . MAXILLARY ANTROSTOMY Bilateral 08/08/2020   Procedure: MAXILLARY ANTROSTOMY with tissue;  Surgeon: Vernie Murders, MD;  Location: [facility] ORS;   Service: ENT;  Laterality: Bilateral;  . NASAL TURBINATE REDUCTION  08/27/2020   Procedure: TURBINATE REDUCTION /SUBMUCOSAL DEBRIDEMENT;  Surgeon: Vernie Murders, MD;  Location: [facility] ORS;  Service: ENT;;  . TOTAL KNEE ARTHROPLASTY Right     Family History  Problem Relation Age of Onset  . Diabetes Mother   . High blood pressure Mother   . Heart disease Father   . Cancer Brother   . Mental illness Neg Hx     Social History   Tobacco Use  . Smoking status: Never Smoker  . Smokeless tobacco: Never Used  Substance Use Topics  . Alcohol use: No    Alcohol/week: 0.0 standard drinks    Comment: rare     Current Outpatient Medications:  .  acetaminophen (TYLENOL) 650 MG CR tablet, Take 1 tablet (650 mg total) by mouth every 8 (eight) hours as needed for pain., Disp: , Rfl:  .  amLODipine (NORVASC) 10 MG tablet, Take 1 tablet (10 mg total) by mouth daily., Disp: 90 tablet, Rfl: 1 .  famotidine (PEPCID) 20 MG  tablet, Take 1 tablet (20 mg total) by mouth daily., Disp: 90 tablet, Rfl: 1 .  levofloxacin (LEVAQUIN) 500 MG tablet, Take 500 mg by mouth daily., Disp: , Rfl:  .  meclizine (ANTIVERT) 12.5 MG tablet, Take 1 tablet (12.5 mg total) by mouth 3 (three) times daily as needed for dizziness., Disp: 30 tablet, Rfl: 0 .  metoprolol tartrate (LOPRESSOR) 100 MG tablet, Take 1 tablet (100 mg total) by mouth 2 (two) times daily., Disp: 180 tablet, Rfl: 3 .  olmesartan (BENICAR) 40 MG tablet, Take 1 tablet (40 mg total) by mouth daily., Disp: 90 tablet, Rfl: 0 .  Semaglutide, 1 MG/DOSE, (OZEMPIC, 1 MG/DOSE,) 4 MG/3ML SOPN, Inject 1 mg into the skin once a week., Disp: 9 mL, Rfl: 1 .  triamcinolone (NASACORT) 55 MCG/ACT AERO nasal inhaler, Place 2 sprays into the nose daily., Disp: , Rfl:  .  venlafaxine XR (EFFEXOR XR) 75 MG 24 hr capsule, Take 1 capsule (75 mg total) by mouth daily with breakfast. Take along with 37.5 mg daily, Disp: 90 capsule, Rfl: 0 .  venlafaxine XR (EFFEXOR-XR) 37.5 MG 24 hr  capsule, Take 1 capsule (37.5 mg total) by mouth daily with breakfast. Take along with 75 mg daily, Disp: 90 capsule, Rfl: 0 .  Vitamin D, Ergocalciferol, (DRISDOL) 1.25 MG (50000 UNIT) CAPS capsule, Take 1 capsule (50,000 Units total) by mouth every 7 (seven) days., Disp: 4 capsule, Rfl: 0 .  XIIDRA 5 % SOLN, Place 1 drop into both eyes 2 (two) times daily., Disp: , Rfl: 4  Allergies  Allergen Reactions  . Hydrocodone-Acetaminophen Other (See Comments)    Extreme headache  . Ace Inhibitors Cough  . Hctz [Hydrochlorothiazide] Rash    face    I personally reviewed active problem list, medication list, allergies, family history, social history, health maintenance with the patient/caregiver today.   ROS  Constitutional: Negative for fever or weight change.  Respiratory: Negative for cough and shortness of breath.   Cardiovascular: Negative for chest pain or palpitations.  Gastrointestinal: Negative for abdominal pain, no bowel changes.  Musculoskeletal: Negative for gait problem or joint swelling.  Skin: Negative for rash.  Neurological: Negative for dizziness or headache.  No other specific complaints in a complete review of systems (except as listed in HPI above).  Objective  Vitals:   09/10/20 0746  BP: 138/80  Pulse: 82  Resp: 16  Temp: 97.9 F (36.6 C)  TempSrc: Oral  SpO2: 98%  Weight: 259 lb (117.5 kg)  Height: 5\' 8"  (1.727 m)    Body mass index is 39.38 kg/m.  Physical Exam  Constitutional: Patient appears well-developed and well-nourished. Obese  No distress.  HEENT: head atraumatic, normocephalic, pupils equal and reactive to light,  neck supple Cardiovascular: Normal rate, regular rhythm and normal heart sounds.  2/6 systolic ejection  murmur heard. No BLE edema. Pulmonary/Chest: Effort normal and breath sounds normal. No respiratory distress. Abdominal: Soft.  There is no tenderness. Psychiatric: Patient has a normal mood and affect. behavior is normal.  Judgment and thought content normal. Muscular Skeletal: crepitus with extension of left knee, no effusion, slowly got out from the chair     PHQ2/9: Depression screen [place] 2/9 09/10/2020 06/06/2020 05/10/2020 03/22/2020 03/05/2020  Decreased Interest 0 0 1 0 0  Down, Depressed, Hopeless 0 0 1 0 0  PHQ - 2 Score 0 0 2 0 0  Altered sleeping - 1 2 3  -  Tired, decreased energy - 1 2 1  -  Change in appetite - 0 2 0 -  Feeling bad or failure about yourself  - 0 2 0 -  Trouble concentrating - 0 1 1 -  Moving slowly or fidgety/restless - 0 1 0 -  Suicidal thoughts - 0 0 0 -  PHQ-9 Score - 2 12 5  -  Difficult doing work/chores - - Somewhat difficult Not difficult at all -  Some encounter information is confidential and restricted. Go to Review Flowsheets activity to see all data.  Some recent data might be hidden    phq 9 is negative  Fall Risk: Fall Risk  09/10/2020 06/06/2020 03/22/2020 03/05/2020 11/29/2019  Falls in the past year? 0 0 0 0 0  Number falls in past yr: 0 0 0 0 0  Injury with Fall? 0 0 0 0 0  Risk for fall due to : - - - - -  Follow up - - - - -    Functional Status Survey: Is the patient deaf or have difficulty hearing?: No Does the patient have difficulty seeing, even when wearing glasses/contacts?: No Does the patient have difficulty concentrating, remembering, or making decisions?: No Does the patient have difficulty walking or climbing stairs?: Yes Does the patient have difficulty dressing or bathing?: No Does the patient have difficulty doing errands alone such as visiting a doctor's office or shopping?: No    Assessment & Plan  1. History of blood transfusion   2. Metabolic syndrome  - Semaglutide, 1 MG/DOSE, (OZEMPIC, 1 MG/DOSE,) 4 MG/3ML SOPN; Inject 1 mg into the skin once a week.  Dispense: 9 mL; Refill: 1  3. OSA (obstructive sleep apnea)  Not compliant since sinus surgery   4. Morbid obesity (HCC)  Discussed with the patient the risk posed by an  increased BMI. Discussed importance of portion control, calorie counting and at least 150 minutes of physical activity weekly. Avoid sweet beverages and drink more water. Eat at least 6 servings of fruit and vegetables daily   5. Benign paroxysmal positional vertigo, unspecified laterality   6. Essential hypertension  At goal   7. Persistent atrial fibrillation (HCC)   8. GERD without esophagitis  Under control   9. Major depression in remission [facility](HCC)  Keep follow up with Dr. Elna BreslowEappen   10. History of CVA (cerebrovascular accident) without residual deficits   11. Primary osteoarthritis of both knees  She is considering going back for surgery of left knee

## 2020-09-10 ENCOUNTER — Encounter: Payer: Self-pay | Admitting: Family Medicine

## 2020-09-10 ENCOUNTER — Other Ambulatory Visit: Payer: Self-pay

## 2020-09-10 ENCOUNTER — Ambulatory Visit: Payer: BC Managed Care – PPO | Admitting: Family Medicine

## 2020-09-10 VITALS — BP 138/80 | HR 82 | Temp 97.9°F | Resp 16 | Ht 68.0 in | Wt 259.0 lb

## 2020-09-10 DIAGNOSIS — F325 Major depressive disorder, single episode, in full remission: Secondary | ICD-10-CM

## 2020-09-10 DIAGNOSIS — H8110 Benign paroxysmal vertigo, unspecified ear: Secondary | ICD-10-CM

## 2020-09-10 DIAGNOSIS — Z8673 Personal history of transient ischemic attack (TIA), and cerebral infarction without residual deficits: Secondary | ICD-10-CM

## 2020-09-10 DIAGNOSIS — Z9289 Personal history of other medical treatment: Secondary | ICD-10-CM | POA: Diagnosis not present

## 2020-09-10 DIAGNOSIS — K219 Gastro-esophageal reflux disease without esophagitis: Secondary | ICD-10-CM

## 2020-09-10 DIAGNOSIS — G4733 Obstructive sleep apnea (adult) (pediatric): Secondary | ICD-10-CM

## 2020-09-10 DIAGNOSIS — E8881 Metabolic syndrome: Secondary | ICD-10-CM

## 2020-09-10 DIAGNOSIS — M170 Bilateral primary osteoarthritis of knee: Secondary | ICD-10-CM

## 2020-09-10 DIAGNOSIS — I10 Essential (primary) hypertension: Secondary | ICD-10-CM

## 2020-09-10 DIAGNOSIS — I4819 Other persistent atrial fibrillation: Secondary | ICD-10-CM

## 2020-09-10 MED ORDER — OZEMPIC (1 MG/DOSE) 4 MG/3ML ~~LOC~~ SOPN: 1.0000 mg | PEN_INJECTOR | SUBCUTANEOUS | 1 refills | Status: DC

## 2020-09-13 ENCOUNTER — Encounter: Payer: Self-pay | Admitting: Medical

## 2020-09-13 ENCOUNTER — Ambulatory Visit (INDEPENDENT_AMBULATORY_CARE_PROVIDER_SITE_OTHER): Payer: BC Managed Care – PPO | Admitting: Medical

## 2020-09-13 ENCOUNTER — Other Ambulatory Visit: Payer: Self-pay

## 2020-09-13 VITALS — BP 130/82 | HR 73 | Ht 68.0 in | Wt 260.0 lb

## 2020-09-13 DIAGNOSIS — I5032 Chronic diastolic (congestive) heart failure: Secondary | ICD-10-CM | POA: Diagnosis not present

## 2020-09-13 DIAGNOSIS — I4891 Unspecified atrial fibrillation: Secondary | ICD-10-CM

## 2020-09-13 DIAGNOSIS — G4733 Obstructive sleep apnea (adult) (pediatric): Secondary | ICD-10-CM

## 2020-09-13 DIAGNOSIS — D62 Acute posthemorrhagic anemia: Secondary | ICD-10-CM

## 2020-09-13 NOTE — Progress Notes (Signed)
Cardiology Office Note:    Date:  09/13/2020   ID:  Regina Christensen, DOB [birth year], MRN [id number]  PCP:  [name], MD  [facility] Cardiologist:  Debbe Odea, MD  [facility] Electrophysiologist:  None   Referring MD: [name], MD   Chief Complaint: 2-week follow-up  History of Present Illness:    Regina Christensen is a 65 y.o. female with a hx of hypertension, vitamin D deficiency, prediabetes, obesity status post sleeve gastrectomy, paroxysmal l A. fib on Eliquis, sleep apnea, CVA November 2021 in the setting of Eliquis noncompliance who presents for 2-week follow-up.  The patient underwent sinus surgery with bilateral endoscopic maxillary and hysterectomies, right endoscopic total ethmoidectomy with frontal sinusotomy and left endoscopic partial ethmoidectomy and endoscopic trimming of both conscious bullosa of the middle turbinates.  Patient was bridged with Lovenox for 2 days prior to the surgery.  She resumed Eliquis 421.  Patient was admitted 4/27-5/2 for left-sided epistaxis with no recent trauma.  Hemoglobin down to 7.7 status post 3 units PRBCs.  Eliquis was held.  Discharge hemoglobin 11.6.  On 08/27/2020 she underwent redo sinus surgery.  She remained in sinus rhythm.  Plan was to remain off blood thinners.  She saw the ENT for 1 week follow-up in the office.  Patient last seen 08/30/2020 in the cardiology office was in sinus rhythm.  She remained off Eliquis.  Further Eliquis instructions pending ENT follow-up.  Today, the patient reports she saw ENT 1-2 weeks ago and MD instructed she continue to stay OFF Eliquis. We are unable to access his notes. CBC showed Hgb 11.9 on 5/12. She sees ENT tomorrow and she will ask about restarting Eliquis. Denies further bleeding. No chest pain or SOB. Patient reports persistent SOB with exertion, suspect it's multifacotorial from recent hospitalization. Discussed compliance with CPAP, says she will work on this.  Euvolemic on exam. EKG shows SR.  Past Medical History:  Diagnosis Date  . Anxiety   . Arthritis   . Back pain   . BPPV (benign paroxysmal positional vertigo)   . Chronic diastolic HF (heart failure) (HCC)   . Complication of anesthesia    Post-operative hypoxia requiring supplemental oxygen  . Current use of long term anticoagulation    Apixaban  . CVA (cerebral vascular accident) (HCC) 02/28/2020   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; imaging from [facility]  . Depression   . Edema of both lower extremities   . GERD (gastroesophageal reflux disease)   . Hypertension   . IBS (irritable bowel syndrome)   . Lactose intolerance   . Morbid obesity with BMI of 45.0-49.9, adult (HCC)   . Obesity   . Osteoarthritis   . PAF (paroxysmal atrial fibrillation) (HCC)   . Pre-diabetes   . Sleep apnea    uses CPAP machine sometimes   . Type 2 diabetes mellitus without complication (HCC)   . Vitamin D deficiency     Past Surgical History:  Procedure Laterality Date  . ABDOMINAL HYSTERECTOMY    . BREAST EXCISIONAL BIOPSY Left   . ENDOMETRIAL ABLATION     x2  . ENDOSCOPIC CONCHA BULLOSA RESECTION Bilateral 08/08/2020   Procedure: ENDOSCOPIC CONCHA BULLOSA RESECTION;  Surgeon: Vernie Murders, MD;  Location: [facility] ORS;  Service: ENT;  Laterality: Bilateral;  . ETHMOIDECTOMY Right 08/08/2020   Procedure: ETHMOIDECTOMY, LEFT PARTIAL ETHMOIDECTOMY;  Surgeon: Vernie Murders, MD;  Location: [facility] OR[facility];  Service: ENT;  Laterality: Right;  . IMAGE GUIDED SINUS SURGERY  N/A 08/08/2020   Procedure: IMAGE GUIDED SINUS SURGERY, RIGHT FRONTAL SINUSOTOMY;  Surgeon: Vernie MurdersJuengel, Paul, MD;  Location: [facility] OR[facility];  Service: ENT;  Laterality: N/A;  . LAPAROSCOPIC SLEEVE GASTRECTOMY    . MAXILLARY ANTROSTOMY Bilateral 08/08/2020   Procedure: MAXILLARY ANTROSTOMY with tissue;  Surgeon: Vernie MurdersJuengel, Paul, MD;  Location: [facility] OR[facility];  Service: ENT;  Laterality: Bilateral;  . NASAL TURBINATE REDUCTION  08/27/2020   Procedure:  TURBINATE REDUCTION /SUBMUCOSAL DEBRIDEMENT;  Surgeon: Vernie MurdersJuengel, Paul, MD;  Location: [facility] OR[facility];  Service: ENT;;  . TOTAL KNEE ARTHROPLASTY Right     Current Medications: Current Meds  Medication Sig  . acetaminophen (TYLENOL) 650 MG CR tablet Take 1 tablet (650 mg total) by mouth every 8 (eight) hours as needed for pain.  Marland Kitchen. amLODipine (NORVASC) 10 MG tablet Take 1 tablet (10 mg total) by mouth daily.  . famotidine (PEPCID) 20 MG tablet Take 1 tablet (20 mg total) by mouth daily.  Marland Kitchen. levofloxacin (LEVAQUIN) 500 MG tablet Take 500 mg by mouth daily.  . meclizine (ANTIVERT) 12.5 MG tablet Take 1 tablet (12.5 mg total) by mouth 3 (three) times daily as needed for dizziness.  . metoprolol tartrate (LOPRESSOR) 100 MG tablet Take 1 tablet (100 mg total) by mouth 2 (two) times daily.  Marland Kitchen. olmesartan (BENICAR) 40 MG tablet Take 1 tablet (40 mg total) by mouth daily.  . Semaglutide, 1 MG/DOSE, (OZEMPIC, 1 MG/DOSE,) 4 MG/3ML SOPN Inject 1 mg into the skin once a week.  . triamcinolone (NASACORT) 55 MCG/ACT AERO nasal inhaler Place 2 sprays into the nose daily.  Marland Kitchen. venlafaxine XR (EFFEXOR XR) 75 MG 24 hr capsule Take 1 capsule (75 mg total) by mouth daily with breakfast. Take along with 37.5 mg daily  . venlafaxine XR (EFFEXOR-XR) 37.5 MG 24 hr capsule Take 1 capsule (37.5 mg total) by mouth daily with breakfast. Take along with 75 mg daily  . Vitamin D, Ergocalciferol, (DRISDOL) 1.25 MG (50000 UNIT) CAPS capsule Take 1 capsule (50,000 Units total) by mouth every 7 (seven) days.  Marland Kitchen. XIIDRA 5 % SOLN Place 1 drop into both eyes 2 (two) times daily.     Allergies:   Hydrocodone-acetaminophen, Ace inhibitors, and Hctz [hydrochlorothiazide]   Social History   Socioeconomic History  . Marital status: Divorced    Spouse name: Not on file  . Number of children: 2  . Years of education: Not on file  . Highest education level: Associate degree: occupational, Scientist, product/process developmenttechnical, or vocational program  Occupational History   . Occupation: Retired/ work PT    Employer: [employer]  Tobacco Use  . Smoking status: Never Smoker  . Smokeless tobacco: Never Used  Vaping Use  . Vaping Use: Never used  Substance and Sexual Activity  . Alcohol use: No    Alcohol/week: 0.0 standard drinks    Comment: rare  . Drug use: No  . Sexual activity: Not on file  Other Topics Concern  . 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 There is no history of Mental illness.  ROS:   Please see the history of present illness.     All other systems reviewed and are negative.  EKGs/Labs/Other Studies Reviewed:    The following studies were reviewed today:  Echo 02/2020 1. Left ventricular ejection fraction, by estimation, is 55 to 60%. The  left ventricle has normal function. The left ventricle has no regional  wall motion abnormalities. There is mild left ventricular hypertrophy.  Left ventricular diastolic parameters  are consistent with Grade I diastolic dysfunction (impaired relaxation).  2. Right ventricular systolic function is normal. The right ventricular  size is normal. There is normal pulmonary artery systolic pressure. The  estimated right ventricular systolic pressure is 22.2 mmHg.  3. Left atrial size was mildly dilated.  4. The mitral valve is normal in structure. Trivial mitral valve  regurgitation. No evidence of mitral stenosis.  5. The aortic valve is tricuspid. Aortic valve  regurgitation is not  visualized. No aortic stenosis is present.  6. The inferior vena cava is normal in size with greater than 50%  respiratory variability, suggesting right atrial pressure of 3 mmHg.   EKG:  EKG is  ordered today.  The ekg ordered today demonstrates NSR, 73bpm, , nonspecific ST/T wave changes, no change from prior  Recent Labs: 02/29/2020: ALT 13 03/01/2020: TSH 1.358 08/20/2020: BUN 8; Creatinine, Ser 0.70; Magnesium 2.1; Potassium 3.5; Sodium 141 08/30/2020: Hemoglobin 11.9; Platelets 326  Recent Lipid Panel    Component Value Date/Time   CHOL 132 03/01/2020 0306   CHOL 151 12/05/2014 1117   TRIG 69 03/01/2020 0306   HDL 69 03/01/2020 0306   HDL 74 12/05/2014 1117   CHOLHDL 1.9 03/01/2020 0306   VLDL 14 03/01/2020 0306   LDLCALC 49 03/01/2020 0306   LDLCALC 50 11/29/2019 1512    Physical Exam:    VS:  BP 130/82   Pulse 73   Ht 5\' 8"  (1.727 m)   Wt 260 lb (117.9 kg)   BMI 39.53 kg/m     Wt Readings from Last 3 Encounters:  09/13/20 260 lb (117.9 kg)  09/10/20 259 lb (117.5 kg)  08/30/20 261 lb (118.4 kg)     GEN:  Well nourished, well developed in no acute distress HEENT: Normal NECK: No JVD; No carotid bruits LYMPHATICS: No lymphadenopathy CARDIAC: RRR, no murmurs, rubs, gallops RESPIRATORY:  Clear to auscultation without rales, wheezing or rhonchi  ABDOMEN: Soft, non-tender, non-distended MUSCULOSKELETAL:  No edema; No deformity  SKIN: Warm and dry NEUROLOGIC:  Alert and oriented x 3 PSYCHIATRIC:  Normal affect   ASSESSMENT:    1. Atrial fibrillation, unspecified type (HCC)   2. Acute blood loss anemia   3. Chronic diastolic heart failure (HCC)   4. Morbid obesity (HCC)   5. OSA (obstructive sleep apnea)    PLAN:    In order of problems listed above:  Paroxysmal Afib In SR today. Still of Eliquis per ENT instructions. We are unable to see his notes. Appointment tomorrow with ENT and we will follow-up on Eliquis instructions. CBC  showed Hgb 11.9 on 5/12. When Eliquis is restarted we will need 2 week CBC. She has a follow-up in July with MD which we will keep.   Anemia 2/2 epistaxis Improving, but still not back at baseline Hgb 13 per recent labs. She denies further bleeding. Following with ENT as above. Last  Hgb 11.9. ENT appointment tomorrow.  HTN BP wnl. Continue current regimen  HFpEF Echo from 02/2020 with LVEF 55-60%, milf LVH, G1DD. Euvolemic on exam. Continue BB and ARB.   SOB Obesity OSA Shortness of breath likely multifactorial given recent hospitalization, deconditioning and CPAP compliance. Lifestyle changes encouraged. CPAP use discussed in detail.   Disposition: Follow up in 2 month(s) with MD    Signed, Bellarae Lizer David Stall, PA-C  09/13/2020 9:37 AM    [facility]

## 2020-09-13 NOTE — Patient Instructions (Signed)
Medication Instructions:  Your physician recommends that you continue on your current medications as directed. Please refer to the Current Medication list given to you today.  *If you need a refill on your cardiac medications before your next appointment, please call your pharmacy*   Lab Work: None ordered   Testing/Procedures: None ordered   Follow-Up: At [facility], you and your health needs are our priority.  As part of our continuing mission to provide you with exceptional heart care, we have created designated Provider Care Teams.  These Care Teams include your primary Cardiologist (physician) and Advanced Practice Providers (APPs -  Physician Assistants and Nurse Practitioners) who all work together to provide you with the care you need, when you need it.  We recommend signing up for the patient portal called "MyChart".  Sign up information is provided on this After Visit Summary.  MyChart is used to connect with patients for Virtual Visits (Telemedicine).  Patients are able to view lab/test results, encounter notes, upcoming appointments, etc.  Non-urgent messages can be sent to your provider as well.   To learn more about what you can do with MyChart, go to [email or web address].    Your next appointment:    Follow up as scheduled with Dr. Azucena Cecil in July  The format for your next appointment:   In Person  Provider:   You may see Debbe Odea, MD or one of the following Advanced Practice Providers on your designated Care Team:    Nicolasa Ducking, NP  Eula Listen, PA-C  Marisue Ivan, PA-C  Cadence Langdon, [place]  Gillian Shields, NP    Other Instructions  We are going to contact ENT tomorrow to determine recommendation to restart Eliquis.

## 2020-09-14 ENCOUNTER — Telehealth: Payer: Self-pay | Admitting: *Deleted

## 2020-09-14 DIAGNOSIS — Z7901 Long term (current) use of anticoagulants: Secondary | ICD-10-CM

## 2020-09-14 DIAGNOSIS — I4891 Unspecified atrial fibrillation: Secondary | ICD-10-CM

## 2020-09-14 NOTE — Telephone Encounter (Signed)
Pt seen in office yesterday by Terrilee Croak, PA.  Patient reports she saw ENT 1-2 weeks ago and MD instructed she continue to stay OFF Eliquis after sinus surgery 08/27/20.  Pt to follow up with Dr. Elenore Rota today. Called office and left message with nurse, Karel Jarvis, asking for follow up regarding plan for Eliquis.

## 2020-09-14 NOTE — Telephone Encounter (Signed)
Kendall at [facility] calling to discuss eliquis restart .  Please call [phone] .

## 2020-09-14 NOTE — Telephone Encounter (Signed)
Patient also calling to discuss when to restart.

## 2020-09-14 NOTE — Telephone Encounter (Signed)
Spoke with Penni Bombard at [facility] E[facility], states at office visit today with Dr. Elenore Rota, pt was approved to restart Eliquis at her previous dose.  Spoke to pt, she states she will restart Eliquis 5mg  TWICE daily per ENT instructions.  Pt will have CBC in 2 weeks at the medical mall at [facility]. Lab orders placed. Med list updated.  Forwarding to provider to make aware.

## 2020-09-24 ENCOUNTER — Other Ambulatory Visit: Payer: Self-pay

## 2020-09-24 MED ORDER — METOPROLOL TARTRATE 100 MG PO TABS: 100.0000 mg | ORAL_TABLET | Freq: Two times a day (BID) | ORAL | 0 refills | Status: DC

## 2020-10-03 ENCOUNTER — Other Ambulatory Visit
Admission: RE | Admit: 2020-10-03 | Discharge: 2020-10-03 | Disposition: A | Discharge status: Home / Self Care | Payer: BC Managed Care – PPO | Source: Ambulatory Visit | Attending: Medical | Admitting: Medical

## 2020-10-03 DIAGNOSIS — I4891 Unspecified atrial fibrillation: Secondary | ICD-10-CM | POA: Diagnosis present

## 2020-10-03 DIAGNOSIS — Z7901 Long term (current) use of anticoagulants: Secondary | ICD-10-CM

## 2020-10-03 LAB — CBC
HCT: 36.8 % (ref 36.0–46.0)
Hemoglobin: 12 g/dL (ref 12.0–15.0)
MCH: 30.2 pg (ref 26.0–34.0)
MCHC: 32.6 g/dL (ref 30.0–36.0)
MCV: 92.5 fL (ref 80.0–100.0)
Platelets: 283 10*3/uL (ref 150–400)
RBC: 3.98 MIL/uL (ref 3.87–5.11)
RDW: 15.8 % — ABNORMAL HIGH (ref 11.5–15.5)
WBC: 8.7 10*3/uL (ref 4.0–10.5)
nRBC: 0 % (ref 0.0–0.2)

## 2020-10-04 ENCOUNTER — Other Ambulatory Visit: Payer: Self-pay | Admitting: Family Medicine

## 2020-10-04 DIAGNOSIS — I10 Essential (primary) hypertension: Secondary | ICD-10-CM

## 2020-10-08 ENCOUNTER — Telehealth: Payer: BC Managed Care – PPO | Admitting: Psychiatry

## 2020-10-08 ENCOUNTER — Other Ambulatory Visit: Payer: Self-pay

## 2020-10-19 ENCOUNTER — Other Ambulatory Visit: Payer: Self-pay | Admitting: Psychiatry

## 2020-10-19 DIAGNOSIS — F411 Generalized anxiety disorder: Secondary | ICD-10-CM

## 2020-10-25 ENCOUNTER — Telehealth: Payer: Self-pay | Admitting: Cardiology

## 2020-10-25 NOTE — Telephone Encounter (Signed)
Please call to discuss prior authorization for Eliquis.

## 2020-10-26 NOTE — Telephone Encounter (Signed)
Attempted to initiate prior auth through [email or web address] however [employer] states PA has been resolved and no further PA is required.  Spoke with patient's pharmacy who states they did not have an active prescription on file. Verbal Rx given for Eliquis 5mg  tablets #60 one tablet BID No refills. Pharmacy was able to get prescription to go through without any issues.  Patient informed.

## 2020-10-27 ENCOUNTER — Other Ambulatory Visit: Payer: Self-pay | Admitting: Family Medicine

## 2020-10-27 DIAGNOSIS — I10 Essential (primary) hypertension: Secondary | ICD-10-CM

## 2020-10-27 NOTE — Telephone Encounter (Signed)
Requested medication (s) are due for refill today: no  Requested medication (s) are on the active medication list: yes  Last refill:  08/25/20 #90 0 refills  Future visit scheduled: yes in 4 months  Notes to clinic:  medication ordered by D. Janee Morn, MD [facility]- ED. Do you want to refill Rx?     Requested Prescriptions  Pending Prescriptions Disp Refills   olmesartan (BENICAR) 40 MG tablet [Pharmacy Med Name: OLMESARTAN MEDOXOMIL 40 MG TAB] 90 tablet 0    Sig: TAKE 1 TABLET BY MOUTH EVERY DAY      Cardiovascular:  Angiotensin Receptor Blockers Passed - 10/27/2020  5:54 PM      Passed - Cr in normal range and within 180 days    Creat  Date Value Ref Range Status  11/29/2019 0.76 0.50 - 0.99 mg/dL Final    Comment:    For patients >24 years of age, the reference limit for Creatinine is approximately 13% higher for people identified as African-American. .    Creatinine, Ser  Date Value Ref Range Status  08/20/2020 0.70 0.44 - 1.00 mg/dL Final          Passed - K in normal range and within 180 days    Potassium  Date Value Ref Range Status  08/20/2020 3.5 3.5 - 5.1 mmol/L Final  07/10/2012 3.5 3.5 - 5.1 mmol/L Final          Passed - Patient is not pregnant      Passed - Last BP in normal range    BP Readings from Last 1 Encounters:  09/13/20 130/82          Passed - Valid encounter within last 6 months    Recent Outpatient Visits           1 month ago OSA (obstructive sleep apnea)   [facility] [name], MD   4 months ago Morbid obesity [facility])   [facility] [name], MD   7 months ago Viral upper respiratory tract infection   [facility] Welford Roche D, MD   7 months ago History of CVA (cerebrovascular accident) without residual deficits   [facility] [place], Danna Hefty, MD   11 months ago Atrial fibrillation with rapid ventricular response [facility])   [facility]  [facility] [name], MD       Future Appointments             In 5 days Agbor-Etang, Arlys John, MD [facility]   In 4 months [name], MD [facility], [facility]

## 2020-10-29 ENCOUNTER — Other Ambulatory Visit: Payer: Self-pay | Admitting: Family Medicine

## 2020-10-29 DIAGNOSIS — I10 Essential (primary) hypertension: Secondary | ICD-10-CM

## 2020-10-29 NOTE — Telephone Encounter (Signed)
Next appt is 12/11/2020

## 2020-11-01 ENCOUNTER — Encounter: Payer: Self-pay | Admitting: Cardiology

## 2020-11-01 ENCOUNTER — Other Ambulatory Visit: Payer: Self-pay

## 2020-11-01 ENCOUNTER — Ambulatory Visit (INDEPENDENT_AMBULATORY_CARE_PROVIDER_SITE_OTHER): Payer: BC Managed Care – PPO | Admitting: Cardiology

## 2020-11-01 VITALS — BP 120/80 | HR 77 | Ht 68.0 in | Wt 259.0 lb

## 2020-11-01 DIAGNOSIS — I480 Paroxysmal atrial fibrillation: Secondary | ICD-10-CM

## 2020-11-01 DIAGNOSIS — Z6839 Body mass index (BMI) 39.0-39.9, adult: Secondary | ICD-10-CM | POA: Diagnosis not present

## 2020-11-01 DIAGNOSIS — I10 Essential (primary) hypertension: Secondary | ICD-10-CM | POA: Diagnosis not present

## 2020-11-01 NOTE — Progress Notes (Signed)
Cardiology Office Note:    Date:  11/01/2020   ID:  Regina Christensen, DOB 1955/12/08, MRN [id number]  PCP:  [name], MD  Cardiologist:  Debbe Odea, MD  Electrophysiologist:  None   Referring MD: [name], MD   Chief Complaint  Patient presents with   Other    3 month follow up. Meds reviewed verbally with patient.     History of Present Illness:    Regina Christensen is a 65 y.o. female with a hx of hypertension, paroxysmal A. fib on Eliquis, CVA 02/2020 (2/2 not taking eliquis), obesity, sleep apnea who presents for follow-up.    Had recent sinus surgery, requiring holding Eliquis.  Eliquis was resumed after surgery, complicated by epistaxis.  Eliquis was held for some time, recently restarted after follow-up with ENT.  Patient has been on Eliquis for over a month now.  No recurrent bleeding episodes noted.  She is planning on getting left knee surgery due to arthritis later on in the year.  Plans to see orthopedics regarding this.  She otherwise feels well, rarely has palpitations lasting a few minutes.  No dizziness, no shortness of breath, no chest pain.  Prior notes Echocardiogram 02/2020 EF 55 to 60%, mildly dilated left atrium    Past Medical History:  Diagnosis Date   Anxiety    Arthritis    Back pain    BPPV (benign paroxysmal positional vertigo)    Chronic diastolic HF (heart failure) (HCC)    Complication of anesthesia    Post-operative hypoxia requiring supplemental oxygen   Current use of long term anticoagulation    Apixaban   CVA (cerebral vascular accident) (HCC) 02/28/2020   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; imaging from [facility]   Depression    Edema of both lower extremities    GERD (gastroesophageal reflux disease)    Hypertension    IBS (irritable bowel syndrome)    Lactose intolerance    Morbid obesity with BMI of 45.0-49.9, adult (HCC)    Obesity    Osteoarthritis    PAF (paroxysmal atrial fibrillation) (HCC)     Pre-diabetes    Sleep apnea    uses CPAP machine sometimes    Type 2 diabetes mellitus without complication (HCC)    Vitamin D deficiency     Past Surgical History:  Procedure Laterality Date   ABDOMINAL HYSTERECTOMY     BREAST EXCISIONAL BIOPSY Left    ENDOMETRIAL ABLATION     x2   ENDOSCOPIC CONCHA BULLOSA RESECTION Bilateral 08/08/2020   Procedure: ENDOSCOPIC CONCHA BULLOSA RESECTION;  Surgeon: Vernie Murders, MD;  Location: [facility] ORS;  Service: ENT;  Laterality: Bilateral;   ETHMOIDECTOMY Right 08/08/2020   Procedure: ETHMOIDECTOMY, LEFT PARTIAL ETHMOIDECTOMY;  Surgeon: Vernie Murders, MD;  Location: [facility] ORS;  Service: ENT;  Laterality: Right;   IMAGE GUIDED SINUS SURGERY N/A 08/08/2020   Procedure: IMAGE GUIDED SINUS SURGERY, RIGHT FRONTAL SINUSOTOMY;  Surgeon: Vernie Murders, MD;  Location: [facility] ORS;  Service: ENT;  Laterality: N/A;   LAPAROSCOPIC SLEEVE GASTRECTOMY     MAXILLARY ANTROSTOMY Bilateral 08/08/2020   Procedure: MAXILLARY ANTROSTOMY with tissue;  Surgeon: Vernie Murders, MD;  Location: [facility] ORS;  Service: ENT;  Laterality: Bilateral;   NASAL TURBINATE REDUCTION  08/27/2020   Procedure: TURBINATE REDUCTION /SUBMUCOSAL DEBRIDEMENT;  Surgeon: Vernie Murders, MD;  Location: [facility] ORS;  Service: ENT;;   TOTAL KNEE ARTHROPLASTY Right     Current Medications: Current Meds  Medication Sig   acetaminophen (TYLENOL)  650 MG CR tablet Take 1 tablet (650 mg total) by mouth every 8 (eight) hours as needed for pain.   amLODipine (NORVASC) 10 MG tablet Take 1 tablet (10 mg total) by mouth daily.   apixaban (ELIQUIS) 5 MG TABS tablet Take 5 mg by mouth 2 (two) times daily.   famotidine (PEPCID) 20 MG tablet Take 1 tablet (20 mg total) by mouth daily.   meclizine (ANTIVERT) 12.5 MG tablet Take 1 tablet (12.5 mg total) by mouth 3 (three) times daily as needed for dizziness.   metoprolol tartrate (LOPRESSOR) 100 MG tablet Take 1 tablet (100 mg total) by mouth 2 (two) times daily.   olmesartan  (BENICAR) 40 MG tablet TAKE 1 TABLET BY MOUTH EVERY DAY   Semaglutide, 1 MG/DOSE, (OZEMPIC, 1 MG/DOSE,) 4 MG/3ML SOPN Inject 1 mg into the skin once a week.   triamcinolone (NASACORT) 55 MCG/ACT AERO nasal inhaler Place 2 sprays into the nose daily.   venlafaxine XR (EFFEXOR XR) 75 MG 24 hr capsule Take 1 capsule (75 mg total) by mouth daily with breakfast. Take along with 37.5 mg daily   venlafaxine XR (EFFEXOR-XR) 37.5 MG 24 hr capsule Take 1 capsule (37.5 mg total) by mouth daily with breakfast. Take along with 75 mg daily   Vitamin D, Ergocalciferol, (DRISDOL) 1.25 MG (50000 UNIT) CAPS capsule Take 1 capsule (50,000 Units total) by mouth every 7 (seven) days.   XIIDRA 5 % SOLN Place 1 drop into both eyes 2 (two) times daily.     Allergies:   Hydrocodone-acetaminophen, Ace inhibitors, and Hctz [hydrochlorothiazide]   Social History   Socioeconomic History   Marital status: Divorced    Spouse name: Not on file   Number of children: 2   Years of education: Not on file   Highest education level: Associate degree: occupational, Scientist, product/process development, or vocational program  Occupational History   Occupation: Retired/ work PT    Employer: [employer]  Tobacco Use   Smoking status: Never   Smokeless tobacco: Never  Building services engineer Use: Never used  Substance and Sexual Activity   Alcohol use: No    Alcohol/week: 0.0 standard drinks    Comment: rare   Drug use: No   Sexual activity: Not on file  Other Topics Concern   Not on file  Social History Narrative   Daughter lives with her    Stressed at work, needs to meet quotas on 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 There is no history of Mental illness.  Her sister has atrial fibrillation, multiple nephews have atrial fibrillation.  ROS:   Please see the history of present illness.     All other systems reviewed and are negative.  EKGs/Labs/Other Studies Reviewed:      EKG:  EKG is  ordered today.  The ekg ordered today demonstrates normal sinus rhythm  Recent Labs: 02/29/2020: ALT 13 03/01/2020: TSH 1.358 08/20/2020: BUN 8; Creatinine, Ser 0.70; Magnesium 2.1; Potassium 3.5; Sodium 141 10/03/2020: Hemoglobin 12.0; Platelets 283  Recent Lipid Panel    Component Value Date/Time   CHOL 132 03/01/2020 0306   CHOL 151 12/05/2014 1117   TRIG 69 03/01/2020 0306   HDL 69 03/01/2020 0306   HDL 74 12/05/2014 1117   CHOLHDL 1.9 03/01/2020 0306   VLDL 14 03/01/2020 0306   LDLCALC 49 03/01/2020 0306   LDLCALC 50 11/29/2019 1512    Physical Exam:    VS:  BP 120/80 (BP Location: Left Arm, Patient Position: Sitting, Cuff Size: Normal)   Pulse 77   Ht 5\' 8"  (1.727 m)   Wt 259 lb (117.5 kg)   BMI 39.38 kg/m     Wt Readings from Last 3 Encounters:  11/01/20 259 lb (117.5 kg)  09/13/20 260 lb (117.9 kg)  09/10/20 259 lb (117.5 kg)     GEN:  Well nourished, well developed in no acute distress HEENT: Normal NECK: No JVD; No carotid bruits LYMPHATICS: No lymphadenopathy CARDIAC:RRR, no murmurs, rubs, gallops RESPIRATORY:  Clear to auscultation without rales, wheezing or rhonchi  ABDOMEN: Soft, non-tender, distended MUSCULOSKELETAL:  No edema; No deformity  SKIN: Warm and dry NEUROLOGIC:  Alert and oriented x 3 PSYCHIATRIC:  Normal affect   ASSESSMENT:    1. Paroxysmal atrial fibrillation (HCC)   2. Primary hypertension   3. BMI 39.0-39.9,adult     PLAN:     Paroxysmal atrial fibrillation, currently in sinus rhythm.  CHA2DS2-VASc score of 4 (htn, gender, CVA).  Continue Eliquis 5 mg twice  daily, continue Lopressor 100 mg twice daily.  Last echo with normal systolic function, mildly dilated left atrium, impaired relaxation.  If procedure planned in the future, recommend bridging with Lovenox and restarting Eliquis as soon as possible after. History of hypertension, BP well controlled.  Continue current BP meds. Patient is morbidly obese.  Weight loss advised.   Follow-up 6 months   This note was generated in part or whole with voice recognition software. Voice recognition is usually quite accurate but there are transcription errors that can and very often do occur. I apologize for any typographical errors that were not detected and corrected.  Medication Adjustments/Labs and Tests Ordered: Current medicines are reviewed at length with the patient today.  Concerns regarding medicines are outlined above.  Orders Placed This Encounter  Procedures   EKG 12-Lead    No orders of the defined types were placed in this encounter.   Patient Instructions  Medication Instructions:  Your physician recommends that you continue on your current medications as directed. Please refer to the Current Medication list given to you today.  *If you need a refill on your cardiac medications before your next appointment, please call your pharmacy*   Lab Work: None ordered If you have labs (blood work) drawn today and your tests are completely normal, you will receive your results only by: MyChart Message (if you have MyChart) OR A paper copy in the mail If you have any lab test that is abnormal or we need to change your treatment, we will call you to review the results.   Testing/Procedures: None ordered   Follow-Up: At [facility], you and your health needs are our priority.  As part of our continuing mission to provide you with exceptional heart care, we have created designated Provider Care Teams.  These Care Teams include your primary Cardiologist (physician) and Advanced Practice  Providers (APPs -  Physician Assistants and Nurse Practitioners) who all work together to provide you with the care you need, when you need  it.  We recommend signing up for the patient portal called "MyChart".  Sign up information is provided on this After Visit Summary.  MyChart is used to connect with patients for Virtual Visits (Telemedicine).  Patients are able to view lab/test results, encounter notes, upcoming appointments, etc.  Non-urgent messages can be sent to your provider as well.   To learn more about what you can do with MyChart, go to [email or web address].    Your next appointment:   6 month(s)  The format for your next appointment:   In Person  Provider:   Debbe Odea, MD   Other Instructions    Signed, Debbe Odea, MD  11/01/2020 1:08 PM    [facility]

## 2020-11-01 NOTE — Patient Instructions (Signed)

## 2020-11-12 ENCOUNTER — Encounter: Payer: Self-pay | Admitting: Psychiatry

## 2020-11-12 ENCOUNTER — Other Ambulatory Visit: Payer: Self-pay

## 2020-11-12 ENCOUNTER — Telehealth (INDEPENDENT_AMBULATORY_CARE_PROVIDER_SITE_OTHER): Payer: BC Managed Care – PPO | Admitting: Psychiatry

## 2020-11-12 DIAGNOSIS — F5105 Insomnia due to other mental disorder: Secondary | ICD-10-CM

## 2020-11-12 DIAGNOSIS — F3342 Major depressive disorder, recurrent, in full remission: Secondary | ICD-10-CM

## 2020-11-12 DIAGNOSIS — F411 Generalized anxiety disorder: Secondary | ICD-10-CM

## 2020-11-12 MED ORDER — VENLAFAXINE HCL ER 37.5 MG PO CP24: 37.5000 mg | ORAL_CAPSULE | Freq: Every day | ORAL | 0 refills | Status: DC

## 2020-11-12 MED ORDER — VENLAFAXINE HCL ER 75 MG PO CP24: 75.0000 mg | ORAL_CAPSULE | Freq: Every day | ORAL | 0 refills | Status: DC

## 2020-11-12 NOTE — Progress Notes (Signed)
Virtual Visit via Video Note  I connected with Regina Christensen on 11/12/20 at  8:30 AM EDT by a video enabled telemedicine application and verified that I am speaking with the correct person using two identifiers.  Location Provider Location : [facility] Patient Location : Home  Participants: Patient , Provider   I discussed the limitations of evaluation and management by telemedicine and the availability of in person appointments. The patient expressed understanding and agreed to proceed.    I discussed the assessment and treatment plan with the patient. The patient was provided an opportunity to ask questions and all were answered. The patient agreed with the plan and demonstrated an understanding of the instructions.   The patient was advised to call back or seek an in-person evaluation if the symptoms worsen or if the condition fails to improve as anticipated.   [facility] MD OP Progress Note  11/12/2020 8:46 AM Regina Christensen  MRN:  [id number]  Chief Complaint:  Chief Complaint   Follow-up; Anxiety    HPI: Regina Christensen is a 65 year old female, retired, lives in [place], has a history of GAD, ADD, hypertension, gastric bypass, hyperlipidemia, OSA, arthritis was evaluated by telemedicine today.  Patient today reports she is currently doing well.  She was able to recover fully from her sinus surgery which was done in April.  Patient reports her mood symptoms are currently stable.  Denies any significant depression.  Denies any anxiety attacks or panic attacks.  Reports sleep is good.  Reports appetite is fair.  Patient reports she does have arthritis of her knee and may need surgery of her knee in the future.  She had cardiology evaluation recently-patient with paroxysmal atrial fibrillation currently is in sinus rhythm, continued on Eliquis and Lopressor 100 mg p.o. twice daily.  Patient denies any suicidality, homicidality or perceptual disturbances.  Patient denies any  other concerns today.  Visit Diagnosis:    ICD-10-CM   1. GAD (generalized anxiety disorder)  F41.1     2. MDD (major depressive disorder), recurrent, in full remission (HCC)  F33.42     3. Insomnia due to mental condition  F51.05    Mood      Past Psychiatric History: Reviewed past psychiatric history from progress note on 01/27/2019.  Past trials of Prozac, Effexor  Past Medical History:  Past Medical History:  Diagnosis Date   Anxiety    Arthritis    Back pain    BPPV (benign paroxysmal positional vertigo)    Chronic diastolic HF (heart failure) (HCC)    Complication of anesthesia    Post-operative hypoxia requiring supplemental oxygen   Current use of long term anticoagulation    Apixaban   CVA (cerebral vascular accident) (HCC) 02/28/2020   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; imaging from [facility]   Depression    Edema of both lower extremities    GERD (gastroesophageal reflux disease)    Hypertension    IBS (irritable bowel syndrome)    Lactose intolerance    Morbid obesity with BMI of 45.0-49.9, adult (HCC)    Obesity    Osteoarthritis    PAF (paroxysmal atrial fibrillation) (HCC)    Pre-diabetes    Sleep apnea    uses CPAP machine sometimes    Type 2 diabetes mellitus without complication (HCC)    Vitamin D deficiency     Past Surgical History:  Procedure Laterality Date   ABDOMINAL HYSTERECTOMY     BREAST EXCISIONAL BIOPSY Left  ENDOMETRIAL ABLATION     x2   ENDOSCOPIC CONCHA BULLOSA RESECTION Bilateral 08/08/2020   Procedure: ENDOSCOPIC CONCHA BULLOSA RESECTION;  Surgeon: Vernie Murders, MD;  Location: [facility];  Service: ENT;  Laterality: Bilateral;   ETHMOIDECTOMY Right 08/08/2020   Procedure: ETHMOIDECTOMY, LEFT PARTIAL ETHMOIDECTOMY;  Surgeon: Vernie Murders, MD;  Location: [facility];  Service: ENT;  Laterality: Right;   IMAGE GUIDED SINUS SURGERY N/A 08/08/2020   Procedure: IMAGE GUIDED SINUS SURGERY, RIGHT FRONTAL SINUSOTOMY;  Surgeon: Vernie Murders, MD;  Location: [facility];  Service: ENT;  Laterality: N/A;   LAPAROSCOPIC SLEEVE GASTRECTOMY     MAXILLARY ANTROSTOMY Bilateral 08/08/2020   Procedure: MAXILLARY ANTROSTOMY with tissue;  Surgeon: Vernie Murders, MD;  Location: [facility];  Service: ENT;  Laterality: Bilateral;   NASAL TURBINATE REDUCTION  08/27/2020   Procedure: TURBINATE REDUCTION /SUBMUCOSAL DEBRIDEMENT;  Surgeon: Vernie Murders, MD;  Location: [facility];  Service: ENT;;   TOTAL KNEE ARTHROPLASTY Right     Family Psychiatric History: Reviewed family psychiatric history from progress note on 01/27/2019  Family History:  Family History  Problem Relation Age of Onset   Diabetes Mother    High blood pressure Mother    Heart disease Father    Cancer Brother    Mental illness Neg Hx     Social History: Reviewed social history from progress note on 10/08/ 2020 Social History   Socioeconomic History   Marital status: Divorced    Spouse name: Not on file   Number of children: 2   Years of education: Not on file   Highest education level: Associate degree: occupational, Scientist, product/process development, or vocational program  Occupational History   Occupation: Retired/ work PT    Employer: [employer]  Tobacco Use   Smoking status: Never   Smokeless tobacco: Never  Building services engineer Use: Never used  Substance and Sexual Activity   Alcohol use: No    Alcohol/week: 0.0 standard drinks    Comment: rare   Drug use: No   Sexual activity: Not on file  Other Topics Concern   Not on file  Social History Narrative   Daughter lives with her    Stressed at work, needs to meet quotas on her first 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 )    Social Determinants of Health   Financial Resource Strain: Not on file  Food Insecurity: Not on file  Transportation Needs: Not on file  Physical Activity: Not on file  Stress: Not on file  Social Connections: Not on file    Allergies:  Allergies  Allergen Reactions    Hydrocodone-Acetaminophen Other (See Comments)    Extreme headache   Ace Inhibitors Cough   Hctz [Hydrochlorothiazide] Rash    face    Metabolic Disorder Labs: Lab Results  Component Value Date   HGBA1C 6.0 (H) 08/16/2020   MPG 125.5 08/16/2020   MPG 131.24 03/01/2020   No results found for: PROLACTIN Lab Results  Component Value Date   CHOL 132 03/01/2020   TRIG 69 03/01/2020   HDL 69 03/01/2020   CHOLHDL 1.9 03/01/2020   VLDL 14 03/01/2020   LDLCALC 49 03/01/2020   LDLCALC 50 11/29/2019   Lab Results  Component Value Date   TSH 1.358 03/01/2020   TSH 1.05 11/29/2019    Therapeutic Level Labs: No results found for: LITHIUM No results found for: VALPROATE No components found for:  CBMZ  Current Medications: Current Outpatient Medications  Medication  Sig Dispense Refill   acetaminophen (TYLENOL) 650 MG CR tablet Take 1 tablet (650 mg total) by mouth every 8 (eight) hours as needed for pain.     amLODipine (NORVASC) 10 MG tablet Take 1 tablet (10 mg total) by mouth daily. 90 tablet 1   apixaban (ELIQUIS) 5 MG TABS tablet Take 5 mg by mouth 2 (two) times daily.     famotidine (PEPCID) 20 MG tablet Take 1 tablet (20 mg total) by mouth daily. 90 tablet 1   meclizine (ANTIVERT) 12.5 MG tablet Take 1 tablet (12.5 mg total) by mouth 3 (three) times daily as needed for dizziness. 30 tablet 0   metoprolol tartrate (LOPRESSOR) 100 MG tablet Take 1 tablet (100 mg total) by mouth 2 (two) times daily. 180 tablet 0   olmesartan (BENICAR) 40 MG tablet TAKE 1 TABLET BY MOUTH EVERY DAY 90 tablet 0   Semaglutide, 1 MG/DOSE, (OZEMPIC, 1 MG/DOSE,) 4 MG/3ML SOPN Inject 1 mg into the skin once a week. 9 mL 1   triamcinolone (NASACORT) 55 MCG/ACT AERO nasal inhaler Place 2 sprays into the nose daily.     venlafaxine XR (EFFEXOR XR) 75 MG 24 hr capsule Take 1 capsule (75 mg total) by mouth daily with breakfast. Take along with 37.5 mg daily 90 capsule 0   venlafaxine XR (EFFEXOR-XR) 37.5 MG  24 hr capsule Take 1 capsule (37.5 mg total) by mouth daily with breakfast. Take along with 75 mg daily 90 capsule 0   Vitamin D, Ergocalciferol, (DRISDOL) 1.25 MG (50000 UNIT) CAPS capsule Take 1 capsule (50,000 Units total) by mouth every 7 (seven) days. 4 capsule 0   XIIDRA 5 % SOLN Place 1 drop into both eyes 2 (two) times daily.  4   No current facility-administered medications for this visit.     Musculoskeletal: Strength & Muscle Tone:  UTA Gait & Station: normal Patient leans: N/A  Psychiatric Specialty Exam: Review of Systems  Psychiatric/Behavioral:  Negative for agitation, behavioral problems, confusion, decreased concentration, dysphoric mood, hallucinations, self-injury, sleep disturbance and suicidal ideas. The patient is not nervous/anxious and is not hyperactive.   All other systems reviewed and are negative.  There were no vitals taken for this visit.There is no height or weight on file to calculate BMI.  General Appearance: Casual  Eye Contact:  Fair  Speech:  Clear and Coherent  Volume:  Normal  Mood:  Euthymic  Affect:  Congruent  Thought Process:  Goal Directed and Descriptions of Associations: Intact  Orientation:  Full (Time, Place, and Person)  Thought Content: Logical   Suicidal Thoughts:  No  Homicidal Thoughts:  No  Memory:  Immediate;   Fair Recent;   Fair Remote;   Fair  Judgement:  Fair  Insight:  Fair  Psychomotor Activity:  Normal  Concentration:  Concentration: Good and Attention Span: Good  Recall:  Good  Fund of Knowledge: Good  Language: Fair  Akathisia:  No  Handed:  Right  AIMS (if indicated): not done  Assets:  Communication Skills Desire for Improvement Housing Resilience Social Support  ADL's:  Intact  Cognition: WNL  Sleep:  Fair   Screenings: GAD-7    Flowsheet Row Video Visit from 09/04/2020 in [facility] Office Visit from 01/27/2019 in [facility] Visit  from 01/21/2019 in [facility] Visit from 06/21/2018 in [facility] Office Visit from 04/26/2018 in [facility]  Total GAD-7 Score 2 10 6  0 0      PHQ2-9    Flowsheet Row [facility]12[facility]2020 [facility] Video Visit from 09/04/2020 [facility] Video Visit from [facility]18/2020 [facility]  PHQ-2 Total Score 0 0 0 0 0  PHQ-9 Total Score 1 -- 1 -- 2      Flowsheet Row Video Visit from [facility]/04/2020 in [facility] Pre-Admission Testing 45 from [facility]/24/2020 [facility] PRE [facility] TEST[facility] ED to Hosp-Admission (Discharged) from [facility]15/2020 [facility]  C-SSRS RISK CATEGORY No Risk No Risk No Risk        Assessment and Plan: Regina QuaJosephine S Christensen is a 65 year old African-American female, divorced, retired, [place] has a history of anxiety, depression, hypertension, hyperlipidemia, arthritis , paroxysmal atrial fibrillation currently in sinus rhythm was evaluated by telemedicine today.  Patient is currently stable.  Plan as noted below.  Plan GAD-stable Venlafaxine extended release 112.5 mg p.o. daily   MDD in remission Venlafaxine as prescribed  Insomnia-stable Melatonin 5 mg p.o. nightly Continue sleep hygiene techniques  Follow-up in clinic in 3 months or sooner as needed.  This note was generated in part or whole with voice recognition software. Voice recognition is usually quite accurate but there are transcription errors that can and very often do occur. I apologize for any typographical errors that were not detected and corrected.       Jomarie LongsSaramma Davari Lopes, MD 11/12/2020, 8:46 AM

## 2020-11-14 ENCOUNTER — Ambulatory Visit (INDEPENDENT_AMBULATORY_CARE_PROVIDER_SITE_OTHER): Payer: BC Managed Care – PPO | Admitting: Family Medicine

## 2020-11-14 ENCOUNTER — Other Ambulatory Visit: Payer: Self-pay

## 2020-11-14 ENCOUNTER — Encounter (INDEPENDENT_AMBULATORY_CARE_PROVIDER_SITE_OTHER): Payer: Self-pay | Admitting: Family Medicine

## 2020-11-14 VITALS — BP 137/81 | HR 73 | Temp 97.7°F | Ht 66.0 in | Wt 255.0 lb

## 2020-11-14 DIAGNOSIS — E65 Localized adiposity: Secondary | ICD-10-CM | POA: Diagnosis not present

## 2020-11-14 DIAGNOSIS — E1169 Type 2 diabetes mellitus with other specified complication: Secondary | ICD-10-CM

## 2020-11-14 DIAGNOSIS — Z9189 Other specified personal risk factors, not elsewhere classified: Secondary | ICD-10-CM

## 2020-11-14 DIAGNOSIS — I480 Paroxysmal atrial fibrillation: Secondary | ICD-10-CM

## 2020-11-14 DIAGNOSIS — Z903 Acquired absence of stomach [part of]: Secondary | ICD-10-CM

## 2020-11-14 DIAGNOSIS — E538 Deficiency of other specified B group vitamins: Secondary | ICD-10-CM

## 2020-11-14 DIAGNOSIS — E559 Vitamin D deficiency, unspecified: Secondary | ICD-10-CM | POA: Diagnosis not present

## 2020-11-14 DIAGNOSIS — R5383 Other fatigue: Secondary | ICD-10-CM

## 2020-11-14 DIAGNOSIS — E8881 Metabolic syndrome: Secondary | ICD-10-CM | POA: Diagnosis not present

## 2020-11-14 DIAGNOSIS — R5381 Other malaise: Secondary | ICD-10-CM

## 2020-11-14 DIAGNOSIS — Z6841 Body Mass Index (BMI) 40.0 and over, adult: Secondary | ICD-10-CM

## 2020-11-14 MED ORDER — VITAMIN D (ERGOCALCIFEROL) 1.25 MG (50000 UNIT) PO CAPS: 50000.0000 [IU] | ORAL_CAPSULE | ORAL | 0 refills | Status: DC

## 2020-11-14 MED ORDER — OZEMPIC (2 MG/DOSE) 8 MG/3ML ~~LOC~~ SOPN: 2.0000 mg | PEN_INJECTOR | SUBCUTANEOUS | 0 refills | Status: DC

## 2020-11-15 LAB — CBC WITH DIFFERENTIAL/PLATELET
Basophils Absolute: 0.1 10*3/uL (ref 0.0–0.2)
Basos: 1 %
EOS (ABSOLUTE): 0.1 10*3/uL (ref 0.0–0.4)
Eos: 1 %
Hematocrit: 40.9 % (ref 34.0–46.6)
Hemoglobin: 12.9 g/dL (ref 11.1–15.9)
Immature Grans (Abs): 0 10*3/uL (ref 0.0–0.1)
Immature Granulocytes: 0 %
Lymphocytes Absolute: 1.5 10*3/uL (ref 0.7–3.1)
Lymphs: 16 %
MCH: 28.2 pg (ref 26.6–33.0)
MCHC: 31.5 g/dL (ref 31.5–35.7)
MCV: 90 fL (ref 79–97)
Monocytes Absolute: 0.6 10*3/uL (ref 0.1–0.9)
Monocytes: 6 %
Neutrophils Absolute: 7.4 10*3/uL — ABNORMAL HIGH (ref 1.4–7.0)
Neutrophils: 76 %
Platelets: 342 10*3/uL (ref 150–450)
RBC: 4.57 x10E6/uL (ref 3.77–5.28)
RDW: 13.9 % (ref 11.7–15.4)
WBC: 9.7 10*3/uL (ref 3.4–10.8)

## 2020-11-15 LAB — COMPREHENSIVE METABOLIC PANEL
ALT: 9 IU/L (ref 0–32)
AST: 9 IU/L (ref 0–40)
Albumin/Globulin Ratio: 1.4 (ref 1.2–2.2)
Albumin: 4.1 g/dL (ref 3.8–4.8)
Alkaline Phosphatase: 111 IU/L (ref 44–121)
BUN/Creatinine Ratio: 21 (ref 12–28)
BUN: 14 mg/dL (ref 8–27)
Bilirubin Total: <0.2 mg/dL (ref 0.0–1.2)
CO2: 24 mmol/L (ref 20–29)
Calcium: 9.2 mg/dL (ref 8.7–10.3)
Chloride: 101 mmol/L (ref 96–106)
Creatinine, Ser: 0.68 mg/dL (ref 0.57–1.00)
Globulin, Total: 3 g/dL (ref 1.5–4.5)
Glucose: 80 mg/dL (ref 65–99)
Potassium: 3.7 mmol/L (ref 3.5–5.2)
Sodium: 142 mmol/L (ref 134–144)
Total Protein: 7.1 g/dL (ref 6.0–8.5)
eGFR: 97 mL/min/{1.73_m2} (ref >59)

## 2020-11-15 LAB — IRON,TIBC AND FERRITIN PANEL
Ferritin: 25 ng/mL (ref 15–150)
Iron Saturation: 12 % — ABNORMAL LOW (ref 15–55)
Iron: 39 ug/dL (ref 27–139)
Total Iron Binding Capacity: 329 ug/dL (ref 250–450)
UIBC: 290 ug/dL (ref 118–369)

## 2020-11-15 LAB — VITAMIN D 25 HYDROXY (VIT D DEFICIENCY, FRACTURES): Vit D, 25-Hydroxy: 37.3 ng/mL (ref 30.0–100.0)

## 2020-11-15 LAB — TSH: TSH: 1.25 u[IU]/mL (ref 0.450–4.500)

## 2020-11-15 LAB — VITAMIN B12: Vitamin B-12: 804 pg/mL (ref 232–1245)

## 2020-11-20 NOTE — Progress Notes (Signed)
Chief Complaint:   OBESITY Regina Christensen is here to discuss her progress with her obesity treatment plan along with follow-up of her obesity related diagnoses.   Today's visit was #: 6 Starting weight: 255 lbs Starting date: 05/10/2020 Today's weight: 255 lbs Today's date: 11/14/2020 Weight change since last visit: 6 lbs Total lbs lost to date: 0 Body mass index is 41.16 kg/m.   Interim History:    Current Meal Plan: the Category 1 Plan for 50-60% of the time.  Current Exercise Plan: None. Current Anti-Obesity Medications: Ozempic 1 mg subcutaneously weekly. Side effects: None.  Assessment/Plan:   Orders Placed This Encounter  Procedures   Iron, TIBC and Ferritin Panel   CBC with Differential/Platelet   Comprehensive metabolic panel   TSH   Vitamin B12   VITAMIN D 25 Hydroxy (Vit-D Deficiency, Fractures)   Medications Discontinued During This Encounter  Medication Reason   Vitamin D, Ergocalciferol, (DRISDOL) 1.25 MG (50000 UNIT) CAPS capsule Reorder   Semaglutide, 1 MG/DOSE, (OZEMPIC, 1 MG/DOSE,) 4 MG/3ML SOPN     1. Type 2 diabetes mellitus with other specified complication, without long-term current use of insulin (HCC) Diabetes Mellitus: Not at goal. Medication: Ozempic 1 mg subcutaneously weekly.  Plan: Increase Ozempic to 2 mg subcutaneously weekly.  Will check CMP today. The patient will continue to focus on protein-rich, low simple carbohydrate foods. We reviewed the importance of hydration, regular exercise for stress reduction, and restorative sleep.   Lab Results  Component Value Date   HGBA1C 6.0 (H) 08/16/2020   HGBA1C 6.2 (H) 03/01/2020   HGBA1C 6.3 (H) 11/29/2019   Lab Results  Component Value Date   LDLCALC 49 03/01/2020   CREATININE 0.68 11/14/2020   - Comprehensive metabolic panel - Increase and refill Semaglutide, 2 MG/DOSE, (OZEMPIC, 2 MG/DOSE,) 8 MG/3ML SOPN; Inject 2 mg into the skin once a week.  Dispense: 3 mL; Refill: 0  2. Vitamin D  deficiency Not at goal. She is taking vitamin D 50,000 IU weekly.  Plan: Continue to take prescription Vitamin D @50 ,000 IU every week as prescribed.  Will check vitamin D level today.  Lab Results  Component Value Date   VD25OH 37.3 11/14/2020   - Refill Vitamin D, Ergocalciferol, (DRISDOL) 1.25 MG (50000 UNIT) CAPS capsule; Take 1 capsule (50,000 Units total) by mouth every 7 (seven) days.  Dispense: 4 capsule; Refill: 0 - VITAMIN D 25 Hydroxy (Vit-D Deficiency, Fractures)  3. Visceral obesity Current visceral fat rating: 17. Visceral fat rating should be < 13. Visceral adipose tissue is a hormonally active component of total body fat. This body composition phenotype is associated with medical disorders such as metabolic syndrome, cardiovascular disease and several malignancies including prostate, breast, and colorectal cancers. Starting goal: Lose 7-10% of starting weight.   4. Metabolic syndrome Starting goal: Lose 7-10% of starting weight. She will continue to focus on protein-rich, low simple carbohydrate foods. We reviewed the importance of hydration, regular exercise for stress reduction, and restorative sleep.  We will continue to check lab work every 3 months, with 10% weight loss, or should any other concerns arise.  5. AF (paroxysmal atrial fibrillation) (HCC) Korene is taking Eliquis 5 mg twice daily.  She is followed by Cardiology.  6. B12 deficiency Lab Results  Component Value Date   VITAMINB12 804 11/14/2020   Will check vitamin B12 level today, as per below.  - Vitamin B12  7. Malaise and fatigue Will check labs today, as per below.  -  Iron, TIBC and Ferritin Panel - CBC with Differential/Platelet - Comprehensive metabolic panel - TSH  8. History of sleeve gastrectomy Aizza is at risk for malnutrition due to her previous bariatric surgery.   Counseling You may need to eat 3 meals and 2 snacks, or 5 small meals each day in order to reach your protein  and calorie goals.  Allow at least 15 minutes for each meal so that you can eat mindfully. Listen to your body so that you do not overeat. For most people, your sleeve or pouch will comfortably hold 4-6 ounces. Eat foods from all food groups. This includes fruits and vegetables, grains, dairy, and meat and other proteins. Include a protein-rich food at every meal and snack, and eat the protein food first.  You should be taking a Bariatric Multivitamin as well as calcium.   9. At risk for heart disease Due to Shianna's current state of health and medical condition(s), she is at a higher risk for heart disease.  This puts the patient at much greater risk to subsequently develop cardiopulmonary conditions that can significantly affect patient's quality of life in a negative manner.    At least 8 minutes were spent on counseling Nikhita about these concerns today. Evidence-based interventions for health behavior change were utilized today including the discussion of self monitoring techniques, problem-solving barriers, and SMART goal setting techniques.  Specifically, regarding patient's less desirable eating habits and patterns, we employed the technique of small changes when Chasitie has not been able to fully commit to her prudent nutritional plan.  10. Obesity, current BMI 41.2  Course: Dreamer is currently in the action stage of change. As such, her goal is to continue with weight loss efforts.   Nutrition goals: She has agreed to the Category 1 Plan.   Exercise goals: All adults should avoid inactivity. Some physical activity is better than none, and adults who participate in any amount of physical activity gain some health benefits.  Behavioral modification strategies: increasing lean protein intake, decreasing simple carbohydrates, increasing vegetables, and increasing water intake.  Sharmon has agreed to follow-up with our clinic in 4 weeks. She was informed of the importance of  frequent follow-up visits to maximize her success with intensive lifestyle modifications for her multiple health conditions.   Objective:   Blood pressure 137/81, pulse 73, temperature 97.7 F (36.5 C), temperature source Oral, height 5\' 6"  (1.676 m), weight 255 lb (115.7 kg), SpO2 96 %. Body mass index is 41.16 kg/m.  General: Cooperative, alert, well developed, in no acute distress. HEENT: Conjunctivae and lids unremarkable. Cardiovascular: Regular rhythm.  Lungs: Normal work of breathing. Neurologic: No focal deficits.   Lab Results  Component Value Date   CREATININE 0.68 11/14/2020   BUN 14 11/14/2020   NA 142 11/14/2020   K 3.7 11/14/2020   CL 101 11/14/2020   CO2 24 11/14/2020   Lab Results  Component Value Date   ALT 9 11/14/2020   AST 9 11/14/2020   ALKPHOS 111 11/14/2020   BILITOT <0.2 11/14/2020   Lab Results  Component Value Date   HGBA1C 6.0 (H) 08/16/2020   HGBA1C 6.2 (H) 03/01/2020   HGBA1C 6.3 (H) 11/29/2019   HGBA1C 6.3 (H) 02/03/2019   HGBA1C 5.8 (H) 02/05/2018   Lab Results  Component Value Date   TSH 1.250 11/14/2020   Lab Results  Component Value Date   CHOL 132 03/01/2020   HDL 69 03/01/2020   LDLCALC 49 03/01/2020   TRIG 69 03/01/2020  CHOLHDL 1.9 03/01/2020   Lab Results  Component Value Date   VD25OH 37.3 11/14/2020   Lab Results  Component Value Date   WBC 9.7 11/14/2020   HGB 12.9 11/14/2020   HCT 40.9 11/14/2020   MCV 90 11/14/2020   PLT 342 11/14/2020   Lab Results  Component Value Date   IRON 39 11/14/2020   TIBC 329 11/14/2020   FERRITIN 25 11/14/2020   Attestation Statements:   Reviewed by clinician on day of visit: allergies, medications, problem list, medical history, surgical history, family history, social history, and previous encounter notes.  I, Insurance claims handler, CMA, am acting as transcriptionist for Helane Rima, DO  I have reviewed the above documentation for accuracy and completeness, and I agree with  the above. Helane Rima, DO

## 2020-11-30 ENCOUNTER — Other Ambulatory Visit: Payer: Self-pay | Admitting: Family Medicine

## 2020-11-30 DIAGNOSIS — I10 Essential (primary) hypertension: Secondary | ICD-10-CM

## 2020-12-10 ENCOUNTER — Other Ambulatory Visit: Payer: Self-pay

## 2020-12-10 ENCOUNTER — Ambulatory Visit: Payer: BC Managed Care – PPO | Admitting: Family Medicine

## 2020-12-10 ENCOUNTER — Encounter: Payer: Self-pay | Admitting: Family Medicine

## 2020-12-10 VITALS — BP 130/72 | HR 89 | Temp 98.0°F | Resp 18 | Ht 66.0 in | Wt 258.1 lb

## 2020-12-10 DIAGNOSIS — L819 Disorder of pigmentation, unspecified: Secondary | ICD-10-CM

## 2020-12-10 NOTE — Progress Notes (Signed)
   SUBJECTIVE:   CHIEF COMPLAINT / HPI:   Spot on knee - Noticed a discolored patch on her knee for the past few months. Doesn't bother her. Doesn't itch, hurt. No rashes, blisters, redness, swelling. - Had steroid injection in May and used biofreeze on knee in June  - denies new creams, lotions, soaps.  - undergoing knee replacement surgery in October  OBJECTIVE:   BP 130/72   Pulse 89   Temp 98 F (36.7 C) (Oral)   Resp 18   Ht 5\' 6"  (1.676 m)   Wt 258 lb 1.6 oz (117.1 kg)   SpO2 98%   BMI 41.66 kg/m   Gen: well appearing, in NAD Skin: ~1.5cm area of hypopigmentation to anterior lateral L knee without erythema, swelling, blisters, rash. No scale or peeling.  ASSESSMENT/PLAN:   Skin hypopigmentation Likely 2/2 steroid injection given timing of symptoms and location on knee. No symptoms or exam findings to concern for infection, allergic reaction. Reassurance provided. Return precautions discussed.    , DO

## 2020-12-10 NOTE — Patient Instructions (Signed)
The spot on your knee is likely from your steroid injection and nothing to be concerned about. If it changes, let us know.

## 2020-12-12 ENCOUNTER — Other Ambulatory Visit (INDEPENDENT_AMBULATORY_CARE_PROVIDER_SITE_OTHER): Payer: Self-pay | Admitting: Family Medicine

## 2020-12-12 DIAGNOSIS — E559 Vitamin D deficiency, unspecified: Secondary | ICD-10-CM

## 2020-12-18 ENCOUNTER — Other Ambulatory Visit: Payer: Self-pay

## 2020-12-18 ENCOUNTER — Encounter (INDEPENDENT_AMBULATORY_CARE_PROVIDER_SITE_OTHER): Payer: Self-pay | Admitting: Family Medicine

## 2020-12-18 ENCOUNTER — Ambulatory Visit (INDEPENDENT_AMBULATORY_CARE_PROVIDER_SITE_OTHER): Payer: BC Managed Care – PPO | Admitting: Family Medicine

## 2020-12-18 VITALS — BP 144/82 | HR 74 | Temp 97.9°F | Ht 66.0 in | Wt 251.0 lb

## 2020-12-18 DIAGNOSIS — E66813 Obesity, class 3: Secondary | ICD-10-CM

## 2020-12-18 DIAGNOSIS — E1159 Type 2 diabetes mellitus with other circulatory complications: Secondary | ICD-10-CM

## 2020-12-18 DIAGNOSIS — Z6841 Body Mass Index (BMI) 40.0 and over, adult: Secondary | ICD-10-CM

## 2020-12-18 DIAGNOSIS — E559 Vitamin D deficiency, unspecified: Secondary | ICD-10-CM | POA: Diagnosis not present

## 2020-12-18 DIAGNOSIS — E65 Localized adiposity: Secondary | ICD-10-CM

## 2020-12-18 DIAGNOSIS — Z9189 Other specified personal risk factors, not elsewhere classified: Secondary | ICD-10-CM

## 2020-12-18 DIAGNOSIS — E1169 Type 2 diabetes mellitus with other specified complication: Secondary | ICD-10-CM | POA: Diagnosis not present

## 2020-12-18 DIAGNOSIS — I152 Hypertension secondary to endocrine disorders: Secondary | ICD-10-CM

## 2020-12-18 MED ORDER — OZEMPIC (2 MG/DOSE) 8 MG/3ML ~~LOC~~ SOPN: 2.0000 mg | PEN_INJECTOR | SUBCUTANEOUS | 0 refills | Status: DC

## 2020-12-18 MED ORDER — VITAMIN D (ERGOCALCIFEROL) 1.25 MG (50000 UNIT) PO CAPS: 50000.0000 [IU] | ORAL_CAPSULE | ORAL | 0 refills | Status: DC

## 2020-12-18 NOTE — Progress Notes (Signed)
Chief Complaint:   OBESITY Regina Christensen is here to discuss her progress with her obesity treatment plan along with follow-up of her obesity related diagnoses.   Today's visit was #: 7 Starting weight: 255 lbs Starting date: 05/10/2020 Today's weight: 251 lbs Today's date: 12/18/2020 Weight change since last visit: 4 lbs Total lbs lost to date: 4 lbs Body mass index is 40.51 kg/m.  Total weight loss percentage to date: -1.57%  Current Meal Plan: the Category 1 Plan for 75% of the time.  Current Exercise Plan: None. Current Anti-Obesity Medications: Ozempic 2 mg subcutaneously weekly. Side effects: None.  Interim History:  Regina Christensen says she is still having cravings.  She is tolerating Ozempic 2 mg.  She reports her sleep is poor.  She says she has been drinking more sugar-sweetened beverages.  Assessment/Plan:   1. Vitamin D deficiency Not at goal.  She is taking vitamin D 50,000 IU weekly.  Plan: Continue to take prescription Vitamin D @50 ,000 IU every week as prescribed.  Follow-up for routine testing of Vitamin D, at least 2-3 times per year to avoid over-replacement.  Lab Results  Component Value Date   VD25OH 37.3 11/14/2020   - Refill Vitamin D, Ergocalciferol, (DRISDOL) 1.25 MG (50000 UNIT) CAPS capsule; Take 1 capsule (50,000 Units total) by mouth every 7 (seven) days.  Dispense: 4 capsule; Refill: 0  2. Type 2 diabetes mellitus with other specified complication, without long-term current use of insulin (HCC) Diabetes Mellitus: Not at goal. Medication: Ozempic 2 mg subcutaneously weekly.   Plan:  Continue Ozempic at current dose.  Refilled today. The patient will continue to focus on protein-rich, low simple carbohydrate foods. We reviewed the importance of hydration, regular exercise for stress reduction, and restorative sleep.   Lab Results  Component Value Date   HGBA1C 6.0 (H) 08/16/2020   HGBA1C 6.2 (H) 03/01/2020   HGBA1C 6.3 (H) 11/29/2019   Lab Results   Component Value Date   LDLCALC 49 03/01/2020   CREATININE 0.68 11/14/2020   - Refill Semaglutide, 2 MG/DOSE, (OZEMPIC, 2 MG/DOSE,) 8 MG/3ML SOPN; Inject 2 mg into the skin once a week.  Dispense: 3 mL; Refill: 0  3. Hypertension associated with type 2 diabetes mellitus (HCC) Elevated. Medications: Norvasc 10 mg daily, metoprolol 100 mg daily, Benicar 40 mg daily.   Plan: Avoid buying foods that are: processed, frozen, or prepackaged to avoid excess salt. We will watch for signs of hypotension as she continues lifestyle modifications.  BP Readings from Last 3 Encounters:  12/18/20 (!) 144/82  12/10/20 130/72  11/14/20 137/81   Lab Results  Component Value Date   CREATININE 0.68 11/14/2020   4. Visceral obesity Current visceral fat rating: 17. Visceral fat rating should be < 13. Visceral adipose tissue is a hormonally active component of total body fat. This body composition phenotype is associated with medical disorders such as metabolic syndrome, cardiovascular disease and several malignancies including prostate, breast, and colorectal cancers. Starting goal: Lose 7-10% of starting weight.   5. At risk for heart disease Due to Regina Christensen's current state of health and medical condition(s), she is at a higher risk for heart disease.  This puts the patient at much greater risk to subsequently develop cardiopulmonary conditions that can significantly affect patient's quality of life in a negative manner.    At least 8 minutes were spent on counseling Regina Christensen about these concerns today. Evidence-based interventions for health behavior change were utilized today including the discussion of self  monitoring techniques, problem-solving barriers, and SMART goal setting techniques.  Specifically, regarding patient's less desirable eating habits and patterns, we employed the technique of small changes when Regina Christensen has not been able to fully commit to her prudent nutritional plan.  6. Obesity,  current BMI 40.6  Course: Regina Christensen is currently in the action stage of change. As such, her goal is to continue with weight loss efforts.   Nutrition goals: She has agreed to the Category 1 Plan.   Exercise goals:  Will "chase after the 65-year-old more often".  Behavioral modification strategies: increasing lean protein intake, decreasing simple carbohydrates, increasing vegetables, and increasing water intake.  Regina Christensen has agreed to follow-up with our clinic in 4 weeks. She was informed of the importance of frequent follow-up visits to maximize her success with intensive lifestyle modifications for her multiple health conditions.   Objective:   Blood pressure (!) 144/82, pulse 74, temperature 97.9 F (36.6 C), temperature source Oral, height 5\' 6"  (1.676 m), weight 251 lb (113.9 kg), SpO2 96 %. Body mass index is 40.51 kg/m.  General: Cooperative, alert, well developed, in no acute distress. HEENT: Conjunctivae and lids unremarkable. Cardiovascular: Regular rhythm.  Lungs: Normal work of breathing. Neurologic: No focal deficits.   Lab Results  Component Value Date   CREATININE 0.68 11/14/2020   BUN 14 11/14/2020   NA 142 11/14/2020   K 3.7 11/14/2020   CL 101 11/14/2020   CO2 24 11/14/2020   Lab Results  Component Value Date   ALT 9 11/14/2020   AST 9 11/14/2020   ALKPHOS 111 11/14/2020   BILITOT <0.2 11/14/2020   Lab Results  Component Value Date   HGBA1C 6.0 (H) 08/16/2020   HGBA1C 6.2 (H) 03/01/2020   HGBA1C 6.3 (H) 11/29/2019   HGBA1C 6.3 (H) 02/03/2019   HGBA1C 5.8 (H) 02/05/2018   Lab Results  Component Value Date   TSH 1.250 11/14/2020   Lab Results  Component Value Date   CHOL 132 03/01/2020   HDL 69 03/01/2020   LDLCALC 49 03/01/2020   TRIG 69 03/01/2020   CHOLHDL 1.9 03/01/2020   Lab Results  Component Value Date   VD25OH 37.3 11/14/2020   Lab Results  Component Value Date   WBC 9.7 11/14/2020   HGB 12.9 11/14/2020   HCT 40.9  11/14/2020   MCV 90 11/14/2020   PLT 342 11/14/2020   Lab Results  Component Value Date   IRON 39 11/14/2020   TIBC 329 11/14/2020   FERRITIN 25 11/14/2020   Attestation Statements:   Reviewed by clinician on day of visit: allergies, medications, problem list, medical history, surgical history, family history, social history, and previous encounter notes.  I, 11/16/2020, CMA, am acting as transcriptionist for Insurance claims handler, DO  I have reviewed the above documentation for accuracy and completeness, and I agree with the above. Helane Rima, DO

## 2020-12-21 ENCOUNTER — Telehealth: Payer: Self-pay | Admitting: Cardiology

## 2020-12-21 NOTE — Telephone Encounter (Signed)
   [facility] Pre-operative Risk Assessment    Patient Name: Regina Christensen  DOB: 03-17-1956 MRN: [id number]  HEARTCARE STAFF:  - IMPORTANT!!!!!! Under Visit Info/Reason for Call, type in Other and utilize the format Clearance MM/DD/YY or Clearance TBD. Do not use dashes or single digits. - Please review there is not already an duplicate clearance open for this procedure. - If request is for dental extraction, please clarify the # of teeth to be extracted. - If the patient is currently at the dentist's office, call Pre-Op Callback Staff (MA/nurse) to input urgent request.  - If the patient is not currently in the dentist office, please route to the Pre-Op pool.  Request for surgical clearance:  What type of surgery is being performed? LT TKA Traditional knee   When is this surgery scheduled? 01/24/21  What type of clearance is required (medical clearance vs. Pharmacy clearance to hold med vs. Both)? both  Are there any medications that need to be held prior to surgery and how long? Not listed, please advise if needed  Practice name and name of physician performing surgery? [facility] - Dr Kurtis Bushman   What is the office phone number? [phone] ext [phone]   7.   What is the office fax number? [phone]  8.   Anesthesia type (None, local, MAC, general) ? Local Regina Christensen    Regina Christensen 12/21/2020, 3:00 PM  _________________________________________________________________   (provider comments below)

## 2020-12-21 NOTE — Telephone Encounter (Signed)
Please advise on holding Eliquis prior to left TKA traditional knee.  Surgery planned for 01/24/2021.  Thank you.

## 2020-12-25 NOTE — Telephone Encounter (Signed)
Left VM

## 2020-12-25 NOTE — Telephone Encounter (Signed)
Patient with diagnosis of afib on Eliquis for anticoagulation.    Procedure: LT TKA Traditional knee  Date of procedure: 01/24/21  CHA2DS2-VASc Score = 6   This indicates a 9.7% annual risk of stroke. The patient's score is based upon: CHF History: Yes HTN History: Yes Diabetes History: Yes Stroke History: Yes Vascular Disease History: No Age Score: 0 Gender Score: 1      CrCl 107 ml/min Platelet count 342  Per office protocol, patient can hold Eliquis for 3 days prior to procedure.   Per Dr. Zandra Abts note of 7/14, patient will need bridge with lovenox.  She has been previously bridged.  Please have patient let us know when she knows the time of the procedure or arrival time so we can give bridging instructions.

## 2020-12-26 NOTE — Telephone Encounter (Signed)
Patient returning call from the office.

## 2020-12-27 NOTE — Telephone Encounter (Signed)
   Name: Regina Christensen  DOB: 04/26/1955  MRN: [id number]   Primary Cardiologist: Debbe Odea, MD  Chart reviewed as part of pre-operative protocol coverage. Patient was contacted 12/27/2020 in reference to pre-operative risk assessment for pending surgery as outlined below.  Regina Christensen was last seen on 11/01/20 by Dr. Azucena Cecil.  Since that day, Regina Christensen has done. She can complete 4.0 METS (groceries, moderate housework) without angina. She does not have a history of ischemic heart disease.   Per our clinical pharmacist: Per office protocol, patient can hold Eliquis for 3 days prior to procedure.   Per Dr. Zandra Abts note of 7/14, patient will need bridge with lovenox.  She has been previously bridged.   Please have patient let us know when she knows the time of the procedure or arrival time so we can give bridging instructions.   She states surgery is "middle of the day." She states she will not know the time of the surgery until the day before.   Therefore, based on ACC/AHA guidelines, the patient would be at acceptable risk for the planned procedure without further cardiovascular testing.   The patient was advised that if she develops new symptoms prior to surgery to contact our office to arrange for a follow-up visit, and she verbalized understanding.  I will route this recommendation to the requesting party via Epic fax function and remove from pre-op pool. Please call with questions.  Regina Rutherford Katty Fretwell, PA 12/27/2020, 3:56 PM

## 2020-12-27 NOTE — Telephone Encounter (Signed)
Left VM

## 2020-12-27 NOTE — Telephone Encounter (Signed)
Procedure is 01/24/21. Will reach out to her on Wednesday and see about scheduling appt closer to DOS.

## 2020-12-31 ENCOUNTER — Other Ambulatory Visit (INDEPENDENT_AMBULATORY_CARE_PROVIDER_SITE_OTHER): Payer: Self-pay | Admitting: Family Medicine

## 2020-12-31 DIAGNOSIS — E1169 Type 2 diabetes mellitus with other specified complication: Secondary | ICD-10-CM

## 2021-01-01 NOTE — Telephone Encounter (Signed)
Pt last seen by Dr. Wallace.  

## 2021-01-02 NOTE — Telephone Encounter (Signed)
Attempted to contact pt to see if she would like to come see me on 9/28. Left message asking her to call me directly if she gets this message today; provided her w/ my # and main office #.

## 2021-01-08 NOTE — Telephone Encounter (Signed)
Scheduled

## 2021-01-15 MED ORDER — ENOXAPARIN SODIUM 120 MG/0.8ML IJ SOSY: 120.0000 mg | PREFILLED_SYRINGE | Freq: Two times a day (BID) | INTRAMUSCULAR | 0 refills | Status: DC

## 2021-01-15 NOTE — Addendum Note (Signed)
Addended by: Malena Peer D on: 01/15/2021 03:01 PM   Modules accepted: Orders

## 2021-01-15 NOTE — Telephone Encounter (Signed)
10/2: Last dose of Eliquis in the PM  10/3: No Eliquis: Inject Lovenox 120mg  into the abdomen at 10 AM and 10 PM. Rotate injection sites.  10/4: No Eliquis: Inject Lovenox 120mg  into the abdomen at 10 AM and 10 PM. Rotate injection sites.  10/5: No Eliquis: Inject Lovenox 120mg  into the abdomen at 10 AM. No PM dose.   10/6 procedure day: no Eliquis, no lovenox  Resume Eliquis as soon as deemed safe by MD. Resume at 5mg  twice a day.

## 2021-01-15 NOTE — Telephone Encounter (Addendum)
Please fax bridging instructions for review to [place] at [facility]   [phone]

## 2021-01-16 ENCOUNTER — Other Ambulatory Visit: Payer: Self-pay

## 2021-01-16 ENCOUNTER — Ambulatory Visit (INDEPENDENT_AMBULATORY_CARE_PROVIDER_SITE_OTHER): Payer: BC Managed Care – PPO

## 2021-01-16 DIAGNOSIS — Z7901 Long term (current) use of anticoagulants: Secondary | ICD-10-CM | POA: Diagnosis not present

## 2021-01-16 DIAGNOSIS — I4891 Unspecified atrial fibrillation: Secondary | ICD-10-CM

## 2021-01-16 NOTE — Patient Instructions (Signed)
10/2: Last dose of Eliquis in the PM  10/3: No Eliquis: Inject Lovenox 120mg into the abdomen at 10 AM and 10 PM. Rotate injection sites.  10/4: No Eliquis: Inject Lovenox 120mg into the abdomen at 10 AM and 10 PM. Rotate injection sites.  10/5: No Eliquis: Inject Lovenox 120mg into the abdomen at 10 AM. No PM dose.   10/6 procedure day: no Eliquis, no lovenox  Resume Eliquis as soon as deemed safe by MD. Resume at 5mg twice a day. 

## 2021-01-17 ENCOUNTER — Encounter (INDEPENDENT_AMBULATORY_CARE_PROVIDER_SITE_OTHER): Payer: Self-pay | Admitting: Adult Health

## 2021-01-17 ENCOUNTER — Other Ambulatory Visit: Payer: Self-pay

## 2021-01-17 ENCOUNTER — Ambulatory Visit (INDEPENDENT_AMBULATORY_CARE_PROVIDER_SITE_OTHER): Payer: BC Managed Care – PPO | Admitting: Adult Health

## 2021-01-17 VITALS — BP 120/80 | HR 76 | Temp 97.6°F | Ht 66.0 in | Wt 250.0 lb

## 2021-01-17 DIAGNOSIS — Z6841 Body Mass Index (BMI) 40.0 and over, adult: Secondary | ICD-10-CM

## 2021-01-17 DIAGNOSIS — M25562 Pain in left knee: Secondary | ICD-10-CM | POA: Insufficient documentation

## 2021-01-17 DIAGNOSIS — E559 Vitamin D deficiency, unspecified: Secondary | ICD-10-CM

## 2021-01-17 DIAGNOSIS — Z9189 Other specified personal risk factors, not elsewhere classified: Secondary | ICD-10-CM

## 2021-01-17 DIAGNOSIS — E1169 Type 2 diabetes mellitus with other specified complication: Secondary | ICD-10-CM | POA: Diagnosis not present

## 2021-01-17 MED ORDER — VITAMIN D (ERGOCALCIFEROL) 1.25 MG (50000 UNIT) PO CAPS: 50000.0000 [IU] | ORAL_CAPSULE | ORAL | 0 refills | Status: DC

## 2021-01-17 NOTE — Progress Notes (Signed)
Chief Complaint:   OBESITY Regina Christensen is here to discuss her progress with her obesity treatment plan along with follow-up of her obesity related diagnoses. Regina Christensen is on the Category 1 Plan and states she is following her eating plan approximately 80% of the time. Regina Christensen states she is not exercising regularly at this time.  Today's visit was #: 8 Starting weight: 255 lbs Starting date: 05/10/2020 Today's weight: 250 lbs Today's date: 01/17/2021 Total lbs lost to date: 5 lbs Total lbs lost since last in-office visit: 1 lb  Interim History: When discussing daily intake, Regina Christensen reports getting little to no protein intake each day. She has been trying to select healthier foods at meals and snacks- lower cal/CHO content. Will undergo left TKR in 1 week.  Subjective:   1. Vitamin D deficiency Vitamin D level on 11/14/2020 - 37.3 - well below goal of 50. She is currently taking prescription ergocalciferol 50,000 IU each week. She denies nausea, vomiting or muscle weakness.  Lab Results  Component Value Date   VD25OH 37.3 11/14/2020   2. Left knee pain, unspecified chronicity Osteoarthritis of L knee, scheduled for total replacement next week.  3. Type 2 diabetes mellitus with other specified complication, without long-term current use of insulin (HCC) She is on Ozempic 1 mg subcutaneously twice weekly.   Lab Results  Component Value Date   HGBA1C 6.0 (H) 08/16/2020   HGBA1C 6.2 (H) 03/01/2020   HGBA1C 6.3 (H) 11/29/2019   Lab Results  Component Value Date   LDLCALC 49 03/01/2020   CREATININE 0.68 11/14/2020   4. At risk for deficient intake of food The patient is at a higher than average risk of deficient intake of food due to not getting in enough protein each day.   Assessment/Plan:   1. Vitamin D deficiency Low Vitamin D level contributes to fatigue and are associated with obesity, breast, and colon cancer. She agrees to continue to take prescription  Vitamin D @50 ,000 IU every week and will follow-up for routine testing of Vitamin D, at least 2-3 times per year to avoid over-replacement.  - Refill Vitamin D, Ergocalciferol, (DRISDOL) 1.25 MG (50000 UNIT) CAPS capsule; Take 1 capsule (50,000 Units total) by mouth every 7 (seven) days.  Dispense: 4 capsule; Refill: 0  2. Left knee pain, unspecified chronicity Increase protein.   Follow the orthopedic surgeon's postop instructions.  3. Type 2 diabetes mellitus with other specified complication, without long-term current use of insulin (HCC) Continue Ozempic as directed. Good blood sugar control is important to decrease the likelihood of diabetic complications such as nephropathy, neuropathy, limb loss, blindness, coronary artery disease, and death. Intensive lifestyle modification including diet, exercise and weight loss are the first line of treatment for diabetes.   4. At risk for deficient intake of food Regina Christensen was given approximately 15 minutes of deficit intake of food prevention counseling today. Regina Christensen is at risk for eating too few calories based on current food recall. She was encouraged to focus on meeting caloric and protein goals according to her recommended meal plan.    5. Obesity, current BMI 53.3  Regina Christensen is currently in the action stage of change. As such, her goal is to continue with weight loss efforts. She has agreed to the Category 1 Plan.   Meal prep/plan prior to surgery for at least 2 weeks postop.    Increase protein intake - strive for 20-35+ grams of protein per meal.  Exercise goals: No exercise has been prescribed  at this time.  Behavioral modification strategies: increasing lean protein intake, decreasing simple carbohydrates, meal planning and cooking strategies, keeping healthy foods in the home, and planning for success.  Regina Christensen has agreed to follow-up with our clinic in 5 weeks. She was informed of the importance of frequent follow-up visits to  maximize her success with intensive lifestyle modifications for her multiple health conditions.   Objective:   Blood pressure 120/80, pulse 76, temperature 97.6 F (36.4 C), height 5\' 6"  (1.676 m), weight 250 lb (113.4 kg), SpO2 98 %. Body mass index is 40.35 kg/m.  General: Cooperative, alert, well developed, in no acute distress. HEENT: Conjunctivae and lids unremarkable. Cardiovascular: Regular rhythm.  Lungs: Normal work of breathing. Neurologic: No focal deficits.   Lab Results  Component Value Date   CREATININE 0.68 11/14/2020   BUN 14 11/14/2020   NA 142 11/14/2020   K 3.7 11/14/2020   CL 101 11/14/2020   CO2 24 11/14/2020   Lab Results  Component Value Date   ALT 9 11/14/2020   AST 9 11/14/2020   ALKPHOS 111 11/14/2020   BILITOT <0.2 11/14/2020   Lab Results  Component Value Date   HGBA1C 6.0 (H) 08/16/2020   HGBA1C 6.2 (H) 03/01/2020   HGBA1C 6.3 (H) 11/29/2019   HGBA1C 6.3 (H) 02/03/2019   HGBA1C 5.8 (H) 02/05/2018   Lab Results  Component Value Date   TSH 1.250 11/14/2020   Lab Results  Component Value Date   CHOL 132 03/01/2020   HDL 69 03/01/2020   LDLCALC 49 03/01/2020   TRIG 69 03/01/2020   CHOLHDL 1.9 03/01/2020   Lab Results  Component Value Date   VD25OH 37.3 11/14/2020   Lab Results  Component Value Date   WBC 9.7 11/14/2020   HGB 12.9 11/14/2020   HCT 40.9 11/14/2020   MCV 90 11/14/2020   PLT 342 11/14/2020   Lab Results  Component Value Date   IRON 39 11/14/2020   TIBC 329 11/14/2020   FERRITIN 25 11/14/2020   Attestation Statements:   Reviewed by clinician on day of visit: allergies, medications, problem list, medical history, surgical history, family history, social history, and previous encounter notes.  I, 11/16/2020, CMA, am acting as Insurance claims handler for Energy manager, NP.  I have reviewed the above documentation for accuracy and completeness, and I agree with the above. -  Kaiyu Mirabal d. Haely Leyland, NP-C

## 2021-01-31 HISTORY — PX: TOTAL KNEE ARTHROPLASTY: SHX125

## 2021-02-05 ENCOUNTER — Other Ambulatory Visit: Payer: Self-pay

## 2021-02-05 ENCOUNTER — Ambulatory Visit
Admission: RE | Admit: 2021-02-05 | Discharge: 2021-02-05 | Disposition: A | Discharge status: Home / Self Care | Payer: BC Managed Care – PPO | Source: Ambulatory Visit | Attending: Orthopedic Surgery | Admitting: Orthopedic Surgery

## 2021-02-05 ENCOUNTER — Other Ambulatory Visit: Payer: Self-pay | Admitting: Orthopedic Surgery

## 2021-02-05 DIAGNOSIS — R2242 Localized swelling, mass and lump, left lower limb: Secondary | ICD-10-CM

## 2021-02-05 DIAGNOSIS — Z96652 Presence of left artificial knee joint: Secondary | ICD-10-CM

## 2021-02-09 ENCOUNTER — Other Ambulatory Visit: Payer: Self-pay | Admitting: Medical

## 2021-02-11 ENCOUNTER — Other Ambulatory Visit: Payer: Self-pay

## 2021-02-11 MED ORDER — APIXABAN 5 MG PO TABS: 5.0000 mg | ORAL_TABLET | Freq: Two times a day (BID) | ORAL | 1 refills | Status: DC

## 2021-02-11 NOTE — Telephone Encounter (Signed)
Prescription refill request for Eliquis received. Indication: Afib  Last office visit:11/01/20 ([facility])  Scr: 0.68 (11/14/20) Age: 65 Weight: 113.4kg  Appropriate dose and refill sent to requested pharmacy.

## 2021-02-13 ENCOUNTER — Encounter: Payer: Self-pay | Admitting: Psychiatry

## 2021-02-13 ENCOUNTER — Telehealth (INDEPENDENT_AMBULATORY_CARE_PROVIDER_SITE_OTHER): Payer: BC Managed Care – PPO | Admitting: Psychiatry

## 2021-02-13 ENCOUNTER — Other Ambulatory Visit: Payer: Self-pay

## 2021-02-13 DIAGNOSIS — F3342 Major depressive disorder, recurrent, in full remission: Secondary | ICD-10-CM

## 2021-02-13 DIAGNOSIS — F411 Generalized anxiety disorder: Secondary | ICD-10-CM

## 2021-02-13 DIAGNOSIS — F5105 Insomnia due to other mental disorder: Secondary | ICD-10-CM | POA: Diagnosis not present

## 2021-02-13 MED ORDER — VENLAFAXINE HCL ER 75 MG PO CP24: 75.0000 mg | ORAL_CAPSULE | Freq: Every day | ORAL | 1 refills | Status: DC

## 2021-02-13 MED ORDER — VENLAFAXINE HCL ER 37.5 MG PO CP24: 37.5000 mg | ORAL_CAPSULE | Freq: Every day | ORAL | 1 refills | Status: DC

## 2021-02-13 NOTE — Progress Notes (Signed)
Virtual Visit via Video Note  I connected with Regina Christensen on 02/13/21 at  8:30 AM EDT by a video enabled telemedicine application and verified that I am speaking with the correct person using two identifiers.  Location Provider Location : [facility] Patient Location : Home  Participants: Patient , Provider    I discussed the limitations of evaluation and management by telemedicine and the availability of in person appointments. The patient expressed understanding and agreed to proceed.   I discussed the assessment and treatment plan with the patient. The patient was provided an opportunity to ask questions and all were answered. The patient agreed with the plan and demonstrated an understanding of the instructions.   The patient was advised to call back or seek an in-person evaluation if the symptoms worsen or if the condition fails to improve as anticipated.   [facility] MD OP Progress Note  02/13/2021 8:55 AM Regina Christensen  MRN:  [id number]  Chief Complaint:  Chief Complaint   Follow-up; Depression; Anxiety    HPI: Regina Christensen is a 65 year old female, retired, lives in [place], has a history of GAD, ADD, hypertension, gastric bypass, hyperlipidemia, OSA, arthritis, total arthroplasty of knee, was evaluated by telemedicine today.  Patient today reports she is currently recovering from left knee replacement, this was almost 2 weeks ago.  She continues to be in pain and is currently on oxycodone which helps.  Patient reports overall mood symptoms are stable.  The pain does affect her sleep at times however overall she has been sleeping okay.  She reports appetite as good.  She denies any suicidality, homicidality or perceptual disturbances.  She is compliant on venlafaxine, denies side effects.  Patient denies any other concerns today.  Visit Diagnosis:    ICD-10-CM   1. GAD (generalized anxiety disorder)  F41.1 venlafaxine XR (EFFEXOR-XR) 37.5 MG 24 hr capsule     venlafaxine XR (EFFEXOR XR) 75 MG 24 hr capsule    2. MDD (major depressive disorder), recurrent, in full remission (HCC)  F33.42 venlafaxine XR (EFFEXOR XR) 75 MG 24 hr capsule    3. Insomnia due to mental condition  F51.05    anxiety      Past Psychiatric History: Reviewed past psychiatric history from progress note on 01/27/2019.  Past trials of Prozac, Effexor.  Past Medical History:  Past Medical History:  Diagnosis Date   Anxiety    Arthritis    Back pain    BPPV (benign paroxysmal positional vertigo)    Chronic diastolic HF (heart failure) (HCC)    Complication of anesthesia    Post-operative hypoxia requiring supplemental oxygen   Current use of long term anticoagulation    Apixaban   CVA (cerebral vascular accident) (HCC) 02/28/2020   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; imaging from [facility]   Depression    Edema of both lower extremities    GERD (gastroesophageal reflux disease)    Hypertension    IBS (irritable bowel syndrome)    Lactose intolerance    Morbid obesity with BMI of 45.0-49.9, adult (HCC)    Obesity    Osteoarthritis    PAF (paroxysmal atrial fibrillation) (HCC)    Pre-diabetes    Sleep apnea    uses CPAP machine sometimes    Type 2 diabetes mellitus without complication (HCC)    Vitamin D deficiency     Past Surgical History:  Procedure Laterality Date   ABDOMINAL HYSTERECTOMY     BREAST EXCISIONAL BIOPSY Left  ENDOMETRIAL ABLATION     x2   ENDOSCOPIC CONCHA BULLOSA RESECTION Bilateral 08/08/2020   Procedure: ENDOSCOPIC CONCHA BULLOSA RESECTION;  Surgeon: Vernie Murders, MD;  Location: [facility];  Service: ENT;  Laterality: Bilateral;   ETHMOIDECTOMY Right 08/08/2020   Procedure: ETHMOIDECTOMY, LEFT PARTIAL ETHMOIDECTOMY;  Surgeon: Vernie Murders, MD;  Location: [facility];  Service: ENT;  Laterality: Right;   IMAGE GUIDED SINUS SURGERY N/A 08/08/2020   Procedure: IMAGE GUIDED SINUS SURGERY, RIGHT FRONTAL SINUSOTOMY;  Surgeon: Vernie Murders, MD;  Location: [facility];  Service: ENT;  Laterality: N/A;   LAPAROSCOPIC SLEEVE GASTRECTOMY     MAXILLARY ANTROSTOMY Bilateral 08/08/2020   Procedure: MAXILLARY ANTROSTOMY with tissue;  Surgeon: Vernie Murders, MD;  Location: [facility];  Service: ENT;  Laterality: Bilateral;   NASAL TURBINATE REDUCTION  08/27/2020   Procedure: TURBINATE REDUCTION /SUBMUCOSAL DEBRIDEMENT;  Surgeon: Vernie Murders, MD;  Location: [facility];  Service: ENT;;   TOTAL KNEE ARTHROPLASTY Right     Family Psychiatric History: Reviewed family psychiatric history from progress note on 01/27/2019  Family History:  Family History  Problem Relation Age of Onset   Diabetes Mother    High blood pressure Mother    Heart disease Father    Cancer Brother    Mental illness Neg Hx     Social History: Reviewed social history from progress note on 01/27/2019 Social History   Socioeconomic History   Marital status: Divorced    Spouse name: Not on file   Number of children: 2   Years of education: Not on file   Highest education level: Associate degree: occupational, Scientist, product/process development, or vocational program  Occupational History   Occupation: Retired/ work PT    Employer: [employer]  Tobacco Use   Smoking status: Never   Smokeless tobacco: Never  Building services engineer Use: Never used  Substance and Sexual Activity   Alcohol use: No    Alcohol/week: 0.0 standard drinks    Comment: rare   Drug use: No   Sexual activity: Not on file  Other Topics Concern   Not on file  Social History Narrative   Daughter lives with her    Stressed at work, needs to meet quotas on her first 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 )    Social Determinants of Health   Financial Resource Strain: Not on file  Food Insecurity: Not on file  Transportation Needs: Not on file  Physical Activity: Not on file  Stress: Not on file  Social Connections: Not on file    Allergies:  Allergies  Allergen Reactions    Hydrocodone-Acetaminophen Other (See Comments)    Extreme headache   Ace Inhibitors Cough   Hctz [Hydrochlorothiazide] Rash    face    Metabolic Disorder Labs: Lab Results  Component Value Date   HGBA1C 6.0 (H) 08/16/2020   MPG 125.5 08/16/2020   MPG 131.24 03/01/2020   No results found for: PROLACTIN Lab Results  Component Value Date   CHOL 132 03/01/2020   TRIG 69 03/01/2020   HDL 69 03/01/2020   CHOLHDL 1.9 03/01/2020   VLDL 14 03/01/2020   LDLCALC 49 03/01/2020   LDLCALC 50 11/29/2019   Lab Results  Component Value Date   TSH 1.250 11/14/2020   TSH 1.358 03/01/2020    Therapeutic Level Labs: No results found for: LITHIUM No results found for: VALPROATE No components found for:  CBMZ  Current Medications: Current Outpatient Medications  Medication Sig  Dispense Refill   acetaminophen (TYLENOL) 650 MG CR tablet Take 1 tablet (650 mg total) by mouth every 8 (eight) hours as needed for pain.     amLODipine (NORVASC) 10 MG tablet TAKE 1 TABLET BY MOUTH EVERY DAY 90 tablet 1   amoxicillin (AMOXIL) 500 MG tablet amoxicillin 500 mg tablet  TAKE 4 TABLETS BY ORAL ROUTE 1 HOUR BEFORE DENTIST OR OTHER PROCEDURES     apixaban (ELIQUIS) 5 MG TABS tablet Take 1 tablet (5 mg total) by mouth 2 (two) times daily. 180 tablet 1   famotidine (PEPCID) 20 MG tablet Take 1 tablet (20 mg total) by mouth daily. 90 tablet 1   meclizine (ANTIVERT) 12.5 MG tablet Take 1 tablet (12.5 mg total) by mouth 3 (three) times daily as needed for dizziness. 30 tablet 0   methylPREDNISolone (MEDROL DOSEPAK) 4 MG TBPK tablet See admin instructions. see package     metoprolol tartrate (LOPRESSOR) 100 MG tablet Take 1 tablet (100 mg total) by mouth 2 (two) times daily. 180 tablet 0   olmesartan (BENICAR) 40 MG tablet TAKE 1 TABLET BY MOUTH EVERY DAY 90 tablet 0   oxyCODONE (OXY IR/ROXICODONE) 5 MG immediate release tablet oxycodone 5 mg tablet  1 PO Q4H PRN PAIN     Semaglutide, 1 MG/DOSE, (OZEMPIC, 1  MG/DOSE,) 4 MG/3ML SOPN Inject 1 mg into the skin 2 (two) times a week. 6 mL 0   tiZANidine (ZANAFLEX) 4 MG tablet tizanidine 4 mg tablet     triamcinolone (NASACORT) 55 MCG/ACT AERO nasal inhaler Place 2 sprays into the nose daily.     Vitamin D, Ergocalciferol, (DRISDOL) 1.25 MG (50000 UNIT) CAPS capsule Take 1 capsule (50,000 Units total) by mouth every 7 (seven) days. 4 capsule 0   XIIDRA 5 % SOLN Place 1 drop into both eyes 2 (two) times daily.  4   enoxaparin (LOVENOX) 120 MG/0.8ML injection Inject 0.8 mLs (120 mg total) into the skin every 12 (twelve) hours. (Patient not taking: Reported on 02/13/2021) 4 mL 0   Oxycodone HCl 10 MG TABS oxycodone 10 mg tablet  Take 1 tablet every 4 hours by oral route as needed for 5 days. (Patient not taking: Reported on 02/13/2021)     venlafaxine XR (EFFEXOR XR) 75 MG 24 hr capsule Take 1 capsule (75 mg total) by mouth daily with breakfast. Take along with 37.5 mg daily 90 capsule 1   venlafaxine XR (EFFEXOR-XR) 37.5 MG 24 hr capsule Take 1 capsule (37.5 mg total) by mouth daily with breakfast. Take along with 75 mg daily 90 capsule 1   No current facility-administered medications for this visit.     Musculoskeletal: Strength & Muscle Tone:  UTA Gait & Station:  Seated Patient leans: N/A  Psychiatric Specialty Exam: Review of Systems  Musculoskeletal:        S/p left knee surgery, surgical pain controlled on medication  Psychiatric/Behavioral:  Negative for agitation, behavioral problems, confusion, decreased concentration, dysphoric mood, hallucinations, self-injury, sleep disturbance and suicidal ideas. The patient is not nervous/anxious and is not hyperactive.   All other systems reviewed and are negative.  There were no vitals taken for this visit.There is no height or weight on file to calculate BMI.  General Appearance: Casual  Eye Contact:  Fair  Speech:  Clear and Coherent  Volume:  Normal  Mood:  Euthymic  Affect:  Congruent   Thought Process:  Goal Directed and Descriptions of Associations: Intact  Orientation:  Full (Time, Place, and  Person)  Thought Content: Logical   Suicidal Thoughts:  No  Homicidal Thoughts:  No  Memory:  Immediate;   Fair Recent;   Fair Remote;   Fair  Judgement:  Fair  Insight:  Fair  Psychomotor Activity:  Normal  Concentration:  Concentration: Fair and Attention Span: Fair  Recall:  [employer] of Knowledge: Fair  Language: Fair  Akathisia:  No  Handed:  Right  AIMS (if indicated): done- 0  Assets:  Communication Skills Desire for Improvement Housing Social Support Transportation Vocational/Educational  ADL's:  Intact  Cognition: WNL  Sleep:  Fair   Screenings: GAD-7    Flowsheet Row Video Visit from 02/13/2021 in [facility] Video Visit from 11/12/2020 in [facility] Video Visit from 09/04/2020 in [facility] Office Visit from 01/27/2019 in [facility] Office Visit from 01/21/2019 in [facility]  Total GAD-7 Score 0 3 2 10 6       PHQ2-9    Flowsheet Row Video Visit from 02/13/2021 in [facility] Office Visit from 12/10/2020 in [facility] Video Visit from 11/12/2020 in [facility] Office Visit from 09/10/2020 in [facility] Video Visit from 09/04/2020 in [facility]  PHQ-2 Total Score 0 0 0 0 0  PHQ-9 Total Score -- -- 1 -- 1      Flowsheet Row Video Visit from 11/12/2020 in [facility] Video Visit from 09/04/2020 in [facility] Pre-Admission Testing 46 from 08/24/2020 in [facility]  C-SSRS RISK CATEGORY No Risk No Risk No Risk        Assessment and Plan: Regina Christensen is a 65 year old African-American  female, divorced, retired, lives in [place], has a history of anxiety, depression, hypertension, hyperlipidemia, arthritis, paroxysmal atrial fibrillation currently in sinus rhythm was evaluated by telemedicine today.  Patient is status post left knee arthroplasty, currently in pain however mood symptoms are currently stable.  Plan as noted below.  Plan GAD-stable Venlafaxine extended release 112.5 mg p.o. daily  MDD in remission Venlafaxine extended release 112.5 mg p.o. daily  Insomnia-stable Melatonin 5 mg p.o. nightly Continue sleep hygiene techniques  Follow-up in clinic in 4 months or sooner in person.  This note was generated in part or whole with voice recognition software. Voice recognition is usually quite accurate but there are transcription errors that can and very often do occur. I apologize for any typographical errors that were not detected and corrected.      [place], MD 02/14/2021, 9:35 AM

## 2021-02-26 ENCOUNTER — Encounter (INDEPENDENT_AMBULATORY_CARE_PROVIDER_SITE_OTHER): Payer: Self-pay

## 2021-02-28 ENCOUNTER — Other Ambulatory Visit: Payer: Self-pay | Admitting: Family Medicine

## 2021-02-28 ENCOUNTER — Ambulatory Visit (INDEPENDENT_AMBULATORY_CARE_PROVIDER_SITE_OTHER): Payer: BC Managed Care – PPO | Admitting: Family Medicine

## 2021-02-28 DIAGNOSIS — I10 Essential (primary) hypertension: Secondary | ICD-10-CM

## 2021-02-28 NOTE — Telephone Encounter (Signed)
Requested Prescriptions  Pending Prescriptions Disp Refills  . olmesartan (BENICAR) 40 MG tablet [Pharmacy Med Name: OLMESARTAN MEDOXOMIL 40 MG TAB] 90 tablet 0    Sig: TAKE 1 TABLET BY MOUTH EVERY DAY     Cardiovascular:  Angiotensin Receptor Blockers Passed - 02/28/2021  4:01 AM      Passed - Cr in normal range and within 180 days    Creat  Date Value Ref Range Status  11/29/2019 0.76 0.50 - 0.99 mg/dL Final    Comment:    For patients >64 years of age, the reference limit for Creatinine is approximately 13% higher for people identified as African-American. .    Creatinine, Ser  Date Value Ref Range Status  11/14/2020 0.68 0.57 - 1.00 mg/dL Final         Passed - K in normal range and within 180 days    Potassium  Date Value Ref Range Status  11/14/2020 3.7 3.5 - 5.2 mmol/L Final  07/10/2012 3.5 3.5 - 5.1 mmol/L Final         Passed - Patient is not pregnant      Passed - Last BP in normal range    BP Readings from Last 1 Encounters:  01/17/21 120/80         Passed - Valid encounter within last 6 months    Recent Outpatient Visits          2 months ago Discoloration of skin   [facility] Dense M, DO   5 months ago OSA (obstructive sleep apnea)   [facility] [name], MD   8 months ago Morbid obesity [facility])   [facility] [name], MD   11 months ago Viral upper respiratory tract infection   [facility] Welford Roche D, MD   12 months ago History of CVA (cerebrovascular accident) without residual deficits   [facility] [name], MD      Future Appointments            In 1 week [name], MD [facility]

## 2021-03-12 NOTE — Progress Notes (Signed)
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  2.  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  3.  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    MR Angio neck and head with and without contrast was normal   Echo was negative for thrombus, mild diastolic dysfunction   Vertigo: she was diagnosed with BPPV previously had Epley maneuver, but symptoms have been more intense and was seen by ENT and neurologist , she had vestibular rehab and no episodes lately, she still has meclizine at home   Afib:   she was on  Eliquis but since CVA  02/28/2020, she was very compliant with medication, however had nose bleed that required blood transfusion after sinus surgery in April, she is under the care of hematologist and is back on medication Now compliant with metoprolol , very seldom has palpitations.    MDD/GAD: she  is under the care of Dr. Shea Evans . 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 She is compliant with medication and is taking Effexor as prescribed. Currently not working due to recent replacement surgery    Insomnia: she states Dr. Shea Evans , she was given rx of Rozerem but states it caused nightmares, she states her sleep is currently off due to surgery and having to get up to take pain medications    HTN: she is only on lopressor, amlodipine and Benicar, off clonidine. No chest pain or palpitation    OSA: she has not been wearing her CPAP  Morbid obesity: she  [facility], and was losing weight but since  knee surgery she has gained 6 lbs back. She has a history of sleeve  bariatric surgery years ago, her weight prior to surgery was 300 lbs and  she lost 50 lbs , weight is stable.    History of bilateral knee replacement: right in 2021 and left Oct 2022, still using a cane and having PT, pain is controlled  with medications    Trigeminal neuralgia. She used to take tegretol in the past and needs a refill, pain is shooting and had been gone for years but had some pain end of December 2020 , no flares in months. No problems at this time    Pre-diabetes: she is on Ozempic, last A1C was 6.2. %, today 5.6 %  she denies polyphagia, polyuria or polydipsia .   History of sinus surgery: she had complications / bleeding , developed anemia, but last level was back to normal since she received 3 units of RBC Unchanged   Patient Active Problem List   Diagnosis Date Noted   History of blood transfusion 09/10/2020   Current use of long term anticoagulation    Gastroesophageal reflux disease    Prediabetes 06/12/2020   Vitamin D deficiency 04/12/2020   History of CVA (cerebrovascular accident) without residual deficits 03/05/2020   Headache disorder 12/20/2019   History of hysterectomy 07/19/2019   Anxiety 07/19/2019   MDD (major depressive disorder), recurrent, in  full remission ([place]) 07/19/2019   Insomnia due to mental condition 07/19/2019   History of total knee arthroplasty 06/06/2019   A-fib ([place]) 02/08/2019   Bilateral carpal tunnel syndrome 07/30/2016   OSA (obstructive sleep apnea) 03/07/2016   MDD (major depressive disorder), recurrent episode, mild ([place]) 11/30/2015   Osteoarthritis of both knees 11/30/2015   BPPV (benign paroxysmal positional vertigo) 08/28/2015   Essential hypertension 11/09/2014   Hyperglycemia 11/09/2014   GAD (generalized anxiety disorder) 11/09/2014   Acanthosis nigricans 11/09/2014   Class 3 severe obesity with serious comorbidity and body mass index (BMI) of 40.0 to  44.9 in adult [facility]) 01/06/2012    Past Surgical History:  Procedure Laterality Date   ABDOMINAL HYSTERECTOMY     BREAST EXCISIONAL BIOPSY Left    ENDOMETRIAL ABLATION     x2   ENDOSCOPIC CONCHA BULLOSA RESECTION Bilateral 08/08/2020   Procedure: ENDOSCOPIC CONCHA BULLOSA RESECTION;  Surgeon: Margaretha Sheffield, MD;  Location: [facility] ORS;  Service: ENT;  Laterality: Bilateral;   ETHMOIDECTOMY Right 08/08/2020   Procedure: ETHMOIDECTOMY, LEFT PARTIAL ETHMOIDECTOMY;  Surgeon: Margaretha Sheffield, MD;  Location: [facility] ORS;  Service: ENT;  Laterality: Right;   IMAGE GUIDED SINUS SURGERY N/A 08/08/2020   Procedure: IMAGE GUIDED SINUS SURGERY, RIGHT FRONTAL SINUSOTOMY;  Surgeon: Margaretha Sheffield, MD;  Location: [facility] ORS;  Service: ENT;  Laterality: N/A;   LAPAROSCOPIC SLEEVE GASTRECTOMY     MAXILLARY ANTROSTOMY Bilateral 08/08/2020   Procedure: MAXILLARY ANTROSTOMY with tissue;  Surgeon: Margaretha Sheffield, MD;  Location: [facility] ORS;  Service: ENT;  Laterality: Bilateral;   NASAL TURBINATE REDUCTION  08/27/2020   Procedure: TURBINATE REDUCTION /SUBMUCOSAL DEBRIDEMENT;  Surgeon: Margaretha Sheffield, MD;  Location: [facility] ORS;  Service: ENT;;   TOTAL KNEE ARTHROPLASTY Right 06/2019   TOTAL KNEE ARTHROPLASTY Left 01/31/2021   [facility]    Family History  Problem Relation Age of Onset   Diabetes Mother    High blood pressure Mother    Heart disease Father    Cancer Brother    Mental illness Neg Hx     Social History   Tobacco Use   Smoking status: Never   Smokeless tobacco: Never  Substance Use Topics   Alcohol use: No    Alcohol/week: 0.0 standard drinks    Comment: rare     Current Outpatient Medications:    acetaminophen (TYLENOL) 650 MG CR tablet, Take 1 tablet (650 mg total) by mouth every 8 (eight) hours as needed for pain., Disp: , Rfl:    amLODipine (NORVASC) 10 MG tablet, TAKE 1 TABLET BY MOUTH EVERY DAY, Disp: 90 tablet, Rfl: 1   amoxicillin (AMOXIL) 500 MG tablet, amoxicillin  500 mg tablet  TAKE 4 TABLETS BY ORAL ROUTE 1 HOUR BEFORE DENTIST OR OTHER PROCEDURES, Disp: , Rfl:    apixaban (ELIQUIS) 5 MG TABS tablet, Take 1 tablet (5 mg total) by mouth 2 (two) times daily., Disp: 180 tablet, Rfl: 1   famotidine (PEPCID) 20 MG tablet, Take 1 tablet (20 mg total) by mouth daily., Disp: 90 tablet, Rfl: 1   meclizine (ANTIVERT) 12.5 MG tablet, Take 1 tablet (12.5 mg total) by mouth 3 (three) times daily as needed for dizziness., Disp: 30 tablet, Rfl: 0   metoprolol tartrate (LOPRESSOR) 100 MG tablet, Take 1 tablet (100 mg total) by mouth 2 (two) times daily., Disp: 180 tablet, Rfl: 0   olmesartan (BENICAR) 40 MG tablet, TAKE 1 TABLET BY MOUTH EVERY DAY, Disp: 90 tablet, Rfl: 0   oxyCODONE (OXY  IR/ROXICODONE) 5 MG immediate release tablet, oxycodone 5 mg tablet  1 PO Q4H PRN PAIN, Disp: , Rfl:    Semaglutide, 1 MG/DOSE, (OZEMPIC, 1 MG/DOSE,) 4 MG/3ML SOPN, Inject 1 mg into the skin 2 (two) times a week., Disp: 6 mL, Rfl: 0   tiZANidine (ZANAFLEX) 4 MG tablet, tizanidine 4 mg tablet, Disp: , Rfl:    triamcinolone (NASACORT) 55 MCG/ACT AERO nasal inhaler, Place 2 sprays into the nose daily., Disp: , Rfl:    venlafaxine XR (EFFEXOR XR) 75 MG 24 hr capsule, Take 1 capsule (75 mg total) by mouth daily with breakfast. Take along with 37.5 mg daily, Disp: 90 capsule, Rfl: 1   venlafaxine XR (EFFEXOR-XR) 37.5 MG 24 hr capsule, Take 1 capsule (37.5 mg total) by mouth daily with breakfast. Take along with 75 mg daily, Disp: 90 capsule, Rfl: 1   Vitamin D, Ergocalciferol, (DRISDOL) 1.25 MG (50000 UNIT) CAPS capsule, Take 1 capsule (50,000 Units total) by mouth every 7 (seven) days., Disp: 4 capsule, Rfl: 0   XIIDRA 5 % SOLN, Place 1 drop into both eyes 2 (two) times daily., Disp: , Rfl: 4  Allergies  Allergen Reactions   Hydrocodone-Acetaminophen Other (See Comments)    Extreme headache   Ace Inhibitors Cough   Hctz [Hydrochlorothiazide] Rash    face    I personally reviewed active  problem list, medication list, allergies, family history, social history, health maintenance with the patient/caregiver today.   ROS  Constitutional: Negative for fever , positive for  weight change.  Respiratory: Negative for cough , but has some shortness of breath with activity    Cardiovascular: Negative for chest pain or palpitations.  Gastrointestinal: Negative for abdominal pain, no bowel changes.  Musculoskeletal: Positive or gait problem and joint swelling.  Skin: Negative for rash.  Neurological: Negative for dizziness or headache.  No other specific complaints in a complete review of systems (except as listed in HPI above).   Objective  Vitals:   03/13/21 0732  BP: 132/80  Pulse: 85  Resp: 16  Temp: 98.1 F (36.7 C)  SpO2: 99%  Weight: 256 lb (116.1 kg)  Height: 5\' 6"  (1.676 m)    Body mass index is 41.32 kg/m.  Physical Exam  Constitutional: Patient appears well-developed and well-nourished. Obese  No distress.  HEENT: head atraumatic, normocephalic, pupils equal and reactive to light, neck supple Cardiovascular: Normal rate, regular rhythm and normal heart sounds.  No murmur heard. No BLE edema. Pulmonary/Chest: Effort normal and breath sounds normal. No respiratory distress. Abdominal: Soft.  There is no tenderness. Muscular skeletal: using a cane, mild effusion of left knee  Psychiatric: Patient has a normal mood and affect. behavior is normal. Judgment and thought content normal.   Recent Results (from the past 2160 hour(s))  POCT HgB A1C     Status: None   Collection Time: 03/13/21  7:44 AM  Result Value Ref Range   Hemoglobin A1C 5.6 4.0 - 5.6 %   HbA1c POC (<> result, manual entry)     HbA1c, POC (prediabetic range)     HbA1c, POC (controlled diabetic range)       PHQ2/9: Depression screen [facility] 2/9 03/13/2021 12/10/2020 09/10/2020 06/06/2020 05/10/2020  Decreased Interest 0 0 0 0 1  Down, Depressed, Hopeless 0 0 0 0 1  PHQ - 2 Score 0 0 0 0 2   Altered sleeping 1 - - 1 2  Tired, decreased energy 0 - - 1 2  Change in appetite  0 - - 0 2  Feeling bad or failure about yourself  0 - - 0 2  Trouble concentrating 0 - - 0 1  Moving slowly or fidgety/restless 0 - - 0 1  Suicidal thoughts 0 - - 0 0  PHQ-9 Score 1 - - 2 12  Difficult doing work/chores - - - - Somewhat difficult  Some encounter information is confidential and restricted. Go to Review Flowsheets activity to see all data.  Some recent data might be hidden    phq 9 is negative   Fall Risk: Fall Risk  03/13/2021 12/10/2020 09/10/2020 06/06/2020 03/22/2020  Falls in the past year? 0 0 0 0 0  Number falls in past yr: 0 0 0 0 0  Injury with Fall? 0 0 0 0 0  Risk for fall due to : No Fall Risks - - - -  Follow up Falls prevention discussed Falls evaluation completed - - -      Functional Status Survey: Is the patient deaf or have difficulty hearing?: No Does the patient have difficulty seeing, even when wearing glasses/contacts?: No Does the patient have difficulty concentrating, remembering, or making decisions?: No Does the patient have difficulty walking or climbing stairs?: Yes Does the patient have difficulty dressing or bathing?: No Does the patient have difficulty doing errands alone such as visiting a doctor's office or shopping?: No    Assessment & Plan  1. Major depression in remission [facility])  Keep follow up with Dr. Shea Evans   2. Morbid obesity ([place])  Discussed with the patient the risk posed by an increased BMI. Discussed importance of portion control, calorie counting and at least 150 minutes of physical activity weekly. Avoid sweet beverages and drink more water. Eat at least 6 servings of fruit and vegetables daily    3. Need for immunization against influenza  - Flu Vaccine QUAD 6+ mos PF IM (Fluarix Quad PF)  4. Persistent atrial fibrillation (HCC)  - metoprolol tartrate (LOPRESSOR) 100 MG tablet; Take 1 tablet (100 mg total) by mouth 2 (two)  times daily.  Dispense: 180 tablet; Refill: 1  5. Prediabetes  - POCT HgB A1C  6. History of CVA (cerebrovascular accident) without residual deficits  - Lipid panel - atorvastatin (LIPITOR) 10 MG tablet; Take 1 tablet (10 mg total) by mouth daily.  Dispense: 90 tablet; Refill: 3  7. OSA (obstructive sleep apnea)  Not wearing CPAP machine  8. Metabolic syndrome  - COMPLETE METABOLIC PANEL WITH GFR  9. History of total bilateral knee replacement   10. Essential hypertension  - CBC with Differential/Platelet - COMPLETE METABOLIC PANEL WITH GFR - olmesartan (BENICAR) 40 MG tablet; Take 1 tablet (40 mg total) by mouth daily.  Dispense: 90 tablet; Refill: 1  11. Vitamin D deficiency  - Vitamin D, Ergocalciferol, (DRISDOL) 1.25 MG (50000 UNIT) CAPS capsule; Take 1 capsule (50,000 Units total) by mouth every 7 (seven) days.  Dispense: 12 capsule; Refill: 1  12. Need for Tdap vaccination  - Tdap vaccine greater than or equal to 7yo IM

## 2021-03-13 ENCOUNTER — Other Ambulatory Visit: Payer: Self-pay

## 2021-03-13 ENCOUNTER — Encounter: Payer: Self-pay | Admitting: Family Medicine

## 2021-03-13 ENCOUNTER — Ambulatory Visit: Payer: BC Managed Care – PPO | Admitting: Family Medicine

## 2021-03-13 VITALS — BP 132/80 | HR 85 | Temp 98.1°F | Resp 16 | Ht 66.0 in | Wt 256.0 lb

## 2021-03-13 DIAGNOSIS — I4819 Other persistent atrial fibrillation: Secondary | ICD-10-CM | POA: Diagnosis not present

## 2021-03-13 DIAGNOSIS — F325 Major depressive disorder, single episode, in full remission: Secondary | ICD-10-CM

## 2021-03-13 DIAGNOSIS — Z23 Encounter for immunization: Secondary | ICD-10-CM

## 2021-03-13 DIAGNOSIS — E8881 Metabolic syndrome: Secondary | ICD-10-CM

## 2021-03-13 DIAGNOSIS — R7303 Prediabetes: Secondary | ICD-10-CM | POA: Diagnosis not present

## 2021-03-13 DIAGNOSIS — Z96653 Presence of artificial knee joint, bilateral: Secondary | ICD-10-CM

## 2021-03-13 DIAGNOSIS — I10 Essential (primary) hypertension: Secondary | ICD-10-CM

## 2021-03-13 DIAGNOSIS — Z8673 Personal history of transient ischemic attack (TIA), and cerebral infarction without residual deficits: Secondary | ICD-10-CM

## 2021-03-13 DIAGNOSIS — G4733 Obstructive sleep apnea (adult) (pediatric): Secondary | ICD-10-CM

## 2021-03-13 DIAGNOSIS — E559 Vitamin D deficiency, unspecified: Secondary | ICD-10-CM

## 2021-03-13 LAB — POCT GLYCOSYLATED HEMOGLOBIN (HGB A1C): Hemoglobin A1C: 5.6 % (ref 4.0–5.6)

## 2021-03-13 MED ORDER — VITAMIN D (ERGOCALCIFEROL) 1.25 MG (50000 UNIT) PO CAPS: 50000.0000 [IU] | ORAL_CAPSULE | ORAL | 1 refills | Status: DC

## 2021-03-13 MED ORDER — ATORVASTATIN CALCIUM 10 MG PO TABS: 10.0000 mg | ORAL_TABLET | Freq: Every day | ORAL | 3 refills | Status: DC

## 2021-03-13 MED ORDER — METOPROLOL TARTRATE 100 MG PO TABS: 100.0000 mg | ORAL_TABLET | Freq: Two times a day (BID) | ORAL | 1 refills | Status: DC

## 2021-03-13 MED ORDER — OLMESARTAN MEDOXOMIL 40 MG PO TABS: 40.0000 mg | ORAL_TABLET | Freq: Every day | ORAL | 1 refills | Status: DC

## 2021-03-14 LAB — COMPLETE METABOLIC PANEL WITH GFR
AG Ratio: 1.4 (calc) (ref 1.0–2.5)
ALT: 11 U/L (ref 6–29)
AST: 13 U/L (ref 10–35)
Albumin: 3.8 g/dL (ref 3.6–5.1)
Alkaline phosphatase (APISO): 100 U/L (ref 37–153)
BUN: 12 mg/dL (ref 7–25)
CO2: 26 mmol/L (ref 20–32)
Calcium: 9.6 mg/dL (ref 8.6–10.4)
Chloride: 106 mmol/L (ref 98–110)
Creat: 0.74 mg/dL (ref 0.50–1.05)
Globulin: 2.8 g/dL (calc) (ref 1.9–3.7)
Glucose, Bld: 105 mg/dL — ABNORMAL HIGH (ref 65–99)
Potassium: 3.8 mmol/L (ref 3.5–5.3)
Sodium: 142 mmol/L (ref 135–146)
Total Bilirubin: 0.4 mg/dL (ref 0.2–1.2)
Total Protein: 6.6 g/dL (ref 6.1–8.1)
eGFR: 90 mL/min/{1.73_m2} (ref >=60)

## 2021-03-14 LAB — CBC WITH DIFFERENTIAL/PLATELET
Absolute Monocytes: 553 cells/uL (ref 200–950)
Basophils Absolute: 63 cells/uL (ref 0–200)
Basophils Relative: 0.8 %
Eosinophils Absolute: 119 cells/uL (ref 15–500)
Eosinophils Relative: 1.5 %
HCT: 38.8 % (ref 35.0–45.0)
Hemoglobin: 12.4 g/dL (ref 11.7–15.5)
Lymphs Abs: 1604 cells/uL (ref 850–3900)
MCH: 27.9 pg (ref 27.0–33.0)
MCHC: 32 g/dL (ref 32.0–36.0)
MCV: 87.2 fL (ref 80.0–100.0)
MPV: 10.9 fL (ref 7.5–12.5)
Monocytes Relative: 7 %
Neutro Abs: 5562 cells/uL (ref 1500–7800)
Neutrophils Relative %: 70.4 %
Platelets: 382 10*3/uL (ref 140–400)
RBC: 4.45 10*6/uL (ref 3.80–5.10)
RDW: 16.2 % — ABNORMAL HIGH (ref 11.0–15.0)
Total Lymphocyte: 20.3 %
WBC: 7.9 10*3/uL (ref 3.8–10.8)

## 2021-03-14 LAB — LIPID PANEL
Cholesterol: 130 mg/dL (ref <200)
HDL: 68 mg/dL (ref >=50)
LDL Cholesterol (Calc): 46 mg/dL (calc)
Non-HDL Cholesterol (Calc): 62 mg/dL (calc) (ref <130)
Total CHOL/HDL Ratio: 1.9 (calc) (ref <5.0)
Triglycerides: 77 mg/dL (ref <150)

## 2021-03-27 ENCOUNTER — Ambulatory Visit (INDEPENDENT_AMBULATORY_CARE_PROVIDER_SITE_OTHER): Payer: BC Managed Care – PPO | Admitting: Family Medicine

## 2021-03-27 ENCOUNTER — Other Ambulatory Visit: Payer: Self-pay

## 2021-03-27 ENCOUNTER — Encounter (INDEPENDENT_AMBULATORY_CARE_PROVIDER_SITE_OTHER): Payer: Self-pay | Admitting: Family Medicine

## 2021-03-27 VITALS — BP 130/74 | HR 74 | Temp 97.6°F | Ht 66.0 in | Wt 255.0 lb

## 2021-03-27 DIAGNOSIS — E1169 Type 2 diabetes mellitus with other specified complication: Secondary | ICD-10-CM

## 2021-03-27 DIAGNOSIS — I152 Hypertension secondary to endocrine disorders: Secondary | ICD-10-CM | POA: Diagnosis not present

## 2021-03-27 DIAGNOSIS — E1159 Type 2 diabetes mellitus with other circulatory complications: Secondary | ICD-10-CM

## 2021-03-27 DIAGNOSIS — Z6841 Body Mass Index (BMI) 40.0 and over, adult: Secondary | ICD-10-CM

## 2021-03-27 MED ORDER — OZEMPIC (1 MG/DOSE) 4 MG/3ML ~~LOC~~ SOPN: 1.0000 mg | PEN_INJECTOR | SUBCUTANEOUS | 0 refills | Status: DC

## 2021-03-27 NOTE — Progress Notes (Signed)
Chief Complaint:   OBESITY Regina Christensen is here to discuss her progress with her obesity treatment plan along with follow-up of her obesity related diagnoses. Regina Christensen is on the Category 1 Plan and states she is following her eating plan approximately 50% of the time. Regina Christensen states she is doing 0 minutes 0 times per week.  Today's visit was #: 9 Starting weight: 255 lbs Starting date: 05/10/2020 Today's weight: 255 lbs Today's date: 03/27/2021 Total lbs lost to date: 0 Total lbs lost since last in-office visit: 0  Interim History: Regina Christensen had left total knee replacement October 13,2022. It went well and she is doing physical therapy now. She notes she has not been doing well on plan. She reports poor protein intake. She is tired of eggs. She has been snacking on chips. She has also been drinking SSBs - regular ginger ale, and regular Lipton green tea.   Subjective:   1. Type 2 diabetes mellitus with other specified complication, without long-term current use of insulin (HCC) Well controlled. Regina Christensen was off of Ozempic for about 3 weeks due to forgetting it. She is unsure if it helps with appetite.  Lab Results  Component Value Date   HGBA1C 5.6 03/13/2021   HGBA1C 6.0 (H) 08/16/2020   HGBA1C 6.2 (H) 03/01/2020   Lab Results  Component Value Date   LDLCALC 46 03/13/2021   CREATININE 0.74 03/13/2021   No results found for: INSULIN   2. Essential hypertension Regina Christensen's blood pressure is well controlled. She is on Norvasc and Lopressor.  BP Readings from Last 3 Encounters:  03/27/21 130/74  03/13/21 132/80  01/17/21 120/80     Assessment/Plan:   1. Type 2 diabetes mellitus with other specified complication, without long-term current use of insulin (HCC) We will refill Ozempic 1 mg twice weekly. She will work on improving compliance.  - Semaglutide, 1 MG/DOSE, (OZEMPIC, 1 MG/DOSE,) 4 MG/3ML SOPN; Inject 1 mg into the skin 2 (two) times a week.  Dispense: 6 mL;  Refill: 0  2. Essential hypertension Regina Christensen will continue all her medications.   3. Obesity, current BMI 41.18 Regina Christensen is currently in the action stage of change. As such, her goal is to continue with weight loss efforts. She has agreed to the Category 1 Plan.   We discussed protein rich meal options.  Handout: Protein content of food was provided.  Goal for Regina Christensen is 20-30 grams of protein at each meal.  She will reduce sugar sweetened beverages to 1 mini can per day.   Exercise goals:  Regina Christensen will continue physical therapy 2 times per week.  Behavioral modification strategies: increasing lean protein intake, decreasing simple carbohydrates, decreasing liquid calories, and meal planning and cooking strategies.  Regina Christensen has agreed to follow-up with our clinic in 4 weeks.  Objective:   Blood pressure 130/74, pulse 74, temperature 97.6 F (36.4 C), height 5\' 6"  (1.676 m), weight 255 lb (115.7 kg), SpO2 97 %. Body mass index is 41.16 kg/m.  General: Cooperative, alert, well developed, in no acute distress. HEENT: Conjunctivae and lids unremarkable. Cardiovascular: Regular rhythm.  Lungs: Normal work of breathing. Neurologic: No focal deficits.   Lab Results  Component Value Date   CREATININE 0.74 03/13/2021   BUN 12 03/13/2021   NA 142 03/13/2021   K 3.8 03/13/2021   CL 106 03/13/2021   CO2 26 03/13/2021   Lab Results  Component Value Date   ALT 11 03/13/2021   AST 13 03/13/2021   ALKPHOS 111  11/14/2020   BILITOT 0.4 03/13/2021   Lab Results  Component Value Date   HGBA1C 5.6 03/13/2021   HGBA1C 6.0 (H) 08/16/2020   HGBA1C 6.2 (H) 03/01/2020   HGBA1C 6.3 (H) 11/29/2019   HGBA1C 6.3 (H) 02/03/2019   No results found for: INSULIN Lab Results  Component Value Date   TSH 1.250 11/14/2020   Lab Results  Component Value Date   CHOL 130 03/13/2021   HDL 68 03/13/2021   LDLCALC 46 03/13/2021   TRIG 77 03/13/2021   CHOLHDL 1.9 03/13/2021   Lab  Results  Component Value Date   VD25OH 37.3 11/14/2020   Lab Results  Component Value Date   WBC 7.9 03/13/2021   HGB 12.4 03/13/2021   HCT 38.8 03/13/2021   MCV 87.2 03/13/2021   PLT 382 03/13/2021   Lab Results  Component Value Date   IRON 39 11/14/2020   TIBC 329 11/14/2020   FERRITIN 25 11/14/2020   Attestation Statements:   Reviewed by clinician on day of visit: allergies, medications, problem list, medical history, surgical history, family history, social history, and previous encounter notes.  I, Jackson Latino, RMA, am acting as Energy manager for [place], FNP.   I have reviewed the above documentation for accuracy and completeness, and I agree with the above. -  Jesse Sans, FNP

## 2021-03-29 ENCOUNTER — Other Ambulatory Visit: Payer: Self-pay

## 2021-03-29 ENCOUNTER — Other Ambulatory Visit: Payer: Self-pay | Admitting: Family Medicine

## 2021-03-29 ENCOUNTER — Ambulatory Visit
Admission: RE | Admit: 2021-03-29 | Discharge: 2021-03-29 | Disposition: A | Discharge status: Home / Self Care | Payer: BC Managed Care – PPO | Source: Ambulatory Visit | Attending: Family Medicine | Admitting: Family Medicine

## 2021-03-29 DIAGNOSIS — Z1231 Encounter for screening mammogram for malignant neoplasm of breast: Secondary | ICD-10-CM

## 2021-04-08 ENCOUNTER — Ambulatory Visit: Payer: Self-pay | Admitting: *Deleted

## 2021-04-08 ENCOUNTER — Telehealth (INDEPENDENT_AMBULATORY_CARE_PROVIDER_SITE_OTHER): Payer: BC Managed Care – PPO | Admitting: Physician Assistant

## 2021-04-08 DIAGNOSIS — U071 COVID-19: Secondary | ICD-10-CM | POA: Diagnosis not present

## 2021-04-08 MED ORDER — BENZONATATE 100 MG PO CAPS: 100.0000 mg | ORAL_CAPSULE | Freq: Two times a day (BID) | ORAL | 0 refills | Status: DC | PRN

## 2021-04-08 NOTE — Telephone Encounter (Signed)
°  Chief Complaint: + COVID Symptoms: cough, chest congestion Frequency: symptoms started Thursday Pertinent Negatives: Patient denies fever,SOB Disposition: [] ED /[] Urgent Care (no appt availability in office) / [] Appointment(In office/virtual)/ []  [place] [facility]/ [x] Home Care/ [] Refused Recommended Disposition  Additional Notes: + COVID, moderate risk patient, cough and chest congestion- requesting medication for cough and antiviral.

## 2021-04-08 NOTE — Telephone Encounter (Signed)
Summary: Positive COVID / Cough   Patient experiencing cough, positive COVID on 04/07/2021, sneezing no other symptoms     Reason for Disposition  [1] HIGH RISK for severe COVID complications (e.g., weak immune system, age > 64 years, obesity with BMI > 25, pregnant, chronic lung disease or other chronic medical condition) AND [2] COVID symptoms (e.g., cough, fever)  (Exceptions: Already seen by PCP and no new or worsening symptoms.)  Answer Assessment - Initial Assessment Questions 1. COVID-19 DIAGNOSIS: "Who made your COVID-19 diagnosis?" "Was it confirmed by a positive lab test or self-test?" If not diagnosed by a doctor (or NP/PA), ask "Are there lots of cases (community spread) where you live?" Note: See public health department website, if unsure.     + COVID home test- yesterday 2. COVID-19 EXPOSURE: "Was there any known exposure to COVID before the symptoms began?" CDC Definition of close contact: within 6 feet (2 meters) for a total of 15 minutes or more over a 24-hour period.      unknown 3. ONSET: "When did the COVID-19 symptoms start?"      Thursday last week 4. WORST SYMPTOM: "What is your worst symptom?" (e.g., cough, fever, shortness of breath, muscle aches)     cough 5. COUGH: "Do you have a cough?" If Yes, ask: "How bad is the cough?"       Yes- chest sore from cough- clear 6. FEVER: "Do you have a fever?" If Yes, ask: "What is your temperature, how was it measured, and when did it start?"     no 7. RESPIRATORY STATUS: "Describe your breathing?" (e.g., shortness of breath, wheezing, unable to speak)      normal 8. BETTER-SAME-WORSE: "Are you getting better, staying the same or getting worse compared to yesterday?"  If getting worse, ask, "In what way?"     same 9. HIGH RISK DISEASE: "Do you have any chronic medical problems?" (e.g., asthma, heart or lung disease, weak immune system, obesity, etc.)     Heart diease, diabetes 10. VACCINE: "Have you had the COVID-19 vaccine?"  If Yes, ask: "Which one, how many shots, when did you get it?"       yes 11. BOOSTER: "Have you received your COVID-19 booster?" If Yes, ask: "Which one and when did you get it?"       Yes- over 6 months 12. PREGNANCY: "Is there any chance you are pregnant?" "When was your last menstrual period?"       na 13. OTHER SYMPTOMS: "Do you have any other symptoms?"  (e.g., chills, fatigue, headache, loss of smell or taste, muscle pain, sore throat)       Nasal stuffiness, sneezing 14. O2 SATURATION MONITOR:  "Do you use an oxygen saturation monitor (pulse oximeter) at home?" If Yes, ask "What is your reading (oxygen level) today?" "What is your usual oxygen saturation reading?" (e.g., 95%)       Yes- not sure where it is  Protocols used: Coronavirus (COVID-19) Diagnosed or Suspected-A-AH

## 2021-04-08 NOTE — Patient Instructions (Addendum)
Based on your symptoms and the positive COVID test you mentioned on the phone I am recommending that you do the following to manage your symptoms:  The goal of treatment at this time is to reduce your symptoms and discomfort  I have sent in Tessalon pearls for you to take twice per day to help with your cough You can use over the counter medications such as Dayquil/Nyquil, AlkaSeltzer formulations, etc to provide further relief of symptoms according to the manufacturer's instructions  You can also use Mucinex and the Tessalon Pearls together for symptom relief if you do not wish to use the recommendations above. You can use Tylenol and Ibuprofen to help with aches and pains but follow the manufacturer's instructions to avoid taking too much  Follow the recommended quarantine guidelines and wear a mask for at least 5 days after your quarantine period is complete.  If your symptoms do not improve or become worse in the next 5-7 days please make an apt at the office so we can see you  Go to the ER if you begin to have more serious symptoms such as shortness of breath, trouble breathing, loss of consciousness, high fever, severe lasting headaches, vision changes or neck pain/stiffness.

## 2021-04-08 NOTE — Progress Notes (Signed)
Virtual Visit via Telephone Note  I connected with Regina Christensen on 04/08/21 at  2:40 PM EST by telephone and verified that I am speaking with the correct person using two identifiers.  Location: Patient: Currently at home in [place], [place] Provider: [facility] in [place], [place]    I discussed the limitations, risks, security and privacy concerns of performing an evaluation and management service by telephone and the availability of in person appointments. I also discussed with the patient that there may be a patient responsible charge related to this service. The patient expressed understanding and agreed to proceed.   History of Present Illness: Patient presents via telephone for recent positive COVID test.   States Thurs she woke up with congestion and sinus drainage,   She had a positive COVID test after symptoms began.  Reports she has current congestion, cough, and chest soreness from cough.  Denies SOB, difficulty breathing, fever, body aches, headaches, dizziness.    Observations/Objective:  Due to nature of telephone visit, physical exam, observations are limited to what could be heard over phone.  Able to hold full conversation via phone without audible signs of distress    Assessment and Plan:  1. COVID-19 virus infection Patient reports symptoms and positive COVID 19 test.  Provided education on appropriate at home measures for symptomatic relief that includes Coricidin, Mucinex, and/or Dayquil/Nyquil, Theraflu per patient preference.  Rx for Tessalon pearls sent in for cough suppression Precautions reviewed for when patient should seek emergency services.  Antiviral medications considered but due to patient being on 4th day of symptoms and apparent mild symptom severity coupled with multiple drug interactions with current regimen, antivirals were decided against at this time.   Follow Up Instructions:    I discussed the assessment and treatment  plan with the patient. The patient was provided an opportunity to ask questions and all were answered. The patient agreed with the plan and demonstrated an understanding of the instructions.   The patient was advised to call back or seek an in-person evaluation if the symptoms worsen or if the condition fails to improve as anticipated.  I provided 25 minutes of non-face-to-face time during this encounter.   Sofia Jaquith E Joni Norrod, PA-C

## 2021-04-17 ENCOUNTER — Encounter (INDEPENDENT_AMBULATORY_CARE_PROVIDER_SITE_OTHER): Payer: Self-pay | Admitting: Adult Health

## 2021-04-17 ENCOUNTER — Ambulatory Visit (INDEPENDENT_AMBULATORY_CARE_PROVIDER_SITE_OTHER): Payer: BC Managed Care – PPO | Admitting: Adult Health

## 2021-04-17 ENCOUNTER — Other Ambulatory Visit: Payer: Self-pay

## 2021-04-17 VITALS — BP 131/78 | HR 65 | Temp 98.1°F | Ht 66.0 in | Wt 257.0 lb

## 2021-04-17 DIAGNOSIS — Z8673 Personal history of transient ischemic attack (TIA), and cerebral infarction without residual deficits: Secondary | ICD-10-CM | POA: Diagnosis not present

## 2021-04-17 DIAGNOSIS — Z6841 Body Mass Index (BMI) 40.0 and over, adult: Secondary | ICD-10-CM | POA: Diagnosis not present

## 2021-04-17 DIAGNOSIS — E1169 Type 2 diabetes mellitus with other specified complication: Secondary | ICD-10-CM

## 2021-04-17 DIAGNOSIS — Z9189 Other specified personal risk factors, not elsewhere classified: Secondary | ICD-10-CM

## 2021-04-17 MED ORDER — OZEMPIC (1 MG/DOSE) 4 MG/3ML ~~LOC~~ SOPN
PEN_INJECTOR | SUBCUTANEOUS | 0 refills | Status: DC
Start: 2021-04-17 — End: 2021-05-13

## 2021-04-17 NOTE — Progress Notes (Signed)
Chief Complaint:   OBESITY Regina Christensen is here to discuss her progress with her obesity treatment plan along with follow-up of her obesity related diagnoses. Regina Christensen is on the Category 1 Plan and states she is following her eating plan approximately 70% of the time. Regina Christensen states she is doing increased walking.  Today's visit was #: 10 Starting weight: 255 lbs Starting date: 05/10/2020 Today's weight: 257 lbs Today's date: 04/17/2021 Total lbs lost to date: 0 Total lbs lost since last in-office visit: 0  Interim History:  Regina Christensen's PCP started her on atorvastatin 10mg  QD on 03/13/2021 - 02/28/20 MRI Impression: IMPRESSION: 2021 1.  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  2.  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  3.  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    She continues to be challenged to consistently take Ozempic weekly and at correct dose. When she does take it - only 1 mg/week, not the ordered 1mg  twice week. She was able to tolerate 2mg  injection when she was able to obtain the 2mg  Ozmepic strength- unable to find due to national drug shortage.  Subjective:   1. Type 2 diabetes mellitus with other specified complication, without long-term current use of insulin (HCC) On 03/27/2021, Regina Christensen was restarted on Ozempic 1 mg twice weekly after 3 weeks off due to forgetting to take injection therapy. She took two doses of 1 mg in the last 3 weeks. She is able to tolerate 2mg  Ozempic at once. She denies mass in neck, dysphagia, dyspepsia, persistent hoarseness, or GI upset.  2. History of CVA (cerebrovascular accident) without residual deficits Daisha's PCP started her on atorvastatin 10mg  QD on 03/13/2021 - 02/28/20 MRI Impression: IMPRESSION: 2021 1.  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  2.  Moderate leukoaraiosis, most likely due to chronic  microvascular disease.  3.  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    3. At risk for hyperglycemia Regina Christensen is at risk for hyperglycemia due to inconsistent use of Ozempic.  Assessment/Plan:   1. Type 2 diabetes mellitus with other specified complication, without long-term current use of insulin (HCC) Refill Ozempic 1 mg - inject 2 mg (2 x 1 mg)on Sunday).  - Refill Semaglutide, 1 MG/DOSE, (OZEMPIC, 1 MG/DOSE,) 4 MG/3ML SOPN; Inject 2mg  on Sunday ( 2 x 1mg  injections)  Dispense: 6 mL; Refill: 0  Place reminder notes in kitchen and bathroom to help with correct dosage and compliance- notes provided today.  2. History of CVA (cerebrovascular accident) without residual deficits Continue atorvastatin 10 mg daily per PCP.  3. At risk for hyperglycemia Regina Christensen was given approximately 15 minutes of counseling today regarding prevention of hyperglycemia. She was advised of hyperglycemia causes and the fact hyperglycemia is often asymptomatic. Regina Christensen was instructed to avoid skipping meals, eat regular protein rich meals and schedule low calorie but protein rich snacks as needed.   Repetitive spaced learning was employed today to elicit superior memory formation and behavioral change   4. Obesity, current BMI 41.5  Regina Christensen is currently in the action stage of change. As such, her goal is to continue with weight loss efforts. She has agreed to the Category 1 Plan.   Exercise goals:  As is.  Behavioral modification strategies: increasing lean protein intake, decreasing simple carbohydrates, no skipping meals, meal planning and cooking strategies, keeping healthy foods in the home, and planning for success.  Regina Christensen has agreed to  follow-up with our clinic in 3 weeks. She was informed of the importance of frequent follow-up visits to maximize her success with intensive lifestyle modifications for her multiple health conditions.   Objective:   Blood  pressure 131/78, pulse 65, temperature 98.1 F (36.7 C), temperature source Oral, height 5\' 6"  (1.676 m), weight 257 lb (116.6 kg), SpO2 98 %. Body mass index is 41.48 kg/m.  General: Cooperative, alert, well developed, in no acute distress. HEENT: Conjunctivae and lids unremarkable. Cardiovascular: Regular rhythm.  Lungs: Normal work of breathing. Neurologic: No focal deficits.   Lab Results  Component Value Date   CREATININE 0.74 03/13/2021   BUN 12 03/13/2021   NA 142 03/13/2021   K 3.8 03/13/2021   CL 106 03/13/2021   CO2 26 03/13/2021   Lab Results  Component Value Date   ALT 11 03/13/2021   AST 13 03/13/2021   ALKPHOS 111 11/14/2020   BILITOT 0.4 03/13/2021   Lab Results  Component Value Date   HGBA1C 5.6 03/13/2021   HGBA1C 6.0 (H) 08/16/2020   HGBA1C 6.2 (H) 03/01/2020   HGBA1C 6.3 (H) 11/29/2019   HGBA1C 6.3 (H) 02/03/2019   Lab Results  Component Value Date   TSH 1.250 11/14/2020   Lab Results  Component Value Date   CHOL 130 03/13/2021   HDL 68 03/13/2021   LDLCALC 46 03/13/2021   TRIG 77 03/13/2021   CHOLHDL 1.9 03/13/2021   Lab Results  Component Value Date   VD25OH 37.3 11/14/2020   Lab Results  Component Value Date   WBC 7.9 03/13/2021   HGB 12.4 03/13/2021   HCT 38.8 03/13/2021   MCV 87.2 03/13/2021   PLT 382 03/13/2021   Lab Results  Component Value Date   IRON 39 11/14/2020   TIBC 329 11/14/2020   FERRITIN 25 11/14/2020   Attestation Statements:   Reviewed by clinician on day of visit: allergies, medications, problem list, medical history, surgical history, family history, social history, and previous encounter notes.  I, Water quality scientist, CMA, am acting as Location manager for Mina Marble, NP.  I have reviewed the above documentation for accuracy and completeness, and I agree with the above. -  Doyle Tegethoff d. Jerrine Urschel, NP-C

## 2021-05-13 ENCOUNTER — Ambulatory Visit (INDEPENDENT_AMBULATORY_CARE_PROVIDER_SITE_OTHER): Payer: BC Managed Care – PPO | Admitting: Family Medicine

## 2021-05-13 ENCOUNTER — Encounter (INDEPENDENT_AMBULATORY_CARE_PROVIDER_SITE_OTHER): Payer: Self-pay | Admitting: Family Medicine

## 2021-05-13 ENCOUNTER — Other Ambulatory Visit: Payer: Self-pay

## 2021-05-13 VITALS — BP 134/84 | HR 78 | Temp 97.9°F | Ht 66.0 in | Wt 252.0 lb

## 2021-05-13 DIAGNOSIS — E65 Localized adiposity: Secondary | ICD-10-CM | POA: Diagnosis not present

## 2021-05-13 DIAGNOSIS — Z8673 Personal history of transient ischemic attack (TIA), and cerebral infarction without residual deficits: Secondary | ICD-10-CM

## 2021-05-13 DIAGNOSIS — E559 Vitamin D deficiency, unspecified: Secondary | ICD-10-CM | POA: Diagnosis not present

## 2021-05-13 DIAGNOSIS — Z96652 Presence of left artificial knee joint: Secondary | ICD-10-CM

## 2021-05-13 DIAGNOSIS — E669 Obesity, unspecified: Secondary | ICD-10-CM | POA: Diagnosis not present

## 2021-05-13 DIAGNOSIS — Z599 Problem related to housing and economic circumstances, unspecified: Secondary | ICD-10-CM

## 2021-05-13 DIAGNOSIS — E1169 Type 2 diabetes mellitus with other specified complication: Secondary | ICD-10-CM

## 2021-05-13 DIAGNOSIS — Z6841 Body Mass Index (BMI) 40.0 and over, adult: Secondary | ICD-10-CM

## 2021-05-13 MED ORDER — TIRZEPATIDE 5 MG/0.5ML ~~LOC~~ SOAJ: 5.0000 mg | SUBCUTANEOUS | 0 refills | Status: DC

## 2021-05-13 MED ORDER — OZEMPIC (2 MG/DOSE) 8 MG/3ML ~~LOC~~ SOPN: 2.0000 mg | PEN_INJECTOR | SUBCUTANEOUS | 1 refills | Status: DC

## 2021-05-15 ENCOUNTER — Other Ambulatory Visit: Payer: Self-pay | Admitting: Psychiatry

## 2021-05-15 DIAGNOSIS — F3342 Major depressive disorder, recurrent, in full remission: Secondary | ICD-10-CM

## 2021-05-15 DIAGNOSIS — F411 Generalized anxiety disorder: Secondary | ICD-10-CM

## 2021-05-15 NOTE — Progress Notes (Signed)
Chief Complaint:   OBESITY Patriece is here to discuss her progress with her obesity treatment plan along with follow-up of her obesity related diagnoses. See Medical Weight Management Flowsheet for complete bioelectrical impedance results.  Today's visit was #: 11 Starting weight: 255 lbs Starting date: 05/10/2020 Weight change since last visit: 5 lbs Total lbs lost to date: 3 lbs Total weight loss percentage to date: -1.18%  Nutrition Plan: Category 1 Plan for 80% of the time.  Activity: None. Anti-obesity medications: Ozempic 2 mg subcutaneously weekly. Reported side effects: None.  Interim History: Lindaann is disappointed that she is not closer to her goal.  Assessment/Plan:   1. Type 2 diabetes mellitus with other specified complication, without long-term current use of insulin (HCC) Diabetes Mellitus: At goal. Medication: Ozempic 2 mg subcutaneously weekly. Issues reviewed: blood sugar goals, complications of diabetes mellitus, hypoglycemia prevention and treatment, exercise, and nutrition.  Plan:  Continue Ozempic 2 mg subcutaneously weekly or start Mounajro 5 mg subcutaneously weekly. The patient will continue to focus on protein-rich, low simple carbohydrate foods. We reviewed the importance of hydration, regular exercise for stress reduction, and restorative sleep.   Lab Results  Component Value Date   HGBA1C 5.6 03/13/2021   HGBA1C 6.0 (H) 08/16/2020   HGBA1C 6.2 (H) 03/01/2020   Lab Results  Component Value Date   LDLCALC 46 03/13/2021   CREATININE 0.74 03/13/2021   - Start tirzepatide [facility]) 5 MG/0.5ML Pen; Inject 5 mg into the skin once a week.  Dispense: 1 mL; Refill: 0 - Refill Semaglutide, 2 MG/DOSE, (OZEMPIC, 2 MG/DOSE,) 8 MG/3ML SOPN; Inject 2 mg into the skin once a week.  Dispense: 3 mL; Refill: 1  2. Vitamin D deficiency Not at goal. She is taking vitamin D 50,000 IU weekly.  Plan: Continue to take prescription Vitamin D @50 ,000 IU every  week as prescribed.  Follow-up for routine testing of Vitamin D, at least 2-3 times per year to avoid over-replacement.  Lab Results  Component Value Date   VD25OH 37.3 11/14/2020   3. Visceral obesity Current visceral fat rating: 17. Visceral fat rating goal is < 13. Visceral adipose tissue is a hormonally active component of total body fat. This body composition phenotype is associated with medical disorders such as metabolic syndrome, cardiovascular disease, and several malignancies including prostate, breast, and colorectal cancers. Starting goal: Lose 7-10% of starting weight.   4. History of CVA (cerebrovascular accident) without residual deficits Taking Eliquis 4 mg twice daily.  Followed by Cardiology.  5. History of total left knee replacement Shivali is still in PT.  6. Financial difficulties She may qualify for patient assistance.   7. Obesity, current BMI 40.8  Course: Yoshie is currently in the action stage of change. As such, her goal is to continue with weight loss efforts.   Nutrition goals: She has agreed to the Category 1 Plan.   Exercise goals:  As tolerated.  Behavioral modification strategies: increasing lean protein intake, decreasing simple carbohydrates, increasing vegetables, increasing water intake, and decreasing liquid calories.  Gennett has agreed to follow-up with our clinic in 4 weeks. She was informed of the importance of frequent follow-up visits to maximize her success with intensive lifestyle modifications for her multiple health conditions.   Objective:   Blood pressure 134/84, pulse 78, temperature 97.9 F (36.6 C), temperature source Oral, height 5\' 6"  (1.676 m), weight 252 lb (114.3 kg), SpO2 96 %. Body mass index is 40.67 kg/m.  General: Cooperative, alert,  well developed, in no acute distress. HEENT: Conjunctivae and lids unremarkable. Cardiovascular: Regular rhythm.  Lungs: Normal work of breathing. Neurologic: No focal  deficits.   Lab Results  Component Value Date   CREATININE 0.74 03/13/2021   BUN 12 03/13/2021   NA 142 03/13/2021   K 3.8 03/13/2021   CL 106 03/13/2021   CO2 26 03/13/2021   Lab Results  Component Value Date   ALT 11 03/13/2021   AST 13 03/13/2021   ALKPHOS 111 11/14/2020   BILITOT 0.4 03/13/2021   Lab Results  Component Value Date   HGBA1C 5.6 03/13/2021   HGBA1C 6.0 (H) 08/16/2020   HGBA1C 6.2 (H) 03/01/2020   HGBA1C 6.3 (H) 11/29/2019   HGBA1C 6.3 (H) 02/03/2019   Lab Results  Component Value Date   TSH 1.250 11/14/2020   Lab Results  Component Value Date   CHOL 130 03/13/2021   HDL 68 03/13/2021   LDLCALC 46 03/13/2021   TRIG 77 03/13/2021   CHOLHDL 1.9 03/13/2021   Lab Results  Component Value Date   VD25OH 37.3 11/14/2020   Lab Results  Component Value Date   WBC 7.9 03/13/2021   HGB 12.4 03/13/2021   HCT 38.8 03/13/2021   MCV 87.2 03/13/2021   PLT 382 03/13/2021   Lab Results  Component Value Date   IRON 39 11/14/2020   TIBC 329 11/14/2020   FERRITIN 25 11/14/2020   Attestation Statements:   Reviewed by clinician on day of visit: allergies, medications, problem list, medical history, surgical history, family history, social history, and previous encounter notes.  I, Insurance claims handler, CMA, am acting as transcriptionist for Helane Rima, DO  I have reviewed the above documentation for accuracy and completeness, and I agree with the above. -  Helane Rima, DO, MS, FAAFP, DABOM - Family and Bariatric Medicine.

## 2021-06-03 ENCOUNTER — Other Ambulatory Visit: Payer: Self-pay

## 2021-06-03 ENCOUNTER — Ambulatory Visit (INDEPENDENT_AMBULATORY_CARE_PROVIDER_SITE_OTHER): Payer: BC Managed Care – PPO | Admitting: Adult Health

## 2021-06-03 ENCOUNTER — Encounter (INDEPENDENT_AMBULATORY_CARE_PROVIDER_SITE_OTHER): Payer: Self-pay | Admitting: Adult Health

## 2021-06-03 VITALS — BP 128/74 | HR 66 | Temp 98.0°F | Ht 66.0 in | Wt 250.0 lb

## 2021-06-03 DIAGNOSIS — Z7984 Long term (current) use of oral hypoglycemic drugs: Secondary | ICD-10-CM

## 2021-06-03 DIAGNOSIS — I152 Hypertension secondary to endocrine disorders: Secondary | ICD-10-CM

## 2021-06-03 DIAGNOSIS — E669 Obesity, unspecified: Secondary | ICD-10-CM | POA: Diagnosis not present

## 2021-06-03 DIAGNOSIS — E1169 Type 2 diabetes mellitus with other specified complication: Secondary | ICD-10-CM | POA: Diagnosis not present

## 2021-06-03 DIAGNOSIS — E1159 Type 2 diabetes mellitus with other circulatory complications: Secondary | ICD-10-CM | POA: Diagnosis not present

## 2021-06-03 DIAGNOSIS — Z6841 Body Mass Index (BMI) 40.0 and over, adult: Secondary | ICD-10-CM

## 2021-06-03 DIAGNOSIS — E66813 Obesity, class 3: Secondary | ICD-10-CM

## 2021-06-03 NOTE — Progress Notes (Signed)
Chief Complaint:   OBESITY Regina Christensen is here to discuss her progress with her obesity treatment plan along with follow-up of her obesity related diagnoses. Regina Christensen is on the Category 1 Plan and states she is following her eating plan approximately 85-90% of the time. Regina Christensen states she is not exercising regularly at this time.  Today's visit was #: 12 Starting weight: 255 lbs Starting date: 05/10/2020 Today's weight: 250 lbs Today's date: 06/03/2021 Total lbs lost to date: 5 lbs Total lbs lost since last in-office visit: 2 lbs  Interim History: Regina Christensen was unable to obtain her Mounjaro prescription to replace Ozempic 2 mg -  Been on GLP-1 since 2018.  Nausea will develop intermittently- she is unsure of she is overeating when the nausea without vomiting will occur.  Of note - ON 06/22/2021 - will turn 66!  Subjective:   1. Type 2 diabetes mellitus with other specified complication, without long-term current use of insulin (HCC) On 03/13/2021, A1c 5.6 - at goal - On Ozempic 2 mg. Injects on Sundays - been on Ozempic since 2018. Fasting BG 70-93. She denies mass in neck, dysphagia, dyspepsia, persistent hoarseness, abd pain, or constipation. She will have new insurance coverage 19 June 2021.  2. Hypertension associated with type 2 diabetes mellitus (HCC) BP/HR excellent at office visit.  Assessment/Plan:   1. Type 2 diabetes mellitus with other specified complication, without long-term current use of insulin (HCC) Remain on Ozempic 2 mg - consider converting to [facility] after new insurance. New insurance plan starts on June 19, 2021.  2. Hypertension associated with type 2 diabetes mellitus (HCC) Continue current antihypertensive regimen.  3. Obesity, current BMI 40.5  Regina Christensen is currently in the action stage of change. As such, her goal is to continue with weight loss efforts. She has agreed to the Category 1 Plan.   Exercise goals:  Increase daily  walking.  Behavioral modification strategies: increasing lean protein intake, decreasing simple carbohydrates, meal planning and cooking strategies, keeping healthy foods in the home, and planning for success.  Regina Christensen has agreed to follow-up with our clinic in 4 weeks. She was informed of the importance of frequent follow-up visits to maximize her success with intensive lifestyle modifications for her multiple health conditions.   Objective:   Blood pressure 128/74, pulse 66, temperature 98 F (36.7 C), height 5\' 6"  (1.676 m), weight 250 lb (113.4 kg), SpO2 98 %. Body mass index is 40.35 kg/m.  General: Cooperative, alert, well developed, in no acute distress. HEENT: Conjunctivae and lids unremarkable. Cardiovascular: Regular rhythm.  Lungs: Normal work of breathing. Neurologic: No focal deficits.   Lab Results  Component Value Date   CREATININE 0.74 03/13/2021   BUN 12 03/13/2021   NA 142 03/13/2021   K 3.8 03/13/2021   CL 106 03/13/2021   CO2 26 03/13/2021   Lab Results  Component Value Date   ALT 11 03/13/2021   AST 13 03/13/2021   ALKPHOS 111 11/14/2020   BILITOT 0.4 03/13/2021   Lab Results  Component Value Date   HGBA1C 5.6 03/13/2021   HGBA1C 6.0 (H) 08/16/2020   HGBA1C 6.2 (H) 03/01/2020   HGBA1C 6.3 (H) 11/29/2019   HGBA1C 6.3 (H) 02/03/2019   Lab Results  Component Value Date   TSH 1.250 11/14/2020   Lab Results  Component Value Date   CHOL 130 03/13/2021   HDL 68 03/13/2021   LDLCALC 46 03/13/2021   TRIG 77 03/13/2021   CHOLHDL 1.9 03/13/2021  Lab Results  Component Value Date   VD25OH 37.3 11/14/2020   Lab Results  Component Value Date   WBC 7.9 03/13/2021   HGB 12.4 03/13/2021   HCT 38.8 03/13/2021   MCV 87.2 03/13/2021   PLT 382 03/13/2021   Lab Results  Component Value Date   IRON 39 11/14/2020   TIBC 329 11/14/2020   FERRITIN 25 11/14/2020   Attestation Statements:   Reviewed by clinician on day of visit: allergies,  medications, problem list, medical history, surgical history, family history, social history, and previous encounter notes.  Time spent on visit including pre-visit chart review and post-visit care and charting was 27 minutes.   I, Insurance claims handler, CMA, am acting as Energy manager for William Hamburger, NP.  I have reviewed the above documentation for accuracy and completeness, and I agree with the above. -  Fiana Gladu d. Evelette Hollern, NP-C

## 2021-06-11 ENCOUNTER — Ambulatory Visit: Payer: BC Managed Care – PPO | Admitting: Psychiatry

## 2021-06-28 ENCOUNTER — Telehealth (INDEPENDENT_AMBULATORY_CARE_PROVIDER_SITE_OTHER): Payer: Medicare HMO | Admitting: Psychiatry

## 2021-06-28 ENCOUNTER — Encounter: Payer: Self-pay | Admitting: Psychiatry

## 2021-06-28 ENCOUNTER — Other Ambulatory Visit: Payer: Self-pay

## 2021-06-28 DIAGNOSIS — F5101 Primary insomnia: Secondary | ICD-10-CM | POA: Diagnosis not present

## 2021-06-28 DIAGNOSIS — F411 Generalized anxiety disorder: Secondary | ICD-10-CM

## 2021-06-28 DIAGNOSIS — R69 Illness, unspecified: Secondary | ICD-10-CM | POA: Diagnosis not present

## 2021-06-28 DIAGNOSIS — F3342 Major depressive disorder, recurrent, in full remission: Secondary | ICD-10-CM | POA: Diagnosis not present

## 2021-06-28 MED ORDER — ZOLPIDEM TARTRATE 5 MG PO TABS: 2.5000 mg | ORAL_TABLET | Freq: Every evening | ORAL | 1 refills | Status: DC | PRN

## 2021-06-28 NOTE — Progress Notes (Signed)
Virtual Visit via Video Note  I connected with Regina Christensen on 06/28/21 at  8:30 AM EST by a video enabled telemedicine application and verified that I am speaking with the correct person using two identifiers.  Location Provider Location : [facility] Patient Location : Home  Participants: Patient , Provider   I discussed the limitations of evaluation and management by telemedicine and the availability of in person appointments. The patient expressed understanding and agreed to proceed.    I discussed the assessment and treatment plan with the patient. The patient was provided an opportunity to ask questions and all were answered. The patient agreed with the plan and demonstrated an understanding of the instructions.   The patient was advised to call back or seek an in-person evaluation if the symptoms worsen or if the condition fails to improve as anticipated.   BH MD OP Progress Note  06/28/2021 8:56 AM [name]  MRN:  [id number]  Chief Complaint:  Chief Complaint  Patient presents with   Follow-up: 66 year old African-American female with history of GAD, MDD, insomnia, presented for medication management.   HPI: Regina Christensen is a 66 year old female, retired, lives in [place], has a history of GAD, MDD, hypertension, gastric bypass, hyperlipidemia, paroxysmal atrial fibrillation, OSA, arthritis, total arthroplasty of left knee, insomnia was evaluated by telemedicine today.  Patient reports overall mood symptoms are stable.  Currently compliant on the venlafaxine.  Denies side effects.  Patient does report sleep problems.  She reports 2-3 times a week she has sleep issues at night.  She reports sleep is interrupted, stays awake for an hour.  Watches TV during that time which helps her to fall asleep.  Otherwise denies any other concerns.  Has tried and failed medications like trazodone, rozerem.  Patient currently recovering from her left knee replacement surgery,  continues to have mild stiffness although improving.  Denies any suicidality, homicidality or perceptual disturbances.  Patient denies any other concerns today.  Visit Diagnosis:    ICD-10-CM   1. GAD (generalized anxiety disorder)  F41.1     2. MDD (major depressive disorder), recurrent, in full remission (HCC)  F33.42     3. Primary insomnia  F51.01 zolpidem (AMBIEN) 5 MG tablet      Past Psychiatric History: Reviewed past psychiatric history from progress note on 01/27/2019.  Past trials of Prozac, Effexor, trazodone, rozerem.  Past Medical History:  Past Medical History:  Diagnosis Date   Anxiety    Arthritis    Back pain    BPPV (benign paroxysmal positional vertigo)    Chronic diastolic HF (heart failure) (HCC)    Complication of anesthesia    Post-operative hypoxia requiring supplemental oxygen   Current use of long term anticoagulation    Apixaban   CVA (cerebral vascular accident) (HCC) 02/28/2020   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; imaging from [facility]   Depression    Edema of both lower extremities    GERD (gastroesophageal reflux disease)    Hypertension    IBS (irritable bowel syndrome)    Lactose intolerance    Morbid obesity with BMI of 45.0-49.9, adult (HCC)    Obesity    Osteoarthritis    PAF (paroxysmal atrial fibrillation) (HCC)    Pre-diabetes    Sleep apnea    uses CPAP machine sometimes    Type 2 diabetes mellitus without complication (HCC)    Vitamin D deficiency     Past Surgical History:  Procedure Laterality Date  ABDOMINAL HYSTERECTOMY     BREAST EXCISIONAL BIOPSY Left    ENDOMETRIAL ABLATION     x2   ENDOSCOPIC CONCHA BULLOSA RESECTION Bilateral 08/08/2020   Procedure: ENDOSCOPIC CONCHA BULLOSA RESECTION;  Surgeon: Vernie Murders, MD;  Location: [facility] ORS;  Service: ENT;  Laterality: Bilateral;   ETHMOIDECTOMY Right 08/08/2020   Procedure: ETHMOIDECTOMY, LEFT PARTIAL ETHMOIDECTOMY;  Surgeon: Vernie Murders, MD;  Location: [facility]  ORS;  Service: ENT;  Laterality: Right;   IMAGE GUIDED SINUS SURGERY N/A 08/08/2020   Procedure: IMAGE GUIDED SINUS SURGERY, RIGHT FRONTAL SINUSOTOMY;  Surgeon: Vernie Murders, MD;  Location: [facility] ORS;  Service: ENT;  Laterality: N/A;   LAPAROSCOPIC SLEEVE GASTRECTOMY     MAXILLARY ANTROSTOMY Bilateral 08/08/2020   Procedure: MAXILLARY ANTROSTOMY with tissue;  Surgeon: Vernie Murders, MD;  Location: [facility] ORS;  Service: ENT;  Laterality: Bilateral;   NASAL TURBINATE REDUCTION  08/27/2020   Procedure: TURBINATE REDUCTION /SUBMUCOSAL DEBRIDEMENT;  Surgeon: Vernie Murders, MD;  Location: [facility] ORS;  Service: ENT;;   TOTAL KNEE ARTHROPLASTY Right 06/2019   TOTAL KNEE ARTHROPLASTY Left 01/31/2021   [facility]    Family Psychiatric History: Reviewed family psychiatric history from progress note on 01/27/2019.  Family History:  Family History  Problem Relation Age of Onset   Diabetes Mother    High blood pressure Mother    Heart disease Father    Cancer Brother    Mental illness Neg Hx     Social History: Reviewed social history from progress note on 01/27/2019. Social History   Socioeconomic History   Marital status: Divorced    Spouse name: Not on file   Number of children: 2   Years of education: Not on file   Highest education level: Associate degree: occupational, Scientist, product/process development, or vocational program  Occupational History   Occupation: Retired/ work PT    Employer: [employer]  Tobacco Use   Smoking status: Never   Smokeless tobacco: Never  Vaping Use   Vaping Use: Never used  Substance and Sexual Activity   Alcohol use: No    Alcohol/week: 0.0 standard drinks    Comment: rare   Drug use: No   Sexual activity: Not on file  Other Topics Concern   Not on file  Social History Narrative   Daughter lives with her    Stressed at work, needs to meet quotas on her first 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 )    Social Determinants of Health    Financial Resource Strain: Not on file  Food Insecurity: Not on file  Transportation Needs: Not on file  Physical Activity: Not on file  Stress: Not on file  Social Connections: Not on file    Allergies:  Allergies  Allergen Reactions   Hydrocodone-Acetaminophen Other (See Comments)    Extreme headache   Ace Inhibitors Cough   Hctz [Hydrochlorothiazide] Rash    face    Metabolic Disorder Labs: Lab Results  Component Value Date   HGBA1C 5.6 03/13/2021   MPG 125.5 08/16/2020   MPG 131.24 03/01/2020   No results found for: PROLACTIN Lab Results  Component Value Date   CHOL 130 03/13/2021   TRIG 77 03/13/2021   HDL 68 03/13/2021   CHOLHDL 1.9 03/13/2021   VLDL 14 03/01/2020   LDLCALC 46 03/13/2021   LDLCALC 49 03/01/2020   Lab Results  Component Value Date   TSH 1.250 11/14/2020   TSH 1.358 03/01/2020    Therapeutic Level Labs:  No results found for: LITHIUM No results found for: VALPROATE No components found for:  CBMZ  Current Medications: Current Outpatient Medications  Medication Sig Dispense Refill   zolpidem (AMBIEN) 5 MG tablet Take 0.5-1 tablets (2.5-5 mg total) by mouth at bedtime as needed for sleep. 10 tablet 1   acetaminophen (TYLENOL) 650 MG CR tablet Take 1 tablet (650 mg total) by mouth every 8 (eight) hours as needed for pain.     amLODipine (NORVASC) 10 MG tablet TAKE 1 TABLET BY MOUTH EVERY DAY 90 tablet 1   amoxicillin (AMOXIL) 500 MG tablet amoxicillin 500 mg tablet  TAKE 4 TABLETS BY ORAL ROUTE 1 HOUR BEFORE DENTIST OR OTHER PROCEDURES     apixaban (ELIQUIS) 5 MG TABS tablet Take 1 tablet (5 mg total) by mouth 2 (two) times daily. 180 tablet 1   atorvastatin (LIPITOR) 10 MG tablet Take 1 tablet (10 mg total) by mouth daily. 90 tablet 3   benzonatate (TESSALON) 100 MG capsule Take 1 capsule (100 mg total) by mouth 2 (two) times daily as needed for cough. 20 capsule 0   famotidine (PEPCID) 20 MG tablet Take 1 tablet (20 mg total) by mouth  daily. 90 tablet 1   meclizine (ANTIVERT) 12.5 MG tablet Take 1 tablet (12.5 mg total) by mouth 3 (three) times daily as needed for dizziness. 30 tablet 0   metoprolol tartrate (LOPRESSOR) 100 MG tablet Take 1 tablet (100 mg total) by mouth 2 (two) times daily. 180 tablet 1   olmesartan (BENICAR) 40 MG tablet Take 1 tablet (40 mg total) by mouth daily. 90 tablet 1   oxyCODONE (OXY IR/ROXICODONE) 5 MG immediate release tablet oxycodone 5 mg tablet  1 PO Q4H PRN PAIN     Semaglutide, 2 MG/DOSE, (OZEMPIC, 2 MG/DOSE,) 8 MG/3ML SOPN Inject 2 mg into the skin once a week. 3 mL 1   tirzepatide (MOUNJARO) 5 MG/0.5ML Pen Inject 5 mg into the skin once a week. 1 mL 0   tiZANidine (ZANAFLEX) 4 MG tablet tizanidine 4 mg tablet     triamcinolone (NASACORT) 55 MCG/ACT AERO nasal inhaler Place 2 sprays into the nose daily.     venlafaxine XR (EFFEXOR-XR) 37.5 MG 24 hr capsule TAKE 1 CAPSULE (37.5 MG TOTAL) BY MOUTH DAILY WITH BREAKFAST. TAKE ALONG WITH 75 MG DAILY 90 capsule 1   venlafaxine XR (EFFEXOR-XR) 75 MG 24 hr capsule TAKE 1 CAPSULE (75 MG TOTAL) BY MOUTH DAILY WITH BREAKFAST. TAKE ALONG WITH 37.5 MG DAILY 90 capsule 1   Vitamin D, Ergocalciferol, (DRISDOL) 1.25 MG (50000 UNIT) CAPS capsule Take 1 capsule (50,000 Units total) by mouth every 7 (seven) days. 12 capsule 1   XIIDRA 5 % SOLN Place 1 drop into both eyes 2 (two) times daily.  4   No current facility-administered medications for this visit.     Musculoskeletal: Strength & Muscle Tone:  UTA Gait & Station:  Seated Patient leans: N/A  Psychiatric Specialty Exam: Review of Systems  Musculoskeletal:        Left knee stiffness s/p surgery  Psychiatric/Behavioral:  Positive for sleep disturbance.   All other systems reviewed and are negative.  There were no vitals taken for this visit.There is no height or weight on file to calculate BMI.  General Appearance: Casual  Eye Contact:  Fair  Speech:  Clear and Coherent  Volume:  Normal   Mood:  Euthymic  Affect:  Congruent  Thought Process:  Goal Directed and Descriptions of Associations: Intact  Orientation:  Full (Time, Place, and Person)  Thought Content: Logical   Suicidal Thoughts:  No  Homicidal Thoughts:  No  Memory:  Immediate;   Fair Recent;   Fair Remote;   Fair  Judgement:  Fair  Insight:  Fair  Psychomotor Activity:  Normal  Concentration:  Concentration: Fair and Attention Span: Fair  Recall:  FiservFair  Fund of Knowledge: Fair  Language: Fair  Akathisia:  No  Handed:  Right  AIMS (if indicated): done, 0  Assets:  Communication Skills Desire for Improvement Housing Social Support  ADL's:  Intact  Cognition: WNL  Sleep:   restless   Screenings: GAD-7    Flowsheet Row Video [facility]13[facility]20[facility] Video Visit from 11/12/2020 [facility] Video [facility]/2020 [facility] Office Visit from [facility]/27/2019 [facility] Office Visit from [facility]/21/2019 [facility]  Total GAD-7 Score 0 3 2 10 6       PHQ2-9    Flowsheet Row Video Visit [facility]/28[facility]2021 [facility]20[facility]  PHQ-2 Total Score 0 0 0 0 0  PHQ-9 Total Score -- 0 1 -- --      Flowsheet Row Video Visit [facility]28/2021 [facility] Video Visit from 11/12/2020 [facility] Video Visit from [facility]04/2020 [facility]  C-SSRS RISK CATEGORY No Risk No Risk No Risk        Assessment and Plan: Regina QuaJosephine S Basich is a 66 year old African-American female, divorced, retired, lives in  [place], has a history of anxiety, depression, insomnia, multiple medical problems including paroxysmal atrial fibrillation currently in sinus rhythm was evaluated by telemedicine today.  Patient with sleep problems, otherwise doing well with regards to her mood.  Plan as noted below.  Plan  GAD-stable Venlafaxine extended release 112.5 mg p.o. daily  MDD in remission Venlafaxine extended release 112.5 mg p.o. daily  Insomnia-unstable Start Ambien 2.5-5 mg p.o. nightly as needed 1-2 times a week only.  Patient provided education about long-term adverse side effects including complex sleep-related behaviors.  Patient to limit use.  Reviewed [place] PMP aware  Follow-up in clinic in 3 months or sooner if needed.   Consent: Patient/Guardian gives verbal consent for treatment and assignment of benefits for services provided during this visit. Patient/Guardian expressed understanding and agreed to proceed.   This note was generated in part or whole with voice recognition software. Voice recognition is usually quite accurate but there are transcription errors that can and very often do occur. I apologize for any typographical errors that were not detected and corrected.      Jomarie LongsSaramma Elaysia Devargas, MD 06/28/2021, 8:56 AM

## 2021-07-07 ENCOUNTER — Other Ambulatory Visit (INDEPENDENT_AMBULATORY_CARE_PROVIDER_SITE_OTHER): Payer: Self-pay | Admitting: Family Medicine

## 2021-07-07 DIAGNOSIS — E1169 Type 2 diabetes mellitus with other specified complication: Secondary | ICD-10-CM

## 2021-07-08 ENCOUNTER — Encounter (INDEPENDENT_AMBULATORY_CARE_PROVIDER_SITE_OTHER): Payer: Self-pay | Admitting: Family Medicine

## 2021-07-08 ENCOUNTER — Ambulatory Visit (INDEPENDENT_AMBULATORY_CARE_PROVIDER_SITE_OTHER): Payer: Medicare HMO | Admitting: Family Medicine

## 2021-07-08 ENCOUNTER — Other Ambulatory Visit: Payer: Self-pay

## 2021-07-08 VITALS — BP 136/82 | HR 69 | Temp 97.7°F | Ht 66.0 in | Wt 254.0 lb

## 2021-07-08 DIAGNOSIS — E669 Obesity, unspecified: Secondary | ICD-10-CM

## 2021-07-08 DIAGNOSIS — E1169 Type 2 diabetes mellitus with other specified complication: Secondary | ICD-10-CM

## 2021-07-08 DIAGNOSIS — Z6841 Body Mass Index (BMI) 40.0 and over, adult: Secondary | ICD-10-CM | POA: Diagnosis not present

## 2021-07-08 DIAGNOSIS — H9319 Tinnitus, unspecified ear: Secondary | ICD-10-CM | POA: Diagnosis not present

## 2021-07-08 DIAGNOSIS — R600 Localized edema: Secondary | ICD-10-CM | POA: Diagnosis not present

## 2021-07-08 DIAGNOSIS — I152 Hypertension secondary to endocrine disorders: Secondary | ICD-10-CM | POA: Diagnosis not present

## 2021-07-08 DIAGNOSIS — E1159 Type 2 diabetes mellitus with other circulatory complications: Secondary | ICD-10-CM | POA: Diagnosis not present

## 2021-07-08 DIAGNOSIS — H903 Sensorineural hearing loss, bilateral: Secondary | ICD-10-CM | POA: Diagnosis not present

## 2021-07-08 DIAGNOSIS — Z7985 Long-term (current) use of injectable non-insulin antidiabetic drugs: Secondary | ICD-10-CM

## 2021-07-08 MED ORDER — FUROSEMIDE 20 MG PO TABS: 20.0000 mg | ORAL_TABLET | Freq: Every day | ORAL | 0 refills | Status: DC | PRN

## 2021-07-08 MED ORDER — POTASSIUM CHLORIDE CRYS ER 10 MEQ PO TBCR: 20.0000 meq | EXTENDED_RELEASE_TABLET | Freq: Every day | ORAL | 0 refills | Status: DC

## 2021-07-09 ENCOUNTER — Encounter (INDEPENDENT_AMBULATORY_CARE_PROVIDER_SITE_OTHER): Payer: Self-pay

## 2021-07-16 ENCOUNTER — Telehealth: Payer: Self-pay

## 2021-07-16 ENCOUNTER — Telehealth (INDEPENDENT_AMBULATORY_CARE_PROVIDER_SITE_OTHER): Payer: Medicare HMO | Admitting: Internal Medicine

## 2021-07-16 DIAGNOSIS — R051 Acute cough: Secondary | ICD-10-CM

## 2021-07-16 DIAGNOSIS — J069 Acute upper respiratory infection, unspecified: Secondary | ICD-10-CM

## 2021-07-16 MED ORDER — DEXTROMETHORPHAN POLISTIREX ER 30 MG/5ML PO SUER: 30.0000 mg | Freq: Two times a day (BID) | ORAL | 0 refills | Status: DC | PRN

## 2021-07-16 MED ORDER — FLOVENT DISKUS 50 MCG/ACT IN AEPB: 1.0000 | INHALATION_SPRAY | Freq: Every day | RESPIRATORY_TRACT | 1 refills | Status: DC

## 2021-07-16 NOTE — Progress Notes (Signed)
Chief Complaint:   OBESITY Rennie is here to discuss her progress with her obesity treatment plan along with follow-up of her obesity related diagnoses. See Medical Weight Management Flowsheet for complete bioelectrical impedance results.  Today's visit was #: 13 Starting weight: 255 lbs Starting date: 05/10/2020 Weight change since last visit: +4 lbs Total lbs lost to date: 1 lb Total weight loss percentage to date: -0.39%  Nutrition Plan: Category 1 Plan for 90% of the time.  Activity: None. Anti-obesity medications: Mounjaro 5 mg subcutaneously weekly. Reported side effects: None.  Interim History: Makensie is frustrated with her weight gain.  She thought she would have lost.  She endorses lower extremity edema - pitting to mid shin.  Assessment/Plan:   1. Bilateral lower extremity edema Devoiry will start Lasix 20 mg daily as needed along with potasium 20 mEq daily.  She will take the potassium along with the Lasix.  - Start furosemide (LASIX) 20 MG tablet; Take 1 tablet (20 mg total) by mouth daily as needed.  Dispense: 30 tablet; Refill: 0 - Start potassium chloride (KLOR-CON M) 10 MEQ tablet; Take 2 tablets (20 mEq total) by mouth daily. Take with Lasix  Dispense: 60 tablet; Refill: 0  2. Type 2 diabetes mellitus with other specified complication, without long-term current use of insulin (HCC) Diabetes Mellitus: At goal. Medication: Mounjaro 5 mg subcutaneously weekly. Issues reviewed: blood sugar goals, complications of diabetes mellitus, hypoglycemia prevention and treatment, exercise, and nutrition.  Plan: The patient will continue to focus on protein-rich, low simple carbohydrate foods. We reviewed the importance of hydration, regular exercise for stress reduction, and restorative sleep.   Lab Results  Component Value Date   HGBA1C 5.6 03/13/2021   HGBA1C 6.0 (H) 08/16/2020   HGBA1C 6.2 (H) 03/01/2020   Lab Results  Component Value Date   LDLCALC 46  03/13/2021   CREATININE 0.74 03/13/2021   3. Hypertension associated with type 2 diabetes mellitus (HCC) At goal. Medications: Norvasc 10 mg daily, metoprolol 100 mg daily, Benicar 40 mg daily.   Plan: Avoid buying foods that are: processed, frozen, or prepackaged to avoid excess salt. We will watch for signs of hypotension as she continues lifestyle modifications.  BP Readings from Last 3 Encounters:  07/08/21 136/82  06/03/21 128/74  05/13/21 134/84   Lab Results  Component Value Date   CREATININE 0.74 03/13/2021   4. Obesity, current BMI 41.1  Course: Domenica is currently in the action stage of change. As such, her goal is to continue with weight loss efforts.   Nutrition goals: She has agreed to the Category 1 Plan.   Exercise goals:  Increase NEAT.  Behavioral modification strategies: increasing lean protein intake, decreasing simple carbohydrates, and increasing vegetables.  Bethannie has agreed to follow-up with our clinic in 3 weeks. She was informed of the importance of frequent follow-up visits to maximize her success with intensive lifestyle modifications for her multiple health conditions.   Objective:   Blood pressure 136/82, pulse 69, temperature 97.7 F (36.5 C), temperature source Oral, height 5\' 6"  (1.676 m), weight 254 lb (115.2 kg), SpO2 97 %. Body mass index is 41 kg/m.  General: Cooperative, alert, well developed, in no acute distress. HEENT: Conjunctivae and lids unremarkable. Cardiovascular: Regular rhythm.  Lungs: Normal work of breathing. Neurologic: No focal deficits.   Lab Results  Component Value Date   CREATININE 0.74 03/13/2021   BUN 12 03/13/2021   NA 142 03/13/2021   K 3.8 03/13/2021  CL 106 03/13/2021   CO2 26 03/13/2021   Lab Results  Component Value Date   ALT 11 03/13/2021   AST 13 03/13/2021   ALKPHOS 111 11/14/2020   BILITOT 0.4 03/13/2021   Lab Results  Component Value Date   HGBA1C 5.6 03/13/2021   HGBA1C 6.0 (H)  08/16/2020   HGBA1C 6.2 (H) 03/01/2020   HGBA1C 6.3 (H) 11/29/2019   HGBA1C 6.3 (H) 02/03/2019   Lab Results  Component Value Date   TSH 1.250 11/14/2020   Lab Results  Component Value Date   CHOL 130 03/13/2021   HDL 68 03/13/2021   LDLCALC 46 03/13/2021   TRIG 77 03/13/2021   CHOLHDL 1.9 03/13/2021   Lab Results  Component Value Date   VD25OH 37.3 11/14/2020   Lab Results  Component Value Date   WBC 7.9 03/13/2021   HGB 12.4 03/13/2021   HCT 38.8 03/13/2021   MCV 87.2 03/13/2021   PLT 382 03/13/2021   Lab Results  Component Value Date   IRON 39 11/14/2020   TIBC 329 11/14/2020   FERRITIN 25 11/14/2020   Attestation Statements:   Reviewed by clinician on day of visit: allergies, medications, problem list, medical history, surgical history, family history, social history, and previous encounter notes.  I, Insurance claims handler, CMA, am acting as transcriptionist for Helane Rima, DO  I have reviewed the above documentation for accuracy and completeness, and I agree with the above. -  Helane Rima, DO, MS, FAAFP, DABOM - Family and Bariatric Medicine.

## 2021-07-16 NOTE — Telephone Encounter (Signed)
Flovent is to expensive can you call in something different

## 2021-07-16 NOTE — Progress Notes (Signed)
Virtual Visit via Video Note ? ?I connected with Regina Christensen on 07/16/21 at  8:00 AM EDT by a video enabled telemedicine application and verified that I am speaking with the correct person using two identifiers. ? ?Location: ?Patient: Home  ?Provider: [facility] ?  ?I discussed the limitations of evaluation and management by telemedicine and the availability of in person appointments. The patient expressed understanding and agreed to proceed. ? ?History of Present Illness: ? ?Regina Christensen is a 66 year old female presenting via telemedicine for cough/congestion. Symptoms started 5 days ago, gradually getting worse.  ? ?URI Compliant:  ?-Worst symptom: cough, wheeze ?-Fever: no ?-Cough: yes, dry ?-Shortness of breath: no ?-Wheezing: yes ?-Chest pain: no ?-Chest tightness: no ?-Chest congestion: yes ?-Nasal congestion: yes ?-Runny nose: yes, clear  ?-Post nasal drip: no ?-Sneezing: no ?-Sore throat: no ?-Sinus pressure: no ?-Headache: yes ?-Face pain: no ?-Ear pain: no  ?-Ear pressure:  no ?-Eyes red/itching:yes, right eye was watery but resolved  ?-Vomiting: no ?-Sick contacts: no, daughter sick last week with cough   ?-Context: worse ?-Relief with OTC cold/cough medications: no  ?-Treatments attempted: mucinex and pseudoephedrine,Tessalon perles ? ?Observations/Objective: ? ?General: well appearing, no acute distress ?ENT: conjunctiva normal appearing bilaterally  ?Neuro: answers all questions appropriately  ? ?Assessment and Plan: ? ?1. Upper respiratory tract infection, unspecified type/Acute cough: Symptoms viral vs. Allergic. Treat with steroid inhaler, cough suppressant, nasal steroid and Mucinex DM. Follow up if symptoms worsen or fail to improve.  ? ?- Fluticasone Propionate, Inhal, (FLOVENT DISKUS) 50 MCG/ACT AEPB; Inhale 1 puff into the lungs daily.  Dispense: 1 each; Refill: 1 ?- dextromethorphan (ROBITUSSIN 12 HOUR COUGH) 30 MG/5ML liquid; Take 5 mLs (30 mg total) by mouth every 12 (twelve) hours as  needed for cough.  Dispense: 89 mL; Refill: 0 ? ?Follow Up Instructions: PRN ? ?  ?I discussed the assessment and treatment plan with the patient. The patient was provided an opportunity to ask questions and all were answered. The patient agreed with the plan and demonstrated an understanding of the instructions. ?  ?The patient was advised to call back or seek an in-person evaluation if the symptoms worsen or if the condition fails to improve as anticipated. ? ?I provided 10 minutes of non-face-to-face time during this encounter. ? ? ?Margarita Mail, DO ? ?

## 2021-07-16 NOTE — Patient Instructions (Addendum)
It was great seeing you today! ? ?Plan discussed at today's visit: ?-Use steroid inhaler but rinse mouth out afterwards ?-Continue to use Nasocort for congestion ?-Can use OTC cough suppressant at night and Mucinex DM in the morning  ? ?Follow up in: as needed ? ?Take care and let us know if you have any questions or concerns prior to your next visit. ? ?Dr. Rosana Berger ? ?

## 2021-07-18 MED ORDER — ASMANEX (60 METERED DOSES) 220 MCG/ACT IN AEPB: 2.0000 | INHALATION_SPRAY | Freq: Every day | RESPIRATORY_TRACT | 1 refills | Status: DC

## 2021-07-18 NOTE — Addendum Note (Signed)
Addended by: Teodora Medici on: 07/18/2021 07:48 AM ? ? Modules accepted: Orders ? ?

## 2021-07-29 DIAGNOSIS — L03213 Periorbital cellulitis: Secondary | ICD-10-CM | POA: Diagnosis not present

## 2021-07-29 DIAGNOSIS — H00024 Hordeolum internum left upper eyelid: Secondary | ICD-10-CM | POA: Diagnosis not present

## 2021-07-31 ENCOUNTER — Other Ambulatory Visit (INDEPENDENT_AMBULATORY_CARE_PROVIDER_SITE_OTHER): Payer: Self-pay | Admitting: Family Medicine

## 2021-07-31 DIAGNOSIS — R600 Localized edema: Secondary | ICD-10-CM

## 2021-08-05 ENCOUNTER — Other Ambulatory Visit: Payer: Self-pay | Admitting: Internal Medicine

## 2021-08-05 DIAGNOSIS — R051 Acute cough: Secondary | ICD-10-CM

## 2021-08-05 DIAGNOSIS — J069 Acute upper respiratory infection, unspecified: Secondary | ICD-10-CM

## 2021-08-06 ENCOUNTER — Ambulatory Visit (INDEPENDENT_AMBULATORY_CARE_PROVIDER_SITE_OTHER): Payer: BC Managed Care – PPO | Admitting: Adult Health

## 2021-08-06 ENCOUNTER — Encounter (INDEPENDENT_AMBULATORY_CARE_PROVIDER_SITE_OTHER): Payer: Self-pay | Admitting: Physician Assistant

## 2021-08-06 ENCOUNTER — Ambulatory Visit (INDEPENDENT_AMBULATORY_CARE_PROVIDER_SITE_OTHER): Payer: Medicare HMO | Admitting: Physician Assistant

## 2021-08-06 VITALS — BP 130/79 | HR 67 | Temp 97.9°F | Ht 66.0 in | Wt 250.0 lb

## 2021-08-06 DIAGNOSIS — E669 Obesity, unspecified: Secondary | ICD-10-CM | POA: Diagnosis not present

## 2021-08-06 DIAGNOSIS — Z7985 Long-term (current) use of injectable non-insulin antidiabetic drugs: Secondary | ICD-10-CM

## 2021-08-06 DIAGNOSIS — E559 Vitamin D deficiency, unspecified: Secondary | ICD-10-CM

## 2021-08-06 DIAGNOSIS — E1169 Type 2 diabetes mellitus with other specified complication: Secondary | ICD-10-CM

## 2021-08-06 DIAGNOSIS — R600 Localized edema: Secondary | ICD-10-CM

## 2021-08-06 DIAGNOSIS — Z6841 Body Mass Index (BMI) 40.0 and over, adult: Secondary | ICD-10-CM | POA: Diagnosis not present

## 2021-08-06 MED ORDER — VITAMIN D (ERGOCALCIFEROL) 1.25 MG (50000 UNIT) PO CAPS: 50000.0000 [IU] | ORAL_CAPSULE | ORAL | 1 refills | Status: DC

## 2021-08-06 MED ORDER — OZEMPIC (2 MG/DOSE) 8 MG/3ML ~~LOC~~ SOPN: 2.0000 mg | PEN_INJECTOR | SUBCUTANEOUS | 0 refills | Status: DC

## 2021-08-06 MED ORDER — FUROSEMIDE 20 MG PO TABS: 20.0000 mg | ORAL_TABLET | Freq: Every day | ORAL | 0 refills | Status: DC | PRN

## 2021-08-06 MED ORDER — POTASSIUM CHLORIDE CRYS ER 10 MEQ PO TBCR: 20.0000 meq | EXTENDED_RELEASE_TABLET | Freq: Every day | ORAL | 0 refills | Status: DC

## 2021-08-06 NOTE — Telephone Encounter (Signed)
Requested medications are due for refill today.  yes ? ?Requested medications are on the active medications list.  yes ? ?Last refill. 07/18/2021 1 with 1 refill ? ?Future visit scheduled.   yes ? ?Notes to clinic.  Medication refill is not delegated. ? ? ? ?Requested Prescriptions  ?Pending Prescriptions Disp Refills  ? ASMANEX, 60 METERED DOSES, 220 MCG/ACT inhaler [Pharmacy Med Name: ASMANEX TWISTHALER 220 MCG #60] 3 each 1  ?  Sig: INHALE 2 PUFFS BY MOUTH INTO THE LUNGS DAILY  ?  ? Not Delegated - Pulmonology:  Corticosteroids 2 Failed - 08/05/2021  3:40 PM  ?  ?  Failed - This refill cannot be delegated  ?  ?  Passed - Valid encounter within last 12 months  ?  Recent Outpatient Visits   ? ?      ? 3 weeks ago Upper respiratory tract infection, unspecified type  ? [place], DO  ? 4 months ago COVID-19 virus infection  ? [place], PA-C  ? 4 months ago Major depression in remission [facility])  ? [facility] Devol, Drue Stager, MD  ? 7 months ago Discoloration of skin  ? [place], DO  ? 11 months ago OSA (obstructive sleep apnea)  ? [facility] Steele Sizer, MD  ? ?  ?  ?Future Appointments   ? ?        ? In 1 month Steele Sizer, MD [facility] [place]  ? ?  ? ? ?  ?  ?  ?  ?

## 2021-08-08 ENCOUNTER — Other Ambulatory Visit: Payer: Self-pay | Admitting: *Deleted

## 2021-08-08 DIAGNOSIS — I4891 Unspecified atrial fibrillation: Secondary | ICD-10-CM

## 2021-08-08 MED ORDER — APIXABAN 5 MG PO TABS: 5.0000 mg | ORAL_TABLET | Freq: Two times a day (BID) | ORAL | 0 refills | Status: DC

## 2021-08-08 NOTE — Telephone Encounter (Signed)
Eliquis 5mg  paper refill request received. Patient is 66 years old, weight-113.4kg, Crea-0.74 on 03/13/2021, Diagnosis-Afib, and last seen by Dr. 03/15/2021 on 11/01/2020 and was due back in 6 months. Dose is appropriate based on dosing criteria. Will send in a limited refill to requested pharmacy.   ?

## 2021-08-16 NOTE — Progress Notes (Signed)
? ? ? ?Chief Complaint:  ? ?OBESITY ?Regina Christensen is here to discuss her progress with her obesity treatment plan along with follow-up of her obesity related diagnoses. Orvilla is on the Category 1 Plan and states she is following her eating plan approximately 85% of the time. Lissett states she is doing 0 minutes 0 times per week. ? ?Today's visit was #: 14 ?Starting weight: 255 lbs ?Starting date: 05/10/2020 ?Today's weight: 250 lbs ?Today's date: 08/06/2021 ?Total lbs lost to date: 5 lbs ?Total lbs lost since last in-office visit: 4 lbs ? ?Interim History: Ailen reports that she hasn't been drinking enough water. She tried the [employer] and didn't like it but states that she will continue to eat it. She reports that she is having a hard time eating because she doesn't feel like eating.  ? ?Subjective:  ? ?1. Type 2 diabetes mellitus with other specified complication, without long-term current use of insulin (HCC) ?Sukaina's last A1C was 5.6. She is currently on Ozempic 5 mg. Her appetite is controlled.  ? ?2. Bilateral lower extremity edema ?Atalya is on lasix and potassium currently. Her bilateral lower extremity edema has decreased. She denies shortness of breath. She sees cardiology. She has a follow up with her primary care physician next month.  ? ?3. Vitamin D deficiency ?Charline is currently on Vitamin D weekly. She is tolerating it well.  ? ?Assessment/Plan:  ? ?1. Type 2 diabetes mellitus with other specified complication, without long-term current use of insulin (HCC) ?We will refill Ozempic 2 mg for 1 month with no refills. Good blood sugar control is important to decrease the likelihood of diabetic complications such as nephropathy, neuropathy, limb loss, blindness, coronary artery disease, and death. Intensive lifestyle modification including diet, exercise and weight loss are the first line of treatment for diabetes.  ? ?- Semaglutide, 2 MG/DOSE, (OZEMPIC, 2 MG/DOSE,) 8 MG/3ML SOPN;  Inject 2 mg into the skin once a week.  Dispense: 3 mL; Refill: 0 ? ?2. Bilateral lower extremity edema ?We will refill klor-con m 10 MEQ for 1 month with no refills. We will refill furosemide 20 mg for 1 month with no refills  ? ?- furosemide (LASIX) 20 MG tablet; Take 1 tablet (20 mg total) by mouth daily as needed.  Dispense: 30 tablet; Refill: 0 ?- potassium chloride (KLOR-CON M) 10 MEQ tablet; Take 2 tablets (20 mEq total) by mouth daily. Take with Lasix  Dispense: 60 tablet; Refill: 0 ? ?3. Vitamin D deficiency ?Low Vitamin D level contributes to fatigue and are associated with obesity, breast, and colon cancer. We will refill prescription Vitamin D 50,000 IU every week for 1 month with no refills and Dawanna will follow-up for routine testing of Vitamin D, at least 2-3 times per year to avoid over-replacement. ? ?- Vitamin D, Ergocalciferol, (DRISDOL) 1.25 MG (50000 UNIT) CAPS capsule; Take 1 capsule (50,000 Units total) by mouth every 7 (seven) days.  Dispense: 12 capsule; Refill: 1 ? ?4. Obesity, current BMI 40.37 ?Kiesha is currently in the action stage of change. As such, her goal is to continue with weight loss efforts. She has agreed to the Category 1 Plan.  ? ?We will check labs at next office visit CMP, lipids, Vitamin D, A1C, and insulin.  ? ?Exercise goals: No exercise has been prescribed at this time. ? ?Behavioral modification strategies: increasing lean protein intake and increasing water intake. ? ?Samina has agreed to follow-up with our clinic in 4 weeks. She was informed of the  importance of frequent follow-up visits to maximize her success with intensive lifestyle modifications for her multiple health conditions.  ? ?Objective:  ? ?Blood pressure 130/79, pulse 67, temperature 97.9 ?F (36.6 ?C), height 5\' 6"  (1.676 m), weight 250 lb (113.4 kg), SpO2 97 %. ?Body mass index is 40.35 kg/m?. ? ?General: Cooperative, alert, well developed, in no acute distress. ?HEENT: Conjunctivae and lids  unremarkable. ?Cardiovascular: Regular rhythm.  ?Lungs: Normal work of breathing. ?Neurologic: No focal deficits.  ? ?Lab Results  ?Component Value Date  ? CREATININE 0.74 03/13/2021  ? BUN 12 03/13/2021  ? NA 142 03/13/2021  ? K 3.8 03/13/2021  ? CL 106 03/13/2021  ? CO2 26 03/13/2021  ? ?Lab Results  ?Component Value Date  ? ALT 11 03/13/2021  ? AST 13 03/13/2021  ? ALKPHOS 111 11/14/2020  ? BILITOT 0.4 03/13/2021  ? ?Lab Results  ?Component Value Date  ? HGBA1C 5.6 03/13/2021  ? HGBA1C 6.0 (H) 08/16/2020  ? HGBA1C 6.2 (H) 03/01/2020  ? HGBA1C 6.3 (H) 11/29/2019  ? HGBA1C 6.3 (H) 02/03/2019  ? ?No results found for: INSULIN ?Lab Results  ?Component Value Date  ? TSH 1.250 11/14/2020  ? ?Lab Results  ?Component Value Date  ? CHOL 130 03/13/2021  ? HDL 68 03/13/2021  ? LDLCALC 46 03/13/2021  ? TRIG 77 03/13/2021  ? CHOLHDL 1.9 03/13/2021  ? ?Lab Results  ?Component Value Date  ? VD25OH 37.3 11/14/2020  ? ?Lab Results  ?Component Value Date  ? WBC 7.9 03/13/2021  ? HGB 12.4 03/13/2021  ? HCT 38.8 03/13/2021  ? MCV 87.2 03/13/2021  ? PLT 382 03/13/2021  ? ?Lab Results  ?Component Value Date  ? IRON 39 11/14/2020  ? TIBC 329 11/14/2020  ? FERRITIN 25 11/14/2020  ? ? ?Obesity Behavioral Intervention:  ? ?Approximately 15 minutes were spent on the discussion below. ? ?ASK: ?We discussed the diagnosis of obesity with 11/16/2020 today and Arelis agreed to give [name] permission to discuss obesity behavioral modification therapy today. ? ?ASSESS: ?Kaedence has the diagnosis of obesity and her BMI today is 40.4. Kaja is in the action stage of change.  ? ?ADVISE: ?Duanna was educated on the multiple health risks of obesity as well as the benefit of weight loss to improve her health. She was advised of the need for long term treatment and the importance of lifestyle modifications to improve her current health and to decrease her risk of future health problems. ? ?AGREE: ?Multiple dietary modification options and  treatment options were discussed and Channon agreed to follow the recommendations documented in the above note. ? ?ARRANGE: ?Topacio was educated on the importance of frequent visits to treat obesity as outlined per CMS and USPSTF guidelines and agreed to schedule her next follow up appointment today. ? ?Attestation Statements:  ? ?Reviewed by clinician on day of visit: allergies, medications, problem list, medical history, surgical history, family history, social history, and previous encounter notes. ? ?I, [name], am acting as Sindy Messing for [employer], PA-C. ? ?I have reviewed the above documentation for accuracy and completeness, and I agree with the above. [employer], PA-C ? ?

## 2021-08-21 ENCOUNTER — Other Ambulatory Visit: Payer: Self-pay | Admitting: Medical

## 2021-08-27 ENCOUNTER — Ambulatory Visit (INDEPENDENT_AMBULATORY_CARE_PROVIDER_SITE_OTHER): Payer: Medicare HMO | Admitting: Adult Health

## 2021-08-28 ENCOUNTER — Ambulatory Visit (INDEPENDENT_AMBULATORY_CARE_PROVIDER_SITE_OTHER): Payer: Medicare HMO | Admitting: Adult Health

## 2021-08-28 ENCOUNTER — Encounter (INDEPENDENT_AMBULATORY_CARE_PROVIDER_SITE_OTHER): Payer: Self-pay | Admitting: Adult Health

## 2021-08-28 VITALS — BP 121/78 | HR 73 | Temp 98.0°F | Ht 66.0 in | Wt 251.0 lb

## 2021-08-28 DIAGNOSIS — E1159 Type 2 diabetes mellitus with other circulatory complications: Secondary | ICD-10-CM

## 2021-08-28 DIAGNOSIS — E559 Vitamin D deficiency, unspecified: Secondary | ICD-10-CM

## 2021-08-28 DIAGNOSIS — I152 Hypertension secondary to endocrine disorders: Secondary | ICD-10-CM

## 2021-08-28 DIAGNOSIS — Z7985 Long-term (current) use of injectable non-insulin antidiabetic drugs: Secondary | ICD-10-CM

## 2021-08-28 DIAGNOSIS — E1169 Type 2 diabetes mellitus with other specified complication: Secondary | ICD-10-CM | POA: Diagnosis not present

## 2021-08-28 DIAGNOSIS — Z6841 Body Mass Index (BMI) 40.0 and over, adult: Secondary | ICD-10-CM

## 2021-08-28 DIAGNOSIS — E669 Obesity, unspecified: Secondary | ICD-10-CM | POA: Diagnosis not present

## 2021-08-28 MED ORDER — OZEMPIC (2 MG/DOSE) 8 MG/3ML ~~LOC~~ SOPN: 2.0000 mg | PEN_INJECTOR | SUBCUTANEOUS | 0 refills | Status: DC

## 2021-08-29 ENCOUNTER — Encounter (INDEPENDENT_AMBULATORY_CARE_PROVIDER_SITE_OTHER): Payer: Self-pay | Admitting: Adult Health

## 2021-08-29 ENCOUNTER — Other Ambulatory Visit (INDEPENDENT_AMBULATORY_CARE_PROVIDER_SITE_OTHER): Payer: Self-pay | Admitting: Adult Health

## 2021-08-29 DIAGNOSIS — E559 Vitamin D deficiency, unspecified: Secondary | ICD-10-CM

## 2021-08-29 LAB — COMPREHENSIVE METABOLIC PANEL
ALT: 12 IU/L (ref 0–32)
AST: 15 IU/L (ref 0–40)
Albumin/Globulin Ratio: 1.4 (ref 1.2–2.2)
Albumin: 4 g/dL (ref 3.8–4.8)
Alkaline Phosphatase: 100 IU/L (ref 44–121)
BUN/Creatinine Ratio: 14 (ref 12–28)
BUN: 10 mg/dL (ref 8–27)
Bilirubin Total: 0.3 mg/dL (ref 0.0–1.2)
CO2: 26 mmol/L (ref 20–29)
Calcium: 9.3 mg/dL (ref 8.7–10.3)
Chloride: 106 mmol/L (ref 96–106)
Creatinine, Ser: 0.7 mg/dL (ref 0.57–1.00)
Globulin, Total: 2.9 g/dL (ref 1.5–4.5)
Glucose: 88 mg/dL (ref 70–99)
Potassium: 3.6 mmol/L (ref 3.5–5.2)
Sodium: 145 mmol/L — ABNORMAL HIGH (ref 134–144)
Total Protein: 6.9 g/dL (ref 6.0–8.5)
eGFR: 96 mL/min/{1.73_m2} (ref >59)

## 2021-08-29 LAB — HEMOGLOBIN A1C
Est. average glucose Bld gHb Est-mCnc: 120 mg/dL
Hgb A1c MFr Bld: 5.8 % — ABNORMAL HIGH (ref 4.8–5.6)

## 2021-08-29 LAB — VITAMIN D 25 HYDROXY (VIT D DEFICIENCY, FRACTURES): Vit D, 25-Hydroxy: 38.1 ng/mL (ref 30.0–100.0)

## 2021-08-29 LAB — INSULIN, RANDOM: INSULIN: 8 u[IU]/mL (ref 2.6–24.9)

## 2021-08-29 MED ORDER — VITAMIN D (ERGOCALCIFEROL) 1.25 MG (50000 UNIT) PO CAPS: 50000.0000 [IU] | ORAL_CAPSULE | ORAL | 1 refills | Status: DC

## 2021-08-31 ENCOUNTER — Other Ambulatory Visit (INDEPENDENT_AMBULATORY_CARE_PROVIDER_SITE_OTHER): Payer: Self-pay | Admitting: Physician Assistant

## 2021-08-31 DIAGNOSIS — R600 Localized edema: Secondary | ICD-10-CM

## 2021-09-02 ENCOUNTER — Encounter (INDEPENDENT_AMBULATORY_CARE_PROVIDER_SITE_OTHER): Payer: Self-pay | Admitting: Adult Health

## 2021-09-02 NOTE — Progress Notes (Signed)
? ? ? ?Chief Complaint:  ? ?OBESITY ?Regina Christensen is here to discuss her progress with her obesity treatment plan along with follow-up of her obesity related diagnoses. Regina Christensen is on the Category 1 Plan and states she is following her eating plan approximately 80% of the time. Regina Christensen states she is exercising 0 minutes 0 times per week. ? ?Today's visit was #: 15 ?Starting weight: 255 lbs ?Starting date: 05/10/2020 ?Today's weight: 251 lbs ?Today's date: 08/28/2021 ?Total lbs lost to date: 4 ?Total lbs lost since last in-office visit: 0 ? ?Interim History:  ?Regina Christensen is fasting at office visit today, she is agreeable to update labs today. ? ?Subjective:  ? ?1. Type 2 diabetes mellitus with other specified complication, without long-term current use of insulin (HCC) ?Regina Christensen is currently on Ozempic 2 mg once a week. Only side effect is constipation.  ? ?2. Vitamin D deficiency ?Regina Christensen is currently taking ergocalciferol.  ?Denies any nausea, vomiting or muscle weakness.  ? ?3. Hypertension associated with type 2 diabetes mellitus (HCC) ?Regina Christensen's blood pressure and heart rate are excellent at this office visit. She is currently Amlodipine 10 mg daily, Metoprolol Tartrate 100 mg twice a day, Olmesrtan 40mg , Furosemide 20 mg as needed with K+ replacement 10 mEQ- 2 tabs when using Furosemide. She estimate to use diuretic 3 times a week.  ? ?Assessment/Plan:  ? ?1. Type 2 diabetes mellitus with other specified complication, without long-term current use of insulin (HCC) ?We will refill Ozempic 2 mg once a week for 1 month with no refill. She will take over the counter Mirlax. We will check labs today. ?-Refill Semaglutide, 2 MG/DOSE, (OZEMPIC, 2 MG/DOSE,) 8 MG/3ML SOPN; Inject 2 mg into the skin once a week.  Dispense: 3 mL; Refill: 0 ? ?- Hemoglobin A1c ?- Insulin, random ? ?2. Vitamin D deficiency ? We will check labs today during visit.  ?Mychart message after lab results and refill ergocalciferol as  appropriate.  ? ?- VITAMIN D 25 Hydroxy (Vit-D Deficiency, Fractures) ? ?3. Hypertension associated with type 2 diabetes mellitus (HCC) ?We will check labs today during visit.   ? ?- Comprehensive metabolic panel ? ?4. Obesity, current BMI 40.5 ?Regina Christensen is currently in the action stage of change. As such, her goal is to continue with weight loss efforts. She has agreed to the Category 1 Plan.  ? ?Exercise goals: All adults should avoid inactivity. Some physical activity is better than none, and adults who participate in any amount of physical activity gain some health benefits. Also walk around house once a day. ? ?Behavioral modification strategies: increasing lean protein intake, decreasing simple carbohydrates, meal planning and cooking strategies, keeping healthy foods in the home, better snacking choices, planning for success, and decreasing junk food. ? ?Regina Christensen has agreed to follow-up with our clinic in 4 weeks. She was informed of the importance of frequent follow-up visits to maximize her success with intensive lifestyle modifications for her multiple health conditions.  ? ?Regina Christensen was informed we would discuss her lab results at her next visit unless there is a critical issue that needs to be addressed sooner. Regina Christensen agreed to keep her next visit at the agreed upon time to discuss these results. ? ?Objective:  ? ?Blood pressure 121/78, pulse 73, temperature 98 ?F (36.7 ?C), height 5\' 6"  (1.676 m), weight 251 lb (113.9 kg), SpO2 97 %. ?Body mass index is 40.51 kg/m?. ? ?General: Cooperative, alert, well developed, in no acute distress. ?HEENT: Conjunctivae and lids unremarkable. ?Cardiovascular: Regular rhythm.  ?  Lungs: Normal work of breathing. ?Neurologic: No focal deficits.  ? ?Lab Results  ?Component Value Date  ? CREATININE 0.70 08/28/2021  ? BUN 10 08/28/2021  ? NA 145 (H) 08/28/2021  ? K 3.6 08/28/2021  ? CL 106 08/28/2021  ? CO2 26 08/28/2021  ? ?Lab Results  ?Component Value Date  ? ALT 12  08/28/2021  ? AST 15 08/28/2021  ? ALKPHOS 100 08/28/2021  ? BILITOT 0.3 08/28/2021  ? ?Lab Results  ?Component Value Date  ? HGBA1C 5.8 (H) 08/28/2021  ? HGBA1C 5.6 03/13/2021  ? HGBA1C 6.0 (H) 08/16/2020  ? HGBA1C 6.2 (H) 03/01/2020  ? HGBA1C 6.3 (H) 11/29/2019  ? ?Lab Results  ?Component Value Date  ? INSULIN 8.0 08/28/2021  ? ?Lab Results  ?Component Value Date  ? TSH 1.250 11/14/2020  ? ?Lab Results  ?Component Value Date  ? CHOL 130 03/13/2021  ? HDL 68 03/13/2021  ? LDLCALC 46 03/13/2021  ? TRIG 77 03/13/2021  ? CHOLHDL 1.9 03/13/2021  ? ?Lab Results  ?Component Value Date  ? VD25OH 38.1 08/28/2021  ? VD25OH 37.3 11/14/2020  ? ?Lab Results  ?Component Value Date  ? WBC 7.9 03/13/2021  ? HGB 12.4 03/13/2021  ? HCT 38.8 03/13/2021  ? MCV 87.2 03/13/2021  ? PLT 382 03/13/2021  ? ?Lab Results  ?Component Value Date  ? IRON 39 11/14/2020  ? TIBC 329 11/14/2020  ? FERRITIN 25 11/14/2020  ? ? ?Obesity Behavioral Intervention:  ? ?Approximately 15 minutes were spent on the discussion below. ? ?ASK: ?We discussed the diagnosis of obesity with Regina Christensen today and Regina Christensen agreed to give [place] permission to discuss obesity behavioral modification therapy today. ? ?ASSESS: ?Regina Christensen has the diagnosis of obesity and her BMI today is 40.5. Regina Christensen is in the action stage of change.  ? ?ADVISE: ?Regina Christensen was educated on the multiple health risks of obesity as well as the benefit of weight loss to improve her health. She was advised of the need for long term treatment and the importance of lifestyle modifications to improve her current health and to decrease her risk of future health problems. ? ?AGREE: ?Multiple dietary modification options and treatment options were discussed and Regina Christensen agreed to follow the recommendations documented in the above note. ? ?ARRANGE: ?Regina Christensen was educated on the importance of frequent visits to treat obesity as outlined per CMS and USPSTF guidelines and agreed to schedule her next  follow up appointment today. ? ?Attestation Statements:  ? ?Reviewed by clinician on day of visit: allergies, medications, problem list, medical history, surgical history, family history, social history, and previous encounter notes. ? ?I, Regina Christensen, RMA, am acting as transcriptionist for William Hamburger, NP. ? ?I have reviewed the above documentation for accuracy and completeness, and I agree with the above. -  Regina Dicker d. Nakeyia Menden, NP-C ?

## 2021-09-02 NOTE — Telephone Encounter (Signed)
Please advise 

## 2021-09-02 NOTE — Telephone Encounter (Signed)
Last OV with Katy 

## 2021-09-06 ENCOUNTER — Other Ambulatory Visit: Payer: Self-pay | Admitting: Family Medicine

## 2021-09-06 DIAGNOSIS — I4819 Other persistent atrial fibrillation: Secondary | ICD-10-CM

## 2021-09-09 NOTE — Progress Notes (Unsigned)
Name: Regina Christensen   MRN: [id number]    DOB: [birth year]   Date:09/10/2021       Progress Note  Subjective  Chief Complaint  Follow Up  HPI  History of CVA without sequela: she went to have a MRI on brain on 02/28/2020 for evaluation of vertigo and CVA was found on MRI She is taking Atorvastatin without side effects  IMPRESSION: 2021 1.  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  2.  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  3.  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    MR Angio neck and head with and without contrast was normal   Echo was negative for thrombus, mild diastolic dysfunction -managed by cardiologist    Vertigo: she was diagnosed with BPPV previously had Epley maneuver,, she had vestibular rehab and no episodes lately, she still has meclizine at home   Afib:   she was on  Eliquis but since CVA  02/28/2020, she is compliant on Lopressor and Eliquis and is doing well, no palpitation or SOB   MDD/GAD: she  is under the care of Dr. Shea Evans . 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 She is compliant with medication and is taking Effexor as prescribed. Phq 9 is negative   Insomnia: she states Dr. Shea Evans , she was given rx of Rozerem but states it caused nightmares, she was given Ambien but she states does not take it due to nightmares and worries about side effects .    HTN: she is only on lopressor, amlodipine and Benicar. No chest pain or palpitation . No SOB, she has intermittent lower extremity edema and [facility] gave her some lasix to take prn    OSA: she states only wears CPAP machine occasionally   Morbid obesity: she  [facility]. She has a history of sleeve  bariatric surgery about 10 years ago, her weight prior to surgery was 300 lbs and  she lost 50 lbs and has been stable. She is taking Ozempic  given by [facility] and has not lost much weight but she has been more mindful of her diet    History of bilateral knee replacement: right in 2021 and left Oct 2022, she is only using a cane now when walking long distance, pain is mild at most 1/10 and not daily    DMII: with hypertension and dyslipidemia, diagnosed by the [facility]. She is still taking Ozempic , no side effects. She  denies polyphagia, polyuria or polydipsia. Discussed yearly eye exam and foot exam     Patient Active Problem List   Diagnosis Date Noted   Chronic diastolic heart failure ([place]) 09/10/2021   History of total bilateral knee replacement 09/10/2021   History of blood transfusion 09/10/2020   Current use of long term anticoagulation    GERD without esophagitis    Vitamin D deficiency 04/12/2020   History of CVA (cerebrovascular accident) without residual deficits 03/05/2020   History of hysterectomy 07/19/2019   Anxiety 07/19/2019   MDD (major depressive disorder), recurrent, in full remission ([place]) 07/19/2019   Insomnia due to mental condition 07/19/2019   Bilateral carpal tunnel syndrome 07/30/2016   OSA (obstructive sleep apnea) 03/07/2016   BPPV (benign paroxysmal positional vertigo) 08/28/2015   Hypertension associated with type 2 diabetes mellitus ([place]) 11/09/2014   GAD (generalized anxiety disorder)  11/09/2014   Acanthosis nigricans 11/09/2014   Morbid obesity ([place]) 01/06/2012    Past Surgical History:  Procedure Laterality Date   ABDOMINAL HYSTERECTOMY     BREAST EXCISIONAL BIOPSY Left    ENDOMETRIAL ABLATION     x2   ENDOSCOPIC CONCHA BULLOSA RESECTION Bilateral 08/08/2020   Procedure: ENDOSCOPIC CONCHA BULLOSA RESECTION;  Surgeon: Margaretha Sheffield, MD;  Location: [facility] ORS;  Service: ENT;  Laterality: Bilateral;   ETHMOIDECTOMY Right 08/08/2020   Procedure: ETHMOIDECTOMY, LEFT PARTIAL ETHMOIDECTOMY;  Surgeon: Margaretha Sheffield, MD;  Location: [facility] OR[facility];  Service: ENT;   Laterality: Right;   IMAGE GUIDED SINUS SURGERY N/A 08/08/2020   Procedure: IMAGE GUIDED SINUS SURGERY, RIGHT FRONTAL SINUSOTOMY;  Surgeon: Margaretha Sheffield, MD;  Location: [facility];  Service: ENT;  Laterality: N/A;   LAPAROSCOPIC SLEEVE GASTRECTOMY     MAXILLARY ANTROSTOMY Bilateral 08/08/2020   Procedure: MAXILLARY ANTROSTOMY with tissue;  Surgeon: Margaretha Sheffield, MD;  Location: [facility];  Service: ENT;  Laterality: Bilateral;   NASAL TURBINATE REDUCTION  08/27/2020   Procedure: TURBINATE REDUCTION /SUBMUCOSAL DEBRIDEMENT;  Surgeon: Margaretha Sheffield, MD;  Location: [facility];  Service: ENT;;   TOTAL KNEE ARTHROPLASTY Right 06/2019   TOTAL KNEE ARTHROPLASTY Left 01/31/2021   [facility]    Family History  Problem Relation Age of Onset   Diabetes Mother    High blood pressure Mother    Heart disease Father    Cancer Brother    Mental illness Neg Hx     Social History   Tobacco Use   Smoking status: Never   Smokeless tobacco: Never  Substance Use Topics   Alcohol use: No    Alcohol/week: 0.0 standard drinks    Comment: rare     Current Outpatient Medications:    acetaminophen (TYLENOL) 650 MG CR tablet, Take 1 tablet (650 mg total) by mouth every 8 (eight) hours as needed for pain., Disp: , Rfl:    amLODipine (NORVASC) 10 MG tablet, TAKE 1 TABLET BY MOUTH EVERY DAY, Disp: 90 tablet, Rfl: 1   apixaban (ELIQUIS) 5 MG TABS tablet, Take 1 tablet (5 mg total) by mouth 2 (two) times daily. Please call the office to schedule an appointment., Disp: 180 tablet, Rfl: 0   ASMANEX, 60 METERED DOSES, 220 MCG/ACT inhaler, INHALE 2 PUFFS BY MOUTH INTO THE LUNGS DAILY, Disp: 3 each, Rfl: 0   atorvastatin (LIPITOR) 10 MG tablet, Take 1 tablet (10 mg total) by mouth daily., Disp: 90 tablet, Rfl: 3   furosemide (LASIX) 20 MG tablet, Take 1 tablet (20 mg total) by mouth daily as needed., Disp: 30 tablet, Rfl: 0   meclizine (ANTIVERT) 12.5 MG tablet, Take 1 tablet (12.5 mg total) by  mouth 3 (three) times daily as needed for dizziness., Disp: 30 tablet, Rfl: 0   metoprolol tartrate (LOPRESSOR) 100 MG tablet, TAKE 1 TABLET BY MOUTH TWICE A DAY, Disp: 180 tablet, Rfl: 1   potassium chloride (KLOR-CON M) 10 MEQ tablet, Take 2 tablets (20 mEq total) by mouth daily. Take with Lasix, Disp: 60 tablet, Rfl: 0   Semaglutide, 2 MG/DOSE, (OZEMPIC, 2 MG/DOSE,) 8 MG/3ML SOPN, Inject 2 mg into the skin once a week., Disp: 3 mL, Rfl: 0   tiZANidine (ZANAFLEX) 4 MG tablet, , Disp: , Rfl:    triamcinolone (NASACORT) 55 MCG/ACT AERO nasal inhaler, Place 2 sprays into the nose daily., Disp: , Rfl:    venlafaxine XR (EFFEXOR-XR) 37.5 MG 24 hr capsule, TAKE 1 CAPSULE (37.5 MG  TOTAL) BY MOUTH DAILY WITH BREAKFAST. TAKE ALONG WITH 75 MG DAILY, Disp: 90 capsule, Rfl: 1   venlafaxine XR (EFFEXOR-XR) 75 MG 24 hr capsule, TAKE 1 CAPSULE (75 MG TOTAL) BY MOUTH DAILY WITH BREAKFAST. TAKE ALONG WITH 37.5 MG DAILY, Disp: 90 capsule, Rfl: 1   Vitamin D, Ergocalciferol, (DRISDOL) 1.25 MG (50000 UNIT) CAPS capsule, Take 1 capsule (50,000 Units total) by mouth every 7 (seven) days., Disp: 12 capsule, Rfl: 1   XIIDRA 5 % SOLN, Place 1 drop into both eyes 2 (two) times daily., Disp: , Rfl: 4   zolpidem (AMBIEN) 5 MG tablet, Take 0.5-1 tablets (2.5-5 mg total) by mouth at bedtime as needed for sleep., Disp: 10 tablet, Rfl: 1   famotidine (PEPCID) 20 MG tablet, Take 1 tablet (20 mg total) by mouth daily., Disp: 90 tablet, Rfl: 1   olmesartan (BENICAR) 40 MG tablet, Take 1 tablet (40 mg total) by mouth daily., Disp: 90 tablet, Rfl: 1  Allergies  Allergen Reactions   Hydrocodone-Acetaminophen Other (See Comments)    Extreme headache   Ace Inhibitors Cough   Hctz [Hydrochlorothiazide] Rash    face    I personally reviewed active problem list, medication list, allergies, family history, social history, health maintenance with the patient/caregiver today.   ROS  Constitutional: Negative for fever or weight  change.  Respiratory: Negative for cough and shortness of breath.   Cardiovascular: Negative for chest pain or palpitations.  Gastrointestinal: Negative for abdominal pain, no bowel changes.  Musculoskeletal: Negative for gait problem or joint swelling.  Skin: Negative for rash.  Neurological: Negative for dizziness or headache.  No other specific complaints in a complete review of systems (except as listed in HPI above).   Objective  Vitals:   09/10/21 0745  BP: 122/80  Pulse: 88  Resp: 16  SpO2: 97%  Weight: 254 lb (115.2 kg)  Height: '5\' 8"'  (1.727 m)    Body mass index is 38.62 kg/m.  Physical Exam  Constitutional: Patient appears well-developed and well-nourished. Obese  No distress.  HEENT: head atraumatic, normocephalic, pupils equal and reactive to light, neck supple Cardiovascular: Normal rate, regular rhythm and normal heart sounds.  No murmur heard. No BLE edema. Pulmonary/Chest: Effort normal and breath sounds normal. No respiratory distress. Abdominal: Soft.  There is no tenderness. Psychiatric: Patient has a normal mood and affect. behavior is normal. Judgment and thought content normal.   Recent Results (from the past 2160 hour(s))  Hemoglobin A1c     Status: Abnormal   Collection Time: 08/28/21  9:04 AM  Result Value Ref Range   Hgb A1c MFr Bld 5.8 (H) 4.8 - 5.6 %    Comment:          Prediabetes: 5.7 - 6.4          Diabetes: >6.4          Glycemic control for adults with diabetes: <7.0    Est. average glucose Bld gHb Est-mCnc 120 mg/dL  Insulin, random     Status: None   Collection Time: 08/28/21  9:04 AM  Result Value Ref Range   INSULIN 8.0 2.6 - 24.9 uIU/mL  VITAMIN D 25 Hydroxy (Vit-D Deficiency, Fractures)     Status: None   Collection Time: 08/28/21  9:04 AM  Result Value Ref Range   Vit D, 25-Hydroxy 38.1 30.0 - 100.0 ng/mL    Comment: Vitamin D deficiency has been defined by the Institute of Medicine and an Endocrine Society practice  guideline  as a level of serum 25-OH vitamin D less than 20 ng/mL (1,2). The Endocrine Society went on to further define vitamin D insufficiency as a level between 21 and 29 ng/mL (2). 1. IOM (Institute of Medicine). 2010. Dietary reference    intakes for calcium and D. [place]: The    [employer]. 2. Holick MF, Binkley Rowlett, Bischoff-Ferrari HA, et al.    Evaluation, treatment, and prevention of vitamin D    deficiency: an Endocrine Society clinical practice    guideline. JCEM. 2011 Jul; 96(7):1911-30.   Comprehensive metabolic panel     Status: Abnormal   Collection Time: 08/28/21  9:04 AM  Result Value Ref Range   Glucose 88 70 - 99 mg/dL   BUN 10 8 - 27 mg/dL   Creatinine, Ser 0.70 0.57 - 1.00 mg/dL   eGFR 96 >59 mL/min/1.73   BUN/Creatinine Ratio 14 12 - 28   Sodium 145 (H) 134 - 144 mmol/L   Potassium 3.6 3.5 - 5.2 mmol/L   Chloride 106 96 - 106 mmol/L   CO2 26 20 - 29 mmol/L   Calcium 9.3 8.7 - 10.3 mg/dL   Total Protein 6.9 6.0 - 8.5 g/dL   Albumin 4.0 3.8 - 4.8 g/dL   Globulin, Total 2.9 1.5 - 4.5 g/dL   Albumin/Globulin Ratio 1.4 1.2 - 2.2   Bilirubin Total 0.3 0.0 - 1.2 mg/dL   Alkaline Phosphatase 100 44 - 121 IU/L   AST 15 0 - 40 IU/L   ALT 12 0 - 32 IU/L    Diabetic Foot Exam: Diabetic Foot Exam - Simple   Simple Foot Form Visual Inspection No deformities, no ulcerations, no other skin breakdown bilaterally: Yes Sensation Testing Intact to touch and monofilament testing bilaterally: Yes Pulse Check Posterior Tibialis and Dorsalis pulse intact bilaterally: Yes Comments      PHQ2/9:    09/10/2021    7:44 AM 07/16/2021    7:47 AM 06/28/2021    8:45 AM 04/08/2021    2:35 PM 03/13/2021    7:32 AM  Depression screen PHQ 2/9  Decreased Interest 0 0  0 0  Down, Depressed, Hopeless 0 0  0 0  PHQ - 2 Score 0 0  0 0  Altered sleeping 0 0  0 1  Tired, decreased energy 0 0  0 0  Change in appetite 0 0  0 0  Feeling bad or failure about  yourself  0 0  0 0  Trouble concentrating 0 0  0 0  Moving slowly or fidgety/restless 0 0  0 0  Suicidal thoughts 0 0  0 0  PHQ-9 Score 0 0  0 1  Difficult doing work/chores  Not difficult at all  Not difficult at all      Information is confidential and restricted. Go to Review Flowsheets to unlock data.    phq 9 is negative   Fall Risk:    09/10/2021    7:44 AM 07/16/2021    7:47 AM 04/08/2021    2:34 PM 03/13/2021    7:32 AM 12/10/2020    8:36 AM  Fall Risk   Falls in the past year? 0 0 0 0 0  Number falls in past yr: 0 0 0 0 0  Injury with Fall? 0 0 0 0 0  Risk for fall due to : No Fall Risks  No Fall Risks No Fall Risks   Follow up Falls prevention discussed  Falls prevention discussed Falls prevention  discussed Falls evaluation completed      Functional Status Survey: Is the patient deaf or have difficulty hearing?: No Does the patient have difficulty seeing, even when wearing glasses/contacts?: No Does the patient have difficulty concentrating, remembering, or making decisions?: No Does the patient have difficulty walking or climbing stairs?: No Does the patient have difficulty dressing or bathing?: No Does the patient have difficulty doing errands alone such as visiting a doctor's office or shopping?: No    Assessment & Plan  Problem List Items Addressed This Visit     Morbid obesity ([place])    Seeing [facility]        Hypertension associated with type 2 diabetes mellitus ([place]) - Primary    A1C has bee normal        Relevant Medications   olmesartan (BENICAR) 40 MG tablet   MDD (major depressive disorder), recurrent, in full remission ([place])    Continue follow up with Dr. Shea Evans        Insomnia due to mental condition    Not tolerating Ambien, advised to discuss it Dr. Shea Evans        History of CVA (cerebrovascular accident) without residual deficits    On Eliquis       GERD without esophagitis    Continue prn Pepcid         Relevant Medications   famotidine (PEPCID) 20 MG tablet   Chronic diastolic heart failure ([facility])    On ARB and beta blocker, no symptoms        Relevant Medications   olmesartan (BENICAR) 40 MG tablet   History of total bilateral knee replacement   RESOLVED: A-fib ([place])   Relevant Medications   olmesartan (BENICAR) 40 MG tablet   OSA (obstructive sleep apnea)   Other Visit Diagnoses     Need for pneumococcal 20-valent conjugate vaccination       Relevant Orders   Pneumococcal conjugate vaccine 20-valent (Prevnar 20)

## 2021-09-10 ENCOUNTER — Ambulatory Visit (INDEPENDENT_AMBULATORY_CARE_PROVIDER_SITE_OTHER): Payer: Medicare HMO | Admitting: Family Medicine

## 2021-09-10 ENCOUNTER — Encounter: Payer: Self-pay | Admitting: Family Medicine

## 2021-09-10 VITALS — BP 122/80 | HR 88 | Resp 16 | Ht 68.0 in | Wt 254.0 lb

## 2021-09-10 DIAGNOSIS — Z96653 Presence of artificial knee joint, bilateral: Secondary | ICD-10-CM | POA: Insufficient documentation

## 2021-09-10 DIAGNOSIS — I5032 Chronic diastolic (congestive) heart failure: Secondary | ICD-10-CM

## 2021-09-10 DIAGNOSIS — R69 Illness, unspecified: Secondary | ICD-10-CM | POA: Diagnosis not present

## 2021-09-10 DIAGNOSIS — G4733 Obstructive sleep apnea (adult) (pediatric): Secondary | ICD-10-CM | POA: Diagnosis not present

## 2021-09-10 DIAGNOSIS — I4819 Other persistent atrial fibrillation: Secondary | ICD-10-CM | POA: Diagnosis not present

## 2021-09-10 DIAGNOSIS — Z13820 Encounter for screening for osteoporosis: Secondary | ICD-10-CM

## 2021-09-10 DIAGNOSIS — K219 Gastro-esophageal reflux disease without esophagitis: Secondary | ICD-10-CM

## 2021-09-10 DIAGNOSIS — F325 Major depressive disorder, single episode, in full remission: Secondary | ICD-10-CM | POA: Insufficient documentation

## 2021-09-10 DIAGNOSIS — I152 Hypertension secondary to endocrine disorders: Secondary | ICD-10-CM | POA: Diagnosis not present

## 2021-09-10 DIAGNOSIS — Z23 Encounter for immunization: Secondary | ICD-10-CM | POA: Diagnosis not present

## 2021-09-10 DIAGNOSIS — I10 Essential (primary) hypertension: Secondary | ICD-10-CM

## 2021-09-10 DIAGNOSIS — E1159 Type 2 diabetes mellitus with other circulatory complications: Secondary | ICD-10-CM | POA: Diagnosis not present

## 2021-09-10 DIAGNOSIS — F3342 Major depressive disorder, recurrent, in full remission: Secondary | ICD-10-CM | POA: Diagnosis not present

## 2021-09-10 DIAGNOSIS — Z8673 Personal history of transient ischemic attack (TIA), and cerebral infarction without residual deficits: Secondary | ICD-10-CM

## 2021-09-10 DIAGNOSIS — F5105 Insomnia due to other mental disorder: Secondary | ICD-10-CM

## 2021-09-10 DIAGNOSIS — I5043 Acute on chronic combined systolic (congestive) and diastolic (congestive) heart failure: Secondary | ICD-10-CM | POA: Insufficient documentation

## 2021-09-10 MED ORDER — OLMESARTAN MEDOXOMIL 40 MG PO TABS: 40.0000 mg | ORAL_TABLET | Freq: Every day | ORAL | 1 refills | Status: DC

## 2021-09-10 MED ORDER — FAMOTIDINE 20 MG PO TABS: 20.0000 mg | ORAL_TABLET | Freq: Every day | ORAL | 1 refills | Status: DC

## 2021-09-10 NOTE — Assessment & Plan Note (Signed)
On ARB and beta blocker, no symptoms

## 2021-09-10 NOTE — Assessment & Plan Note (Signed)
A1C has bee normal

## 2021-09-10 NOTE — Assessment & Plan Note (Signed)
Not tolerating Ambien, advised to discuss it Dr. Elna Breslow

## 2021-09-10 NOTE — Assessment & Plan Note (Signed)
Continue follow up with Dr. Shea Evans

## 2021-09-10 NOTE — Assessment & Plan Note (Signed)
Continue prn Pepcid

## 2021-09-10 NOTE — Assessment & Plan Note (Signed)
On Eliquis

## 2021-09-10 NOTE — Assessment & Plan Note (Signed)
Seeing [facility]

## 2021-09-19 ENCOUNTER — Other Ambulatory Visit (INDEPENDENT_AMBULATORY_CARE_PROVIDER_SITE_OTHER): Payer: Self-pay | Admitting: Physician Assistant

## 2021-09-19 DIAGNOSIS — R600 Localized edema: Secondary | ICD-10-CM

## 2021-09-23 ENCOUNTER — Other Ambulatory Visit (INDEPENDENT_AMBULATORY_CARE_PROVIDER_SITE_OTHER): Payer: Self-pay | Admitting: Adult Health

## 2021-09-23 DIAGNOSIS — E1169 Type 2 diabetes mellitus with other specified complication: Secondary | ICD-10-CM

## 2021-09-24 ENCOUNTER — Ambulatory Visit: Payer: Medicare HMO | Admitting: Cardiology

## 2021-09-24 ENCOUNTER — Encounter: Payer: Self-pay | Admitting: Cardiology

## 2021-09-24 VITALS — BP 100/70 | HR 73 | Ht 68.0 in | Wt 253.5 lb

## 2021-09-24 DIAGNOSIS — Z6838 Body mass index (BMI) 38.0-38.9, adult: Secondary | ICD-10-CM | POA: Diagnosis not present

## 2021-09-24 DIAGNOSIS — R600 Localized edema: Secondary | ICD-10-CM

## 2021-09-24 DIAGNOSIS — I480 Paroxysmal atrial fibrillation: Secondary | ICD-10-CM | POA: Diagnosis not present

## 2021-09-24 DIAGNOSIS — I10 Essential (primary) hypertension: Secondary | ICD-10-CM

## 2021-09-24 MED ORDER — AMLODIPINE BESYLATE 5 MG PO TABS: 5.0000 mg | ORAL_TABLET | Freq: Every day | ORAL | 3 refills | Status: DC

## 2021-09-24 NOTE — Patient Instructions (Signed)
Medication Instructions:   Your physician has recommended you make the following change in your medication:    DECREASE your Amlodipine (Norvasc) to 5 MG once a day.  *If you need a refill on your cardiac medications before your next appointment, please call your pharmacy*    Follow-Up: At [facility], you and your health needs are our priority.  As part of our continuing mission to provide you with exceptional heart care, we have created designated Provider Care Teams.  These Care Teams include your primary Cardiologist (physician) and Advanced Practice Providers (APPs -  Physician Assistants and Nurse Practitioners) who all work together to provide you with the care you need, when you need it.  We recommend signing up for the patient portal called "MyChart".  Sign up information is provided on this After Visit Summary.  MyChart is used to connect with patients for Virtual Visits (Telemedicine).  Patients are able to view lab/test results, encounter notes, upcoming appointments, etc.  Non-urgent messages can be sent to your provider as well.   To learn more about what you can do with MyChart, go to [email or web address].    Your next appointment:   2 month(s)  The format for your next appointment:   In Person  Provider:   You may see Debbe Odea, MD or one of the following Advanced Practice Providers on your designated Care Team:   Nicolasa Ducking, NP Eula Listen, PA-C Cadence Fransico Michael, [place]    Other Instructions   Important Information About Sugar

## 2021-09-24 NOTE — Progress Notes (Signed)
Cardiology Office Note:    Date:  09/24/2021   ID:  Regina Christensen, DOB 1955/11/22, MRN [id number]  PCP:  [name], MD  Cardiologist:  Debbe Odea, MD  Electrophysiologist:  None   Referring MD: [name], MD   Chief Complaint  Patient presents with   Other    12 Month f/u no complaints today. Meds reviewed verbally with pt.    History of Present Illness:    Regina Christensen is a 66 y.o. female with a hx of hypertension, paroxysmal A. fib on Eliquis, CVA 02/2020 (2/2 not taking eliquis), obesity, sleep apnea who presents for follow-up.    Being seen for paroxysmal atrial fibrillation and hypertension.  Denies palpitations, tolerating Eliquis without any bleeding effects.  Endorses some leg edema which gets worse as the day progresses.  Takes medications as prescribed.  Blood pressures have been adequately controlled.   Prior notes Echocardiogram 02/2020 EF 55 to 60%, mildly dilated left atrium    Past Medical History:  Diagnosis Date   Anxiety    Arthritis    Back pain    BPPV (benign paroxysmal positional vertigo)    Chronic diastolic HF (heart failure) (HCC)    Complication of anesthesia    Post-operative hypoxia requiring supplemental oxygen   Current use of long term anticoagulation    Apixaban   CVA (cerebral vascular accident) (HCC) 02/28/2020   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; imaging from [facility]   Depression    Edema of both lower extremities    GERD (gastroesophageal reflux disease)    Hypertension    IBS (irritable bowel syndrome)    Lactose intolerance    Morbid obesity with BMI of 45.0-49.9, adult (HCC)    Obesity    Osteoarthritis    PAF (paroxysmal atrial fibrillation) (HCC)    Pre-diabetes    Sleep apnea    uses CPAP machine sometimes    Type 2 diabetes mellitus without complication (HCC)    Vitamin D deficiency     Past Surgical History:  Procedure Laterality Date   ABDOMINAL HYSTERECTOMY     BREAST  EXCISIONAL BIOPSY Left    ENDOMETRIAL ABLATION     x2   ENDOSCOPIC CONCHA BULLOSA RESECTION Bilateral 08/08/2020   Procedure: ENDOSCOPIC CONCHA BULLOSA RESECTION;  Surgeon: Vernie Murders, MD;  Location: [facility];  Service: ENT;  Laterality: Bilateral;   ETHMOIDECTOMY Right 08/08/2020   Procedure: ETHMOIDECTOMY, LEFT PARTIAL ETHMOIDECTOMY;  Surgeon: Vernie Murders, MD;  Location: [facility];  Service: ENT;  Laterality: Right;   IMAGE GUIDED SINUS SURGERY N/A 08/08/2020   Procedure: IMAGE GUIDED SINUS SURGERY, RIGHT FRONTAL SINUSOTOMY;  Surgeon: Vernie Murders, MD;  Location: [facility];  Service: ENT;  Laterality: N/A;   LAPAROSCOPIC SLEEVE GASTRECTOMY     MAXILLARY ANTROSTOMY Bilateral 08/08/2020   Procedure: MAXILLARY ANTROSTOMY with tissue;  Surgeon: Vernie Murders, MD;  Location: [facility];  Service: ENT;  Laterality: Bilateral;   NASAL TURBINATE REDUCTION  08/27/2020   Procedure: TURBINATE REDUCTION /SUBMUCOSAL DEBRIDEMENT;  Surgeon: Vernie Murders, MD;  Location: [facility];  Service: ENT;;   TOTAL KNEE ARTHROPLASTY Right 06/2019   TOTAL KNEE ARTHROPLASTY Left 01/31/2021   [facility]    Current Medications: Current Meds  Medication Sig   acetaminophen (TYLENOL) 650 MG CR tablet Take 1 tablet (650 mg total) by mouth every 8 (eight) hours as needed for pain.   apixaban (ELIQUIS) 5 MG TABS tablet Take 1 tablet (5 mg total) by mouth  2 (two) times daily. Please call the office to schedule an appointment.   atorvastatin (LIPITOR) 10 MG tablet Take 1 tablet (10 mg total) by mouth daily.   famotidine (PEPCID) 20 MG tablet Take 1 tablet (20 mg total) by mouth daily.   furosemide (LASIX) 20 MG tablet Take 1 tablet (20 mg total) by mouth daily as needed.   meclizine (ANTIVERT) 12.5 MG tablet Take 1 tablet (12.5 mg total) by mouth 3 (three) times daily as needed for dizziness.   metoprolol tartrate (LOPRESSOR) 100 MG tablet TAKE 1 TABLET BY MOUTH TWICE A DAY   olmesartan (BENICAR)  40 MG tablet Take 1 tablet (40 mg total) by mouth daily.   potassium chloride (KLOR-CON M) 10 MEQ tablet Take 2 tablets (20 mEq total) by mouth daily. Take with Lasix   Semaglutide, 2 MG/DOSE, (OZEMPIC, 2 MG/DOSE,) 8 MG/3ML SOPN Inject 2 mg into the skin once a week.   tiZANidine (ZANAFLEX) 4 MG tablet    triamcinolone (NASACORT) 55 MCG/ACT AERO nasal inhaler Place 2 sprays into the nose daily.   venlafaxine XR (EFFEXOR-XR) 37.5 MG 24 hr capsule TAKE 1 CAPSULE (37.5 MG TOTAL) BY MOUTH DAILY WITH BREAKFAST. TAKE ALONG WITH 75 MG DAILY   venlafaxine XR (EFFEXOR-XR) 75 MG 24 hr capsule TAKE 1 CAPSULE (75 MG TOTAL) BY MOUTH DAILY WITH BREAKFAST. TAKE ALONG WITH 37.5 MG DAILY   Vitamin D, Ergocalciferol, (DRISDOL) 1.25 MG (50000 UNIT) CAPS capsule Take 1 capsule (50,000 Units total) by mouth every 7 (seven) days.   XIIDRA 5 % SOLN Place 1 drop into both eyes 2 (two) times daily.   zolpidem (AMBIEN) 5 MG tablet Take 0.5-1 tablets (2.5-5 mg total) by mouth at bedtime as needed for sleep.   [DISCONTINUED] amLODipine (NORVASC) 10 MG tablet TAKE 1 TABLET BY MOUTH EVERY DAY     Allergies:   Hydrocodone-acetaminophen, Ace inhibitors, and Hctz [hydrochlorothiazide]   Social History   Socioeconomic History   Marital status: Divorced    Spouse name: Not on file   Number of children: 2   Years of education: Not on file   Highest education level: Associate degree: occupational, Scientist, product/process development, or vocational program  Occupational History   Occupation: Retired/ work PT    Employer: [employer]  Tobacco Use   Smoking status: Never   Smokeless tobacco: Never  Building services engineer Use: Never used  Substance and Sexual Activity   Alcohol use: No    Alcohol/week: 0.0 standard drinks    Comment: rare   Drug use: No   Sexual activity: Not on file  Other Topics Concern   Not on file  Social History Narrative   Daughter lives with her    Stressed at work, needs to meet quotas on 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 There is no history of Mental illness.  Her sister has atrial fibrillation, multiple nephews have atrial fibrillation.  ROS:   Please see the history of present illness.     All other systems reviewed  and are negative.  EKGs/Labs/Other Studies Reviewed:      EKG:  EKG is  ordered today.  The ekg ordered today demonstrates normal sinus rhythm  Recent Labs: 11/14/2020: TSH 1.250 03/13/2021: Hemoglobin 12.4; Platelets 382 08/28/2021: ALT 12; BUN 10; Creatinine, Ser 0.70; Potassium 3.6; Sodium 145  Recent Lipid Panel    Component Value Date/Time   CHOL 130 03/13/2021 0827   CHOL 151 12/05/2014 1117   TRIG 77 03/13/2021 0827   HDL 68 03/13/2021 0827   HDL 74 12/05/2014 1117   CHOLHDL 1.9 03/13/2021 0827   VLDL 14 03/01/2020 0306   LDLCALC 46 03/13/2021 0827    Physical Exam:    VS:  BP 100/70 (BP Location: Left Arm, Patient Position: Sitting, Cuff Size: Large)   Pulse 73   Ht 5\' 8"  (1.727 m)   Wt 253 lb 8 oz (115 kg)   SpO2 98%   BMI 38.54 kg/m     Wt Readings from Last 3 Encounters:  09/24/21 253 lb 8 oz (115 kg)  09/10/21 254 lb (115.2 kg)  08/28/21 251 lb (113.9 kg)     GEN:  Well nourished, well developed in no acute distress HEENT: Normal NECK: No JVD; No carotid bruits LYMPHATICS: No lymphadenopathy CARDIAC:RRR, no murmurs, rubs, gallops RESPIRATORY:  Clear to auscultation without rales, wheezing or rhonchi  ABDOMEN: Soft, non-tender, distended MUSCULOSKELETAL:  1+ edema; No deformity  SKIN: Warm and dry NEUROLOGIC:  Alert and  oriented x 3 PSYCHIATRIC:  Normal affect   ASSESSMENT:    1. Paroxysmal atrial fibrillation (HCC)   2. Primary hypertension   3. BMI 38.0-38.9,adult   4. Leg edema      PLAN:     Paroxysmal atrial fibrillation.  EKG today shows sinus rhythm.  Asymptomatic.  CHA2DS2-VASc score of 4 (htn, gender, CVA).  Continue Eliquis 5 mg twice daily, continue Lopressor 100 mg twice daily. History of hypertension, BP low normal.  Leg edema.  Possibly from Norvasc.  Decrease Norvasc to 5 mg daily, continue Lopressor.  If BP becomes elevated, consider switch from Lopressor to Coreg. Obesity, low-calorie diet, weight loss advised. Leg edema, reduce Norvasc to 5 mg as above.  Compression stockings, leg raise advised.  Okay to take Lasix as needed.   Follow-up 2  months   This note was generated in part or whole with voice recognition software. Voice recognition is usually quite accurate but there are transcription errors that can and very often do occur. I apologize for any typographical errors that were not detected and corrected.  Medication Adjustments/Labs and Tests Ordered: Current medicines are reviewed at length with the patient today.  Concerns regarding medicines are outlined above.  Orders Placed This Encounter  Procedures   EKG 12-Lead    Meds ordered this encounter  Medications   amLODipine (NORVASC) 5 MG tablet    Sig: Take 1 tablet (5 mg total) by mouth daily.    Dispense:  30 tablet    Refill:  3     Patient Instructions  Medication Instructions:   Your physician has recommended you make the following change in your medication:    DECREASE your Amlodipine (Norvasc) to 5 MG once a day.  *If you need a refill on your cardiac medications before your next appointment, please call your pharmacy*    Follow-Up: At [facility], you and your health needs are our priority.  As part of our continuing mission to provide you with exceptional heart care, we have created  designated Provider Care Teams.  These Care Teams include your primary Cardiologist (physician) and Advanced Practice Providers (APPs -  Physician Assistants and Nurse Practitioners) who all work together to provide you with the care you need, when you need it.  We recommend signing up for the patient portal called "MyChart".  Sign up information is provided on this After Visit Summary.  MyChart is used to connect with patients for Virtual Visits (Telemedicine).  Patients are able to view lab/test results, encounter notes, upcoming appointments, etc.  Non-urgent messages can be sent to your provider as well.   To learn more about what you can do with MyChart, go to [email or web address].    Your next appointment:   2 month(s)  The format for your next appointment:   In Person  Provider:   You may see Debbe OdeaBrian Agbor-Etang, MD or one of the following Advanced Practice Providers on your designated Care Team:   Nicolasa Duckinghristopher Berge, NP Eula Listenyan Dunn, PA-C Cadence Fransico MichaelFurth, [place]PA-C    Other Instructions   Important Information About Sugar         Signed, Debbe OdeaBrian Agbor-Etang, MD  09/24/2021 10:11 AM     [facility]

## 2021-09-25 ENCOUNTER — Encounter (INDEPENDENT_AMBULATORY_CARE_PROVIDER_SITE_OTHER): Payer: Self-pay | Admitting: Adult Health

## 2021-09-25 ENCOUNTER — Ambulatory Visit (INDEPENDENT_AMBULATORY_CARE_PROVIDER_SITE_OTHER): Payer: Medicare HMO | Admitting: Physician Assistant

## 2021-09-25 ENCOUNTER — Ambulatory Visit (INDEPENDENT_AMBULATORY_CARE_PROVIDER_SITE_OTHER): Payer: Medicare HMO | Admitting: Adult Health

## 2021-09-25 VITALS — BP 118/75 | HR 74 | Temp 98.1°F | Ht 68.0 in | Wt 250.0 lb

## 2021-09-25 DIAGNOSIS — Z7985 Long-term (current) use of injectable non-insulin antidiabetic drugs: Secondary | ICD-10-CM | POA: Diagnosis not present

## 2021-09-25 DIAGNOSIS — E669 Obesity, unspecified: Secondary | ICD-10-CM

## 2021-09-25 DIAGNOSIS — R600 Localized edema: Secondary | ICD-10-CM | POA: Diagnosis not present

## 2021-09-25 DIAGNOSIS — E559 Vitamin D deficiency, unspecified: Secondary | ICD-10-CM

## 2021-09-25 DIAGNOSIS — E1169 Type 2 diabetes mellitus with other specified complication: Secondary | ICD-10-CM

## 2021-09-25 DIAGNOSIS — Z6841 Body Mass Index (BMI) 40.0 and over, adult: Secondary | ICD-10-CM

## 2021-09-25 MED ORDER — VITAMIN D (ERGOCALCIFEROL) 1.25 MG (50000 UNIT) PO CAPS: 50000.0000 [IU] | ORAL_CAPSULE | ORAL | 1 refills | Status: DC

## 2021-09-25 MED ORDER — OZEMPIC (2 MG/DOSE) 8 MG/3ML ~~LOC~~ SOPN: 2.0000 mg | PEN_INJECTOR | SUBCUTANEOUS | 0 refills | Status: DC

## 2021-09-27 ENCOUNTER — Telehealth (INDEPENDENT_AMBULATORY_CARE_PROVIDER_SITE_OTHER): Payer: Medicare HMO | Admitting: Psychiatry

## 2021-09-27 ENCOUNTER — Encounter: Payer: Self-pay | Admitting: Psychiatry

## 2021-09-27 DIAGNOSIS — R69 Illness, unspecified: Secondary | ICD-10-CM | POA: Diagnosis not present

## 2021-09-27 DIAGNOSIS — F3342 Major depressive disorder, recurrent, in full remission: Secondary | ICD-10-CM | POA: Diagnosis not present

## 2021-09-27 DIAGNOSIS — F411 Generalized anxiety disorder: Secondary | ICD-10-CM | POA: Diagnosis not present

## 2021-09-27 DIAGNOSIS — F5101 Primary insomnia: Secondary | ICD-10-CM | POA: Diagnosis not present

## 2021-09-27 MED ORDER — HYDROXYZINE HCL 25 MG PO TABS: 12.5000 mg | ORAL_TABLET | Freq: Every evening | ORAL | 1 refills | Status: DC | PRN

## 2021-09-27 MED ORDER — VENLAFAXINE HCL ER 75 MG PO CP24: 75.0000 mg | ORAL_CAPSULE | Freq: Every day | ORAL | 1 refills | Status: DC

## 2021-09-27 MED ORDER — VENLAFAXINE HCL ER 37.5 MG PO CP24: 37.5000 mg | ORAL_CAPSULE | Freq: Every day | ORAL | 1 refills | Status: DC

## 2021-09-27 NOTE — Patient Instructions (Signed)
Hydroxyzine Capsules or Tablets What is this medication? HYDROXYZINE (hye DROX i zeen) treats the symptoms of allergies and allergic reactions. It may also be used to treat anxiety or cause drowsiness before a procedure. It works by blocking histamine, a substance released by the body during an allergic reaction. It belongs to a group of medications called antihistamines. This medicine may be used for other purposes; ask your health care provider or pharmacist if you have questions. COMMON BRAND NAME(S): ANX, Atarax, Rezine, Vistaril What should I tell my care team before I take this medication? They need to know if you have any of these conditions: Glaucoma Heart disease History of irregular heartbeat Kidney disease Liver disease Lung or breathing disease, like asthma Stomach or intestine problems Thyroid disease Trouble passing urine An unusual or allergic reaction to hydroxyzine, cetirizine, other medications, foods, dyes or preservatives Pregnant or trying to get pregnant Breast-feeding How should I use this medication? Take this medication by mouth with a full glass of water. Follow the directions on the prescription label. You may take this medication with food or on an empty stomach. Take your medication at regular intervals. Do not take your medication more often than directed. Talk to your care team regarding the use of this medication in children. Special care may be needed. While this medication may be prescribed for children as young as 6 years of age for selected conditions, precautions do apply. Patients over 65 years old may have a stronger reaction and need a smaller dose. Overdosage: If you think you have taken too much of this medicine contact a poison control center or emergency room at once. NOTE: This medicine is only for you. Do not share this medicine with others. What if I miss a dose? If you miss a dose, take it as soon as you can. If it is almost time for your next  dose, take only that dose. Do not take double or extra doses. What may interact with this medication? Do not take this medication with any of the following: Cisapride Dronedarone Pimozide Thioridazine This medication may also interact with the following: Alcohol Antihistamines for allergy, cough, and cold Atropine Barbiturate medications for sleep or seizures, like phenobarbital Certain antibiotics like erythromycin or clarithromycin Certain medications for anxiety or sleep Certain medications for bladder problems like oxybutynin, tolterodine Certain medications for depression or psychotic disturbances Certain medications for irregular heart beat Certain medications for Parkinson's disease like benztropine, trihexyphenidyl Certain medications for seizures like phenobarbital, primidone Certain medications for stomach problems like dicyclomine, hyoscyamine Certain medications for travel sickness like scopolamine Ipratropium Narcotic medications for pain Other medications that prolong the QT interval (which can cause an abnormal heart rhythm) like dofetilide This list may not describe all possible interactions. Give your health care provider a list of all the medicines, herbs, non-prescription drugs, or dietary supplements you use. Also tell them if you smoke, drink alcohol, or use illegal drugs. Some items may interact with your medicine. What should I watch for while using this medication? Tell your care team if your symptoms do not improve. You may get drowsy or dizzy. Do not drive, use machinery, or do anything that needs mental alertness until you know how this medication affects you. Do not stand or sit up quickly, especially if you are an older patient. This reduces the risk of dizzy or fainting spells. Alcohol may interfere with the effect of this medication. Avoid alcoholic drinks. Your mouth may get dry. Chewing sugarless gum or sucking hard   candy, and drinking plenty of water may  help. Contact your care team if the problem does not go away or is severe. This medication may cause dry eyes and blurred vision. If you wear contact lenses you may feel some discomfort. Lubricating drops may help. See your eye care specialist if the problem does not go away or is severe. If you are receiving skin tests for allergies, tell your care team you are using this medication. What side effects may I notice from receiving this medication? Side effects that you should report to your care team as soon as possible: Allergic reactions--skin rash, itching, hives, swelling of the face, lips, tongue, or throat Heart rhythm changes--fast or irregular heartbeat, dizziness, feeling faint or lightheaded, chest pain, trouble breathing Side effects that usually do not require medical attention (report to your care team if they continue or are bothersome): Confusion Drowsiness Dry mouth Hallucinations Headache This list may not describe all possible side effects. Call your doctor for medical advice about side effects. You may report side effects to FDA at 1-800-FDA-1088. Where should I keep my medication? Keep out of the reach of children and pets. Store at room temperature between 15 and 30 degrees C (59 and 86 degrees F). Keep container tightly closed. Throw away any unused medication after the expiration date. NOTE: This sheet is a summary. It may not cover all possible information. If you have questions about this medicine, talk to your doctor, pharmacist, or health care provider.  2023 Elsevier/Gold Standard (2020-06-20 00:00:00)  

## 2021-09-27 NOTE — Progress Notes (Signed)
Virtual Visit via Video Note  I connected with Regina Christensen on 09/27/21 at  8:30 AM EDT by a video enabled telemedicine application and verified that I am speaking with the correct person using two identifiers.  Location Provider Location : [facility] Patient Location : Home  Participants: Patient , Provider    I discussed the limitations of evaluation and management by telemedicine and the availability of in person appointments. The patient expressed understanding and agreed to proceed.   I discussed the assessment and treatment plan with the patient. The patient was provided an opportunity to ask questions and all were answered. The patient agreed with the plan and demonstrated an understanding of the instructions.   The patient was advised to call back or seek an in-person evaluation if the symptoms worsen or if the condition fails to improve as anticipated.  BH MD OP Progress Note  09/27/2021 9:35 AM Regina Christensen  MRN:  [id number]  Chief Complaint:  Chief Complaint  Patient presents with   Follow-up: 66 year old African-American female with history of GAD, MDD, insomnia, presented for medication management.   HPI: Regina Christensen is a 66 year old female, retired, lives in [place], has a history of GAD, MDD, hypertension, gastric bypass, hyperlipidemia, paroxysmal atrial fibrillation, OSA, arthritis, total arthroplasty of left knee, insomnia was evaluated by telemedicine today.  Patient today reports she is currently doing well with regards to her mood symptoms.  Her daughter-in-law underwent a surgery yesterday.  That did make her anxious.  She however has been coping okay.  Patient continues to have sleep problems.  She has no difficulty falling asleep.  She however reports she wakes up after 3 or 4 hours of sleep and stay up for an hour until she is able to fall back asleep.  Once she falls back asleep she is able to sleep until 7 in the morning.  She hence gets a total  of around 7 hours of sleep.  However it is interrupted.  Patient reports she could be in pain.  When she takes Tylenol right before bedtime it helps.  She also reports she has to urinate in the middle of the night which could be waking her up as well.  Agrees to work on her sleep hygiene techniques.  She does watch TV when she wakes up and is currently trying to work on that.  Did not want to take the Ambien since she was scared about all the side effects.  Patient denies any suicidality, homicidality or perceptual disturbances.  Patient denies any other concerns today.  Visit Diagnosis:    ICD-10-CM   1. GAD (generalized anxiety disorder)  F41.1 venlafaxine XR (EFFEXOR-XR) 75 MG 24 hr capsule    venlafaxine XR (EFFEXOR-XR) 37.5 MG 24 hr capsule    2. MDD (major depressive disorder), recurrent, in full remission (HCC)  F33.42 venlafaxine XR (EFFEXOR-XR) 75 MG 24 hr capsule    3. Primary insomnia  F51.01 hydrOXYzine (ATARAX) 25 MG tablet      Past Psychiatric History: Reviewed past psychiatric history from progress note on 01/27/2019.  Past trials of Prozac, Effexor, trazodone, Rozerem.  Past Medical History:  Past Medical History:  Diagnosis Date   Anxiety    Arthritis    Back pain    BPPV (benign paroxysmal positional vertigo)    Chronic diastolic HF (heart failure) (HCC)    Complication of anesthesia    Post-operative hypoxia requiring supplemental oxygen   Current use of long term anticoagulation  Apixaban   CVA (cerebral vascular accident) (HCC) 02/28/2020   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; imaging from [facility]   Depression    Edema of both lower extremities    GERD (gastroesophageal reflux disease)    Hypertension    IBS (irritable bowel syndrome)    Lactose intolerance    Morbid obesity with BMI of 45.0-49.9, adult (HCC)    Obesity    Osteoarthritis    PAF (paroxysmal atrial fibrillation) (HCC)    Pre-diabetes    Sleep apnea    uses CPAP machine sometimes     Type 2 diabetes mellitus without complication (HCC)    Vitamin D deficiency     Past Surgical History:  Procedure Laterality Date   ABDOMINAL HYSTERECTOMY     BREAST EXCISIONAL BIOPSY Left    ENDOMETRIAL ABLATION     x2   ENDOSCOPIC CONCHA BULLOSA RESECTION Bilateral 08/08/2020   Procedure: ENDOSCOPIC CONCHA BULLOSA RESECTION;  Surgeon: Vernie Murders, MD;  Location: [facility];  Service: ENT;  Laterality: Bilateral;   ETHMOIDECTOMY Right 08/08/2020   Procedure: ETHMOIDECTOMY, LEFT PARTIAL ETHMOIDECTOMY;  Surgeon: Vernie Murders, MD;  Location: [facility];  Service: ENT;  Laterality: Right;   IMAGE GUIDED SINUS SURGERY N/A 08/08/2020   Procedure: IMAGE GUIDED SINUS SURGERY, RIGHT FRONTAL SINUSOTOMY;  Surgeon: Vernie Murders, MD;  Location: [facility];  Service: ENT;  Laterality: N/A;   LAPAROSCOPIC SLEEVE GASTRECTOMY     MAXILLARY ANTROSTOMY Bilateral 08/08/2020   Procedure: MAXILLARY ANTROSTOMY with tissue;  Surgeon: Vernie Murders, MD;  Location: [facility];  Service: ENT;  Laterality: Bilateral;   NASAL TURBINATE REDUCTION  08/27/2020   Procedure: TURBINATE REDUCTION /SUBMUCOSAL DEBRIDEMENT;  Surgeon: Vernie Murders, MD;  Location: [facility];  Service: ENT;;   TOTAL KNEE ARTHROPLASTY Right 06/2019   TOTAL KNEE ARTHROPLASTY Left 01/31/2021   [facility]    Family Psychiatric History: Reviewed family psychiatric history from progress note on 01/27/2019.  Family History:  Family History  Problem Relation Age of Onset   Diabetes Mother    High blood pressure Mother    Heart disease Father    Cancer Brother    Mental illness Neg Hx     Social History: Reviewed social history from progress note on 01/27/2019. Social History   Socioeconomic History   Marital status: Divorced    Spouse name: Not on file   Number of children: 2   Years of education: Not on file   Highest education level: Associate degree: occupational, Scientist, product/process development, or vocational program  Occupational  History   Occupation: Retired/ work PT    Employer: [employer]  Tobacco Use   Smoking status: Never   Smokeless tobacco: Never  Vaping Use   Vaping Use: Never used  Substance and Sexual Activity   Alcohol use: No    Alcohol/week: 0.0 standard drinks of alcohol    Comment: rare   Drug use: No   Sexual activity: Not on file  Other Topics Concern   Not on file  Social History Narrative   Daughter lives with her    Stressed at work, needs to meet quotas on her first 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 )    Social Determinants of Health   Financial Resource Strain: Low Risk  (02/05/2018)   Overall Financial Resource Strain (CARDIA)    Difficulty of Paying Living Expenses: Not hard at all  Food Insecurity: No Food Insecurity (02/05/2018)   Hunger Vital Sign  Worried About Programme researcher, broadcasting/film/videounning Out of Food in the Last Year: Never true    Ran Out of Food in the Last Year: Never true  Transportation Needs: No Transportation Needs (02/05/2018)   PRAPARE - Administrator, Civil ServiceTransportation    Lack of Transportation (Medical): No    Lack of Transportation (Non-Medical): No  Physical Activity: Inactive (02/05/2018)   Exercise Vital Sign    Days of Exercise per Week: 0 days    Minutes of Exercise per Session: 0 min  Stress: Stress Concern Present (02/05/2018)   [employer] - Occupational Stress Questionnaire    Feeling of Stress : Rather much  Social Connections: Somewhat Isolated (02/05/2018)   Social Connection and Isolation Panel [NHANES]    Frequency of Communication with Friends and Family: More than three times a week    Frequency of Social Gatherings with Friends and Family: More than three times a week    Attends Religious Services: More than 4 times per year    Active Member of [employer] Financial[employer]ubs or Organizations: No    Attends BankerClub or Organization Meetings: Never    Marital Status: Divorced    Allergies:  Allergies  Allergen Reactions   Hydrocodone-Acetaminophen Other  (See Comments)    Extreme headache   Ace Inhibitors Cough   Hctz [Hydrochlorothiazide] Rash    face    Metabolic Disorder Labs: Lab Results  Component Value Date   HGBA1C 5.8 (H) 08/28/2021   MPG 125.5 08/16/2020   MPG 131.24 03/01/2020   No results found for: "PROLACTIN" Lab Results  Component Value Date   CHOL 130 03/13/2021   TRIG 77 03/13/2021   HDL 68 03/13/2021   CHOLHDL 1.9 03/13/2021   VLDL 14 03/01/2020   LDLCALC 46 03/13/2021   LDLCALC 49 03/01/2020   Lab Results  Component Value Date   TSH 1.250 11/14/2020   TSH 1.358 03/01/2020    Therapeutic Level Labs: No results found for: "LITHIUM" No results found for: "VALPROATE" No results found for: "CBMZ"  Current Medications: Current Outpatient Medications  Medication Sig Dispense Refill   acetaminophen (TYLENOL) 650 MG CR tablet Take 1 tablet (650 mg total) by mouth every 8 (eight) hours as needed for pain.     amLODipine (NORVASC) 5 MG tablet Take 1 tablet (5 mg total) by mouth daily. 30 tablet 3   apixaban (ELIQUIS) 5 MG TABS tablet Take 1 tablet (5 mg total) by mouth 2 (two) times daily. Please call the office to schedule an appointment. 180 tablet 0   atorvastatin (LIPITOR) 10 MG tablet Take 1 tablet (10 mg total) by mouth daily. 90 tablet 3   famotidine (PEPCID) 20 MG tablet Take 1 tablet (20 mg total) by mouth daily. 90 tablet 1   furosemide (LASIX) 20 MG tablet Take 1 tablet (20 mg total) by mouth daily as needed. 30 tablet 0   hydrOXYzine (ATARAX) 25 MG tablet Take 0.5-1 tablets (12.5-25 mg total) by mouth at bedtime as needed. For sleep 30 tablet 1   meclizine (ANTIVERT) 12.5 MG tablet Take 1 tablet (12.5 mg total) by mouth 3 (three) times daily as needed for dizziness. 30 tablet 0   metoprolol tartrate (LOPRESSOR) 100 MG tablet TAKE 1 TABLET BY MOUTH TWICE A DAY 180 tablet 1   neomycin-polymyxin b-dexamethasone (MAXITROL) 3.5-10000-0.1 SUSP SMARTSIG:1 In Eye(s) 3 Times Daily     olmesartan (BENICAR) 40  MG tablet Take 1 tablet (40 mg total) by mouth daily. 90 tablet 1   potassium chloride (KLOR-CON M) 10  MEQ tablet Take 2 tablets (20 mEq total) by mouth daily. Take with Lasix 60 tablet 0   Semaglutide, 2 MG/DOSE, (OZEMPIC, 2 MG/DOSE,) 8 MG/3ML SOPN Inject 2 mg into the skin once a week. 3 mL 0   tiZANidine (ZANAFLEX) 4 MG tablet      triamcinolone (NASACORT) 55 MCG/ACT AERO nasal inhaler Place 2 sprays into the nose daily.     Vitamin D, Ergocalciferol, (DRISDOL) 1.25 MG (50000 UNIT) CAPS capsule Take 1 capsule (50,000 Units total) by mouth every 7 (seven) days. 12 capsule 1   XIIDRA 5 % SOLN Place 1 drop into both eyes 2 (two) times daily.  4   venlafaxine XR (EFFEXOR-XR) 37.5 MG 24 hr capsule Take 1 capsule (37.5 mg total) by mouth daily with breakfast. Take along with 75 mg daily 90 capsule 1   venlafaxine XR (EFFEXOR-XR) 75 MG 24 hr capsule Take 1 capsule (75 mg total) by mouth daily with breakfast. Take along with 37.5 mg daily 90 capsule 1   No current facility-administered medications for this visit.     Musculoskeletal: Strength & Muscle Tone:  UTA Gait & Station: normal Patient leans: N/A  Psychiatric Specialty Exam: Review of Systems  Psychiatric/Behavioral:  Positive for sleep disturbance.   All other systems reviewed and are negative.   There were no vitals taken for this visit.There is no height or weight on file to calculate BMI.  General Appearance: Casual  Eye Contact:  Fair  Speech:  Clear and Coherent  Volume:  Normal  Mood:  Euthymic  Affect:  Congruent  Thought Process:  Goal Directed and Descriptions of Associations: Intact  Orientation:  Full (Time, Place, and Person)  Thought Content: Logical   Suicidal Thoughts:  No  Homicidal Thoughts:  No  Memory:  Immediate;   Fair Recent;   Fair Remote;   Fair  Judgement:  Fair  Insight:  Fair  Psychomotor Activity:  Normal  Concentration:  Concentration: Fair and Attention Span: Fair  Recall:  [employer] of  Knowledge: Fair  Language: Fair  Akathisia:  No  Handed:  Right  AIMS (if indicated): done  Assets:  Communication Skills Desire for Improvement Housing Social Support Transportation  ADL's:  Intact  Cognition: WNL  Sleep:   Restless at times    Screenings: AIMS    Flowsheet Row Video Visit from 09/27/2021 in [facility]  AIMS Total Score 0      GAD-7    Flowsheet Row Video Visit from 09/27/2021 in [facility] Video Visit from 02/13/2021 in [facility] Video Visit from 11/12/2020 in [facility] Video Visit from 09/04/2020 in [facility] Office Visit from 01/27/2019 in [facility]  Total GAD-7 Score 2 0 3 2 10       PHQ2-9    Flowsheet Row Video Visit from 09/27/2021 in [facility] Office Visit from 09/10/2021 in [facility] Video Visit from 07/16/2021 in [facility] Video Visit from 06/28/2021 in [facility] Video Visit from 04/08/2021 in [facility]  PHQ-2 Total Score 0 0 0 0 0  PHQ-9 Total Score -- 0 0 -- 0      Flowsheet Row Video Visit from 09/27/2021 in [facility] Video Visit from 06/28/2021 in [facility] Video Visit from 11/12/2020 in [facility]  C-SSRS RISK CATEGORY No Risk No Risk No Risk  Assessment and Plan: Regina Christensen is a 66 year old African-American female, divorced, retired, lives in [place], has a history of anxiety, depression, insomnia, multiple medical problems including paroxysmal atrial fibrillation was evaluated by telemedicine today.  Patient with sleep problems, will benefit from the following plan.  Plan GAD-stable Venlafaxine extended release 112.5 mg p.o.  daily  Daily in remission Venlafaxine extended release 112.5 mg p.o. daily  Insomnia- unstable Discontinue Ambien for noncompliance. Discussed sleep hygiene techniques.  Patient agrees to work on the same. Start hydroxyzine 12.5-25 mg p.o. nightly as needed.  Discussed to limit use.  Provided education about adverse side effects.   Follow-up in clinic in 3 months or sooner if needed.  Consent: Patient/Guardian gives verbal consent for treatment and assignment of benefits for services provided during this visit. Patient/Guardian expressed understanding and agreed to proceed.   This note was generated in part or whole with voice recognition software. Voice recognition is usually quite accurate but there are transcription errors that can and very often do occur. I apologize for any typographical errors that were not detected and corrected.      Jomarie Longs, MD 09/27/2021, 9:35 AM

## 2021-09-30 DIAGNOSIS — R600 Localized edema: Secondary | ICD-10-CM | POA: Insufficient documentation

## 2021-09-30 NOTE — Progress Notes (Addendum)
Chief Complaint:   OBESITY Regina Christensen is here to discuss her progress with her obesity treatment plan along with follow-up of her obesity related diagnoses. [name] is on the Category 1 Plan and states she is following her eating plan approximately 90% of the time. [name] states she is walking for 10 minutes 2-3 times per week.  Today's visit was #: 16 Starting weight: 255 lbs Starting date: 05/10/2020 Today's weight: 250 lbs Today's date: 09/25/2021 Total lbs lost to date: 5 Total lbs lost since last in-office visit: 1  Interim History:  GLP-1 therapy review: PCP initially started on Victoza from 07/08/2016 - 02/07/2017.   Victoza replaced with Ozempic 01/2017, has been on this GLP-1 since that time. She has a titrated up to Ozempic 2 mg, injects on Sundays.    09/10/21 PCP office visit, no change to medications, follow-up 6 months.  Subjective:   1. Vitamin D deficiency 08/28/2021 vitamin D level was 38.1- below goal 50-70. I discussed labs with the patient today.  2. Type 2 diabetes mellitus with other specified complication, without long-term current use of insulin (HCC) PCP initially started on Victoza from 07/08/2016 - 02/07/2017.   Victoza replaced with Ozempic 01/2017, has been on this GLP-1 since that time. She has a titrated up to Ozempic 2 mg, injects on Sundays.   She denies mass in neck, dysphagia, dyspepsia, persistent hoarseness, abd pain, or N/V/Constipation. 08/28/2021 A1c was 5.8, at goal.   I discussed labs with the patient today.  3. Bilateral lower extremity edema 09/24/2021-Dr. Agbor-Etang/Cardiology- decreased amlodipine from 10 mg to 5 mg.   She was continued on  Lopressor 100 mg daily, Benicar 40 mg, and Lasix 20 mg PRN- she estiamtes to use3 times per week. I discussed labs with the patient today.  Assessment/Plan:   1. Vitamin D deficiency We will refill prescription vitamin D 50,000 units once weekly for 90 days with 1 refill.  - Vitamin D,  Ergocalciferol, (DRISDOL) 1.25 MG (50000 UNIT) CAPS capsule; Take 1 capsule (50,000 Units total) by mouth every 7 (seven) days.  Dispense: 12 capsule; Refill: 1  2. Type 2 diabetes mellitus with other specified complication, without long-term current use of insulin (HCC) We will refill Ozempic 2 mg once weekly for 1 month.  - Semaglutide, 2 MG/DOSE, (OZEMPIC, 2 MG/DOSE,) 8 MG/3ML SOPN; Inject 2 mg into the skin once a week.  Dispense: 3 mL; Refill: 0  3. Bilateral lower extremity edema Continue current antihypertensive therapy.  Elevate legs when able, compression socks.  Monitor blood pressure at home.  4. Obesity, current BMI 40.4 Regina Christensen is currently in the action stage of change. As such, her goal is to continue with weight loss efforts. She has agreed to the Category 1 Plan.   Exercise goals: As is, increase daily walking.  Behavioral modification strategies: increasing lean protein intake, decreasing simple carbohydrates, meal planning and cooking strategies, keeping healthy foods in the home, and planning for success.  [name] has agreed to follow-up with our clinic in 3 weeks. She was informed of the importance of frequent follow-up visits to maximize her success with intensive lifestyle modifications for her multiple health conditions.   Objective:   Blood pressure 118/75, pulse 74, temperature 98.1 F (36.7 C), height 5\' 8"  (1.727 m), weight 250 lb (113.4 kg), SpO2 98 %. Body mass index is 38.01 kg/m.  General: Cooperative, alert, well developed, in no acute distress. HEENT: Conjunctivae and lids unremarkable. Cardiovascular: Regular rhythm.  Lungs: Normal work of  breathing. Neurologic: No focal deficits.   Lab Results  Component Value Date   CREATININE 0.70 08/28/2021   BUN 10 08/28/2021   NA 145 (H) 08/28/2021   K 3.6 08/28/2021   CL 106 08/28/2021   CO2 26 08/28/2021   Lab Results  Component Value Date   ALT 12 08/28/2021   AST 15 08/28/2021   ALKPHOS 100  08/28/2021   BILITOT 0.3 08/28/2021   Lab Results  Component Value Date   HGBA1C 5.8 (H) 08/28/2021   HGBA1C 5.6 03/13/2021   HGBA1C 6.0 (H) 08/16/2020   HGBA1C 6.2 (H) 03/01/2020   HGBA1C 6.3 (H) 11/29/2019   Lab Results  Component Value Date   INSULIN 8.0 08/28/2021   Lab Results  Component Value Date   TSH 1.250 11/14/2020   Lab Results  Component Value Date   CHOL 130 03/13/2021   HDL 68 03/13/2021   LDLCALC 46 03/13/2021   TRIG 77 03/13/2021   CHOLHDL 1.9 03/13/2021   Lab Results  Component Value Date   VD25OH 38.1 08/28/2021   VD25OH 37.3 11/14/2020   Lab Results  Component Value Date   WBC 7.9 03/13/2021   HGB 12.4 03/13/2021   HCT 38.8 03/13/2021   MCV 87.2 03/13/2021   PLT 382 03/13/2021   Lab Results  Component Value Date   IRON 39 11/14/2020   TIBC 329 11/14/2020   FERRITIN 25 11/14/2020    Obesity Behavioral Intervention:   Approximately 15 minutes were spent on the discussion below.  ASK: We discussed the diagnosis of obesity with Regina Christensen today and Regina Christensen agreed to give [place] permission to discuss obesity behavioral modification therapy today.  ASSESS: Ainslie has the diagnosis of obesity and her BMI today is 40.4. Ismael is in the action stage of change.   ADVISE: Regina Christensen was educated on the multiple health risks of obesity as well as the benefit of weight loss to improve her health. She was advised of the need for long term treatment and the importance of lifestyle modifications to improve her current health and to decrease her risk of future health problems.  AGREE: Multiple dietary modification options and treatment options were discussed and Regina Christensen agreed to follow the recommendations documented in the above note.  ARRANGE: Regina Christensen was educated on the importance of frequent visits to treat obesity as outlined per CMS and USPSTF guidelines and agreed to schedule her next follow up appointment today.  Attestation  Statements:   Reviewed by clinician on day of visit: allergies, medications, problem list, medical history, surgical history, family history, social history, and previous encounter notes.   Trude Mcburney, am acting as transcriptionist for William Hamburger, NP.  I have reviewed the above documentation for accuracy and completeness, and I agree with the above. -  Gwenivere Hiraldo d. Amoria Mclees, NP-C

## 2021-10-11 ENCOUNTER — Other Ambulatory Visit (INDEPENDENT_AMBULATORY_CARE_PROVIDER_SITE_OTHER): Payer: Self-pay | Admitting: Adult Health

## 2021-10-11 DIAGNOSIS — E1169 Type 2 diabetes mellitus with other specified complication: Secondary | ICD-10-CM

## 2021-10-17 ENCOUNTER — Ambulatory Visit (INDEPENDENT_AMBULATORY_CARE_PROVIDER_SITE_OTHER): Payer: Medicare HMO | Admitting: Adult Health

## 2021-10-18 ENCOUNTER — Encounter: Payer: Self-pay | Admitting: Family Medicine

## 2021-10-18 ENCOUNTER — Ambulatory Visit (INDEPENDENT_AMBULATORY_CARE_PROVIDER_SITE_OTHER): Payer: Medicare HMO | Admitting: Family Medicine

## 2021-10-18 VITALS — BP 118/72 | HR 82 | Resp 16 | Ht 68.0 in | Wt 254.0 lb

## 2021-10-18 DIAGNOSIS — Z0000 Encounter for general adult medical examination without abnormal findings: Secondary | ICD-10-CM | POA: Diagnosis not present

## 2021-10-18 NOTE — Progress Notes (Signed)
Patient: Regina Christensen, Female    DOB: [birth year], 66 y.o.   MRN: [id number]  Visit Date: 10/18/2021  Today's Provider: Loistine Chance, MD   Welcome to Medicare  Subjective:    HPI Regina Christensen is a 66 y.o. female who presents today for her Subsequent Annual Wellness Visit.  Patient/Caregiver input:    Review of Systems  Constitutional: Negative for fever , positive for weight change.  Respiratory: Negative for cough and shortness of breath.   Cardiovascular: Negative for chest pain or palpitations.  Gastrointestinal: Negative for abdominal pain, no bowel changes.  Musculoskeletal: Negative for gait problem or joint swelling.  Skin: Negative for rash.  Neurological: Negative for dizziness or headache.  No other specific complaints in a complete review of systems (except as listed in HPI above).  Past Medical History:  Diagnosis Date   Anxiety    Arthritis    Back pain    BPPV (benign paroxysmal positional vertigo)    Chronic diastolic HF (heart failure) (HCC)    Complication of anesthesia    Post-operative hypoxia requiring supplemental oxygen   Current use of long term anticoagulation    Apixaban   CVA (cerebral vascular accident) ([place]) 02/28/2020   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; imaging from [facility]   Depression    Edema of both lower extremities    GERD (gastroesophageal reflux disease)    Hypertension    IBS (irritable bowel syndrome)    Lactose intolerance    Morbid obesity with BMI of 45.0-49.9, adult (HCC)    Obesity    Osteoarthritis    PAF (paroxysmal atrial fibrillation) ([place])    Pre-diabetes    Sleep apnea    uses CPAP machine sometimes    Type 2 diabetes mellitus without complication ([place])    Vitamin D deficiency     Past Surgical History:  Procedure Laterality Date   ABDOMINAL HYSTERECTOMY     BREAST EXCISIONAL BIOPSY Left    ENDOMETRIAL ABLATION     x2   ENDOSCOPIC CONCHA BULLOSA RESECTION Bilateral 08/08/2020    Procedure: ENDOSCOPIC CONCHA BULLOSA RESECTION;  Surgeon: Margaretha Sheffield, MD;  Location: [facility] ORS;  Service: ENT;  Laterality: Bilateral;   ETHMOIDECTOMY Right 08/08/2020   Procedure: ETHMOIDECTOMY, LEFT PARTIAL ETHMOIDECTOMY;  Surgeon: Margaretha Sheffield, MD;  Location: [facility] OR[facility];  Service: ENT;  Laterality: Right;   IMAGE GUIDED SINUS SURGERY N/A 08/08/2020   Procedure: IMAGE GUIDED SINUS SURGERY, RIGHT FRONTAL SINUSOTOMY;  Surgeon: Margaretha Sheffield, MD;  Location: [facility] OR[facility];  Service: ENT;  Laterality: N/A;   LAPAROSCOPIC SLEEVE GASTRECTOMY     MAXILLARY ANTROSTOMY Bilateral 08/08/2020   Procedure: MAXILLARY ANTROSTOMY with tissue;  Surgeon: Margaretha Sheffield, MD;  Location: [facility] OR[facility];  Service: ENT;  Laterality: Bilateral;   NASAL TURBINATE REDUCTION  08/27/2020   Procedure: TURBINATE REDUCTION /SUBMUCOSAL DEBRIDEMENT;  Surgeon: Margaretha Sheffield, MD;  Location: [facility] OR[facility];  Service: ENT;;   TOTAL KNEE ARTHROPLASTY Right 06/2019   TOTAL KNEE ARTHROPLASTY Left 01/31/2021   [facility]    Family History  Problem Relation Age of Onset   Diabetes Mother    High blood pressure Mother    Heart disease Father    Cancer Brother    Mental illness Neg Hx     Social History   Socioeconomic History   Marital status: Divorced    Spouse name: Not on file   Number of children: 2   Years of education: Not on file   Highest  education level: Associate degree: occupational, Hotel manager, or vocational program  Occupational History   Occupation: Retired/ work PT    Employer: [employer]  Tobacco Use   Smoking status: Never   Smokeless tobacco: Never  Vaping Use   Vaping Use: Never used  Substance and Sexual Activity   Alcohol use: No    Alcohol/week: 0.0 standard drinks of alcohol    Comment: rare   Drug use: No   Sexual activity: Not on file  Other Topics Concern   Not on file  Social History Narrative   Not on file   Social Determinants of Health   Financial Resource Strain: Low Risk   (10/18/2021)   Overall Financial Resource Strain (CARDIA)    Difficulty of Paying Living Expenses: Not hard at all  Food Insecurity: No Food Insecurity (10/18/2021)   Hunger Vital Sign    Worried About Running Out of Food in the Last Year: Never true    [place] in the Last Year: Never true  Transportation Needs: No Transportation Needs (10/18/2021)   PRAPARE - Hydrologist (Medical): No    Lack of Transportation (Non-Medical): No  Physical Activity: Insufficiently Active (10/18/2021)   Exercise Vital Sign    Days of Exercise per Week: 3 days    Minutes of Exercise per Session: 10 min  Stress: No Stress Concern Present (10/18/2021)   [place]    Feeling of Stress : Only a little  Social Connections: Moderately Integrated (10/18/2021)   Social Connection and Isolation Panel [NHANES]    Frequency of Communication with Friends and Family: More than three times a week    Frequency of Social Gatherings with Friends and Family: More than three times a week    Attends Religious Services: More than 4 times per year    Active Member of [employer] or Organizations: Yes    Attends Archivist Meetings: Never    Marital Status: Divorced  Human resources officer Violence: Not At Risk (10/18/2021)   Humiliation, Afraid, Rape, and Kick questionnaire    Fear of Current or Ex-Partner: No    Emotionally Abused: No    Physically Abused: No    Sexually Abused: No    Outpatient Encounter Medications as of 10/18/2021  Medication Sig Note   acetaminophen (TYLENOL) 650 MG CR tablet Take 1 tablet (650 mg total) by mouth every 8 (eight) hours as needed for pain.    amLODipine (NORVASC) 5 MG tablet Take 1 tablet (5 mg total) by mouth daily.    apixaban (ELIQUIS) 5 MG TABS tablet Take 1 tablet (5 mg total) by mouth 2 (two) times daily. Please call the office to schedule an appointment.    atorvastatin (LIPITOR) 10  MG tablet Take 1 tablet (10 mg total) by mouth daily.    famotidine (PEPCID) 20 MG tablet Take 1 tablet (20 mg total) by mouth daily.    furosemide (LASIX) 20 MG tablet Take 1 tablet (20 mg total) by mouth daily as needed.    hydrOXYzine (ATARAX) 25 MG tablet Take 0.5-1 tablets (12.5-25 mg total) by mouth at bedtime as needed. For sleep    meclizine (ANTIVERT) 12.5 MG tablet Take 1 tablet (12.5 mg total) by mouth 3 (three) times daily as needed for dizziness. 07/16/2021: PRN   metoprolol tartrate (LOPRESSOR) 100 MG tablet TAKE 1 TABLET BY MOUTH TWICE A DAY    olmesartan (BENICAR) 40 MG tablet Take 1 tablet (  40 mg total) by mouth daily.    potassium chloride (KLOR-CON M) 10 MEQ tablet Take 2 tablets (20 mEq total) by mouth daily. Take with Lasix    Semaglutide, 2 MG/DOSE, (OZEMPIC, 2 MG/DOSE,) 8 MG/3ML SOPN Inject 2 mg into the skin once a week.    tiZANidine (ZANAFLEX) 4 MG tablet     triamcinolone (NASACORT) 55 MCG/ACT AERO nasal inhaler Place 2 sprays into the nose daily.    venlafaxine XR (EFFEXOR-XR) 37.5 MG 24 hr capsule Take 1 capsule (37.5 mg total) by mouth daily with breakfast. Take along with 75 mg daily    venlafaxine XR (EFFEXOR-XR) 75 MG 24 hr capsule Take 1 capsule (75 mg total) by mouth daily with breakfast. Take along with 37.5 mg daily    Vitamin D, Ergocalciferol, (DRISDOL) 1.25 MG (50000 UNIT) CAPS capsule Take 1 capsule (50,000 Units total) by mouth every 7 (seven) days.    XIIDRA 5 % SOLN Place 1 drop into both eyes 2 (two) times daily. 07/16/2021: PRN   [DISCONTINUED] neomycin-polymyxin b-dexamethasone (MAXITROL) 3.5-10000-0.1 SUSP SMARTSIG:1 In Eye(s) 3 Times Daily (Patient not taking: Reported on 10/18/2021)    No facility-administered encounter medications on file as of 10/18/2021.    Allergies  Allergen Reactions   Hydrocodone-Acetaminophen Other (See Comments)    Extreme headache   Ace Inhibitors Cough   Hctz [Hydrochlorothiazide] Rash    face    Care Team Updated  in EHR: Yes   Last Vision Exam: 03/2020 Wears corrective lenses: Yes only for reading/computer - [facility]  Last Dental Exam: 06/2021 -[facility]  Last Hearing Exam: 07/2021 Wears Hearing Aids: No , seen by ENT for tinnitus left ear   Functional Ability / Safety Screening 1.  Was the timed Get Up and Go test shorter than 30 seconds?  yes 2.  Does the patient need help with the phone, transportation, shopping,      preparing meals, housework, laundry, medications, or managing money?  no 3.  Is the patient's home free of loose throw rugs in walkways, pet beds, electrical cords, etc?   yes      Grab bars in the bathroom? no      Handrails on the stairs? N/A      Adequate lighting?   yes 4.  Has the patient noticed any hearing difficulties?   yes-  L ear  Diet Recall and Exercise Regimen: needs to increase physical activity - reviewed diet   Advanced Care Planning: A voluntary discussion about advance care planning including the explanation and discussion of advance directives.  Discussed health care proxy and Living will, and the patient was able to identify a health care proxy as daughter.  Patient does not have a living will at present time. If patient does have living will, I have requested they bring this to the clinic to be scanned in to their chart. Does patient have a HCPOA?    no If yes, name and contact information: N/A Does patient have a living will or MOST form?  no  Cancer Screenings: Skin: no atypical lesions  Lung: Low Dose CT Chest recommended if Age 26-80 years, 30 pack-year currently smoking OR have quit w/in 15years. Patient does not qualify. Breast: Up to date on Mammogram? Yes  Up to date of Bone Density/Dexa? No Colon: 11/30/15  Additional Screenings:  Hepatitis B/HIV/Syphillis: 03/01/20 Hepatitis C Screening: 11/19/12 Intimate Partner Violence: negative  Objective:   Vitals: BP 118/72   Pulse 82   Resp 16  Ht '5\' 8"'  (1.727 m)   Wt 254 lb (115.2 kg)    SpO2 98%   BMI 38.62 kg/m  Body mass index is 38.62 kg/m.  Hearing Screening   '500Hz'  '1000Hz'  '2000Hz'  '4000Hz'   Right ear Pass Pass Pass Pass  Left ear Pass Pass Pass Pass   Vision Screening   Right eye Left eye Both eyes  Without correction '20/40 20/25 20/25 '  With correction       Physical Exam Constitutional: Patient appears well-developed and well-nourished. Obese  No distress.  HEENT: head atraumatic, normocephalic, pupils equal and reactive to light, ear normal TM, neck supple, throat within normal limits Cardiovascular: Normal rate, regular rhythm and normal heart sounds.  No murmur heard. 1 plus  BLE edema. Pulmonary/Chest: Effort normal and breath sounds normal. No respiratory distress. Abdominal: Soft.  There is no tenderness. Muscular skeletal : some soreness during palpation of lumbar spine  Psychiatric: Patient has a normal mood and affect. behavior is normal. Judgment and thought content normal.  Diabetic Foot Exam - Simple   Simple Foot Form Diabetic Foot exam was performed with the following findings: Yes 10/18/2021  8:47 AM  Visual Inspection No deformities, no ulcerations, no other skin breakdown bilaterally: Yes Sensation Testing Intact to touch and monofilament testing bilaterally: Yes Pulse Check Posterior Tibialis and Dorsalis pulse intact bilaterally: Yes Comments      Cognitive Testing - 6-CIT  Correct? Score   What year is it? yes 0 Yes = 0    No = 4  What month is it? yes 0 Yes = 0    No = 3  Remember:     [address]ia [address], [place], [place]     What time is it? yes 0 Yes = 0    No = 3  Count backwards from 20 to 1 yes 0 Correct = 0    1 error = 2   More than 1 error = 4  Say the months of the year in reverse. yes 0 Correct = 0    1 error = 2   More than 1 error = 4  What address did I ask you to remember? yes 0 Correct = 0  1 error = 2    2 error = 4    3 error = 6    4 error = 8    All wrong = 10       TOTAL SCORE  0/28   Interpretation:   Normal  Normal (0-7) Abnormal (8-28)   Fall Risk:    10/18/2021    8:04 AM 09/10/2021    7:44 AM 07/16/2021    7:47 AM 04/08/2021    2:34 PM 03/13/2021    7:32 AM  Fall Risk   Falls in the past year? 0 0 0 0 0  Number falls in past yr: 0 0 0 0 0  Injury with Fall? 0 0 0 0 0  Risk for fall due to : No Fall Risks No Fall Risks  No Fall Risks No Fall Risks  Follow up Falls prevention discussed Falls prevention discussed  Falls prevention discussed Falls prevention discussed    Depression Screen    10/18/2021    8:04 AM 09/27/2021    8:41 AM 09/10/2021    7:44 AM 07/16/2021    7:47 AM 06/28/2021    8:45 AM  Depression screen PHQ 2/9  Decreased Interest 0  0 0   Down, Depressed, Hopeless 0  0 0  PHQ - 2 Score 0  0 0   Altered sleeping 0  0 0   Tired, decreased energy 0  0 0   Change in appetite 0  0 0   Feeling bad or failure about yourself  0  0 0   Trouble concentrating 0  0 0   Moving slowly or fidgety/restless 0  0 0   Suicidal thoughts 0  0 0   PHQ-9 Score 0  0 0   Difficult doing work/chores    Not difficult at all      Information is confidential and restricted. Go to Review Flowsheets to unlock data.    Recent Results (from the past 2160 hour(s))  Hemoglobin A1c     Status: Abnormal   Collection Time: 08/28/21  9:04 AM  Result Value Ref Range   Hgb A1c MFr Bld 5.8 (H) 4.8 - 5.6 %    Comment:          Prediabetes: 5.7 - 6.4          Diabetes: >6.4          Glycemic control for adults with diabetes: <7.0    Est. average glucose Bld gHb Est-mCnc 120 mg/dL  Insulin, random     Status: None   Collection Time: 08/28/21  9:04 AM  Result Value Ref Range   INSULIN 8.0 2.6 - 24.9 uIU/mL  VITAMIN D 25 Hydroxy (Vit-D Deficiency, Fractures)     Status: None   Collection Time: 08/28/21  9:04 AM  Result Value Ref Range   Vit D, 25-Hydroxy 38.1 30.0 - 100.0 ng/mL    Comment: Vitamin D deficiency has been defined by the [place] and an Endocrine Society practice  guideline as a level of serum 25-OH vitamin D less than 20 ng/mL (1,2). The Endocrine Society went on to further define vitamin D insufficiency as a level between 21 and 29 ng/mL (2). 1. IOM (Institute of Medicine). 2010. Dietary reference    intakes for calcium and D. [place]: The    [employer]. 2. Holick MF, Binkley [place], Bischoff-Ferrari HA, et al.    Evaluation, treatment, and prevention of vitamin D    deficiency: an Endocrine Society clinical practice    guideline. JCEM. 2011 Jul; 96(7):1911-30.   Comprehensive metabolic panel     Status: Abnormal   Collection Time: 08/28/21  9:04 AM  Result Value Ref Range   Glucose 88 70 - 99 mg/dL   BUN 10 8 - 27 mg/dL   Creatinine, Ser 0.70 0.57 - 1.00 mg/dL   eGFR 96 >59 mL/min/1.73   BUN/Creatinine Ratio 14 12 - 28   Sodium 145 (H) 134 - 144 mmol/L   Potassium 3.6 3.5 - 5.2 mmol/L   Chloride 106 96 - 106 mmol/L   CO2 26 20 - 29 mmol/L   Calcium 9.3 8.7 - 10.3 mg/dL   Total Protein 6.9 6.0 - 8.5 g/dL   Albumin 4.0 3.8 - 4.8 g/dL   Globulin, Total 2.9 1.5 - 4.5 g/dL   Albumin/Globulin Ratio 1.4 1.2 - 2.2   Bilirubin Total 0.3 0.0 - 1.2 mg/dL   Alkaline Phosphatase 100 44 - 121 IU/L   AST 15 0 - 40 IU/L   ALT 12 0 - 32 IU/L    Assessment & Plan:    There are no diagnoses linked to this encounter. Welcome to [facility]   Exercise Activities and Dietary recommendations  Join silver sneakers and increase physical activity   -  Discussed health benefits of physical activity, and encouraged her to engage in regular exercise appropriate for her age and condition.   Immunization History  Administered Date(s) Administered   Influenza, Quadrivalent, Recombinant, Inj, Pf 01/19/2018   Influenza,inj,Quad PF,6+ Mos 01/11/2016, 12/24/2018, 03/05/2020, 03/13/2021   Influenza-Unspecified 01/30/2017, 01/19/2018   PFIZER(Purple Top)SARS-COV-2 Vaccination 07/17/2019, 08/10/2019, 03/17/2020   PNEUMOCOCCAL CONJUGATE-20 09/10/2021    Pneumococcal Conjugate-13 04/05/2015   Tdap 03/13/2021   Zoster Recombinat (Shingrix) 03/12/2018, 08/17/2018    Health Maintenance  Topic Date Due   OPHTHALMOLOGY EXAM  Never done   COVID-19 Vaccine (4 - Pfizer series) 05/12/2020   DEXA SCAN  Never done   INFLUENZA VACCINE  11/19/2021   HEMOGLOBIN A1C  02/28/2022   FOOT EXAM  10/19/2022   MAMMOGRAM  03/30/2023   COLONOSCOPY (Pts 45-64yr Insurance coverage will need to be confirmed)  11/29/2025   TETANUS/TDAP  03/14/2031   Pneumonia Vaccine 65 Years old  Completed   Hepatitis C Screening  Completed   HIV Screening  Completed   Zoster Vaccines- Shingrix  Completed   HPV VACCINES  Aged Out    No orders of the defined types were placed in this encounter.   Current Outpatient Medications:    acetaminophen (TYLENOL) 650 MG CR tablet, Take 1 tablet (650 mg total) by mouth every 8 (eight) hours as needed for pain., Disp: , Rfl:    amLODipine (NORVASC) 5 MG tablet, Take 1 tablet (5 mg total) by mouth daily., Disp: 30 tablet, Rfl: 3   apixaban (ELIQUIS) 5 MG TABS tablet, Take 1 tablet (5 mg total) by mouth 2 (two) times daily. Please call the office to schedule an appointment., Disp: 180 tablet, Rfl: 0   atorvastatin (LIPITOR) 10 MG tablet, Take 1 tablet (10 mg total) by mouth daily., Disp: 90 tablet, Rfl: 3   famotidine (PEPCID) 20 MG tablet, Take 1 tablet (20 mg total) by mouth daily., Disp: 90 tablet, Rfl: 1   furosemide (LASIX) 20 MG tablet, Take 1 tablet (20 mg total) by mouth daily as needed., Disp: 30 tablet, Rfl: 0   hydrOXYzine (ATARAX) 25 MG tablet, Take 0.5-1 tablets (12.5-25 mg total) by mouth at bedtime as needed. For sleep, Disp: 30 tablet, Rfl: 1   meclizine (ANTIVERT) 12.5 MG tablet, Take 1 tablet (12.5 mg total) by mouth 3 (three) times daily as needed for dizziness., Disp: 30 tablet, Rfl: 0   metoprolol tartrate (LOPRESSOR) 100 MG tablet, TAKE 1 TABLET BY MOUTH TWICE A DAY, Disp: 180 tablet, Rfl: 1   olmesartan  (BENICAR) 40 MG tablet, Take 1 tablet (40 mg total) by mouth daily., Disp: 90 tablet, Rfl: 1   potassium chloride (KLOR-CON M) 10 MEQ tablet, Take 2 tablets (20 mEq total) by mouth daily. Take with Lasix, Disp: 60 tablet, Rfl: 0   Semaglutide, 2 MG/DOSE, (OZEMPIC, 2 MG/DOSE,) 8 MG/3ML SOPN, Inject 2 mg into the skin once a week., Disp: 3 mL, Rfl: 0   tiZANidine (ZANAFLEX) 4 MG tablet, , Disp: , Rfl:    triamcinolone (NASACORT) 55 MCG/ACT AERO nasal inhaler, Place 2 sprays into the nose daily., Disp: , Rfl:    venlafaxine XR (EFFEXOR-XR) 37.5 MG 24 hr capsule, Take 1 capsule (37.5 mg total) by mouth daily with breakfast. Take along with 75 mg daily, Disp: 90 capsule, Rfl: 1   venlafaxine XR (EFFEXOR-XR) 75 MG 24 hr capsule, Take 1 capsule (75 mg total) by mouth daily with breakfast. Take along with 37.5 mg daily, Disp: 90 capsule, Rfl:  1   Vitamin D, Ergocalciferol, (DRISDOL) 1.25 MG (50000 UNIT) CAPS capsule, Take 1 capsule (50,000 Units total) by mouth every 7 (seven) days., Disp: 12 capsule, Rfl: 1   XIIDRA 5 % SOLN, Place 1 drop into both eyes 2 (two) times daily., Disp: , Rfl: 4 Medications Discontinued During This Encounter  Medication Reason   neomycin-polymyxin b-dexamethasone (MAXITROL) 3.5-10000-0.1 SUSP Completed Course    I have personally reviewed and addressed the Medicare Annual Wellness health risk assessment questionnaire and have noted the following in the patient's chart:  A.         Medical and social history & family history B.         Use of alcohol, tobacco, and illicit drugs  C.         Current medications and supplements D.         Functional and Cognitive ability and status E.         Nutritional status F.         Physical activity G.        Advance directives H.         List of other physicians I.          Hospitalizations, surgeries, and ER visits in previous 12 months J.         [place] such as hearing, vision, cognitive function, and  depression L.         Referrals and appointments: bone density   In addition, I have reviewed and discussed with patient certain preventive protocols, quality metrics, and best practice recommendations. A written personalized care plan for preventive services as well as general preventive health recommendations were provided to patient.   See attached scanned questionnaire for additional information.   Return if symptoms worsen or fail to improve, for as scheduled. refresh to show

## 2021-10-18 NOTE — Patient Instructions (Signed)
  Regina Christensen , Thank you for taking time to come for your Medicare Wellness Visit. I appreciate your ongoing commitment to your health goals. Please review the following plan we discussed and let me know if I can assist you in the future.   These are the goals we discussed:  Join silver sneakers and increase physical activity Get bone density done    This is a list of the screening recommended for you and due dates:  Health Maintenance  Topic Date Due   Complete foot exam   Never done   Eye exam for diabetics  Never done   COVID-19 Vaccine (4 - Pfizer series) 05/12/2020   DEXA scan (bone density measurement)  Never done   Flu Shot  11/19/2021   Hemoglobin A1C  02/28/2022   Mammogram  03/30/2023   Colon Cancer Screening  11/29/2025   Tetanus Vaccine  03/14/2031   Pneumonia Vaccine  Completed   Hepatitis C Screening: USPSTF Recommendation to screen - Ages 18-79 yo.  Completed   HIV Screening  Completed   Zoster (Shingles) Vaccine  Completed   HPV Vaccine  Aged Out

## 2021-10-21 ENCOUNTER — Other Ambulatory Visit: Payer: Self-pay | Admitting: Psychiatry

## 2021-10-21 DIAGNOSIS — F5101 Primary insomnia: Secondary | ICD-10-CM

## 2021-10-25 ENCOUNTER — Other Ambulatory Visit (INDEPENDENT_AMBULATORY_CARE_PROVIDER_SITE_OTHER): Payer: Self-pay | Admitting: Adult Health

## 2021-10-25 DIAGNOSIS — E1169 Type 2 diabetes mellitus with other specified complication: Secondary | ICD-10-CM

## 2021-10-28 ENCOUNTER — Ambulatory Visit
Admission: RE | Admit: 2021-10-28 | Discharge: 2021-10-28 | Disposition: A | Discharge status: Home / Self Care | Payer: Medicare HMO | Source: Ambulatory Visit | Attending: Family Medicine | Admitting: Family Medicine

## 2021-10-28 DIAGNOSIS — M85851 Other specified disorders of bone density and structure, right thigh: Secondary | ICD-10-CM | POA: Diagnosis not present

## 2021-10-28 DIAGNOSIS — Z780 Asymptomatic menopausal state: Secondary | ICD-10-CM | POA: Diagnosis not present

## 2021-10-28 DIAGNOSIS — Z0000 Encounter for general adult medical examination without abnormal findings: Secondary | ICD-10-CM | POA: Insufficient documentation

## 2021-10-28 DIAGNOSIS — Z13820 Encounter for screening for osteoporosis: Secondary | ICD-10-CM | POA: Diagnosis not present

## 2021-10-31 ENCOUNTER — Encounter: Payer: Self-pay | Admitting: Family Medicine

## 2021-11-01 DIAGNOSIS — Z96651 Presence of right artificial knee joint: Secondary | ICD-10-CM | POA: Diagnosis not present

## 2021-11-01 DIAGNOSIS — M25512 Pain in left shoulder: Secondary | ICD-10-CM | POA: Diagnosis not present

## 2021-11-01 DIAGNOSIS — Z96652 Presence of left artificial knee joint: Secondary | ICD-10-CM | POA: Diagnosis not present

## 2021-11-01 DIAGNOSIS — M545 Low back pain, unspecified: Secondary | ICD-10-CM | POA: Diagnosis not present

## 2021-11-11 ENCOUNTER — Encounter: Payer: Self-pay | Admitting: Family Medicine

## 2021-11-11 ENCOUNTER — Other Ambulatory Visit (INDEPENDENT_AMBULATORY_CARE_PROVIDER_SITE_OTHER): Payer: Self-pay | Admitting: Adult Health

## 2021-11-11 DIAGNOSIS — E1169 Type 2 diabetes mellitus with other specified complication: Secondary | ICD-10-CM

## 2021-11-14 ENCOUNTER — Encounter (INDEPENDENT_AMBULATORY_CARE_PROVIDER_SITE_OTHER): Payer: Self-pay | Admitting: Adult Health

## 2021-11-14 ENCOUNTER — Ambulatory Visit (INDEPENDENT_AMBULATORY_CARE_PROVIDER_SITE_OTHER): Payer: Medicare HMO | Admitting: Adult Health

## 2021-11-14 VITALS — BP 114/76 | HR 77 | Temp 98.3°F | Ht 68.0 in | Wt 252.0 lb

## 2021-11-14 DIAGNOSIS — E1169 Type 2 diabetes mellitus with other specified complication: Secondary | ICD-10-CM

## 2021-11-14 DIAGNOSIS — E669 Obesity, unspecified: Secondary | ICD-10-CM

## 2021-11-14 DIAGNOSIS — E559 Vitamin D deficiency, unspecified: Secondary | ICD-10-CM

## 2021-11-14 DIAGNOSIS — Z6838 Body mass index (BMI) 38.0-38.9, adult: Secondary | ICD-10-CM | POA: Diagnosis not present

## 2021-11-14 DIAGNOSIS — Z7985 Long-term (current) use of injectable non-insulin antidiabetic drugs: Secondary | ICD-10-CM

## 2021-11-14 MED ORDER — VITAMIN D (ERGOCALCIFEROL) 1.25 MG (50000 UNIT) PO CAPS: 50000.0000 [IU] | ORAL_CAPSULE | ORAL | 1 refills | Status: DC

## 2021-11-14 MED ORDER — OZEMPIC (2 MG/DOSE) 8 MG/3ML ~~LOC~~ SOPN: 2.0000 mg | PEN_INJECTOR | SUBCUTANEOUS | 0 refills | Status: DC

## 2021-11-20 NOTE — Progress Notes (Signed)
Chief Complaint:   OBESITY Regina Christensen is here to discuss her progress with her obesity treatment plan along with follow-up of her obesity related diagnoses. Regina Christensen is on the Category 1 Plan and states she is following her eating plan approximately 80% of the time. Regina Christensen states she is doing gym exercise for 60 minutes 3-4 times per week.  Today's visit was #: 17 Starting weight: 255 lbs Starting date: 05/10/2020 Today's weight: 252 lbs Today's date: 11/14/2021 Total lbs lost to date: 3 Total lbs lost since last in-office visit: 0  Interim History:  Regina Christensen provided a recall of her typical day of intake.  Breakfast-2 eggs, or oatmeal, or 1/2 sandwich, or protein shake ([employer]).  Lunch-microwave meal.  Dinner-meat protein, sparse vegetables.   Reviewed Bioimpedance results with pt: Muscle mas +9.2 lbs Adipose mass -8.2 lbs.   Subjective:   1. Type 2 diabetes mellitus with other specified complication, without long-term current use of insulin (HCC) PCP initially started on Victoza from 07/08/2016 - 02/07/2017.   Victoza replaced with Ozempic 01/2017, has been on this GLP-1 since that time. She has a titrated up to Ozempic 2 mg, injects on Sundays.   She denies mass in neck, dysphagia, dyspepsia, persistent hoarseness, abd pain, or N/V/Constipation. 08/28/2021 A1c was 5.8, at goal.    2. Vitamin D deficiency On 08/28/2021, Regina Christensen's Vitamin D level was 38.1, below goal of 50-70.  Assessment/Plan:   1. Type 2 diabetes mellitus with other specified complication, without long-term current use of insulin (HCC) We will refill Ozempic 2 mg once weekly for 1 month.   - Semaglutide, 2 MG/DOSE, (OZEMPIC, 2 MG/DOSE,) 8 MG/3ML SOPN; Inject 2 mg into the skin once a week.  Dispense: 3 mL; Refill: 0  2. Vitamin D deficiency We will refill Ergo 50,000 IU once weekly for 90 days.   - Vitamin D, Ergocalciferol, (DRISDOL) 1.25 MG (50000 UNIT) CAPS capsule; Take 1 capsule (50,000  Units total) by mouth every 7 (seven) days.  Dispense: 12 capsule; Refill: 1  3. Obesity, current BMI 38.3 Regina Christensen is currently in the action stage of change. As such, her goal is to continue with weight loss efforts. She has agreed to the Category 1 Plan.   Exercise goals: As is.   Behavioral modification strategies: meal planning and cooking strategies, keeping healthy foods in the home, and planning for success.  Regina Christensen has agreed to follow-up with our clinic in 4 weeks. She was informed of the importance of frequent follow-up visits to maximize her success with intensive lifestyle modifications for her multiple health conditions.   Objective:   Blood pressure 114/76, pulse 77, temperature 98.3 F (36.8 C), height 5\' 8"  (1.727 m), weight 252 lb (114.3 kg), SpO2 96 %. Body mass index is 38.32 kg/m.  General: Cooperative, alert, well developed, in no acute distress. HEENT: Conjunctivae and lids unremarkable. Cardiovascular: Regular rhythm.  Lungs: Normal work of breathing. Neurologic: No focal deficits.   Lab Results  Component Value Date   CREATININE 0.70 08/28/2021   BUN 10 08/28/2021   NA 145 (H) 08/28/2021   K 3.6 08/28/2021   CL 106 08/28/2021   CO2 26 08/28/2021   Lab Results  Component Value Date   ALT 12 08/28/2021   AST 15 08/28/2021   ALKPHOS 100 08/28/2021   BILITOT 0.3 08/28/2021   Lab Results  Component Value Date   HGBA1C 5.8 (H) 08/28/2021   HGBA1C 5.6 03/13/2021   HGBA1C 6.0 (H) 08/16/2020   HGBA1C  6.2 (H) 03/01/2020   HGBA1C 6.3 (H) 11/29/2019   Lab Results  Component Value Date   INSULIN 8.0 08/28/2021   Lab Results  Component Value Date   TSH 1.250 11/14/2020   Lab Results  Component Value Date   CHOL 130 03/13/2021   HDL 68 03/13/2021   LDLCALC 46 03/13/2021   TRIG 77 03/13/2021   CHOLHDL 1.9 03/13/2021   Lab Results  Component Value Date   VD25OH 38.1 08/28/2021   VD25OH 37.3 11/14/2020   Lab Results  Component Value  Date   WBC 7.9 03/13/2021   HGB 12.4 03/13/2021   HCT 38.8 03/13/2021   MCV 87.2 03/13/2021   PLT 382 03/13/2021   Lab Results  Component Value Date   IRON 39 11/14/2020   TIBC 329 11/14/2020   FERRITIN 25 11/14/2020    Obesity Behavioral Intervention:   Approximately 15 minutes were spent on the discussion below.  ASK: We discussed the diagnosis of obesity with Regina Christensen today and Regina Christensen agreed to give [place] permission to discuss obesity behavioral modification therapy today.  ASSESS: Regina Christensen has the diagnosis of obesity and her BMI today is 38.3. Regina Christensen is in the action stage of change.   ADVISE: Regina Christensen was educated on the multiple health risks of obesity as well as the benefit of weight loss to improve her health. She was advised of the need for long term treatment and the importance of lifestyle modifications to improve her current health and to decrease her risk of future health problems.  AGREE: Multiple dietary modification options and treatment options were discussed and Regina Christensen agreed to follow the recommendations documented in the above note.  ARRANGE: Regina Christensen was educated on the importance of frequent visits to treat obesity as outlined per CMS and USPSTF guidelines and agreed to schedule her next follow up appointment today.  Attestation Statements:   Reviewed by clinician on day of visit: allergies, medications, problem list, medical history, surgical history, family history, social history, and previous encounter notes.   Trude Mcburney, am acting as transcriptionist for William Hamburger, NP.  I have reviewed the above documentation for accuracy and completeness, and I agree with the above. -  Rosann Gorum d. Jacqueline Delapena, NP-C

## 2021-11-27 ENCOUNTER — Encounter (INDEPENDENT_AMBULATORY_CARE_PROVIDER_SITE_OTHER): Payer: Self-pay

## 2021-11-28 ENCOUNTER — Ambulatory Visit: Payer: Medicare HMO | Admitting: Cardiology

## 2021-11-28 ENCOUNTER — Encounter: Payer: Self-pay | Admitting: Cardiology

## 2021-11-28 VITALS — BP 122/70 | HR 80 | Ht 68.0 in | Wt 257.6 lb

## 2021-11-28 DIAGNOSIS — I10 Essential (primary) hypertension: Secondary | ICD-10-CM

## 2021-11-28 DIAGNOSIS — R600 Localized edema: Secondary | ICD-10-CM

## 2021-11-28 DIAGNOSIS — Z6839 Body mass index (BMI) 39.0-39.9, adult: Secondary | ICD-10-CM

## 2021-11-28 DIAGNOSIS — I480 Paroxysmal atrial fibrillation: Secondary | ICD-10-CM

## 2021-11-28 MED ORDER — APIXABAN 5 MG PO TABS: 5.0000 mg | ORAL_TABLET | Freq: Two times a day (BID) | ORAL | 0 refills | Status: DC

## 2021-11-28 NOTE — Patient Instructions (Signed)
Medication Instructions:   Your physician recommends that you continue on your current medications as directed. Please refer to the Current Medication list given to you today.  *If you need a refill on your cardiac medications before your next appointment, please call your pharmacy*   Lab Work:  None ordered  Testing/Procedures:  None ordered   Follow-Up: At [facility], you and your health needs are our priority.  As part of our continuing mission to provide you with exceptional heart care, we have created designated Provider Care Teams.  These Care Teams include your primary Cardiologist (physician) and Advanced Practice Providers (APPs -  Physician Assistants and Nurse Practitioners) who all work together to provide you with the care you need, when you need it.  We recommend signing up for the patient portal called "MyChart".  Sign up information is provided on this After Visit Summary.  MyChart is used to connect with patients for Virtual Visits (Telemedicine).  Patients are able to view lab/test results, encounter notes, upcoming appointments, etc.  Non-urgent messages can be sent to your provider as well.   To learn more about what you can do with MyChart, go to [email or web address].    Your next appointment:   1 year(s)  The format for your next appointment:   In Person  Provider:   You may see Dr. Azucena Cecil or one of the following Advanced Practice Providers on your designated Care Team:   Nicolasa Ducking, NP Eula Listen, PA-C Cadence Fransico Michael, PA-C{   Important Information About Sugar

## 2021-11-28 NOTE — Progress Notes (Signed)
Cardiology Office Note:    Date:  11/28/2021   ID:  Regina Christensen, DOB June 12, 1955, MRN [id number]  PCP:  [name], MD  Cardiologist:  None  Electrophysiologist:  None   Referring MD: [name], MD   Chief Complaint  Patient presents with   Follow-up    2 month follow up, no new cardiac concerns    History of Present Illness:    Regina Christensen is a 66 y.o. female with a hx of hypertension, paroxysmal A. fib on Eliquis, CVA 02/2020 (2/2 not taking eliquis), obesity, sleep apnea who presents for follow-up.    Previously seen due to leg edema, amlodipine decreased to 5 mg daily with improvement in symptoms.  Still has some swelling after standing for long periods.  Otherwise feels well, denies any palpitations, bleeding issues with Eliquis.  BP controlled   Prior notes Echocardiogram 02/2020 EF 55 to 60%, mildly dilated left atrium  Past Medical History:  Diagnosis Date   Anxiety    Arthritis    Back pain    BPPV (benign paroxysmal positional vertigo)    Chronic diastolic HF (heart failure) (HCC)    Complication of anesthesia    Post-operative hypoxia requiring supplemental oxygen   Current use of long term anticoagulation    Apixaban   CVA (cerebral vascular accident) (HCC) 02/28/2020   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; imaging from [facility]   Depression    Edema of both lower extremities    GERD (gastroesophageal reflux disease)    Hypertension    IBS (irritable bowel syndrome)    Lactose intolerance    Morbid obesity with BMI of 45.0-49.9, adult (HCC)    Obesity    Osteoarthritis    PAF (paroxysmal atrial fibrillation) (HCC)    Pre-diabetes    Sleep apnea    uses CPAP machine sometimes    Type 2 diabetes mellitus without complication (HCC)    Vitamin D deficiency     Past Surgical History:  Procedure Laterality Date   ABDOMINAL HYSTERECTOMY     BREAST EXCISIONAL BIOPSY Left    ENDOMETRIAL ABLATION     x2   ENDOSCOPIC CONCHA  BULLOSA RESECTION Bilateral 08/08/2020   Procedure: ENDOSCOPIC CONCHA BULLOSA RESECTION;  Surgeon: Vernie Murders, MD;  Location: [facility] ORS;  Service: ENT;  Laterality: Bilateral;   ETHMOIDECTOMY Right 08/08/2020   Procedure: ETHMOIDECTOMY, LEFT PARTIAL ETHMOIDECTOMY;  Surgeon: Vernie Murders, MD;  Location: [facility] ORS;  Service: ENT;  Laterality: Right;   IMAGE GUIDED SINUS SURGERY N/A 08/08/2020   Procedure: IMAGE GUIDED SINUS SURGERY, RIGHT FRONTAL SINUSOTOMY;  Surgeon: Vernie Murders, MD;  Location: [facility] OR[facility];  Service: ENT;  Laterality: N/A;   LAPAROSCOPIC SLEEVE GASTRECTOMY     MAXILLARY ANTROSTOMY Bilateral 08/08/2020   Procedure: MAXILLARY ANTROSTOMY with tissue;  Surgeon: Vernie Murders, MD;  Location: [facility] ORS;  Service: ENT;  Laterality: Bilateral;   NASAL TURBINATE REDUCTION  08/27/2020   Procedure: TURBINATE REDUCTION /SUBMUCOSAL DEBRIDEMENT;  Surgeon: Vernie Murders, MD;  Location: [facility] OR[facility];  Service: ENT;;   TOTAL KNEE ARTHROPLASTY Right 06/2019   TOTAL KNEE ARTHROPLASTY Left 01/31/2021   [facility]    Current Medications: Current Meds  Medication Sig   acetaminophen (TYLENOL) 650 MG CR tablet Take 1 tablet (650 mg total) by mouth every 8 (eight) hours as needed for pain.   amLODipine (NORVASC) 5 MG tablet Take 1 tablet (5 mg total) by mouth daily.   atorvastatin (LIPITOR) 10 MG tablet Take  1 tablet (10 mg total) by mouth daily.   famotidine (PEPCID) 20 MG tablet Take 1 tablet (20 mg total) by mouth daily.   furosemide (LASIX) 20 MG tablet Take 1 tablet (20 mg total) by mouth daily as needed.   hydrOXYzine (ATARAX) 25 MG tablet TAKE 0.5-1 TABLETS (12.5-25 MG TOTAL) BY MOUTH AT BEDTIME AS NEEDED. FOR SLEEP   meclizine (ANTIVERT) 12.5 MG tablet Take 1 tablet (12.5 mg total) by mouth 3 (three) times daily as needed for dizziness.   methocarbamol (ROBAXIN) 500 MG tablet Take 500 mg by mouth at bedtime.   metoprolol tartrate (LOPRESSOR) 100 MG tablet TAKE 1 TABLET  BY MOUTH TWICE A DAY   olmesartan (BENICAR) 40 MG tablet Take 1 tablet (40 mg total) by mouth daily.   potassium chloride (KLOR-CON M) 10 MEQ tablet Take 2 tablets (20 mEq total) by mouth daily. Take with Lasix   Semaglutide, 2 MG/DOSE, (OZEMPIC, 2 MG/DOSE,) 8 MG/3ML SOPN Inject 2 mg into the skin once a week.   tiZANidine (ZANAFLEX) 4 MG tablet    triamcinolone (NASACORT) 55 MCG/ACT AERO nasal inhaler Place 2 sprays into the nose daily.   venlafaxine XR (EFFEXOR-XR) 37.5 MG 24 hr capsule Take 1 capsule (37.5 mg total) by mouth daily with breakfast. Take along with 75 mg daily   venlafaxine XR (EFFEXOR-XR) 75 MG 24 hr capsule Take 1 capsule (75 mg total) by mouth daily with breakfast. Take along with 37.5 mg daily   Vitamin D, Ergocalciferol, (DRISDOL) 1.25 MG (50000 UNIT) CAPS capsule Take 1 capsule (50,000 Units total) by mouth every 7 (seven) days.   XIIDRA 5 % SOLN Place 1 drop into both eyes 2 (two) times daily.   [DISCONTINUED] apixaban (ELIQUIS) 5 MG TABS tablet Take 1 tablet (5 mg total) by mouth 2 (two) times daily. Please call the office to schedule an appointment.     Allergies:   Hydrocodone-acetaminophen, Ace inhibitors, and Hctz [hydrochlorothiazide]   Social History   Socioeconomic History   Marital status: Divorced    Spouse name: Not on file   Number of children: 2   Years of education: Not on file   Highest education level: Associate degree: occupational, Scientist, product/process development, or vocational program  Occupational History   Occupation: Retired/ work PT    Employer: [employer]  Tobacco Use   Smoking status: Never   Smokeless tobacco: Never  Vaping Use   Vaping Use: Never used  Substance and Sexual Activity   Alcohol use: No    Alcohol/week: 0.0 standard drinks of alcohol    Comment: rare   Drug use: No   Sexual activity: Not on file  Other Topics Concern   Not on file  Social History Narrative   Not on file   Social Determinants of Health   Financial Resource Strain: Low Risk   (10/18/2021)   Overall Financial Resource Strain (CARDIA)    Difficulty of Paying Living Expenses: Not hard at all  Food Insecurity: No Food Insecurity (10/18/2021)   Hunger Vital Sign    Worried About Running Out of Food in the Last Year: Never true    Ran Out of Food in the Last Year: Never true  Transportation Needs: No Transportation Needs (10/18/2021)   PRAPARE - Administrator, Civil Service (Medical): No    Lack of Transportation (Non-Medical): No  Physical Activity: Insufficiently Active (10/18/2021)   Exercise Vital Sign    Days of Exercise per Week: 3 days    Minutes of  Exercise per Session: 10 min  Stress: No Stress Concern Present (10/18/2021)   [employer] of Occupational Health - Occupational Stress Questionnaire    Feeling of Stress : Only a little  Social Connections: Moderately Integrated (10/18/2021)   Social Connection and Isolation Panel [NHANES]    Frequency of Communication with Friends and Family: More than three times a week    Frequency of Social Gatherings with Friends and Family: More than three times a week    Attends Religious Services: More than 4 times per year    Active Member of [employer] or Organizations: Yes    Attends Banker Meetings: Never    Marital Status: Divorced     Family History: The patient's family history includes Cancer in her brother; Diabetes in her mother; Heart disease in her father; High blood pressure in her mother. There is no history of Mental illness.  Her sister has atrial fibrillation, multiple nephews have atrial fibrillation.  ROS:   Please see the history of present illness.     All other systems reviewed and are negative.  EKGs/Labs/Other Studies Reviewed:      EKG:  EKG is  ordered today.  The ekg ordered today demonstrates normal sinus rhythm  Recent Labs: 03/13/2021: Hemoglobin 12.4; Platelets 382 08/28/2021: ALT 12; BUN 10; Creatinine, Ser 0.70; Potassium 3.6; Sodium 145  Recent Lipid  Panel    Component Value Date/Time   CHOL 130 03/13/2021 0827   CHOL 151 12/05/2014 1117   TRIG 77 03/13/2021 0827   HDL 68 03/13/2021 0827   HDL 74 12/05/2014 1117   CHOLHDL 1.9 03/13/2021 0827   VLDL 14 03/01/2020 0306   LDLCALC 46 03/13/2021 0827    Physical Exam:    VS:  BP 122/70 (BP Location: Left Arm, Patient Position: Sitting, Cuff Size: Large)   Pulse 80   Ht 5\' 8"  (1.727 m)   Wt 257 lb 9.6 oz (116.8 kg)   SpO2 98%   BMI 39.17 kg/m     Wt Readings from Last 3 Encounters:  11/28/21 257 lb 9.6 oz (116.8 kg)  11/14/21 252 lb (114.3 kg)  10/18/21 254 lb (115.2 kg)     GEN:  Well nourished, well developed in no acute distress HEENT: Normal NECK: No JVD; No carotid bruits CARDIAC:RRR, no murmurs, rubs, gallops RESPIRATORY:  Clear to auscultation without rales, wheezing or rhonchi  ABDOMEN: Soft, non-tender, distended MUSCULOSKELETAL:  trace edema; No deformity  SKIN: Warm and dry NEUROLOGIC:  Alert and oriented x 3 PSYCHIATRIC:  Normal affect   ASSESSMENT:    1. Paroxysmal atrial fibrillation (HCC)   2. Primary hypertension   3. BMI 39.0-39.9,adult   4. Leg edema      PLAN:    Paroxysmal atrial fibrillation.  Maintaining sinus rhythm on beta-blocker, CHA2DS2-VASc score of 4 (htn, gender, CVA).  Continue Eliquis 5 mg twice daily, Lopressor 100 mg twice daily. Hypertension, BP controlled.  Continue Norvasc 5 mg daily, Lopressor.  Obesity, low-calorie diet, weight loss advised.  On Ozempic. Leg edema, appears dependent, improved with reducing Norvasc to 5 mg daily.  Okay to take Lasix as needed.  Obesity likely contributing.  Follow-up in 12 months  Medication Adjustments/Labs and Tests Ordered: Current medicines are reviewed at length with the patient today.  Concerns regarding medicines are outlined above.  Orders Placed This Encounter  Procedures   EKG 12-Lead    Meds ordered this encounter  Medications   apixaban (ELIQUIS) 5 MG TABS tablet  Sig: Take 1 tablet (5 mg total) by mouth 2 (two) times daily.    Dispense:  180 tablet    Refill:  0     Patient Instructions  Medication Instructions:   Your physician recommends that you continue on your current medications as directed. Please refer to the Current Medication list given to you today.  *If you need a refill on your cardiac medications before your next appointment, please call your pharmacy*   Lab Work:  None ordered  Testing/Procedures:  None ordered   Follow-Up: At [facility], you and your health needs are our priority.  As part of our continuing mission to provide you with exceptional heart care, we have created designated Provider Care Teams.  These Care Teams include your primary Cardiologist (physician) and Advanced Practice Providers (APPs -  Physician Assistants and Nurse Practitioners) who all work together to provide you with the care you need, when you need it.  We recommend signing up for the patient portal called "MyChart".  Sign up information is provided on this After Visit Summary.  MyChart is used to connect with patients for Virtual Visits (Telemedicine).  Patients are able to view lab/test results, encounter notes, upcoming appointments, etc.  Non-urgent messages can be sent to your provider as well.   To learn more about what you can do with MyChart, go to [email or web address].    Your next appointment:   1 year(s)  The format for your next appointment:   In Person  Provider:   You may see Dr. Azucena Cecil or one of the following Advanced Practice Providers on your designated Care Team:   Nicolasa Ducking, NP Eula Listen, PA-C Cadence Fransico Michael, PA-C{   Important Information About Sugar         Signed, Debbe Odea, MD  11/28/2021 9:03 AM    [facility]

## 2021-11-29 ENCOUNTER — Other Ambulatory Visit (INDEPENDENT_AMBULATORY_CARE_PROVIDER_SITE_OTHER): Payer: Self-pay | Admitting: Adult Health

## 2021-11-29 DIAGNOSIS — E1169 Type 2 diabetes mellitus with other specified complication: Secondary | ICD-10-CM

## 2021-12-09 ENCOUNTER — Telehealth: Payer: Self-pay

## 2021-12-09 NOTE — Telephone Encounter (Signed)
Ozempic samples

## 2021-12-11 DIAGNOSIS — M1812 Unilateral primary osteoarthritis of first carpometacarpal joint, left hand: Secondary | ICD-10-CM | POA: Diagnosis not present

## 2021-12-11 DIAGNOSIS — M545 Low back pain, unspecified: Secondary | ICD-10-CM | POA: Diagnosis not present

## 2021-12-17 DIAGNOSIS — M545 Low back pain, unspecified: Secondary | ICD-10-CM | POA: Diagnosis not present

## 2021-12-18 ENCOUNTER — Encounter (INDEPENDENT_AMBULATORY_CARE_PROVIDER_SITE_OTHER): Payer: Self-pay | Admitting: Adult Health

## 2021-12-18 ENCOUNTER — Ambulatory Visit (INDEPENDENT_AMBULATORY_CARE_PROVIDER_SITE_OTHER): Payer: Medicare HMO | Admitting: Adult Health

## 2021-12-18 ENCOUNTER — Telehealth: Payer: Self-pay | Admitting: Emergency Medicine

## 2021-12-18 VITALS — BP 132/84 | HR 80 | Temp 98.0°F | Ht 68.0 in | Wt 256.0 lb

## 2021-12-18 DIAGNOSIS — Z6839 Body mass index (BMI) 39.0-39.9, adult: Secondary | ICD-10-CM | POA: Diagnosis not present

## 2021-12-18 DIAGNOSIS — E1169 Type 2 diabetes mellitus with other specified complication: Secondary | ICD-10-CM | POA: Diagnosis not present

## 2021-12-18 DIAGNOSIS — Z7985 Long-term (current) use of injectable non-insulin antidiabetic drugs: Secondary | ICD-10-CM

## 2021-12-18 DIAGNOSIS — E669 Obesity, unspecified: Secondary | ICD-10-CM | POA: Diagnosis not present

## 2021-12-18 NOTE — Telephone Encounter (Signed)
Patient called and stated she is in donut hole for the Eliquis. Would like to be referred to [facility] in Medication Management to see if he can help her get this medication

## 2021-12-19 ENCOUNTER — Other Ambulatory Visit: Payer: Self-pay | Admitting: Family Medicine

## 2021-12-19 DIAGNOSIS — I4819 Other persistent atrial fibrillation: Secondary | ICD-10-CM

## 2021-12-19 DIAGNOSIS — Z8673 Personal history of transient ischemic attack (TIA), and cerebral infarction without residual deficits: Secondary | ICD-10-CM

## 2021-12-19 NOTE — Telephone Encounter (Signed)
Patient notified

## 2021-12-20 ENCOUNTER — Telehealth: Payer: Self-pay | Admitting: *Deleted

## 2021-12-20 ENCOUNTER — Other Ambulatory Visit: Payer: Self-pay

## 2021-12-20 DIAGNOSIS — M4316 Spondylolisthesis, lumbar region: Secondary | ICD-10-CM | POA: Diagnosis not present

## 2021-12-20 DIAGNOSIS — I480 Paroxysmal atrial fibrillation: Secondary | ICD-10-CM

## 2021-12-20 DIAGNOSIS — M545 Low back pain, unspecified: Secondary | ICD-10-CM | POA: Diagnosis not present

## 2021-12-20 MED ORDER — APIXABAN 5 MG PO TABS: 5.0000 mg | ORAL_TABLET | Freq: Two times a day (BID) | ORAL | 1 refills | Status: DC

## 2021-12-20 NOTE — Telephone Encounter (Signed)
Prescription refill request for Eliquis received. Indication: Afib  Last office visit: 11/28/21 ([place])  Scr: 0.70 (08/28/21)  Age: 66 Weight: 116.1kg  Appropriate dose and refill sent to requested pharmacy.

## 2021-12-20 NOTE — Chronic Care Management (AMB) (Unsigned)
  Chronic Care Management   Outreach Note  12/20/2021 Name: [name] MRN: [id number] DOB: [birth year]  Regina Christensen is a 66 y.o. year old female who is a primary care patient of [name], MD. I reached out to [name] by phone today in response to a referral sent by Ms. Lennox Pippins primary care provider.  An unsuccessful telephone outreach was attempted today. The patient was referred to the case management team for assistance with care management and care coordination.   Follow Up Plan: A HIPAA compliant phone message was left for the patient providing contact information and requesting a return call.   Burman Nieves, CCMA Care Coordination Care Guide Direct Dial: [phone]

## 2021-12-21 NOTE — Progress Notes (Unsigned)
Chief Complaint:   OBESITY Regina Christensen is here to discuss her progress with her obesity treatment plan along with follow-up of her obesity related diagnoses. Regina Christensen is on the Category 1 Plan and states she is following her eating plan approximately 85% of the time. Regina Christensen states she is walking no specific minutes or times per week.  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   Subjective:   1. Type 2 diabetes mellitus with other specified complication, without long-term current use of insulin (HCC) Fasting 100's-110's 08/28/2021 A1c 5.8.  she was unable to afford last refill because of donut hole with medicare.  She has 1 months supply of Ozempic 2 mg.  She has been on Metformin in the past.  *** Assessment/Plan:   1. Type 2 diabetes mellitus with other specified complication, without long-term current use of insulin (HCC) Call insurance and inqure about cost of Rybelsus.  Contact [facility] with information.   2. Obesity, current BMI 39.0 Regina Christensen is currently in the action stage of change. As such, her goal is to continue with weight loss efforts. She has agreed to the Category 1 Plan.   Exercise goals:  As is.   Behavioral modification strategies: increasing lean protein intake, decreasing simple carbohydrates, meal planning and cooking strategies, keeping healthy foods in the home, and planning for success.  Regina Christensen has agreed to follow-up with our clinic in 4 weeks. She was informed of the importance of frequent follow-up visits to maximize her success with intensive lifestyle modifications for her multiple health conditions.   Objective:   Blood pressure 132/84, pulse 80,  temperature 98 F (36.7 C), height 5\' 8"  (1.727 m), weight 256 lb (116.1 kg), SpO2 97 %. Body mass index is 38.92 kg/m.  General: Cooperative, alert, well developed, in no acute distress. HEENT: Conjunctivae and lids unremarkable. Cardiovascular: Regular rhythm.  Lungs: Normal work of breathing. Neurologic: No focal deficits.   Lab Results  Component Value Date   CREATININE 0.70 08/28/2021   BUN 10 08/28/2021   NA 145 (H) 08/28/2021   K 3.6 08/28/2021   CL 106 08/28/2021   CO2 26 08/28/2021   Lab Results  Component Value Date   ALT 12 08/28/2021   AST 15 08/28/2021   ALKPHOS 100 08/28/2021   BILITOT 0.3 08/28/2021   Lab Results  Component Value Date   HGBA1C 5.8 (H) 08/28/2021   HGBA1C 5.6 03/13/2021   HGBA1C 6.0 (H) 08/16/2020   HGBA1C 6.2 (H) 03/01/2020   HGBA1C 6.3 (H) 11/29/2019   Lab Results  Component Value Date   INSULIN 8.0 08/28/2021   Lab Results  Component Value Date   TSH 1.250 11/14/2020   Lab Results  Component Value Date   CHOL 130 03/13/2021   HDL 68 03/13/2021   LDLCALC 46 03/13/2021   TRIG 77 03/13/2021   CHOLHDL 1.9 03/13/2021   Lab Results  Component Value Date   VD25OH 38.1 08/28/2021   VD25OH 37.3 11/14/2020   Lab Results  Component Value Date   WBC 7.9 03/13/2021   HGB 12.4 03/13/2021   HCT 38.8 03/13/2021   MCV 87.2 03/13/2021   PLT 382 03/13/2021   Lab Results  Component Value  Date   IRON 39 11/14/2020   TIBC 329 11/14/2020   FERRITIN 25 11/14/2020    Obesity Behavioral Intervention:   Approximately 15 minutes were spent on the discussion below.  ASK: We discussed the diagnosis of obesity with Regina Christensen today and Regina Christensen agreed to give [place] permission to discuss obesity behavioral modification therapy today.  ASSESS: Regina Christensen has the diagnosis of obesity and her BMI today is 39.0. Regina Christensen is in the action stage of change.   ADVISE: Regina Christensen was educated on the multiple health risks of obesity as well as the  benefit of weight loss to improve her health. She was advised of the need for long term treatment and the importance of lifestyle modifications to improve her current health and to decrease her risk of future health problems.  AGREE: Multiple dietary modification options and treatment options were discussed and Regina Christensen agreed to follow the recommendations documented in the above note.  ARRANGE: Regina Christensen was educated on the importance of frequent visits to treat obesity as outlined per CMS and USPSTF guidelines and agreed to schedule her next follow up appointment today.  Attestation Statements:   Reviewed by clinician on day of visit: allergies, medications, problem list, medical history, surgical history, family history, social history, and previous encounter notes.  Time spent on visit including pre-visit chart review and post-visit care and charting was 27 minutes.   I, Malcolm Metro, RMA, am acting as Energy manager for William Hamburger, NP.  I have reviewed the above documentation for accuracy and completeness, and I agree with the above. -  ***

## 2021-12-24 NOTE — Chronic Care Management (AMB) (Signed)
  Chronic Care Management   Note  12/24/2021 Name: Regina Christensen MRN: [id number] DOB: [birth year]  Regina Christensen is a 66 y.o. year old female who is a primary care patient of Steele Sizer, MD. I reached out to Mauricio Po by phone today in response to a referral sent by Regina Christensen PCP.  Regina Christensen was given information about Chronic Care Management services today including:  CCM service includes personalized support from designated clinical staff supervised by her physician, including individualized plan of care and coordination with other care providers 24/7 contact phone numbers for assistance for urgent and routine care needs. Service will only be billed when office clinical staff spend 20 minutes or more in a month to coordinate care. Only one practitioner may furnish and bill the service in a calendar month. The patient may stop CCM services at any time (effective at the end of the month) by phone call to the office staff. The patient is responsible for co-pay (up to 20% after annual deductible is met) if co-pay is required by the individual health plan.   Patient agreed to services and verbal consent obtained.   Follow up plan: Telephone appointment with care management team member scheduled for: 01/22/2022  Julian Hy, [place] Direct Dial: [phone]

## 2021-12-25 ENCOUNTER — Telehealth: Payer: Self-pay | Admitting: Cardiology

## 2021-12-25 DIAGNOSIS — I480 Paroxysmal atrial fibrillation: Secondary | ICD-10-CM

## 2021-12-25 NOTE — Telephone Encounter (Signed)
Called patient and she informed me that she is in the donut hole right now with her Eliquis and she cannot afford it. I informed her that I would leave samples at our front desk and also a PAF to be filled out so we can help her apply for assistance.   Patient was very grateful for the follow up.

## 2021-12-25 NOTE — Telephone Encounter (Signed)
Patient calling the office for samples of medication:   1.  What medication and dosage are you requesting samples for? apixaban (ELIQUIS) 5 MG TABS tablet  2.  Are you currently out of this medication? Yes    

## 2021-12-26 ENCOUNTER — Ambulatory Visit: Payer: Medicare HMO | Admitting: Psychiatry

## 2021-12-30 ENCOUNTER — Ambulatory Visit (INDEPENDENT_AMBULATORY_CARE_PROVIDER_SITE_OTHER): Payer: Medicare HMO

## 2021-12-30 ENCOUNTER — Encounter: Payer: Self-pay | Admitting: Cardiology

## 2021-12-30 ENCOUNTER — Ambulatory Visit: Payer: Medicare HMO | Attending: Cardiology | Admitting: Cardiology

## 2021-12-30 VITALS — BP 120/78 | HR 73 | Ht 68.0 in | Wt 260.0 lb

## 2021-12-30 DIAGNOSIS — I10 Essential (primary) hypertension: Secondary | ICD-10-CM

## 2021-12-30 DIAGNOSIS — I480 Paroxysmal atrial fibrillation: Secondary | ICD-10-CM

## 2021-12-30 DIAGNOSIS — Z6839 Body mass index (BMI) 39.0-39.9, adult: Secondary | ICD-10-CM

## 2021-12-30 NOTE — Progress Notes (Signed)
Cardiology Office Note:    Date:  12/30/2021   ID:  Regina Christensen, DOB January 10, 1956, MRN [id number]  PCP:  [name], MD  Cardiologist:  None  Electrophysiologist:  None   Referring MD: [name], MD   Chief Complaint  Patient presents with   Other    C/o afib. Meds reviewed verbally with pt.    History of Present Illness:    Regina Christensen is a 66 y.o. female with a hx of hypertension, paroxysmal A. fib on Eliquis, CVA 02/2020 (2/2 not taking eliquis), obesity, sleep apnea who presents for follow-up due to palpitations.  She was seen less than a month ago doing okay.  She states undergoing stressful events recently, including falling in the donut hole for Eliquis payment and also obtaining an MRI despite being claustrophobic.  She thinks the stressful events might of triggered A-fib.  Complains of palpitations associated with shortness of breath.  Overall symptoms have improved over the past week or so.  Patient assistance form was provided and she is in the process of filling and submitting.   Prior notes Echocardiogram 02/2020 EF 55 to 60%, mildly dilated left atrium  Past Medical History:  Diagnosis Date   Anxiety    Arthritis    Back pain    BPPV (benign paroxysmal positional vertigo)    Chronic diastolic HF (heart failure) (HCC)    Complication of anesthesia    Post-operative hypoxia requiring supplemental oxygen   Current use of long term anticoagulation    Apixaban   CVA (cerebral vascular accident) (HCC) 02/28/2020   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; imaging from [facility]   Depression    Edema of both lower extremities    GERD (gastroesophageal reflux disease)    Hypertension    IBS (irritable bowel syndrome)    Lactose intolerance    Morbid obesity with BMI of 45.0-49.9, adult (HCC)    Obesity    Osteoarthritis    PAF (paroxysmal atrial fibrillation) (HCC)    Pre-diabetes    Sleep apnea    uses CPAP machine sometimes    Type 2  diabetes mellitus without complication (HCC)    Vitamin D deficiency     Past Surgical History:  Procedure Laterality Date   ABDOMINAL HYSTERECTOMY     BREAST EXCISIONAL BIOPSY Left    ENDOMETRIAL ABLATION     x2   ENDOSCOPIC CONCHA BULLOSA RESECTION Bilateral 08/08/2020   Procedure: ENDOSCOPIC CONCHA BULLOSA RESECTION;  Surgeon: Vernie Murders, MD;  Location: [facility] ORS;  Service: ENT;  Laterality: Bilateral;   ETHMOIDECTOMY Right 08/08/2020   Procedure: ETHMOIDECTOMY, LEFT PARTIAL ETHMOIDECTOMY;  Surgeon: Vernie Murders, MD;  Location: [facility] OR[facility];  Service: ENT;  Laterality: Right;   IMAGE GUIDED SINUS SURGERY N/A 08/08/2020   Procedure: IMAGE GUIDED SINUS SURGERY, RIGHT FRONTAL SINUSOTOMY;  Surgeon: Vernie Murders, MD;  Location: [facility] OR[facility];  Service: ENT;  Laterality: N/A;   LAPAROSCOPIC SLEEVE GASTRECTOMY     MAXILLARY ANTROSTOMY Bilateral 08/08/2020   Procedure: MAXILLARY ANTROSTOMY with tissue;  Surgeon: Vernie Murders, MD;  Location: [facility] OR[facility];  Service: ENT;  Laterality: Bilateral;   NASAL TURBINATE REDUCTION  08/27/2020   Procedure: TURBINATE REDUCTION /SUBMUCOSAL DEBRIDEMENT;  Surgeon: Vernie Murders, MD;  Location: [facility] OR[facility];  Service: ENT;;   TOTAL KNEE ARTHROPLASTY Right 06/2019   TOTAL KNEE ARTHROPLASTY Left 01/31/2021   [facility]    Current Medications: Current Meds  Medication Sig   acetaminophen (TYLENOL) 650 MG CR tablet  Take 1 tablet (650 mg total) by mouth every 8 (eight) hours as needed for pain.   amLODipine (NORVASC) 5 MG tablet Take 1 tablet (5 mg total) by mouth daily.   apixaban (ELIQUIS) 5 MG TABS tablet Take 1 tablet (5 mg total) by mouth 2 (two) times daily.   atorvastatin (LIPITOR) 10 MG tablet Take 1 tablet (10 mg total) by mouth daily.   famotidine (PEPCID) 20 MG tablet Take 1 tablet (20 mg total) by mouth daily.   furosemide (LASIX) 20 MG tablet Take 1 tablet (20 mg total) by mouth daily as needed.   hydrOXYzine (ATARAX) 25 MG tablet  TAKE 0.5-1 TABLETS (12.5-25 MG TOTAL) BY MOUTH AT BEDTIME AS NEEDED. FOR SLEEP   meclizine (ANTIVERT) 12.5 MG tablet Take 1 tablet (12.5 mg total) by mouth 3 (three) times daily as needed for dizziness.   methocarbamol (ROBAXIN) 500 MG tablet Take 500 mg by mouth at bedtime.   metoprolol tartrate (LOPRESSOR) 100 MG tablet TAKE 1 TABLET BY MOUTH TWICE A DAY   olmesartan (BENICAR) 40 MG tablet Take 1 tablet (40 mg total) by mouth daily.   potassium chloride (KLOR-CON M) 10 MEQ tablet Take 2 tablets (20 mEq total) by mouth daily. Take with Lasix   Semaglutide, 2 MG/DOSE, (OZEMPIC, 2 MG/DOSE,) 8 MG/3ML SOPN Inject 2 mg into the skin once a week.   tiZANidine (ZANAFLEX) 4 MG tablet Take 4 mg by mouth as needed.   triamcinolone (NASACORT) 55 MCG/ACT AERO nasal inhaler Place 2 sprays into the nose daily.   venlafaxine XR (EFFEXOR-XR) 37.5 MG 24 hr capsule Take 1 capsule (37.5 mg total) by mouth daily with breakfast. Take along with 75 mg daily   venlafaxine XR (EFFEXOR-XR) 75 MG 24 hr capsule Take 1 capsule (75 mg total) by mouth daily with breakfast. Take along with 37.5 mg daily   Vitamin D, Ergocalciferol, (DRISDOL) 1.25 MG (50000 UNIT) CAPS capsule Take 1 capsule (50,000 Units total) by mouth every 7 (seven) days.   XIIDRA 5 % SOLN Place 1 drop into both eyes 2 (two) times daily.     Allergies:   Hydrocodone-acetaminophen, Ace inhibitors, and Hctz [hydrochlorothiazide]   Social History   Socioeconomic History   Marital status: Divorced    Spouse name: Not on file   Number of children: 2   Years of education: Not on file   Highest education level: Associate degree: occupational, Scientist, product/process developmenttechnical, or vocational program  Occupational History   Occupation: Retired/ work PT    Employer: [employer]  Tobacco Use   Smoking status: Never   Smokeless tobacco: Never  Vaping Use   Vaping Use: Never used  Substance and Sexual Activity   Alcohol use: No    Alcohol/week: 0.0 standard drinks of alcohol     Comment: rare   Drug use: No   Sexual activity: Not on file  Other Topics Concern   Not on file  Social History Narrative   Not on file   Social Determinants of Health   Financial Resource Strain: Low Risk  (10/18/2021)   Overall Financial Resource Strain (CARDIA)    Difficulty of Paying Living Expenses: Not hard at all  Food Insecurity: No Food Insecurity (10/18/2021)   Hunger Vital Sign    Worried About Running Out of Food in the Last Year: Never true    Ran Out of Food in the Last Year: Never true  Transportation Needs: No Transportation Needs (10/18/2021)   PRAPARE - Transportation    Lack of  Transportation (Medical): No    Lack of Transportation (Non-Medical): No  Physical Activity: Insufficiently Active (10/18/2021)   Exercise Vital Sign    Days of Exercise per Week: 3 days    Minutes of Exercise per Session: 10 min  Stress: No Stress Concern Present (10/18/2021)   [employer] of Occupational Health - Occupational Stress Questionnaire    Feeling of Stress : Only a little  Social Connections: Moderately Integrated (10/18/2021)   Social Connection and Isolation Panel [NHANES]    Frequency of Communication with Friends and Family: More than three times a week    Frequency of Social Gatherings with Friends and Family: More than three times a week    Attends Religious Services: More than 4 times per year    Active Member of [employer] or Organizations: Yes    Attends Banker Meetings: Never    Marital Status: Divorced     Family History: The patient's family history includes Cancer in her brother; Diabetes in her mother; Heart disease in her father; High blood pressure in her mother. There is no history of Mental illness.  Her sister has atrial fibrillation, multiple nephews have atrial fibrillation.  ROS:   Please see the history of present illness.     All other systems reviewed and are negative.  EKGs/Labs/Other Studies Reviewed:    EKG:  EKG is  ordered  today.  The ekg ordered today demonstrates normal sinus rhythm  Recent Labs: 03/13/2021: Hemoglobin 12.4; Platelets 382 08/28/2021: ALT 12; BUN 10; Creatinine, Ser 0.70; Potassium 3.6; Sodium 145  Recent Lipid Panel    Component Value Date/Time   CHOL 130 03/13/2021 0827   CHOL 151 12/05/2014 1117   TRIG 77 03/13/2021 0827   HDL 68 03/13/2021 0827   HDL 74 12/05/2014 1117   CHOLHDL 1.9 03/13/2021 0827   VLDL 14 03/01/2020 0306   LDLCALC 46 03/13/2021 0827    Physical Exam:    VS:  BP 120/78 (BP Location: Left Arm, Patient Position: Sitting, Cuff Size: Large)   Pulse 73   Ht 5\' 8"  (1.727 m)   Wt 260 lb (117.9 kg)   SpO2 98%   BMI 39.53 kg/m     Wt Readings from Last 3 Encounters:  12/30/21 260 lb (117.9 kg)  12/18/21 256 lb (116.1 kg)  11/28/21 257 lb 9.6 oz (116.8 kg)     GEN:  Well nourished, well developed in no acute distress HEENT: Normal NECK: No JVD; No carotid bruits CARDIAC:RRR, no murmurs, rubs, gallops RESPIRATORY:  Clear to auscultation without rales, wheezing or rhonchi  ABDOMEN: Soft, non-tender, distended MUSCULOSKELETAL:  trace edema; No deformity  SKIN: Warm and dry NEUROLOGIC:  Alert and oriented x 3 PSYCHIATRIC:  Normal affect   ASSESSMENT:    1. Paroxysmal atrial fibrillation (HCC)   2. Primary hypertension   3. BMI 39.0-39.9,adult     PLAN:    Paroxysmal atrial fibrillation.  Occasional palpitations, place cardiac monitor to document A-fib burden.  EKG shows sinus rhythm.  Stressful events likely cause palpitations.  Continue Lopressor, continue Eliquis.  Refer to A-fib clinic for additional input.  May not need additional therapy if symptoms stay improved with decrease in stress levels. Hypertension, BP controlled.  Continue Norvasc 5 mg daily, Lopressor.  Obesity, low-calorie diet, weight loss advised.  On Ozempic.  Follow-up in 3 months  Medication Adjustments/Labs and Tests Ordered: Current medicines are reviewed at length with the  patient today.  Concerns regarding medicines are outlined above.  Orders Placed This Encounter  Procedures   Ambulatory referral to Cardiac Electrophysiology   LONG TERM MONITOR (3-14 DAYS)    No orders of the defined types were placed in this encounter.    Patient Instructions  Medication Instructions:   Your physician recommends that you continue on your current medications as directed. Please refer to the Current Medication list given to you today.  *If you need a refill on your cardiac medications before your next appointment, please call your pharmacy*    Testing/Procedures:  Your physician has recommended that you wear a Zio XT monitor for 2 weeks. This will be mailed to your home address in 4-5 business days.   Your clinician has requested a Zio heart rhythm monitor by [employer] to be mailed to your home for you to wear for 14 days. You should expect a small box to arrive via USPS (or FedEx in some cases) within this next week. If you do not receive it please call [employer] at [phone].  Closely watching your heart at this time will help your care team understand more and provide information needed to develop your plan of care.  Please apply your Zio patch monitor the day you receive it. Keep this packaging, you will use this to return your Zio monitor.  You will easily be able to apply the monitor with the instructions provided in the Patient Guide.  If you need assistance, [employer] representatives are available 24/7 at [phone].  You can also download the [facility] app on your phone to view detailed application instructions and log symptoms.  After you wear your monitor for 14 days, place it back in the blue box or envelope, along with your Symptom Log.  To send your monitor back: Simply use the pre-addressed and pre-paid box/envelope.  Send it back through [employer] the same day you remove it via your local post office or by placing it in your mailbox.  As soon  as we receive the results, they will be reviewed and your clinician will contact you.  For the first 24 hours- it is essential to not shower or exercise, to allow the patch to adhere to your skin. Avoid excessive sweating to help maximize wear time. Do not submerge the device, no hot tubs, and no swimming pools. Keep any lotions or oils away from the patch. After 24 hours you may shower with the patch on. Take brief showers with your back facing the shower head.  Do not remove patch once it has been placed because that will interrupt data and decrease adhesive wear time. Push the button when you have any symptoms and write down what you were feeling. Once you have completed wearing your monitor, remove and place into box which has postage paid and place in your outgoing mailbox.  If for some reason you have misplaced your box then call our office and we can provide another box and/or mail it off for you.   Follow-Up: At [facility], you and your health needs are our priority.  As part of our continuing mission to provide you with exceptional heart care, we have created designated Provider Care Teams.  These Care Teams include your primary Cardiologist (physician) and Advanced Practice Providers (APPs -  Physician Assistants and Nurse Practitioners) who all work together to provide you with the care you need, when you need it.  We recommend signing up for the patient portal called "MyChart".  Sign up information is provided on this After Visit Summary.  MyChart is used to connect with patients for Virtual Visits (Telemedicine).  Patients are able to view lab/test results, encounter notes, upcoming appointments, etc.  Non-urgent messages can be sent to your provider as well.   To learn more about what you can do with MyChart, go to [email or web address].    Your next appointment:   3 month(s)  The format for your next appointment:   In Person  Provider:   You may see Debbe Odea, MD or one of the following Advanced Practice Providers on your designated Care Team:   Nicolasa Ducking, NP Eula Listen, PA-C Cadence Fransico Michael, PA-C Charlsie Quest, NP     Important Information About Sugar         Signed, Debbe Odea, MD  12/30/2021 10:52 AM    [facility]

## 2021-12-30 NOTE — Patient Instructions (Signed)
Medication Instructions:   Your physician recommends that you continue on your current medications as directed. Please refer to the Current Medication list given to you today.  *If you need a refill on your cardiac medications before your next appointment, please call your pharmacy*    Testing/Procedures:  Your physician has recommended that you wear a Zio XT monitor for 2 weeks. This will be mailed to your home address in 4-5 business days.   Your clinician has requested a Zio heart rhythm monitor by [employer] to be mailed to your home for you to wear for 14 days. You should expect a small box to arrive via USPS (or FedEx in some cases) within this next week. If you do not receive it please call [employer] at [phone].  Closely watching your heart at this time will help your care team understand more and provide information needed to develop your plan of care.  Please apply your Zio patch monitor the day you receive it. Keep this packaging, you will use this to return your Zio monitor.  You will easily be able to apply the monitor with the instructions provided in the Patient Guide.  If you need assistance, [employer] representatives are available 24/7 at [phone].  You can also download the [facility] app on your phone to view detailed application instructions and log symptoms.  After you wear your monitor for 14 days, place it back in the blue box or envelope, along with your Symptom Log.  To send your monitor back: Simply use the pre-addressed and pre-paid box/envelope.  Send it back through [employer] the same day you remove it via your local post office or by placing it in your mailbox.  As soon as we receive the results, they will be reviewed and your clinician will contact you.  For the first 24 hours- it is essential to not shower or exercise, to allow the patch to adhere to your skin. Avoid excessive sweating to help maximize wear time. Do not submerge the device, no hot  tubs, and no swimming pools. Keep any lotions or oils away from the patch. After 24 hours you may shower with the patch on. Take brief showers with your back facing the shower head.  Do not remove patch once it has been placed because that will interrupt data and decrease adhesive wear time. Push the button when you have any symptoms and write down what you were feeling. Once you have completed wearing your monitor, remove and place into box which has postage paid and place in your outgoing mailbox.  If for some reason you have misplaced your box then call our office and we can provide another box and/or mail it off for you.   Follow-Up: At [facility], you and your health needs are our priority.  As part of our continuing mission to provide you with exceptional heart care, we have created designated Provider Care Teams.  These Care Teams include your primary Cardiologist (physician) and Advanced Practice Providers (APPs -  Physician Assistants and Nurse Practitioners) who all work together to provide you with the care you need, when you need it.  We recommend signing up for the patient portal called "MyChart".  Sign up information is provided on this After Visit Summary.  MyChart is used to connect with patients for Virtual Visits (Telemedicine).  Patients are able to view lab/test results, encounter notes, upcoming appointments, etc.  Non-urgent messages can be sent to your provider as well.   To learn  more about what you can do with MyChart, go to [email or web address].    Your next appointment:   3 month(s)  The format for your next appointment:   In Person  Provider:   You may see Debbe Odea, MD or one of the following Advanced Practice Providers on your designated Care Team:   Nicolasa Ducking, NP Eula Listen, PA-C Cadence Fransico Michael, PA-C Charlsie Quest, NP     Important Information About Sugar

## 2022-01-01 DIAGNOSIS — I480 Paroxysmal atrial fibrillation: Secondary | ICD-10-CM

## 2022-01-02 NOTE — Telephone Encounter (Signed)
Patient dropped off forms  ?Placed in nurse box ?

## 2022-01-03 ENCOUNTER — Other Ambulatory Visit: Payer: Self-pay

## 2022-01-03 MED ORDER — AMLODIPINE BESYLATE 5 MG PO TABS: 5.0000 mg | ORAL_TABLET | Freq: Every day | ORAL | 3 refills | Status: DC

## 2022-01-03 NOTE — Addendum Note (Signed)
Addended by: Kendrick Fries on: 01/03/2022 08:10 AM   Modules accepted: Orders

## 2022-01-07 ENCOUNTER — Other Ambulatory Visit: Payer: Self-pay

## 2022-01-07 DIAGNOSIS — I480 Paroxysmal atrial fibrillation: Secondary | ICD-10-CM

## 2022-01-07 MED ORDER — APIXABAN 5 MG PO TABS: 5.0000 mg | ORAL_TABLET | Freq: Two times a day (BID) | ORAL | 3 refills | Status: DC

## 2022-01-07 NOTE — Telephone Encounter (Signed)
Completed Eliquis PAF faxed to [employer].

## 2022-01-09 ENCOUNTER — Ambulatory Visit (INDEPENDENT_AMBULATORY_CARE_PROVIDER_SITE_OTHER): Payer: Medicare HMO | Admitting: Adult Health

## 2022-01-14 ENCOUNTER — Telehealth: Payer: Self-pay | Admitting: Cardiology

## 2022-01-14 NOTE — Telephone Encounter (Signed)
Patient is requesting to speak with [facility] regarding paperwork she dropped off about 2 weeks ago--she would like a status update.

## 2022-01-15 NOTE — Telephone Encounter (Signed)
Called [employer] and was informed that the patient was denied approval for not meeting the out of pocket drug expenses. Patient would need to spend $469.82 on drug expenses. If we can fax proof of this, they will re-evaluate application.  Called patient and left a detailed VM per DPR on file with the above information. Encouraged her to call back with any questions or concerns.

## 2022-01-15 NOTE — Telephone Encounter (Signed)
Called [employer] and was informed that the patient was denied approval for not meeting the out of pocket drug expenses. Patient would need to spend $469.82 on drug expenses. If we can fax proof of this, they will re-evaluate application.

## 2022-01-16 ENCOUNTER — Ambulatory Visit (INDEPENDENT_AMBULATORY_CARE_PROVIDER_SITE_OTHER): Payer: Medicare HMO

## 2022-01-16 DIAGNOSIS — Z23 Encounter for immunization: Secondary | ICD-10-CM

## 2022-01-16 NOTE — Telephone Encounter (Signed)
Patient dropped off information requested placed in box.

## 2022-01-17 ENCOUNTER — Telehealth: Payer: Self-pay | Admitting: Cardiology

## 2022-01-17 DIAGNOSIS — M1990 Unspecified osteoarthritis, unspecified site: Secondary | ICD-10-CM | POA: Diagnosis not present

## 2022-01-17 DIAGNOSIS — I4891 Unspecified atrial fibrillation: Secondary | ICD-10-CM | POA: Diagnosis not present

## 2022-01-17 DIAGNOSIS — E119 Type 2 diabetes mellitus without complications: Secondary | ICD-10-CM | POA: Diagnosis not present

## 2022-01-17 DIAGNOSIS — I110 Hypertensive heart disease with heart failure: Secondary | ICD-10-CM | POA: Diagnosis not present

## 2022-01-17 DIAGNOSIS — R69 Illness, unspecified: Secondary | ICD-10-CM | POA: Diagnosis not present

## 2022-01-17 DIAGNOSIS — G4733 Obstructive sleep apnea (adult) (pediatric): Secondary | ICD-10-CM | POA: Diagnosis not present

## 2022-01-17 DIAGNOSIS — E785 Hyperlipidemia, unspecified: Secondary | ICD-10-CM | POA: Diagnosis not present

## 2022-01-17 DIAGNOSIS — Z008 Encounter for other general examination: Secondary | ICD-10-CM | POA: Diagnosis not present

## 2022-01-17 DIAGNOSIS — E559 Vitamin D deficiency, unspecified: Secondary | ICD-10-CM | POA: Diagnosis not present

## 2022-01-17 DIAGNOSIS — D6869 Other thrombophilia: Secondary | ICD-10-CM | POA: Diagnosis not present

## 2022-01-17 DIAGNOSIS — K219 Gastro-esophageal reflux disease without esophagitis: Secondary | ICD-10-CM | POA: Diagnosis not present

## 2022-01-17 DIAGNOSIS — I509 Heart failure, unspecified: Secondary | ICD-10-CM | POA: Diagnosis not present

## 2022-01-17 NOTE — Telephone Encounter (Signed)
patient is requesting to speak with [facility]

## 2022-01-20 ENCOUNTER — Telehealth: Payer: Self-pay

## 2022-01-20 NOTE — Progress Notes (Signed)
Chronic Care Management Pharmacy Assistant   Name: Regina Christensen  MRN: [id number] DOB: 12-03-1955  Reason for Encounter: Initial Visit Assessment for telephone appointment with Junius Argyle, CPP on 01/22/2022 @ 0900  Patient Assistance Application for Ozempic started   Conditions to be addressed/monitored: CHF, HTN, DMII, Anxiety, Depression, GERD, and OSA, BPPV, Bilateral Carpal Tunnel Syndrome, Morbid Obesity, Insomnia due to Mental Condition, History of CVA, Vitamin D Deficiency, Bilateral lower extremity edema,  Primary concerns for visit include: Patient stated she has a financial burden when it comes to her Ozempic. Dr. Loleta Books recently sent in her prescription for this medication, but per patient Dr. Ancil Boozer is the provider that started her on the [place]. PAP started and e-mailed to CPP as the patient reports she can come to the clinic and pick up the application on Friday.   Recent office visits:  10/18/2021 Steele Sizer, MD (PCP Office Visit) for Welcome to Medicare- Stopped: Neomycin due to patient not taking, Bone Density order placed,   09/10/2021 Steele Sizer, MD (PCP Office Visit) for Follow-up- Stopped: Benzonatate 100 mg, Dextromethorphan 30 mg, Oxycodone 5 mg due to patient completing course, No orders placed, Patient instructed to follow-up in 6 months  Recent consult visits:  09/11/20223 Brain Agbor-Etang, MD (Cardiology) for Follow-up- No medication changes noted, EKG order placed, Referral to Cardiac Electrophysiology order placed, Patient to follow-up in 3 months  12/18/2021  Mina Marble, NP ([facility]) for Follow-up- No medication changes noted, No orders placed, Return in 4 weeks  11/28/2021 Brain Agbor-Etang, MD (Cardiology) for Follow-up- No medication changes noted, EKG order placed, patient to follow-up in 1 year  11/14/2021 Mina Marble, NP ([facility]) for Type II DM- No medication changes noted, No orders placed,  Patient to follow-up in 4 weeks  09/27/2021 Ursula Alert, MD ([place]) I did not break the glass so I am unsure of any changes  09/25/2021 Mina Marble, NP ([facility]) for Vitamin D Deficiency- No medication changes noted, No orders placed, Patient to follow-up in 3 weeks  09/24/2021 Brain Agbor-Etang, MD (Cardiology) for AFIB- Stopped: Mometasone Furoate 220 mcg due to patient not taking, Changed: Decreased Amlodipine to 5 mg daily,  EKG order placed, Patient to follow-up in   08/28/2021 Mina Marble, NP ([facility]) for Type II DM- Stopped: Amoxicillin 500 mg due to patient completing course, Lab orders placed, Patient to follow-up in 4 weeks  08/06/2021 Abby Potash, PA-C ([facility]) for Type II DM- Stopped: Tirzepatide 5 mg, No orders placed, Patient to follow-up in 4 weeks  Hospital visits:  None in previous 6 months  Medications: Outpatient Encounter Medications as of 01/20/2022  Medication Sig Note   acetaminophen (TYLENOL) 650 MG CR tablet Take 1 tablet (650 mg total) by mouth every 8 (eight) hours as needed for pain.    amLODipine (NORVASC) 5 MG tablet Take 1 tablet (5 mg total) by mouth daily.    apixaban (ELIQUIS) 5 MG TABS tablet Take 1 tablet (5 mg total) by mouth 2 (two) times daily.    atorvastatin (LIPITOR) 10 MG tablet Take 1 tablet (10 mg total) by mouth daily.    famotidine (PEPCID) 20 MG tablet Take 1 tablet (20 mg total) by mouth daily.    furosemide (LASIX) 20 MG tablet Take 1 tablet (20 mg total) by mouth daily as needed.    hydrOXYzine (ATARAX) 25 MG tablet TAKE 0.5-1 TABLETS (12.5-25 MG TOTAL) BY MOUTH AT BEDTIME AS NEEDED. FOR  SLEEP    meclizine (ANTIVERT) 12.5 MG tablet Take 1 tablet (12.5 mg total) by mouth 3 (three) times daily as needed for dizziness. 07/16/2021: PRN   methocarbamol (ROBAXIN) 500 MG tablet Take 500 mg by mouth at bedtime. 12/30/2021: PRN   metoprolol tartrate (LOPRESSOR) 100 MG tablet TAKE  1 TABLET BY MOUTH TWICE A DAY    olmesartan (BENICAR) 40 MG tablet Take 1 tablet (40 mg total) by mouth daily.    potassium chloride (KLOR-CON M) 10 MEQ tablet Take 2 tablets (20 mEq total) by mouth daily. Take with Lasix 12/30/2021: PRN with lasix   Semaglutide, 2 MG/DOSE, (OZEMPIC, 2 MG/DOSE,) 8 MG/3ML SOPN Inject 2 mg into the skin once a week.    tiZANidine (ZANAFLEX) 4 MG tablet Take 4 mg by mouth as needed.    triamcinolone (NASACORT) 55 MCG/ACT AERO nasal inhaler Place 2 sprays into the nose daily.    venlafaxine XR (EFFEXOR-XR) 37.5 MG 24 hr capsule Take 1 capsule (37.5 mg total) by mouth daily with breakfast. Take along with 75 mg daily    venlafaxine XR (EFFEXOR-XR) 75 MG 24 hr capsule Take 1 capsule (75 mg total) by mouth daily with breakfast. Take along with 37.5 mg daily    Vitamin D, Ergocalciferol, (DRISDOL) 1.25 MG (50000 UNIT) CAPS capsule Take 1 capsule (50,000 Units total) by mouth every 7 (seven) days.    XIIDRA 5 % SOLN Place 1 drop into both eyes 2 (two) times daily. 07/16/2021: PRN   No facility-administered encounter medications on file as of 01/20/2022.   Care Gaps: Diabetic Eye Exam Diabetic Kidney Evaluation  Star Rating Drugs: Atorvastatin 10 mg Olmesartan 40 mg Ozempic 2 mg  Questions for Clinical Pharmacist:   1.Are you able to connect with Patient ? Yes    2.Confirmed appointment date/time with patient/caregiver? Confirm appointment on 01/22/2022 at 0900 with Fenton Foy, CPP.   3.Visit type telephone   4.Patient/Caregiver instructed to bring medications to appointment. Patient is aware to bring all medications and supplements to appointment   5.What, if any, problems do you have getting your medications from the pharmacy? Financial barriers   6.What is your top health concern to discuss at your upcoming visit? Patient reports her only concern at this time is making sure she can continue on Ozempic   7.Have you seen any other providers since your  last visit? No  Per patient she checks her blood sugars every other day. Patient stated that she does try to watch her sugar and carb intake. Patient doesn't have a exercise regiment, but she states she does try to move around as much as she can. Patient stated she does think her blood sugars drop sometimes as she get's shaky but when she eats she starts to feel better.  Adelene Idler, CPA/CMA Clinical Pharmacist Assistant Phone: [phone]

## 2022-01-21 NOTE — Progress Notes (Unsigned)
Chronic Care Management Pharmacy Note  01/22/2022 Name:  Regina Christensen MRN:  [id number] DOB:  09-Apr-1956  Summary: Patient presents for initial CCM consult. Patient with affordability concerns now that she is in the [facility]. -Eliquis PAP completed by Cardiology  Recommendations/Changes made from today's visit: Start PAP for Ozempic Patient decided to: Utilize [facility] for medication synchronization, packaging and delivery  Plan: CPP follow-up 3 months  Subjective: Regina Christensen is an 66 y.o. year old female who is a primary patient of Steele Sizer, MD.  The CCM team was consulted for assistance with disease management and care coordination needs.    Engaged with patient by telephone for initial visit in response to provider referral for pharmacy case management and/or care coordination services.   Consent to Services:  The patient was given the following information about Chronic Care Management services today, agreed to services, and gave verbal consent: 1. CCM service includes personalized support from designated clinical staff supervised by the primary care provider, including individualized plan of care and coordination with other care providers 2. 24/7 contact phone numbers for assistance for urgent and routine care needs. 3. Service will only be billed when office clinical staff spend 20 minutes or more in a month to coordinate care. 4. Only one practitioner may furnish and bill the service in a calendar month. 5.The patient may stop CCM services at any time (effective at the end of the month) by phone call to the office staff. 6. The patient will be responsible for cost sharing (co-pay) of up to 20% of the service fee (after annual deductible is met). Patient agreed to services and consent obtained.  Patient Care Team: Steele Sizer, MD as PCP - General (Family Medicine) Lovell Sheehan, MD as Consulting Physician (Orthopedic Surgery) Kate Sable,  MD as Consulting Physician (Cardiology) Germaine Pomfret, [facility] (Pharmacist)  Recent office visits: 10/18/2021 Steele Sizer, MD (PCP Office Visit) for Welcome to Medicare- Stopped: Neomycin due to patient not taking, Bone Density order placed,    09/10/2021 Steele Sizer, MD (PCP Office Visit) for Follow-up- Stopped: Benzonatate 100 mg, Dextromethorphan 30 mg, Oxycodone 5 mg due to patient completing course, No orders placed, Patient instructed to follow-up in 6 months  Recent consult visits: 09/11/20223 Brain Agbor-Etang, MD (Cardiology) for Follow-up- No medication changes noted, EKG order placed, Referral to Cardiac Electrophysiology order placed, Patient to follow-up in 3 months   12/18/2021  Mina Marble, NP ([facility]) for Follow-up- No medication changes noted, No orders placed, Return in 4 weeks   11/28/2021 Brain Agbor-Etang, MD (Cardiology) for Follow-up- No medication changes noted, EKG order placed, patient to follow-up in 1 year   11/14/2021 Mina Marble, NP ([facility]) for Type II DM- No medication changes noted, No orders placed, Patient to follow-up in 4 weeks   09/27/2021 Ursula Alert, MD (Behavioral Health) I did not break the glass so I am unsure of any changes   09/25/2021 Mina Marble, NP ([facility]) for Vitamin D Deficiency- No medication changes noted, No orders placed, Patient to follow-up in 3 weeks   09/24/2021 Brain Agbor-Etang, MD (Cardiology) for AFIB- Stopped: Mometasone Furoate 220 mcg due to patient not taking, Changed: Decreased Amlodipine to 5 mg daily,  EKG order placed, Patient to follow-up in    08/28/2021 Mina Marble, NP ([facility]) for Type II DM- Stopped: Amoxicillin 500 mg due to patient completing course, Lab orders placed, Patient to follow-up in 4 weeks  08/06/2021 Abby Potash, PA-C ([facility]) for Type II DM- Stopped: Tirzepatide 5 mg, No orders placed, Patient  to follow-up in 4 weeks  Hospital visits: None in previous 6 months   Objective:  Lab Results  Component Value Date   CREATININE 0.70 08/28/2021   BUN 10 08/28/2021   EGFR 96 08/28/2021   GFRNONAA >60 08/20/2020   GFRAA 97 11/29/2019   NA 145 (H) 08/28/2021   K 3.6 08/28/2021   CALCIUM 9.3 08/28/2021   CO2 26 08/28/2021   GLUCOSE 88 08/28/2021    Lab Results  Component Value Date/Time   HGBA1C 5.8 (H) 08/28/2021 09:04 AM   HGBA1C 5.6 03/13/2021 07:44 AM   HGBA1C 6.0 (H) 08/16/2020 04:53 AM    Last diabetic Eye exam: No results found for: "HMDIABEYEEXA"  Last diabetic Foot exam: No results found for: "HMDIABFOOTEX"   Lab Results  Component Value Date   CHOL 130 03/13/2021   HDL 68 03/13/2021   LDLCALC 46 03/13/2021   TRIG 77 03/13/2021   CHOLHDL 1.9 03/13/2021       Latest Ref Rng & Units 08/28/2021    9:04 AM 03/13/2021    8:27 AM 11/14/2020    9:10 AM  Hepatic Function  Total Protein 6.0 - 8.5 g/dL 6.9  6.6  7.1   Albumin 3.8 - 4.8 g/dL 4.0   4.1   AST 0 - 40 IU/L '15  13  9   ' ALT 0 - 32 IU/L '12  11  9   ' Alk Phosphatase 44 - 121 IU/L 100   111   Total Bilirubin 0.0 - 1.2 mg/dL 0.3  0.4  <0.2     Lab Results  Component Value Date/Time   TSH 1.250 11/14/2020 09:10 AM   TSH 1.358 03/01/2020 03:06 AM   TSH 1.05 11/29/2019 03:12 PM       Latest Ref Rng & Units 03/13/2021    8:27 AM 11/14/2020    9:10 AM 10/03/2020    8:26 AM  CBC  WBC 3.8 - 10.8 Thousand/uL 7.9  9.7  8.7   Hemoglobin 11.7 - 15.5 g/dL 12.4  12.9  12.0   Hematocrit 35.0 - 45.0 % 38.8  40.9  36.8   Platelets 140 - 400 Thousand/uL 382  342  283     Lab Results  Component Value Date/Time   VD25OH 38.1 08/28/2021 09:04 AM   VD25OH 37.3 11/14/2020 09:10 AM    Clinical ASCVD: Yes  The ASCVD Risk score (Arnett DK, et al., 2019) failed to calculate for the following reasons:   The patient has a prior MI or stroke diagnosis       10/18/2021    8:04 AM 09/27/2021    8:41 AM 09/10/2021     7:44 AM  Depression screen PHQ 2/9  Decreased Interest 0 0 0  Down, Depressed, Hopeless 0 0 0  PHQ - 2 Score 0 0 0  Altered sleeping 0  0  Tired, decreased energy 0  0  Change in appetite 0  0  Feeling bad or failure about yourself  0  0  Trouble concentrating 0  0  Moving slowly or fidgety/restless 0  0  Suicidal thoughts 0  0  PHQ-9 Score 0  0    -Last DEXA Scan: 10/28/21   T-Score femoral neck: -1.5  T-Score lumbar spine: +2.0  T-Score forearm radius: +1.2  10-year probability of major osteoporotic fracture: 3.5%  10-year probability of hip fracture: 0.3%  Social  History   Tobacco Use  Smoking Status Never  Smokeless Tobacco Never   BP Readings from Last 3 Encounters:  12/30/21 120/78  12/18/21 132/84  11/28/21 122/70   Pulse Readings from Last 3 Encounters:  12/30/21 73  12/18/21 80  11/28/21 80   Wt Readings from Last 3 Encounters:  12/30/21 260 lb (117.9 kg)  12/18/21 256 lb (116.1 kg)  11/28/21 257 lb 9.6 oz (116.8 kg)   BMI Readings from Last 3 Encounters:  12/30/21 39.53 kg/m  12/18/21 38.92 kg/m  11/28/21 39.17 kg/m    Assessment/Interventions: Review of patient past medical history, allergies, medications, health status, including review of consultants reports, laboratory and other test data, was performed as part of comprehensive evaluation and provision of chronic care management services.   SDOH:  (Social Determinants of Health) assessments and interventions performed: Yes SDOH Interventions    Flowsheet Row Chronic Care Management from 01/22/2022 in [facility] Office Visit from 03/30/2019 in [facility] Office Visit from 03/02/2019 in [facility] Office Visit from 06/21/2018 in [facility] Office Visit from 06/17/2018 in [facility] Office Visit from 07/10/2017 in [facility]  SDOH Interventions        Transportation Interventions  Intervention Not Indicated -- -- -- -- --  Depression Interventions/Treatment  -- Medication Medication Currently on Treatment Currently on Treatment Currently on Treatment  Financial Strain Interventions Other (Comment)  [PAP] -- -- -- -- --      SDOH Screenings   Food Insecurity: No Food Insecurity (10/18/2021)  Housing: Low Risk  (10/18/2021)  Transportation Needs: No Transportation Needs (01/22/2022)  Alcohol Screen: Low Risk  (10/18/2021)  Depression (PHQ2-9): Low Risk  (10/18/2021)  Financial Resource Strain: High Risk (01/22/2022)  Physical Activity: Insufficiently Active (10/18/2021)  Social Connections: Moderately Integrated (10/18/2021)  Stress: No Stress Concern Present (10/18/2021)  Tobacco Use: Low Risk  (12/30/2021)    CCM Care Plan  Allergies  Allergen Reactions   Hydrocodone-Acetaminophen Other (See Comments)    Extreme headache   Ace Inhibitors Cough   Hctz [Hydrochlorothiazide] Rash    face    Medications Reviewed Today     Reviewed by Germaine Pomfret, [facility] (Pharmacist) on 01/22/22 at 1143  Med List Status: <None>   Medication Order Taking? Sig Documenting Provider Last Dose Status Informant  acetaminophen (TYLENOL) 650 MG CR tablet [id number]  Take 1 tablet (650 mg total) by mouth every 8 (eight) hours as needed for pain. Eugenie Filler, MD  Active   amLODipine (NORVASC) 5 MG tablet [id number] Yes Take 1 tablet (5 mg total) by mouth daily. Kate Sable, MD Taking Active   apixaban (ELIQUIS) 5 MG TABS tablet [id number] Yes Take 1 tablet (5 mg total) by mouth 2 (two) times daily. Kate Sable, MD Taking Active   atorvastatin (LIPITOR) 10 MG tablet [id number] Yes Take 1 tablet (10 mg total) by mouth daily. Steele Sizer, MD Taking Active   famotidine (PEPCID) 20 MG tablet [id number] Yes Take 1 tablet (20 mg total) by mouth daily.  Patient taking differently: Take 20 mg by mouth daily as needed for indigestion.   Steele Sizer, MD Taking Active    furosemide (LASIX) 20 MG tablet [id number] Yes Take 1 tablet (20 mg total) by mouth daily as needed. Abby Potash, PA-C Taking Active   gabapentin (NEURONTIN) 100 MG capsule [id number] Yes Take 100 mg by mouth at bedtime. [provider] Taking Active  Med Note Michaelle Birks, Lorraine Terriquez A   Wed Jan 22, 2022  9:46 AM) Prescribed by Dr. Kayleen Memos  hydrOXYzine (ATARAX) 25 MG tablet [id number] No TAKE 0.5-1 TABLETS (12.5-25 MG TOTAL) BY MOUTH AT BEDTIME AS NEEDED. FOR SLEEP  Patient not taking: Reported on 01/22/2022   Ursula Alert, MD Not Taking Active   meclizine (ANTIVERT) 12.5 MG tablet [id number]  Take 1 tablet (12.5 mg total) by mouth 3 (three) times daily as needed for dizziness. Steele Sizer, MD  Active            Med Note (JEFFRIES, Glenice Laine Jul 16, 2021  7:50 AM) PRN  metoprolol tartrate (LOPRESSOR) 100 MG tablet [id number] Yes TAKE 1 TABLET BY MOUTH TWICE A Bernette Mayers, Drue Stager, MD Taking Active   Multiple Vitamin (MULTIVITAMIN) tablet [id number] Yes Take 1 tablet by mouth daily. BariatricPal Multivitamin [provider] Taking Active   olmesartan (BENICAR) 40 MG tablet [id number] Yes Take 1 tablet (40 mg total) by mouth daily. Steele Sizer, MD Taking Active   potassium chloride (KLOR-CON M) 10 MEQ tablet [id number] Yes Take 2 tablets (20 mEq total) by mouth daily. Take with Lasix Abby Potash, PA-C Taking Active            Med Note Eugenio Hoes, MARINA C   Mon Dec 30, 2021  9:45 AM) PRN with lasix  Semaglutide, 2 MG/DOSE, (OZEMPIC, 2 MG/DOSE,) 8 MG/3ML SOPN [id number] Yes Inject 2 mg into the skin once a week. Mina Marble D, NP Taking Active   triamcinolone (NASACORT) 55 MCG/ACT AERO nasal inhaler [id number] Yes Place 2 sprays into the nose daily. [provider] Taking Active Self  venlafaxine XR (EFFEXOR-XR) 37.5 MG 24 hr capsule [id number] Yes Take 1 capsule (37.5 mg total) by mouth daily with breakfast. Take along with 75 mg daily Eappen, Ria Clock, MD  Taking Active   venlafaxine XR (EFFEXOR-XR) 75 MG 24 hr capsule [id number] Yes Take 1 capsule (75 mg total) by mouth daily with breakfast. Take along with 37.5 mg daily Eappen, Ria Clock, MD Taking Active   Vitamin D, Ergocalciferol, (DRISDOL) 1.25 MG (50000 UNIT) CAPS capsule [id number] Yes Take 1 capsule (50,000 Units total) by mouth every 7 (seven) days. Mina Marble D, NP Taking Active   XIIDRA 5 % SOLN [id number]  Place 1 drop into both eyes 2 (two) times daily. [provider]  Active Self           Med Note (JEFFRIES, Glenice Laine Jul 16, 2021  7:52 AM) PRN            Patient Active Problem List   Diagnosis Date Noted   Osteopenia of necks of both femurs 01/22/2022   Bilateral lower extremity edema 09/30/2021   Chronic diastolic heart failure ([place]) 09/10/2021   History of total bilateral knee replacement 09/10/2021   History of blood transfusion 09/10/2020   Current use of long term anticoagulation    Diabetes mellitus ([place])    Paroxysmal atrial fibrillation ([place])    GERD without esophagitis    Vitamin D deficiency 04/12/2020   History of CVA (cerebrovascular accident) without residual deficits 03/05/2020   History of hysterectomy 07/19/2019   Anxiety 07/19/2019   MDD (major depressive disorder), recurrent, in full remission ([place]) 07/19/2019   Insomnia due to mental condition 07/19/2019   Bilateral carpal tunnel syndrome 07/30/2016   OSA (obstructive sleep apnea) 03/07/2016   BPPV (benign paroxysmal positional vertigo) 08/28/2015   Hypertension associated with type 2 diabetes mellitus ([place])  11/09/2014   GAD (generalized anxiety disorder) 11/09/2014   Acanthosis nigricans 11/09/2014   Morbid obesity ([place]) 01/06/2012    Immunization History  Administered Date(s) Administered   Fluad Quad(high Dose 65+) 01/16/2022   Influenza, Quadrivalent, Recombinant, Inj, Pf 01/19/2018   Influenza,inj,Quad PF,6+ Mos 01/11/2016, 12/24/2018, 03/05/2020, 03/13/2021    Influenza-Unspecified 01/30/2017, 01/19/2018   PFIZER(Purple Top)SARS-COV-2 Vaccination 07/17/2019, 08/10/2019, 03/17/2020   PNEUMOCOCCAL CONJUGATE-20 09/10/2021   Pneumococcal Conjugate-13 04/05/2015   Tdap 03/13/2021   Zoster Recombinat (Shingrix) 03/12/2018, 08/17/2018    Conditions to be addressed/monitored:  Hypertension, Hyperlipidemia, Diabetes, Atrial Fibrillation, Heart Failure, GERD, Depression, Anxiety, and Insomnia, Morbid Obesity, History of CVA  Care Plan : General Pharmacy (Adult)  Updates made by Germaine Pomfret, RPH since 01/22/2022 12:00 AM     Problem: Hypertension, Hyperlipidemia, Diabetes, Atrial Fibrillation, Heart Failure, GERD, Depression, Anxiety, and Insomnia, Morbid Obesity, History of CVA   Priority: High     Long-Range Goal: Patient-Specific Goal   Start Date: 01/22/2022  Expected End Date: 01/23/2023  This Visit's Progress: On track  Priority: High  Note:   Current Barriers:  Unable to independently afford treatment regimen  Pharmacist Clinical Goal(s):  Patient will verbalize ability to afford treatment regimen through collaboration with PharmD and provider.   Interventions: 1:1 collaboration with Steele Sizer, MD regarding development and update of comprehensive plan of care as evidenced by provider attestation and co-signature Inter-disciplinary care team collaboration (see longitudinal plan of care) Comprehensive medication review performed; medication list updated in electronic medical record  Heart Failure (Goal: manage symptoms and prevent exacerbations) -Controlled -Last ejection fraction: 55-60% (Date: 2021) -HF type: Diastolic -NYHA Class: I-II (slight limitation of activity) -Current treatment: Amlodipine 5 mg daily  Furosemide 20 mg daily as needed  Metoprolol 100 mg twice daily  Olmesartan 40 mg daily  -Medications previously tried: Valsartan, Benazperil, clonidine, hydralazine, nebivolol, HCTZ,   -Endorses skipping her  potassium sometimes due to pill size.  -Recommended to continue current medication  Atrial Fibrillation (Goal: prevent stroke and major bleeding) -Controlled -History of CVA  -CHADSVASC: 7 -Current treatment: Rate control: Metoprolol 100 mg twice daily  Anticoagulation: Eliquis 5 mg twice daily  -Medications previously tried: NA -PAP completed by Cardiology -Recommended to continue current medication  Hyperlipidemia: (LDL goal < 55) -Controlled -Current treatment: Atorvastatin 10 mg daily  -Medications previously tried: NA  -Patient is candidate for high intensity statin given CVA, would maintain at this dose for now.  -Recommended to continue current medication  Diabetes (A1c goal <7%) -Controlled -Current medications: Ozempic 2 mg weekly  -Medications previously tried: NA  -Current home glucose readings -Denies hypoglycemic/hyperglycemic symptoms -START PAP for Ozempic  -Patient potential candidate for SGLT2 inhibitor, defer given patient hesitation with pill burden  -Recommended to continue current medication  Depression/Anxiety (Goal: Maintain symptom remission) -Controlled -Current treatment: Venlafaxine 37.5 +  75 mg daily  -Medications previously tried/failed: NA -Working on minimizing dose of venlafaxine.  -Recommended to continue current medication  Osteopenia (Goal Prevent fractures) -Controlled -Patient is not a candidate for pharmacologic treatment -Current treatment  Vitamin D 50,000 units weekly  -Medications previously tried: NA   -Recommend 1200 mg of calcium daily from dietary and supplemental sources. -Recommended to continue current medication  Patient Goals/Self-Care Activities Patient will:  - check glucose daily before breakfast, document, and provide at future appointments check blood pressure 2-3 times weekly, document, and provide at future appointments weigh daily, and contact provider if weight gain of greater than 2 pounds in 24  hours  Follow Up Plan: Telephone follow up appointment with care management team member scheduled for:  04/30/2022 at 8:30 AM      Medication Assistance:  Eliquis obtained through [facility] medication assistance program.  Enrollment ends Dec 2023. Via Cardiology  Compliance/Adherence/Medication fill history: Care Gaps: Diabetic Eye Exam - scheduled for December  Diabetic Kidney Evaluation - plan to do at PCP follow-up.  Patient's preferred pharmacy is:  [id number]: [phone]: [phone] Uses pill box? No Pt endorses 80% compliance  We discussed: Verbal consent obtained for [facility] enhanced pharmacy services (medication synchronization, adherence packaging, delivery coordination). A medication sync plan was created to allow patient to get all medications delivered once every 30 to 90 days per patient preference. Patient understands they have freedom to choose pharmacy and clinical pharmacist will coordinate care between all prescribers and [facility].  Patient decided to: Utilize [facility] for medication synchronization, packaging and delivery  Care Plan and Follow Up Patient Decision:  Patient agrees to Care Plan and Follow-up.  Plan: Telephone follow up appointment with care management team member scheduled for:  04/30/2022 at 8:30 AM  AMalva Limes CKeeler FarmPharmacist Practitioner  [phone]

## 2022-01-21 NOTE — Telephone Encounter (Signed)
Received a fax from [employer] stating that the patient has been approved for patient assistance with Eliquis through 04/20/22.  Called patient and informed her of the approval. Patient was very grateful for the assistance and follow up.

## 2022-01-21 NOTE — Telephone Encounter (Signed)
Duplicate encounter see previous tele encounter for PAF/Eliquis on 01/14/22. Closing encounter.

## 2022-01-22 ENCOUNTER — Ambulatory Visit (INDEPENDENT_AMBULATORY_CARE_PROVIDER_SITE_OTHER): Payer: Medicare HMO

## 2022-01-22 DIAGNOSIS — F411 Generalized anxiety disorder: Secondary | ICD-10-CM

## 2022-01-22 DIAGNOSIS — F3342 Major depressive disorder, recurrent, in full remission: Secondary | ICD-10-CM

## 2022-01-22 DIAGNOSIS — M85851 Other specified disorders of bone density and structure, right thigh: Secondary | ICD-10-CM | POA: Insufficient documentation

## 2022-01-22 DIAGNOSIS — I480 Paroxysmal atrial fibrillation: Secondary | ICD-10-CM

## 2022-01-22 DIAGNOSIS — Z8673 Personal history of transient ischemic attack (TIA), and cerebral infarction without residual deficits: Secondary | ICD-10-CM

## 2022-01-22 DIAGNOSIS — I5032 Chronic diastolic (congestive) heart failure: Secondary | ICD-10-CM

## 2022-01-22 DIAGNOSIS — K219 Gastro-esophageal reflux disease without esophagitis: Secondary | ICD-10-CM

## 2022-01-22 DIAGNOSIS — I152 Hypertension secondary to endocrine disorders: Secondary | ICD-10-CM

## 2022-01-22 DIAGNOSIS — E119 Type 2 diabetes mellitus without complications: Secondary | ICD-10-CM

## 2022-01-22 MED ORDER — METOPROLOL TARTRATE 100 MG PO TABS: 100.0000 mg | ORAL_TABLET | Freq: Two times a day (BID) | ORAL | 1 refills | Status: DC

## 2022-01-22 MED ORDER — FUROSEMIDE 20 MG PO TABS: 20.0000 mg | ORAL_TABLET | Freq: Every day | ORAL | 1 refills | Status: DC | PRN

## 2022-01-22 MED ORDER — FAMOTIDINE 20 MG PO TABS: 20.0000 mg | ORAL_TABLET | Freq: Every day | ORAL | 1 refills | Status: DC | PRN

## 2022-01-22 MED ORDER — OLMESARTAN MEDOXOMIL 40 MG PO TABS: 40.0000 mg | ORAL_TABLET | Freq: Every day | ORAL | 1 refills | Status: DC

## 2022-01-22 MED ORDER — ATORVASTATIN CALCIUM 10 MG PO TABS: 10.0000 mg | ORAL_TABLET | Freq: Every day | ORAL | 1 refills | Status: DC

## 2022-01-22 NOTE — Patient Instructions (Signed)
Visit Information It was great speaking with you today!  Please let me know if you have any questions about our visit.   Goals Addressed   None     Patient Care Plan: General Pharmacy (Adult)     Problem Identified: Hypertension, Hyperlipidemia, Diabetes, Atrial Fibrillation, Heart Failure, GERD, Depression, Anxiety, and Insomnia, Morbid Obesity, History of CVA   Priority: High     Long-Range Goal: Patient-Specific Goal   Start Date: 01/22/2022  Expected End Date: 01/23/2023  This Visit's Progress: On track  Priority: High  Note:   Current Barriers:  Unable to independently afford treatment regimen  Pharmacist Clinical Goal(s):  Patient will verbalize ability to afford treatment regimen through collaboration with PharmD and provider.   Interventions: 1:1 collaboration with Steele Sizer, MD regarding development and update of comprehensive plan of care as evidenced by provider attestation and co-signature Inter-disciplinary care team collaboration (see longitudinal plan of care) Comprehensive medication review performed; medication list updated in electronic medical record  Heart Failure (Goal: manage symptoms and prevent exacerbations) -Controlled -Last ejection fraction: 55-60% (Date: 2021) -HF type: Diastolic -NYHA Class: I-II (slight limitation of activity) -Current treatment: Amlodipine 5 mg daily  Furosemide 20 mg daily as needed  Metoprolol 100 mg twice daily  Olmesartan 40 mg daily  -Medications previously tried: Valsartan, Benazperil, clonidine, hydralazine, nebivolol, HCTZ,   -Endorses skipping her potassium sometimes due to pill size.  -Recommended to continue current medication  Atrial Fibrillation (Goal: prevent stroke and major bleeding) -Controlled -History of CVA  -CHADSVASC: 7 -Current treatment: Rate control: Metoprolol 100 mg twice daily  Anticoagulation: Eliquis 5 mg twice daily  -Medications previously tried: NA -PAP completed by  Cardiology -Recommended to continue current medication  Hyperlipidemia: (LDL goal < 55) -Controlled -Current treatment: Atorvastatin 10 mg daily  -Medications previously tried: NA  -Patient is candidate for high intensity statin given CVA, would maintain at this dose for now.  -Recommended to continue current medication  Diabetes (A1c goal <7%) -Controlled -Current medications: Ozempic 2 mg weekly  -Medications previously tried: NA  -Current home glucose readings -Denies hypoglycemic/hyperglycemic symptoms -START PAP for Ozempic  -Patient potential candidate for SGLT2 inhibitor, defer given patient hesitation with pill burden  -Recommended to continue current medication  Depression/Anxiety (Goal: Maintain symptom remission) -Controlled -Current treatment: Venlafaxine 37.5 +  75 mg daily  -Medications previously tried/failed: NA -Working on minimizing dose of venlafaxine.  -Recommended to continue current medication  Osteopenia (Goal Prevent fractures) -Controlled -Patient is not a candidate for pharmacologic treatment -Current treatment  Vitamin D 50,000 units weekly  -Medications previously tried: NA   -Recommend 1200 mg of calcium daily from dietary and supplemental sources. -Recommended to continue current medication  Patient Goals/Self-Care Activities Patient will:  - check glucose daily before breakfast, document, and provide at future appointments check blood pressure 2-3 times weekly, document, and provide at future appointments weigh daily, and contact provider if weight gain of greater than 2 pounds in 24 hours  Follow Up Plan: Telephone follow up appointment with care management team member scheduled for:  04/30/2022 at 8:30 AM    Verbal consent obtained for [facility] enhanced pharmacy services (medication synchronization, adherence packaging, delivery coordination). A medication sync plan was created to allow patient to get all medications delivered  once every 30 to 90 days per patient preference. Patient understands they have freedom to choose pharmacy and clinical pharmacist will coordinate care between all prescribers and [facility].  Ms. Brimage was given information about Chronic Care  Management services today including:  CCM service includes personalized support from designated clinical staff supervised by her physician, including individualized plan of care and coordination with other care providers 24/7 contact phone numbers for assistance for urgent and routine care needs. Standard insurance, coinsurance, copays and deductibles apply for chronic care management only during months in which we provide at least 20 minutes of these services. Most insurances cover these services at 100%, however patients may be responsible for any copay, coinsurance and/or deductible if applicable. This service may help you avoid the need for more expensive face-to-face services. Only one practitioner may furnish and bill the service in a calendar month. The patient may stop CCM services at any time (effective at the end of the month) by phone call to the office staff.  Patient agreed to services and verbal consent obtained.   Patient verbalizes understanding of instructions and care plan provided today and agrees to view in MyChart. Active MyChart status and patient understanding of how to access instructions and care plan via MyChart confirmed with patient.     Cheyenne Adas, CPP Clinical Pharmacist Practitioner  [facility] [phone]

## 2022-01-24 DIAGNOSIS — I480 Paroxysmal atrial fibrillation: Secondary | ICD-10-CM | POA: Diagnosis not present

## 2022-01-28 ENCOUNTER — Telehealth: Payer: Self-pay

## 2022-01-28 NOTE — Progress Notes (Signed)
    Chronic Care Management Pharmacy Assistant   Name: Regina Christensen  MRN: [id number] DOB: 06/26/1955  Reason for Encounter: [name] with [place], CPP sent a task requesting that I complete the onboarding process for the patient to start receiving her medications with [facility].  Ph[facility] Profile [facility] from [facility] to have [facility]  CPP has been notified that Onboarding form has been completed.   Medications: Outpatient Encounter Medications as of 01/28/2022  Medication Sig Note   acetaminophen (TYLENOL) 650 MG CR tablet Take 1 tablet (650 mg total) by mouth every 8 (eight) hours as needed for pain.    amLODipine (NORVASC) 5 MG tablet Take 1 tablet (5 mg total) by mouth daily.    apixaban (ELIQUIS) 5 MG TABS tablet Take 1 tablet (5 mg total) by mouth 2 (two) times daily.    atorvastatin (LIPITOR) 10 MG tablet Take 1 tablet (10 mg total) by mouth daily.    famotidine (PEPCID) 20 MG tablet Take 1 tablet (20 mg total) by mouth daily as needed for indigestion.    furosemide (LASIX) 20 MG tablet Take 1 tablet (20 mg total) by mouth daily as needed.    gabapentin (NEURONTIN) 100 MG capsule Take 100 mg by mouth at bedtime. 01/22/2022: Prescribed by Dr. Kayleen Memos   hydrOXYzine (ATARAX) 25 MG tablet TAKE 0.5-1 TABLETS (12.5-25 MG TOTAL) BY MOUTH AT BEDTIME AS NEEDED. FOR SLEEP (Patient not taking: Reported on 01/22/2022)    meclizine (ANTIVERT) 12.5 MG tablet Take 1 tablet (12.5 mg total) by mouth 3 (three) times daily as needed for dizziness. 07/16/2021: PRN   metoprolol tartrate (LOPRESSOR) 100 MG tablet Take 1 tablet (100 mg total) by mouth 2 (two) times daily.    Multiple Vitamin (MULTIVITAMIN) tablet Take 1 tablet by mouth daily. BariatricPal Multivitamin    olmesartan (BENICAR) 40 MG tablet Take 1 tablet (40 mg total) by mouth daily.    potassium chloride (KLOR-CON M) 10 MEQ  tablet Take 2 tablets (20 mEq total) by mouth daily. Take with Lasix 12/30/2021: PRN with lasix   Semaglutide, 2 MG/DOSE, (OZEMPIC, 2 MG/DOSE,) 8 MG/3ML SOPN Inject 2 mg into the skin once a week.    triamcinolone (NASACORT) 55 MCG/ACT AERO nasal inhaler Place 2 sprays into the nose daily.    venlafaxine XR (EFFEXOR-XR) 37.5 MG 24 hr capsule Take 1 capsule (37.5 mg total) by mouth daily with breakfast. Take along with 75 mg daily    venlafaxine XR (EFFEXOR-XR) 75 MG 24 hr capsule Take 1 capsule (75 mg total) by mouth daily with breakfast. Take along with 37.5 mg daily    Vitamin D, Ergocalciferol, (DRISDOL) 1.25 MG (50000 UNIT) CAPS capsule Take 1 capsule (50,000 Units total) by mouth every 7 (seven) days.    XIIDRA 5 % SOLN Place 1 drop into both eyes 2 (two) times daily. 07/16/2021: PRN   No facility-administered encounter medications on file as of 01/28/2022.    Lynann Bologna, CPA/CMA Clinical Pharmacist Assistant Phone: [phone]

## 2022-02-03 ENCOUNTER — Telehealth: Payer: Self-pay

## 2022-02-03 NOTE — Progress Notes (Signed)
Chronic Care Management Pharmacy Assistant   Name: Regina Christensen  MRN: [id number] DOB: 02-Jul-1955  Reason for Encounter: Patient Assistance Follow-up Ozempic   I received a task from Junius Argyle, CPP requesting I contact [employer] to check the status of the patient assistance application for the patient.  I contacted [employer] and spoke with the representative whom advised they have not received the patient' application at this time. I sent a task to CPP to have him refax the application when he has a chance.   02/07/2022  I contacted [employer] and spoke with a representative that stated as of today they still do not see the application on the patient's profile. The representative did contact her back office team to see if the fax was there and just hasn't been posted to the patient's profile. Per representative after their search they did not find the 2nd fax that we faxed over. The representative provided me with their emergency fax [phone].  CPP notified that fax was not received. Provide emergency fax number and he stated he will fax it over today. The representative requested I check back on Tuesday as it takes 48 hours to enter into their system.  02/11/2022 I spoke with Caryl Pina with [employer] and she stated that the patient does have a profile but she does not see documents attached to her profile. I provided her with the fax number for [facility] that we are faxing from so she can research in her system to see if it's available. I informed her this is the 2nd fax that we have faxed over to them. I also informed her that we faxed the last one over to their emergency fax of [phone]. The representative was unable to locate patient's application. Suggested we fax from a different location. CPP notified.  02/18/2022 I contacted [employer], and I spoke with the representative who advised they did receive the patient's application. The representative placed me on a  brief hold why she reviewed the patient's application. After the representative reviewed the application and processed the patient's application patient has been approved for her Ozempic for the year 2023. They will ship the medication and it should arrive to the clinic in about 14 business days. Application ID [id number].   The patient has been approved until 04/20/2022 and she will receive enough of the medication to get her to the end of the year. The patient will need to fill out a renewal application to continue for the year 2024.  I called the patient, and updated her on her approval. I also informed the patient that she will need to submit the 8756 Renewal application in order to continue receiving her medications next year. I informed the patient that her 1st shipment should arrive to the clinic in 10-14 business days. I informed her that she can bring her 2024 Renewal when she picks up her first shipment. Patient verbalized understanding to all.  Medications: Outpatient Encounter Medications as of 02/03/2022  Medication Sig Note   acetaminophen (TYLENOL) 650 MG CR tablet Take 1 tablet (650 mg total) by mouth every 8 (eight) hours as needed for pain.    amLODipine (NORVASC) 5 MG tablet Take 1 tablet (5 mg total) by mouth daily.    apixaban (ELIQUIS) 5 MG TABS tablet Take 1 tablet (5 mg total) by mouth 2 (two) times daily.    atorvastatin (LIPITOR) 10 MG tablet Take 1 tablet (10 mg total) by mouth daily.  famotidine (PEPCID) 20 MG tablet Take 1 tablet (20 mg total) by mouth daily as needed for indigestion.    furosemide (LASIX) 20 MG tablet Take 1 tablet (20 mg total) by mouth daily as needed.    gabapentin (NEURONTIN) 100 MG capsule Take 100 mg by mouth at bedtime. 01/22/2022: Prescribed by Dr. Noralyn Pick   hydrOXYzine (ATARAX) 25 MG tablet TAKE 0.5-1 TABLETS (12.5-25 MG TOTAL) BY MOUTH AT BEDTIME AS NEEDED. FOR SLEEP (Patient not taking: Reported on 01/22/2022)    meclizine (ANTIVERT) 12.5 MG  tablet Take 1 tablet (12.5 mg total) by mouth 3 (three) times daily as needed for dizziness. 07/16/2021: PRN   metoprolol tartrate (LOPRESSOR) 100 MG tablet Take 1 tablet (100 mg total) by mouth 2 (two) times daily.    Multiple Vitamin (MULTIVITAMIN) tablet Take 1 tablet by mouth daily. BariatricPal Multivitamin    olmesartan (BENICAR) 40 MG tablet Take 1 tablet (40 mg total) by mouth daily.    potassium chloride (KLOR-CON M) 10 MEQ tablet Take 2 tablets (20 mEq total) by mouth daily. Take with Lasix 12/30/2021: PRN with lasix   Semaglutide, 2 MG/DOSE, (OZEMPIC, 2 MG/DOSE,) 8 MG/3ML SOPN Inject 2 mg into the skin once a week.    triamcinolone (NASACORT) 55 MCG/ACT AERO nasal inhaler Place 2 sprays into the nose daily.    venlafaxine XR (EFFEXOR-XR) 37.5 MG 24 hr capsule Take 1 capsule (37.5 mg total) by mouth daily with breakfast. Take along with 75 mg daily    venlafaxine XR (EFFEXOR-XR) 75 MG 24 hr capsule Take 1 capsule (75 mg total) by mouth daily with breakfast. Take along with 37.5 mg daily    Vitamin D, Ergocalciferol, (DRISDOL) 1.25 MG (50000 UNIT) CAPS capsule Take 1 capsule (50,000 Units total) by mouth every 7 (seven) days.    XIIDRA 5 % SOLN Place 1 drop into both eyes 2 (two) times daily. 07/16/2021: PRN   No facility-administered encounter medications on file as of 02/03/2022.    Adelene Idler, CPA/CMA Clinical Pharmacist Assistant Phone: [phone]

## 2022-02-05 ENCOUNTER — Encounter (INDEPENDENT_AMBULATORY_CARE_PROVIDER_SITE_OTHER): Payer: Self-pay | Admitting: Adult Health

## 2022-02-05 ENCOUNTER — Telehealth (INDEPENDENT_AMBULATORY_CARE_PROVIDER_SITE_OTHER): Payer: Medicare HMO | Admitting: Adult Health

## 2022-02-05 VITALS — BP 127/74 | HR 77 | Temp 98.2°F | Ht 68.0 in | Wt 252.0 lb

## 2022-02-05 DIAGNOSIS — I152 Hypertension secondary to endocrine disorders: Secondary | ICD-10-CM

## 2022-02-05 DIAGNOSIS — Z7985 Long-term (current) use of injectable non-insulin antidiabetic drugs: Secondary | ICD-10-CM

## 2022-02-05 DIAGNOSIS — E1159 Type 2 diabetes mellitus with other circulatory complications: Secondary | ICD-10-CM

## 2022-02-05 DIAGNOSIS — E669 Obesity, unspecified: Secondary | ICD-10-CM | POA: Diagnosis not present

## 2022-02-05 DIAGNOSIS — Z6838 Body mass index (BMI) 38.0-38.9, adult: Secondary | ICD-10-CM | POA: Diagnosis not present

## 2022-02-05 DIAGNOSIS — E1169 Type 2 diabetes mellitus with other specified complication: Secondary | ICD-10-CM | POA: Diagnosis not present

## 2022-02-05 DIAGNOSIS — Z6841 Body Mass Index (BMI) 40.0 and over, adult: Secondary | ICD-10-CM

## 2022-02-05 NOTE — Telephone Encounter (Signed)
Copied from [place] [id number]. Topic: General - Other >> Feb 05, 2022  3:12 PM Ludger Nutting wrote: Patient requests a callback from [facility]

## 2022-02-10 ENCOUNTER — Ambulatory Visit (INDEPENDENT_AMBULATORY_CARE_PROVIDER_SITE_OTHER): Payer: Medicare HMO | Admitting: Family Medicine

## 2022-02-10 ENCOUNTER — Encounter: Payer: Self-pay | Admitting: Family Medicine

## 2022-02-10 VITALS — BP 124/82 | HR 77 | Temp 97.9°F | Resp 18 | Ht 68.0 in | Wt 253.1 lb

## 2022-02-10 DIAGNOSIS — I152 Hypertension secondary to endocrine disorders: Secondary | ICD-10-CM

## 2022-02-10 DIAGNOSIS — E1159 Type 2 diabetes mellitus with other circulatory complications: Secondary | ICD-10-CM | POA: Diagnosis not present

## 2022-02-10 DIAGNOSIS — R112 Nausea with vomiting, unspecified: Secondary | ICD-10-CM | POA: Diagnosis not present

## 2022-02-10 DIAGNOSIS — R002 Palpitations: Secondary | ICD-10-CM | POA: Diagnosis not present

## 2022-02-10 DIAGNOSIS — E559 Vitamin D deficiency, unspecified: Secondary | ICD-10-CM | POA: Diagnosis not present

## 2022-02-10 DIAGNOSIS — I480 Paroxysmal atrial fibrillation: Secondary | ICD-10-CM

## 2022-02-10 DIAGNOSIS — Z79899 Other long term (current) drug therapy: Secondary | ICD-10-CM

## 2022-02-10 DIAGNOSIS — E785 Hyperlipidemia, unspecified: Secondary | ICD-10-CM

## 2022-02-10 DIAGNOSIS — R5383 Other fatigue: Secondary | ICD-10-CM | POA: Diagnosis not present

## 2022-02-10 DIAGNOSIS — R0602 Shortness of breath: Secondary | ICD-10-CM | POA: Diagnosis not present

## 2022-02-10 NOTE — Progress Notes (Signed)
Name: Regina Christensen   MRN: [id number]    DOB: October 23, 1955   Date:02/10/2022       Progress Note  Subjective  Chief Complaint  Consult  HPI  Fatigue: she states over the weekend she felt more tired than usual, she was going to stay home today to rest but her children advised her to come in to be evaluated. She states she felt her heart flutter ( she has a history of Afib and it happens at times), no chest pain, she has intermittent SOB that is also stable. No fever, chills, no dysuria, no cold symptoms. Glucose at home today was 99. She states she had a power bowl around 2 pm, she felt hot and vomited food around 5 pm, after that she felt hungry and ate chicken noodle soup, she had loose stools about one after dinner. She felt fine during the night. This morning she had a protein breakfast bowl. She is feeling better today but still has fatigue  Patient Active Problem List   Diagnosis Date Noted   Osteopenia of necks of both femurs 01/22/2022   Bilateral lower extremity edema 09/30/2021   Chronic diastolic heart failure ([place]) 09/10/2021   History of total bilateral knee replacement 09/10/2021   History of blood transfusion 09/10/2020   Current use of long term anticoagulation    Diabetes mellitus ([place])    Paroxysmal atrial fibrillation ([facility])    GERD without esophagitis    Vitamin D deficiency 04/12/2020   History of CVA (cerebrovascular accident) without residual deficits 03/05/2020   History of hysterectomy 07/19/2019   Anxiety 07/19/2019   MDD (major depressive disorder), recurrent, in full remission ([place]) 07/19/2019   Insomnia due to mental condition 07/19/2019   Bilateral carpal tunnel syndrome 07/30/2016   OSA (obstructive sleep apnea) 03/07/2016   BPPV (benign paroxysmal positional vertigo) 08/28/2015   Hypertension associated with type 2 diabetes mellitus ([place]) 11/09/2014   GAD (generalized anxiety disorder) 11/09/2014   Acanthosis nigricans 11/09/2014   Morbid obesity  ([place]) 01/06/2012    Past Surgical History:  Procedure Laterality Date   ABDOMINAL HYSTERECTOMY     BREAST EXCISIONAL BIOPSY Left    ENDOMETRIAL ABLATION     x2   ENDOSCOPIC CONCHA BULLOSA RESECTION Bilateral 08/08/2020   Procedure: ENDOSCOPIC CONCHA BULLOSA RESECTION;  Surgeon: Margaretha Sheffield, MD;  Location: [facility] ORS;  Service: ENT;  Laterality: Bilateral;   ETHMOIDECTOMY Right 08/08/2020   Procedure: ETHMOIDECTOMY, LEFT PARTIAL ETHMOIDECTOMY;  Surgeon: Margaretha Sheffield, MD;  Location: [facility] ORS;  Service: ENT;  Laterality: Right;   IMAGE GUIDED SINUS SURGERY N/A 08/08/2020   Procedure: IMAGE GUIDED SINUS SURGERY, RIGHT FRONTAL SINUSOTOMY;  Surgeon: Margaretha Sheffield, MD;  Location: [facility] ORS;  Service: ENT;  Laterality: N/A;   LAPAROSCOPIC SLEEVE GASTRECTOMY     MAXILLARY ANTROSTOMY Bilateral 08/08/2020   Procedure: MAXILLARY ANTROSTOMY with tissue;  Surgeon: Margaretha Sheffield, MD;  Location: [facility] ORS;  Service: ENT;  Laterality: Bilateral;   NASAL TURBINATE REDUCTION  08/27/2020   Procedure: TURBINATE REDUCTION /SUBMUCOSAL DEBRIDEMENT;  Surgeon: Margaretha Sheffield, MD;  Location: [facility] ORS;  Service: ENT;;   TOTAL KNEE ARTHROPLASTY Right 06/2019   TOTAL KNEE ARTHROPLASTY Left 01/31/2021   [facility]    Family History  Problem Relation Age of Onset   Diabetes Mother    High blood pressure Mother    Heart disease Father    Cancer Brother    Mental illness Neg Hx     Social History   Tobacco Use  Smoking status: Never   Smokeless tobacco: Never  Substance Use Topics   Alcohol use: No    Alcohol/week: 0.0 standard drinks of alcohol    Comment: rare     Current Outpatient Medications:    acetaminophen (TYLENOL) 650 MG CR tablet, Take 1 tablet (650 mg total) by mouth every 8 (eight) hours as needed for pain., Disp: , Rfl:    amLODipine (NORVASC) 5 MG tablet, Take 1 tablet (5 mg total) by mouth daily., Disp: 30 tablet, Rfl: 3   apixaban (ELIQUIS) 5 MG TABS tablet, Take  1 tablet (5 mg total) by mouth 2 (two) times daily., Disp: 180 tablet, Rfl: 3   atorvastatin (LIPITOR) 10 MG tablet, Take 1 tablet (10 mg total) by mouth daily., Disp: 90 tablet, Rfl: 1   famotidine (PEPCID) 20 MG tablet, Take 1 tablet (20 mg total) by mouth daily as needed for indigestion., Disp: 90 tablet, Rfl: 1   furosemide (LASIX) 20 MG tablet, Take 1 tablet (20 mg total) by mouth daily as needed., Disp: 90 tablet, Rfl: 1   gabapentin (NEURONTIN) 100 MG capsule, Take 100 mg by mouth at bedtime., Disp: , Rfl:    hydrOXYzine (ATARAX) 25 MG tablet, TAKE 0.5-1 TABLETS (12.5-25 MG TOTAL) BY MOUTH AT BEDTIME AS NEEDED. FOR SLEEP, Disp: 90 tablet, Rfl: 1   meclizine (ANTIVERT) 12.5 MG tablet, Take 1 tablet (12.5 mg total) by mouth 3 (three) times daily as needed for dizziness., Disp: 30 tablet, Rfl: 0   metoprolol tartrate (LOPRESSOR) 100 MG tablet, Take 1 tablet (100 mg total) by mouth 2 (two) times daily., Disp: 180 tablet, Rfl: 1   Multiple Vitamin (MULTIVITAMIN) tablet, Take 1 tablet by mouth daily. BariatricPal Multivitamin, Disp: , Rfl:    olmesartan (BENICAR) 40 MG tablet, Take 1 tablet (40 mg total) by mouth daily., Disp: 90 tablet, Rfl: 1   potassium chloride (KLOR-CON M) 10 MEQ tablet, Take 2 tablets (20 mEq total) by mouth daily. Take with Lasix, Disp: 60 tablet, Rfl: 0   Semaglutide, 2 MG/DOSE, (OZEMPIC, 2 MG/DOSE,) 8 MG/3ML SOPN, Inject 2 mg into the skin once a week., Disp: 3 mL, Rfl: 0   triamcinolone (NASACORT) 55 MCG/ACT AERO nasal inhaler, Place 2 sprays into the nose daily., Disp: , Rfl:    venlafaxine XR (EFFEXOR-XR) 37.5 MG 24 hr capsule, Take 1 capsule (37.5 mg total) by mouth daily with breakfast. Take along with 75 mg daily, Disp: 90 capsule, Rfl: 1   venlafaxine XR (EFFEXOR-XR) 75 MG 24 hr capsule, Take 1 capsule (75 mg total) by mouth daily with breakfast. Take along with 37.5 mg daily, Disp: 90 capsule, Rfl: 1   Vitamin D, Ergocalciferol, (DRISDOL) 1.25 MG (50000 UNIT) CAPS  capsule, Take 1 capsule (50,000 Units total) by mouth every 7 (seven) days., Disp: 12 capsule, Rfl: 1   XIIDRA 5 % SOLN, Place 1 drop into both eyes 2 (two) times daily., Disp: , Rfl: 4  Allergies  Allergen Reactions   Hydrocodone-Acetaminophen Other (See Comments)    Extreme headache   Ace Inhibitors Cough   Hctz [Hydrochlorothiazide] Rash    face    I personally reviewed active problem list, medication list, allergies, family history, social history, health maintenance with the patient/caregiver today.   ROS  Ten systems reviewed and is negative except as mentioned in HPI   Objective  Vitals:   02/10/22 1144  BP: 124/82  Pulse: 77  Resp: 18  Temp: 97.9 F (36.6 C)  TempSrc: Oral  SpO2:  98%  Weight: 253 lb 1.6 oz (114.8 kg)  Height: 5\' 8"  (1.727 m)    Body mass index is 38.48 kg/m.  Physical Exam  Constitutional: Patient appears well-developed and well-nourished. Obese  No distress.  HEENT: head atraumatic, normocephalic, pupils equal and reactive to light, neck supple Cardiovascular: Normal rate, regular rhythm and normal heart sounds.  No murmur heard. No BLE edema. Pulmonary/Chest: Effort normal and breath sounds normal. No respiratory distress. Abdominal: Soft.  There is no tenderness. Psychiatric: Patient has a normal mood and affect. behavior is normal. Judgment and thought content normal.   PHQ2/9:    02/10/2022   11:50 AM 10/18/2021    8:04 AM 09/27/2021    8:41 AM 09/10/2021    7:44 AM 07/16/2021    7:47 AM  Depression screen PHQ 2/9  Decreased Interest 0 0  0 0  Down, Depressed, Hopeless 0 0  0 0  PHQ - 2 Score 0 0  0 0  Altered sleeping 1 0  0 0  Tired, decreased energy 2 0  0 0  Change in appetite 0 0  0 0  Feeling bad or failure about yourself  0 0  0 0  Trouble concentrating 0 0  0 0  Moving slowly or fidgety/restless 0 0  0 0  Suicidal thoughts 0 0  0 0  PHQ-9 Score 3 0  0 0  Difficult doing work/chores Not difficult at all    Not difficult  at all     Information is confidential and restricted. Go to Review Flowsheets to unlock data.    phq 9 is negative   Fall Risk:    02/10/2022   11:50 AM 10/18/2021    8:04 AM 09/10/2021    7:44 AM 07/16/2021    7:47 AM 04/08/2021    2:34 PM  Fall Risk   Falls in the past year? 0 0 0 0 0  Number falls in past yr:  0 0 0 0  Injury with Fall?  0 0 0 0  Risk for fall due to :  No Fall Risks No Fall Risks  No Fall Risks  Follow up Falls prevention discussed;Education provided;Falls evaluation completed Falls prevention discussed Falls prevention discussed  Falls prevention discussed    Assessment & Plan  1. Other fatigue  - CBC with Differential/Platelet - Troponin I - CK Total (and CKMB)  2. Nausea and vomiting in adult patient  - Troponin I - CK Total (and CKMB)  3. SOBOE (shortness of breath on exertion)  - CBC with Differential/Platelet - Troponin I - CK Total (and CKMB)  4. Hypertension associated with type 2 diabetes mellitus (HCC)  - Lipid panel - Microalbumin / creatinine urine ratio - Hemoglobin A1c  5. Paroxysmal atrial fibrillation (HCC)  Under the care of cardiologist   6. Palpitation   7. Vitamin D deficiency  - VITAMIN D 25 Hydroxy (Vit-D Deficiency, Fractures)  8. Long-term use of high-risk medication  - COMPLETE METABOLIC PANEL WITH GFR - CBC with Differential/Platelet - B12 and Folate Panel  9. Dyslipidemia  - Lipid panel

## 2022-02-11 LAB — HEMOGLOBIN A1C
Hgb A1c MFr Bld: 6 % of total Hgb — ABNORMAL HIGH (ref <5.7)
Mean Plasma Glucose: 126 mg/dL
eAG (mmol/L): 7 mmol/L

## 2022-02-11 LAB — COMPLETE METABOLIC PANEL WITH GFR
AG Ratio: 1.5 (calc) (ref 1.0–2.5)
ALT: 11 U/L (ref 6–29)
AST: 11 U/L (ref 10–35)
Albumin: 4 g/dL (ref 3.6–5.1)
Alkaline phosphatase (APISO): 86 U/L (ref 37–153)
BUN: 11 mg/dL (ref 7–25)
CO2: 29 mmol/L (ref 20–32)
Calcium: 9.8 mg/dL (ref 8.6–10.4)
Chloride: 106 mmol/L (ref 98–110)
Creat: 0.86 mg/dL (ref 0.50–1.05)
Globulin: 2.6 g/dL (calc) (ref 1.9–3.7)
Glucose, Bld: 72 mg/dL (ref 65–99)
Potassium: 4.3 mmol/L (ref 3.5–5.3)
Sodium: 142 mmol/L (ref 135–146)
Total Bilirubin: 0.4 mg/dL (ref 0.2–1.2)
Total Protein: 6.6 g/dL (ref 6.1–8.1)
eGFR: 75 mL/min/{1.73_m2} (ref >=60)

## 2022-02-11 LAB — CBC WITH DIFFERENTIAL/PLATELET
Absolute Monocytes: 638 cells/uL (ref 200–950)
Basophils Absolute: 68 cells/uL (ref 0–200)
Basophils Relative: 0.8 %
Eosinophils Absolute: 111 cells/uL (ref 15–500)
Eosinophils Relative: 1.3 %
HCT: 43.2 % (ref 35.0–45.0)
Hemoglobin: 14.4 g/dL (ref 11.7–15.5)
Lymphs Abs: 1734 cells/uL (ref 850–3900)
MCH: 29.6 pg (ref 27.0–33.0)
MCHC: 33.3 g/dL (ref 32.0–36.0)
MCV: 88.7 fL (ref 80.0–100.0)
MPV: 11.6 fL (ref 7.5–12.5)
Monocytes Relative: 7.5 %
Neutro Abs: 5950 cells/uL (ref 1500–7800)
Neutrophils Relative %: 70 %
Platelets: 310 10*3/uL (ref 140–400)
RBC: 4.87 10*6/uL (ref 3.80–5.10)
RDW: 14.5 % (ref 11.0–15.0)
Total Lymphocyte: 20.4 %
WBC: 8.5 10*3/uL (ref 3.8–10.8)

## 2022-02-11 LAB — MICROALBUMIN / CREATININE URINE RATIO
Creatinine, Urine: 256 mg/dL (ref 20–275)
Microalb Creat Ratio: 60 mcg/mg creat — ABNORMAL HIGH (ref <30)
Microalb, Ur: 15.4 mg/dL

## 2022-02-11 LAB — B12 AND FOLATE PANEL
Folate: 18.3 ng/mL
Vitamin B-12: 699 pg/mL (ref 200–1100)

## 2022-02-11 LAB — CK TOTAL AND CKMB (NOT AT ARMC)
CK, MB: <0.7 ng/mL (ref 0–5.0)
Total CK: 67 U/L (ref 29–143)

## 2022-02-11 LAB — TROPONIN I: Troponin I: <3 ng/L (ref <=47)

## 2022-02-11 LAB — LIPID PANEL
Cholesterol: 112 mg/dL (ref <200)
HDL: 66 mg/dL (ref >=50)
LDL Cholesterol (Calc): 30 mg/dL (calc)
Non-HDL Cholesterol (Calc): 46 mg/dL (calc) (ref <130)
Total CHOL/HDL Ratio: 1.7 (calc) (ref <5.0)
Triglycerides: 79 mg/dL (ref <150)

## 2022-02-11 LAB — VITAMIN D 25 HYDROXY (VIT D DEFICIENCY, FRACTURES): Vit D, 25-Hydroxy: 60 ng/mL (ref 30–100)

## 2022-02-12 ENCOUNTER — Other Ambulatory Visit: Payer: Self-pay

## 2022-02-12 ENCOUNTER — Telehealth (INDEPENDENT_AMBULATORY_CARE_PROVIDER_SITE_OTHER): Payer: Medicare HMO | Admitting: Nurse Practitioner

## 2022-02-12 ENCOUNTER — Encounter: Payer: Self-pay | Admitting: Nurse Practitioner

## 2022-02-12 DIAGNOSIS — J069 Acute upper respiratory infection, unspecified: Secondary | ICD-10-CM

## 2022-02-12 MED ORDER — BENZONATATE 100 MG PO CAPS: 200.0000 mg | ORAL_CAPSULE | Freq: Two times a day (BID) | ORAL | 0 refills | Status: DC | PRN

## 2022-02-12 MED ORDER — PREDNISONE 10 MG (21) PO TBPK: ORAL_TABLET | ORAL | 0 refills | Status: DC

## 2022-02-12 NOTE — Progress Notes (Unsigned)
Chief Complaint:   OBESITY Regina Christensen is here to discuss her progress with her obesity treatment plan along with follow-up of her obesity related diagnoses. Regina Christensen is on the Category 1 Plan and states she is following her eating plan approximately 85% of the time. Regina Christensen states she is doing very little.   Today's visit was #: 57 Starting weight: 255 lbs Starting date: 05/10/2020 Today's weight: 252 lbs Today's date: 02/05/2022 Total lbs lost to date: 3 lbs Total lbs lost since last in-office visit: 4 lbs  Interim History: last office visit with Regina Christensen, 12/18/2021.  Ozempic ***  Subjective:   1. Hypertension associated with type 2 diabetes mellitus ([place]) Blood pressure at goal.  She is currently on amlodipine 5 mg daily, metoprolol tartrate 100 mg BID, Olmesartan 40 mg daily.   2. Type 2 diabetes mellitus with other specified complication, without long-term current use of insulin ([facility]) Due to Medicare D prescription coverage, she has been unable to afford last refill of Ozempic, but she has been able to remain on weekly Ozempic 2 mg due to extra home supply.  She was instructed to call her insurance company and inquire cost of Rybelsus and contact us with information.  ***, PCP is working with her for medication assistance program, Pharm D to obtain future refills of Ozempic.  Fasting blood glucose is 90-100.  Assessment/Plan:   1. Hypertension associated with type 2 diabetes mellitus ([place]) Continue current antihypertensive therapy. Follow up with PCP/Pharm D.  2. Type 2 diabetes mellitus with other specified complication, without long-term current use of insulin ([place]) Follow up with PCP , Pharm D.   3. Obesity, current BMI 38.4 Handouts:  Holiday recipes and tips.  Regina Christensen is currently in the action stage of change. As such, her goal is to continue with weight loss efforts. She has agreed to the Category 1 Plan.   Exercise goals:  As is.   Behavioral modification  strategies: increasing lean protein intake, decreasing simple carbohydrates, keeping healthy foods in the home, ways to avoid boredom eating, and planning for success.  Regina Christensen has agreed to follow-up with our clinic in 4 weeks. She was informed of the importance of frequent follow-up visits to maximize her success with intensive lifestyle modifications for her multiple health conditions.   Objective:   Blood pressure 127/74, pulse 77, temperature 98.2 F (36.8 C), height 5\' 8"  (1.727 m), weight 252 lb (114.3 kg), SpO2 96 %. Body mass index is 38.32 kg/m.  General: Cooperative, alert, well developed, in no acute distress. HEENT: Conjunctivae and lids unremarkable. Cardiovascular: Regular rhythm.  Lungs: Normal work of breathing. Neurologic: No focal deficits.   Lab Results  Component Value Date   CREATININE 0.86 02/10/2022   BUN 11 02/10/2022   NA 142 02/10/2022   K 4.3 02/10/2022   CL 106 02/10/2022   CO2 29 02/10/2022   Lab Results  Component Value Date   ALT 11 02/10/2022   AST 11 02/10/2022   ALKPHOS 100 08/28/2021   BILITOT 0.4 02/10/2022   Lab Results  Component Value Date   HGBA1C 6.0 (H) 02/10/2022   HGBA1C 5.8 (H) 08/28/2021   HGBA1C 5.6 03/13/2021   HGBA1C 6.0 (H) 08/16/2020   HGBA1C 6.2 (H) 03/01/2020   Lab Results  Component Value Date   INSULIN 8.0 08/28/2021   Lab Results  Component Value Date   TSH 1.250 11/14/2020   Lab Results  Component Value Date   CHOL 112 02/10/2022   HDL 66  02/10/2022   LDLCALC 30 02/10/2022   TRIG 79 02/10/2022   CHOLHDL 1.7 02/10/2022   Lab Results  Component Value Date   VD25OH 60 02/10/2022   VD25OH 38.1 08/28/2021   VD25OH 37.3 11/14/2020   Lab Results  Component Value Date   WBC 8.5 02/10/2022   HGB 14.4 02/10/2022   HCT 43.2 02/10/2022   MCV 88.7 02/10/2022   PLT 310 02/10/2022   Lab Results  Component Value Date   IRON 39 11/14/2020   TIBC 329 11/14/2020   FERRITIN 25 11/14/2020   Attestation  Statements:   Reviewed by clinician on day of visit: allergies, medications, problem list, medical history, surgical history, family history, social history, and previous encounter notes.  I, Malcolm Metro, RMA, am acting as Energy manager for William Hamburger, NP.  I have reviewed the above documentation for accuracy and completeness, and I agree with the above. -  ***

## 2022-02-12 NOTE — Telephone Encounter (Signed)
Pt still has not heard anything about ozempic. Aniceto Boss was supposed to call her yesterday and she has not heard from  her. Please call pt

## 2022-02-12 NOTE — Progress Notes (Signed)
Name: Regina Christensen   MRN: [id number]    DOB: [birth year]   Date:02/12/2022       Progress Note  Subjective  Chief Complaint  Chief Complaint  Patient presents with   Sinusitis    Headache, facial pressure, runny nose,     I connected with  Mauricio Po  on 02/12/22 at 11:40 AM EDT by a video enabled telemedicine application and verified that I am speaking with the correct person using two identifiers.  I discussed the limitations of evaluation and management by telemedicine and the availability of in person appointments. The patient expressed understanding and agreed to proceed with a virtual visit  Staff also discussed with the patient that there may be a patient responsible charge related to this service. Patient Location: home Provider Location: [facility] Additional Individuals present: alone  HPI  URI:  patient was seen on 02/10/2022 for fatigue by Dr. Ancil Boozer.  She says she woke up this morning started having sinus congestion, facial pressure , left ear pressure and fullness and headache. She says she is not coughing a lot yet but she knows it is coming.  She denies any fever, shortness of breath.  She says she is taking nasonex which has helped. Discussed with patient coming by to get a COVID PCR test done.  Patient is going to swing by and do that.  Discussed if COVID-positive discussed antiviral treatment, side effects and administration.  Patient would like to take the treatment if positive.  Commend patient take Claritin and Mucinex.  Continue taking Nasonex.  Send in steroid taper and Tessalon Perles.  She can also take vitamin D, vitamin C and zinc.  We will also provide work note.  Patient Active Problem List   Diagnosis Date Noted   Osteopenia of necks of both femurs 01/22/2022   Bilateral lower extremity edema 09/30/2021   Chronic diastolic heart failure ([place]) 09/10/2021   History of total bilateral knee replacement 09/10/2021   History of blood transfusion 09/10/2020    Current use of long term anticoagulation    Diabetes mellitus ([place])    Paroxysmal atrial fibrillation ([place])    GERD without esophagitis    Vitamin D deficiency 04/12/2020   History of CVA (cerebrovascular accident) without residual deficits 03/05/2020   History of hysterectomy 07/19/2019   Anxiety 07/19/2019   MDD (major depressive disorder), recurrent, in full remission ([place]) 07/19/2019   Insomnia due to mental condition 07/19/2019   Bilateral carpal tunnel syndrome 07/30/2016   OSA (obstructive sleep apnea) 03/07/2016   BPPV (benign paroxysmal positional vertigo) 08/28/2015   Hypertension associated with type 2 diabetes mellitus ([place]) 11/09/2014   GAD (generalized anxiety disorder) 11/09/2014   Acanthosis nigricans 11/09/2014   Morbid obesity ([place]) 01/06/2012    Social History   Tobacco Use   Smoking status: Never   Smokeless tobacco: Never  Substance Use Topics   Alcohol use: No    Alcohol/week: 0.0 standard drinks of alcohol    Comment: rare     Current Outpatient Medications:    acetaminophen (TYLENOL) 650 MG CR tablet, Take 1 tablet (650 mg total) by mouth every 8 (eight) hours as needed for pain., Disp: , Rfl:    amLODipine (NORVASC) 5 MG tablet, Take 1 tablet (5 mg total) by mouth daily., Disp: 30 tablet, Rfl: 3   apixaban (ELIQUIS) 5 MG TABS tablet, Take 1 tablet (5 mg total) by mouth 2 (two) times daily., Disp: 180 tablet, Rfl: 3   atorvastatin (LIPITOR) 10  MG tablet, Take 1 tablet (10 mg total) by mouth daily., Disp: 90 tablet, Rfl: 1   famotidine (PEPCID) 20 MG tablet, Take 1 tablet (20 mg total) by mouth daily as needed for indigestion., Disp: 90 tablet, Rfl: 1   furosemide (LASIX) 20 MG tablet, Take 1 tablet (20 mg total) by mouth daily as needed., Disp: 90 tablet, Rfl: 1   gabapentin (NEURONTIN) 100 MG capsule, Take 100 mg by mouth at bedtime., Disp: , Rfl:    hydrOXYzine (ATARAX) 25 MG tablet, TAKE 0.5-1 TABLETS (12.5-25 MG TOTAL) BY MOUTH AT BEDTIME AS  NEEDED. FOR SLEEP, Disp: 90 tablet, Rfl: 1   meclizine (ANTIVERT) 12.5 MG tablet, Take 1 tablet (12.5 mg total) by mouth 3 (three) times daily as needed for dizziness., Disp: 30 tablet, Rfl: 0   metoprolol tartrate (LOPRESSOR) 100 MG tablet, Take 1 tablet (100 mg total) by mouth 2 (two) times daily., Disp: 180 tablet, Rfl: 1   Multiple Vitamin (MULTIVITAMIN) tablet, Take 1 tablet by mouth daily. BariatricPal Multivitamin, Disp: , Rfl:    olmesartan (BENICAR) 40 MG tablet, Take 1 tablet (40 mg total) by mouth daily., Disp: 90 tablet, Rfl: 1   potassium chloride (KLOR-CON M) 10 MEQ tablet, Take 2 tablets (20 mEq total) by mouth daily. Take with Lasix, Disp: 60 tablet, Rfl: 0   Semaglutide, 2 MG/DOSE, (OZEMPIC, 2 MG/DOSE,) 8 MG/3ML SOPN, Inject 2 mg into the skin once a week., Disp: 3 mL, Rfl: 0   triamcinolone (NASACORT) 55 MCG/ACT AERO nasal inhaler, Place 2 sprays into the nose daily., Disp: , Rfl:    venlafaxine XR (EFFEXOR-XR) 37.5 MG 24 hr capsule, Take 1 capsule (37.5 mg total) by mouth daily with breakfast. Take along with 75 mg daily, Disp: 90 capsule, Rfl: 1   venlafaxine XR (EFFEXOR-XR) 75 MG 24 hr capsule, Take 1 capsule (75 mg total) by mouth daily with breakfast. Take along with 37.5 mg daily, Disp: 90 capsule, Rfl: 1   Vitamin D, Ergocalciferol, (DRISDOL) 1.25 MG (50000 UNIT) CAPS capsule, Take 1 capsule (50,000 Units total) by mouth every 7 (seven) days., Disp: 12 capsule, Rfl: 1   XIIDRA 5 % SOLN, Place 1 drop into both eyes 2 (two) times daily., Disp: , Rfl: 4  Allergies  Allergen Reactions   Hydrocodone-Acetaminophen Other (See Comments)    Extreme headache   Ace Inhibitors Cough   Hctz [Hydrochlorothiazide] Rash    face    I personally reviewed active problem list, medication list, allergies, notes from last encounter with the patient/caregiver today.  ROS  Constitutional: Negative for fever or weight change.  Positive for fatigue HEENT: Positive for nasal congestion, left  ear pressure and fullness Respiratory: Positive for cough and negative for shortness of breath.   Cardiovascular: Negative for chest pain or palpitations.  Gastrointestinal: Negative for abdominal pain, no bowel changes.  Musculoskeletal: Negative for gait problem or joint swelling.  Skin: Negative for rash.  Neurological: Negative for dizziness, positive for headache.  No other specific complaints in a complete review of systems (except as listed in HPI above).   Objective  Virtual encounter, vitals not obtained.  There is no height or weight on file to calculate BMI.  Nursing Note and Vital Signs reviewed.  Physical Exam  Awake, alert, oriented x3 speaking in complete sentences  Results for orders placed or performed in visit on 02/10/22 (from the past 72 hour(s))  Lipid panel     Status: None   Collection Time: 02/10/22 12:09 PM  Result Value Ref Range   Cholesterol 112 <200 mg/dL   HDL 66 > OR = 50 mg/dL   Triglycerides 79 <150 mg/dL   LDL Cholesterol (Calc) 30 mg/dL (calc)    Comment: Reference range: <100 . Desirable range <100 mg/dL for primary prevention;   <70 mg/dL for patients with CHD or diabetic patients  with > or = 2 CHD risk factors. Marland Kitchen LDL-C is now calculated using the Martin-Hopkins  calculation, which is a validated novel method providing  better accuracy than the Friedewald equation in the  estimation of LDL-C.  Cresenciano Genre et al. Annamaria Helling. 0258;527(78): 2061-2068  (http://education.[facility].com/faq/FAQ164)    Total CHOL/HDL Ratio 1.7 <5.0 (calc)   Non-HDL Cholesterol (Calc) 46 <130 mg/dL (calc)    Comment: For patients with diabetes plus 1 major ASCVD risk  factor, treating to a non-HDL-C goal of <100 mg/dL  (LDL-C of <70 mg/dL) is considered a therapeutic  option.   Microalbumin / creatinine urine ratio     Status: Abnormal   Collection Time: 02/10/22 12:09 PM  Result Value Ref Range   Creatinine, Urine 256 20 - 275 mg/dL   Microalb, Ur 15.4  mg/dL    Comment: Reference Range Not established    Microalb Creat Ratio 60 (H) <30 mcg/mg creat    Comment: . The ADA defines abnormalities in albumin excretion as follows: Marland Kitchen Albuminuria Category        Result (mcg/mg creatinine) . Normal to Mildly increased   <30 Moderately increased         30-299  Severely increased           > OR = 300 . The ADA recommends that at least two of three specimens collected within a 3-6 month period be abnormal before considering a patient to be within a diagnostic category.   COMPLETE METABOLIC PANEL WITH GFR     Status: None   Collection Time: 02/10/22 12:09 PM  Result Value Ref Range   Glucose, Bld 72 65 - 99 mg/dL    Comment: .            Fasting reference interval .    BUN 11 7 - 25 mg/dL   Creat 0.86 0.50 - 1.05 mg/dL   eGFR 75 > OR = 60 mL/min/1.38m   BUN/Creatinine Ratio SEE NOTE: 6 - 22 (calc)    Comment:    Not Reported: BUN and Creatinine are within    reference range. .    Sodium 142 135 - 146 mmol/L   Potassium 4.3 3.5 - 5.3 mmol/L   Chloride 106 98 - 110 mmol/L   CO2 29 20 - 32 mmol/L   Calcium 9.8 8.6 - 10.4 mg/dL   Total Protein 6.6 6.1 - 8.1 g/dL   Albumin 4.0 3.6 - 5.1 g/dL   Globulin 2.6 1.9 - 3.7 g/dL (calc)   AG Ratio 1.5 1.0 - 2.5 (calc)   Total Bilirubin 0.4 0.2 - 1.2 mg/dL   Alkaline phosphatase (APISO) 86 37 - 153 U/L   AST 11 10 - 35 U/L   ALT 11 6 - 29 U/L  CBC with Differential/Platelet     Status: None   Collection Time: 02/10/22 12:09 PM  Result Value Ref Range   WBC 8.5 3.8 - 10.8 Thousand/uL   RBC 4.87 3.80 - 5.10 Million/uL   Hemoglobin 14.4 11.7 - 15.5 g/dL   HCT 43.2 35.0 - 45.0 %   MCV 88.7 80.0 - 100.0 fL   MCH 29.6 27.0 -  33.0 pg   MCHC 33.3 32.0 - 36.0 g/dL   RDW 14.5 11.0 - 15.0 %   Platelets 310 140 - 400 Thousand/uL   MPV 11.6 7.5 - 12.5 fL   Neutro Abs 5,950 1,500 - 7,800 cells/uL   Lymphs Abs 1,734 850 - 3,900 cells/uL   Absolute Monocytes 638 200 - 950 cells/uL    Eosinophils Absolute 111 15 - 500 cells/uL   Basophils Absolute 68 0 - 200 cells/uL   Neutrophils Relative % 70 %   Total Lymphocyte 20.4 %   Monocytes Relative 7.5 %   Eosinophils Relative 1.3 %   Basophils Relative 0.8 %  VITAMIN D 25 Hydroxy (Vit-D Deficiency, Fractures)     Status: None   Collection Time: 02/10/22 12:09 PM  Result Value Ref Range   Vit D, 25-Hydroxy 60 30 - 100 ng/mL    Comment: Vitamin D Status         25-OH Vitamin D: . Deficiency:                    <20 ng/mL Insufficiency:             20 - 29 ng/mL Optimal:                 > or = 30 ng/mL . For 25-OH Vitamin D testing on patients on  D2-supplementation and patients for whom quantitation  of D2 and D3 fractions is required, the [facility](TM) 25-OH VIT D, (D2,D3), LC/MS/MS is recommended: order  code [phone] (patients >45yr). . See Note 1 . Note 1 . For additional information, please refer to  http://education.[facility].com/faq/FAQ199  (This link is being provided for informational/ educational purposes only.)   Hemoglobin A1c     Status: Abnormal   Collection Time: 02/10/22 12:09 PM  Result Value Ref Range   Hgb A1c MFr Bld 6.0 (H) <5.7 % of total Hgb    Comment: For someone without known diabetes, a hemoglobin  A1c value between 5.7% and 6.4% is consistent with prediabetes and should be confirmed with a  follow-up test. . For someone with known diabetes, a value <7% indicates that their diabetes is well controlled. A1c targets should be individualized based on duration of diabetes, age, comorbid conditions, and other considerations. . This assay result is consistent with an increased risk of diabetes. . Currently, no consensus exists regarding use of hemoglobin A1c for diagnosis of diabetes for children. .    Mean Plasma Glucose 126 mg/dL   eAG (mmol/L) 7.0 mmol/L  B12 and Folate Panel     Status: None   Collection Time: 02/10/22 12:09 PM  Result Value Ref Range   Vitamin  B-12 699 200 - 1,100 pg/mL   Folate 18.3 ng/mL    Comment:                            Reference Range                            Low:           <3.4                            Borderline:    3.4-5.4  Normal:        >5.4 .   Troponin I     Status: None   Collection Time: 02/10/22 12:09 PM  Result Value Ref Range   Troponin I <3 < OR = 47 ng/L    Comment: . In accord with published recommendations, serial testing of troponin I at intervals of 2 to 4 hours for up to 12 to 24 hours is suggested in order to corroborate a single troponin I result. An elevated troponin alone is not sufficient to make the diagnosis of MI. .   CK Total (and CKMB)     Status: None   Collection Time: 02/10/22 12:09 PM  Result Value Ref Range   Total CK 67 29 - 143 U/L   CK, MB <0.7 0 - 5.0 ng/mL   Relative Index  0 - 4.0    Comment: Result not calculated because one or more required values exceed analytical limits.     Assessment & Plan  Problem List Items Addressed This Visit   None Visit Diagnoses     Viral upper respiratory tract infection    -  Primary   COVID PCR.  Continue taking Nasonex.  Start taking Claritin and Mucinex.  Sent in steroid taper and Tessalon Perles.  Can also take vitamin D, vitamin C, zinc   Relevant Medications   predniSONE (STERAPRED UNI-PAK 21 TAB) 10 MG (21) TBPK tablet   benzonatate (TESSALON) 100 MG capsule   Other Relevant Orders   Novel Coronavirus, NAA ([facility])     Push fluids get rest.  We will send in antiviral if COVID-positive.  -Red flags and when to present for emergency care or RTC including fever >101.72F, chest pain, shortness of breath, new/worsening/un-resolving symptoms,  reviewed with patient at time of visit. Follow up and care instructions discussed and provided in AVS. - I discussed the assessment and treatment plan with the patient. The patient was provided an opportunity to ask questions and all were answered. The  patient agreed with the plan and demonstrated an understanding of the instructions.  I provided 15 minutes of non-face-to-face time during this encounter.  Bo Merino, FNP

## 2022-02-13 LAB — NOVEL CORONAVIRUS, NAA: SARS-CoV-2, NAA: NOT DETECTED

## 2022-02-14 ENCOUNTER — Other Ambulatory Visit: Payer: Self-pay | Admitting: Family Medicine

## 2022-02-14 DIAGNOSIS — I5032 Chronic diastolic (congestive) heart failure: Secondary | ICD-10-CM

## 2022-02-18 DIAGNOSIS — E1159 Type 2 diabetes mellitus with other circulatory complications: Secondary | ICD-10-CM | POA: Diagnosis not present

## 2022-02-18 DIAGNOSIS — I5030 Unspecified diastolic (congestive) heart failure: Secondary | ICD-10-CM | POA: Diagnosis not present

## 2022-02-18 DIAGNOSIS — F32A Depression, unspecified: Secondary | ICD-10-CM

## 2022-02-18 DIAGNOSIS — I4891 Unspecified atrial fibrillation: Secondary | ICD-10-CM | POA: Diagnosis not present

## 2022-02-18 DIAGNOSIS — E785 Hyperlipidemia, unspecified: Secondary | ICD-10-CM | POA: Diagnosis not present

## 2022-02-18 DIAGNOSIS — R69 Illness, unspecified: Secondary | ICD-10-CM | POA: Diagnosis not present

## 2022-02-19 ENCOUNTER — Encounter: Payer: Self-pay | Admitting: Cardiology

## 2022-02-19 ENCOUNTER — Ambulatory Visit: Payer: Medicare HMO | Attending: Cardiology | Admitting: Cardiology

## 2022-02-19 VITALS — BP 110/80 | HR 84 | Ht 68.0 in | Wt 255.0 lb

## 2022-02-19 DIAGNOSIS — I480 Paroxysmal atrial fibrillation: Secondary | ICD-10-CM | POA: Diagnosis not present

## 2022-02-19 DIAGNOSIS — I10 Essential (primary) hypertension: Secondary | ICD-10-CM

## 2022-02-19 NOTE — Progress Notes (Signed)
Electrophysiology Office Note:    Date:  02/19/2022   ID:  Regina Christensen, DOB 06/16/1955, MRN [id number]  PCP:  Steele Sizer, MD  [facility] Cardiologist:  None  [facility] Electrophysiologist:  Vickie Epley, MD   Referring MD: Kate Sable, MD   Chief Complaint: AF  History of Present Illness:    Regina Christensen is a 66 y.o. female who presents for an evaluation of AF at the request of Dr Garen Lah. Their medical history includes hypertension, paroxysmal atrial fibrillation, stroke, obesity, sleep apnea.  The patient last saw Dr. Garen Lah December 30, 2021.  She is referred to discuss catheter ablation as a long-term rhythm control strategy.  Patient has also had trouble affording Eliquis.  When she is in atrial fibrillation she feels palpitations and fatigue.  She has a strong family history of atrial fibrillation.  Her sister had atrial fibrillation and has 4 sons who all have atrial fibrillation one of the sons had a stroke.  She also has a niece with atrial fibrillation.  Her father had atrial fibrillation and had to be cardioverted multiple times.  She takes Eliquis for stroke prophylaxis and tolerates the medicine.  She is on a large dose of metoprolol.  She has sleep apnea but does not use CPAP because of mask intolerance.  She has tried multiple masks and even tried and dental appliance that did not work.    Past Medical History:  Diagnosis Date   Anxiety    Arthritis    Back pain    BPPV (benign paroxysmal positional vertigo)    Chronic diastolic HF (heart failure) (HCC)    Complication of anesthesia    Post-operative hypoxia requiring supplemental oxygen   Current use of long term anticoagulation    Apixaban   CVA (cerebral vascular accident) ([place]) 02/28/2020   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; imaging from [facility]   Depression    Edema of both lower extremities    GERD (gastroesophageal reflux disease)    Hypertension    IBS  (irritable bowel syndrome)    Lactose intolerance    Morbid obesity with BMI of 45.0-49.9, adult ([place])    Obesity    Osteoarthritis    PAF (paroxysmal atrial fibrillation) ([place])    Pre-diabetes    Sleep apnea    uses CPAP machine sometimes    Type 2 diabetes mellitus without complication ([place])    Vitamin D deficiency     Past Surgical History:  Procedure Laterality Date   ABDOMINAL HYSTERECTOMY     BREAST EXCISIONAL BIOPSY Left    ENDOMETRIAL ABLATION     x2   ENDOSCOPIC CONCHA BULLOSA RESECTION Bilateral 08/08/2020   Procedure: ENDOSCOPIC CONCHA BULLOSA RESECTION;  Surgeon: Margaretha Sheffield, MD;  Location: [facility] ORS;  Service: ENT;  Laterality: Bilateral;   ETHMOIDECTOMY Right 08/08/2020   Procedure: ETHMOIDECTOMY, LEFT PARTIAL ETHMOIDECTOMY;  Surgeon: Margaretha Sheffield, MD;  Location: [facility] ORS;  Service: ENT;  Laterality: Right;   IMAGE GUIDED SINUS SURGERY N/A 08/08/2020   Procedure: IMAGE GUIDED SINUS SURGERY, RIGHT FRONTAL SINUSOTOMY;  Surgeon: Margaretha Sheffield, MD;  Location: [facility] ORS;  Service: ENT;  Laterality: N/A;   LAPAROSCOPIC SLEEVE GASTRECTOMY     MAXILLARY ANTROSTOMY Bilateral 08/08/2020   Procedure: MAXILLARY ANTROSTOMY with tissue;  Surgeon: Margaretha Sheffield, MD;  Location: [facility] ORS;  Service: ENT;  Laterality: Bilateral;   NASAL TURBINATE REDUCTION  08/27/2020   Procedure: TURBINATE REDUCTION /SUBMUCOSAL DEBRIDEMENT;  Surgeon: Margaretha Sheffield, MD;  Location: [facility] ORS;  Service: ENT;;   TOTAL KNEE ARTHROPLASTY Right 06/2019   TOTAL KNEE ARTHROPLASTY Left 01/31/2021   [facility]    Current Medications: Current Meds  Medication Sig   acetaminophen (TYLENOL) 650 MG CR tablet Take 1 tablet (650 mg total) by mouth every 8 (eight) hours as needed for pain.   amLODipine (NORVASC) 5 MG tablet Take 1 tablet (5 mg total) by mouth daily.   apixaban (ELIQUIS) 5 MG TABS tablet Take 1 tablet (5 mg total) by mouth 2 (two) times daily.   atorvastatin (LIPITOR) 10 MG  tablet Take 1 tablet (10 mg total) by mouth daily.   benzonatate (TESSALON) 100 MG capsule Take 2 capsules (200 mg total) by mouth 2 (two) times daily as needed for cough.   famotidine (PEPCID) 20 MG tablet Take 1 tablet (20 mg total) by mouth daily as needed for indigestion.   furosemide (LASIX) 20 MG tablet Take 1 tablet (20 mg total) by mouth daily as needed.   gabapentin (NEURONTIN) 100 MG capsule Take 100 mg by mouth at bedtime.   hydrOXYzine (ATARAX) 25 MG tablet TAKE 0.5-1 TABLETS (12.5-25 MG TOTAL) BY MOUTH AT BEDTIME AS NEEDED. FOR SLEEP   meclizine (ANTIVERT) 12.5 MG tablet Take 1 tablet (12.5 mg total) by mouth 3 (three) times daily as needed for dizziness.   metoprolol tartrate (LOPRESSOR) 100 MG tablet Take 1 tablet (100 mg total) by mouth 2 (two) times daily.   Multiple Vitamin (MULTIVITAMIN) tablet Take 1 tablet by mouth daily. BariatricPal Multivitamin   olmesartan (BENICAR) 40 MG tablet TAKE 1 TABLET BY MOUTH EVERY DAY   potassium chloride (KLOR-CON M) 10 MEQ tablet Take 2 tablets (20 mEq total) by mouth daily. Take with Lasix   Semaglutide, 2 MG/DOSE, (OZEMPIC, 2 MG/DOSE,) 8 MG/3ML SOPN Inject 2 mg into the skin once a week.   triamcinolone (NASACORT) 55 MCG/ACT AERO nasal inhaler Place 2 sprays into the nose daily.   venlafaxine XR (EFFEXOR-XR) 37.5 MG 24 hr capsule Take 1 capsule (37.5 mg total) by mouth daily with breakfast. Take along with 75 mg daily   venlafaxine XR (EFFEXOR-XR) 75 MG 24 hr capsule Take 1 capsule (75 mg total) by mouth daily with breakfast. Take along with 37.5 mg daily   Vitamin D, Ergocalciferol, (DRISDOL) 1.25 MG (50000 UNIT) CAPS capsule Take 1 capsule (50,000 Units total) by mouth every 7 (seven) days.     Allergies:   Hydrocodone-acetaminophen, Ace inhibitors, and Hctz [hydrochlorothiazide]   Social History   Socioeconomic History   Marital status: Divorced    Spouse name: Not on file   Number of children: 2   Years of education: Not on file    Highest education level: Associate degree: occupational, Scientist, product/process development, or vocational program  Occupational History   Occupation: Retired/ work PT    Employer: [employer]  Tobacco Use   Smoking status: Never   Smokeless tobacco: Never  Vaping Use   Vaping Use: Never used  Substance and Sexual Activity   Alcohol use: No    Alcohol/week: 0.0 standard drinks of alcohol    Comment: rare   Drug use: No   Sexual activity: Not on file  Other Topics Concern   Not on file  Social History Narrative   Not on file   Social Determinants of Health   Financial Resource Strain: High Risk (01/22/2022)   Overall Financial Resource Strain (CARDIA)    Difficulty of Paying Living Expenses: Very hard  Food Insecurity:  No Food Insecurity (10/18/2021)   Hunger Vital Sign    Worried About Running Out of Food in the Last Year: Never true    Ran Out of Food in the Last Year: Never true  Transportation Needs: No Transportation Needs (01/22/2022)   PRAPARE - Administrator, Civil Service (Medical): No    Lack of Transportation (Non-Medical): No  Physical Activity: Insufficiently Active (10/18/2021)   Exercise Vital Sign    Days of Exercise per Week: 3 days    Minutes of Exercise per Session: 10 min  Stress: No Stress Concern Present (10/18/2021)   [employer] of Occupational Health - Occupational Stress Questionnaire    Feeling of Stress : Only a little  Social Connections: Moderately Integrated (10/18/2021)   Social Connection and Isolation Panel [NHANES]    Frequency of Communication with Friends and Family: More than three times a week    Frequency of Social Gatherings with Friends and Family: More than three times a week    Attends Religious Services: More than 4 times per year    Active Member of [employer] or Organizations: Yes    Attends Banker Meetings: Never    Marital Status: Divorced     Family History: The patient's family history includes Cancer in her brother; Diabetes in  her mother; Heart disease in her father; High blood pressure in her mother. There is no history of Mental illness.  ROS:   Please see the history of present illness.    All other systems reviewed and are negative.  EKGs/Labs/Other Studies Reviewed:    The following studies were reviewed today:  January 27, 2022 ZIO monitor Heart rate 62-210, average 77 33 nonsustained SVT, longest 16.4 seconds Occasional supraventricular ectopy Rare ventricular ectopy  March 01, 2020 echo EF 55% RV normal Mildly dilated left atrium Trivial MR    Recent Labs: 02/10/2022: ALT 11; BUN 11; Creat 0.86; Hemoglobin 14.4; Platelets 310; Potassium 4.3; Sodium 142  Recent Lipid Panel    Component Value Date/Time   CHOL 112 02/10/2022 1209   CHOL 151 12/05/2014 1117   TRIG 79 02/10/2022 1209   HDL 66 02/10/2022 1209   HDL 74 12/05/2014 1117   CHOLHDL 1.7 02/10/2022 1209   VLDL 14 03/01/2020 0306   LDLCALC 30 02/10/2022 1209    Physical Exam:    VS:  BP 110/80   Pulse 84   Ht 5\' 8"  (1.727 m)   Wt 255 lb (115.7 kg)   BMI 38.77 kg/m     Wt Readings from Last 3 Encounters:  02/19/22 255 lb (115.7 kg)  02/10/22 253 lb 1.6 oz (114.8 kg)  02/05/22 252 lb (114.3 kg)     GEN:  Well nourished, well developed in no acute distress.  Obese HEENT: Normal NECK: No JVD; No carotid bruits LYMPHATICS: No lymphadenopathy CARDIAC: RRR, no murmurs, rubs, gallops RESPIRATORY:  Clear to auscultation without rales, wheezing or rhonchi  ABDOMEN: Soft, non-tender, non-distended MUSCULOSKELETAL:  No edema; No deformity  SKIN: Warm and dry NEUROLOGIC:  Alert and oriented x 3 PSYCHIATRIC:  Normal affect       ASSESSMENT:    1. Paroxysmal atrial fibrillation (HCC)   2. Primary hypertension    PLAN:    In order of problems listed above:  #Paroxysmal atrial fibrillation  Discussed antiarrhythmic drugs and catheter ablation in detail with the patient during today's visit.  The patient is  symptomatic with atrial fibrillation.  Rhythm control is indicated.  She would  like some time to think about her options with her children which is very reasonable.  She will let us know if she would like to proceed with catheter ablation.  Discussed treatment options today for their AF including antiarrhythmic drug therapy and ablation. Discussed risks, recovery and likelihood of success. Discussed potential need for repeat ablation procedures and antiarrhythmic drugs after an initial ablation. Risk, benefits, and alternatives to EP study and radiofrequency ablation for afib were also discussed in detail today. These risks include but are not limited to stroke, bleeding, vascular damage, tamponade, perforation, damage to the esophagus, lungs, and other structures, pulmonary vein stenosis, worsening renal function, and death. The patient understands these risk.  If she elects to proceed, Carto, ICE, anesthesia are requested for the procedure.  Will also obtain CT PV protocol prior to the procedure to exclude LAA thrombus and further evaluate atrial anatomy.  #Hypertension At goal today.  Recommend checking blood pressures 1-2 times per week at home and recording the values.  Recommend bringing these recordings to the primary care physician.  Follow-up as needed.   Medication Adjustments/Labs and Tests Ordered: Current medicines are reviewed at length with the patient today.  Concerns regarding medicines are outlined above.  No orders of the defined types were placed in this encounter.  No orders of the defined types were placed in this encounter.    Signed, Rossie Muskrat. Lalla Brothers, MD, [facility] 02/19/2022 8:26 AM    Electrophysiology [facility]

## 2022-02-19 NOTE — Patient Instructions (Signed)
Medication Instructions:  none *If you need a refill on your cardiac medications before your next appointment, please call your pharmacy*   Lab Work: none If you have labs (blood work) drawn today and your tests are completely normal, you will receive your results only by: [place] (if you have MyChart) OR A paper copy in the mail If you have any lab test that is abnormal or we need to change your treatment, we will call you to review the results.   Testing/Procedures: Your physician has requested that you have cardiac CT. Cardiac computed tomography (CT) is a painless test that uses an x-ray machine to take clear, detailed pictures of your heart. For further information please visit [email or web address]. Please follow instruction sheet as given.  Your physician has recommended that you have an ablation. Catheter ablation is a medical procedure used to treat some cardiac arrhythmias (irregular heartbeats). During catheter ablation, a long, thin, flexible tube is put into a blood vessel in your groin (upper thigh), or neck. This tube is called an ablation catheter. It is then guided to your heart through the blood vessel. Radio frequency waves destroy small areas of heart tissue where abnormal heartbeats may cause an arrhythmia to start. Please see the instruction sheet given to you today.    Follow-Up: At [facility], you and your health needs are our priority.  As part of our continuing mission to provide you with exceptional heart care, we have created designated Provider Care Teams.  These Care Teams include your primary Cardiologist (physician) and Advanced Practice Providers (APPs -  Physician Assistants and Nurse Practitioners) who all work together to provide you with the care you need, when you need it.  We recommend signing up for the patient portal called "MyChart".  Sign up information is provided on this After Visit Summary.  MyChart is used to connect with patients  for Virtual Visits (Telemedicine).  Patients are able to view lab/test results, encounter notes, upcoming appointments, etc.  Non-urgent messages can be sent to your provider as well.   To learn more about what you can do with MyChart, go to [email or web address].    Your next appointment:   Call the office if you wish to move forward with the Afib Ablation.   Important Information About Sugar

## 2022-02-20 ENCOUNTER — Ambulatory Visit: Payer: Medicare HMO | Admitting: Psychiatry

## 2022-02-24 ENCOUNTER — Telehealth: Payer: Self-pay | Admitting: Cardiology

## 2022-02-24 DIAGNOSIS — I480 Paroxysmal atrial fibrillation: Secondary | ICD-10-CM

## 2022-02-24 DIAGNOSIS — Z01818 Encounter for other preprocedural examination: Secondary | ICD-10-CM

## 2022-02-24 NOTE — Telephone Encounter (Signed)
Patient is calling stating she would like to move forward with the ablation previously discussed with Dr. Quentin Ore. She would also like to discuss what to do up until the ablation to maintain her HR. Please advise.

## 2022-02-24 NOTE — Telephone Encounter (Signed)
Left a message for the patient to call back.  

## 2022-02-24 NOTE — Telephone Encounter (Signed)
The patient called stating that she was ready to move forward with the ablation. Message sent to the provider and nurse.

## 2022-02-25 ENCOUNTER — Encounter: Payer: Self-pay | Admitting: Cardiology

## 2022-02-25 NOTE — Telephone Encounter (Signed)
Follow Up:     Patient is returning Coleen's call from today.

## 2022-02-25 NOTE — Telephone Encounter (Signed)
Patient also notes she is concerned about her shortness of breath that has been happening more often lately. Said she has some swelling at her ankles and SOB today with activity. Stating she was going to take her PRN Lasix today. She had a recent visit where she saw they put chronic diastolic heart failure and she has never known she had this. Would like to speak with Dr. Thereasa Solo team about this and what she needs to do going forward.   Advised the patient to start daily weighing, elevate her legs when able, watch/reduce salt intake.  Will forward for advisement.

## 2022-02-25 NOTE — Telephone Encounter (Signed)
Called patient and left a VM requesting a call back. 

## 2022-02-25 NOTE — Telephone Encounter (Signed)
Procedure date picked February 8, preop labs Jan 15-19 at the [facility] in [place]. Patient asked to have CT and ablation instruction sent over mychart (sometimes she has trouble with this). Please mail as well.

## 2022-02-26 ENCOUNTER — Other Ambulatory Visit: Payer: Self-pay | Admitting: Family Medicine

## 2022-02-26 ENCOUNTER — Encounter: Payer: Self-pay | Admitting: Cardiology

## 2022-02-26 ENCOUNTER — Ambulatory Visit: Payer: Medicare HMO | Attending: Cardiology | Admitting: Cardiology

## 2022-02-26 VITALS — BP 120/100 | HR 131 | Ht 68.0 in | Wt 265.4 lb

## 2022-02-26 DIAGNOSIS — Z6841 Body Mass Index (BMI) 40.0 and over, adult: Secondary | ICD-10-CM | POA: Diagnosis not present

## 2022-02-26 DIAGNOSIS — R7303 Prediabetes: Secondary | ICD-10-CM

## 2022-02-26 DIAGNOSIS — Z7901 Long term (current) use of anticoagulants: Secondary | ICD-10-CM

## 2022-02-26 DIAGNOSIS — I5032 Chronic diastolic (congestive) heart failure: Secondary | ICD-10-CM

## 2022-02-26 DIAGNOSIS — I480 Paroxysmal atrial fibrillation: Secondary | ICD-10-CM | POA: Diagnosis not present

## 2022-02-26 DIAGNOSIS — I10 Essential (primary) hypertension: Secondary | ICD-10-CM | POA: Diagnosis not present

## 2022-02-26 DIAGNOSIS — F3342 Major depressive disorder, recurrent, in full remission: Secondary | ICD-10-CM

## 2022-02-26 MED ORDER — METOPROLOL TARTRATE 100 MG PO TABS: 150.0000 mg | ORAL_TABLET | Freq: Two times a day (BID) | ORAL | 1 refills | Status: DC

## 2022-02-26 MED ORDER — FUROSEMIDE 20 MG PO TABS: 20.0000 mg | ORAL_TABLET | Freq: Every day | ORAL | 1 refills | Status: DC | PRN

## 2022-02-26 NOTE — Progress Notes (Signed)
Cardiology Clinic Note   Patient Name: Regina Christensen Date of Encounter: 02/26/2022  Primary Care Provider:  [name], MD Primary Cardiologist:  None  Patient Profile    66 year old female with a history of hypertension, paroxysmal atrial fibrillation on chronic apixaban, CVA 02/2020, obesity, and sleep apnea who presents today for follow-up on palpitations.  Past Medical History    Past Medical History:  Diagnosis Date   Anxiety    Arthritis    Back pain    BPPV (benign paroxysmal positional vertigo)    Chronic diastolic HF (heart failure) (HCC)    Complication of anesthesia    Post-operative hypoxia requiring supplemental oxygen   Current use of long term anticoagulation    Apixaban   CVA (cerebral vascular accident) (HCC) 02/28/2020   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; imaging from [facility]   Depression    Edema of both lower extremities    GERD (gastroesophageal reflux disease)    Hypertension    IBS (irritable bowel syndrome)    Lactose intolerance    Morbid obesity with BMI of 45.0-49.9, adult (HCC)    Obesity    Osteoarthritis    PAF (paroxysmal atrial fibrillation) (HCC)    Pre-diabetes    Sleep apnea    uses CPAP machine sometimes    Type 2 diabetes mellitus without complication (HCC)    Vitamin D deficiency    Past Surgical History:  Procedure Laterality Date   ABDOMINAL HYSTERECTOMY     BREAST EXCISIONAL BIOPSY Left    ENDOMETRIAL ABLATION     x2   ENDOSCOPIC CONCHA BULLOSA RESECTION Bilateral 08/08/2020   Procedure: ENDOSCOPIC CONCHA BULLOSA RESECTION;  Surgeon: Vernie Murders, MD;  Location: [facility];  Service: ENT;  Laterality: Bilateral;   ETHMOIDECTOMY Right 08/08/2020   Procedure: ETHMOIDECTOMY, LEFT PARTIAL ETHMOIDECTOMY;  Surgeon: Vernie Murders, MD;  Location: [facility];  Service: ENT;  Laterality: Right;   IMAGE GUIDED SINUS SURGERY N/A 08/08/2020   Procedure: IMAGE GUIDED SINUS SURGERY, RIGHT FRONTAL SINUSOTOMY;  Surgeon:  Vernie Murders, MD;  Location: [facility];  Service: ENT;  Laterality: N/A;   LAPAROSCOPIC SLEEVE GASTRECTOMY     MAXILLARY ANTROSTOMY Bilateral 08/08/2020   Procedure: MAXILLARY ANTROSTOMY with tissue;  Surgeon: Vernie Murders, MD;  Location: [facility];  Service: ENT;  Laterality: Bilateral;   NASAL TURBINATE REDUCTION  08/27/2020   Procedure: TURBINATE REDUCTION /SUBMUCOSAL DEBRIDEMENT;  Surgeon: Vernie Murders, MD;  Location: [facility];  Service: ENT;;   TOTAL KNEE ARTHROPLASTY Right 06/2019   TOTAL KNEE ARTHROPLASTY Left 01/31/2021   [facility]    Allergies  Allergies  Allergen Reactions   Hydrocodone-Acetaminophen Other (See Comments)    Extreme headache   Ace Inhibitors Cough   Hctz [Hydrochlorothiazide] Rash    face    History of Present Illness    Regina Christensen is a 66 year old female with history of hypertension, paroxysmal atrial fibrillation on chronic anticoagulation with apixaban, CVA 02/2020 secondary to not taking apixaban, obesity, sleep apnea with noncompliance with CPAP because of mask intolerance, symptomatic palpitations.  Echocardiogram completed 11/21 revealed LVEF 55-60%, no regional wall motion abnormalities, G1 DD, and trivial mitral valve regurgitation.  Long-term monitor on 01/27/2022 revealed paroxysmal SVT noted without A-fib or flutter noted she was continued on metoprolol and apixaban as prescribed.  She was last seen in clinic 12/30/2021 by Dr. Azucena Cecil where she been undergoing stressful events recently including falling off her Eliquis 5 also obtain an MRI  despite being claustrophobic.  She thinks her stressful events triggered her atrial fibrillation.  She complains of palpitations associated with worsening shortness of breath.  She was referred to EP clinic for additional input and followed up with Dr. Lalla Brothers.  She was evaluated by Dr. Lalla Brothers on 02/19/2022.  At that time the discussion of treatment options of atrial  fibrillation including antiarrhythmic drug therapy and ablation.  Originally she wants time to think about her options with her children which was very reasonable she returns today she states that her ablation is scheduled for February 2024.  She returns today with complaints of anxiety, fatigue, shortness of breath, and fluid retention.  Unfortunately she has not taken any of her as needed furosemide.  Her weight is up approximately 10 pounds from her previous visit.  Heart rate and blood pressure elevated today but she states that her anxiety is high as well.  She does deny any chest pain or chest pressure but has continued shortness of breath.  She states that she did decide to go ahead and have the ablation procedure completed in February as after talking to her family and her nephew had the same procedure done recently by Dr. Lalla Brothers and is done well.  Home Medications    Current Outpatient Medications  Medication Sig Dispense Refill   acetaminophen (TYLENOL) 650 MG CR tablet Take 1 tablet (650 mg total) by mouth every 8 (eight) hours as needed for pain.     amLODipine (NORVASC) 5 MG tablet Take 1 tablet (5 mg total) by mouth daily. 30 tablet 3   apixaban (ELIQUIS) 5 MG TABS tablet Take 1 tablet (5 mg total) by mouth 2 (two) times daily. 180 tablet 3   atorvastatin (LIPITOR) 10 MG tablet Take 1 tablet (10 mg total) by mouth daily. 90 tablet 1   benzonatate (TESSALON) 100 MG capsule Take 2 capsules (200 mg total) by mouth 2 (two) times daily as needed for cough. 20 capsule 0   famotidine (PEPCID) 20 MG tablet Take 1 tablet (20 mg total) by mouth daily as needed for indigestion. 90 tablet 1   furosemide (LASIX) 20 MG tablet Take 1 tablet (20 mg total) by mouth daily as needed. 90 tablet 1   gabapentin (NEURONTIN) 100 MG capsule Take 100 mg by mouth at bedtime.     hydrOXYzine (ATARAX) 25 MG tablet TAKE 0.5-1 TABLETS (12.5-25 MG TOTAL) BY MOUTH AT BEDTIME AS NEEDED. FOR SLEEP 90 tablet 1    meclizine (ANTIVERT) 12.5 MG tablet Take 1 tablet (12.5 mg total) by mouth 3 (three) times daily as needed for dizziness. 30 tablet 0   metoprolol tartrate (LOPRESSOR) 100 MG tablet Take 1 tablet (100 mg total) by mouth 2 (two) times daily. 180 tablet 1   Multiple Vitamin (MULTIVITAMIN) tablet Take 1 tablet by mouth daily. BariatricPal Multivitamin     olmesartan (BENICAR) 40 MG tablet TAKE 1 TABLET BY MOUTH EVERY DAY 90 tablet 1   potassium chloride (KLOR-CON M) 10 MEQ tablet Take 2 tablets (20 mEq total) by mouth daily. Take with Lasix 60 tablet 0   Semaglutide, 2 MG/DOSE, (OZEMPIC, 2 MG/DOSE,) 8 MG/3ML SOPN Inject 2 mg into the skin once a week. 3 mL 0   triamcinolone (NASACORT) 55 MCG/ACT AERO nasal inhaler Place 2 sprays into the nose daily.     venlafaxine XR (EFFEXOR-XR) 37.5 MG 24 hr capsule Take 1 capsule (37.5 mg total) by mouth daily with breakfast. Take along with 75 mg daily 90 capsule 1  venlafaxine XR (EFFEXOR-XR) 75 MG 24 hr capsule Take 1 capsule (75 mg total) by mouth daily with breakfast. Take along with 37.5 mg daily 90 capsule 1   Vitamin D, Ergocalciferol, (DRISDOL) 1.25 MG (50000 UNIT) CAPS capsule Take 1 capsule (50,000 Units total) by mouth every 7 (seven) days. 12 capsule 1   XIIDRA 5 % SOLN Place 1 drop into both eyes 2 (two) times daily.  4   No current facility-administered medications for this visit.     Family History    Family History  Problem Relation Age of Onset   Diabetes Mother    High blood pressure Mother    Heart disease Father    Cancer Brother    Mental illness Neg Hx    She indicated that her mother is deceased. She indicated that her father is deceased. She indicated that her sister is alive. She indicated that her brother is deceased. She indicated that her daughter is alive. She indicated that her son is alive. She indicated that the status of her neg hx is unknown.  Social History    Social History   Socioeconomic History   Marital  status: Divorced    Spouse name: Not on file   Number of children: 2   Years of education: Not on file   Highest education level: Associate degree: occupational, Scientist, product/process development, or vocational program  Occupational History   Occupation: Retired/ work PT    Employer: [employer]  Tobacco Use   Smoking status: Never   Smokeless tobacco: Never  Vaping Use   Vaping Use: Never used  Substance and Sexual Activity   Alcohol use: No    Alcohol/week: 0.0 standard drinks of alcohol    Comment: rare   Drug use: No   Sexual activity: Not on file  Other Topics Concern   Not on file  Social History Narrative   Not on file   Social Determinants of Health   Financial Resource Strain: High Risk (01/22/2022)   Overall Financial Resource Strain (CARDIA)    Difficulty of Paying Living Expenses: Very hard  Food Insecurity: No Food Insecurity (10/18/2021)   Hunger Vital Sign    Worried About Running Out of Food in the Last Year: Never true    Ran Out of Food in the Last Year: Never true  Transportation Needs: No Transportation Needs (01/22/2022)   PRAPARE - Administrator, Civil Service (Medical): No    Lack of Transportation (Non-Medical): No  Physical Activity: Insufficiently Active (10/18/2021)   Exercise Vital Sign    Days of Exercise per Week: 3 days    Minutes of Exercise per Session: 10 min  Stress: No Stress Concern Present (10/18/2021)   [employer] of Occupational Health - Occupational Stress Questionnaire    Feeling of Stress : Only a little  Social Connections: Moderately Integrated (10/18/2021)   Social Connection and Isolation Panel [NHANES]    Frequency of Communication with Friends and Family: More than three times a week    Frequency of Social Gatherings with Friends and Family: More than three times a week    Attends Religious Services: More than 4 times per year    Active Member of [employer] or Organizations: Yes    Attends Banker Meetings: Never    Marital  Status: Divorced  Catering manager Violence: Not At Risk (10/18/2021)   Humiliation, Afraid, Rape, and Kick questionnaire    Fear of Current or Ex-Partner: No    Emotionally Abused: No  Physically Abused: No    Sexually Abused: No     Review of Systems    General:  No chills, fever, night sweats or weight changes.  Endorses fatigue Cardiovascular:  No chest pain, endorses dyspnea on exertion, edema, orthopnea, palpitations, but denies paroxysmal nocturnal dyspnea. Dermatological: No rash, lesions/masses Respiratory: No cough, endorses dyspnea Urologic: No hematuria, dysuria Abdominal:   No nausea, vomiting, diarrhea, bright red blood per rectum, melena, or hematemesis Neurologic:  No visual changes, wkns, changes in mental status. All other systems reviewed and are otherwise negative except as noted above.   Physical Exam    VS:  BP (!) 120/100 (BP Location: Left Arm, Patient Position: Sitting, Cuff Size: Large)   Pulse (!) 131   Ht 5\' 8"  (1.727 m)   Wt 265 lb 6 oz (120.4 kg)   SpO2 98%   BMI 40.35 kg/m  , BMI Body mass index is 40.35 kg/m.     GEN: Well nourished, well developed, in no acute distress. HEENT: normal. Neck: Supple, no JVD, carotid bruits, or masses. Cardiac: Irregularly irregular, no murmurs, rubs, or gallops. No clubbing, cyanosis, 1+ edema.  Radials/DP/PT 2+ and equal bilaterally.  Respiratory:  Respirations regular and unlabored, clear to auscultation bilaterally. GI: Soft, nontender, slight distended, obese, BS + x 4. MS: no deformity or atrophy. Skin: warm and dry, no rash. Neuro:  Strength and sensation are intact. Psych: Normal affect.  Accessory Clinical Findings    ECG personally reviewed by me today-atrial fibrillation RVR rates 1 20-1 30, left axis deviation- No acute changes  Lab Results  Component Value Date   WBC 8.5 02/10/2022   HGB 14.4 02/10/2022   HCT 43.2 02/10/2022   MCV 88.7 02/10/2022   PLT 310 02/10/2022   Lab Results   Component Value Date   CREATININE 0.86 02/10/2022   BUN 11 02/10/2022   NA 142 02/10/2022   K 4.3 02/10/2022   CL 106 02/10/2022   CO2 29 02/10/2022   Lab Results  Component Value Date   ALT 11 02/10/2022   AST 11 02/10/2022   ALKPHOS 100 08/28/2021   BILITOT 0.4 02/10/2022   Lab Results  Component Value Date   CHOL 112 02/10/2022   HDL 66 02/10/2022   LDLCALC 30 02/10/2022   TRIG 79 02/10/2022   CHOLHDL 1.7 02/10/2022    Lab Results  Component Value Date   HGBA1C 6.0 (H) 02/10/2022    Assessment & Plan   1.  Paroxysmal atrial fibrillation in A-fib RVR currently.  Stressful events today likely triggered her atrial fibrillation she has a granddaughter with her.  She is very anxious today and stating that she has agreed to have the A-fib ablation procedure done.  We are increasing her metoprolol to 150 mg twice daily and she has been advised to take her furosemide daily for the next 5 days.  He is also continued on apixaban 5 mg twice daily  2.  Hypertension with blood pressure today 120/100 on recheck of 130/88.  She is continued on amlodipine 5 mg daily, Lasix 20 mg daily for the next 5 days and then as needed, metoprolol tartrate 150 mg twice daily, olmesartan 40 mg daily.  She has been encouraged to continue to monitor her pressure at home.  3.  Obesity with a BMI of 40.36.  She does have a 10 pound weight gain from visits a week prior.  She has been advised to avoid prepackaged preprocessed foods lower sodium intake monitor blood  pressure and take her furosemide as with her swelling shortness of breath and weight gain she has not taking any of her furosemide prior to her appointment.  4.  Disposition patient return to clinic to see MD/APP in 1 week or sooner if needed as well as with a BMP on return  Gram Siedlecki, NP 02/26/2022, 3:53 PM

## 2022-02-26 NOTE — Patient Instructions (Signed)
Medication Instructions:   Your physician has recommended you make the following change in your medication:    INCREASE your Metorprolol Tartrate to 150 MG TWICE A DAY.  2.   Take your Furosemide (Lasix) ONCE A DAY for 5 days.   *If you need a refill on your cardiac medications before your next appointment, please call your pharmacy*   Lab Work:  Your physician recommends that you return for lab work (BMP) in:  Please go to [facility] prior to your appointment next week for a lab draw, so we will have the results for the visit.  - Please go to the [facility] You will check in at the front desk to the right as you walk into the atrium. Valet Parking is offered if needed. - No appointment needed. You may go any day between 7 am and 6 pm.   Follow-Up: At [facility], you and your health needs are our priority.  As part of our continuing mission to provide you with exceptional heart care, we have created designated Provider Care Teams.  These Care Teams include your primary Cardiologist (physician) and Advanced Practice Providers (APPs -  Physician Assistants and Nurse Practitioners) who all work together to provide you with the care you need, when you need it.  We recommend signing up for the patient portal called "MyChart".  Sign up information is provided on this After Visit Summary.  MyChart is used to connect with patients for Virtual Visits (Telemedicine).  Patients are able to view lab/test results, encounter notes, upcoming appointments, etc.  Non-urgent messages can be sent to your provider as well.   To learn more about what you can do with MyChart, go to [email or web address].    Your next appointment:   1 week(s)  The format for your next appointment:   In Person  Provider:   You may see Debbe Odea, MD or one of the following Advanced Practice Providers on your designated Care Team:   Nicolasa Ducking, NP Eula Listen, PA-C Cadence Fransico Michael,  PA-C Charlsie Quest, NP    Other Instructions   Important Information About Sugar

## 2022-02-26 NOTE — Telephone Encounter (Signed)
Responded to patients MyChart message and scheduled her to see Charlsie Quest today at 3:35.

## 2022-03-02 ENCOUNTER — Other Ambulatory Visit: Payer: Self-pay

## 2022-03-02 ENCOUNTER — Inpatient Hospital Stay: Payer: Medicare HMO

## 2022-03-02 ENCOUNTER — Emergency Department: Payer: Medicare HMO

## 2022-03-02 ENCOUNTER — Inpatient Hospital Stay (HOSPITAL_COMMUNITY)
Admit: 2022-03-02 | Discharge: 2022-03-02 | Disposition: A | Discharge status: Home / Self Care | Payer: Medicare HMO | Attending: Internal Medicine | Admitting: Internal Medicine

## 2022-03-02 ENCOUNTER — Inpatient Hospital Stay
Admission: EM | Admit: 2022-03-02 | Discharge: 2022-03-06 | DRG: 308 | Disposition: A | Discharge status: Home / Self Care | Payer: Medicare HMO | Attending: Osteopathic Medicine | Admitting: Osteopathic Medicine

## 2022-03-02 DIAGNOSIS — I361 Nonrheumatic tricuspid (valve) insufficiency: Secondary | ICD-10-CM

## 2022-03-02 DIAGNOSIS — Z885 Allergy status to narcotic agent status: Secondary | ICD-10-CM

## 2022-03-02 DIAGNOSIS — F411 Generalized anxiety disorder: Secondary | ICD-10-CM | POA: Diagnosis present

## 2022-03-02 DIAGNOSIS — Z8673 Personal history of transient ischemic attack (TIA), and cerebral infarction without residual deficits: Secondary | ICD-10-CM | POA: Diagnosis not present

## 2022-03-02 DIAGNOSIS — Z79899 Other long term (current) drug therapy: Secondary | ICD-10-CM | POA: Diagnosis not present

## 2022-03-02 DIAGNOSIS — I5033 Acute on chronic diastolic (congestive) heart failure: Secondary | ICD-10-CM | POA: Diagnosis not present

## 2022-03-02 DIAGNOSIS — Z6836 Body mass index (BMI) 36.0-36.9, adult: Secondary | ICD-10-CM

## 2022-03-02 DIAGNOSIS — I5031 Acute diastolic (congestive) heart failure: Secondary | ICD-10-CM | POA: Diagnosis not present

## 2022-03-02 DIAGNOSIS — Z888 Allergy status to other drugs, medicaments and biological substances status: Secondary | ICD-10-CM | POA: Diagnosis not present

## 2022-03-02 DIAGNOSIS — E669 Obesity, unspecified: Secondary | ICD-10-CM | POA: Diagnosis not present

## 2022-03-02 DIAGNOSIS — K219 Gastro-esophageal reflux disease without esophagitis: Secondary | ICD-10-CM | POA: Diagnosis present

## 2022-03-02 DIAGNOSIS — R748 Abnormal levels of other serum enzymes: Secondary | ICD-10-CM | POA: Diagnosis not present

## 2022-03-02 DIAGNOSIS — F32A Depression, unspecified: Secondary | ICD-10-CM | POA: Diagnosis present

## 2022-03-02 DIAGNOSIS — I110 Hypertensive heart disease with heart failure: Secondary | ICD-10-CM | POA: Diagnosis present

## 2022-03-02 DIAGNOSIS — I4891 Unspecified atrial fibrillation: Secondary | ICD-10-CM | POA: Diagnosis not present

## 2022-03-02 DIAGNOSIS — I5042 Chronic combined systolic (congestive) and diastolic (congestive) heart failure: Secondary | ICD-10-CM

## 2022-03-02 DIAGNOSIS — I10 Essential (primary) hypertension: Secondary | ICD-10-CM

## 2022-03-02 DIAGNOSIS — Z833 Family history of diabetes mellitus: Secondary | ICD-10-CM | POA: Diagnosis not present

## 2022-03-02 DIAGNOSIS — G4733 Obstructive sleep apnea (adult) (pediatric): Secondary | ICD-10-CM | POA: Diagnosis present

## 2022-03-02 DIAGNOSIS — Z8249 Family history of ischemic heart disease and other diseases of the circulatory system: Secondary | ICD-10-CM

## 2022-03-02 DIAGNOSIS — I420 Dilated cardiomyopathy: Secondary | ICD-10-CM | POA: Diagnosis not present

## 2022-03-02 DIAGNOSIS — Z6841 Body Mass Index (BMI) 40.0 and over, adult: Secondary | ICD-10-CM

## 2022-03-02 DIAGNOSIS — Z7901 Long term (current) use of anticoagulants: Secondary | ICD-10-CM

## 2022-03-02 DIAGNOSIS — I5032 Chronic diastolic (congestive) heart failure: Secondary | ICD-10-CM | POA: Diagnosis not present

## 2022-03-02 DIAGNOSIS — E876 Hypokalemia: Secondary | ICD-10-CM | POA: Diagnosis not present

## 2022-03-02 DIAGNOSIS — Z7985 Long-term (current) use of injectable non-insulin antidiabetic drugs: Secondary | ICD-10-CM

## 2022-03-02 DIAGNOSIS — I480 Paroxysmal atrial fibrillation: Secondary | ICD-10-CM | POA: Diagnosis not present

## 2022-03-02 DIAGNOSIS — I5020 Unspecified systolic (congestive) heart failure: Secondary | ICD-10-CM | POA: Diagnosis not present

## 2022-03-02 DIAGNOSIS — R69 Illness, unspecified: Secondary | ICD-10-CM | POA: Diagnosis not present

## 2022-03-02 DIAGNOSIS — E119 Type 2 diabetes mellitus without complications: Secondary | ICD-10-CM | POA: Diagnosis not present

## 2022-03-02 DIAGNOSIS — I5043 Acute on chronic combined systolic (congestive) and diastolic (congestive) heart failure: Secondary | ICD-10-CM | POA: Diagnosis present

## 2022-03-02 DIAGNOSIS — I4819 Other persistent atrial fibrillation: Secondary | ICD-10-CM | POA: Diagnosis not present

## 2022-03-02 DIAGNOSIS — J90 Pleural effusion, not elsewhere classified: Secondary | ICD-10-CM | POA: Diagnosis not present

## 2022-03-02 DIAGNOSIS — R600 Localized edema: Secondary | ICD-10-CM | POA: Diagnosis not present

## 2022-03-02 DIAGNOSIS — Z96653 Presence of artificial knee joint, bilateral: Secondary | ICD-10-CM | POA: Diagnosis not present

## 2022-03-02 DIAGNOSIS — R7401 Elevation of levels of liver transaminase levels: Secondary | ICD-10-CM | POA: Diagnosis not present

## 2022-03-02 DIAGNOSIS — R0602 Shortness of breath: Secondary | ICD-10-CM | POA: Diagnosis not present

## 2022-03-02 LAB — CBC WITH DIFFERENTIAL/PLATELET
Abs Immature Granulocytes: 0.04 10*3/uL (ref 0.00–0.07)
Basophils Absolute: 0 10*3/uL (ref 0.0–0.1)
Basophils Relative: 0 %
Eosinophils Absolute: 0.1 10*3/uL (ref 0.0–0.5)
Eosinophils Relative: 1 %
HCT: 40.1 % (ref 36.0–46.0)
Hemoglobin: 13.1 g/dL (ref 12.0–15.0)
Immature Granulocytes: 0 %
Lymphocytes Relative: 14 %
Lymphs Abs: 1.3 10*3/uL (ref 0.7–4.0)
MCH: 29.5 pg (ref 26.0–34.0)
MCHC: 32.7 g/dL (ref 30.0–36.0)
MCV: 90.3 fL (ref 80.0–100.0)
Monocytes Absolute: 0.5 10*3/uL (ref 0.1–1.0)
Monocytes Relative: 6 %
Neutro Abs: 7 10*3/uL (ref 1.7–7.7)
Neutrophils Relative %: 79 %
Platelets: 262 10*3/uL (ref 150–400)
RBC: 4.44 MIL/uL (ref 3.87–5.11)
RDW: 15.7 % — ABNORMAL HIGH (ref 11.5–15.5)
WBC: 9 10*3/uL (ref 4.0–10.5)
nRBC: 0 % (ref 0.0–0.2)

## 2022-03-02 LAB — COMPREHENSIVE METABOLIC PANEL
ALT: 75 U/L — ABNORMAL HIGH (ref 0–44)
AST: 48 U/L — ABNORMAL HIGH (ref 15–41)
Albumin: 3.4 g/dL — ABNORMAL LOW (ref 3.5–5.0)
Alkaline Phosphatase: 83 U/L (ref 38–126)
Anion gap: 6 (ref 5–15)
BUN: 12 mg/dL (ref 8–23)
CO2: 22 mmol/L (ref 22–32)
Calcium: 8.6 mg/dL — ABNORMAL LOW (ref 8.9–10.3)
Chloride: 111 mmol/L (ref 98–111)
Creatinine, Ser: 0.82 mg/dL (ref 0.44–1.00)
GFR, Estimated: >60 mL/min (ref >60)
Glucose, Bld: 110 mg/dL — ABNORMAL HIGH (ref 70–99)
Potassium: 4.4 mmol/L (ref 3.5–5.1)
Sodium: 139 mmol/L (ref 135–145)
Total Bilirubin: 1.1 mg/dL (ref 0.3–1.2)
Total Protein: 6.5 g/dL (ref 6.5–8.1)

## 2022-03-02 LAB — ECHOCARDIOGRAM COMPLETE
AR max vel: 2.08 cm2
AV Peak grad: 4.8 mmHg
Ao pk vel: 1.09 m/s
Area-P 1/2: 4.93 cm2
Height: 68 in
S' Lateral: 3.4 cm
Single Plane A4C EF: 53.2 %
Weight: 4240 oz

## 2022-03-02 LAB — TROPONIN I (HIGH SENSITIVITY)
Troponin I (High Sensitivity): 5 ng/L (ref <18)
Troponin I (High Sensitivity): 6 ng/L (ref <18)

## 2022-03-02 LAB — MAGNESIUM: Magnesium: 2 mg/dL (ref 1.7–2.4)

## 2022-03-02 LAB — BRAIN NATRIURETIC PEPTIDE: B Natriuretic Peptide: 577.9 pg/mL — ABNORMAL HIGH (ref 0.0–100.0)

## 2022-03-02 LAB — TSH: TSH: 1.359 u[IU]/mL (ref 0.350–4.500)

## 2022-03-02 MED ORDER — FUROSEMIDE 40 MG PO TABS: 20.0000 mg | ORAL_TABLET | Freq: Every day | ORAL | Status: DC

## 2022-03-02 MED ORDER — DILTIAZEM HCL 30 MG PO TABS
45.0000 mg | ORAL_TABLET | Freq: Four times a day (QID) | ORAL | Status: DC
  Administered 2022-03-02 – 2022-03-05 (×13): 45 mg via ORAL
  Filled 2022-03-02 (×4): qty 2
  Filled 2022-03-02: qty 0.5
  Filled 2022-03-02 (×6): qty 2
  Filled 2022-03-02: qty 0.5
  Filled 2022-03-02 (×3): qty 2

## 2022-03-02 MED ORDER — ATORVASTATIN CALCIUM 10 MG PO TABS
10.0000 mg | ORAL_TABLET | Freq: Every day | ORAL | Status: DC
  Administered 2022-03-03 – 2022-03-06 (×4): 10 mg via ORAL
  Filled 2022-03-02 (×4): qty 1

## 2022-03-02 MED ORDER — FUROSEMIDE 10 MG/ML IJ SOLN
20.0000 mg | Freq: Two times a day (BID) | INTRAMUSCULAR | Status: DC
  Administered 2022-03-03 – 2022-03-04 (×3): 20 mg via INTRAVENOUS
  Filled 2022-03-02 (×3): qty 2

## 2022-03-02 MED ORDER — FUROSEMIDE 10 MG/ML IJ SOLN
40.0000 mg | Freq: Once | INTRAMUSCULAR | Status: AC
  Administered 2022-03-02: 40 mg via INTRAVENOUS
  Filled 2022-03-02: qty 4

## 2022-03-02 MED ORDER — APIXABAN 5 MG PO TABS
5.0000 mg | ORAL_TABLET | Freq: Two times a day (BID) | ORAL | Status: DC
  Administered 2022-03-02 – 2022-03-06 (×8): 5 mg via ORAL
  Filled 2022-03-02 (×8): qty 1

## 2022-03-02 MED ORDER — TRAMADOL HCL 50 MG PO TABS: 50.0000 mg | ORAL_TABLET | Freq: Four times a day (QID) | ORAL | Status: DC | PRN

## 2022-03-02 MED ORDER — HYDROXYZINE HCL 25 MG PO TABS
12.5000 mg | ORAL_TABLET | Freq: Every evening | ORAL | Status: DC | PRN
  Filled 2022-03-02: qty 1

## 2022-03-02 MED ORDER — IRBESARTAN 150 MG PO TABS: 300.0000 mg | ORAL_TABLET | Freq: Every day | ORAL | Status: DC

## 2022-03-02 MED ORDER — ADULT MULTIVITAMIN W/MINERALS CH
1.0000 | ORAL_TABLET | Freq: Every day | ORAL | Status: DC
  Administered 2022-03-03 – 2022-03-06 (×4): 1 via ORAL
  Filled 2022-03-02 (×4): qty 1

## 2022-03-02 MED ORDER — METOPROLOL TARTRATE 50 MG PO TABS
150.0000 mg | ORAL_TABLET | Freq: Two times a day (BID) | ORAL | Status: DC
  Administered 2022-03-02 – 2022-03-06 (×8): 150 mg via ORAL
  Filled 2022-03-02 (×8): qty 3

## 2022-03-02 MED ORDER — DILTIAZEM HCL-DEXTROSE 125-5 MG/125ML-% IV SOLN (PREMIX)
5.0000 mg/h | INTRAVENOUS | Status: DC
  Administered 2022-03-02 – 2022-03-03 (×2): 5 mg/h via INTRAVENOUS
  Administered 2022-03-03: 7.5 mg/h via INTRAVENOUS
  Filled 2022-03-02 (×3): qty 125

## 2022-03-02 MED ORDER — IRBESARTAN 150 MG PO TABS
150.0000 mg | ORAL_TABLET | Freq: Every day | ORAL | Status: DC
  Administered 2022-03-03: 150 mg via ORAL
  Filled 2022-03-02: qty 1

## 2022-03-02 MED ORDER — DILTIAZEM HCL 25 MG/5ML IV SOLN
10.0000 mg | Freq: Once | INTRAVENOUS | Status: AC
  Administered 2022-03-02: 10 mg via INTRAVENOUS
  Filled 2022-03-02: qty 5

## 2022-03-02 MED ORDER — VENLAFAXINE HCL ER 75 MG PO CP24
75.0000 mg | ORAL_CAPSULE | Freq: Every day | ORAL | Status: DC
  Administered 2022-03-03 – 2022-03-06 (×4): 75 mg via ORAL
  Filled 2022-03-02 (×4): qty 1

## 2022-03-02 MED ORDER — ONDANSETRON HCL 4 MG/2ML IJ SOLN: 4.0000 mg | Freq: Four times a day (QID) | INTRAMUSCULAR | Status: DC | PRN

## 2022-03-02 MED ORDER — VITAMIN D (ERGOCALCIFEROL) 1.25 MG (50000 UNIT) PO CAPS
50000.0000 [IU] | ORAL_CAPSULE | ORAL | Status: DC
  Administered 2022-03-03: 50000 [IU] via ORAL
  Filled 2022-03-02: qty 1

## 2022-03-02 MED ORDER — ONDANSETRON HCL 4 MG PO TABS: 4.0000 mg | ORAL_TABLET | Freq: Four times a day (QID) | ORAL | Status: DC | PRN

## 2022-03-02 MED ORDER — VENLAFAXINE HCL ER 37.5 MG PO CP24
37.5000 mg | ORAL_CAPSULE | Freq: Every day | ORAL | Status: DC
  Administered 2022-03-03 – 2022-03-06 (×4): 37.5 mg via ORAL
  Filled 2022-03-02 (×4): qty 1

## 2022-03-02 MED ORDER — GABAPENTIN 100 MG PO CAPS
100.0000 mg | ORAL_CAPSULE | Freq: Every day | ORAL | Status: DC
  Administered 2022-03-02 – 2022-03-05 (×4): 100 mg via ORAL
  Filled 2022-03-02 (×4): qty 1

## 2022-03-02 MED ORDER — FAMOTIDINE 20 MG PO TABS: 20.0000 mg | ORAL_TABLET | Freq: Every day | ORAL | Status: DC | PRN

## 2022-03-02 MED ORDER — ACETAMINOPHEN 325 MG PO TABS
650.0000 mg | ORAL_TABLET | Freq: Three times a day (TID) | ORAL | Status: DC | PRN
  Administered 2022-03-02 – 2022-03-05 (×5): 650 mg via ORAL
  Filled 2022-03-02 (×5): qty 2

## 2022-03-02 NOTE — ED Provider Notes (Signed)
[facility] Provider Note    Event Date/Time   First MD Initiated Contact with Patient 03/02/22 1004     (approximate)   History   Chief Complaint Palpitations  HPI  Regina Christensen is a 66 y.o. female with past medical history of hypertension, diabetes, paroxysmal atrial fibrillation on Eliquis, diastolic CHF, stroke, and OSA who presents to the ED complaining palpitations.  Patient reports that over the past week she has been dealing with a persistent feeling that her heart has been racing.  She denies any associated pain in her chest, but states that she gets very out of breath with any exertion.  Over the past 24 hours, she has noticed that she cannot lay flat on her back or on her left side, due to the difficulty breathing.  She has noticed increased swelling in both of her legs but denies any pain in her legs.  She was seen by her cardiologist for this 4 days ago, when they told her to increase her metoprolol and take extra Lasix.  She states that she has done this with no improvement in her symptoms.  She reports that she has been compliant with her Eliquis.     Physical Exam   Triage Vital Signs: ED Triage Vitals [03/02/22 0959]  Enc Vitals Group     BP      Pulse      Resp      Temp      Temp src      SpO2      Weight 265 lb (120.2 kg)     Height 5\' 8"  (1.727 m)     Head Circumference      Peak Flow      Pain Score 0     Pain Loc      Pain Edu?      Excl. in GC?     Most recent vital signs: Vitals:   03/02/22 1035 03/02/22 1039  BP: (!) 142/87   Pulse:    Resp: 19   Temp:  98.5 F (36.9 C)  SpO2:      Constitutional: Alert and oriented. Eyes: Conjunctivae are normal. Head: Atraumatic. Nose: No congestion/rhinnorhea. Mouth/Throat: Mucous membranes are moist.  Cardiovascular: Tachycardic, irregularly irregular rhythm. Grossly normal heart sounds.  2+ radial pulses bilaterally. Respiratory: Normal respiratory effort.  No  retractions. Lungs CTAB. Gastrointestinal: Soft and nontender. No distention. Musculoskeletal: No lower extremity tenderness nor edema.  Neurologic:  Normal speech and language. No gross focal neurologic deficits are appreciated.    ED Results / Procedures / Treatments   Labs (all labs ordered are listed, but only abnormal results are displayed) Labs Reviewed  CBC WITH DIFFERENTIAL/PLATELET - Abnormal; Notable for the following components:      Result Value   RDW 15.7 (*)    All other components within normal limits  COMPREHENSIVE METABOLIC PANEL - Abnormal; Notable for the following components:   Glucose, Bld 110 (*)    Calcium 8.6 (*)    Albumin 3.4 (*)    AST 48 (*)    ALT 75 (*)    All other components within normal limits  BRAIN NATRIURETIC PEPTIDE - Abnormal; Notable for the following components:   B Natriuretic Peptide 577.9 (*)    All other components within normal limits  MAGNESIUM  TROPONIN I (HIGH SENSITIVITY)     EKG  ED ECG REPORT I, 13/12/23, the attending physician, personally viewed and interpreted this ECG.  Date: 03/02/2022  EKG Time: 10:00  Rate: 142  Rhythm: atrial fibrillation  Axis: Normal  Intervals:none  ST&T Change: Nonspecific T wave changes  RADIOLOGY Chest x-ray reviewed and interpreted by me with no infiltrate, edema, or effusion.  PROCEDURES:  Critical Care performed: Yes, see critical care procedure note(s)  .Critical Care  Performed by: Chesley Noon, MD Authorized by: Chesley Noon, MD   Critical care provider statement:    Critical care time (minutes):  30   Critical care time was exclusive of:  Separately billable procedures and treating other patients and teaching time   Critical care was necessary to treat or prevent imminent or life-threatening deterioration of the following conditions:  Cardiac failure   Critical care was time spent personally by me on the following activities:  Development of treatment plan  with patient or surrogate, discussions with consultants, evaluation of patient's response to treatment, examination of patient, ordering and review of laboratory studies, ordering and review of radiographic studies, ordering and performing treatments and interventions, pulse oximetry, re-evaluation of patient's condition and review of old charts   I assumed direction of critical care for this patient from another provider in my specialty: no     Care discussed with: admitting provider      MEDICATIONS ORDERED IN ED: Medications  diltiazem (CARDIZEM) 125 mg in dextrose 5% 125 mL (1 mg/mL) infusion (7.5 mg/hr Intravenous Rate/Dose Change 03/02/22 1108)  diltiazem (CARDIZEM) injection 10 mg (10 mg Intravenous Given 03/02/22 1056)     IMPRESSION / MDM / ASSESSMENT AND PLAN / ED COURSE  I reviewed the triage vital signs and the nursing notes.                              66 y.o. female with past medical history of hypertension, diabetes, paroxysmal atrial fibrillation on Eliquis, diastolic CHF, stroke, and OSA who presents to the ED complaining of palpitations and difficulty breathing over the past week.  Patient's presentation is most consistent with acute presentation with potential threat to life or bodily function.  Differential diagnosis includes, but is not limited to, ACS, PE, pneumonia, atrial fibrillation, CHF exacerbation, electrolyte abnormality, AKI.  Patient nontoxic-appearing and in no acute distress, vital signs remarkable for tachycardia but otherwise reassuring.  EKG shows atrial fibrillation with RVR, no associated ischemic changes noted.  Patient continues to be in RVR once in a room, blood pressure remained stable and we will attempt to control heart rate with diltiazem drip.  She does have signs of fluid overload and CHF exacerbation, will further assess with chest x-ray.  Chest x-ray is unremarkable and labs are reassuring with no significant anemia, leukocytosis,  electrolyte abnormality, or AKI.  Magnesium and troponin are also within normal limits.  Heart rate appears improved following IV diltiazem bolus and drip.  Case discussed with hospitalist for admission for further rate control.      FINAL CLINICAL IMPRESSION(S) / ED DIAGNOSES   Final diagnoses:  Atrial fibrillation with RVR (HCC)     Rx / DC Orders   ED Discharge Orders     None        Note:  This document was prepared using Dragon voice recognition software and may include unintentional dictation errors.   Chesley Noon, MD 03/02/22 1125

## 2022-03-02 NOTE — ED Notes (Signed)
Advised nurse that patient has ready bed 

## 2022-03-02 NOTE — Assessment & Plan Note (Signed)
BMI 40.29 Complicates overall prognosis and care Lifestyle modification and exercise has been discussed with patient in detail

## 2022-03-02 NOTE — ED Notes (Signed)
Echo at bedside

## 2022-03-02 NOTE — Assessment & Plan Note (Signed)
Patient has a known history of paroxysmal A-fib and presents to the ER for evaluation of palpitations, exertional shortness of breath and chest pain. She was seen in the outpatient setting and her metoprolol was increased to 150 mg twice daily without any improvement in her symptoms. Patient has been referred to EP for evaluation for catheter ablation as a long-term rhythm control strategy Continue Cardizem drip initiated in the ER Continue metoprolol Continue Eliquis as primary prophylaxis for an acute stroke Cardiology consult

## 2022-03-02 NOTE — Assessment & Plan Note (Addendum)
Most likely rate related Last 2D echocardiogram was from 2021 and showed an LVEF of 60 to 65%.  Continue IV furosemide, metoprolol and Avapro Repeat 2D echocardiogram

## 2022-03-02 NOTE — ED Notes (Signed)
Dr. Joylene Igo, MD at bedside for evaluation.

## 2022-03-02 NOTE — Consult Note (Signed)
Cardiology Consultation   Patient ID: Regina Christensen MRN: [id number]; DOB: 1956/03/04  Admit date: 03/02/2022 Date of Consult: 03/02/2022  PCP:  Steele Sizer, MD   [place] Providers Cardiologist: Lambert/Agbor-etang Reason for consult: Atrial fibrillation with RVR, shortness of breath Physician requesting consult: Dr. Francine Graven  Patient Profile:   Regina Christensen is a 66 y.o. female with a hx of paroxysmal atrial fibrillation, morbid obesity, stroke November 2021, sleep apnea not on CPAP presenting with worsening shortness of breath, tachypalpitations  History of Present Illness:   Ms. Eckard reports that she was recently seen in cardiology clinic this past week, reported having 10 pound weight gain At that time it was recommended that she stay on her metoprolol tartrate 150 twice daily, start Lasix daily Seen by primary cardiologist December 30, 2021, referred to EP Was seen by EP February 19, 2022, had discussion concerning ablation versus antiarrhythmic therapy, she preferred to check with her family in follow-up phone calls she indicated that she was interested in ablation and this has been scheduled for February 2023  Despite taking Lasix and metoprolol, she reports continued shortness of breath symptoms and tachycardia Significant shortness of breath on any exertion, has to sit down to rest She has appreciated lower extremity edema, abdominal fullness Compliant with her Eliquis  In emergency room started on diltiazem infusion  Past Medical History:  Diagnosis Date   Anxiety    Arthritis    Back pain    BPPV (benign paroxysmal positional vertigo)    Chronic diastolic HF (heart failure) ([place])    Complication of anesthesia    Post-operative hypoxia requiring supplemental oxygen   Current use of long term anticoagulation    Apixaban   CVA (cerebral vascular accident) ([place]) 02/28/2020   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; imaging from [facility]    Depression    Edema of both lower extremities    GERD (gastroesophageal reflux disease)    Hypertension    IBS (irritable bowel syndrome)    Lactose intolerance    Morbid obesity with BMI of 45.0-49.9, adult ([place])    Obesity    Osteoarthritis    PAF (paroxysmal atrial fibrillation) ([place])    Pre-diabetes    Sleep apnea    uses CPAP machine sometimes    Type 2 diabetes mellitus without complication ([place])    Vitamin D deficiency     Past Surgical History:  Procedure Laterality Date   ABDOMINAL HYSTERECTOMY     BREAST EXCISIONAL BIOPSY Left    ENDOMETRIAL ABLATION     x2   ENDOSCOPIC CONCHA BULLOSA RESECTION Bilateral 08/08/2020   Procedure: ENDOSCOPIC CONCHA BULLOSA RESECTION;  Surgeon: Margaretha Sheffield, MD;  Location: [facility] ORS;  Service: ENT;  Laterality: Bilateral;   ETHMOIDECTOMY Right 08/08/2020   Procedure: ETHMOIDECTOMY, LEFT PARTIAL ETHMOIDECTOMY;  Surgeon: Margaretha Sheffield, MD;  Location: [facility] OR[facility];  Service: ENT;  Laterality: Right;   IMAGE GUIDED SINUS SURGERY N/A 08/08/2020   Procedure: IMAGE GUIDED SINUS SURGERY, RIGHT FRONTAL SINUSOTOMY;  Surgeon: Margaretha Sheffield, MD;  Location: [facility];  Service: ENT;  Laterality: N/A;   LAPAROSCOPIC SLEEVE GASTRECTOMY     MAXILLARY ANTROSTOMY Bilateral 08/08/2020   Procedure: MAXILLARY ANTROSTOMY with tissue;  Surgeon: Margaretha Sheffield, MD;  Location: [facility] OR[facility];  Service: ENT;  Laterality: Bilateral;   NASAL TURBINATE REDUCTION  08/27/2020   Procedure: TURBINATE REDUCTION /SUBMUCOSAL DEBRIDEMENT;  Surgeon: Margaretha Sheffield, MD;  Location: [facility] OR[facility];  Service: ENT;;   TOTAL KNEE ARTHROPLASTY Right  06/2019   TOTAL KNEE ARTHROPLASTY Left 01/31/2021   [facility]     Home Medications:  Prior to Admission medications   Medication Sig Start Date End Date Taking? Authorizing Provider  amLODipine (NORVASC) 5 MG tablet Take 1 tablet (5 mg total) by mouth daily. 01/03/22  Yes Agbor-Etang, Arlys John, MD  apixaban (ELIQUIS) 5 MG TABS  tablet Take 1 tablet (5 mg total) by mouth 2 (two) times daily. 01/07/22  Yes Agbor-Etang, Arlys John, MD  atorvastatin (LIPITOR) 10 MG tablet Take 1 tablet (10 mg total) by mouth daily. 01/22/22  Yes Sowles, Danna Hefty, MD  famotidine (PEPCID) 20 MG tablet Take 1 tablet (20 mg total) by mouth daily as needed for indigestion. 01/22/22  Yes Sowles, Danna Hefty, MD  furosemide (LASIX) 20 MG tablet Take 1 tablet (20 mg total) by mouth daily as needed. Take 20 MG (one tab) daily for 5 days. 02/26/22  Yes Hammock, Sheri, NP  gabapentin (NEURONTIN) 100 MG capsule Take 100 mg by mouth at bedtime. 12/20/21  Yes [provider]  metoprolol tartrate (LOPRESSOR) 100 MG tablet Take 1.5 tablets (150 mg total) by mouth 2 (two) times daily. 02/26/22  Yes Hammock, Lavonna Rua, NP  Multiple Vitamin (MULTIVITAMIN) tablet Take 1 tablet by mouth daily. BariatricPal Multivitamin   Yes [provider]  olmesartan (BENICAR) 40 MG tablet TAKE 1 TABLET BY MOUTH EVERY DAY 02/14/22  Yes Sowles, Danna Hefty, MD  potassium chloride (KLOR-CON M) 10 MEQ tablet Take 2 tablets (20 mEq total) by mouth daily. Take with Lasix 08/06/21  Yes Aguilar, Tracey, PA-C  Semaglutide, 2 MG/DOSE, (OZEMPIC, 2 MG/DOSE,) 8 MG/3ML SOPN Inject 2 mg into the skin once a week. 11/14/21  Yes Danford, Orpha Bur D, NP  triamcinolone (NASACORT) 55 MCG/ACT AERO nasal inhaler Place 2 sprays into the nose daily.   Yes [provider]  venlafaxine XR (EFFEXOR-XR) 37.5 MG 24 hr capsule Take 1 capsule (37.5 mg total) by mouth daily with breakfast. Take along with 75 mg daily 09/27/21  Yes Eappen, Levin Bacon, MD  venlafaxine XR (EFFEXOR-XR) 75 MG 24 hr capsule Take 1 capsule (75 mg total) by mouth daily with breakfast. Take along with 37.5 mg daily 09/27/21  Yes Eappen, Levin Bacon, MD  acetaminophen (TYLENOL) 650 MG CR tablet Take 1 tablet (650 mg total) by mouth every 8 (eight) hours as needed for pain. 08/20/20   Rodolph Bong, MD  benzonatate (TESSALON) 100 MG capsule Take 2  capsules (200 mg total) by mouth 2 (two) times daily as needed for cough. 02/12/22   Berniece Salines, FNP  hydrOXYzine (ATARAX) 25 MG tablet TAKE 0.5-1 TABLETS (12.5-25 MG TOTAL) BY MOUTH AT BEDTIME AS NEEDED. FOR SLEEP 10/21/21   Jomarie Longs, MD  meclizine (ANTIVERT) 12.5 MG tablet Take 1 tablet (12.5 mg total) by mouth 3 (three) times daily as needed for dizziness. 06/06/20   [name], MD  Vitamin D, Ergocalciferol, (DRISDOL) 1.25 MG (50000 UNIT) CAPS capsule Take 1 capsule (50,000 Units total) by mouth every 7 (seven) days. 11/14/21   Danford, Orpha Bur D, NP  XIIDRA 5 % SOLN Place 1 drop into both eyes 2 (two) times daily. Patient not taking: Reported on 03/02/2022 02/03/17   [provider]    Inpatient Medications: Scheduled Meds:  apixaban  5 mg Oral BID   [START ON 03/03/2022] atorvastatin  10 mg Oral Daily   diltiazem  45 mg Oral Q6H   [START ON 03/03/2022] furosemide  20 mg Intravenous BID   furosemide  40  mg Intravenous Once   gabapentin  100 mg Oral QHS   [START ON 03/03/2022] irbesartan  150 mg Oral Daily   metoprolol tartrate  150 mg Oral BID   [START ON 03/03/2022] multivitamin with minerals  1 tablet Oral Daily   [START ON 03/03/2022] venlafaxine XR  37.5 mg Oral Q breakfast   [START ON 03/03/2022] venlafaxine XR  75 mg Oral Q breakfast   [START ON 03/03/2022] Vitamin D (Ergocalciferol)  50,000 Units Oral Q7 days   Continuous Infusions:  diltiazem (CARDIZEM) infusion 7.5 mg/hr (03/02/22 1108)   PRN Meds: acetaminophen, famotidine, hydrOXYzine, ondansetron **OR** ondansetron (ZOFRAN) IV  Allergies:    Allergies  Allergen Reactions   Hydrocodone-Acetaminophen Other (See Comments)    Extreme headache   Ace Inhibitors Cough   Hctz [Hydrochlorothiazide] Rash    face    Social History:   Social History   Socioeconomic History   Marital status: Divorced    Spouse name: Not on file   Number of children: 2   Years of education: Not on file   Highest  education level: Associate degree: occupational, Scientist, product/process development, or vocational program  Occupational History   Occupation: Retired/ work PT    Employer: [employer]  Tobacco Use   Smoking status: Never   Smokeless tobacco: Never  Vaping Use   Vaping Use: Never used  Substance and Sexual Activity   Alcohol use: No    Alcohol/week: 0.0 standard drinks of alcohol    Comment: rare   Drug use: No   Sexual activity: Not on file  Other Topics Concern   Not on file  Social History Narrative   Not on file   Social Determinants of Health   Financial Resource Strain: High Risk (01/22/2022)   Overall Financial Resource Strain (CARDIA)    Difficulty of Paying Living Expenses: Very hard  Food Insecurity: No Food Insecurity (10/18/2021)   Hunger Vital Sign    Worried About Running Out of Food in the Last Year: Never true    Ran Out of Food in the Last Year: Never true  Transportation Needs: No Transportation Needs (01/22/2022)   PRAPARE - Administrator, Civil Service (Medical): No    Lack of Transportation (Non-Medical): No  Physical Activity: Insufficiently Active (10/18/2021)   Exercise Vital Sign    Days of Exercise per Week: 3 days    Minutes of Exercise per Session: 10 min  Stress: No Stress Concern Present (10/18/2021)   [employer] of Occupational Health - Occupational Stress Questionnaire    Feeling of Stress : Only a little  Social Connections: Moderately Integrated (10/18/2021)   Social Connection and Isolation Panel [NHANES]    Frequency of Communication with Friends and Family: More than three times a week    Frequency of Social Gatherings with Friends and Family: More than three times a week    Attends Religious Services: More than 4 times per year    Active Member of [employer] or Organizations: Yes    Attends Banker Meetings: Never    Marital Status: Divorced  Catering manager Violence: Not At Risk (10/18/2021)   Humiliation, Afraid, Rape, and Kick  questionnaire    Fear of Current or Ex-Partner: No    Emotionally Abused: No    Physically Abused: No    Sexually Abused: No    Family History:    Family History  Problem Relation Age of Onset   Diabetes Mother    High blood pressure Mother  Heart disease Father    Cancer Brother    Mental illness Neg Hx      ROS:  Please see the history of present illness.  Review of Systems  Constitutional: Negative.   HENT: Negative.    Respiratory:  Positive for shortness of breath.   Cardiovascular:  Positive for palpitations.  Gastrointestinal: Negative.   Musculoskeletal: Negative.   Neurological: Negative.   Psychiatric/Behavioral: Negative.    All other systems reviewed and are negative.   Physical Exam/Data:   Vitals:   03/02/22 1200 03/02/22 1215 03/02/22 1230 03/02/22 1245  BP:   (!) 123/93   Pulse: 77 83 70 74  Resp: (!) 22 10 (!) 22 (!) 21  Temp:      TempSrc:      SpO2: 98% 100% 98% 97%  Weight:      Height:       No intake or output data in the 24 hours ending 03/02/22 1500    03/02/2022    9:59 AM 02/26/2022    3:37 PM 02/19/2022    8:14 AM  Last 3 Weights  Weight (lbs) 265 lb 265 lb 6 oz 255 lb  Weight (kg) 120.203 kg 120.373 kg 115.667 kg     Body mass index is 40.29 kg/m.  General:  Well nourished, well developed, in no acute distress HEENT: normal Neck: no JVD Vascular: No carotid bruits; Distal pulses 2+ bilaterally Cardiac:  normal S1, S2; RRR; no murmur  Lungs:  clear to auscultation bilaterally, no wheezing, rhonchi or rales  Abd: soft, nontender, no hepatomegaly  Ext: no edema Musculoskeletal:  No deformities, BUE and BLE strength normal and equal Skin: warm and dry  Neuro:  CNs 2-12 intact, no focal abnormalities noted Psych:  Normal affect    EKG:  The EKG was personally reviewed and demonstrates:   Atrial fibrillation with ventricular rate 142 bpm, nonspecific ST and T wave abnormality  Telemetry:  Telemetry was personally reviewed  and demonstrates:   Atrial fibrillation with rate 90 bpm  Relevant CV Studies: Echocardiogram pending  Laboratory Data:  High Sensitivity Troponin:   Recent Labs  Lab 03/02/22 1022 03/02/22 1249  TROPONINIHS 5 6     Chemistry Recent Labs  Lab 03/02/22 1022  NA 139  K 4.4  CL 111  CO2 22  GLUCOSE 110*  BUN 12  CREATININE 0.82  CALCIUM 8.6*  MG 2.0  GFRNONAA >60  ANIONGAP 6    Recent Labs  Lab 03/02/22 1022  PROT 6.5  ALBUMIN 3.4*  AST 48*  ALT 75*  ALKPHOS 83  BILITOT 1.1   Lipids No results for input(s): "CHOL", "TRIG", "HDL", "LABVLDL", "LDLCALC", "CHOLHDL" in the last 168 hours.  Hematology Recent Labs  Lab 03/02/22 1022  WBC 9.0  RBC 4.44  HGB 13.1  HCT 40.1  MCV 90.3  MCH 29.5  MCHC 32.7  RDW 15.7*  PLT 262   Thyroid  Recent Labs  Lab 03/02/22 1249  TSH 1.359    BNP Recent Labs  Lab 03/02/22 1022  BNP 577.9*    DDimer No results for input(s): "DDIMER" in the last 168 hours.   Radiology/Studies:  DG Chest Portable 1 View  Result Date: 03/02/2022 CLINICAL DATA:  Short of breath. Symptoms concerning for atrial fibrillation beginning 5 days ago. EXAM: PORTABLE CHEST 1 VIEW COMPARISON:  02/07/2019. FINDINGS: Cardiac silhouette is mildly enlarged. No mediastinal or hilar masses. Mild opacity at the lateral right lung base, likely scarring or chronic atelectasis. Mild  chronic thickening of the minor fissure. These findings are stable from the most recent prior study. Remainder of the lungs is clear. No convincing pleural effusion and no pneumothorax. Skeletal structures are grossly intact. IMPRESSION: No active disease. Electronically Signed   By: Lajean Manes M.D.   On: 03/02/2022 10:39     Assessment and Plan:   Atrial fibrillation with RVR Presenting with poorly controlled rate 140 bpm Has been persistent over the past several weeks, worsening shortness of breath on exertion 10 pound weight gain consistent with acute on chronic  diastolic CHF Prelim echo showing severe TR, dilated IVC consistent with fluid overload -We will recommend we stop her amlodipine, change the diltiazem infusion to oral diltiazem.  We have initiated diltiazem 45 milligrams every 6 hours -Continue metoprolol tartrate 150 twice daily Continue Eliquis -If unable to tolerate atrial fibrillation with rate control as an outpatient, may need to initiate antiarrhythmics and  cardioversion given ablation is scheduled in February  2.  Acute on chronic diastolic CHF In the setting of atrial fibrillation with RVR Lasix 40 IV x1 now, then 20 IV twice daily tomorrow Significant tricuspid valve regurgitation and dilated IVC consistent with fluid overload -At the time of discharge will need Lasix 40 daily  3.  Essential hypertension We will discontinue amlodipine, initiate diltiazem as above Decrease irbesartan given titration of oral diltiazem as above    Total encounter time more than 80 minutes  Greater than 50% was spent in counseling and coordination of care with the patient   For questions or updates, please contact [place] Please consult [email or web address] for contact info under    Signed, Ida Rogue, MD  03/02/2022 3:00 PM

## 2022-03-02 NOTE — Progress Notes (Signed)
  Echocardiogram 2D Echocardiogram has been performed.  Regina Christensen 03/02/2022, 2:36 PM

## 2022-03-02 NOTE — Assessment & Plan Note (Addendum)
Patient noted to have transaminitis of unclear etiology and may be related to hepatic congestion from CHF We will obtain right upper quadrant ultrasound

## 2022-03-02 NOTE — Assessment & Plan Note (Signed)
Continue venlafaxine 

## 2022-03-02 NOTE — ED Notes (Signed)
Dr. Larinda Buttery, EDP at bedside for evaluation.

## 2022-03-02 NOTE — Assessment & Plan Note (Signed)
Patient has morbid obesity with complications of obstructive sleep apnea She is not on CPAP as she is unable to tolerate the mask

## 2022-03-02 NOTE — ED Triage Notes (Signed)
Pt states feeling like in Afib which started Wednesday, new medication change. Pt feels heart racing and SOB. Pt complains of slight headache and recent weight gain.

## 2022-03-02 NOTE — H&P (Addendum)
History and Physical    Patient: Regina Christensen:[id number] DOB: [birth year] DOA: 03/02/2022 DOS: the patient was seen and examined on 03/02/2022 PCP: [name], MD  Patient coming from: Home  Chief Complaint: Palpitations   HPI: Regina Christensen is a 65 y.o. female with medical history significant for paroxysmal atrial fibrillation, history of CVA, chronic diastolic dysfunction CHF, morbid obesity, sleep apnea who presents to the ER for evaluation of palpitations. Patient states that she has had symptoms of palpitations for a week and went to see her cardiologist who increased her metoprolol to 150 mg twice daily without any improvement in her symptoms.  She complains of fatigue, shortness of breath with minimal exertion, chest discomfort and has been unable to sleep due to her symptoms.  She also complains of increased swelling in her lower extremities. She denies having any fever, no chills, no cough, no abdominal pain, no changes in her bowel habits, no urinary symptoms, no nausea, no vomiting, no dizziness, no lightheadedness, no blurred vision, no focal deficit. Upon arrival to the ER she was noted to be in rapid A-fib and was started on a Cardizem drip. She will be admitted to the hospital for further evaluation.   Review of Systems: As mentioned in the history of present illness. All other systems reviewed and are negative. Past Medical History:  Diagnosis Date   Anxiety    Arthritis    Back pain    BPPV (benign paroxysmal positional vertigo)    Chronic diastolic HF (heart failure) (HCC)    Complication of anesthesia    Post-operative hypoxia requiring supplemental oxygen   Current use of long term anticoagulation    Apixaban   CVA (cerebral vascular accident) (HCC) 02/28/2020   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; imaging from [facility]   Depression    Edema of both lower extremities    GERD (gastroesophageal reflux disease)    Hypertension    IBS  (irritable bowel syndrome)    Lactose intolerance    Morbid obesity with BMI of 45.0-49.9, adult (HCC)    Obesity    Osteoarthritis    PAF (paroxysmal atrial fibrillation) (HCC)    Pre-diabetes    Sleep apnea    uses CPAP machine sometimes    Type 2 diabetes mellitus without complication (HCC)    Vitamin D deficiency    Past Surgical History:  Procedure Laterality Date   ABDOMINAL HYSTERECTOMY     BREAST EXCISIONAL BIOPSY Left    ENDOMETRIAL ABLATION     x2   ENDOSCOPIC CONCHA BULLOSA RESECTION Bilateral 08/08/2020   Procedure: ENDOSCOPIC CONCHA BULLOSA RESECTION;  Surgeon: Vernie Murders, MD;  Location: [facility];  Service: ENT;  Laterality: Bilateral;   ETHMOIDECTOMY Right 08/08/2020   Procedure: ETHMOIDECTOMY, LEFT PARTIAL ETHMOIDECTOMY;  Surgeon: Vernie Murders, MD;  Location: [facility];  Service: ENT;  Laterality: Right;   IMAGE GUIDED SINUS SURGERY N/A 08/08/2020   Procedure: IMAGE GUIDED SINUS SURGERY, RIGHT FRONTAL SINUSOTOMY;  Surgeon: Vernie Murders, MD;  Location: [facility];  Service: ENT;  Laterality: N/A;   LAPAROSCOPIC SLEEVE GASTRECTOMY     MAXILLARY ANTROSTOMY Bilateral 08/08/2020   Procedure: MAXILLARY ANTROSTOMY with tissue;  Surgeon: Vernie Murders, MD;  Location: [facility];  Service: ENT;  Laterality: Bilateral;   NASAL TURBINATE REDUCTION  08/27/2020   Procedure: TURBINATE REDUCTION /SUBMUCOSAL DEBRIDEMENT;  Surgeon: Vernie Murders, MD;  Location: [facility];  Service: ENT;;   TOTAL KNEE ARTHROPLASTY Right 06/2019   TOTAL KNEE ARTHROPLASTY Left  01/31/2021   [facility]   Social History:  reports that she has never smoked. She has never used smokeless tobacco. She reports that she does not drink alcohol and does not use drugs.  Allergies  Allergen Reactions   Hydrocodone-Acetaminophen Other (See Comments)    Extreme headache   Ace Inhibitors Cough   Hctz [Hydrochlorothiazide] Rash    face    Family History  Problem Relation Age of Onset    Diabetes Mother    High blood pressure Mother    Heart disease Father    Cancer Brother    Mental illness Neg Hx     Prior to Admission medications   Medication Sig Start Date End Date Taking? Authorizing Provider  amLODipine (NORVASC) 5 MG tablet Take 1 tablet (5 mg total) by mouth daily. 01/03/22  Yes Agbor-Etang, Arlys John, MD  apixaban (ELIQUIS) 5 MG TABS tablet Take 1 tablet (5 mg total) by mouth 2 (two) times daily. 01/07/22  Yes Agbor-Etang, Arlys John, MD  atorvastatin (LIPITOR) 10 MG tablet Take 1 tablet (10 mg total) by mouth daily. 01/22/22  Yes Sowles, Danna Hefty, MD  famotidine (PEPCID) 20 MG tablet Take 1 tablet (20 mg total) by mouth daily as needed for indigestion. 01/22/22  Yes Sowles, Danna Hefty, MD  furosemide (LASIX) 20 MG tablet Take 1 tablet (20 mg total) by mouth daily as needed. Take 20 MG (one tab) daily for 5 days. 02/26/22  Yes Hammock, Sheri, NP  gabapentin (NEURONTIN) 100 MG capsule Take 100 mg by mouth at bedtime. 12/20/21  Yes [provider]  metoprolol tartrate (LOPRESSOR) 100 MG tablet Take 1.5 tablets (150 mg total) by mouth 2 (two) times daily. 02/26/22  Yes Hammock, Lavonna Rua, NP  Multiple Vitamin (MULTIVITAMIN) tablet Take 1 tablet by mouth daily. BariatricPal Multivitamin   Yes [provider]  olmesartan (BENICAR) 40 MG tablet TAKE 1 TABLET BY MOUTH EVERY DAY 02/14/22  Yes Sowles, Danna Hefty, MD  potassium chloride (KLOR-CON M) 10 MEQ tablet Take 2 tablets (20 mEq total) by mouth daily. Take with Lasix 08/06/21  Yes Aguilar, Tracey, PA-C  Semaglutide, 2 MG/DOSE, (OZEMPIC, 2 MG/DOSE,) 8 MG/3ML SOPN Inject 2 mg into the skin once a week. 11/14/21  Yes Danford, Orpha Bur D, NP  triamcinolone (NASACORT) 55 MCG/ACT AERO nasal inhaler Place 2 sprays into the nose daily.   Yes [provider]  venlafaxine XR (EFFEXOR-XR) 37.5 MG 24 hr capsule Take 1 capsule (37.5 mg total) by mouth daily with breakfast. Take along with 75 mg daily 09/27/21  Yes Eappen, Levin Bacon, MD   venlafaxine XR (EFFEXOR-XR) 75 MG 24 hr capsule Take 1 capsule (75 mg total) by mouth daily with breakfast. Take along with 37.5 mg daily 09/27/21  Yes Eappen, Levin Bacon, MD  acetaminophen (TYLENOL) 650 MG CR tablet Take 1 tablet (650 mg total) by mouth every 8 (eight) hours as needed for pain. 08/20/20   Rodolph Bong, MD  benzonatate (TESSALON) 100 MG capsule Take 2 capsules (200 mg total) by mouth 2 (two) times daily as needed for cough. 02/12/22   Berniece Salines, FNP  hydrOXYzine (ATARAX) 25 MG tablet TAKE 0.5-1 TABLETS (12.5-25 MG TOTAL) BY MOUTH AT BEDTIME AS NEEDED. FOR SLEEP 10/21/21   Jomarie Longs, MD  meclizine (ANTIVERT) 12.5 MG tablet Take 1 tablet (12.5 mg total) by mouth 3 (three) times daily as needed for dizziness. 06/06/20   [name], MD  Vitamin D, Ergocalciferol, (DRISDOL) 1.25 MG (50000 UNIT) CAPS capsule Take 1 capsule (50,000 Units  total) by mouth every 7 (seven) days. 11/14/21   Danford, Orpha BurKaty D, NP  XIIDRA 5 % SOLN Place 1 drop into both eyes 2 (two) times daily. Patient not taking: Reported on 03/02/2022 02/03/17   [provider]    Physical Exam: Vitals:   03/02/22 1200 03/02/22 1215 03/02/22 1230 03/02/22 1245  BP:   (!) 123/93   Pulse: 77 83 70 74  Resp: (!) 22 10 (!) 22 (!) 21  Temp:      TempSrc:      SpO2: 98% 100% 98% 97%  Weight:      Height:       Physical Exam Vitals and nursing note reviewed.  Constitutional:      Appearance: She is obese.  HENT:     Head: Normocephalic and atraumatic.     Nose: Nose normal.     Mouth/Throat:     Mouth: Mucous membranes are moist.  Eyes:     Conjunctiva/sclera: Conjunctivae normal.  Cardiovascular:     Rate and Rhythm: Tachycardia present. Rhythm irregular.  Pulmonary:     Effort: Pulmonary effort is normal.     Breath sounds: Normal breath sounds.  Abdominal:     General: Abdomen is flat. Bowel sounds are normal.     Palpations: Abdomen is soft.  Musculoskeletal:     Cervical back: Normal  range of motion and neck supple.     Right lower leg: Edema present.     Left lower leg: Edema present.  Skin:    General: Skin is warm and dry.  Neurological:     General: No focal deficit present.     Mental Status: She is alert and oriented to person, place, and time.  Psychiatric:        Mood and Affect: Mood normal.        Behavior: Behavior normal.     Data Reviewed: Relevant notes from primary care and specialist visits, past discharge summaries as available in EHR, including Care Everywhere. Prior diagnostic testing as pertinent to current admission diagnoses Updated medications and problem lists for reconciliation ED course, including vitals, labs, imaging, treatment and response to treatment Triage notes, nursing and pharmacy notes and ED provider's notes Notable results as noted in HPI Labs reviewed.  White count 9.0, hemoglobin 13.1, hematocrit 40.1, platelet count 262, BNP 577, sodium 139, potassium 4.4, chloride 111, bicarb 22, glucose 110, BUN 12, creatinine 0.82, calcium 8.6, total protein 6.5, albumin 3.4, AST 48, ALT 75, alkaline phosphatase 83, total bilirubin 1.1, troponin 5, magnesium 2.0 Chest x-ray reviewed by me shows no evidence of acute cardiopulmonary disease Twelve-lead EKG reviewed by me shows A-fib with a rapid ventricular rate There are no new results to review at this time.  Assessment and Plan: * Atrial fibrillation with rapid ventricular response (HCC) Patient has a known history of paroxysmal A-fib and presents to the ER for evaluation of palpitations, exertional shortness of breath and chest pain. She was seen in the outpatient setting and her metoprolol was increased to 150 mg twice daily without any improvement in her symptoms. Patient has been referred to EP for evaluation for catheter ablation as a long-term rhythm control strategy Continue Cardizem drip initiated in the ER Continue metoprolol Continue Eliquis as primary prophylaxis for an  acute stroke Cardiology consult  Transaminitis Patient noted to have transaminitis of unclear etiology and may be related to hepatic congestion from CHF We will obtain right upper quadrant ultrasound  Acute on chronic diastolic CHF (  congestive heart failure) (HCC) Most likely rate related Last 2D echocardiogram was from 2021 and showed an LVEF of 60 to 65%.  Continue IV furosemide, metoprolol and Avapro Repeat 2D echocardiogram  Depression Continue venlafaxine  OSA (obstructive sleep apnea) Patient has morbid obesity with complications of obstructive sleep apnea She is not on CPAP as she is unable to tolerate the mask  GAD (generalized anxiety disorder) Continue venlafaxine  Morbid obesity (HCC) BMI 40.29 Complicates overall prognosis and care Lifestyle modification and exercise has been discussed with patient in detail      Advance Care Planning:   Code Status: Full Code   Consults: Cardiology  Family Communication: Greater than 50% of time was spent discussing patient's condition and plan of care with her at the bedside.  All questions and concerns have been addressed.  She verbalizes understanding and agrees to the plan.  Severity of Illness: The appropriate patient status for this patient is INPATIENT. Inpatient status is judged to be reasonable and necessary in order to provide the required intensity of service to ensure the patient's safety. The patient's presenting symptoms, physical exam findings, and initial radiographic and laboratory data in the context of their chronic comorbidities is felt to place them at high risk for further clinical deterioration. Furthermore, it is not anticipated that the patient will be medically stable for discharge from the hospital within 2 midnights of admission.   * I certify that at the point of admission it is my clinical judgment that the patient will require inpatient hospital care spanning beyond 2 midnights from the point of  admission due to high intensity of service, high risk for further deterioration and high frequency of surveillance required.*  Author: Lucile Shutters, MD 03/02/2022 3:11 PM  For on call review [email or web address].

## 2022-03-02 NOTE — ED Notes (Signed)
Report given to Elana, RN.

## 2022-03-03 ENCOUNTER — Inpatient Hospital Stay: Payer: Medicare HMO

## 2022-03-03 DIAGNOSIS — I5020 Unspecified systolic (congestive) heart failure: Secondary | ICD-10-CM | POA: Diagnosis not present

## 2022-03-03 DIAGNOSIS — I10 Essential (primary) hypertension: Secondary | ICD-10-CM

## 2022-03-03 DIAGNOSIS — I4891 Unspecified atrial fibrillation: Secondary | ICD-10-CM | POA: Diagnosis not present

## 2022-03-03 LAB — CBC
HCT: 39.5 % (ref 36.0–46.0)
Hemoglobin: 13 g/dL (ref 12.0–15.0)
MCH: 29.5 pg (ref 26.0–34.0)
MCHC: 32.9 g/dL (ref 30.0–36.0)
MCV: 89.6 fL (ref 80.0–100.0)
Platelets: 252 10*3/uL (ref 150–400)
RBC: 4.41 MIL/uL (ref 3.87–5.11)
RDW: 15.7 % — ABNORMAL HIGH (ref 11.5–15.5)
WBC: 9.1 10*3/uL (ref 4.0–10.5)
nRBC: 0 % (ref 0.0–0.2)

## 2022-03-03 LAB — BASIC METABOLIC PANEL
Anion gap: 8 (ref 5–15)
BUN: 19 mg/dL (ref 8–23)
CO2: 24 mmol/L (ref 22–32)
Calcium: 9 mg/dL (ref 8.9–10.3)
Chloride: 109 mmol/L (ref 98–111)
Creatinine, Ser: 0.95 mg/dL (ref 0.44–1.00)
GFR, Estimated: >60 mL/min (ref >60)
Glucose, Bld: 153 mg/dL — ABNORMAL HIGH (ref 70–99)
Potassium: 3.3 mmol/L — ABNORMAL LOW (ref 3.5–5.1)
Sodium: 141 mmol/L (ref 135–145)

## 2022-03-03 MED ORDER — IRBESARTAN 150 MG PO TABS
75.0000 mg | ORAL_TABLET | Freq: Every day | ORAL | Status: DC
  Administered 2022-03-04 – 2022-03-06 (×3): 75 mg via ORAL
  Filled 2022-03-03 (×3): qty 1

## 2022-03-03 MED ORDER — POTASSIUM CHLORIDE CRYS ER 20 MEQ PO TBCR
40.0000 meq | EXTENDED_RELEASE_TABLET | Freq: Once | ORAL | Status: AC
  Administered 2022-03-03: 40 meq via ORAL
  Filled 2022-03-03: qty 2

## 2022-03-03 NOTE — Progress Notes (Addendum)
PROGRESS NOTE  Regina QuaJosephine S Christensen    DOB: 05/17/1955, 66 y.o.  WUJ:[id number]    Code Status: Full Code   DOA: 03/02/2022   LOS: 1   Brief hospital course  Regina Christensen is a 66 y.o. female with a PMH significant for paroxysmal atrial fibrillation, history of CVA, chronic diastolic dysfunction CHF, morbid obesity, sleep apnea.  They presented from home to the ED on 03/02/2022 with palpitations and DOE intermittently over the past couple weeks. She has been actively seeing her cardiologist to optimize management of Afib. She had recent increase of her metoprolol to 150mg  BID. She had increased swelling, cough, DOE and orthopnea so presented to the ED.    In the ED, it was found that they were in Afib RVR with heart rate 130.  They were initially treated with metoprolol, Cardizem gtt and cardiology was consulted.  Other significant findings included normal TSH, troponin negative, unremarkable metabolic panel and CBC.  Patient was admitted to medicine service for further workup and management of Afib and other comorbidities as outlined in detail below.  03/03/22 -stable  Assessment & Plan  Principal Problem:   Atrial fibrillation with RVR (HCC) Active Problems:   Transaminitis   Morbid obesity (HCC)   GAD (generalized anxiety disorder)   OSA (obstructive sleep apnea)   Depression   Acute on chronic combined systolic and diastolic CHF (congestive heart failure) (HCC)   Dilated cardiomyopathy (HCC)   Nonrheumatic tricuspid valve regurgitation  Afib RVR- now rate controlled. echo showing EF 40-45%, mod-sever TVR - cardiology consulted, appreciate your care - cardizem gtt>> PO - continue eliquis, metoprolol   Transaminitis- RUQ negative   HFrEF  HTN  mod-severe TVR- Last 2D echocardiogram was from 2021 and showed an LVEF of 60 to 65%. Now 40-45% - continue home meds - cardiology following, appreciate recs  Hypokalemia- k+ goal 4.0 - monitor and replete PRN  Type 2 DM-  non insulin dependent. Blood sugars well controlled inpatient - hold home semaglutide   Depression Continue venlafaxine   OSA (obstructive sleep apnea) Patient has morbid obesity with complications of obstructive sleep apnea She is not on CPAP as she is unable to tolerate the mask   GAD (generalized anxiety disorder) Continue venlafaxine   Morbid obesity (HCC) BMI 40.29 Complicates overall prognosis and care Lifestyle modification and exercise has been discussed with patient in detail  Body mass index is 40.83 kg/m.  VTE ppx:  apixaban (ELIQUIS) tablet 5 mg   Diet:     Diet   Diet heart healthy/carb modified Room service appropriate? Yes; Fluid consistency: Thin   Consultants: cardiology  Subjective 03/03/22    Pt reports no palpitations, SOB at rest this am. Questions and concerns addressed at time of encounter.   Objective   Vitals:   03/03/22 0004 03/03/22 0437 03/03/22 0438 03/03/22 0440  BP: 133/87 124/79    Pulse: 76 97 75   Resp: 18 18    Temp: 98.5 F (36.9 C) 97.8 F (36.6 C)    TempSrc:      SpO2: 96% 95% 97%   Weight:    121.8 kg  Height:        Intake/Output Summary (Last 24 hours) at 03/03/2022 0846 Last data filed at 03/03/2022 0600 Gross per 24 hour  Intake 360 ml  Output --  Net 360 ml   Filed Weights   03/02/22 0959 03/03/22 0440  Weight: 120.2 kg 121.8 kg     Physical Exam:  General:  awake, alert, NAD HEENT: atraumatic, clear conjunctiva, anicteric sclera, MMM, hearing grossly normal Respiratory: normal respiratory effort. Cardiovascular: quick capillary refill, normal S1/S2, RRR, no JVD, murmurs Nervous: A&O x3. no gross focal neurologic deficits, normal speech Extremities: moves all equally, no edema, normal tone Skin: dry, intact, normal temperature, normal color. No rashes, lesions or ulcers on exposed skin Psychiatry: normal mood, congruent affect  Labs   I have personally reviewed the following labs and imaging  studies CBC    Component Value Date/Time   WBC 9.1 03/03/2022 0500   RBC 4.41 03/03/2022 0500   HGB 13.0 03/03/2022 0500   HGB 12.9 11/14/2020 0910   HCT 39.5 03/03/2022 0500   HCT 40.9 11/14/2020 0910   PLT 252 03/03/2022 0500   PLT 342 11/14/2020 0910   MCV 89.6 03/03/2022 0500   MCV 90 11/14/2020 0910   MCV 89 07/10/2012 2133   MCH 29.5 03/03/2022 0500   MCHC 32.9 03/03/2022 0500   RDW 15.7 (H) 03/03/2022 0500   RDW 13.9 11/14/2020 0910   RDW 15.0 (H) 07/10/2012 2133   LYMPHSABS 1.3 03/02/2022 1022   LYMPHSABS 1.5 11/14/2020 0910   MONOABS 0.5 03/02/2022 1022   EOSABS 0.1 03/02/2022 1022   EOSABS 0.1 11/14/2020 0910   BASOSABS 0.0 03/02/2022 1022   BASOSABS 0.1 11/14/2020 0910      Latest Ref Rng & Units 03/03/2022    5:00 AM 03/02/2022   10:22 AM 02/10/2022   12:09 PM  BMP  Glucose 70 - 99 mg/dL 185  631  72   BUN 8 - 23 mg/dL 19  12  11    Creatinine 0.44 - 1.00 mg/dL  4.97  0.26   BUN/Creat Ratio 6 - 22 (calc)   SEE NOTE:   Sodium 135 - 145 mmol/L 141  139  142   Potassium 3.5 - 5.1 mmol/L 3.3  4.4  4.3   Chloride 98 - 111 mmol/L 109  111  106   CO2 22 - 32 mmol/L 24  22  29    Calcium 8.9 - 10.3 mg/dL 9.0  8.6  9.8     ECHOCARDIOGRAM COMPLETE  Result Date: 03/02/2022    ECHOCARDIOGRAM REPORT   Patient Name:   Regina Christensen Date of Exam: 03/02/2022 Medical Rec #:  Regina Qua          Height:       68.0 in Accession #:    13/03/2022         Weight:       265.0 lb Date of Birth:  10/27/1955           BSA:          2.303 m Patient Age:    66 years           BP:           123/93 mmHg Patient Gender: F                  HR:           81 bpm. Exam Location:  [facility] Procedure: 2D Echo Indications:     CHF I50.31  History:         Patient has prior history of Echocardiogram examinations, most                  recent 03/01/2020.  Sonographer:     08/22/1955 RDCS Referring Phys:  13/02/2020 AGBATA Diagnosing Phys: Overton Mam MD IMPRESSIONS  1.  Left  ventricular ejection fraction, by estimation, is 40 to 45%. The left ventricle has mildly decreased function. The left ventricle demonstrates global hypokinesis. There is mild left ventricular hypertrophy. Left ventricular diastolic parameters are indeterminate.  2. Right ventricular systolic function is moderately reduced. The right ventricular size is mildly enlarged. There is mildly elevated pulmonary artery systolic pressure. The estimated right ventricular systolic pressure is 41.6 mmHg.  3. Left atrial size was moderately dilated.  4. The mitral valve is normal in structure. Mild to moderate mitral valve regurgitation. No evidence of mitral stenosis.  5. Tricuspid valve regurgitation is moderate to severe.  6. The aortic valve is normal in structure. Aortic valve regurgitation is not visualized. Aortic valve sclerosis/calcification is present, without any evidence of aortic stenosis.  7. The inferior vena cava is dilated in size with <50% respiratory variability, suggesting right atrial pressure of 15 mmHg.  8. Rhythm is atrial fibrillation. FINDINGS  Left Ventricle: Left ventricular ejection fraction, by estimation, is 40 to 45%. The left ventricle has mildly decreased function. The left ventricle demonstrates global hypokinesis. The left ventricular internal cavity size was normal in size. There is  mild left ventricular hypertrophy. Left ventricular diastolic parameters are indeterminate. Right Ventricle: The right ventricular size is mildly enlarged. No increase in right ventricular wall thickness. Right ventricular systolic function is moderately reduced. There is mildly elevated pulmonary artery systolic pressure. The tricuspid regurgitant velocity is 2.58 m/s, and with an assumed right atrial pressure of 15 mmHg, the estimated right ventricular systolic pressure is 41.6 mmHg. Left Atrium: Left atrial size was moderately dilated. Right Atrium: Right atrial size was normal in size. Pericardium: There is  no evidence of pericardial effusion. Mitral Valve: The mitral valve is normal in structure. Mild to moderate mitral valve regurgitation. No evidence of mitral valve stenosis. Tricuspid Valve: The tricuspid valve is normal in structure. Tricuspid valve regurgitation is moderate to severe. No evidence of tricuspid stenosis. Aortic Valve: The aortic valve is normal in structure. Aortic valve regurgitation is not visualized. Aortic valve sclerosis/calcification is present, without any evidence of aortic stenosis. Aortic valve peak gradient measures 4.8 mmHg. Pulmonic Valve: The pulmonic valve was normal in structure. Pulmonic valve regurgitation is not visualized. No evidence of pulmonic stenosis. Aorta: The aortic root is normal in size and structure. Venous: The inferior vena cava is dilated in size with less than 50% respiratory variability, suggesting right atrial pressure of 15 mmHg. IAS/Shunts: No atrial level shunt detected by color flow Doppler.  LEFT VENTRICLE PLAX 2D LVIDd:         4.60 cm     Diastology LVIDs:         3.40 cm     LV e' medial:    9.03 cm/s LV PW:         1.30 cm     LV E/e' medial:  12.0 LV IVS:        1.20 cm     LV e' lateral:   14.70 cm/s LVOT diam:     1.90 cm     LV E/e' lateral: 7.3 LV SV:         36 LV SV Index:   16 LVOT Area:     2.84 cm  LV Volumes (MOD) LV vol d, MOD A4C: 61.9 ml LV vol s, MOD A4C: 29.0 ml LV SV MOD A4C:     61.9 ml RIGHT VENTRICLE RV Basal diam:  4.20 cm RV S prime:  10.00 cm/s TAPSE (M-mode): 1.5 cm LEFT ATRIUM             Index        RIGHT ATRIUM           Index LA diam:        5.20 cm 2.26 cm/m   RA Area:     15.60 cm LA Vol (A2C):   62.9 ml 27.31 ml/m  RA Volume:   38.60 ml  16.76 ml/m LA Vol (A4C):   91.7 ml 39.81 ml/m LA Biplane Vol: 80.9 ml 35.12 ml/m  AORTIC VALVE                 PULMONIC VALVE AV Area (Vmax): 2.08 cm     PV Vmax:       0.88 m/s AV Vmax:        109.00 cm/s  PV Peak grad:  3.1 mmHg AV Peak Grad:   4.8 mmHg LVOT Vmax:      79.80  cm/s LVOT Vmean:     51.700 cm/s LVOT VTI:       0.128 m  AORTA Ao Root diam: 3.20 cm MITRAL VALVE                TRICUSPID VALVE MV Area (PHT): 4.93 cm     TR Peak grad:   26.6 mmHg MV Decel Time: 154 msec     TR Vmax:        258.00 cm/s MV E velocity: 108.00 cm/s                             SHUNTS                             Systemic VTI:  0.13 m                             Systemic Diam: 1.90 cm Julien Nordmann MD Electronically signed by Julien Nordmann MD Signature Date/Time: 03/02/2022/3:53:22 PM    Final    DG Chest Portable 1 View  Result Date: 03/02/2022 CLINICAL DATA:  Short of breath. Symptoms concerning for atrial fibrillation beginning 5 days ago. EXAM: PORTABLE CHEST 1 VIEW COMPARISON:  02/07/2019. FINDINGS: Cardiac silhouette is mildly enlarged. No mediastinal or hilar masses. Mild opacity at the lateral right lung base, likely scarring or chronic atelectasis. Mild chronic thickening of the minor fissure. These findings are stable from the most recent prior study. Remainder of the lungs is clear. No convincing pleural effusion and no pneumothorax. Skeletal structures are grossly intact. IMPRESSION: No active disease. Electronically Signed   By: Amie Portland M.D.   On: 03/02/2022 10:39    Disposition Plan & Communication  Patient status: Inpatient  Admitted From: Home Planned disposition location: Home Anticipated discharge date: 11/15 pending cardiology clearance  Family Communication: none    Author: Leeroy Bock, DO [facility] 03/03/2022, 8:46 AM   Available by Epic secure chat 7AM-7PM. If 7PM-7AM, please contact night-coverage.  [facility] contact information found on [email or web address].

## 2022-03-03 NOTE — Progress Notes (Signed)
Rounding Note    Patient Name: Regina Christensen Date of Encounter: 03/03/2022  [facility] Cardiologist: Debbe Odea, MD   Subjective   Patient seen on AM rounds. Denies any chest pain or shortness of breath. No outputs recorded. Heart rates have been better controlled since 11:30 am 03/02/2022. Potassium 3.3 this am. Continued on diltiazem drip at 7.5mg /hr.  Inpatient Medications    Scheduled Meds:  apixaban  5 mg Oral BID   atorvastatin  10 mg Oral Daily   diltiazem  45 mg Oral Q6H   furosemide  20 mg Intravenous BID   gabapentin  100 mg Oral QHS   irbesartan  150 mg Oral Daily   metoprolol tartrate  150 mg Oral BID   multivitamin with minerals  1 tablet Oral Daily   potassium chloride  40 mEq Oral Once   venlafaxine XR  37.5 mg Oral Q breakfast   venlafaxine XR  75 mg Oral Q breakfast   Vitamin D (Ergocalciferol)  50,000 Units Oral Q7 days   Continuous Infusions:  diltiazem (CARDIZEM) infusion 7.5 mg/hr (03/03/22 0058)   PRN Meds: acetaminophen, famotidine, hydrOXYzine, ondansetron **OR** ondansetron (ZOFRAN) IV   Vital Signs    Vitals:   03/03/22 0004 03/03/22 0437 03/03/22 0438 03/03/22 0440  BP: 133/87 124/79    Pulse: 76 97 75   Resp: 18 18    Temp: 98.5 F (36.9 C) 97.8 F (36.6 C)    TempSrc:      SpO2: 96% 95% 97%   Weight:    121.8 kg  Height:        Intake/Output Summary (Last 24 hours) at 03/03/2022 0922 Last data filed at 03/03/2022 0600 Gross per 24 hour  Intake 360 ml  Output --  Net 360 ml      03/03/2022    4:40 AM 03/02/2022    9:59 AM 02/26/2022    3:37 PM  Last 3 Weights  Weight (lbs) 268 lb 8.3 oz 265 lb 265 lb 6 oz  Weight (kg) 121.8 kg 120.203 kg 120.373 kg      Telemetry    Rate controlled atrial fibrillation rates 80-90 - Personally Reviewed  ECG    No new tracings completed - Personally Reviewed  Physical Exam   GEN: No acute distress.   Neck: No JVD Cardiac: irregularly irregular, no  murmurs, rubs, or gallops.  Respiratory: Clear to auscultation bilaterally. Respirations are unlabored on room air GI: Soft, nontender, non-distended  MS: trace edema; No deformity. Neuro:  Nonfocal  Psych: Normal affect   Labs    High Sensitivity Troponin:   Recent Labs  Lab 03/02/22 1022 03/02/22 1249  TROPONINIHS 5 6     Chemistry Recent Labs  Lab 03/02/22 1022 03/03/22 0500  NA 139 141  K 4.4 3.3*  CL 111 109  CO2 22 24  GLUCOSE 110* 153*  BUN 12 19  CREATININE 0.82 0.95  CALCIUM 8.6* 9.0  MG 2.0  --   PROT 6.5  --   ALBUMIN 3.4*  --   AST 48*  --   ALT 75*  --   ALKPHOS 83  --   BILITOT 1.1  --   GFRNONAA >60 >60  ANIONGAP 6 8    Lipids No results for input(s): "CHOL", "TRIG", "HDL", "LABVLDL", "LDLCALC", "CHOLHDL" in the last 168 hours.  Hematology Recent Labs  Lab 03/02/22 1022 03/03/22 0500  WBC 9.0 9.1  RBC 4.44 4.41  HGB 13.1 13.0  HCT 40.1  39.5  MCV 90.3 89.6  MCH 29.5 29.5  MCHC 32.7 32.9  RDW 15.7* 15.7*  PLT 262 252   Thyroid  Recent Labs  Lab 03/02/22 1249  TSH 1.359    BNP Recent Labs  Lab 03/02/22 1022  BNP 577.9*    DDimer No results for input(s): "DDIMER" in the last 168 hours.   Radiology      Cardiac Studies  TTE 03/02/2022 1. Left ventricular ejection fraction, by estimation, is 40 to 45%. The  left ventricle has mildly decreased function. The left ventricle  demonstrates global hypokinesis. There is mild left ventricular  hypertrophy. Left ventricular diastolic parameters  are indeterminate.   2. Right ventricular systolic function is moderately reduced. The right  ventricular size is mildly enlarged. There is mildly elevated pulmonary  artery systolic pressure. The estimated right ventricular systolic  pressure is 41.6 mmHg.   3. Left atrial size was moderately dilated.   4. The mitral valve is normal in structure. Mild to moderate mitral valve  regurgitation. No evidence of mitral stenosis.   5. Tricuspid  valve regurgitation is moderate to severe.   6. The aortic valve is normal in structure. Aortic valve regurgitation is  not visualized. Aortic valve sclerosis/calcification is present, without  any evidence of aortic stenosis.   7. The inferior vena cava is dilated in size with <50% respiratory  variability, suggesting right atrial pressure of 15 mmHg.   8. Rhythm is atrial fibrillation.    Patient Profile     66 y.o. female with a history of paroxysmal atrial fibrillation, hypertension, morbid obesity, stroke (02/2020), sleep apnea not on CPAP, who is being seen and evaluated for atrial fibrillation RVR and shortness of breath.   Assessment & Plan    Atrial fibrillation RVR -remains in atrial fibrillation with rates 80-90 -continued on diltiazem drip at 7.5 mg/hr, start trying to wean drip -started on oral diltiazem dosing yesterday afternoon 45 mg every 6 hours -continued on metoprolol 150 mg bid -continued on apixaban 5 mg bid -CHA2DS2-VASc of at least 6 -is scheduled for atrial fibrillation ablation as an outpatient in February with Dr Lalla Brothers, if unable to tolerate and maintain rate control will likely need antiarrhythmics and cardioversion  Acute on chronic diastolic CHF -remains on room air -appears euvolemic on exam -continued on lasix 20 mg IV BID -daily bmp -daily weight, I&O, low sodium diet -LVEF 40-45%, moderate to severe TR, dilated IVC consistent with volume overload -recommend increasing PTA lasix to 40 mg daily on discharge  Hypertension -blood pressure 124/79 -continued on irbesartan, metoprolol, diltiazem, and furosemide -vitals per unit protocol  Hypokalemia -potassium 3.3 -daily bmp while on diuretic therapy -ordered of KCL today -monitor/trend/replete electrolytes as needed     CHA2DS2-VASc Score = 6   This indicates a 9.7% annual risk of stroke. The patient's score is based upon: CHF History: 1 HTN History: 1 Diabetes History: 0 Stroke  History: 2 Vascular Disease History: 0 Age Score: 1 Gender Score: 1     For questions or updates, please contact [facility] Please consult [email or web address] for contact info under        Signed, Adilynn Bessey, NP  03/03/2022, 9:22 AM

## 2022-03-03 NOTE — Discharge Instructions (Signed)

## 2022-03-03 NOTE — Consult Note (Signed)
   Heart Failure Nurse Navigator Note  HFmrEF 40 to 45%.  Mild LVH.  Indeterminate diastolic dysfunction.  Right ventricular systolic function is moderately reduced.  Mildly elevated pulmonary artery systolic pressures.  Mild to moderate mitral regurgitation.  Moderate to severe tricuspid regurgitation.  She presented to the emergency room with complaints of palpitations for a week, noted fatigue, shortness of breath with minimal exertion and chest discomfort, lower extremity edema and being unable to sleep due to her symptoms.  She had also noted a 10 pound weight gain.  BNP 577.  Comorbidities:  Paroxysmal atrial fibrillation on NOAC History of CVA Morbid obesity Sleep apnea Anxiety Arthritis Diabetes Hypertension  Medications:  Apixaban 5 mg 2 times a day Atorvastatin 10 mg daily Diltiazem infusion Diltiazem 45 mg every 6 hours Furosemide 20 mg 2 times a day Irbesartan 150 mg daily Metoprolol tartrate 150 mg 2 times a day   Labs:  Sodium 141, potassium 3.3, chloride 109, CO2 24, BUN 19, creatinine 0.95, estimated GFR greater than 60, hemoglobin 13, hematocrit 39.5. Weight is 121.8 kg Intake 360 mL Output not documented Blood pressure 100/52   Initial meeting with patient, she was lying in bed in no acute distress having just finished breakfast.  She remains on Cardizem infusion.  Discussed heart failure and what it means.  Patient states that she does not weigh herself on a daily basis.  Went over the importance of weighing daily and reporting 2 pound weight gain overnight or 5 pounds within the week.  Also discussed reporting changes in symptoms such as what brought her into the hospital as increasing shortness of breath, PND orthopnea, lower extremity edema and dry cough.  She states at home that she has not used salt at the table for a long time nor do her family members add it to the meals.  She states that they use [employer].  They do eat in a restaurant at least  once a week, she lists pizza and going to [employer].  Discussed making wise choices when going to the restaurant.  Also discussed fluid restriction and what is considered a liquid.  She did not feel that she went over 64 ounces in a days time.  She is compliant with her medications.  Discussed follow-up in the outpatient heart failure clinic for which she has an appointment on November 28 at 8:30 in the morning.  She has a 0% no-show ratio.  She was given handout about ventricle health, and the program was explained.  States that she does have a smart phone and does text, email and video chat.  I asked that she discuss it with her family and I would check with her tomorrow to see if she was interested in enrolling.  Tresa Endo RN [facility]

## 2022-03-03 NOTE — Progress Notes (Signed)
Nutrition Brief Note  RD pulled to chart secondary to CHF.   Wt Readings from Last 15 Encounters:  03/03/22 121.8 kg  02/26/22 120.4 kg  02/19/22 115.7 kg  02/10/22 114.8 kg  02/05/22 114.3 kg  12/30/21 117.9 kg  12/18/21 116.1 kg  11/28/21 116.8 kg  11/14/21 114.3 kg  10/18/21 115.2 kg  09/25/21 113.4 kg  09/24/21 115 kg  09/10/21 115.2 kg  08/28/21 113.9 kg  08/06/21 113.4 kg   Pt with medical history significant for paroxysmal atrial fibrillation, history of CVA, chronic diastolic dysfunction CHF, morbid obesity, sleep apnea who presents to the ER for evaluation of palpitations.   Pt admitted with a-fib.   RD provided "Low Sodium Nutrition Therapy" handout from [facility] Nutrition Care Manual; attached to AVS/ discharge summary.   Body mass index is 40.83 kg/m. Patient meets criteria for obesity, class III based on current BMI. Obesity is a complex, chronic medical condition that is optimally managed by a multidisciplinary care team. Weight loss is not an ideal goal for an acute inpatient hospitalization. However, if further work-up for obesity is warranted, consider outpatient referral to outpatient bariatric service and/or [facility].    Current diet order is heart healthy, carb modified (liberalized to 2 gram sodium), patient is consuming approximately n/a% of meals at this time. Labs and medications reviewed.   No nutrition interventions warranted at this time. If nutrition issues arise, please consult RD.   Levada Schilling, RD, LDN, CDCES Registered Dietitian II Certified Diabetes Care and Education Specialist Please refer to [facility] for RD and/or RD on-call/weekend/after hours pager

## 2022-03-04 ENCOUNTER — Other Ambulatory Visit: Payer: Self-pay | Admitting: Family Medicine

## 2022-03-04 DIAGNOSIS — E669 Obesity, unspecified: Secondary | ICD-10-CM

## 2022-03-04 DIAGNOSIS — I5020 Unspecified systolic (congestive) heart failure: Secondary | ICD-10-CM | POA: Diagnosis not present

## 2022-03-04 DIAGNOSIS — K219 Gastro-esophageal reflux disease without esophagitis: Secondary | ICD-10-CM

## 2022-03-04 DIAGNOSIS — I4891 Unspecified atrial fibrillation: Secondary | ICD-10-CM | POA: Diagnosis not present

## 2022-03-04 DIAGNOSIS — I5032 Chronic diastolic (congestive) heart failure: Secondary | ICD-10-CM

## 2022-03-04 DIAGNOSIS — I480 Paroxysmal atrial fibrillation: Secondary | ICD-10-CM

## 2022-03-04 DIAGNOSIS — Z6836 Body mass index (BMI) 36.0-36.9, adult: Secondary | ICD-10-CM

## 2022-03-04 LAB — BASIC METABOLIC PANEL
Anion gap: 9 (ref 5–15)
BUN: 19 mg/dL (ref 8–23)
CO2: 22 mmol/L (ref 22–32)
Calcium: 8.8 mg/dL — ABNORMAL LOW (ref 8.9–10.3)
Chloride: 107 mmol/L (ref 98–111)
Creatinine, Ser: 0.74 mg/dL (ref 0.44–1.00)
GFR, Estimated: >60 mL/min (ref >60)
Glucose, Bld: 126 mg/dL — ABNORMAL HIGH (ref 70–99)
Potassium: 3.6 mmol/L (ref 3.5–5.1)
Sodium: 138 mmol/L (ref 135–145)

## 2022-03-04 LAB — CBC
HCT: 38.5 % (ref 36.0–46.0)
Hemoglobin: 12.7 g/dL (ref 12.0–15.0)
MCH: 29.4 pg (ref 26.0–34.0)
MCHC: 33 g/dL (ref 30.0–36.0)
MCV: 89.1 fL (ref 80.0–100.0)
Platelets: 260 10*3/uL (ref 150–400)
RBC: 4.32 MIL/uL (ref 3.87–5.11)
RDW: 15.7 % — ABNORMAL HIGH (ref 11.5–15.5)
WBC: 8.5 10*3/uL (ref 4.0–10.5)
nRBC: 0 % (ref 0.0–0.2)

## 2022-03-04 LAB — PROTIME-INR
INR: 1.4 — ABNORMAL HIGH (ref 0.8–1.2)
Prothrombin Time: 16.6 seconds — ABNORMAL HIGH (ref 11.4–15.2)

## 2022-03-04 LAB — HIV ANTIBODY (ROUTINE TESTING W REFLEX): HIV Screen 4th Generation wRfx: NONREACTIVE

## 2022-03-04 MED ORDER — FUROSEMIDE 10 MG/ML IJ SOLN
40.0000 mg | Freq: Two times a day (BID) | INTRAMUSCULAR | Status: AC
  Administered 2022-03-04 – 2022-03-05 (×3): 40 mg via INTRAVENOUS
  Filled 2022-03-04 (×3): qty 4

## 2022-03-04 MED ORDER — POTASSIUM CHLORIDE CRYS ER 20 MEQ PO TBCR
40.0000 meq | EXTENDED_RELEASE_TABLET | ORAL | Status: AC
  Administered 2022-03-04 (×2): 40 meq via ORAL
  Filled 2022-03-04 (×2): qty 2

## 2022-03-04 MED ORDER — SODIUM CHLORIDE 0.9 % IV SOLN: INTRAVENOUS | Status: DC

## 2022-03-04 NOTE — Progress Notes (Signed)
Rounding Note    Patient Name: Regina Christensen Date of Encounter: 03/04/2022  [facility] Cardiologist: Debbe Odea, MD   Subjective   Patient seen on AM rounds. Denies any chest pain or worsening shortness of breath. Remains in atrial fibrillation on the telemetry monitor. Diltiazem drip was weaned to off last evening.   Inpatient Medications    Scheduled Meds:  apixaban  5 mg Oral BID   atorvastatin  10 mg Oral Daily   diltiazem  45 mg Oral Q6H   furosemide  20 mg Intravenous BID   gabapentin  100 mg Oral QHS   irbesartan  75 mg Oral Daily   metoprolol tartrate  150 mg Oral BID   multivitamin with minerals  1 tablet Oral Daily   venlafaxine XR  37.5 mg Oral Q breakfast   venlafaxine XR  75 mg Oral Q breakfast   Vitamin D (Ergocalciferol)  50,000 Units Oral Q7 days   Continuous Infusions:  diltiazem (CARDIZEM) infusion 5 mg/hr (03/03/22 1715)   PRN Meds: acetaminophen, famotidine, hydrOXYzine, ondansetron **OR** ondansetron (ZOFRAN) IV   Vital Signs    Vitals:   03/04/22 0320 03/04/22 0545 03/04/22 0807 03/04/22 1136  BP: 108/63 130/84 118/61 117/81  Pulse: 74  73 73  Resp: 16  19 18   Temp: 97.6 F (36.4 C)  98 F (36.7 C) 98.1 F (36.7 C)  TempSrc: Oral  Oral   SpO2: 99%  99% 98%  Weight:      Height:        Intake/Output Summary (Last 24 hours) at 03/04/2022 1206 Last data filed at 03/04/2022 1054 Gross per 24 hour  Intake 1307.05 ml  Output 1400 ml  Net -92.95 ml      03/03/2022    4:40 AM 03/02/2022    9:59 AM 02/26/2022    3:37 PM  Last 3 Weights  Weight (lbs) 268 lb 8.3 oz 265 lb 265 lb 6 oz  Weight (kg) 121.8 kg 120.203 kg 120.373 kg      Telemetry    Rate controlled atrial fibrillation rates 70-80 - Personally Reviewed  ECG    No new tracings - Personally Reviewed  Physical Exam   GEN: No acute distress.   Neck: No JVD Cardiac: irregularly irregular, no murmurs, rubs, or gallops.  Respiratory: Clear to  auscultation bilaterally. Respirations are unlabored at rest on room air.  GI: Soft, nontender, non-distended  MS: No edema; No deformity. Neuro:  Nonfocal  Psych: Normal affect   Labs    High Sensitivity Troponin:   Recent Labs  Lab 03/02/22 1022 03/02/22 1249  TROPONINIHS 5 6     Chemistry Recent Labs  Lab 03/02/22 1022 03/03/22 0500 03/04/22 0604  NA 139 141 138  K 4.4 3.3* 3.6  CL 111 109 107  CO2 22 24 22   GLUCOSE 110* 153* 126*  BUN 12 19 19   CREATININE 0.82 0.95 0.74  CALCIUM 8.6* 9.0 8.8*  MG 2.0  --   --   PROT 6.5  --   --   ALBUMIN 3.4*  --   --   AST 48*  --   --   ALT 75*  --   --   ALKPHOS 83  --   --   BILITOT 1.1  --   --   GFRNONAA >60 >60 >60  ANIONGAP 6 8 9     Lipids No results for input(s): "CHOL", "TRIG", "HDL", "LABVLDL", "LDLCALC", "CHOLHDL" in the last 168 hours.  Hematology Recent Labs  Lab 03/02/22 1022 03/03/22 0500 03/04/22 0604  WBC 9.0 9.1 8.5  RBC 4.44 4.41 4.32  HGB 13.1 13.0 12.7  HCT 40.1 39.5 38.5  MCV 90.3 89.6 89.1  MCH 29.5 29.5 29.4  MCHC 32.7 32.9 33.0  RDW 15.7* 15.7* 15.7*  PLT 262 252 260   Thyroid  Recent Labs  Lab 03/02/22 1249  TSH 1.359    BNP Recent Labs  Lab 03/02/22 1022  BNP 577.9*    DDimer No results for input(s): "DDIMER" in the last 168 hours.   Radiology      Cardiac Studies  TTE 03/02/2022 1. Left ventricular ejection fraction, by estimation, is 40 to 45%. The  left ventricle has mildly decreased function. The left ventricle  demonstrates global hypokinesis. There is mild left ventricular  hypertrophy. Left ventricular diastolic parameters  are indeterminate.   2. Right ventricular systolic function is moderately reduced. The right  ventricular size is mildly enlarged. There is mildly elevated pulmonary  artery systolic pressure. The estimated right ventricular systolic  pressure is 41.6 mmHg.   3. Left atrial size was moderately dilated.   4. The mitral valve is normal in  structure. Mild to moderate mitral valve  regurgitation. No evidence of mitral stenosis.   5. Tricuspid valve regurgitation is moderate to severe.   6. The aortic valve is normal in structure. Aortic valve regurgitation is  not visualized. Aortic valve sclerosis/calcification is present, without  any evidence of aortic stenosis.   7. The inferior vena cava is dilated in size with <50% respiratory  variability, suggesting right atrial pressure of 15 mmHg.   8. Rhythm is atrial fibrillation.    Patient Profile     66 y.o. female with a history of persistent atrial fibrillation, hypertension, morbid obesity, stroke (02/2020), sleep apnea not on CPAP, who is being seen and evaluated for atrial fibrillation RVR and worsening shortness of breath.   Assessment & Plan    Persistent atrial fibrillation with RVR -Remains in rate controlled atrial fibrillation rates of 70-90 -Diltiazem drip was weaned off yesterday -Continued on metoprolol 150 mg twice daily and diltiazem 45 mg every 6 hours -Continued on apixaban 5 mg twice daily for CHA2DS2-VASc score of at least 6 -Is scheduled for atrial fibrillation ablation as an outpatient in February with Dr. Lalla Brothers -Potential for DCCV tomorrow with inability to adequately rate control -Continue cardiac monitoring  Acute on chronic diastolic congestive heart failure -Appears euvolemic on exam -Denies worsening shortness of breath, remains on room air satting well -Lasix continued 20 mg IV twice daily -LVEF 40-45%, moderate to severe TR, dilated IVC consistent with volume overload -Would recommend increasing PTA Lasix to 40 mg daily upon discharge -Daily weight, I's and O's, low-sodium diet  Hypertension -Blood pressure 117/81 -Continued on irbesartan 75 mg daily, metoprolol 150 mg twice daily, and diltiazem 45 mg every 6 hours -She is also continued on frusemide 20 mg IV twice daily -Daily BMP while continued on diuretic therapy -Vital signs per  unit protocol  Hypokalemia -Received potassium supplementation yesterday -Potassium level today 3.6 -Daily BMP -Monitor/trend/replete electrolytes as needed     For questions or updates, please contact [facility] Please consult [email or web address] for contact info under        Signed, Meri Pelot, NP  03/04/2022, 12:06 PM

## 2022-03-04 NOTE — TOC CM/SW Note (Signed)
  Transition of Care [facility]) Screening Note   Patient Details  Name: Regina Christensen Date of Birth: 05-28-1955   Transition of Care [facility]) CM/SW Contact:    Margarito Liner, LCSW Phone Number: 03/04/2022, 8:56 AM    Transition of Care Department [facility]) has reviewed patient and no TOC needs have been identified at this time. We will continue to monitor patient advancement through interdisciplinary progression rounds. If new patient transition needs arise, please place a TOC consult.

## 2022-03-04 NOTE — Progress Notes (Signed)
PROGRESS NOTE  Regina Christensen    DOB: 04/12/1956, 66 y.o.  FTD:[id number]    Code Status: Full Code   DOA: 03/02/2022   LOS: 2   Brief hospital course  Regina Christensen is a 66 y.o. female with a PMH significant for paroxysmal atrial fibrillation, history of CVA, chronic diastolic dysfunction CHF, morbid obesity, sleep apnea.  They presented from home to the ED on 03/02/2022 with palpitations and DOE intermittently over the past couple weeks. She has been actively seeing her cardiologist to optimize management of Afib. She had recent increase of her metoprolol to 150mg  BID. She had increased swelling, cough, DOE and orthopnea so presented to the ED.    In the ED, it was found that they were in Afib RVR with heart rate 130.  They were initially treated with metoprolol, Cardizem gtt and cardiology was consulted.  Other significant findings included normal TSH, troponin negative, unremarkable metabolic panel and CBC.  Patient was admitted to medicine service for further workup and management of Afib and other comorbidities as outlined in detail below.  03/04/22 -stable  Assessment & Plan  Principal Problem:   Atrial fibrillation with RVR (HCC) Active Problems:   Transaminitis   Morbid obesity (HCC)   GAD (generalized anxiety disorder)   OSA (obstructive sleep apnea)   Depression   Acute on chronic combined systolic and diastolic CHF (congestive heart failure) (HCC)   Dilated cardiomyopathy (HCC)   Nonrheumatic tricuspid valve regurgitation   HFrEF (heart failure with reduced ejection fraction) (HCC)   Primary hypertension  Afib RVR- now rate controlled. echo showing EF 40-45%, mod-sever TVR - cardiology consulted, appreciate your care - cardizem gtt>> PO - continue eliquis, metoprolol - planning cardioversion 11/15   Transaminitis- RUQ negative   HFrEF  HTN  mod-severe TVR- Last 2D echocardiogram was from 2021 and showed an LVEF of 60 to 65%. Now 40-45% - continue home  meds - cardiology following, appreciate recs  Hypokalemia- k+ goal 4.0 - monitor and replete PRN  Type 2 DM- non insulin dependent. Blood sugars well controlled inpatient - hold home semaglutide   Depression Continue venlafaxine   OSA (obstructive sleep apnea) Patient has morbid obesity with complications of obstructive sleep apnea She is not on CPAP as she is unable to tolerate the mask   GAD (generalized anxiety disorder) Continue venlafaxine   Morbid obesity (HCC) BMI 40.29 Complicates overall prognosis and care Lifestyle modification and exercise has been discussed with patient in detail  Body mass index is 40.83 kg/m.  VTE ppx:  apixaban (ELIQUIS) tablet 5 mg   Diet:     Diet   Diet 2 gram sodium Room service appropriate? Yes; Fluid consistency: Thin   Consultants: Cardiology   Subjective 03/04/22    Pt reports no palpitations, SOB at rest this am. Questions and concerns addressed at time of encounter.   Objective   Vitals:   03/03/22 2100 03/04/22 0002 03/04/22 0320 03/04/22 0545  BP: 123/82 103/64 108/63 130/84  Pulse: 74 73 74   Resp: 18 16 16    Temp: 97.9 F (36.6 C) 98.4 F (36.9 C) 97.6 F (36.4 C)   TempSrc: Oral Oral Oral   SpO2: 99% 96% 99%   Weight:      Height:        Intake/Output Summary (Last 24 hours) at 03/04/2022 0805 Last data filed at 03/04/2022 0300 Gross per 24 hour  Intake 992.91 ml  Output 1450 ml  Net -457.09 ml  Filed Weights   03/02/22 0959 03/03/22 0440  Weight: 120.2 kg 121.8 kg     Physical Exam:  General: awake, alert, NAD HEENT: atraumatic, clear conjunctiva, anicteric sclera, MMM, hearing grossly normal Respiratory: normal respiratory effort. Cardiovascular: quick capillary refill, normal S1/S2, RRR, no JVD, murmurs Nervous: A&O x3. no gross focal neurologic deficits, normal speech Extremities: moves all equally, no edema, normal tone Skin: dry, intact, normal temperature, normal color. No rashes,  lesions or ulcers on exposed skin Psychiatry: normal mood, congruent affect  Labs   I have personally reviewed the following labs and imaging studies CBC    Component Value Date/Time   WBC 8.5 03/04/2022 0604   RBC 4.32 03/04/2022 0604   HGB 12.7 03/04/2022 0604   HGB 12.9 11/14/2020 0910   HCT 38.5 03/04/2022 0604   HCT 40.9 11/14/2020 0910   PLT 260 03/04/2022 0604   PLT 342 11/14/2020 0910   MCV 89.1 03/04/2022 0604   MCV 90 11/14/2020 0910   MCV 89 07/10/2012 2133   MCH 29.4 03/04/2022 0604   MCHC 33.0 03/04/2022 0604   RDW 15.7 (H) 03/04/2022 0604   RDW 13.9 11/14/2020 0910   RDW 15.0 (H) 07/10/2012 2133   LYMPHSABS 1.3 03/02/2022 1022   LYMPHSABS 1.5 11/14/2020 0910   MONOABS 0.5 03/02/2022 1022   EOSABS 0.1 03/02/2022 1022   EOSABS 0.1 11/14/2020 0910   BASOSABS 0.0 03/02/2022 1022   BASOSABS 0.1 11/14/2020 0910      Latest Ref Rng & Units 03/04/2022    6:04 AM 03/03/2022    5:00 AM 03/02/2022   10:22 AM  BMP  Glucose 70 - 99 mg/dL 098126  119153  147110   BUN 8 - 23 mg/dL 19  19  12    Creatinine 0.44 - 1.00 mg/dL 8.290.74  5.620.95  1.300.82   Sodium 135 - 145 mmol/L 138  141  139   Potassium 3.5 - 5.1 mmol/L 3.6  3.3  4.4   Chloride 98 - 111 mmol/L 107  109  111   CO2 22 - 32 mmol/L 22  24  22    Calcium 8.9 - 10.3 mg/dL 8.8  9.0  8.6     US Abdomen Limited RUQ (LIVER/GB)  Result Date: 03/03/2022 CLINICAL DATA:  Transaminitis EXAM: ULTRASOUND ABDOMEN LIMITED RIGHT UPPER QUADRANT COMPARISON:  CT abdomen/pelvis 07/11/2012, right upper quadrant ultrasound 11/21/2011 FINDINGS: Gallbladder: No gallstones or wall thickening visualized. No sonographic Murphy sign noted by sonographer. Common bile duct: Diameter: 3 mm Liver: No focal lesion identified. Within normal limits in parenchymal echogenicity. Portal vein is patent on color Doppler imaging with normal direction of blood flow towards the liver. Other: There is a right pleural effusion. IMPRESSION: 1. Right pleural effusion. 2.  Otherwise normal right upper quadrant ultrasound. Electronically Signed   By: Lesia HausenPeter  Noone M.D.   On: 03/03/2022 09:52   ECHOCARDIOGRAM COMPLETE  Result Date: 03/02/2022    ECHOCARDIOGRAM REPORT   Patient Name:   Regina Christensen Date of Exam: 03/02/2022 Medical Rec #:  [id number]          Height:       68.0 in Accession #:    [id number]         Weight:       265.0 lb Date of Birth:  07/31/1955           BSA:          2.303 m Patient Age:    [age] years  BP:           123/93 mmHg Patient Gender: F                  HR:           81 bpm. Exam Location:  [facility] Procedure: 2D Echo Indications:     CHF I50.31  History:         Patient has prior history of Echocardiogram examinations, most                  recent 03/01/2020.  Sonographer:     Overton Mam RDCS Referring Phys:  [id number] AGBATA Diagnosing Phys: Julien Nordmann MD IMPRESSIONS  1. Left ventricular ejection fraction, by estimation, is 40 to 45%. The left ventricle has mildly decreased function. The left ventricle demonstrates global hypokinesis. There is mild left ventricular hypertrophy. Left ventricular diastolic parameters are indeterminate.  2. Right ventricular systolic function is moderately reduced. The right ventricular size is mildly enlarged. There is mildly elevated pulmonary artery systolic pressure. The estimated right ventricular systolic pressure is 41.6 mmHg.  3. Left atrial size was moderately dilated.  4. The mitral valve is normal in structure. Mild to moderate mitral valve regurgitation. No evidence of mitral stenosis.  5. Tricuspid valve regurgitation is moderate to severe.  6. The aortic valve is normal in structure. Aortic valve regurgitation is not visualized. Aortic valve sclerosis/calcification is present, without any evidence of aortic stenosis.  7. The inferior vena cava is dilated in size with <50% respiratory variability, suggesting right atrial pressure of 15 mmHg.  8. Rhythm is atrial fibrillation. FINDINGS   Left Ventricle: Left ventricular ejection fraction, by estimation, is 40 to 45%. The left ventricle has mildly decreased function. The left ventricle demonstrates global hypokinesis. The left ventricular internal cavity size was normal in size. There is  mild left ventricular hypertrophy. Left ventricular diastolic parameters are indeterminate. Right Ventricle: The right ventricular size is mildly enlarged. No increase in right ventricular wall thickness. Right ventricular systolic function is moderately reduced. There is mildly elevated pulmonary artery systolic pressure. The tricuspid regurgitant velocity is 2.58 m/s, and with an assumed right atrial pressure of 15 mmHg, the estimated right ventricular systolic pressure is 41.6 mmHg. Left Atrium: Left atrial size was moderately dilated. Right Atrium: Right atrial size was normal in size. Pericardium: There is no evidence of pericardial effusion. Mitral Valve: The mitral valve is normal in structure. Mild to moderate mitral valve regurgitation. No evidence of mitral valve stenosis. Tricuspid Valve: The tricuspid valve is normal in structure. Tricuspid valve regurgitation is moderate to severe. No evidence of tricuspid stenosis. Aortic Valve: The aortic valve is normal in structure. Aortic valve regurgitation is not visualized. Aortic valve sclerosis/calcification is present, without any evidence of aortic stenosis. Aortic valve peak gradient measures 4.8 mmHg. Pulmonic Valve: The pulmonic valve was normal in structure. Pulmonic valve regurgitation is not visualized. No evidence of pulmonic stenosis. Aorta: The aortic root is normal in size and structure. Venous: The inferior vena cava is dilated in size with less than 50% respiratory variability, suggesting right atrial pressure of 15 mmHg. IAS/Shunts: No atrial level shunt detected by color flow Doppler.  LEFT VENTRICLE PLAX 2D LVIDd:         4.60 cm     Diastology LVIDs:         3.40 cm     LV e' medial:    9.03  cm/s LV PW:  1.30 cm     LV E/e' medial:  12.0 LV IVS:        1.20 cm     LV e' lateral:   14.70 cm/s LVOT diam:     1.90 cm     LV E/e' lateral: 7.3 LV SV:         36 LV SV Index:   16 LVOT Area:     2.84 cm  LV Volumes (MOD) LV vol d, MOD A4C: 61.9 ml LV vol s, MOD A4C: 29.0 ml LV SV MOD A4C:     61.9 ml RIGHT VENTRICLE RV Basal diam:  4.20 cm RV S prime:     10.00 cm/s TAPSE (M-mode): 1.5 cm LEFT ATRIUM             Index        RIGHT ATRIUM           Index LA diam:        5.20 cm 2.26 cm/m   RA Area:     15.60 cm LA Vol (A2C):   62.9 ml 27.31 ml/m  RA Volume:   38.60 ml  16.76 ml/m LA Vol (A4C):   91.7 ml 39.81 ml/m LA Biplane Vol: 80.9 ml 35.12 ml/m  AORTIC VALVE                 PULMONIC VALVE AV Area (Vmax): 2.08 cm     PV Vmax:       0.88 m/s AV Vmax:        109.00 cm/s  PV Peak grad:  3.1 mmHg AV Peak Grad:   4.8 mmHg LVOT Vmax:      79.80 cm/s LVOT Vmean:     51.700 cm/s LVOT VTI:       0.128 m  AORTA Ao Root diam: 3.20 cm MITRAL VALVE                TRICUSPID VALVE MV Area (PHT): 4.93 cm     TR Peak grad:   26.6 mmHg MV Decel Time: 154 msec     TR Vmax:        258.00 cm/s MV E velocity: 108.00 cm/s                             SHUNTS                             Systemic VTI:  0.13 m                             Systemic Diam: 1.90 cm Julien Nordmann MD Electronically signed by Julien Nordmann MD Signature Date/Time: 03/02/2022/3:53:22 PM    Final    DG Chest Portable 1 View  Result Date: 03/02/2022 CLINICAL DATA:  Short of breath. Symptoms concerning for atrial fibrillation beginning 5 days ago. EXAM: PORTABLE CHEST 1 VIEW COMPARISON:  02/07/2019. FINDINGS: Cardiac silhouette is mildly enlarged. No mediastinal or hilar masses. Mild opacity at the lateral right lung base, likely scarring or chronic atelectasis. Mild chronic thickening of the minor fissure. These findings are stable from the most recent prior study. Remainder of the lungs is clear. No convincing pleural effusion and no  pneumothorax. Skeletal structures are grossly intact. IMPRESSION: No active disease. Electronically Signed   By: Amie Portland M.D.   On: 03/02/2022 10:39  Disposition Plan & Communication  Patient status: Inpatient  Admitted From: Home Planned disposition location: Home Anticipated discharge date: 11/16 pending cardiology clearance  Family Communication: none    Author: Leeroy Bock, DO [facility] 03/04/2022, 8:05 AM   Available by Epic secure chat 7AM-7PM. If 7PM-7AM, please contact night-coverage.  [facility] contact information found on [email or web address].

## 2022-03-04 NOTE — Progress Notes (Deleted)
TeleHealth Visit:  This visit was completed with telemedicine (audio/video) technology. [name] has verbally consented to this TeleHealth visit. The patient is located at home, the provider is located at home. The participants in this visit include the listed provider and patient. The visit was conducted today via MyChart video.  OBESITY Regina Christensen is here to discuss her progress with her obesity treatment plan along with follow-up of her obesity related diagnoses.   Today's visit was # 20 Starting weight: 255 lbs Starting date: 05/10/2020 Weight at last in office visit: 256 lbs on 12/18/21 Total weight loss: 0 lbs at last in office visit on 12/18/21. Today's reported weight: *** lbs No weight reported.  Nutrition Plan: the Category 1 Plan.   Current exercise: {exercise types:16438} none  Interim History: ***  Assessment/Plan:  1. ***  2. ***  3. ***  Obesity: Current BMI *** [name] {CHL AMB IS/IS NOT:210130109} currently in the action stage of change. As such, her goal is to {MWMwtloss#1:210800005}.  She has agreed to {MWMwtlossportion/plan2:23431}.   Exercise goals: {MWM EXERCISE RECS:23473}  Behavioral modification strategies: {MWMwtlossdietstrategies3:23432}.  [name] has agreed to follow-up with our clinic in {NUMBER 1-10:22536} weeks.   No orders of the defined types were placed in this encounter.   There are no discontinued medications.   No orders of the defined types were placed in this encounter.     Objective:   VITALS: Per patient if applicable, see vitals. GENERAL: Alert and in no acute distress. CARDIOPULMONARY: No increased WOB. Speaking in clear sentences.  PSYCH: Pleasant and cooperative. Speech normal rate and rhythm. Affect is appropriate. Insight and judgement are appropriate. Attention is focused, linear, and appropriate.  NEURO: Oriented as arrived to appointment on time with no prompting.   Lab Results  Component Value Date    CREATININE 0.95 03/03/2022   BUN 19 03/03/2022   NA 141 03/03/2022   K 3.3 (L) 03/03/2022   CL 109 03/03/2022   CO2 24 03/03/2022   Lab Results  Component Value Date   ALT 75 (H) 03/02/2022   AST 48 (H) 03/02/2022   ALKPHOS 83 03/02/2022   BILITOT 1.1 03/02/2022   Lab Results  Component Value Date   HGBA1C 6.0 (H) 02/10/2022   HGBA1C 5.8 (H) 08/28/2021   HGBA1C 5.6 03/13/2021   HGBA1C 6.0 (H) 08/16/2020   HGBA1C 6.2 (H) 03/01/2020   Lab Results  Component Value Date   INSULIN 8.0 08/28/2021   Lab Results  Component Value Date   TSH 1.359 03/02/2022   Lab Results  Component Value Date   CHOL 112 02/10/2022   HDL 66 02/10/2022   LDLCALC 30 02/10/2022   TRIG 79 02/10/2022   CHOLHDL 1.7 02/10/2022   Lab Results  Component Value Date   WBC 8.5 03/04/2022   HGB 12.7 03/04/2022   HCT 38.5 03/04/2022   MCV 89.1 03/04/2022   PLT 260 03/04/2022   Lab Results  Component Value Date   IRON 39 11/14/2020   TIBC 329 11/14/2020   FERRITIN 25 11/14/2020   Lab Results  Component Value Date   VD25OH 60 02/10/2022   VD25OH 38.1 08/28/2021   VD25OH 37.3 11/14/2020    Attestation Statements:   Reviewed by clinician on day of visit: allergies, medications, problem list, medical history, surgical history, family history, social history, and previous encounter notes.  ***(delete if time-based billing not used) Time spent on visit including the items listed below was *** minutes.  -preparing to see the patient (e.g., review  of tests, history, previous notes) -obtaining and/or reviewing separately obtained history -counseling and educating the patient/family/caregiver -documenting clinical information in the electronic or other health record -ordering medications, tests, or procedures -independently interpreting results and communicating results to the patient/ family/caregiver -referring and communicating with other health care professionals  -care coordination

## 2022-03-04 NOTE — Progress Notes (Signed)
   Heart Failure Nurse Navigator Note  Met with patient today she was sitting up in the bed Oertli in no acute distress.  States that she feels more rested that she slept better last night.  Went over heart failure most further questions at this time and needed very little reinforcement.  She states that she would like to think over the ventricle health program as she has not made a decision at this time.  Older not to feel worse and this is something that could even be done with follow-up in the outpatient heart failure clinic.  She voices understanding.  She has no further questions.  Pricilla Riffle RN [facility]

## 2022-03-05 ENCOUNTER — Encounter
Admission: EM | Disposition: A | Discharge status: Home / Self Care | Payer: Self-pay | Source: Home / Self Care | Attending: Student in an Organized Health Care Education/Training Program

## 2022-03-05 ENCOUNTER — Inpatient Hospital Stay: Payer: Medicare HMO | Admitting: Certified Registered Nurse Anesthetist

## 2022-03-05 ENCOUNTER — Telehealth (INDEPENDENT_AMBULATORY_CARE_PROVIDER_SITE_OTHER): Payer: Medicare HMO | Admitting: Family Medicine

## 2022-03-05 ENCOUNTER — Encounter: Payer: Self-pay | Admitting: Internal Medicine

## 2022-03-05 DIAGNOSIS — G4733 Obstructive sleep apnea (adult) (pediatric): Secondary | ICD-10-CM

## 2022-03-05 DIAGNOSIS — I361 Nonrheumatic tricuspid (valve) insufficiency: Secondary | ICD-10-CM

## 2022-03-05 DIAGNOSIS — F411 Generalized anxiety disorder: Secondary | ICD-10-CM

## 2022-03-05 DIAGNOSIS — I5043 Acute on chronic combined systolic (congestive) and diastolic (congestive) heart failure: Secondary | ICD-10-CM

## 2022-03-05 DIAGNOSIS — R7401 Elevation of levels of liver transaminase levels: Secondary | ICD-10-CM | POA: Diagnosis not present

## 2022-03-05 DIAGNOSIS — I4891 Unspecified atrial fibrillation: Secondary | ICD-10-CM | POA: Diagnosis not present

## 2022-03-05 DIAGNOSIS — F32A Depression, unspecified: Secondary | ICD-10-CM | POA: Diagnosis not present

## 2022-03-05 DIAGNOSIS — I420 Dilated cardiomyopathy: Secondary | ICD-10-CM

## 2022-03-05 HISTORY — PX: CARDIOVERSION: SHX1299

## 2022-03-05 LAB — BASIC METABOLIC PANEL
Anion gap: 7 (ref 5–15)
BUN: 18 mg/dL (ref 8–23)
CO2: 25 mmol/L (ref 22–32)
Calcium: 8.9 mg/dL (ref 8.9–10.3)
Chloride: 110 mmol/L (ref 98–111)
Creatinine, Ser: 0.85 mg/dL (ref 0.44–1.00)
GFR, Estimated: >60 mL/min (ref >60)
Glucose, Bld: 116 mg/dL — ABNORMAL HIGH (ref 70–99)
Potassium: 4 mmol/L (ref 3.5–5.1)
Sodium: 142 mmol/L (ref 135–145)

## 2022-03-05 LAB — CBC
HCT: 37.8 % (ref 36.0–46.0)
Hemoglobin: 12.2 g/dL (ref 12.0–15.0)
MCH: 28.8 pg (ref 26.0–34.0)
MCHC: 32.3 g/dL (ref 30.0–36.0)
MCV: 89.4 fL (ref 80.0–100.0)
Platelets: 248 10*3/uL (ref 150–400)
RBC: 4.23 MIL/uL (ref 3.87–5.11)
RDW: 15.7 % — ABNORMAL HIGH (ref 11.5–15.5)
WBC: 7.5 10*3/uL (ref 4.0–10.5)
nRBC: 0 % (ref 0.0–0.2)

## 2022-03-05 SURGERY — CARDIOVERSION
Anesthesia: General

## 2022-03-05 MED ORDER — PROPOFOL 10 MG/ML IV BOLUS
INTRAVENOUS | Status: DC | PRN
  Administered 2022-03-05: 30 mg via INTRAVENOUS
  Administered 2022-03-05: 10 mg via INTRAVENOUS
  Administered 2022-03-05: 20 mg via INTRAVENOUS

## 2022-03-05 MED ORDER — FUROSEMIDE 40 MG PO TABS
40.0000 mg | ORAL_TABLET | Freq: Every day | ORAL | Status: DC
  Administered 2022-03-06: 40 mg via ORAL
  Filled 2022-03-05: qty 1

## 2022-03-05 MED ORDER — DILTIAZEM HCL ER COATED BEADS 180 MG PO CP24
180.0000 mg | ORAL_CAPSULE | Freq: Every day | ORAL | Status: DC
  Administered 2022-03-05 – 2022-03-06 (×2): 180 mg via ORAL
  Filled 2022-03-05 (×2): qty 1

## 2022-03-05 NOTE — Progress Notes (Addendum)
Rounding Note    Patient Name: Regina Christensen Date of Encounter: 03/05/2022  [facility] Cardiologist: Debbe Odea, MD   Subjective   Underwent cardioversion earlier. Maintaining sinus rhythm. No acute events.   Inpatient Medications    Scheduled Meds:  [MAR Hold] apixaban  5 mg Oral BID   [MAR Hold] atorvastatin  10 mg Oral Daily   [MAR Hold] diltiazem  45 mg Oral Q6H   [MAR Hold] furosemide  40 mg Intravenous BID   [MAR Hold] gabapentin  100 mg Oral QHS   [MAR Hold] irbesartan  75 mg Oral Daily   [MAR Hold] metoprolol tartrate  150 mg Oral BID   [MAR Hold] multivitamin with minerals  1 tablet Oral Daily   [MAR Hold] venlafaxine XR  37.5 mg Oral Q breakfast   [MAR Hold] venlafaxine XR  75 mg Oral Q breakfast   [MAR Hold] Vitamin D (Ergocalciferol)  50,000 Units Oral Q7 days   Continuous Infusions:  sodium chloride 20 mL/hr at 03/05/22 0756   diltiazem (CARDIZEM) infusion Stopped (03/04/22 1000)   PRN Meds: [MAR Hold] acetaminophen, [MAR Hold] famotidine, [MAR Hold] hydrOXYzine, [MAR Hold] ondansetron **OR** [MAR Hold] ondansetron (ZOFRAN) IV   Vital Signs    Vitals:   03/05/22 0804 03/05/22 0805 03/05/22 0806 03/05/22 0807  BP:    124/74  Pulse: 73 71 72   Resp: 17 16 16    Temp:      TempSrc:      SpO2: 96% 96% 95%   Weight:      Height:        Intake/Output Summary (Last 24 hours) at 03/05/2022 0807 Last data filed at 03/04/2022 1908 Gross per 24 hour  Intake 1080 ml  Output 950 ml  Net 130 ml      03/05/2022    7:51 AM 03/03/2022    4:40 AM 03/02/2022    9:59 AM  Last 3 Weights  Weight (lbs) 268 lb 8.3 oz 268 lb 8.3 oz 265 lb  Weight (kg) 121.8 kg 121.8 kg 120.203 kg      Telemetry    Post cardioversion-Sinus rhythm. Pre-cardioversion- afib  - Personally Reviewed  ECG    Sinus rhythm - Personally Reviewed  Physical Exam   GEN: No acute distress.   Neck: No JVD Cardiac: RRR, no murmurs, rubs, or gallops.   Respiratory: Clear to auscultation bilaterally. GI: Soft, nontender, non-distended  MS: No edema; No deformity. Neuro:  Nonfocal  Psych: Normal affect   Labs    High Sensitivity Troponin:   Recent Labs  Lab 03/02/22 1022 03/02/22 1249  TROPONINIHS 5 6     Chemistry Recent Labs  Lab 03/02/22 1022 03/03/22 0500 03/04/22 0604 03/05/22 0608  NA 139 141 138 142  K 4.4 3.3* 3.6 4.0  CL 111 109 107 110  CO2 22 24 22 25   GLUCOSE 110* 153* 126* 116*  BUN 12 19 19 18   CREATININE 0.82 0.95 0.74 0.85  CALCIUM 8.6* 9.0 8.8* 8.9  MG 2.0  --   --   --   PROT 6.5  --   --   --   ALBUMIN 3.4*  --   --   --   AST 48*  --   --   --   ALT 75*  --   --   --   ALKPHOS 83  --   --   --   BILITOT 1.1  --   --   --  GFRNONAA >60 >60 >60 >60  ANIONGAP 6 8 9 7     Lipids No results for input(s): "CHOL", "TRIG", "HDL", "LABVLDL", "LDLCALC", "CHOLHDL" in the last 168 hours.  Hematology Recent Labs  Lab 03/03/22 0500 03/04/22 0604 03/05/22 0608  WBC 9.1 8.5 7.5  RBC 4.41 4.32 4.23  HGB 13.0 12.7 12.2  HCT 39.5 38.5 37.8  MCV 89.6 89.1 89.4  MCH 29.5 29.4 28.8  MCHC 32.9 33.0 32.3  RDW 15.7* 15.7* 15.7*  PLT 252 260 248   Thyroid  Recent Labs  Lab 03/02/22 1249  TSH 1.359    BNP Recent Labs  Lab 03/02/22 1022  BNP 577.9*    DDimer No results for input(s): "DDIMER" in the last 168 hours.   Radiology    13/12/23 Abdomen Limited RUQ (LIVER/GB)  Result Date: 03/03/2022 CLINICAL DATA:  Transaminitis EXAM: ULTRASOUND ABDOMEN LIMITED RIGHT UPPER QUADRANT COMPARISON:  CT abdomen/pelvis 07/11/2012, right upper quadrant ultrasound 11/21/2011 FINDINGS: Gallbladder: No gallstones or wall thickening visualized. No sonographic Murphy sign noted by sonographer. Common bile duct: Diameter: 3 mm Liver: No focal lesion identified. Within normal limits in parenchymal echogenicity. Portal vein is patent on color Doppler imaging with normal direction of blood flow towards the liver. Other: There  is a right pleural effusion. IMPRESSION: 1. Right pleural effusion. 2. Otherwise normal right upper quadrant ultrasound. Electronically Signed   By: 01/21/2012 M.D.   On: 03/03/2022 09:52    Cardiac Studies   TTE 03/02/2022  1. Left ventricular ejection fraction, by estimation, is 40 to 45%. The  left ventricle has mildly decreased function. The left ventricle  demonstrates global hypokinesis. There is mild left ventricular  hypertrophy. Left ventricular diastolic parameters  are indeterminate.   2. Right ventricular systolic function is moderately reduced. The right  ventricular size is mildly enlarged. There is mildly elevated pulmonary  artery systolic pressure. The estimated right ventricular systolic  pressure is 41.6 mmHg.   3. Left atrial size was moderately dilated.   4. The mitral valve is normal in structure. Mild to moderate mitral valve  regurgitation. No evidence of mitral stenosis.   5. Tricuspid valve regurgitation is moderate to severe.   6. The aortic valve is normal in structure. Aortic valve regurgitation is  not visualized. Aortic valve sclerosis/calcification is present, without  any evidence of aortic stenosis.   7. The inferior vena cava is dilated in size with <50% respiratory  variability, suggesting right atrial pressure of 15 mmHg.   8. Rhythm is atrial fibrillation.   Patient Profile     66 y.o. female with hx of hypertension, paroxysmal A. fib on Eliquis, CVA 02/2020 (2/2 not taking eliquis), obesity, sleep apnea who presents with shortness of breath, being seen for afib rvr.  Assessment & Plan   Afib rvr s/p DCCV -maintaining sinus -cont cardizem 45mg  q6h, through today. Start Cardizem CD 180mg  in the am -cont lopressor 150mg  bid -cont eliquis -plan discharge in the am if no set backs -RFA planned by EP -if afib returns, will consider antiarrhythmics  2. Hfref 45% -possibly tachy induced -Obtain echo as outpatient to evaluate EF with  restoration of sinus rhythm -cont lopressor and cardizem as above for now. -IV Lasix through today, start p.o. Lasix 40 mg in the a.m.  3. Obesity, osa -cont pta ozempic -will need osa eval and treatment- did not tolerate cpap mask previously-?inspire device   Total encounter time more than 50 minutes  Greater than 50% was spent in counseling  and coordination of care with the patient       Signed, Debbe Odea, MD  03/05/2022, 8:07 AM

## 2022-03-05 NOTE — Anesthesia Preprocedure Evaluation (Signed)
Anesthesia Evaluation  Patient identified by MRN, date of birth, ID band Patient awake    Reviewed: Allergy & Precautions, NPO status , Patient's Chart, lab work & pertinent test results  History of Anesthesia Complications (+) history of anesthetic complications (post-op HA)  Airway Mallampati: II  TM Distance: >3 FB     Dental  (+) Caps, Teeth Intact   Pulmonary shortness of breath and with exertion, sleep apnea (not using CPAP often) and Continuous Positive Airway Pressure Ventilation , neg COPD, neg recent URI, Not current smoker   Pulmonary exam normal        Cardiovascular hypertension, Pt. on medications (-) angina (-) Past MI and (-) CHF + dysrhythmias Atrial Fibrillation (-) Valvular Problems/Murmurs     Neuro/Psych  Headaches, neg Seizures PSYCHIATRIC DISORDERS Anxiety Depression    CVA (found on MRI), No Residual Symptoms    GI/Hepatic Neg liver ROS,GERD  Medicated and Controlled,,  Endo/Other  diabetes, Type 2  Morbid obesity  Renal/GU negative Renal ROS  negative genitourinary   Musculoskeletal  (+) Arthritis , Osteoarthritis,    Abdominal Normal abdominal exam  (+)   Peds  Hematology  (+) Blood dyscrasia, anemia   Anesthesia Other Findings Past Medical History: No date: Anxiety No date: Arthritis No date: Back pain No date: BPPV (benign paroxysmal positional vertigo) No date: Chronic diastolic HF (heart failure) (HCC) No date: Complication of anesthesia     Comment:  Post-operative hypoxia requiring supplemental oxygen No date: Current use of long term anticoagulation     Comment:  Apixaban 02/28/2020: CVA (cerebral vascular accident) (HCC)     Comment:  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; imaging from               [facility] No date: Depression No date: Edema of both lower extremities No date: GERD (gastroesophageal reflux disease) No date: Hypertension No date: IBS (irritable bowel  syndrome) No date: Lactose intolerance No date: Morbid obesity with BMI of 45.0-49.9, adult (HCC) No date: Obesity No date: Osteoarthritis No date: PAF (paroxysmal atrial fibrillation) (HCC) No date: Pre-diabetes No date: Sleep apnea     Comment:  uses CPAP machine sometimes  No date: Type 2 diabetes mellitus without complication (HCC) No date: Vitamin D deficiency  Reproductive/Obstetrics negative OB ROS                             Anesthesia Physical Anesthesia Plan  ASA: 3  Anesthesia Plan: General   Post-op Pain Management:    Induction: Intravenous  PONV Risk Score and Plan: 3 and Treatment may vary due to age or medical condition, Propofol infusion and TIVA  Airway Management Planned: Natural Airway and Nasal Cannula  Additional Equipment:   Intra-op Plan:   Post-operative Plan:   Informed Consent: I have reviewed the patients History and Physical, chart, labs and discussed the procedure including the risks, benefits and alternatives for the proposed anesthesia with the patient or authorized representative who has indicated his/her understanding and acceptance.       Plan Discussed with:   Anesthesia Plan Comments:         Anesthesia Quick Evaluation

## 2022-03-05 NOTE — Hospital Course (Signed)
Regina Christensen is a 66 y.o. female with a PMH significant for paroxysmal atrial fibrillation, history of CVA, chronic diastolic dysfunction CHF, morbid obesity, sleep apnea. They presented from home to the ED on 03/02/2022 with palpitations and DOE intermittently over the past couple weeks. She has been actively seeing her cardiologist to optimize management of Afib. She had recent increase of her metoprolol to 150mg  BID. She had increased swelling, cough, DOE and orthopnea so presented to the ED.    In the ED, it was found that they were in Afib RVR with heart rate 130. They were initially treated with metoprolol, Cardizem gtt and cardiology was consulted. Other significant findings included normal TSH, troponin negative, unremarkable metabolic panel and CBC. Patient was admitted to medicine service for further workup and management of Afib and other comorbidities as outlined in detail below. 11/14: stable, planning cardioversion tomorrow  11/15: sinus rhythm post cardioversion, monitor overnight and if afib again cardiology to consider antiarrhythmic   Consultants:  Cardiology  Procedures: 03/05/22 cardioversion       ASSESSMENT & PLAN:   Principal Problem:   Atrial fibrillation with RVR (HCC) Active Problems:   Transaminitis   Morbid obesity (HCC)   GAD (generalized anxiety disorder)   OSA (obstructive sleep apnea)   Depression   Acute on chronic combined systolic and diastolic CHF (congestive heart failure) (HCC)   Dilated cardiomyopathy (HCC)   Nonrheumatic tricuspid valve regurgitation   HFrEF (heart failure with reduced ejection fraction) (HCC)   Primary hypertension   Obesity (BMI 30-39.9)  Afib RVR- now rate controlled.  S/p cardioversion 03/05/22  echo showing EF 40-45%, mod-sever TVR - cardiology consulted, appreciate your care - cardizem gtt >> PO - continue eliquis, metoprolol - s/p cardioversion 11/15 - if no events overnight anticipate stable for discharge  11/16   Transaminitis- RUQ negative   HFrEF  HTN  mod-severe TVR  Last 2D echocardiogram was from 2021 and showed an LVEF of 60 to 65%. Now 40-45% - continue home meds - cardiology following, appreciate recs   Hypokalemia- k+ goal 4.0 - monitor and replete PRN   Type 2 DM- non insulin dependent. Blood sugars well controlled inpatient - hold home semaglutide   Depression - Continue venlafaxine   OSA (obstructive sleep apnea) Patient has morbid obesity with complications of obstructive sleep apnea She is not on CPAP as she is unable to tolerate the mask   GAD (generalized anxiety disorder) - Continue venlafaxine   Morbid obesity (HCC) BMI 40.29 Complicates overall prognosis and care - Lifestyle modification and exercise has been discussed with patient in detail     DVT prophylaxis: apixiban  Pertinent IV fluids/nutrition: no continuous IV fluids  Central lines / invasive devices: none  Code Status: FULL CODE Family Communication: none at this time  Disposition: inpatient  TOC needs: none at this time Barriers to discharge / significant pending items: cardiology recs to monitor overnight, if no events overnight anticipate stable for discharge 11/16

## 2022-03-05 NOTE — Anesthesia Procedure Notes (Signed)
Date/Time: 03/05/2022 7:56 AM  Performed by: Ginger Carne, CRNAPre-anesthesia Checklist: Patient identified, Emergency Drugs available, Suction available, Patient being monitored and Timeout performed Patient Re-evaluated:Patient Re-evaluated prior to induction Oxygen Delivery Method: Nasal cannula Preoxygenation: Pre-oxygenation with 100% oxygen Induction Type: IV induction

## 2022-03-05 NOTE — Transfer of Care (Signed)
Immediate Anesthesia Transfer of Care Note  Patient: Regina Christensen  Procedure(s) Performed: CARDIOVERSION  Patient Location: PACU  Anesthesia Type:General  Level of Consciousness: sedated  Airway & Oxygen Therapy: Patient Spontanous Breathing and Patient connected to nasal cannula oxygen  Post-op Assessment: Report given to RN and Post -op Vital signs reviewed and stable  Post vital signs: Reviewed and stable  Last Vitals:  Vitals Value Taken Time  BP 124/74 03/05/22 0807  Temp    Pulse 73 03/05/22 0807  Resp 16 03/05/22 0807  SpO2 97 % 03/05/22 0807  Vitals shown include unvalidated device data.  Last Pain:  Vitals:   03/05/22 0751  TempSrc:   PainSc: 0-No pain         Complications: No notable events documented.

## 2022-03-05 NOTE — Care Management Important Message (Signed)
Important Message  Patient Details  Name: Regina Christensen MRN: [id number] Date of Birth: Feb 19, 1956   Medicare Important Message Given:  Yes     Johnell Comings 03/05/2022, 12:30 PM

## 2022-03-05 NOTE — Progress Notes (Signed)
PROGRESS NOTE    Regina Christensen   KPT:[id number] DOB: 02/11/1956  DOA: 03/02/2022 Date of Service: 03/05/22 PCP: Regina Cory, MD     Brief Narrative / Hospital Course:  Regina Christensen is a 66 y.o. female with a PMH significant for paroxysmal atrial fibrillation, history of CVA, chronic diastolic dysfunction CHF, morbid obesity, sleep apnea. They presented from home to the ED on 03/02/2022 with palpitations and DOE intermittently over the past couple weeks. She has been actively seeing her cardiologist to optimize management of Afib. She had recent increase of her metoprolol to 150mg  BID. She had increased swelling, cough, DOE and orthopnea so presented to the ED.    In the ED, it was found that they were in Afib RVR with heart rate 130. They were initially treated with metoprolol, Cardizem gtt and cardiology was consulted. Other significant findings included normal TSH, troponin negative, unremarkable metabolic panel and CBC. Patient was admitted to medicine service for further workup and management of Afib and other comorbidities as outlined in detail below. 11/14: stable, planning cardioversion tomorrow  11/15: sinus rhythm post cardioversion, monitor overnight and if afib again cardiology to consider antiarrhythmic   Consultants:  Cardiology  Procedures: 03/05/22 cardioversion       ASSESSMENT & PLAN:   Principal Problem:   Atrial fibrillation with RVR (HCC) Active Problems:   Transaminitis   Morbid obesity (HCC)   GAD (generalized anxiety disorder)   OSA (obstructive sleep apnea)   Depression   Acute on chronic combined systolic and diastolic CHF (congestive heart failure) (HCC)   Dilated cardiomyopathy (HCC)   Nonrheumatic tricuspid valve regurgitation   HFrEF (heart failure with reduced ejection fraction) (HCC)   Primary hypertension   Obesity (BMI 30-39.9)  Afib RVR- now rate controlled.  S/p cardioversion 03/05/22  echo showing EF 40-45%, mod-sever  TVR - cardiology consulted, appreciate your care - cardizem gtt >> PO - continue eliquis, metoprolol - s/p cardioversion 11/15 - if no events overnight anticipate stable for discharge 11/16   Transaminitis- RUQ negative   HFrEF  HTN  mod-severe TVR  Last 2D echocardiogram was from 2021 and showed an LVEF of 60 to 65%. Now 40-45% - continue home meds - cardiology following, appreciate recs   Hypokalemia- k+ goal 4.0 - monitor and replete PRN   Type 2 DM- non insulin dependent. Blood sugars well controlled inpatient - hold home semaglutide   Depression - Continue venlafaxine   OSA (obstructive sleep apnea) Patient has morbid obesity with complications of obstructive sleep apnea She is not on CPAP as she is unable to tolerate the mask   GAD (generalized anxiety disorder) - Continue venlafaxine   Morbid obesity (HCC) BMI 40.29 Complicates overall prognosis and care - Lifestyle modification and exercise has been discussed with patient in detail     DVT prophylaxis: apixiban  Pertinent IV fluids/nutrition: no continuous IV fluids  Central lines / invasive devices: none  Code Status: FULL CODE Family Communication: none at this time  Disposition: inpatient  TOC needs: none at this time Barriers to discharge / significant pending items: cardiology recs to monitor overnight, if no events overnight anticipate stable for discharge 11/16             Subjective:  Patient reports feeling well following cardioversion, brief moment this morning of palpitations but otherwise no concerns        Objective:  Vitals:   03/05/22 0850 03/05/22 0855 03/05/22 0940 03/05/22 1142  BP: 139/72 136/70 129/72 03/07/22)  118/58  Pulse: 67 67 68 71  Resp: 18 19  18   Temp:    (!) 97.5 F (36.4 C)  TempSrc:    Oral  SpO2: 98% 98% 100% 97%  Weight:      Height:        Intake/Output Summary (Last 24 hours) at 03/05/2022 1415 Last data filed at 03/05/2022 1009 Gross per 24  hour  Intake 639.68 ml  Output 400 ml  Net 239.68 ml   Filed Weights   03/02/22 0959 03/03/22 0440 03/05/22 0751  Weight: 120.2 kg 121.8 kg 121.8 kg    Examination: Constitutional:  VS as above General Appearance: alert, well-developed, well-nourished, NAD Respiratory: Normal respiratory effort No wheeze No rhonchi No rales Cardiovascular: S1/S2 normal RRR No rub/gallop auscultated Gastrointestinal: No tenderness Musculoskeletal:  No clubbing/cyanosis of digits Symmetrical movement in all extremities Neurological: No cranial nerve deficit on limited exam Alert Psychiatric: Normal judgment/insight Normal mood and affect       Scheduled Medications:   apixaban  5 mg Oral BID   atorvastatin  10 mg Oral Daily   diltiazem  45 mg Oral Q6H   furosemide  40 mg Intravenous BID   gabapentin  100 mg Oral QHS   irbesartan  75 mg Oral Daily   metoprolol tartrate  150 mg Oral BID   multivitamin with minerals  1 tablet Oral Daily   venlafaxine XR  37.5 mg Oral Q breakfast   venlafaxine XR  75 mg Oral Q breakfast   Vitamin D (Ergocalciferol)  50,000 Units Oral Q7 days    Continuous Infusions:   PRN Medications:  acetaminophen, famotidine, hydrOXYzine, ondansetron **OR** ondansetron (ZOFRAN) IV  Antimicrobials:  Anti-infectives (From admission, onward)    None       Data Reviewed: I have personally reviewed following labs and imaging studies  CBC: Recent Labs  Lab 03/02/22 1022 03/03/22 0500 03/04/22 0604 03/05/22 0608  WBC 9.0 9.1 8.5 7.5  NEUTROABS 7.0  --   --   --   HGB 13.1 13.0 12.7 12.2  HCT 40.1 39.5 38.5 37.8  MCV 90.3 89.6 89.1 89.4  PLT 262 252 260 248   Basic Metabolic Panel: Recent Labs  Lab 03/02/22 1022 03/03/22 0500 03/04/22 0604 03/05/22 0608  NA 139 141 138 142  K 4.4 3.3* 3.6 4.0  CL 111 109 107 110  CO2 22 24 22 25   GLUCOSE 110* 153* 126* 116*  BUN 12 19 19 18   CREATININE 0.82 0.95 0.74 0.85  CALCIUM 8.6* 9.0 8.8*  8.9  MG 2.0  --   --   --    GFR: Estimated Creatinine Clearance: 90.7 mL/min (by C-G formula based on SCr of 0.85 mg/dL). Liver Function Tests: Recent Labs  Lab 03/02/22 1022  AST 48*  ALT 75*  ALKPHOS 83  BILITOT 1.1  PROT 6.5  ALBUMIN 3.4*   No results for input(s): "LIPASE", "AMYLASE" in the last 168 hours. No results for input(s): "AMMONIA" in the last 168 hours. Coagulation Profile: Recent Labs  Lab 03/04/22 1931  INR 1.4*   Cardiac Enzymes: No results for input(s): "CKTOTAL", "CKMB", "CKMBINDEX", "TROPONINI" in the last 168 hours. BNP (last 3 results) No results for input(s): "PROBNP" in the last 8760 hours. HbA1C: No results for input(s): "HGBA1C" in the last 72 hours. CBG: No results for input(s): "GLUCAP" in the last 168 hours. Lipid Profile: No results for input(s): "CHOL", "HDL", "LDLCALC", "TRIG", "CHOLHDL", "LDLDIRECT" in the last 72 hours. Thyroid Function Tests:  No results for input(s): "TSH", "T4TOTAL", "FREET4", "T3FREE", "THYROIDAB" in the last 72 hours. Anemia Panel: No results for input(s): "VITAMINB12", "FOLATE", "FERRITIN", "TIBC", "IRON", "RETICCTPCT" in the last 72 hours. Urine analysis:    Component Value Date/Time   COLORURINE YELLOW (A) 02/08/2019 1158   APPEARANCEUR CLEAR (A) 02/08/2019 1158   APPEARANCEUR Clear 07/11/2012 0413   LABSPEC 1.009 02/08/2019 1158   LABSPEC 1.027 07/11/2012 0413   PHURINE 6.0 02/08/2019 1158   GLUCOSEU NEGATIVE 02/08/2019 1158   GLUCOSEU Negative 07/11/2012 0413   HGBUR NEGATIVE 02/08/2019 1158   BILIRUBINUR NEGATIVE 02/08/2019 1158   BILIRUBINUR negative 10/23/2016 0835   BILIRUBINUR Negative 07/11/2012 0413   KETONESUR NEGATIVE 02/08/2019 1158   PROTEINUR NEGATIVE 02/08/2019 1158   UROBILINOGEN 1.0 10/23/2016 0835   NITRITE NEGATIVE 02/08/2019 1158   LEUKOCYTESUR NEGATIVE 02/08/2019 1158   LEUKOCYTESUR Negative 07/11/2012 0413   Sepsis Labs: (procalcitonin:4,lacticidven:4)  No results  found for this or any previous visit (from the past 240 hour(s)).       Radiology Studies: US Abdomen Limited RUQ (LIVER/GB)  Result Date: 03/03/2022 CLINICAL DATA:  Transaminitis EXAM: ULTRASOUND ABDOMEN LIMITED RIGHT UPPER QUADRANT COMPARISON:  CT abdomen/pelvis 07/11/2012, right upper quadrant ultrasound 11/21/2011 FINDINGS: Gallbladder: No gallstones or wall thickening visualized. No sonographic Murphy sign noted by sonographer. Common bile duct: Diameter: 3 mm Liver: No focal lesion identified. Within normal limits in parenchymal echogenicity. Portal vein is patent on color Doppler imaging with normal direction of blood flow towards the liver. Other: There is a right pleural effusion. IMPRESSION: 1. Right pleural effusion. 2. Otherwise normal right upper quadrant ultrasound. Electronically Signed   By: Lesia Hausen M.D.   On: 03/03/2022 09:52   ECHOCARDIOGRAM COMPLETE  Result Date: 03/02/2022    ECHOCARDIOGRAM REPORT   Patient Name:   Regina Christensen Date of Exam: 03/02/2022 Medical Rec #:  [id number]          Height:       68.0 in Accession #:    [id number]         Weight:       265.0 lb Date of Birth:  1955-10-28           BSA:          2.303 m Patient Age:    66 years           BP:           123/93 mmHg Patient Gender: F                  HR:           81 bpm. Exam Location:  [facility] Procedure: 2D Echo Indications:     CHF I50.31  History:         Patient has prior history of Echocardiogram examinations, most                  recent 03/01/2020.  Sonographer:     Overton Mam RDCS Referring Phys:  [id number] AGBATA Diagnosing Phys: Julien Nordmann MD IMPRESSIONS  1. Left ventricular ejection fraction, by estimation, is 40 to 45%. The left ventricle has mildly decreased function. The left ventricle demonstrates global hypokinesis. There is mild left ventricular hypertrophy. Left ventricular diastolic parameters are indeterminate.  2. Right ventricular systolic function is moderately  reduced. The right ventricular size is mildly enlarged. There is mildly elevated pulmonary artery systolic pressure. The estimated right ventricular systolic pressure is 41.6 mmHg.  3. Left atrial size  was moderately dilated.  4. The mitral valve is normal in structure. Mild to moderate mitral valve regurgitation. No evidence of mitral stenosis.  5. Tricuspid valve regurgitation is moderate to severe.  6. The aortic valve is normal in structure. Aortic valve regurgitation is not visualized. Aortic valve sclerosis/calcification is present, without any evidence of aortic stenosis.  7. The inferior vena cava is dilated in size with <50% respiratory variability, suggesting right atrial pressure of 15 mmHg.  8. Rhythm is atrial fibrillation. FINDINGS  Left Ventricle: Left ventricular ejection fraction, by estimation, is 40 to 45%. The left ventricle has mildly decreased function. The left ventricle demonstrates global hypokinesis. The left ventricular internal cavity size was normal in size. There is  mild left ventricular hypertrophy. Left ventricular diastolic parameters are indeterminate. Right Ventricle: The right ventricular size is mildly enlarged. No increase in right ventricular wall thickness. Right ventricular systolic function is moderately reduced. There is mildly elevated pulmonary artery systolic pressure. The tricuspid regurgitant velocity is 2.58 m/s, and with an assumed right atrial pressure of 15 mmHg, the estimated right ventricular systolic pressure is 41.6 mmHg. Left Atrium: Left atrial size was moderately dilated. Right Atrium: Right atrial size was normal in size. Pericardium: There is no evidence of pericardial effusion. Mitral Valve: The mitral valve is normal in structure. Mild to moderate mitral valve regurgitation. No evidence of mitral valve stenosis. Tricuspid Valve: The tricuspid valve is normal in structure. Tricuspid valve regurgitation is moderate to severe. No evidence of tricuspid  stenosis. Aortic Valve: The aortic valve is normal in structure. Aortic valve regurgitation is not visualized. Aortic valve sclerosis/calcification is present, without any evidence of aortic stenosis. Aortic valve peak gradient measures 4.8 mmHg. Pulmonic Valve: The pulmonic valve was normal in structure. Pulmonic valve regurgitation is not visualized. No evidence of pulmonic stenosis. Aorta: The aortic root is normal in size and structure. Venous: The inferior vena cava is dilated in size with less than 50% respiratory variability, suggesting right atrial pressure of 15 mmHg. IAS/Shunts: No atrial level shunt detected by color flow Doppler.  LEFT VENTRICLE PLAX 2D LVIDd:         4.60 cm     Diastology LVIDs:         3.40 cm     LV e' medial:    9.03 cm/s LV PW:         1.30 cm     LV E/e' medial:  12.0 LV IVS:        1.20 cm     LV e' lateral:   14.70 cm/s LVOT diam:     1.90 cm     LV E/e' lateral: 7.3 LV SV:         36 LV SV Index:   16 LVOT Area:     2.84 cm  LV Volumes (MOD) LV vol d, MOD A4C: 61.9 ml LV vol s, MOD A4C: 29.0 ml LV SV MOD A4C:     61.9 ml RIGHT VENTRICLE RV Basal diam:  4.20 cm RV S prime:     10.00 cm/s TAPSE (M-mode): 1.5 cm LEFT ATRIUM             Index        RIGHT ATRIUM           Index LA diam:        5.20 cm 2.26 cm/m   RA Area:     15.60 cm LA Vol (A2C):   62.9 ml 27.31 ml/m  RA Volume:   38.60 ml  16.76 ml/m LA Vol (A4C):   91.7 ml 39.81 ml/m LA Biplane Vol: 80.9 ml 35.12 ml/m  AORTIC VALVE                 PULMONIC VALVE AV Area (Vmax): 2.08 cm     PV Vmax:       0.88 m/s AV Vmax:        109.00 cm/s  PV Peak grad:  3.1 mmHg AV Peak Grad:   4.8 mmHg LVOT Vmax:      79.80 cm/s LVOT Vmean:     51.700 cm/s LVOT VTI:       0.128 m  AORTA Ao Root diam: 3.20 cm MITRAL VALVE                TRICUSPID VALVE MV Area (PHT): 4.93 cm     TR Peak grad:   26.6 mmHg MV Decel Time: 154 msec     TR Vmax:        258.00 cm/s MV E velocity: 108.00 cm/s                             SHUNTS                              Systemic VTI:  0.13 m                             Systemic Diam: 1.90 cm Julien Nordmann MD Electronically signed by Julien Nordmann MD Signature Date/Time: 03/02/2022/3:53:22 PM    Final    DG Chest Portable 1 View  Result Date: 03/02/2022 CLINICAL DATA:  Short of breath. Symptoms concerning for atrial fibrillation beginning 5 days ago. EXAM: PORTABLE CHEST 1 VIEW COMPARISON:  02/07/2019. FINDINGS: Cardiac silhouette is mildly enlarged. No mediastinal or hilar masses. Mild opacity at the lateral right lung base, likely scarring or chronic atelectasis. Mild chronic thickening of the minor fissure. These findings are stable from the most recent prior study. Remainder of the lungs is clear. No convincing pleural effusion and no pneumothorax. Skeletal structures are grossly intact. IMPRESSION: No active disease. Electronically Signed   By: Amie Portland M.D.   On: 03/02/2022 10:39            LOS: 3 days      Sunnie Nielsen, DO [facility] 03/05/2022, 2:15 PM   Staff may message me via secure chat in Epic  but this may not receive immediate response,  please page for urgent matters!  If 7PM-7AM, please contact night-coverage [email or web address]  Dictation software was used to generate the above note. Typos may occur and escape review, as with typed/written notes. Please contact Dr Lyn Hollingshead directly for clarity if needed.

## 2022-03-05 NOTE — Procedures (Signed)
Cardioversion procedure note For atrial fibrillation.  Procedure Details:  Consent: Risks of procedure as well as the alternatives and risks of each were explained to the (patient/caregiver).  Consent for procedure obtained.  Time Out: Verified patient identification, verified procedure, site/side was marked, verified correct patient position, special equipment/implants available, medications/allergies/relevent history reviewed, required imaging and test results available.  Performed  Patient placed on cardiac monitor, pulse oximetry, supplemental oxygen as necessary.   Sedation given: propofol IV, per anesthesia team Pacer pads placed anterior and posterior chest.   Cardioverted 1 time(s).   Cardioverted at  200J. Synchronized biphasic Converted to NSR   Evaluation: Findings: Post procedure EKG shows: NSR Complications: None Patient did tolerate procedure well.  Time Spent Directly with the Patient:  25 minutes   Regina Christensen, M.D.  

## 2022-03-06 DIAGNOSIS — I5032 Chronic diastolic (congestive) heart failure: Secondary | ICD-10-CM | POA: Diagnosis not present

## 2022-03-06 DIAGNOSIS — I4891 Unspecified atrial fibrillation: Secondary | ICD-10-CM | POA: Diagnosis not present

## 2022-03-06 DIAGNOSIS — I10 Essential (primary) hypertension: Secondary | ICD-10-CM | POA: Diagnosis not present

## 2022-03-06 DIAGNOSIS — R600 Localized edema: Secondary | ICD-10-CM

## 2022-03-06 DIAGNOSIS — G4733 Obstructive sleep apnea (adult) (pediatric): Secondary | ICD-10-CM | POA: Diagnosis not present

## 2022-03-06 DIAGNOSIS — I5042 Chronic combined systolic (congestive) and diastolic (congestive) heart failure: Secondary | ICD-10-CM

## 2022-03-06 DIAGNOSIS — I420 Dilated cardiomyopathy: Secondary | ICD-10-CM | POA: Diagnosis not present

## 2022-03-06 MED ORDER — OLMESARTAN MEDOXOMIL 20 MG PO TABS: 20.0000 mg | ORAL_TABLET | Freq: Every day | ORAL | 0 refills | Status: DC

## 2022-03-06 MED ORDER — FUROSEMIDE 40 MG PO TABS: 40.0000 mg | ORAL_TABLET | Freq: Every day | ORAL | 0 refills | Status: DC

## 2022-03-06 MED ORDER — DILTIAZEM HCL ER COATED BEADS 180 MG PO CP24: 180.0000 mg | ORAL_CAPSULE | Freq: Every day | ORAL | 0 refills | Status: DC

## 2022-03-06 MED ORDER — METOPROLOL TARTRATE 75 MG PO TABS: 150.0000 mg | ORAL_TABLET | Freq: Two times a day (BID) | ORAL | 0 refills | Status: DC

## 2022-03-06 MED ORDER — POTASSIUM CHLORIDE CRYS ER 10 MEQ PO TBCR: 20.0000 meq | EXTENDED_RELEASE_TABLET | Freq: Every day | ORAL | 0 refills | Status: DC

## 2022-03-06 NOTE — Progress Notes (Signed)
Rounding Note    Patient Name: Regina Christensen Date of Encounter: 03/06/2022  [facility] Cardiologist: Debbe Odea, MD   Subjective   Feels well this morning Underwent cardioversion yesterday, maintaining normal sinus rhythm Has received diltiazem ER 180 daily, metoprolol 150 twice daily Reports she has difficulty tolerating her CPAP mask  Inpatient Medications    Scheduled Meds:  apixaban  5 mg Oral BID   atorvastatin  10 mg Oral Daily   diltiazem  180 mg Oral Daily   furosemide  40 mg Oral Daily   gabapentin  100 mg Oral QHS   irbesartan  75 mg Oral Daily   metoprolol tartrate  150 mg Oral BID   multivitamin with minerals  1 tablet Oral Daily   venlafaxine XR  37.5 mg Oral Q breakfast   venlafaxine XR  75 mg Oral Q breakfast   Vitamin D (Ergocalciferol)  50,000 Units Oral Q7 days   Continuous Infusions:  PRN Meds: acetaminophen, famotidine, hydrOXYzine, ondansetron **OR** ondansetron (ZOFRAN) IV   Vital Signs    Vitals:   03/05/22 2032 03/05/22 2319 03/06/22 0547 03/06/22 0731  BP: 128/74 130/70 (!) 153/84 119/62  Pulse: 74 70 71 67  Resp: 18 18 18 18   Temp: 97.8 F (36.6 C) 97.7 F (36.5 C) 97.7 F (36.5 C) 97.9 F (36.6 C)  TempSrc:    Oral  SpO2: 100% 96% 93% 96%  Weight:      Height:        Intake/Output Summary (Last 24 hours) at 03/06/2022 1108 Last data filed at 03/06/2022 0900 Gross per 24 hour  Intake 1080 ml  Output 1000 ml  Net 80 ml      03/05/2022    7:51 AM 03/03/2022    4:40 AM 03/02/2022    9:59 AM  Last 3 Weights  Weight (lbs) 268 lb 8.3 oz 268 lb 8.3 oz 265 lb  Weight (kg) 121.8 kg 121.8 kg 120.203 kg      Telemetry    Normal sinus rhythm rate 70- Personally Reviewed  ECG     - Personally Reviewed  Physical Exam   GEN: No acute distress.  obese Neck: No JVD Cardiac: RRR, no murmurs, rubs, or gallops.  Respiratory: Clear to auscultation bilaterally. GI: Soft, nontender, non-distended  MS:  No edema; No deformity. Neuro:  Nonfocal  Psych: Normal affect   Labs    High Sensitivity Troponin:   Recent Labs  Lab 03/02/22 1022 03/02/22 1249  TROPONINIHS 5 6     Chemistry Recent Labs  Lab 03/02/22 1022 03/03/22 0500 03/04/22 0604 03/05/22 0608  NA 139 141 138 142  K 4.4 3.3* 3.6 4.0  CL 111 109 107 110  CO2 22 24 22 25   GLUCOSE 110* 153* 126* 116*  BUN 12 19 19 18   CREATININE 0.82 0.95 0.74 0.85  CALCIUM 8.6* 9.0 8.8* 8.9  MG 2.0  --   --   --   PROT 6.5  --   --   --   ALBUMIN 3.4*  --   --   --   AST 48*  --   --   --   ALT 75*  --   --   --   ALKPHOS 83  --   --   --   BILITOT 1.1  --   --   --   GFRNONAA >60 >60 >60 >60  ANIONGAP 6 8 9 7     Lipids No results for input(s): "  CHOL", "TRIG", "HDL", "LABVLDL", "LDLCALC", "CHOLHDL" in the last 168 hours.  Hematology Recent Labs  Lab 03/03/22 0500 03/04/22 0604 03/05/22 0608  WBC 9.1 8.5 7.5  RBC 4.41 4.32 4.23  HGB 13.0 12.7 12.2  HCT 39.5 38.5 37.8  MCV 89.6 89.1 89.4  MCH 29.5 29.4 28.8  MCHC 32.9 33.0 32.3  RDW 15.7* 15.7* 15.7*  PLT 252 260 248   Thyroid  Recent Labs  Lab 03/02/22 1249  TSH 1.359    BNP Recent Labs  Lab 03/02/22 1022  BNP 577.9*    DDimer No results for input(s): "DDIMER" in the last 168 hours.   Radiology    No results found.  Cardiac Studies     Patient Profile     66 y.o. female with hx of hypertension, paroxysmal A. fib on Eliquis, CVA 02/2020 (2/2 not taking eliquis), obesity, sleep apnea who presents with shortness of breath, being seen for afib rvr.   Assessment & Plan    Afib rvr s/p DCCV Worsening heart failure symptoms as outpatient with poorly controlled rate Cardioversion yesterday Maintaining normal sinus rhythm on Cardizem ER 180 daily metoprolol tartrate 150 twice daily On Eliquis 5 twice daily Scheduled for ablation February 2024 -if afib returns, will consider antiarrhythmics Discussed methods to monitor rate and rhythm at home   2.   Cardiomyopathy  EF estimated 45% in afib -possibly tachy induced -Obtain echo as outpatient to evaluate EF with restoration of sinus rhythm -cont lopressor and cardizem as above  Lasix 40 mg in the a.m.   3. Morbid Obesity,  On ozempic  4. OSA Reports she has difficulty tolerating CPAP On Ozempic for weight loss  5.  Essential hypertension Hold amlodipine, olmesartan down to 20 mg daily Diltiazem ER 180 daily started, continue metoprolol 150 twice daily  Discharge instructions discussed with her  Total encounter time more than 50 minutes  Greater than 50% was spent in counseling and coordination of care with the patient   For questions or updates, please contact [facility] Please consult [email or web address] for contact info under        Signed, Julien Nordmann, MD  03/06/2022, 11:08 AM

## 2022-03-06 NOTE — Progress Notes (Signed)
   Heart Failure Nurse Navigator Note  Met with patient today, she is lying in bed conversing with her sister.  States that she is feeling better and that they are going to keep her for 1 more day to watch her heart rhythm.  Discussed the ventricle health program, patient states that she has a smart watch can monitor her rhythm with that so at this time she is not interested in the program.  She has no further questions.  Pricilla Riffle RN [facility]

## 2022-03-06 NOTE — Discharge Summary (Signed)
Physician Discharge Summary   Patient: Regina Christensen Asmus MRN: [id number]  DOB: 08/18/1955   Admit:     Date of Admission: 03/02/2022 Admitted from: home   Discharge: Date of discharge: 03/06/22 Disposition: Home Condition at discharge: good  CODE STATUS: FULL CODE     Discharge Physician: Sunnie NielsenNatalie Lakeyshia Tuckerman, DO [facility]     PCP: Alba CorySowles, Krichna, MD  Recommendations for Outpatient Follow-up:  Follow up with PCP Alba CorySowles, Krichna, MD in 2-4 weeks Please obtain labs/tests: CBC, BMP in follow up visit Follow as directred w/ cardiology  See med rec for medication changes  Please follow up on the following pending results: none PCP AND OTHER OUTPATIENT PROVIDERS: SEE BELOW FOR SPECIFIC DISCHARGE INSTRUCTIONS PRINTED FOR PATIENT IN ADDITION TO GENERIC AVS PATIENT INFO    Discharge Instructions     Diet - low sodium heart healthy   Complete by: As directed    Increase activity slowly   Complete by: As directed          Discharge Diagnoses: Principal Problem:   Atrial fibrillation with RVR (HCC) Active Problems:   Transaminitis   Morbid obesity (HCC)   GAD (generalized anxiety disorder)   OSA (obstructive sleep apnea)   Depression   Acute on chronic combined systolic and diastolic CHF (congestive heart failure) (HCC)   Dilated cardiomyopathy (HCC)   Nonrheumatic tricuspid valve regurgitation   HFrEF (heart failure with reduced ejection fraction) (HCC)   Primary hypertension   Obesity (BMI 30-39.9)       Hospital Course: Regina Christensen Mcglocklin is a 66 y.o. female with a PMH significant for paroxysmal atrial fibrillation, history of CVA, chronic diastolic dysfunction CHF, morbid obesity, sleep apnea. They presented from home to the ED on 03/02/2022 with palpitations and DOE intermittently over the past couple weeks. She has been actively seeing her cardiologist to optimize management of Afib. She had recent increase of her metoprolol to 150mg  BID. She  had increased swelling, cough, DOE and orthopnea so presented to the ED.    In the ED, it was found that they were in Afib RVR with heart rate 130. They were initially treated with metoprolol, Cardizem gtt and cardiology was consulted. Other significant findings included normal TSH, troponin negative, unremarkable metabolic panel and CBC. Patient was admitted to medicine service for further workup and management of Afib and other comorbidities as outlined in detail below. 11/14: stable, planning cardioversion tomorrow  11/15: sinus rhythm post cardioversion, monitor overnight and if afib again cardiology to consider antiarrhythmic  11/16: doing well overnight, spoke w/ cardiology this morning to confirm med changes and okay for discharge hmoe - no concerns see med rec for details   Consultants:  Cardiology  Procedures: 03/05/22 cardioversion       ASSESSMENT & PLAN:   Principal Problem:   Atrial fibrillation with RVR (HCC) Active Problems:   Transaminitis   Morbid obesity (HCC)   GAD (generalized anxiety disorder)   OSA (obstructive sleep apnea)   Depression   Acute on chronic combined systolic and diastolic CHF (congestive heart failure) (HCC)   Dilated cardiomyopathy (HCC)   Nonrheumatic tricuspid valve regurgitation   HFrEF (heart failure with reduced ejection fraction) (HCC)   Primary hypertension   Obesity (BMI 30-39.9)  Afib RVR- now rate controlled.  Christensen/p cardioversion 03/05/22  echo showing EF 40-45%, mod-sever TVR - cardiology consulted, appreciate your care - cardizem gtt >> PO - continue eliquis, metoprolol - Christensen/p cardioversion 11/15   Transaminitis- RUQ negative  HFrEF  HTN  mod-severe TVR  Last 2D echocardiogram was from 2021 and showed an LVEF of 60 to 65%. Now 40-45% - home meds per med rec  - cardiology following, appreciate recs   Hypokalemia- k+ goal 4.0 - monitor and replete PRN   Type 2 DM- non insulin dependent. Blood sugars well controlled  inpatient - hold home semaglutide   Depression - Continue venlafaxine   OSA (obstructive sleep apnea) Patient has morbid obesity with complications of obstructive sleep apnea She is not on CPAP as she is unable to tolerate the mask   GAD (generalized anxiety disorder) - Continue venlafaxine   Morbid obesity (HCC) BMI 40.29 Complicates overall prognosis and care - Lifestyle modification and exercise has been discussed with patient in detail             Discharge Instructions  Allergies as of 03/06/2022       Reactions   Hydrocodone-acetaminophen Other (See Comments)   Extreme headache   Ace Inhibitors Cough   Hctz [hydrochlorothiazide] Rash   face        Medication List     STOP taking these medications    amLODipine 5 MG tablet Commonly known as: NORVASC       TAKE these medications    acetaminophen 650 MG CR tablet Commonly known as: TYLENOL Take 1 tablet (650 mg total) by mouth every 8 (eight) hours as needed for pain.   apixaban 5 MG Tabs tablet Commonly known as: Eliquis Take 1 tablet (5 mg total) by mouth 2 (two) times daily.   atorvastatin 10 MG tablet Commonly known as: LIPITOR Take 1 tablet (10 mg total) by mouth daily.   benzonatate 100 MG capsule Commonly known as: TESSALON Take 2 capsules (200 mg total) by mouth 2 (two) times daily as needed for cough.   diltiazem 180 MG 24 hr capsule Commonly known as: CARDIZEM CD Take 1 capsule (180 mg total) by mouth daily. Start taking on: March 07, 2022   famotidine 20 MG tablet Commonly known as: PEPCID Take 1 tablet (20 mg total) by mouth daily as needed for indigestion.   furosemide 40 MG tablet Commonly known as: LASIX Take 1 tablet (40 mg total) by mouth daily. What changed:  medication strength how much to take when to take this reasons to take this additional instructions   gabapentin 100 MG capsule Commonly known as: NEURONTIN Take 100 mg by mouth at bedtime.    hydrOXYzine 25 MG tablet Commonly known as: ATARAX TAKE 0.5-1 TABLETS (12.5-25 MG TOTAL) BY MOUTH AT BEDTIME AS NEEDED. FOR SLEEP   meclizine 12.5 MG tablet Commonly known as: ANTIVERT Take 1 tablet (12.5 mg total) by mouth 3 (three) times daily as needed for dizziness.   Metoprolol Tartrate 75 MG Tabs Take 150 mg by mouth 2 (two) times daily. What changed: medication strength   multivitamin tablet Take 1 tablet by mouth daily. BariatricPal Multivitamin   olmesartan 20 MG tablet Commonly known as: BENICAR Take 1 tablet (20 mg total) by mouth daily. What changed:  medication strength how much to take   Ozempic (2 MG/DOSE) 8 MG/3ML Sopn Generic drug: Semaglutide (2 MG/DOSE) Inject 2 mg into the skin once a week.   potassium chloride 10 MEQ tablet Commonly known as: KLOR-CON M Take 2 tablets (20 mEq total) by mouth daily. What changed: additional instructions   triamcinolone 55 MCG/ACT Aero nasal inhaler Commonly known as: NASACORT Place 2 sprays into the nose daily.   venlafaxine  XR 75 MG 24 hr capsule Commonly known as: EFFEXOR-XR Take 1 capsule (75 mg total) by mouth daily with breakfast. Take along with 37.5 mg daily   venlafaxine XR 37.5 MG 24 hr capsule Commonly known as: EFFEXOR-XR Take 1 capsule (37.5 mg total) by mouth daily with breakfast. Take along with 75 mg daily   Vitamin D (Ergocalciferol) 1.25 MG (50000 UNIT) Caps capsule Commonly known as: DRISDOL Take 1 capsule (50,000 Units total) by mouth every 7 (seven) days.   Xiidra 5 % Soln Generic drug: Lifitegrast Place 1 drop into both eyes 2 (two) times daily.         Follow-up Information     Debbe Odea, MD Follow up.   Specialties: Cardiology, Radiology Contact information: [address] [place] [address] [phone]         [name], MD Follow up.   Specialty: Family Medicine Contact information: [address] [place]  [address] [phone]                 Allergies  Allergen Reactions   Hydrocodone-Acetaminophen Other (See Comments)    Extreme headache   Ace Inhibitors Cough   Hctz [Hydrochlorothiazide] Rash    face     Subjective: pt feeling well, no chest pain or palpitations, no SOB.    Discharge Exam: BP 119/62 (BP Location: Left Arm)   Pulse 67   Temp 97.9 F (36.6 C) (Oral)   Resp 18   Ht  (1.727 m)   Wt 121.8 kg   SpO2 96%   BMI 40.83 kg/m  General: Pt is alert, awake, not in acute distress Cardiovascular: RRR, S1/S2 +, no rubs, no gallops Respiratory: CTA bilaterally, no wheezing, no rhonchi Abdominal: Soft, NT, ND, bowel sounds + Extremities: no edema, no cyanosis     The results of significant diagnostics from this hospitalization (including imaging, microbiology, ancillary and laboratory) are listed below for reference.     Microbiology: No results found for this or any previous visit (from the past 240 hour(Christensen)).   Labs: BNP (last 3 results) Recent Labs    03/02/22 1022  BNP 577.9*   Basic Metabolic Panel: Recent Labs  Lab 03/02/22 1022 03/03/22 0500 03/04/22 0604 03/05/22 0608  NA 139 141 138 142  K 4.4 3.3* 3.6 4.0  CL 111 109 107 110  CO2 GLUCOSE 110* 153* 126* 116*  BUN CREATININE 0.82 0.95 0.74 0.85  CALCIUM 8.6* 9.0 8.8* 8.9  MG 2.0  --   --   --    Liver Function Tests: Recent Labs  Lab 03/02/22 1022  AST 48*  ALT 75*  ALKPHOS 83  BILITOT 1.1  PROT 6.5  ALBUMIN 3.4*   No results for input(Christensen): "LIPASE", "AMYLASE" in the last 168 hours. No results for input(Christensen): "AMMONIA" in the last 168 hours. CBC: Recent Labs  Lab 03/02/22 1022 03/03/22 0500 03/04/22 0604 03/05/22 0608  WBC 9.0 9.1 8.5 7.5  NEUTROABS 7.0  --   --   --   HGB 13.1 13.0 12.7 12.2  HCT 40.1 39.5 38.5 37.8  MCV 90.3 89.6 89.1 89.4  PLT 262 252 260 248   Cardiac Enzymes: No results for input(Christensen): "CKTOTAL", "CKMB",  "CKMBINDEX", "TROPONINI" in the last 168 hours. BNP: Invalid input(Christensen): "POCBNP" CBG: No results for input(Christensen): "GLUCAP" in the last 168 hours. D-Dimer No results for input(Christensen): "DDIMER" in the last 72 hours. Hgb  A1c No results for input(Christensen): "HGBA1C" in the last 72 hours. Lipid Profile No results for input(Christensen): "CHOL", "HDL", "LDLCALC", "TRIG", "CHOLHDL", "LDLDIRECT" in the last 72 hours. Thyroid function studies No results for input(Christensen): "TSH", "T4TOTAL", "T3FREE", "THYROIDAB" in the last 72 hours.  Invalid input(Christensen): "FREET3" Anemia work up No results for input(Christensen): "VITAMINB12", "FOLATE", "FERRITIN", "TIBC", "IRON", "RETICCTPCT" in the last 72 hours. Urinalysis    Component Value Date/Time   COLORURINE YELLOW (A) 02/08/2019 1158   APPEARANCEUR CLEAR (A) 02/08/2019 1158   APPEARANCEUR Clear 07/11/2012 0413   LABSPEC 1.009 02/08/2019 1158   LABSPEC 1.027 07/11/2012 0413   PHURINE 6.0 02/08/2019 1158   GLUCOSEU NEGATIVE 02/08/2019 1158   GLUCOSEU Negative 07/11/2012 0413   HGBUR NEGATIVE 02/08/2019 1158   BILIRUBINUR NEGATIVE 02/08/2019 1158   BILIRUBINUR negative 10/23/2016 0835   BILIRUBINUR Negative 07/11/2012 0413   KETONESUR NEGATIVE 02/08/2019 1158   PROTEINUR NEGATIVE 02/08/2019 1158   UROBILINOGEN 1.0 10/23/2016 0835   NITRITE NEGATIVE 02/08/2019 1158   LEUKOCYTESUR NEGATIVE 02/08/2019 1158   LEUKOCYTESUR Negative 07/11/2012 0413   Sepsis Labs Recent Labs  Lab 03/02/22 1022 03/03/22 0500 03/04/22 0604 03/05/22 0608  WBC 9.0 9.1 8.5 7.5   Microbiology No results found for this or any previous visit (from the past 240 hour(Christensen)). Imaging US Abdomen Limited RUQ (LIVER/GB)  Result Date: 03/03/2022 CLINICAL DATA:  Transaminitis EXAM: ULTRASOUND ABDOMEN LIMITED RIGHT UPPER QUADRANT COMPARISON:  CT abdomen/pelvis 07/11/2012, right upper quadrant ultrasound 11/21/2011 FINDINGS: Gallbladder: No gallstones or wall thickening visualized. No sonographic Murphy sign noted by  sonographer. Common bile duct: Diameter: 3 mm Liver: No focal lesion identified. Within normal limits in parenchymal echogenicity. Portal vein is patent on color Doppler imaging with normal direction of blood flow towards the liver. Other: There is a right pleural effusion. IMPRESSION: 1. Right pleural effusion. 2. Otherwise normal right upper quadrant ultrasound. Electronically Signed   By: Lesia Hausen M.D.   On: 03/03/2022 09:52   ECHOCARDIOGRAM COMPLETE  Result Date: 03/02/2022    ECHOCARDIOGRAM REPORT   Patient Name:   YEILYN GENT Date of Exam: 03/02/2022 Medical Rec #:  [id number]          Height:       68.0 in Accession #:    [id number]         Weight:       265.0 lb Date of Birth:  Jun 15, 1955           BSA:          2.303 m Patient Age:    66 years           BP:           123/93 mmHg Patient Gender: F                  HR:           81 bpm. Exam Location:  [facility] Procedure: 2D Echo Indications:     CHF I50.31  History:         Patient has prior history of Echocardiogram examinations, most                  recent 03/01/2020.  Sonographer:     Overton Mam RDCS Referring Phys:  [id number] AGBATA Diagnosing Phys: Julien Nordmann MD IMPRESSIONS  1. Left ventricular ejection fraction, by estimation, is 40 to 45%. The left ventricle has mildly decreased function. The left ventricle demonstrates global hypokinesis. There is mild left ventricular  hypertrophy. Left ventricular diastolic parameters are indeterminate.  2. Right ventricular systolic function is moderately reduced. The right ventricular size is mildly enlarged. There is mildly elevated pulmonary artery systolic pressure. The estimated right ventricular systolic pressure is 41.6 mmHg.  3. Left atrial size was moderately dilated.  4. The mitral valve is normal in structure. Mild to moderate mitral valve regurgitation. No evidence of mitral stenosis.  5. Tricuspid valve regurgitation is moderate to severe.  6. The aortic valve is normal in  structure. Aortic valve regurgitation is not visualized. Aortic valve sclerosis/calcification is present, without any evidence of aortic stenosis.  7. The inferior vena cava is dilated in size with <50% respiratory variability, suggesting right atrial pressure of 15 mmHg.  8. Rhythm is atrial fibrillation. FINDINGS  Left Ventricle: Left ventricular ejection fraction, by estimation, is 40 to 45%. The left ventricle has mildly decreased function. The left ventricle demonstrates global hypokinesis. The left ventricular internal cavity size was normal in size. There is  mild left ventricular hypertrophy. Left ventricular diastolic parameters are indeterminate. Right Ventricle: The right ventricular size is mildly enlarged. No increase in right ventricular wall thickness. Right ventricular systolic function is moderately reduced. There is mildly elevated pulmonary artery systolic pressure. The tricuspid regurgitant velocity is 2.58 m/Christensen, and with an assumed right atrial pressure of 15 mmHg, the estimated right ventricular systolic pressure is 41.6 mmHg. Left Atrium: Left atrial size was moderately dilated. Right Atrium: Right atrial size was normal in size. Pericardium: There is no evidence of pericardial effusion. Mitral Valve: The mitral valve is normal in structure. Mild to moderate mitral valve regurgitation. No evidence of mitral valve stenosis. Tricuspid Valve: The tricuspid valve is normal in structure. Tricuspid valve regurgitation is moderate to severe. No evidence of tricuspid stenosis. Aortic Valve: The aortic valve is normal in structure. Aortic valve regurgitation is not visualized. Aortic valve sclerosis/calcification is present, without any evidence of aortic stenosis. Aortic valve peak gradient measures 4.8 mmHg. Pulmonic Valve: The pulmonic valve was normal in structure. Pulmonic valve regurgitation is not visualized. No evidence of pulmonic stenosis. Aorta: The aortic root is normal in size and  structure. Venous: The inferior vena cava is dilated in size with less than 50% respiratory variability, suggesting right atrial pressure of 15 mmHg. IAS/Shunts: No atrial level shunt detected by color flow Doppler.  LEFT VENTRICLE PLAX 2D LVIDd:         4.60 cm     Diastology LVIDs:         3.40 cm     LV e' medial:    9.03 cm/Christensen LV PW:         1.30 cm     LV E/e' medial:  12.0 LV IVS:        1.20 cm     LV e' lateral:   14.70 cm/Christensen LVOT diam:     1.90 cm     LV E/e' lateral: 7.3 LV SV:         36 LV SV Index:   16 LVOT Area:     2.84 cm  LV Volumes (MOD) LV vol d, MOD A4C: 61.9 ml LV vol Christensen, MOD A4C: 29.0 ml LV SV MOD A4C:     61.9 ml RIGHT VENTRICLE RV Basal diam:  4.20 cm RV Christensen prime:     10.00 cm/Christensen TAPSE (M-mode): 1.5 cm LEFT ATRIUM             Index  RIGHT ATRIUM           Index LA diam:        5.20 cm 2.26 cm/m   RA Area:     15.60 cm LA Vol (A2C):   62.9 ml 27.31 ml/m  RA Volume:   38.60 ml  16.76 ml/m LA Vol (A4C):   91.7 ml 39.81 ml/m LA Biplane Vol: 80.9 ml 35.12 ml/m  AORTIC VALVE                 PULMONIC VALVE AV Area (Vmax): 2.08 cm     PV Vmax:       0.88 m/Christensen AV Vmax:        109.00 cm/Christensen  PV Peak grad:  3.1 mmHg AV Peak Grad:   4.8 mmHg LVOT Vmax:      79.80 cm/Christensen LVOT Vmean:     51.700 cm/Christensen LVOT VTI:       0.128 m  AORTA Ao Root diam: 3.20 cm MITRAL VALVE                TRICUSPID VALVE MV Area (PHT): 4.93 cm     TR Peak grad:   26.6 mmHg MV Decel Time: 154 msec     TR Vmax:        258.00 cm/Christensen MV E velocity: 108.00 cm/Christensen                             SHUNTS                             Systemic VTI:  0.13 m                             Systemic Diam: 1.90 cm Julien Nordmann MD Electronically signed by Julien Nordmann MD Signature Date/Time: 03/02/2022/3:53:22 PM    Final    DG Chest Portable 1 View  Result Date: 03/02/2022 CLINICAL DATA:  Short of breath. Symptoms concerning for atrial fibrillation beginning 5 days ago. EXAM: PORTABLE CHEST 1 VIEW COMPARISON:  02/07/2019. FINDINGS: Cardiac  silhouette is mildly enlarged. No mediastinal or hilar masses. Mild opacity at the lateral right lung base, likely scarring or chronic atelectasis. Mild chronic thickening of the minor fissure. These findings are stable from the most recent prior study. Remainder of the lungs is clear. No convincing pleural effusion and no pneumothorax. Skeletal structures are grossly intact. IMPRESSION: No active disease. Electronically Signed   By: Amie Portland M.D.   On: 03/02/2022 10:39      Time coordinating discharge: over 30 minutes  SIGNED:  Sunnie Nielsen DO [facility]

## 2022-03-06 NOTE — Progress Notes (Signed)
Walked patient around unit.  Patient HR stayed around 85 to 87 bpm.  Patient felt slightly winded but otherwise tolerated well.    Discussed discharge information with patient, including follow up appointments and medications.   Sent home AVS paperwork with patient.

## 2022-03-07 ENCOUNTER — Encounter: Payer: Self-pay | Admitting: Cardiology

## 2022-03-07 ENCOUNTER — Ambulatory Visit: Payer: Medicare HMO | Attending: Cardiology | Admitting: Cardiology

## 2022-03-07 ENCOUNTER — Telehealth: Payer: Self-pay

## 2022-03-07 VITALS — BP 130/80 | HR 69 | Ht 68.0 in | Wt 264.5 lb

## 2022-03-07 DIAGNOSIS — Z6841 Body Mass Index (BMI) 40.0 and over, adult: Secondary | ICD-10-CM | POA: Diagnosis not present

## 2022-03-07 DIAGNOSIS — G4733 Obstructive sleep apnea (adult) (pediatric): Secondary | ICD-10-CM

## 2022-03-07 DIAGNOSIS — I5020 Unspecified systolic (congestive) heart failure: Secondary | ICD-10-CM | POA: Diagnosis not present

## 2022-03-07 DIAGNOSIS — I10 Essential (primary) hypertension: Secondary | ICD-10-CM | POA: Diagnosis not present

## 2022-03-07 DIAGNOSIS — I480 Paroxysmal atrial fibrillation: Secondary | ICD-10-CM | POA: Diagnosis not present

## 2022-03-07 DIAGNOSIS — Z7901 Long term (current) use of anticoagulants: Secondary | ICD-10-CM | POA: Diagnosis not present

## 2022-03-07 NOTE — Progress Notes (Unsigned)
Name: Regina Christensen   MRN: [id number]    DOB: [birth year]   Date:03/10/2022       Progress Note  Subjective  Chief Complaint  Hospital Follow Up  HPI  Admitted: 03/02/22 Discharged: 03/06/22  She was not feeling well for a couple of weeks prior to going to [facility] on 11/12, she had noticed SOB, orthopnea, lower extremity edema, also intermittent episodes of palpitation. She was found to be in atrial fibrillation with RVR. Labs were unremarkable , cardiologist was consulted. She was diagnosed with acute on chronic CHF plus Afib with RVR and after multiple attempts to cardiovert with medication she  had cardioversion done and responded well. She will have atrial node ablation in Feb. Currently compliant with medications and denies side effects.   Since she has been home she feels much better but still has SOB and palpitation with activity, still has some lower extremity edema but improving since discharge Orthopnea is back to her baseline   Medication reconciliation done   She is on higher dose olmasartan , higher dose of furosemide , also taking cardizem on top of metoprolol 150 mg BID   Patient Active Problem List   Diagnosis Date Noted   Chronic diastolic heart failure (HCC) 03/06/2022   Obesity (BMI 30-39.9) 03/04/2022   HFrEF (heart failure with reduced ejection fraction) (HCC) 03/03/2022   Primary hypertension 03/03/2022   Atrial fibrillation with RVR (HCC) 03/02/2022   Transaminitis 03/02/2022   Dilated cardiomyopathy (HCC) 03/02/2022   Nonrheumatic tricuspid valve regurgitation 03/02/2022   Osteopenia of necks of both femurs 01/22/2022   Bilateral lower extremity edema 09/30/2021   Acute on chronic combined systolic and diastolic CHF (congestive heart failure) (HCC) 09/10/2021   History of total bilateral knee replacement 09/10/2021   History of blood transfusion 09/10/2020   Current use of long term anticoagulation    Diabetes mellitus (HCC)    Paroxysmal atrial  fibrillation (HCC)    GERD without esophagitis    Depression    Vitamin D deficiency 04/12/2020   History of CVA (cerebrovascular accident) without residual deficits 03/05/2020   History of hysterectomy 07/19/2019   Anxiety 07/19/2019   MDD (major depressive disorder), recurrent, in full remission (HCC) 07/19/2019   Insomnia due to mental condition 07/19/2019   Bilateral carpal tunnel syndrome 07/30/2016   OSA (obstructive sleep apnea) 03/07/2016   BPPV (benign paroxysmal positional vertigo) 08/28/2015   Hypertension associated with type 2 diabetes mellitus (HCC) 11/09/2014   GAD (generalized anxiety disorder) 11/09/2014   Acanthosis nigricans 11/09/2014   Morbid obesity (HCC) 01/06/2012    Past Surgical History:  Procedure Laterality Date   ABDOMINAL HYSTERECTOMY     BREAST EXCISIONAL BIOPSY Left    CARDIOVERSION N/A 03/05/2022   Procedure: CARDIOVERSION;  Surgeon: Debbe Odea, MD;  Location: [facility] ORS;  Service: Cardiovascular;  Laterality: N/A;   ENDOMETRIAL ABLATION     x2   ENDOSCOPIC CONCHA BULLOSA RESECTION Bilateral 08/08/2020   Procedure: ENDOSCOPIC CONCHA BULLOSA RESECTION;  Surgeon: Vernie Murders, MD;  Location: [facility];  Service: ENT;  Laterality: Bilateral;   ETHMOIDECTOMY Right 08/08/2020   Procedure: ETHMOIDECTOMY, LEFT PARTIAL ETHMOIDECTOMY;  Surgeon: Vernie Murders, MD;  Location: [facility];  Service: ENT;  Laterality: Right;   IMAGE GUIDED SINUS SURGERY N/A 08/08/2020   Procedure: IMAGE GUIDED SINUS SURGERY, RIGHT FRONTAL SINUSOTOMY;  Surgeon: Vernie Murders, MD;  Location: [facility];  Service: ENT;  Laterality: N/A;   LAPAROSCOPIC SLEEVE GASTRECTOMY     MAXILLARY ANTROSTOMY Bilateral 08/08/2020  Procedure: MAXILLARY ANTROSTOMY with tissue;  Surgeon: Vernie Murders, MD;  Location: [facility];  Service: ENT;  Laterality: Bilateral;   NASAL TURBINATE REDUCTION  08/27/2020   Procedure: TURBINATE REDUCTION /SUBMUCOSAL DEBRIDEMENT;  Surgeon: Vernie Murders, MD;   Location: [facility];  Service: ENT;;   TOTAL KNEE ARTHROPLASTY Right 06/2019   TOTAL KNEE ARTHROPLASTY Left 01/31/2021   [facility]    Family History  Problem Relation Age of Onset   Diabetes Mother    High blood pressure Mother    Heart disease Father    Cancer Brother    Mental illness Neg Hx     Social History   Tobacco Use   Smoking status: Never   Smokeless tobacco: Never  Substance Use Topics   Alcohol use: No    Alcohol/week: 0.0 standard drinks of alcohol    Comment: rare     Current Outpatient Medications:    acetaminophen (TYLENOL) 650 MG CR tablet, Take 1 tablet (650 mg total) by mouth every 8 (eight) hours as needed for pain., Disp: , Rfl:    apixaban (ELIQUIS) 5 MG TABS tablet, Take 1 tablet (5 mg total) by mouth 2 (two) times daily., Disp: 180 tablet, Rfl: 3   atorvastatin (LIPITOR) 10 MG tablet, TAKE 1 TABLET BY MOUTH EVERY DAY, Disp: 90 tablet, Rfl: 3   diltiazem (CARDIZEM CD) 180 MG 24 hr capsule, Take 1 capsule (180 mg total) by mouth daily., Disp: 30 capsule, Rfl: 0   diltiazem (DILACOR XR) 180 MG 24 hr capsule, Take 180 mg by mouth daily., Disp: , Rfl:    famotidine (PEPCID) 20 MG tablet, Take 1 tablet (20 mg total) by mouth daily as needed for indigestion., Disp: 90 tablet, Rfl: 1   furosemide (LASIX) 40 MG tablet, Take 1 tablet (40 mg total) by mouth daily., Disp: 30 tablet, Rfl: 0   gabapentin (NEURONTIN) 100 MG capsule, Take 100 mg by mouth at bedtime., Disp: , Rfl:    hydrOXYzine (ATARAX) 25 MG tablet, TAKE 0.5-1 TABLETS (12.5-25 MG TOTAL) BY MOUTH AT BEDTIME AS NEEDED. FOR SLEEP, Disp: 90 tablet, Rfl: 1   meclizine (ANTIVERT) 12.5 MG tablet, Take 1 tablet (12.5 mg total) by mouth 3 (three) times daily as needed for dizziness., Disp: 30 tablet, Rfl: 0   metoprolol tartrate 75 MG TABS, Take 150 mg by mouth 2 (two) times daily., Disp: 120 tablet, Rfl: 0   Multiple Vitamin (MULTIVITAMIN) tablet, Take 1 tablet by mouth daily.  BariatricPal Multivitamin, Disp: , Rfl:    olmesartan (BENICAR) 20 MG tablet, Take 1 tablet (20 mg total) by mouth daily., Disp: 30 tablet, Rfl: 0   potassium chloride (KLOR-CON M) 10 MEQ tablet, Take 2 tablets (20 mEq total) by mouth daily., Disp: 60 tablet, Rfl: 0   Semaglutide, 2 MG/DOSE, (OZEMPIC, 2 MG/DOSE,) 8 MG/3ML SOPN, Inject 2 mg into the skin once a week., Disp: 3 mL, Rfl: 0   triamcinolone (NASACORT) 55 MCG/ACT AERO nasal inhaler, Place 2 sprays into the nose daily., Disp: , Rfl:    venlafaxine XR (EFFEXOR-XR) 37.5 MG 24 hr capsule, Take 1 capsule (37.5 mg total) by mouth daily with breakfast. Take along with 75 mg daily, Disp: 90 capsule, Rfl: 1   venlafaxine XR (EFFEXOR-XR) 75 MG 24 hr capsule, Take 1 capsule (75 mg total) by mouth daily with breakfast. Take along with 37.5 mg daily, Disp: 90 capsule, Rfl: 1   Vitamin D, Ergocalciferol, (DRISDOL) 1.25 MG (50000 UNIT) CAPS capsule, Take 1 capsule (  50,000 Units total) by mouth every 7 (seven) days., Disp: 12 capsule, Rfl: 1   XIIDRA 5 % SOLN, Place 1 drop into both eyes 2 (two) times daily., Disp: , Rfl: 4   benzonatate (TESSALON) 100 MG capsule, Take 2 capsules (200 mg total) by mouth 2 (two) times daily as needed for cough. (Patient not taking: Reported on 03/07/2022), Disp: 20 capsule, Rfl: 0  Allergies  Allergen Reactions   Hydrocodone-Acetaminophen Other (See Comments)    Extreme headache   Ace Inhibitors Cough   Hctz [Hydrochlorothiazide] Rash    face    I personally reviewed active problem list, medication list, allergies, family history, social history, health maintenance with the patient/caregiver today.   ROS  Ten systems reviewed and is negative except as mentioned in HPI   Objective  Vitals:   03/10/22 0751  BP: 132/74  Pulse: 80  Resp: 16  SpO2: 98%  Weight: 260 lb (117.9 kg)  Height: 5\' 8"  (1.727 m)    Body mass index is 39.53 kg/m.  Physical Exam  Constitutional: Patient appears well-developed  and well-nourished. Obese  No distress.  HEENT: head atraumatic, normocephalic, pupils equal and reactive to light, neck supple Cardiovascular: Normal rate, regular rhythm , 2/6 SEM murmur heard. Trace BLE edema. Pulmonary/Chest: Effort normal and breath sounds normal. No respiratory distress. Abdominal: Soft.  There is no tenderness. Psychiatric: Patient has a normal mood and affect. behavior is normal. Judgment and thought content normal.    PHQ2/9:    03/10/2022    7:51 AM 02/12/2022   10:13 AM 02/10/2022   11:50 AM 10/18/2021    8:04 AM 09/27/2021    8:41 AM  Depression screen PHQ 2/9  Decreased Interest 0 0 0 0   Down, Depressed, Hopeless 0 0 0 0   PHQ - 2 Score 0 0 0 0   Altered sleeping 0  1 0   Tired, decreased energy 0  2 0   Change in appetite 0  0 0   Feeling bad or failure about yourself  0  0 0   Trouble concentrating 0  0 0   Moving slowly or fidgety/restless 0  0 0   Suicidal thoughts 0  0 0   PHQ-9 Score 0  3 0   Difficult doing work/chores   Not difficult at all       Information is confidential and restricted. Go to Review Flowsheets to unlock data.    phq 9 is negative   Fall Risk:    03/10/2022    7:51 AM 02/12/2022   10:12 AM 02/10/2022   11:50 AM 10/18/2021    8:04 AM 09/10/2021    7:44 AM  Fall Risk   Falls in the past year? 0 0 0 0 0  Number falls in past yr: 0 0  0 0  Injury with Fall? 0 0  0 0  Risk for fall due to : No Fall Risks   No Fall Risks No Fall Risks  Follow up Falls prevention discussed Falls evaluation completed Falls prevention discussed;Education provided;Falls evaluation completed Falls prevention discussed Falls prevention discussed      Functional Status Survey: Is the patient deaf or have difficulty hearing?: No Does the patient have difficulty seeing, even when wearing glasses/contacts?: No Does the patient have difficulty concentrating, remembering, or making decisions?: No Does the patient have difficulty walking or  climbing stairs?: Yes Does the patient have difficulty dressing or bathing?: No Does the patient have difficulty doing  errands alone such as visiting a doctor's office or shopping?: No    Assessment & Plan  1. Hospital discharge follow-up  - BASIC METABOLIC PANEL WITH GFR - CBC with Differential/Platelet  2. Atrial fibrillation with RVR (HCC)  Rate controlled - currently she is sinus rhythm   3. Acute on chronic combined systolic and diastolic CHF (congestive heart failure) (HCC)   Doing better Reviewed DASH diet

## 2022-03-07 NOTE — Patient Instructions (Signed)
Medication Instructions:  No changes at this time.  *If you need a refill on your cardiac medications before your next appointment, please call your pharmacy*   Lab Work: None  If you have labs (blood work) drawn today and your tests are completely normal, you will receive your results only by: MyChart Message (if you have MyChart) OR A paper copy in the mail If you have any lab test that is abnormal or we need to change your treatment, we will call you to review the results.   Testing/Procedures: None   Follow-Up: At [facility], you and your health needs are our priority.  As part of our continuing mission to provide you with exceptional heart care, we have created designated Provider Care Teams.  These Care Teams include your primary Cardiologist (physician) and Advanced Practice Providers (APPs -  Physician Assistants and Nurse Practitioners) who all work together to provide you with the care you need, when you need it.     Your next appointment:   Keep scheduled appointments. 03/18/22 at 08:30 am with Regina Kindred NP 04/04/22 at 08:00 am with Regina Christensen  The format for your next appointment:   In Person      Important Information About Sugar

## 2022-03-07 NOTE — Telephone Encounter (Signed)
Transition Care Management Follow-up Telephone Call Date of discharge and from where: [place] 03/06/2022 How have you been since you were released from the hospital? better Any questions or concerns? No  Items Reviewed: Did the pt receive and understand the discharge instructions provided? Yes  Medications obtained and verified? Yes  Other? No  Any new allergies since your discharge? No  Dietary orders reviewed? Yes Do you have support at home? Yes   Home Care and Equipment/Supplies: Were home health services ordered? no If so, what is the name of the agency? N/a  Has the agency set up a time to come to the patient's home? not applicable Were any new equipment or medical supplies ordered?  No What is the name of the medical supply agency? N/a Were you able to get the supplies/equipment? not applicable Do you have any questions related to the use of the equipment or supplies? No  Functional Questionnaire: (I = Independent and D = Dependent) ADLs: I  Bathing/Dressing- I  Meal Prep- I  Eating- I  Maintaining continence- I  Transferring/Ambulation- I  Managing Meds- I  Follow up appointments reviewed:  PCP Hospital f/u appt confirmed? Yes  Scheduled to see DR Carlynn Purl on 03/10/2022 @ 7:40. Specialist Hospital f/u appt confirmed? No   Are transportation arrangements needed? No  If their condition worsens, is the pt aware to call PCP or go to the Emergency Dept.? Yes Was the patient provided with contact information for the PCP's office or ED? Yes Was to pt encouraged to call back with questions or concerns YES Karena Addison, LPN [facility] Nurse Health Advisor Direct Dial [phone]

## 2022-03-07 NOTE — Progress Notes (Signed)
Cardiology Clinic Note   Patient Name: Regina Christensen Date of Encounter: 03/07/2022  Primary Care Provider:  [name], MD Primary Cardiologist:  Regina Odea, MD  Patient Profile    66 year old female with a history of hypertension, paroxysmal atrial fibrillation on chronic apixaban, CVA 02/2020, obesity, and sleep apnea who presents today for hospital follow-up after recent cardioversion.  Past Medical History    Past Medical History:  Diagnosis Date   Anxiety    Arthritis    Back pain    BPPV (benign paroxysmal positional vertigo)    Chronic diastolic HF (heart failure) (HCC)    Complication of anesthesia    Post-operative hypoxia requiring supplemental oxygen   Current use of long term anticoagulation    Apixaban   CVA (cerebral vascular accident) (HCC) 02/28/2020   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; imaging from [facility]   Depression    Edema of both lower extremities    GERD (gastroesophageal reflux disease)    Hypertension    IBS (irritable bowel syndrome)    Lactose intolerance    Morbid obesity with BMI of 45.0-49.9, adult (HCC)    Obesity    Osteoarthritis    PAF (paroxysmal atrial fibrillation) (HCC)    Pre-diabetes    Sleep apnea    uses CPAP machine sometimes    Type 2 diabetes mellitus without complication (HCC)    Vitamin D deficiency    Past Surgical History:  Procedure Laterality Date   ABDOMINAL HYSTERECTOMY     BREAST EXCISIONAL BIOPSY Left    CARDIOVERSION N/A 03/05/2022   Procedure: CARDIOVERSION;  Surgeon: Regina Odea, MD;  Location: [facility];  Service: Cardiovascular;  Laterality: N/A;   ENDOMETRIAL ABLATION     x2   ENDOSCOPIC CONCHA BULLOSA RESECTION Bilateral 08/08/2020   Procedure: ENDOSCOPIC CONCHA BULLOSA RESECTION;  Surgeon: Regina Murders, MD;  Location: [facility];  Service: ENT;  Laterality: Bilateral;   ETHMOIDECTOMY Right 08/08/2020   Procedure: ETHMOIDECTOMY, LEFT PARTIAL ETHMOIDECTOMY;  Surgeon:  Regina Murders, MD;  Location: [facility];  Service: ENT;  Laterality: Right;   IMAGE GUIDED SINUS SURGERY N/A 08/08/2020   Procedure: IMAGE GUIDED SINUS SURGERY, RIGHT FRONTAL SINUSOTOMY;  Surgeon: Regina Murders, MD;  Location: [facility];  Service: ENT;  Laterality: N/A;   LAPAROSCOPIC SLEEVE GASTRECTOMY     MAXILLARY ANTROSTOMY Bilateral 08/08/2020   Procedure: MAXILLARY ANTROSTOMY with tissue;  Surgeon: Regina Murders, MD;  Location: [facility];  Service: ENT;  Laterality: Bilateral;   NASAL TURBINATE REDUCTION  08/27/2020   Procedure: TURBINATE REDUCTION /SUBMUCOSAL DEBRIDEMENT;  Surgeon: Regina Murders, MD;  Location: [facility];  Service: ENT;;   TOTAL KNEE ARTHROPLASTY Right 06/2019   TOTAL KNEE ARTHROPLASTY Left 01/31/2021   [facility]    Allergies  Allergies  Allergen Reactions   Hydrocodone-Acetaminophen Other (See Comments)    Extreme headache   Ace Inhibitors Cough   Hctz [Hydrochlorothiazide] Rash    face    History of Present Illness    Regina Christensen is a 66 year old female with a history of hypertension, paroxysmal atrial fibrillation on chronic apixaban, CVA 02/2020, obesity, and sleep apnea is noncompliant with CPAP.  She was last seen in clinic on 02/26/2022 with complaints of worsening Regina Christensen, fatigue, shortness of breath, and fluid retention.  At that time she had not taken any of her furosemide.  She stated that her weight was up approximately 10 pounds from her previous visit and her heart rate and  blood pressure had been elevated due to anxiety.  She had previously been scheduled for an atrial fibrillation ablation in February with Dr. Lalla Christensen but stated she was suffering from the symptoms with IV able to wait until February.  During her visit her metoprolol was increased 150 mg twice daily and she was to take her furosemide on a daily basis.  She presented to the [facility] emergency department on 03/02/2022 with complaints of symptomatic palpitations.   She had had the persistent feeling that her heart been racing.  She continued to deny chest pain but states she gets very out of breath with any exertion.  She stated that the prior 24 hours she had noticed she cannot lie flat on her back or on her left side due to difficulty breathing.  She had noticed increased swelling to her bilateral lower extremities.  Initial vitals were blood pressure 142/87, heart rate of 142, respirations of 19, temperature of 98.5.  Labs revealed blood glucose of 110, calcium 8.6, albumin of 3.4, AST 48, ALT of 75, BN P of 577.9, high-sensitivity troponins were 5 and 6, TSH 1.359.  She was continued on metoprolol and apixaban was started on a diltiazem drip that was initiated in the emergency department.  She was then started on oral dosing of diltiazem with the diltiazem drip being weaned off and consolidated prior to discharge.  Since she had been compliant with her apixaban therapy and remains symptomatic into the atrial fibrillation she did undergo direct-current cardioversion by Dr. Azucena Christensen.  She was shocked 1 time at 200 J and converted to normal sinus rhythm.  She remained in a sinus rhythm from the time of the cardioversion on 03/05/2022 3 discharge on 03/06/2022.  She returns to clinic today for posthospital visit.  She states that she still continues to have a dyspnea on exertion and fatigue that is improving but denies any palpitations.  She states that she is still concerned about the weight gain that she had but is slowly losing weight she is down 5 pounds since her arrival to the hospital.  She denies any chest pain, dizziness, palpitations, or syncope/near syncope.  She states that she continues to be compliant with her medications.  Home Medications    Current Outpatient Medications  Medication Sig Dispense Refill   acetaminophen (TYLENOL) 650 MG CR tablet Take 1 tablet (650 mg total) by mouth every 8 (eight) hours as needed for pain.     apixaban (ELIQUIS) 5  MG TABS tablet Take 1 tablet (5 mg total) by mouth 2 (two) times daily. 180 tablet 3   atorvastatin (LIPITOR) 10 MG tablet Take 1 tablet (10 mg total) by mouth daily. 90 tablet 1   benzonatate (TESSALON) 100 MG capsule Take 2 capsules (200 mg total) by mouth 2 (two) times daily as needed for cough. (Patient not taking: Reported on 03/07/2022) 20 capsule 0   diltiazem (CARDIZEM CD) 180 MG 24 hr capsule Take 1 capsule (180 mg total) by mouth daily. 30 capsule 0   famotidine (PEPCID) 20 MG tablet Take 1 tablet (20 mg total) by mouth daily as needed for indigestion. 90 tablet 1   furosemide (LASIX) 40 MG tablet Take 1 tablet (40 mg total) by mouth daily. 30 tablet 0   gabapentin (NEURONTIN) 100 MG capsule Take 100 mg by mouth at bedtime.     hydrOXYzine (ATARAX) 25 MG tablet TAKE 0.5-1 TABLETS (12.5-25 MG TOTAL) BY MOUTH AT BEDTIME AS NEEDED. FOR SLEEP 90 tablet 1  meclizine (ANTIVERT) 12.5 MG tablet Take 1 tablet (12.5 mg total) by mouth 3 (three) times daily as needed for dizziness. 30 tablet 0   metoprolol tartrate 75 MG TABS Take 150 mg by mouth 2 (two) times daily. 120 tablet 0   Multiple Vitamin (MULTIVITAMIN) tablet Take 1 tablet by mouth daily. BariatricPal Multivitamin     olmesartan (BENICAR) 20 MG tablet Take 1 tablet (20 mg total) by mouth daily. 30 tablet 0   potassium chloride (KLOR-CON M) 10 MEQ tablet Take 2 tablets (20 mEq total) by mouth daily. 60 tablet 0   Semaglutide, 2 MG/DOSE, (OZEMPIC, 2 MG/DOSE,) 8 MG/3ML SOPN Inject 2 mg into the skin once a week. 3 mL 0   triamcinolone (NASACORT) 55 MCG/ACT AERO nasal inhaler Place 2 sprays into the nose daily.     venlafaxine XR (EFFEXOR-XR) 37.5 MG 24 hr capsule Take 1 capsule (37.5 mg total) by mouth daily with breakfast. Take along with 75 mg daily 90 capsule 1   venlafaxine XR (EFFEXOR-XR) 75 MG 24 hr capsule Take 1 capsule (75 mg total) by mouth daily with breakfast. Take along with 37.5 mg daily 90 capsule 1   Vitamin D,  Ergocalciferol, (DRISDOL) 1.25 MG (50000 UNIT) CAPS capsule Take 1 capsule (50,000 Units total) by mouth every 7 (seven) days. 12 capsule 1   diltiazem (DILACOR XR) 180 MG 24 hr capsule Take 180 mg by mouth daily.     XIIDRA 5 % SOLN Place 1 drop into both eyes 2 (two) times daily.  4   No current facility-administered medications for this visit.     Family History    Family History  Problem Relation Age of Onset   Diabetes Mother    High blood pressure Mother    Heart disease Father    Cancer Brother    Mental illness Neg Hx    She indicated that her mother is deceased. She indicated that her father is deceased. She indicated that her sister is alive. She indicated that her brother is deceased. She indicated that her daughter is alive. She indicated that her son is alive. She indicated that the status of her neg hx is unknown.  Social History    Social History   Socioeconomic History   Marital status: Divorced    Spouse name: Not on file   Number of children: 2   Years of education: Not on file   Highest education level: Associate degree: occupational, Scientist, product/process development, or vocational program  Occupational History   Occupation: Retired/ work PT    Employer: [employer]  Tobacco Use   Smoking status: Never   Smokeless tobacco: Never  Vaping Use   Vaping Use: Never used  Substance and Sexual Activity   Alcohol use: No    Alcohol/week: 0.0 standard drinks of alcohol    Comment: rare   Drug use: No   Sexual activity: Not on file  Other Topics Concern   Not on file  Social History Narrative   Not on file   Social Determinants of Health   Financial Resource Strain: High Risk (01/22/2022)   Overall Financial Resource Strain (CARDIA)    Difficulty of Paying Living Expenses: Very hard  Food Insecurity: No Food Insecurity (03/02/2022)   Hunger Vital Sign    Worried About Running Out of Food in the Last Year: Never true    Ran Out of Food in the Last Year: Never true  Transportation  Needs: No Transportation Needs (03/02/2022)   PRAPARE - Transportation  Lack of Transportation (Medical): No    Lack of Transportation (Non-Medical): No  Physical Activity: Insufficiently Active (10/18/2021)   Exercise Vital Sign    Days of Exercise per Week: 3 days    Minutes of Exercise per Session: 10 min  Stress: No Stress Concern Present (10/18/2021)   [employer] of Occupational Health - Occupational Stress Questionnaire    Feeling of Stress : Only a little  Social Connections: Moderately Integrated (10/18/2021)   Social Connection and Isolation Panel [NHANES]    Frequency of Communication with Friends and Family: More than three times a week    Frequency of Social Gatherings with Friends and Family: More than three times a week    Attends Religious Services: More than 4 times per year    Active Member of [employer] or Organizations: Yes    Attends Banker Meetings: Never    Marital Status: Divorced  Catering manager Violence: Not At Risk (03/02/2022)   Humiliation, Afraid, Rape, and Kick questionnaire    Fear of Current or Ex-Partner: No    Emotionally Abused: No    Physically Abused: No    Sexually Abused: No     Review of Systems    General:  No chills, fever, night sweats or weight changes.  Endorses fatigue Cardiovascular:  No chest pain, endorses dyspnea on exertion, endorses edema, orthopnea, palpitations, paroxysmal nocturnal dyspnea. Dermatological: No rash, lesions/masses Respiratory: No cough, endorses dyspnea Urologic: No hematuria, dysuria Abdominal:   No nausea, vomiting, diarrhea, bright red blood per rectum, melena, or hematemesis Neurologic:  No visual changes, wkns, changes in mental status.  Endorses generalized weakness All other systems reviewed and are otherwise negative except as noted above.   Physical Exam    VS:  BP 130/80 (BP Location: Left Arm, Patient Position: Sitting, Cuff Size: Large)   Pulse 69   Ht  (1.727 m)   Wt  264 lb 8 oz (120 kg)   SpO2 93%   BMI 40.22 kg/m  , BMI Body mass index is 40.22 kg/m.     GEN: Well nourished, well developed, in no acute distress. HEENT: normal. Neck: Supple, no JVD, carotid bruits, or masses. Cardiac: RRR, no murmurs, rubs, or gallops. No clubbing, cyanosis, trace pretibial edema.  Radials/DP/PT 2+ and equal bilaterally.  Respiratory:  Respirations regular and unlabored, clear to auscultation bilaterally. GI: Soft, nontender, nondistended, obese, BS + x 4. MS: no deformity or atrophy. Skin: warm and dry, no rash. Neuro:  Strength and sensation are intact. Psych: Normal affect.  Accessory Clinical Findings    ECG personally reviewed by me today-sinus rhythm with a rate of 69, PACs, left atrial enlargement, left ventricular hypertrophy, and left axis deviation- No acute changes  Lab Results  Component Value Date   WBC 7.5 03/05/2022   HGB 12.2 03/05/2022   HCT 37.8 03/05/2022   MCV 89.4 03/05/2022   PLT 248 03/05/2022   Lab Results  Component Value Date   CREATININE 0.85 03/05/2022   BUN 18 03/05/2022   NA 142 03/05/2022   K 4.0 03/05/2022   CL 110 03/05/2022   CO2 25 03/05/2022   Lab Results  Component Value Date   ALT 75 (H) 03/02/2022   AST 48 (H) 03/02/2022   ALKPHOS 83 03/02/2022   BILITOT 1.1 03/02/2022   Lab Results  Component Value Date   CHOL 112 02/10/2022   HDL 66 02/10/2022   LDLCALC 30 02/10/2022   TRIG 79 02/10/2022   CHOLHDL 1.7  02/10/2022    Lab Results  Component Value Date   HGBA1C 6.0 (H) 02/10/2022    Assessment & Plan   1.  Persistent atrial fibrillation RVR status post DCCV on 03/05/2022.  Patient continues to remain in sinus rhythm today on exam.  She has been continued on diltiazem 180 mg daily and metoprolol to tartrate 150 mg twice daily.  She is also being continued on apixaban 5 mg daily.  If her A-fib returns we will likely have to consider antiarrhythmics.  She is also with scheduled for the atrial  fibrillation ablation with Dr. Lalla BrothersLambert in February.  She has been encouraged to continue to take all medications as prescribed.  We also discussed triggers for atrial fibrillation being caffeine, anemia, fatigue, and stress.  2.  Cardiomyopathy with an LVEF of 45% found on echocardiogram during her hospitalization.  Possibly tachycardia induced from her atrial fibrillation RVR.  She has been continued on her current medications as well as Lasix 40 mg daily.  She does have a follow-up coming up with heart failure clinic as well.  We did discuss with her being in sinus rhythm on her return to need to be scheduled for an echocardiogram to reevaluate her EF.   3.  Essential hypertension with blood pressure of 130/80 today.  She is continued on diltiazem, furosemide, metoprolol, and olmesartan.  She has been advised to continue monitoring her blood pressure at home.  4.  Morbid obesity her PCP has recently placed her on exam pick.  Continue to work on increasing activity and dietary changes.  5.  Obstructive sleep apnea where she reports that she is unable to tolerate the CPAP mask and has not been using.  Discussed possibility during her hospitalization that did not treating her obstructive sleep apnea very well could be a trigger for her atrial fibrillation.  She will likely need to revisit new mask or alternative therapies.  Should consider following back up with pulmonary.  6.  Disposition patient to return to clinic to see MD/APP in December she has follow-up with Dr. Myriam ForehandAgbor- Sandie AnoEtang.  Patient has been advised to keep that appointment and to notify us if she needs to be seen sooner.   Alicianna Litchford, NP 03/07/2022, 12:37 PM

## 2022-03-08 ENCOUNTER — Other Ambulatory Visit: Payer: Self-pay | Admitting: Family Medicine

## 2022-03-08 DIAGNOSIS — Z8673 Personal history of transient ischemic attack (TIA), and cerebral infarction without residual deficits: Secondary | ICD-10-CM

## 2022-03-10 ENCOUNTER — Other Ambulatory Visit: Payer: Self-pay | Admitting: Family Medicine

## 2022-03-10 ENCOUNTER — Ambulatory Visit (INDEPENDENT_AMBULATORY_CARE_PROVIDER_SITE_OTHER): Payer: Medicare HMO | Admitting: Family Medicine

## 2022-03-10 ENCOUNTER — Encounter: Payer: Self-pay | Admitting: Family Medicine

## 2022-03-10 VITALS — BP 132/74 | HR 80 | Resp 16 | Ht 68.0 in | Wt 260.0 lb

## 2022-03-10 DIAGNOSIS — Z09 Encounter for follow-up examination after completed treatment for conditions other than malignant neoplasm: Secondary | ICD-10-CM | POA: Diagnosis not present

## 2022-03-10 DIAGNOSIS — I5043 Acute on chronic combined systolic (congestive) and diastolic (congestive) heart failure: Secondary | ICD-10-CM | POA: Diagnosis not present

## 2022-03-10 DIAGNOSIS — I4891 Unspecified atrial fibrillation: Secondary | ICD-10-CM | POA: Diagnosis not present

## 2022-03-10 DIAGNOSIS — I5032 Chronic diastolic (congestive) heart failure: Secondary | ICD-10-CM

## 2022-03-10 DIAGNOSIS — E785 Hyperlipidemia, unspecified: Secondary | ICD-10-CM | POA: Diagnosis not present

## 2022-03-10 DIAGNOSIS — I480 Paroxysmal atrial fibrillation: Secondary | ICD-10-CM

## 2022-03-10 DIAGNOSIS — E119 Type 2 diabetes mellitus without complications: Secondary | ICD-10-CM

## 2022-03-10 NOTE — Anesthesia Postprocedure Evaluation (Signed)
Anesthesia Post Note  Patient: Regina Christensen  Procedure(s) Performed: CARDIOVERSION  Patient location during evaluation: PACU Anesthesia Type: General Level of consciousness: awake and alert Pain management: pain level controlled Vital Signs Assessment: post-procedure vital signs reviewed and stable Respiratory status: spontaneous breathing, nonlabored ventilation, respiratory function stable and patient connected to nasal cannula oxygen Cardiovascular status: blood pressure returned to baseline and stable Postop Assessment: no apparent nausea or vomiting Anesthetic complications: no   No notable events documented.   Last Vitals:  Vitals:   03/06/22 0547 03/06/22 0731  BP: (!) 153/84 119/62  Pulse: 71 67  Resp: 18 18  Temp: 36.5 C 36.6 C  SpO2: 93% 96%    Last Pain:  Vitals:   03/06/22 0900  TempSrc:   PainSc: 0-No pain                 Lenard Simmer

## 2022-03-11 ENCOUNTER — Encounter: Payer: Self-pay | Admitting: Cardiology

## 2022-03-11 ENCOUNTER — Other Ambulatory Visit: Payer: Self-pay | Admitting: Family Medicine

## 2022-03-11 DIAGNOSIS — Z1231 Encounter for screening mammogram for malignant neoplasm of breast: Secondary | ICD-10-CM

## 2022-03-11 LAB — CBC WITH DIFFERENTIAL/PLATELET
Absolute Monocytes: 533 cells/uL (ref 200–950)
Basophils Absolute: 50 cells/uL (ref 0–200)
Basophils Relative: 0.7 %
Eosinophils Absolute: 86 cells/uL (ref 15–500)
Eosinophils Relative: 1.2 %
HCT: 41.7 % (ref 35.0–45.0)
Hemoglobin: 14.1 g/dL (ref 11.7–15.5)
Lymphs Abs: 1087 cells/uL (ref 850–3900)
MCH: 29.7 pg (ref 27.0–33.0)
MCHC: 33.8 g/dL (ref 32.0–36.0)
MCV: 87.8 fL (ref 80.0–100.0)
MPV: 10.9 fL (ref 7.5–12.5)
Monocytes Relative: 7.4 %
Neutro Abs: 5443 cells/uL (ref 1500–7800)
Neutrophils Relative %: 75.6 %
Platelets: 260 10*3/uL (ref 140–400)
RBC: 4.75 10*6/uL (ref 3.80–5.10)
RDW: 14.4 % (ref 11.0–15.0)
Total Lymphocyte: 15.1 %
WBC: 7.2 10*3/uL (ref 3.8–10.8)

## 2022-03-11 LAB — BASIC METABOLIC PANEL WITH GFR
BUN: 14 mg/dL (ref 7–25)
CO2: 27 mmol/L (ref 20–32)
Calcium: 9.4 mg/dL (ref 8.6–10.4)
Chloride: 104 mmol/L (ref 98–110)
Creat: 0.66 mg/dL (ref 0.50–1.05)
Glucose, Bld: 96 mg/dL (ref 65–99)
Potassium: 4 mmol/L (ref 3.5–5.3)
Sodium: 140 mmol/L (ref 135–146)
eGFR: 97 mL/min/{1.73_m2} (ref >=60)

## 2022-03-17 ENCOUNTER — Encounter: Payer: Self-pay | Admitting: Family

## 2022-03-17 ENCOUNTER — Ambulatory Visit: Payer: Medicare HMO | Attending: Family | Admitting: Family

## 2022-03-17 ENCOUNTER — Telehealth: Payer: Self-pay

## 2022-03-17 VITALS — BP 152/82 | HR 77 | Resp 20 | Wt 260.0 lb

## 2022-03-17 DIAGNOSIS — I110 Hypertensive heart disease with heart failure: Secondary | ICD-10-CM | POA: Insufficient documentation

## 2022-03-17 DIAGNOSIS — K589 Irritable bowel syndrome without diarrhea: Secondary | ICD-10-CM | POA: Insufficient documentation

## 2022-03-17 DIAGNOSIS — R69 Illness, unspecified: Secondary | ICD-10-CM | POA: Diagnosis not present

## 2022-03-17 DIAGNOSIS — I10 Essential (primary) hypertension: Secondary | ICD-10-CM

## 2022-03-17 DIAGNOSIS — K219 Gastro-esophageal reflux disease without esophagitis: Secondary | ICD-10-CM | POA: Diagnosis not present

## 2022-03-17 DIAGNOSIS — F32A Depression, unspecified: Secondary | ICD-10-CM | POA: Diagnosis not present

## 2022-03-17 DIAGNOSIS — E119 Type 2 diabetes mellitus without complications: Secondary | ICD-10-CM | POA: Diagnosis not present

## 2022-03-17 DIAGNOSIS — F3342 Major depressive disorder, recurrent, in full remission: Secondary | ICD-10-CM

## 2022-03-17 DIAGNOSIS — F419 Anxiety disorder, unspecified: Secondary | ICD-10-CM | POA: Diagnosis not present

## 2022-03-17 DIAGNOSIS — I480 Paroxysmal atrial fibrillation: Secondary | ICD-10-CM

## 2022-03-17 DIAGNOSIS — F411 Generalized anxiety disorder: Secondary | ICD-10-CM

## 2022-03-17 DIAGNOSIS — G4730 Sleep apnea, unspecified: Secondary | ICD-10-CM | POA: Diagnosis not present

## 2022-03-17 DIAGNOSIS — I5020 Unspecified systolic (congestive) heart failure: Secondary | ICD-10-CM | POA: Diagnosis not present

## 2022-03-17 DIAGNOSIS — Z8673 Personal history of transient ischemic attack (TIA), and cerebral infarction without residual deficits: Secondary | ICD-10-CM | POA: Insufficient documentation

## 2022-03-17 DIAGNOSIS — I5042 Chronic combined systolic (congestive) and diastolic (congestive) heart failure: Secondary | ICD-10-CM | POA: Insufficient documentation

## 2022-03-17 MED ORDER — VENLAFAXINE HCL ER 75 MG PO CP24: 75.0000 mg | ORAL_CAPSULE | Freq: Every day | ORAL | 1 refills | Status: DC

## 2022-03-17 MED ORDER — VENLAFAXINE HCL ER 37.5 MG PO CP24: 37.5000 mg | ORAL_CAPSULE | Freq: Every day | ORAL | 1 refills | Status: DC

## 2022-03-17 NOTE — Telephone Encounter (Signed)
I have [facility]ine [facility]

## 2022-03-17 NOTE — Patient Instructions (Addendum)
Continued weighing daily and call for an overnight weight gain of 3 pounds or more or a weekly weight gain of more than 5 pounds.   If you have voicemail, please make sure your mailbox is cleaned out so that we may leave a message and please make sure to listen to any voicemails.    If you receive a satisfaction survey regarding the Heart Failure Clinic, please take the time to fill it out. This way we can continue to provide excellent care and make any changes that need to be made.

## 2022-03-17 NOTE — Telephone Encounter (Signed)
Pt needs refills on both venlafaxine     Disp Refills Start End   venlafaxine XR (EFFEXOR-XR) 75 MG 24 hr capsule 90 capsule 1 09/27/2021    Sig - Route: Take 1 capsule (75 mg total) by mouth daily with breakfast. Take along with 37.5 mg daily - Oral   Sent to pharmacy as: venlafaxine XR (EFFEXOR-XR) 75 MG 24 hr capsule   E-Prescribing Status: Receipt confirmed by pharmacy (09/27/2021  8:46 AM EDT)     Disp Refills Start End   venlafaxine XR (EFFEXOR-XR) 37.5 MG 24 hr capsule 90 capsule 1 09/27/2021    Sig - Route: Take 1 capsule (37.5 mg total) by mouth daily with breakfast. Take along with 75 mg daily - Oral   Sent to pharmacy as: venlafaxine XR (EFFEXOR-XR) 37.5 MG 24 hr capsule   E-Prescribing Status: Receipt confirmed by pharmacy (09/27/2021  8:46 AM EDT)

## 2022-03-17 NOTE — Progress Notes (Signed)
Patient ID: Regina Christensen, female    DOB: 1956/03/02, 66 y.o.   MRN: [id number]  HPI  Ms Regina Christensen is a 66 y/o female with a history of DM, HTN, stroke, back pain, anxiety, depression, GERD, IBS, PAF, sleep apnea and chronic heart failure.   Echo report from 03/02/22 reviewed and showed an EF of 40-45% along with mild LVH, moderate LAE, mildy elevated PA pressure of 41.6 mmHg, mild/moderate MR and moderate/  severe TR.   Admitted 03/02/22 due to palpitations and SOB and found to be in AF RVR.Cardiology consult obtained. Cardizem gtt started. Cardioversion completed. Hypokalemia corrected.     Discharged after 4 days.   She presents today for her initial visit with a chief complaint of moderate fatigue with minimal exertion. Describes this as chronic in nature although does fluctuate on severity. She has associated shortness of breath, sporadic wheezing, pedal edema, palpitations and leg weakness along with this. She denies any difficulty sleeping, dizziness, abdominal distention, chest pain, cough or weight gain.   Scheduled for ablation early February.   Weighing daily. Not adding salt to her food and is trying to read food labels for sodium content. Currently wearing compression socks on her lower legs and has been for ~ 6 weeks. Edema still persists but seems to be better.   Past Medical History:  Diagnosis Date   Anxiety    Arthritis    Back pain    BPPV (benign paroxysmal positional vertigo)    CHF (congestive heart failure) (HCC)    Chronic diastolic HF (heart failure) (HCC)    Complication of anesthesia    Post-operative hypoxia requiring supplemental oxygen   Current use of long term anticoagulation    Apixaban   CVA (cerebral vascular accident) (HCC) 02/28/2020   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; imaging from [facility]   Depression    Edema of both lower extremities    GERD (gastroesophageal reflux disease)    Hypertension    IBS (irritable bowel syndrome)     Lactose intolerance    Morbid obesity with BMI of 45.0-49.9, adult (HCC)    Obesity    Osteoarthritis    PAF (paroxysmal atrial fibrillation) (HCC)    Pre-diabetes    Sleep apnea    uses CPAP machine sometimes    Type 2 diabetes mellitus without complication (HCC)    Vitamin D deficiency    Past Surgical History:  Procedure Laterality Date   ABDOMINAL HYSTERECTOMY     BREAST EXCISIONAL BIOPSY Left    CARDIOVERSION N/A 03/05/2022   Procedure: CARDIOVERSION;  Surgeon: Debbe Odea, MD;  Location: [facility];  Service: Cardiovascular;  Laterality: N/A;   ENDOMETRIAL ABLATION     x2   ENDOSCOPIC CONCHA BULLOSA RESECTION Bilateral 08/08/2020   Procedure: ENDOSCOPIC CONCHA BULLOSA RESECTION;  Surgeon: Vernie Murders, MD;  Location: [facility];  Service: ENT;  Laterality: Bilateral;   ETHMOIDECTOMY Right 08/08/2020   Procedure: ETHMOIDECTOMY, LEFT PARTIAL ETHMOIDECTOMY;  Surgeon: Vernie Murders, MD;  Location: [facility];  Service: ENT;  Laterality: Right;   IMAGE GUIDED SINUS SURGERY N/A 08/08/2020   Procedure: IMAGE GUIDED SINUS SURGERY, RIGHT FRONTAL SINUSOTOMY;  Surgeon: Vernie Murders, MD;  Location: [facility];  Service: ENT;  Laterality: N/A;   LAPAROSCOPIC SLEEVE GASTRECTOMY     MAXILLARY ANTROSTOMY Bilateral 08/08/2020   Procedure: MAXILLARY ANTROSTOMY with tissue;  Surgeon: Vernie Murders, MD;  Location: [facility];  Service: ENT;  Laterality: Bilateral;   NASAL TURBINATE REDUCTION  08/27/2020  Procedure: TURBINATE REDUCTION /SUBMUCOSAL DEBRIDEMENT;  Surgeon: Vernie Murders, MD;  Location: [facility];  Service: ENT;;   TOTAL KNEE ARTHROPLASTY Right 06/2019   TOTAL KNEE ARTHROPLASTY Left 01/31/2021   [facility]   Family History  Problem Relation Age of Onset   Diabetes Mother    High blood pressure Mother    Heart disease Father    Cancer Brother    Mental illness Neg Hx    Social History   Tobacco Use   Smoking status: Never   Smokeless tobacco: Never   Substance Use Topics   Alcohol use: No    Alcohol/week: 0.0 standard drinks of alcohol    Comment: rare   Allergies  Allergen Reactions   Hydrocodone-Acetaminophen Other (See Comments)    Extreme headache   Ace Inhibitors Cough   Hctz [Hydrochlorothiazide] Rash    face   Prior to Admission medications   Medication Sig Start Date End Date Taking? Authorizing Provider  apixaban (ELIQUIS) 5 MG TABS tablet Take 1 tablet (5 mg total) by mouth 2 (two) times daily. 01/07/22  Yes Agbor-Etang, Arlys John, MD  atorvastatin (LIPITOR) 10 MG tablet TAKE 1 TABLET BY MOUTH EVERY DAY 03/09/22  Yes Sowles, Danna Hefty, MD  diltiazem (DILACOR XR) 180 MG 24 hr capsule Take 180 mg by mouth daily.   Yes [provider]  famotidine (PEPCID) 20 MG tablet Take 1 tablet (20 mg total) by mouth daily as needed for indigestion. 01/22/22  Yes Sowles, Danna Hefty, MD  furosemide (LASIX) 40 MG tablet Take 1 tablet (40 mg total) by mouth daily. 03/06/22  Yes Sunnie Nielsen, DO  gabapentin (NEURONTIN) 100 MG capsule Take 100 mg by mouth at bedtime. 12/20/21  Yes [provider]  metoprolol tartrate 75 MG TABS Take 150 mg by mouth 2 (two) times daily. 03/06/22 04/05/22 Yes Sunnie Nielsen, DO  Multiple Vitamin (MULTIVITAMIN) tablet Take 1 tablet by mouth daily. BariatricPal Multivitamin   Yes [provider]  olmesartan (BENICAR) 20 MG tablet Take 1 tablet (20 mg total) by mouth daily. 03/06/22  Yes Sunnie Nielsen, DO  potassium chloride (KLOR-CON M) 10 MEQ tablet Take 2 tablets (20 mEq total) by mouth daily. 03/06/22  Yes Sunnie Nielsen, DO  Semaglutide, 2 MG/DOSE, (OZEMPIC, 2 MG/DOSE,) 8 MG/3ML SOPN Inject 2 mg into the skin once a week. 11/14/21  Yes Danford, Orpha Bur D, NP  triamcinolone (NASACORT) 55 MCG/ACT AERO nasal inhaler Place 2 sprays into the nose daily.   Yes [provider]  venlafaxine XR (EFFEXOR-XR) 37.5 MG 24 hr capsule Take 1 capsule (37.5 mg total) by mouth daily with  breakfast. Take along with 75 mg daily 09/27/21  Yes Eappen, Levin Bacon, MD  venlafaxine XR (EFFEXOR-XR) 75 MG 24 hr capsule Take 1 capsule (75 mg total) by mouth daily with breakfast. Take along with 37.5 mg daily 09/27/21  Yes Eappen, Levin Bacon, MD  Vitamin D, Ergocalciferol, (DRISDOL) 1.25 MG (50000 UNIT) CAPS capsule Take 1 capsule (50,000 Units total) by mouth every 7 (seven) days. 11/14/21  Yes Danford, Jinny Blossom, NP  XIIDRA 5 % SOLN Place 1 drop into both eyes 2 (two) times daily. Patient not taking: Reported on 03/17/2022 02/03/17   [provider]   Review of Systems  Constitutional:  Positive for fatigue (with exertion). Negative for appetite change.  HENT:  Negative for congestion, postnasal drip and sore throat.   Eyes: Negative.   Respiratory:  Positive for shortness of breath (minimal) and wheezing (at times). Negative for cough and  chest tightness.   Cardiovascular:  Positive for palpitations (at times) and leg swelling. Negative for chest pain.  Gastrointestinal:  Negative for abdominal distention and abdominal pain.  Endocrine: Negative.   Genitourinary: Negative.   Musculoskeletal:  Positive for back pain.  Skin: Negative.   Allergic/Immunologic: Negative.   Neurological:  Positive for weakness (legs). Negative for dizziness and light-headedness.  Hematological:  Negative for adenopathy. Does not bruise/bleed easily.  Psychiatric/Behavioral:  Negative for dysphoric mood and sleep disturbance (sleeping on 1-2 pillows). The patient is not nervous/anxious.     Vitals:   03/17/22 0901  BP: (!) 152/82  Pulse: 77  Resp: 20  SpO2: 94%  Weight: 260 lb (117.9 kg)   Wt Readings from Last 3 Encounters:  03/17/22 260 lb (117.9 kg)  03/10/22 260 lb (117.9 kg)  03/07/22 264 lb 8 oz (120 kg)   Lab Results  Component Value Date   CREATININE 0.66 03/10/2022   CREATININE 0.85 03/05/2022   CREATININE 0.74 03/04/2022   Physical Exam Vitals and nursing note reviewed.   Constitutional:      Appearance: Normal appearance.  HENT:     Head: Normocephalic and atraumatic.  Cardiovascular:     Rate and Rhythm: Normal rate. Rhythm irregular.     Heart sounds: Murmur (II/VI) heard.  Pulmonary:     Effort: Pulmonary effort is normal. No respiratory distress.     Breath sounds: No wheezing or rales.  Abdominal:     General: There is no distension.     Palpations: Abdomen is soft.     Tenderness: There is no abdominal tenderness.  Musculoskeletal:        General: No tenderness.     Cervical back: Normal range of motion and neck supple.     Right lower leg: Edema (1+ pitting) present.     Left lower leg: Edema (1+ pitting) present.  Skin:    General: Skin is warm and dry.  Neurological:     General: No focal deficit present.     Mental Status: She is alert and oriented to person, place, and time.  Psychiatric:        Mood and Affect: Mood normal.        Behavior: Behavior normal.        Thought Content: Thought content normal.    Assessment & Plan:  1: Chronic heart failure with reduced ejection fraction- - NYHA class III - euvolemic today - weighing daily; reminded to call for an overnight weight gain of > 2 pounds or a weekly weight gain of > 5 pounds - not adding salt and reading food labels to keep daily sodium intake to < 2000mg   - on GDMT of benicar and metoprolol (although tartrate) - discussed GDMT and possible changes after her ablation is done - saw cardiology ([place]) 03/07/22 - wearing compression socks daily but edema persist - encouraged to elevate legs when sitting for long periods of time - discussed compression boots and  brochure provided; she will think about them and we can re-visit this conversation at her next visit - limited in her ability to exercise due to her symptoms - BNP 03/02/22 was 577.9 - has received her flu vaccine for this season  2: HTN- - BP mildly elevated (152/82) - saw PCP Carlynn Purl(Sow[place]) 03/10/22 - BMP  03/10/22 reviewed and showed sodium 140, potassium 4.0, creatinine 0.66 & GFR 97  3: Atrial fibrillation- - saw EP Lalla Brothers([place]) 02/19/22 - has had previous cardioversion - has ablation scheduled  for 05/29/22 - currently on apixaban, diltiazem and metoprolol tartrate  4: DM- - A1c 02/10/22 was 6.0% - currently using ozempic   Medication bottles reviewed.   Return in 6 weeks, sooner if needed

## 2022-03-18 ENCOUNTER — Ambulatory Visit: Payer: Medicare HMO | Admitting: Family

## 2022-03-19 ENCOUNTER — Encounter: Payer: Self-pay | Admitting: Nurse Practitioner

## 2022-03-19 ENCOUNTER — Telehealth (INDEPENDENT_AMBULATORY_CARE_PROVIDER_SITE_OTHER): Payer: Medicare HMO | Admitting: Nurse Practitioner

## 2022-03-19 ENCOUNTER — Other Ambulatory Visit: Payer: Self-pay

## 2022-03-19 DIAGNOSIS — R311 Benign essential microscopic hematuria: Secondary | ICD-10-CM | POA: Diagnosis not present

## 2022-03-19 DIAGNOSIS — R300 Dysuria: Secondary | ICD-10-CM

## 2022-03-19 LAB — POCT URINALYSIS DIPSTICK
Bilirubin, UA: NEGATIVE
Glucose, UA: NEGATIVE
Ketones, UA: NEGATIVE
Leukocytes, UA: NEGATIVE
Nitrite, UA: NEGATIVE
Protein, UA: POSITIVE — AB
Spec Grav, UA: 1.02 (ref 1.010–1.025)
Urobilinogen, UA: 0.2 E.U./dL
pH, UA: 5 (ref 5.0–8.0)

## 2022-03-19 MED ORDER — CEPHALEXIN 500 MG PO CAPS: 500.0000 mg | ORAL_CAPSULE | Freq: Two times a day (BID) | ORAL | 0 refills | Status: AC

## 2022-03-19 NOTE — Progress Notes (Signed)
Name: Regina Christensen   MRN: [id number]    DOB: 27-Sep-1955   Date:03/19/2022       Progress Note  Subjective  Chief Complaint  Chief Complaint  Patient presents with   Hematuria    since yesterday    I connected with  [name]  on 03/19/22 at  3:20 PM EST by a video enabled telemedicine application and verified that I am speaking with the correct person using two identifiers.  I discussed the limitations of evaluation and management by telemedicine and the availability of in person appointments. The patient expressed understanding and agreed to proceed with a virtual visit  Staff also discussed with the patient that there may be a patient responsible charge related to this service. Patient Location: parking lot Provider Location: [facility] Additional Individuals present: alone  HPI  Hematuria/dysuria:  patient reports she noticed blood yesterday in her urine. She says she also experienced some dysuria.  Patient denies any fever or back pain. Urine dip shows positive for blood and protein. Will send urine for culture and start antibiotic.  IF urine culture negative will refer to urology due to hematuria. Patient was initially hesitant for urology consult but has agreed if urine culture is negative and symptoms persist.  Patient agreed with plan of care.   Patient Active Problem List   Diagnosis Date Noted   Chronic diastolic heart failure (HCC) 03/06/2022   Obesity (BMI 30-39.9) 03/04/2022   HFrEF (heart failure with reduced ejection fraction) (HCC) 03/03/2022   Primary hypertension 03/03/2022   Atrial fibrillation with RVR (HCC) 03/02/2022   Transaminitis 03/02/2022   Dilated cardiomyopathy (HCC) 03/02/2022   Nonrheumatic tricuspid valve regurgitation 03/02/2022   Osteopenia of necks of both femurs 01/22/2022   Bilateral lower extremity edema 09/30/2021   Acute on chronic combined systolic and diastolic CHF (congestive heart failure) (HCC) 09/10/2021   History of total  bilateral knee replacement 09/10/2021   History of blood transfusion 09/10/2020   Current use of long term anticoagulation    Diabetes mellitus (HCC)    Paroxysmal atrial fibrillation (HCC)    GERD without esophagitis    Depression    Vitamin D deficiency 04/12/2020   History of CVA (cerebrovascular accident) without residual deficits 03/05/2020   History of hysterectomy 07/19/2019   Anxiety 07/19/2019   MDD (major depressive disorder), recurrent, in full remission (HCC) 07/19/2019   Insomnia due to mental condition 07/19/2019   Bilateral carpal tunnel syndrome 07/30/2016   OSA (obstructive sleep apnea) 03/07/2016   BPPV (benign paroxysmal positional vertigo) 08/28/2015   Hypertension associated with type 2 diabetes mellitus (HCC) 11/09/2014   GAD (generalized anxiety disorder) 11/09/2014   Acanthosis nigricans 11/09/2014   Morbid obesity (HCC) 01/06/2012    Social History   Tobacco Use   Smoking status: Never   Smokeless tobacco: Never  Substance Use Topics   Alcohol use: No    Alcohol/week: 0.0 standard drinks of alcohol    Comment: rare     Current Outpatient Medications:    apixaban (ELIQUIS) 5 MG TABS tablet, Take 1 tablet (5 mg total) by mouth 2 (two) times daily., Disp: 180 tablet, Rfl: 3   atorvastatin (LIPITOR) 10 MG tablet, TAKE 1 TABLET BY MOUTH EVERY DAY, Disp: 90 tablet, Rfl: 3   cephALEXin (KEFLEX) 500 MG capsule, Take 1 capsule (500 mg total) by mouth 2 (two) times daily for 5 days., Disp: 10 capsule, Rfl: 0   diltiazem (DILACOR XR) 180 MG 24 hr capsule, Take  180 mg by mouth daily., Disp: , Rfl:    famotidine (PEPCID) 20 MG tablet, Take 1 tablet (20 mg total) by mouth daily as needed for indigestion., Disp: 90 tablet, Rfl: 1   furosemide (LASIX) 40 MG tablet, Take 1 tablet (40 mg total) by mouth daily., Disp: 30 tablet, Rfl: 0   gabapentin (NEURONTIN) 100 MG capsule, Take 100 mg by mouth at bedtime., Disp: , Rfl:    metoprolol tartrate 75 MG TABS, Take 150 mg  by mouth 2 (two) times daily., Disp: 120 tablet, Rfl: 0   Multiple Vitamin (MULTIVITAMIN) tablet, Take 1 tablet by mouth daily. BariatricPal Multivitamin, Disp: , Rfl:    olmesartan (BENICAR) 20 MG tablet, Take 1 tablet (20 mg total) by mouth daily., Disp: 30 tablet, Rfl: 0   potassium chloride (KLOR-CON M) 10 MEQ tablet, Take 2 tablets (20 mEq total) by mouth daily., Disp: 60 tablet, Rfl: 0   Semaglutide, 2 MG/DOSE, (OZEMPIC, 2 MG/DOSE,) 8 MG/3ML SOPN, Inject 2 mg into the skin once a week., Disp: 3 mL, Rfl: 0   triamcinolone (NASACORT) 55 MCG/ACT AERO nasal inhaler, Place 2 sprays into the nose daily., Disp: , Rfl:    venlafaxine XR (EFFEXOR-XR) 37.5 MG 24 hr capsule, Take 1 capsule (37.5 mg total) by mouth daily with breakfast. Take along with 75 mg daily, Disp: 90 capsule, Rfl: 1   venlafaxine XR (EFFEXOR-XR) 75 MG 24 hr capsule, Take 1 capsule (75 mg total) by mouth daily with breakfast. Take along with 37.5 mg daily, Disp: 90 capsule, Rfl: 1   Vitamin D, Ergocalciferol, (DRISDOL) 1.25 MG (50000 UNIT) CAPS capsule, Take 1 capsule (50,000 Units total) by mouth every 7 (seven) days., Disp: 12 capsule, Rfl: 1   XIIDRA 5 % SOLN, Place 1 drop into both eyes 2 (two) times daily. (Patient not taking: Reported on 03/17/2022), Disp: , Rfl: 4  Allergies  Allergen Reactions   Hydrocodone-Acetaminophen Other (See Comments)    Extreme headache   Ace Inhibitors Cough   Hctz [Hydrochlorothiazide] Rash    face    I personally reviewed active problem list, medication list, allergies, notes from last encounter with the patient/caregiver today.  ROS  Constitutional: Negative for fever or weight change.  Respiratory: Negative for cough and shortness of breath.   Cardiovascular: Negative for chest pain or palpitations.  Gastrointestinal: Negative for abdominal pain, no bowel changes.  Musculoskeletal: Negative for gait problem or joint swelling.  Skin: Negative for rash.  Neurological: Negative for  dizziness or headache.  No other specific complaints in a complete review of systems (except as listed in HPI above).   Objective  Virtual encounter, vitals not obtained.  There is no height or weight on file to calculate BMI.  Nursing Note and Vital Signs reviewed.  Physical Exam  Awake, alert and oriented, speaking in complete sentences  Results for orders placed or performed in visit on 03/19/22 (from the past 72 hour(s))  POCT urinalysis dipstick     Status: Abnormal   Collection Time: 03/19/22  2:58 PM  Result Value Ref Range   Color, UA gold    Clarity, UA clear    Glucose, UA Negative Negative   Bilirubin, UA negative    Ketones, UA negative    Spec Grav, UA 1.020 1.010 - 1.025   Blood, UA large    pH, UA 5.0 5.0 - 8.0   Protein, UA Positive (A) Negative   Urobilinogen, UA 0.2 0.2 or 1.0 E.U./dL   Nitrite, UA negative  Leukocytes, UA Negative Negative   Appearance clear    Odor none     Assessment & Plan  Problem List Items Addressed This Visit   None Visit Diagnoses     Dysuria    -  Primary   patient reported hematuria and dysuria started yesterday. will treat with antibiotics and send urine for cutlure. If culture negative will refer to urology   Relevant Medications   cephALEXin (KEFLEX) 500 MG capsule   Benign essential microscopic hematuria       patient reported hematuria and dysuria started yesterday. will treat with antibiotics and send urine for cutlure. If culture negative will refer to urology   Relevant Medications   cephALEXin (KEFLEX) 500 MG capsule   Other Relevant Orders   POCT urinalysis dipstick (Completed)   Urine Culture         -Red flags and when to present for emergency care or RTC including fever >101.67F, chest pain, shortness of breath, new/worsening/un-resolving symptoms,  reviewed with patient at time of visit. Follow up and care instructions discussed and provided in AVS. - I discussed the assessment and treatment plan  with the patient. The patient was provided an opportunity to ask questions and all were answered. The patient agreed with the plan and demonstrated an understanding of the instructions.  I provided 15 minutes of non-face-to-face time during this encounter.  Berniece Salines, FNP

## 2022-03-20 LAB — URINE CULTURE
MICRO NUMBER:: 14247642
Result:: NO GROWTH
SPECIMEN QUALITY:: ADEQUATE

## 2022-03-21 ENCOUNTER — Other Ambulatory Visit: Payer: Self-pay | Admitting: Nurse Practitioner

## 2022-03-21 DIAGNOSIS — R319 Hematuria, unspecified: Secondary | ICD-10-CM

## 2022-03-24 ENCOUNTER — Telehealth: Payer: Self-pay

## 2022-03-26 ENCOUNTER — Telehealth: Payer: Self-pay

## 2022-03-26 DIAGNOSIS — R600 Localized edema: Secondary | ICD-10-CM

## 2022-03-26 DIAGNOSIS — I5032 Chronic diastolic (congestive) heart failure: Secondary | ICD-10-CM

## 2022-03-26 DIAGNOSIS — E559 Vitamin D deficiency, unspecified: Secondary | ICD-10-CM

## 2022-03-26 NOTE — Progress Notes (Signed)
Chronic Care Management Pharmacy Assistant   Name: Regina Christensen  MRN: [id number] DOB: 01-28-1956  Reason for Encounter: Medication Review/Medication Coordination for [facility]   Recent office visits:  03/19/2022 Della Goo, FNP (Video PCP Visit) for Hematuria- Started: Cephalexin 500 mg twice daily, Lab orders placed,   03/10/2022 [name], MD (PCP Office Visit) for Hospital Discharge- Stopped: Benzonatate due to patient completed the course, Lab orders placed, No follow-up noted  02/12/2022 Della Goo, FNP (PCP Video Visit) for Sinusitis- Started: Benzonatate 200 mg twice daily prn, Prednisone 10 mg taper, Lab order placed,   02/10/2022 [name], MD (PCP Office Visit) for Fatigue- No medication changes noted, Lab orders placed, No follow-up noted  Recent consult visits:  03/17/2022 Clarisa Kindred, FNP (CHF Clinic) for CHF- Stopped: Acetaminophen 650 mg, Hydroxyzine HCl 12.5-25 mg, Meclizine HCl 12.5 mg, No orders placed, BP 152/82, pt. Instructed to follow-up in 6 weeks  03/07/2022 Charlsie Quest, NP (Cardiology) for 12 week Post Hospital follow-up- No medication changes noted, EKG 12-Lead order placed, No follow-up noted  02/26/2022 Charlsie Quest, NP (Cardiology) for Chronic CHF- Changed: Furosemide 20 mg daily prn to 20 mg daily prn, Take 20 mg (one tab) daily for 5 days, Metoprolol Tartrate 100 mg twice daily to 150 mg 2 times daily, Stopped: Prednisone 10 mg due to patient completed course, Lab orders placed, BP 120/100 Pulse 131, Patient to follow-up in 1 week or sooner  02/19/2022 Steffanie Dunn, MD (Cardiology) for Paroxysmal A-Fib- No medication changes noted, No orders placed, Patient instructed to follow-up as needed  02/05/2022 William Hamburger, NP (Weight Management) for HTN with Type II DM-No medication changes noted, No orders placed, Patient to follow-up in 4 weeks  Hospital visits:  Medication Reconciliation was completed by comparing discharge  summary, patient's EMR and Pharmacy list, and upon discussion with patient.  Admitted to the hospital on 03/02/2022 due to AFIB with RVR. Discharge date was 03/06/2022. Discharged from [facility].    New?Medications Started at [facility] Discharge:?? -START taking: diltiazem (CARDIZEM CD) Take 1 capsule (180 mg total) by mouth daily. Start taking on: March 07, 2022  Medication Changes at Hospital Discharge: -CHANGE how you take: furosemide (LASIX) 40 MG tablet Take 1 tablet (40 mg total) by mouth daily. What changed: medication strength how much to take when to take this reasons to take this additional instructions Metoprolol Tartrate 75 MG Tabs Take 150 mg by mouth 2 (two) times daily. What changed: medication strength olmesartan (BENICAR) 20 MG tablet Take 1 tablet (20 mg total) by mouth daily. What changed: medication strength how much to take potassium chloride (KLOR-CON M) 10 MEQ tablet Take 2 tablets (20 mEq total) by mouth daily. What changed: additional instructions  Medications Discontinued at Hospital Discharge: -STOP taking: amLODipine 5 MG tablet (NORVASC)  Medications that remain the same after Hospital Discharge:??  -All other medications will remain the same.    Medications: Outpatient Encounter Medications as of 03/26/2022  Medication Sig Note   apixaban (ELIQUIS) 5 MG TABS tablet Take 1 tablet (5 mg total) by mouth 2 (two) times daily.    atorvastatin (LIPITOR) 10 MG tablet TAKE 1 TABLET BY MOUTH EVERY DAY    diltiazem (DILACOR XR) 180 MG 24 hr capsule Take 180 mg by mouth daily.    famotidine (PEPCID) 20 MG tablet Take 1 tablet (20 mg total) by mouth daily as needed for indigestion.    furosemide (LASIX) 40 MG tablet Take 1 tablet (40  mg total) by mouth daily.    gabapentin (NEURONTIN) 100 MG capsule Take 100 mg by mouth at bedtime. 01/22/2022: Prescribed by Dr. Noralyn Pick   metoprolol tartrate 75 MG TABS Take 150 mg by mouth 2  (two) times daily.    Multiple Vitamin (MULTIVITAMIN) tablet Take 1 tablet by mouth daily. BariatricPal Multivitamin    olmesartan (BENICAR) 20 MG tablet Take 1 tablet (20 mg total) by mouth daily.    potassium chloride (KLOR-CON M) 10 MEQ tablet Take 2 tablets (20 mEq total) by mouth daily.    Semaglutide, 2 MG/DOSE, (OZEMPIC, 2 MG/DOSE,) 8 MG/3ML SOPN Inject 2 mg into the skin once a week.    triamcinolone (NASACORT) 55 MCG/ACT AERO nasal inhaler Place 2 sprays into the nose daily.    venlafaxine XR (EFFEXOR-XR) 37.5 MG 24 hr capsule Take 1 capsule (37.5 mg total) by mouth daily with breakfast. Take along with 75 mg daily    venlafaxine XR (EFFEXOR-XR) 75 MG 24 hr capsule Take 1 capsule (75 mg total) by mouth daily with breakfast. Take along with 37.5 mg daily    Vitamin D, Ergocalciferol, (DRISDOL) 1.25 MG (50000 UNIT) CAPS capsule Take 1 capsule (50,000 Units total) by mouth every 7 (seven) days.    XIIDRA 5 % SOLN Place 1 drop into both eyes 2 (two) times daily. (Patient not taking: Reported on 03/17/2022) 07/16/2021: PRN   No facility-administered encounter medications on file as of 03/26/2022.   Care Gaps: Diabetic Eye Exam BP> 140/90  Star Rating Drugs: Atorvastatin 10 mg last filled on 03/19/2022 for a 90-Day supply with [facility] Olmesartan 20 mg last filled on 03/06/2022 for a 30-Day supply for [facility] Ozempic 2 mg patient receives this medication from [employer] Patient Assistance Program  BP Readings from Last 3 Encounters:  03/17/22 (!) 152/82  03/10/22 132/74  03/07/22 130/80    Lab Results  Component Value Date   HGBA1C 6.0 (H) 02/10/2022    Patient obtains medications through Vials  30 Days   Last adherence delivery included:  This is my first Med Sync Coordination for this patient  Patient declined medications for the month of November: 1st Med sync  Patient is due for next adherence delivery on: 04/07/2022 1st Route.  Called patient and  reviewed medications and coordinated delivery.  This delivery to include: Atorvastatin 10 mg 1 tablet daily (Lunch) Famotidine 20 mg 1 tablet daily prn Metoprolol 75 mg take 150 mg by mouth twice daily (Lunch, Bedtime) Venlafaxine 37.5 mg take 1 capsule daily with breakfast. Take along with 75 mg daily (Breakfast) Venlafaxine 75 mg 1 capsule daily with breakfast. Take along with 37.5 mg capsule (Breakfast) Vitamin D 50, 000 Unit 1 capsule every 7 days (Lunch) Olmesartan 20 mg 1 tablet daily (Lunch) Furosemide 40 mg 1 tablet daily  (Lunch) Diltiazem 180 mg 1 tablet daily (Lunch) Potassium 10 mEq two tablets daily (Lunch)  Patient declined the following medications for the month of December: Amlodipine 5 mg was discontinued after patient discharged from the hospital on 03/06/2022  Patient needs refills for Metoprolol 75 mg, Vitamin D 50,000 Units, Olmesartan 20 mg as the dose is no longer 40 mg, Furosemide 40 mg, Diltiazem 180 mg, Potassium 10 mEq . Medications were sent in from the hospital CPP should be able to send in refills for these medications  Confirmed delivery date of 04/07/2022 1st, advised patient that pharmacy will contact them the morning of delivery.  Has she turned in the application for 2024  for her Ozempic? Patient stated she would turn this in so Trinna Post can have it by next Wednesday so it can be faxed off prior to his PTO Leave.  Patient reports that she is feeling better from her admit to the hospital. Per patient she is still not 100 percent but much better than she was. Patient reports that she is having surgery in February. Patient has confirmed her changes in medications, and we went over everything in detail. Patient stated that I have everything correct. I informed the patient if she has any discrepancies when she receives her delivery to immediately give me a call.   Patient has a follow-up appointment with CPP on 04/30/2022 @ 0830.  Adelene Idler, CPA/CMA Clinical  Pharmacist Assistant Phone: [phone]   12/09  Left VM requesting patient to return my call

## 2022-03-31 ENCOUNTER — Ambulatory Visit (INDEPENDENT_AMBULATORY_CARE_PROVIDER_SITE_OTHER): Payer: Medicare HMO | Admitting: Physician Assistant

## 2022-03-31 MED ORDER — FUROSEMIDE 40 MG PO TABS: 40.0000 mg | ORAL_TABLET | Freq: Every day | ORAL | 0 refills | Status: DC

## 2022-03-31 MED ORDER — OLMESARTAN MEDOXOMIL 20 MG PO TABS: 20.0000 mg | ORAL_TABLET | Freq: Every day | ORAL | 0 refills | Status: DC

## 2022-03-31 MED ORDER — DILTIAZEM HCL ER 180 MG PO CP24: 180.0000 mg | ORAL_CAPSULE | Freq: Every day | ORAL | 0 refills | Status: DC

## 2022-03-31 MED ORDER — VITAMIN D (ERGOCALCIFEROL) 1.25 MG (50000 UNIT) PO CAPS: 50000.0000 [IU] | ORAL_CAPSULE | ORAL | 1 refills | Status: DC

## 2022-03-31 MED ORDER — POTASSIUM CHLORIDE CRYS ER 10 MEQ PO TBCR: 20.0000 meq | EXTENDED_RELEASE_TABLET | Freq: Every day | ORAL | 0 refills | Status: DC

## 2022-03-31 MED ORDER — METOPROLOL TARTRATE 75 MG PO TABS: 150.0000 mg | ORAL_TABLET | Freq: Two times a day (BID) | ORAL | 0 refills | Status: DC

## 2022-03-31 NOTE — Addendum Note (Signed)
Addended by: Julious Payer A on: 03/31/2022 08:26 AM   Modules accepted: Orders

## 2022-04-01 ENCOUNTER — Ambulatory Visit: Payer: Medicare HMO | Admitting: Urology

## 2022-04-03 ENCOUNTER — Other Ambulatory Visit (HOSPITAL_BASED_OUTPATIENT_CLINIC_OR_DEPARTMENT_OTHER): Payer: Self-pay | Admitting: Osteopathic Medicine

## 2022-04-04 ENCOUNTER — Ambulatory Visit: Payer: Medicare HMO | Attending: Cardiology | Admitting: Cardiology

## 2022-04-04 ENCOUNTER — Other Ambulatory Visit: Payer: Self-pay

## 2022-04-04 ENCOUNTER — Encounter: Payer: Self-pay | Admitting: Cardiology

## 2022-04-04 VITALS — BP 134/82 | HR 74 | Ht 68.0 in | Wt 258.0 lb

## 2022-04-04 DIAGNOSIS — I5032 Chronic diastolic (congestive) heart failure: Secondary | ICD-10-CM

## 2022-04-04 DIAGNOSIS — Z6839 Body mass index (BMI) 39.0-39.9, adult: Secondary | ICD-10-CM

## 2022-04-04 DIAGNOSIS — I10 Essential (primary) hypertension: Secondary | ICD-10-CM

## 2022-04-04 DIAGNOSIS — I480 Paroxysmal atrial fibrillation: Secondary | ICD-10-CM | POA: Diagnosis not present

## 2022-04-04 MED ORDER — DILTIAZEM HCL ER 180 MG PO CP24: 180.0000 mg | ORAL_CAPSULE | Freq: Every day | ORAL | 0 refills | Status: DC

## 2022-04-04 MED ORDER — APIXABAN 5 MG PO TABS: 5.0000 mg | ORAL_TABLET | Freq: Two times a day (BID) | ORAL | 3 refills | Status: DC

## 2022-04-04 NOTE — Telephone Encounter (Signed)
While patient was in the office for her appt today she brought her renewal patient assistance application for Eliquis along with proof of income.   Application was signed and faxed to company.

## 2022-04-04 NOTE — Progress Notes (Signed)
Cardiology Office Note:    Date:  04/04/2022   ID:  EVORA SCHECHTER, DOB 1955/08/15, MRN [id number]  PCP:  [name], MD  Cardiologist:  Debbe Odea, MD  Electrophysiologist:  Lanier Prude, MD   Referring MD: [name], MD   No chief complaint on file.   History of Present Illness:    Regina Christensen is a 66 y.o. female with a hx of hypertension, paroxysmal A. fib on Eliquis, CVA 02/2020 (2/2 not taking eliquis), obesity, sleep apnea who presents for follow-up.  Recently seen in the hospital due to A-fib RVR.  Medications were adjusted, Cardizem was started, Lopressor increased.  Has not had any symptoms since, evaluated by EP, ablation being planned in about 2 months.  Otherwise doing okay, compliant with medications as prescribed, denies any bleeding issues with Eliquis.   Prior notes Echocardiogram 02/2020 EF 55 to 60%, mildly dilated left atrium  Past Medical History:  Diagnosis Date   Anxiety    Arthritis    Back pain    BPPV (benign paroxysmal positional vertigo)    CHF (congestive heart failure) (HCC)    Chronic diastolic HF (heart failure) (HCC)    Complication of anesthesia    Post-operative hypoxia requiring supplemental oxygen   Current use of long term anticoagulation    Apixaban   CVA (cerebral vascular accident) (HCC) 02/28/2020   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; imaging from [facility]   Depression    Edema of both lower extremities    GERD (gastroesophageal reflux disease)    Hypertension    IBS (irritable bowel syndrome)    Lactose intolerance    Morbid obesity with BMI of 45.0-49.9, adult (HCC)    Obesity    Osteoarthritis    PAF (paroxysmal atrial fibrillation) (HCC)    Pre-diabetes    Sleep apnea    uses CPAP machine sometimes    Type 2 diabetes mellitus without complication (HCC)    Vitamin D deficiency     Past Surgical History:  Procedure Laterality Date   ABDOMINAL HYSTERECTOMY     BREAST EXCISIONAL  BIOPSY Left    CARDIOVERSION N/A 03/05/2022   Procedure: CARDIOVERSION;  Surgeon: Debbe Odea, MD;  Location: [facility] ORS;  Service: Cardiovascular;  Laterality: N/A;   ENDOMETRIAL ABLATION     x2   ENDOSCOPIC CONCHA BULLOSA RESECTION Bilateral 08/08/2020   Procedure: ENDOSCOPIC CONCHA BULLOSA RESECTION;  Surgeon: Vernie Murders, MD;  Location: [facility] ORS;  Service: ENT;  Laterality: Bilateral;   ETHMOIDECTOMY Right 08/08/2020   Procedure: ETHMOIDECTOMY, LEFT PARTIAL ETHMOIDECTOMY;  Surgeon: Vernie Murders, MD;  Location: [facility] ORS;  Service: ENT;  Laterality: Right;   IMAGE GUIDED SINUS SURGERY N/A 08/08/2020   Procedure: IMAGE GUIDED SINUS SURGERY, RIGHT FRONTAL SINUSOTOMY;  Surgeon: Vernie Murders, MD;  Location: [facility] ORS;  Service: ENT;  Laterality: N/A;   LAPAROSCOPIC SLEEVE GASTRECTOMY     MAXILLARY ANTROSTOMY Bilateral 08/08/2020   Procedure: MAXILLARY ANTROSTOMY with tissue;  Surgeon: Vernie Murders, MD;  Location: [facility] ORS;  Service: ENT;  Laterality: Bilateral;   NASAL TURBINATE REDUCTION  08/27/2020   Procedure: TURBINATE REDUCTION /SUBMUCOSAL DEBRIDEMENT;  Surgeon: Vernie Murders, MD;  Location: [facility] ORS;  Service: ENT;;   TOTAL KNEE ARTHROPLASTY Right 06/2019   TOTAL KNEE ARTHROPLASTY Left 01/31/2021   [facility]    Current Medications: Current Meds  Medication Sig   apixaban (ELIQUIS) 5 MG TABS tablet Take 1 tablet (5 mg total) by mouth 2 (two) times  daily.   atorvastatin (LIPITOR) 10 MG tablet TAKE 1 TABLET BY MOUTH EVERY DAY   famotidine (PEPCID) 20 MG tablet Take 1 tablet (20 mg total) by mouth daily as needed for indigestion.   furosemide (LASIX) 40 MG tablet Take 1 tablet (40 mg total) by mouth daily.   gabapentin (NEURONTIN) 100 MG capsule Take 100 mg by mouth at bedtime.   Metoprolol Tartrate 75 MG TABS Take 150 mg by mouth 2 (two) times daily.   Multiple Vitamin (MULTIVITAMIN) tablet Take 1 tablet by mouth daily. BariatricPal Multivitamin    olmesartan (BENICAR) 20 MG tablet Take 1 tablet (20 mg total) by mouth daily.   potassium chloride (KLOR-CON M) 10 MEQ tablet Take 2 tablets (20 mEq total) by mouth daily.   Semaglutide, 2 MG/DOSE, (OZEMPIC, 2 MG/DOSE,) 8 MG/3ML SOPN Inject 2 mg into the skin once a week.   triamcinolone (NASACORT) 55 MCG/ACT AERO nasal inhaler Place 2 sprays into the nose daily.   venlafaxine XR (EFFEXOR-XR) 37.5 MG 24 hr capsule Take 1 capsule (37.5 mg total) by mouth daily with breakfast. Take along with 75 mg daily   venlafaxine XR (EFFEXOR-XR) 75 MG 24 hr capsule Take 1 capsule (75 mg total) by mouth daily with breakfast. Take along with 37.5 mg daily   Vitamin D, Ergocalciferol, (DRISDOL) 1.25 MG (50000 UNIT) CAPS capsule Take 1 capsule (50,000 Units total) by mouth every 7 (seven) days.   XIIDRA 5 % SOLN Place 1 drop into both eyes 2 (two) times daily.   [DISCONTINUED] diltiazem (DILACOR XR) 180 MG 24 hr capsule Take 1 capsule (180 mg total) by mouth daily.     Allergies:   Hydrocodone-acetaminophen, Ace inhibitors, and Hctz [hydrochlorothiazide]   Social History   Socioeconomic History   Marital status: Divorced    Spouse name: Not on file   Number of children: 2   Years of education: Not on file   Highest education level: Associate degree: occupational, Scientist, product/process developmenttechnical, or vocational program  Occupational History   Occupation: Retired/ work PT    Employer: [employer]  Tobacco Use   Smoking status: Never   Smokeless tobacco: Never  Vaping Use   Vaping Use: Never used  Substance and Sexual Activity   Alcohol use: No    Alcohol/week: 0.0 standard drinks of alcohol    Comment: rare   Drug use: No   Sexual activity: Not on file  Other Topics Concern   Not on file  Social History Narrative   Not on file   Social Determinants of Health   Financial Resource Strain: High Risk (01/22/2022)   Overall Financial Resource Strain (CARDIA)    Difficulty of Paying Living Expenses: Very hard  Food Insecurity:  No Food Insecurity (03/02/2022)   Hunger Vital Sign    Worried About Running Out of Food in the Last Year: Never true    Ran Out of Food in the Last Year: Never true  Transportation Needs: No Transportation Needs (03/02/2022)   PRAPARE - Administrator, Civil ServiceTransportation    Lack of Transportation (Medical): No    Lack of Transportation (Non-Medical): No  Physical Activity: Insufficiently Active (10/18/2021)   Exercise Vital Sign    Days of Exercise per Week: 3 days    Minutes of Exercise per Session: 10 min  Stress: No Stress Concern Present (10/18/2021)   [employer] - Occupational Stress Questionnaire    Feeling of Stress : Only a little  Social Connections: Moderately Integrated (10/18/2021)   Social Connection and  Isolation Panel [NHANES]    Frequency of Communication with Friends and Family: More than three times a week    Frequency of Social Gatherings with Friends and Family: More than three times a week    Attends Religious Services: More than 4 times per year    Active Member of [employer] or Organizations: Yes    Attends Banker Meetings: Never    Marital Status: Divorced     Family History: The patient's family history includes Cancer in her brother; Diabetes in her mother; Heart disease in her father; High blood pressure in her mother. There is no history of Mental illness.  Her sister has atrial fibrillation, multiple nephews have atrial fibrillation.  ROS:   Please see the history of present illness.     All other systems reviewed and are negative.  EKGs/Labs/Other Studies Reviewed:    EKG:  EKG is  ordered today.  The ekg ordered today demonstrates normal sinus rhythm  Recent Labs: 03/02/2022: ALT 75; B Natriuretic Peptide 577.9; Magnesium 2.0; TSH 1.359 03/10/2022: BUN 14; Creat 0.66; Hemoglobin 14.1; Platelets 260; Potassium 4.0; Sodium 140  Recent Lipid Panel    Component Value Date/Time   CHOL 112 02/10/2022 1209   CHOL 151 12/05/2014 1117    TRIG 79 02/10/2022 1209   HDL 66 02/10/2022 1209   HDL 74 12/05/2014 1117   CHOLHDL 1.7 02/10/2022 1209   VLDL 14 03/01/2020 0306   LDLCALC 30 02/10/2022 1209    Physical Exam:    VS:  BP 134/82   Pulse 74   Ht 5\' 8"  (1.727 m)   Wt 258 lb (117 kg)   SpO2 97%   BMI 39.23 kg/m     Wt Readings from Last 3 Encounters:  04/04/22 258 lb (117 kg)  03/17/22 260 lb (117.9 kg)  03/10/22 260 lb (117.9 kg)     GEN:  Well nourished, well developed in no acute distress HEENT: Normal NECK: No JVD; No carotid bruits CARDIAC:RRR, no murmurs, rubs, gallops RESPIRATORY:  Clear to auscultation without rales, wheezing or rhonchi  ABDOMEN: Soft, non-tender, distended MUSCULOSKELETAL:  trace edema; No deformity  SKIN: Warm and dry NEUROLOGIC:  Alert and oriented x 3 PSYCHIATRIC:  Normal affect   ASSESSMENT:    1. Paroxysmal atrial fibrillation (HCC)   2. Primary hypertension   3. BMI 39.0-39.9,adult   4. Chronic diastolic heart failure (HCC)    PLAN:    Paroxysmal atrial fibrillation.   Continue Lopressor 150 mg twice daily, diltiazem XR 180 mg daily, Eliquis.  RFA being planned in about 2 months.  Appreciate input from EP. Hypertension, BP controlled.  Continue Lopressor, diltiazem, Benicar. Obesity, low-calorie diet, weight loss advised.  Continue Ozempic.  Follow-up in 6 months  Medication Adjustments/Labs and Tests Ordered: Current medicines are reviewed at length with the patient today.  Concerns regarding medicines are outlined above.  Orders Placed This Encounter  Procedures   EKG 12-Lead    Meds ordered this encounter  Medications   DISCONTD: diltiazem (DILACOR XR) 180 MG 24 hr capsule    Sig: Take 1 capsule (180 mg total) by mouth daily.    Dispense:  90 capsule    Refill:  0   diltiazem (DILACOR XR) 180 MG 24 hr capsule    Sig: Take 1 capsule (180 mg total) by mouth daily.    Dispense:  90 capsule    Refill:  0     Patient Instructions  Medication  Instructions:   Your  physician recommends that you continue on your current medications as directed. Please refer to the Current Medication list given to you today.  *If you need a refill on your cardiac medications before your next appointment, please call your pharmacy*   Lab Work:  None Ordered  If you have labs (blood work) drawn today and your tests are completely normal, you will receive your results only by: MyChart Message (if you have MyChart) OR A paper copy in the mail If you have any lab test that is abnormal or we need to change your treatment, we will call you to review the results.   Testing/Procedures:  None Ordered   Follow-Up: At [facility], you and your health needs are our priority.  As part of our continuing mission to provide you with exceptional heart care, we have created designated Provider Care Teams.  These Care Teams include your primary Cardiologist (physician) and Advanced Practice Providers (APPs -  Physician Assistants and Nurse Practitioners) who all work together to provide you with the care you need, when you need it.  We recommend signing up for the patient portal called "MyChart".  Sign up information is provided on this After Visit Summary.  MyChart is used to connect with patients for Virtual Visits (Telemedicine).  Patients are able to view lab/test results, encounter notes, upcoming appointments, etc.  Non-urgent messages can be sent to your provider as well.   To learn more about what you can do with MyChart, go to [email or web address].    Your next appointment:   6 month(s)  The format for your next appointment:   In Person  Provider:   You may see Debbe Odea, MD or one of the following Advanced Practice Providers on your designated Care Team:   Nicolasa Ducking, NP Eula Listen, PA-C Cadence Fransico Michael, PA-C Charlsie Quest, NP    Signed, Debbe Odea, MD  04/04/2022 9:32 AM    [facility]

## 2022-04-04 NOTE — Patient Instructions (Signed)
Medication Instructions:   Your physician recommends that you continue on your current medications as directed. Please refer to the Current Medication list given to you today.  *If you need a refill on your cardiac medications before your next appointment, please call your pharmacy*   Lab Work:  None Ordered  If you have labs (blood work) drawn today and your tests are completely normal, you will receive your results only by: MyChart Message (if you have MyChart) OR A paper copy in the mail If you have any lab test that is abnormal or we need to change your treatment, we will call you to review the results.   Testing/Procedures:  None Ordered   Follow-Up: At [facility], you and your health needs are our priority.  As part of our continuing mission to provide you with exceptional heart care, we have created designated Provider Care Teams.  These Care Teams include your primary Cardiologist (physician) and Advanced Practice Providers (APPs -  Physician Assistants and Nurse Practitioners) who all work together to provide you with the care you need, when you need it.  We recommend signing up for the patient portal called "MyChart".  Sign up information is provided on this After Visit Summary.  MyChart is used to connect with patients for Virtual Visits (Telemedicine).  Patients are able to view lab/test results, encounter notes, upcoming appointments, etc.  Non-urgent messages can be sent to your provider as well.   To learn more about what you can do with MyChart, go to [email or web address].    Your next appointment:   6 month(s)  The format for your next appointment:   In Person  Provider:   You may see Brian Agbor-Etang, MD or one of the following Advanced Practice Providers on your designated Care Team:   Christopher Berge, NP Ryan Dunn, PA-C Cadence Furth, PA-C Sheri Hammock, NP  

## 2022-04-08 ENCOUNTER — Telehealth: Payer: Self-pay | Admitting: Cardiology

## 2022-04-08 ENCOUNTER — Ambulatory Visit (INDEPENDENT_AMBULATORY_CARE_PROVIDER_SITE_OTHER): Payer: Medicare HMO

## 2022-04-08 DIAGNOSIS — I152 Hypertension secondary to endocrine disorders: Secondary | ICD-10-CM

## 2022-04-08 DIAGNOSIS — I5032 Chronic diastolic (congestive) heart failure: Secondary | ICD-10-CM

## 2022-04-08 DIAGNOSIS — E119 Type 2 diabetes mellitus without complications: Secondary | ICD-10-CM

## 2022-04-08 NOTE — Telephone Encounter (Signed)
Returned patients call -  Patient recently came in to drop off income information for her patient assistance and she wanted to make sure we had a paper shredder to put her tax information after its approved.   Reassured patient that we do have paper shredders and that as soon as I get the determinations I will place it in the shredder.

## 2022-04-08 NOTE — Telephone Encounter (Signed)
Patient stated she would like a call back directly from [place] regarding paperwork she left to be completed.

## 2022-04-09 ENCOUNTER — Ambulatory Visit: Payer: Medicare HMO | Admitting: Urology

## 2022-04-09 ENCOUNTER — Encounter: Payer: Self-pay | Admitting: Urology

## 2022-04-09 VITALS — BP 128/81 | HR 114 | Ht 68.0 in | Wt 258.0 lb

## 2022-04-09 DIAGNOSIS — R319 Hematuria, unspecified: Secondary | ICD-10-CM | POA: Diagnosis not present

## 2022-04-09 LAB — MICROSCOPIC EXAMINATION: RBC, Urine: >30 /hpf — AB (ref 0–2)

## 2022-04-09 LAB — URINALYSIS, COMPLETE
Bilirubin, UA: NEGATIVE
Glucose, UA: NEGATIVE
Leukocytes,UA: NEGATIVE
Nitrite, UA: NEGATIVE
Specific Gravity, UA: 1.02 (ref 1.005–1.030)
Urobilinogen, Ur: 1 mg/dL (ref 0.2–1.0)
pH, UA: 6.5 (ref 5.0–7.5)

## 2022-04-09 NOTE — Patient Instructions (Signed)

## 2022-04-09 NOTE — Plan of Care (Signed)
Chronic Care Management Provider Comprehensive Care Plan     Name: Regina Christensen MRN: [id number] DOB: 1955/12/02  Referral to Chronic Care Management (CCM) services was placed by Provider:  [name], MD on Date: 03/10/22.   Chronic Condition 1: HTN Provider Assessment and Plan  Hypertension associated with type 2 diabetes mellitus (HCC)   - Lipid panel - Microalbumin / creatinine urine ratio - Hemoglobin A1c   Expected Outcome/Goals Addressed This Visit (Provider CCM goals/Provider Assessment and plan  Goal: CCM (Hypertension) Expected Outcome: Monitor, Self-Manage and Reduce Symptoms of Hypertension  Symptom Management Condition 1: Take medications exactly as prescribed   Attend all scheduled provider appointments Call pharmacy for medication refills 3-7 days in advance of running out of medications Call provider office for new concerns or questions  Check blood pressure and keep a log Call doctor for signs and symptoms of high blood pressure Report new symptoms to your doctor Eat whole grains, fruits and vegetables, lean meats and healthy fats Limit salt intake    Chronic Condition 2: DM Provider Assessment and Plan Type 2 diabetes mellitus (HCC) - Lipid panel - Microalbumin / creatinine urine ratio - Hemoglobin A1c    Expected Outcome/Goals Addressed This Visit (Provider CCM goals/Provider Assessment and plan  Goal: CCM (Diabetes) Expected Outcome: Monitor, Self-Manage and Reduce Symptoms of Diabetes  Symptom Management Condition 2: Take medications as prescribed   Attend all scheduled provider appointments Call pharmacy for medication refills 3-7 days in advance of running out of medications Check fasting blood sugars and record readings Keep appointment with eye doctor Check feet daily for cuts, sores or redness Read food labels for fat, fiber, carbohydrates and portion size Call provider office for new concerns or questions    Chronic Condition  3: CHF Provider Assessment and Plan Acute on chronic combined systolic and diastolic CHF (congestive heart failure) (HCC)   Doing better Reviewed DASH diet   Expected Outcome/Goals Addressed This Visit (Provider CCM goals/Provider Assessment and plan  Goal: CCM (Congestive Heart Failure) Expected Outcome: Monitor, Self-Manage and Reduce Symptoms of Congestive Heart Failure  Symptom Management Condition 3: Take medications as prescribed   Attend all scheduled provider appointments Call pharmacy for medication refills 3-7 days in advance of running out of medications Notify provider for weight gain greater than 3 lbs overnight or weight gain greater than 5 lbs within a week.  Call provider office for new concerns or questions    Problem List Patient Active Problem List   Diagnosis Date Noted   Chronic diastolic heart failure (HCC) 03/06/2022   Obesity (BMI 30-39.9) 03/04/2022   HFrEF (heart failure with reduced ejection fraction) (HCC) 03/03/2022   Primary hypertension 03/03/2022   Atrial fibrillation with RVR (HCC) 03/02/2022   Transaminitis 03/02/2022   Dilated cardiomyopathy (HCC) 03/02/2022   Nonrheumatic tricuspid valve regurgitation 03/02/2022   Osteopenia of necks of both femurs 01/22/2022   Bilateral lower extremity edema 09/30/2021   Acute on chronic combined systolic and diastolic CHF (congestive heart failure) (HCC) 09/10/2021   History of total bilateral knee replacement 09/10/2021   History of blood transfusion 09/10/2020   Current use of long term anticoagulation    Diabetes mellitus (HCC)    Paroxysmal atrial fibrillation (HCC)    GERD without esophagitis    Depression    Vitamin D deficiency 04/12/2020   History of CVA (cerebrovascular accident) without residual deficits 03/05/2020   History of hysterectomy 07/19/2019   Anxiety 07/19/2019   MDD (major depressive disorder), recurrent,  in full remission (HCC) 07/19/2019   Insomnia due to mental condition  07/19/2019   Bilateral carpal tunnel syndrome 07/30/2016   OSA (obstructive sleep apnea) 03/07/2016   BPPV (benign paroxysmal positional vertigo) 08/28/2015   Hypertension associated with type 2 diabetes mellitus (HCC) 11/09/2014   GAD (generalized anxiety disorder) 11/09/2014   Acanthosis nigricans 11/09/2014   Morbid obesity (HCC) 01/06/2012    Medication Management  Current Outpatient Medications:    apixaban (ELIQUIS) 5 MG TABS tablet, Take 1 tablet (5 mg total) by mouth 2 (two) times daily., Disp: 180 tablet, Rfl: 3   atorvastatin (LIPITOR) 10 MG tablet, TAKE 1 TABLET BY MOUTH EVERY DAY, Disp: 90 tablet, Rfl: 3   diltiazem (DILACOR XR) 180 MG 24 hr capsule, Take 1 capsule (180 mg total) by mouth daily., Disp: 90 capsule, Rfl: 0   famotidine (PEPCID) 20 MG tablet, Take 1 tablet (20 mg total) by mouth daily as needed for indigestion., Disp: 90 tablet, Rfl: 1   furosemide (LASIX) 40 MG tablet, Take 1 tablet (40 mg total) by mouth daily., Disp: 90 tablet, Rfl: 0   gabapentin (NEURONTIN) 100 MG capsule, Take 100 mg by mouth at bedtime., Disp: , Rfl:    Metoprolol Tartrate 75 MG TABS, Take 150 mg by mouth 2 (two) times daily., Disp: 360 tablet, Rfl: 0   Multiple Vitamin (MULTIVITAMIN) tablet, Take 1 tablet by mouth daily. BariatricPal Multivitamin, Disp: , Rfl:    olmesartan (BENICAR) 20 MG tablet, Take 1 tablet (20 mg total) by mouth daily., Disp: 90 tablet, Rfl: 0   potassium chloride (KLOR-CON M) 10 MEQ tablet, Take 2 tablets (20 mEq total) by mouth daily., Disp: 180 tablet, Rfl: 0   Semaglutide, 2 MG/DOSE, (OZEMPIC, 2 MG/DOSE,) 8 MG/3ML SOPN, Inject 2 mg into the skin once a week., Disp: 3 mL, Rfl: 0   triamcinolone (NASACORT) 55 MCG/ACT AERO nasal inhaler, Place 2 sprays into the nose daily., Disp: , Rfl:    venlafaxine XR (EFFEXOR-XR) 37.5 MG 24 hr capsule, Take 1 capsule (37.5 mg total) by mouth daily with breakfast. Take along with 75 mg daily, Disp: 90 capsule, Rfl: 1   venlafaxine  XR (EFFEXOR-XR) 75 MG 24 hr capsule, Take 1 capsule (75 mg total) by mouth daily with breakfast. Take along with 37.5 mg daily, Disp: 90 capsule, Rfl: 1   Vitamin D, Ergocalciferol, (DRISDOL) 1.25 MG (50000 UNIT) CAPS capsule, Take 1 capsule (50,000 Units total) by mouth every 7 (seven) days., Disp: 12 capsule, Rfl: 1   XIIDRA 5 % SOLN, Place 1 drop into both eyes 2 (two) times daily., Disp: , Rfl: 4  Cognitive Assessment Identity Confirmed: : Name; DOB Cognitive Status: Normal   Functional Assessment Hearing Difficulty or Deaf: no Wear Glasses or Blind: yes Vision Management: Glasses Concentrating, Remembering or Making Decisions Difficulty (CP): no Difficulty Communicating: no Difficulty Eating/Swallowing: no Walking or Climbing Stairs Difficulty: no Dressing/Bathing Difficulty: no Doing Errands Independently Difficulty (such as shopping) (CP): no Change in Functional Status Since Onset of Current Illness/Injury: no   Caregiver Assessment  Primary Source of Support/Comfort: child(ren); extended family Family Caregiver if Needed: none Primary Roles/Responsibilities: wage earner, part-time; caregiver for other(s)   Planned Interventions  HTN Reviewed plan for hypertension management.  Reviewed medications and indications for use. Reports taking all medications as prescribed. Advised to keep provider and care management team updated of concerns related to prescription cost and medication tolerance. Provided information regarding established blood pressure parameters along with indications for notifying a provider.  Reports not monitoring consistently at home but notes recent readings have been within range. Advised to monitor BP a few times a week if unable to monitor daily. Encouraged to keep a BP log. Discussed activity tolerance. Reports completing ADLs and daily activities without difficulty. Denies episodes of chest discomfort or shortness of breath. Reports several episodes of  palpitations over the past week. The Cardiology team was notified. She will follow up for evaluation with Dr. Azucena Cecil on 04/10/22. Discussed compliance with recommended cardiac prudent diet. Encouraged to read nutrition labels, monitor sodium intake, and avoid highly processed foods when possible. Discussed complications of uncontrolled blood pressure.  Reviewed s/sx of heart attack, stroke and worsening symptoms that require immediate medical attention.  DM Reviewed provider's plan for diabetes management. A1C is currently within goal. Reviewed medications. Reports taking as prescribed. Reports tolerating semaglutide 2mg  weekly. Will keep the CCM Pharmacist updated of concerns r/t prescription cost. Provided information regarding importance of consistent blood glucose monitoring. Advised to monitor fasting blood glucose and record readings. Reports not monitoring daily but notes readings have been within range. Reports recent fasting reading of 111 mg/dl.  Reviewed s/sx of hypoglycemia and hyperglycemia along with recommended interventions.  Discussed nutritional intake and importance of complying with a diabetic diet. Attempting to avoid soft drinks and beverages with added sugars. Attempting to monitor intake and adhere to recommended diabetic diet. Thorough discussion regarding food and snack options to optimize glycemic control. Discussed importance of completing recommended DM preventive care. Advised to continue performing daily foot care. Advised to keep appointment for eye exam.  Discussed importance of completing ordered labs as prescribed.  Assessed social determinant of health barriers.  CHF Discussed current plan for CHF management.  Provided information regarding recommended weight parameters. Encouraged to weigh daily and record readings. Encouraged to notify provider for weight gain greater than 3 lbs overnight or weight gain greater than 5 lbs within a week.  Discussed s/sx of  fluid overload and indications for notifying a provider. Encouraged to assess symptoms daily and contact clinic with concerns as needed. Discussed nutritional intake. Advised to closely monitor sodium consumption and avoid highly processed foods when possible. Reviewed worsening s/sx related to CHF exacerbation and indications for seeking immediate medical attention.  Interaction and coordination with outside resources, practitioners, and providers See CCM Referral  Care Plan: Available in MyChart

## 2022-04-09 NOTE — Patient Instructions (Addendum)
Thank you for allowing the Chronic Care Management team to participate in your care. It was great speaking with you!  Our next outreach is scheduled for May 20, 2022 at 4 pm.  Please do not hesitate to call if you require assistance prior to our next scheduled outreach. Please call the care guide team at [phone] if you need to cancel or reschedule your appointment.   Following is a copy of the CCM Program Consent:  CCM service includes personalized support from designated clinical staff supervised by the physician, including individualized plan of care and coordination with other care providers 24/7 contact phone numbers for assistance for urgent and routine care needs. Service will only be billed when office clinical staff spend 20 minutes or more in a month to coordinate care. Only one practitioner may furnish and bill the service in a calendar month. The patient may stop CCM services at amy time (effective at the end of the month) by phone call to the office staff. The patient will be responsible for cost sharing (co-pay) or up to 20% of the service fee (after annual deductible is met)  Following is a copy of your full provider care plan:   Goals Addressed             This Visit's Progress    Goal: CCM (Congestive Heart Failure) Expected Outcome: Monitor, Self-Manage and Reduce Symptoms of Congestive Heart Failure       Current Barriers:  Chronic Disease Management support and education needs related to CHF  Planned Interventions: Discussed current plan for CHF management.  Provided information regarding recommended weight parameters. Encouraged to weigh daily and record readings. Encouraged to notify provider for weight gain greater than 3 lbs overnight or weight gain greater than 5 lbs within a week.  Discussed s/sx of fluid overload and indications for notifying a provider. Encouraged to assess symptoms daily and contact clinic with concerns as needed. Discussed  nutritional intake. Advised to closely monitor sodium consumption and avoid highly processed foods when possible. Reviewed worsening s/sx related to CHF exacerbation and indications for seeking immediate medical attention.   Wt Readings from Last 3 Encounters:  04/04/22 258 lb (117 kg)  03/17/22 260 lb (117.9 kg)  03/10/22 260 lb (117.9 kg)    Symptom Management: Take medications as prescribed   Attend all scheduled provider appointments Call pharmacy for medication refills 3-7 days in advance of running out of medications Notify provider for weight gain greater than 3 lbs overnight or weight gain greater than 5 lbs within a week.  Call provider office for new concerns or questions   Follow Up Plan:   Will follow up next month       Goal: CCM (Diabetes) Expected Outcome: Monitor, Self-Manage and Reduce Symptoms of Diabetes       Current Barriers:  Chronic Disease Management support and education needs related to Diabetes  Planned Interventions: Reviewed provider's plan for diabetes management. A1C is currently within goal. Reviewed medications. Reports taking as prescribed. Reports tolerating semaglutide 66m weekly. Will keep the CCM Pharmacist updated of concerns r/t prescription cost. Provided information regarding importance of consistent blood glucose monitoring. Advised to monitor fasting blood glucose and record readings. Reports not monitoring daily but notes readings have been within range. Reports recent fasting reading of 111 mg/dl.  Reviewed s/sx of hypoglycemia and hyperglycemia along with recommended interventions.  Discussed nutritional intake and importance of complying with a diabetic diet. Attempting to avoid soft drinks and beverages with added sugars.  Attempting to monitor intake and adhere to recommended diabetic diet. Thorough discussion regarding food and snack options to optimize glycemic control. Discussed importance of completing recommended DM preventive care.  Advised to continue performing daily foot care. Advised to keep appointment for eye exam.  Discussed importance of completing ordered labs as prescribed.  Assessed social determinant of health barriers.   Lab Results  Component Value Date   HGBA1C 6.0 (H) 02/10/2022    Symptom Management: Take medications as prescribed   Attend all scheduled provider appointments Call pharmacy for medication refills 3-7 days in advance of running out of medications Check fasting blood sugars and record readings Keep appointment with eye doctor Check feet daily for cuts, sores or redness Read food labels for fat, fiber, carbohydrates and portion size Call provider office for new concerns or questions    Follow Up Plan:  Will follow up next month       Goal: CCM (Hypertension) Expected Outcome: Monitor, Self-Manage and Reduce Symptoms of Hypertension       Current Barriers:  Chronic Disease Management support and education needs related to Hypertension  Planned Interventions: Reviewed plan for hypertension management.  Reviewed medications and indications for use. Reports taking all medications as prescribed. Advised to keep provider and care management team updated of concerns related to prescription cost and medication tolerance. Provided information regarding established blood pressure parameters along with indications for notifying a provider. Reports not monitoring consistently at home but notes recent readings have been within range. Advised to monitor BP a few times a week if unable to monitor daily. Encouraged to keep a BP log. Discussed activity tolerance. Reports completing ADLs and daily activities without difficulty. Denies episodes of chest discomfort or shortness of breath. Reports several episodes of palpitations over the past week. The Cardiology team was notified. She will follow up for evaluation with Dr. Garen Lah on 04/10/22. Discussed compliance with recommended cardiac prudent  diet. Encouraged to read nutrition labels, monitor sodium intake, and avoid highly processed foods when possible. Discussed complications of uncontrolled blood pressure.  Reviewed s/sx of heart attack, stroke and worsening symptoms that require immediate medical attention.   BP Readings from Last 3 Encounters:  04/04/22 134/82  03/17/22 (!) 152/82  03/10/22 132/74    Symptom Management: Take medications exactly as prescribed   Attend all scheduled provider appointments Call pharmacy for medication refills 3-7 days in advance of running out of medications Call provider office for new concerns or questions  Check blood pressure and keep a log Call doctor for signs and symptoms of high blood pressure Report new symptoms to your doctor Eat whole grains, fruits and vegetables, lean meats and healthy fats Limit salt intake   Follow Up Plan:   Will follow up next month                 Ms. Regina Christensen understanding of instructions and care plan provided today and agrees to view in [place].   A member of the care management team will follow up next month    [place] Manager/Chronic Care Management [phone]

## 2022-04-09 NOTE — Chronic Care Management (AMB) (Signed)
Chronic Care Management   CCM RN Visit Note   Name: Regina Christensen MRN: [id number] DOB: Jan 29, 1956  Subjective: Regina Christensen is a 66 y.o. year old female who is a primary care patient of [name], MD. The patient was referred to the Chronic Care Management team for assistance with care management needs subsequent to provider initiation of CCM services and plan of care.    Today's Visit:  Engaged with patient by telephone for initial visit.     SDOH Interventions Today    Flowsheet Row Most Recent Value  SDOH Interventions   Food Insecurity Interventions Intervention Not Indicated  Housing Interventions Intervention Not Indicated  Transportation Interventions Intervention Not Indicated  Utilities Interventions Intervention Not Indicated  Alcohol Usage Interventions Intervention Not Indicated (Score <7)  Stress Interventions Intervention Not Indicated  Social Connections Interventions Intervention Not Indicated         Goals Addressed             This Visit's Progress    Goal: CCM (Congestive Heart Failure) Expected Outcome: Monitor, Self-Manage and Reduce Symptoms of Congestive Heart Failure       Current Barriers:  Chronic Disease Management support and education needs related to CHF  Planned Interventions: Discussed current plan for CHF management.  Provided information regarding recommended weight parameters. Encouraged to weigh daily and record readings. Encouraged to notify provider for weight gain greater than 3 lbs overnight or weight gain greater than 5 lbs within a week.  Discussed s/sx of fluid overload and indications for notifying a provider. Encouraged to assess symptoms daily and contact clinic with concerns as needed. Discussed nutritional intake. Advised to closely monitor sodium consumption and avoid highly processed foods when possible. Reviewed worsening s/sx related to CHF exacerbation and indications for seeking immediate medical  attention.   Wt Readings from Last 3 Encounters:  04/04/22 258 lb (117 kg)  03/17/22 260 lb (117.9 kg)  03/10/22 260 lb (117.9 kg)    Symptom Management: Take medications as prescribed   Attend all scheduled provider appointments Call pharmacy for medication refills 3-7 days in advance of running out of medications Notify provider for weight gain greater than 3 lbs overnight or weight gain greater than 5 lbs within a week.  Call provider office for new concerns or questions   Follow Up Plan:   Will follow up next month       Goal: CCM (Diabetes) Expected Outcome: Monitor, Self-Manage and Reduce Symptoms of Diabetes       Current Barriers:  Chronic Disease Management support and education needs related to Diabetes  Planned Interventions: Reviewed provider's plan for diabetes management. A1C is currently within goal. Reviewed medications. Reports taking as prescribed. Reports tolerating semaglutide 2mg  weekly. Will keep the CCM Pharmacist updated of concerns r/t prescription cost. Provided information regarding importance of consistent blood glucose monitoring. Advised to monitor fasting blood glucose and record readings. Reports not monitoring daily but notes readings have been within range. Reports recent fasting reading of 111 mg/dl.  Reviewed s/sx of hypoglycemia and hyperglycemia along with recommended interventions.  Discussed nutritional intake and importance of complying with a diabetic diet. Attempting to avoid soft drinks and beverages with added sugars. Attempting to monitor intake and adhere to recommended diabetic diet. Thorough discussion regarding food and snack options to optimize glycemic control. Discussed importance of completing recommended DM preventive care. Advised to continue performing daily foot care. Advised to keep appointment for eye exam.  Discussed importance of completing ordered labs  as prescribed.  Assessed social determinant of health barriers.   Lab  Results  Component Value Date   HGBA1C 6.0 (H) 02/10/2022    Symptom Management: Take medications as prescribed   Attend all scheduled provider appointments Call pharmacy for medication refills 3-7 days in advance of running out of medications Check fasting blood sugars and record readings Keep appointment with eye doctor Check feet daily for cuts, sores or redness Read food labels for fat, fiber, carbohydrates and portion size Call provider office for new concerns or questions    Follow Up Plan:  Will follow up next month       Goal: CCM (Hypertension) Expected Outcome: Monitor, Self-Manage and Reduce Symptoms of Hypertension       Current Barriers:  Chronic Disease Management support and education needs related to Hypertension  Planned Interventions: Reviewed plan for hypertension management.  Reviewed medications and indications for use. Reports taking all medications as prescribed. Advised to keep provider and care management team updated of concerns related to prescription cost and medication tolerance. Provided information regarding established blood pressure parameters along with indications for notifying a provider. Reports not monitoring consistently at home but notes recent readings have been within range. Advised to monitor BP a few times a week if unable to monitor daily. Encouraged to keep a BP log. Discussed activity tolerance. Reports completing ADLs and daily activities without difficulty. Denies episodes of chest discomfort or shortness of breath. Reports several episodes of palpitations over the past week. The Cardiology team was notified. She will follow up for evaluation with Dr. Garen Lah on 04/10/22. Discussed compliance with recommended cardiac prudent diet. Encouraged to read nutrition labels, monitor sodium intake, and avoid highly processed foods when possible. Discussed complications of uncontrolled blood pressure.  Reviewed s/sx of heart attack, stroke and  worsening symptoms that require immediate medical attention.   BP Readings from Last 3 Encounters:  04/04/22 134/82  03/17/22 (!) 152/82  03/10/22 132/74    Symptom Management: Take medications exactly as prescribed   Attend all scheduled provider appointments Call pharmacy for medication refills 3-7 days in advance of running out of medications Call provider office for new concerns or questions  Check blood pressure and keep a log Call doctor for signs and symptoms of high blood pressure Report new symptoms to your doctor Eat whole grains, fruits and vegetables, lean meats and healthy fats Limit salt intake   Follow Up Plan:   Will follow up next month                 PLAN: A member of the care management team will follow up next month   [place] Manager/Chronic Care Management [phone]

## 2022-04-09 NOTE — Progress Notes (Signed)
I, Chaya Jan Maxie,acting as a scribe for Vanna Scotland, MD.,have documented all relevant documentation on the behalf of Vanna Scotland, MD,as directed by  Vanna Scotland, MD while in the presence of Vanna Scotland, MD.   04/09/22 4:44 PM   [name] [birth year] [phone]  Referring provider: [name], MD [address] [place],  [place] [address]  Chief Complaint  Patient presents with   New Patient (Initial Visit)   Hematuria    HPI: 65 year-old female referred for further evaluation of hematuria. She had a point in care urinalysis 03/19/22 at her primary care's office with a negative associated urine culture that had a similar appearance.  She has a personal history of Afib with RVR. She's on Eliquis.  She has a normal creatinine. She has not had any recent upper tract imaging.   Today's urinalysis shows greater than 30 red blood cells per high powered field, many bacteria but no leukocytes.   Today she states that she has noticed some blood when wiping but has not noticed lately. She denies burning, urgency, frequency, leakage. She notes her stream isn't as strong as usual at times.   She denies history of kidney stones. She denies flank pain.   PMH: Past Medical History:  Diagnosis Date   Anxiety    Arthritis    Back pain    BPPV (benign paroxysmal positional vertigo)    CHF (congestive heart failure) (HCC)    Chronic diastolic HF (heart failure) (HCC)    Complication of anesthesia    Post-operative hypoxia requiring supplemental oxygen   Current use of long term anticoagulation    Apixaban   CVA (cerebral vascular accident) (HCC) 02/28/2020   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; imaging from [facility]   Depression    Edema of both lower extremities    GERD (gastroesophageal reflux disease)    Hypertension    IBS (irritable bowel syndrome)    Lactose intolerance    Morbid obesity with BMI of 45.0-49.9, adult (HCC)    Obesity     Osteoarthritis    PAF (paroxysmal atrial fibrillation) (HCC)    Pre-diabetes    Sleep apnea    uses CPAP machine sometimes    Type 2 diabetes mellitus without complication (HCC)    Vitamin D deficiency     Surgical History: Past Surgical History:  Procedure Laterality Date   ABDOMINAL HYSTERECTOMY     BREAST EXCISIONAL BIOPSY Left    CARDIOVERSION N/A 03/05/2022   Procedure: CARDIOVERSION;  Surgeon: Debbe Odea, MD;  Location: [facility];  Service: Cardiovascular;  Laterality: N/A;   ENDOMETRIAL ABLATION     x2   ENDOSCOPIC CONCHA BULLOSA RESECTION Bilateral 08/08/2020   Procedure: ENDOSCOPIC CONCHA BULLOSA RESECTION;  Surgeon: Vernie Murders, MD;  Location: [facility];  Service: ENT;  Laterality: Bilateral;   ETHMOIDECTOMY Right 08/08/2020   Procedure: ETHMOIDECTOMY, LEFT PARTIAL ETHMOIDECTOMY;  Surgeon: Vernie Murders, MD;  Location: [facility];  Service: ENT;  Laterality: Right;   IMAGE GUIDED SINUS SURGERY N/A 08/08/2020   Procedure: IMAGE GUIDED SINUS SURGERY, RIGHT FRONTAL SINUSOTOMY;  Surgeon: Vernie Murders, MD;  Location: [facility];  Service: ENT;  Laterality: N/A;   LAPAROSCOPIC SLEEVE GASTRECTOMY     MAXILLARY ANTROSTOMY Bilateral 08/08/2020   Procedure: MAXILLARY ANTROSTOMY with tissue;  Surgeon: Vernie Murders, MD;  Location: [facility];  Service: ENT;  Laterality: Bilateral;   NASAL TURBINATE REDUCTION  08/27/2020   Procedure: TURBINATE REDUCTION /SUBMUCOSAL DEBRIDEMENT;  Surgeon: Elenore Rota,  Eddie Dibbles, MD;  Location: [facility];  Service: ENT;;   TOTAL KNEE ARTHROPLASTY Right 06/2019   TOTAL KNEE ARTHROPLASTY Left 01/31/2021   [facility] Medications:  Allergies as of 04/09/2022       Reactions   Hydrocodone-acetaminophen Other (See Comments)   Extreme headache   Ace Inhibitors Cough   Hctz [hydrochlorothiazide] Rash   face        Medication List        Accurate as of April 09, 2022  4:44 PM. If you have any questions, ask your nurse  or doctor.          apixaban 5 MG Tabs tablet Commonly known as: Eliquis Take 1 tablet (5 mg total) by mouth 2 (two) times daily.   atorvastatin 10 MG tablet Commonly known as: LIPITOR TAKE 1 TABLET BY MOUTH EVERY DAY   diltiazem 180 MG 24 hr capsule Commonly known as: DILACOR XR Take 1 capsule (180 mg total) by mouth daily.   famotidine 20 MG tablet Commonly known as: PEPCID Take 1 tablet (20 mg total) by mouth daily as needed for indigestion.   furosemide 40 MG tablet Commonly known as: LASIX Take 1 tablet (40 mg total) by mouth daily.   gabapentin 100 MG capsule Commonly known as: NEURONTIN Take 100 mg by mouth at bedtime.   Metoprolol Tartrate 75 MG Tabs Take 150 mg by mouth 2 (two) times daily.   multivitamin tablet Take 1 tablet by mouth daily. BariatricPal Multivitamin   olmesartan 20 MG tablet Commonly known as: BENICAR Take 1 tablet (20 mg total) by mouth daily.   Ozempic (2 MG/DOSE) 8 MG/3ML Sopn Generic drug: Semaglutide (2 MG/DOSE) Inject 2 mg into the skin once a week.   potassium chloride 10 MEQ tablet Commonly known as: KLOR-CON M Take 2 tablets (20 mEq total) by mouth daily.   triamcinolone 55 MCG/ACT Aero nasal inhaler Commonly known as: NASACORT Place 2 sprays into the nose daily.   venlafaxine XR 75 MG 24 hr capsule Commonly known as: EFFEXOR-XR Take 1 capsule (75 mg total) by mouth daily with breakfast. Take along with 37.5 mg daily   venlafaxine XR 37.5 MG 24 hr capsule Commonly known as: EFFEXOR-XR Take 1 capsule (37.5 mg total) by mouth daily with breakfast. Take along with 75 mg daily   Vitamin D (Ergocalciferol) 1.25 MG (50000 UNIT) Caps capsule Commonly known as: DRISDOL Take 1 capsule (50,000 Units total) by mouth every 7 (seven) days.   Xiidra 5 % Soln Generic drug: Lifitegrast Place 1 drop into both eyes 2 (two) times daily.        Allergies:  Allergies  Allergen Reactions   Hydrocodone-Acetaminophen Other (See  Comments)    Extreme headache   Ace Inhibitors Cough   Hctz [Hydrochlorothiazide] Rash    face    Family History: Family History  Problem Relation Age of Onset   Diabetes Mother    High blood pressure Mother    Heart disease Father    Cancer Brother    Mental illness Neg Hx     Social History:  reports that she has never smoked. She has never used smokeless tobacco. She reports that she does not drink alcohol and does not use drugs.   Physical Exam: BP 128/81   Pulse (!) 114   Ht 5\' 8"  (1.727 m)   Wt 258 lb (117 kg)   BMI 39.23 kg/m   Constitutional:  Alert and oriented, No acute  distress. HEENT: [place] AT, moist mucus membranes.  Trachea midline, no masses. Neurologic: Grossly intact, no focal deficits, moving all 4 extremities. Psychiatric: Normal mood and affect.  Urinalysis    Component Value Date/Time   COLORURINE YELLOW (A) 02/08/2019 1158   APPEARANCEUR Hazy (A) 04/09/2022 1524   LABSPEC 1.009 02/08/2019 1158   LABSPEC 1.027 07/11/2012 0413   PHURINE 6.0 02/08/2019 1158   GLUCOSEU Negative 04/09/2022 1524   GLUCOSEU Negative 07/11/2012 0413   HGBUR NEGATIVE 02/08/2019 1158   BILIRUBINUR Negative 04/09/2022 1524   BILIRUBINUR Negative 07/11/2012 0413   KETONESUR NEGATIVE 02/08/2019 1158   PROTEINUR 1+ (A) 04/09/2022 1524   PROTEINUR NEGATIVE 02/08/2019 1158   UROBILINOGEN 0.2 03/19/2022 1458   NITRITE Negative 04/09/2022 1524   NITRITE NEGATIVE 02/08/2019 1158   LEUKOCYTESUR Negative 04/09/2022 1524   LEUKOCYTESUR NEGATIVE 02/08/2019 1158   LEUKOCYTESUR Negative 07/11/2012 0413    Lab Results  Component Value Date   LABMICR See below: 04/09/2022   WBCUA 0-5 04/09/2022   LABEPIT 0-10 04/09/2022   MUCUS Present (A) 04/09/2022   BACTERIA Many (A) 04/09/2022    Assessment & Plan:    Microscopic hematuria - High volume, high-risk - We discussed the differential diagnosis for microscopic hematuria including nephrolithiasis, renal or upper tract  tumors, bladder stones, UTIs, or bladder tumors as well as undetermined etiologies. Per AUA guidelines, I did recommend complete microscopic hematuria evaluation including CTU, possible urine cytology, and office cystoscopy.   No follow-ups on file.   [facility] [address] [place], [place] [address] [phone]

## 2022-04-10 ENCOUNTER — Encounter: Payer: Self-pay | Admitting: Cardiology

## 2022-04-10 ENCOUNTER — Ambulatory Visit: Payer: Medicare HMO | Attending: Cardiology | Admitting: Cardiology

## 2022-04-10 VITALS — BP 120/80 | HR 101 | Ht 68.0 in | Wt 258.2 lb

## 2022-04-10 DIAGNOSIS — I480 Paroxysmal atrial fibrillation: Secondary | ICD-10-CM | POA: Diagnosis not present

## 2022-04-10 DIAGNOSIS — Z6839 Body mass index (BMI) 39.0-39.9, adult: Secondary | ICD-10-CM

## 2022-04-10 DIAGNOSIS — I10 Essential (primary) hypertension: Secondary | ICD-10-CM

## 2022-04-10 MED ORDER — FLECAINIDE ACETATE 50 MG PO TABS: 50.0000 mg | ORAL_TABLET | Freq: Two times a day (BID) | ORAL | 0 refills | Status: DC

## 2022-04-10 NOTE — Progress Notes (Signed)
Cardiology Office Note:    Date:  04/10/2022   ID:  Regina Christensen, DOB 1956-01-02, MRN [id number]  PCP:  Steele Sizer, MD  Cardiologist:  Kate Sable, MD  Electrophysiologist:  Vickie Epley, MD   Referring MD: Steele Sizer, MD   Chief Complaint  Patient presents with   Other    C/o AFIB. Meds reviewed verbally with pt.    History of Present Illness:    Regina Christensen is a 66 y.o. female with a hx of hypertension, paroxysmal A. fib on Eliquis, CVA 02/2020 (2/2 not taking eliquis), obesity, sleep apnea who presents for follow-up.  States feeling short of breath the past weekend.  Also felt some flutters in her heart.  Compliant with all medications including Eliquis as prescribed.  She was seen in the office last week, at which point, EKG showed sinus rhythm.   Prior notes Echocardiogram 02/2020 EF 55 to 60%, mildly dilated left atrium  Past Medical History:  Diagnosis Date   Anxiety    Arthritis    Back pain    BPPV (benign paroxysmal positional vertigo)    CHF (congestive heart failure) (HCC)    Chronic diastolic HF (heart failure) (HCC)    Complication of anesthesia    Post-operative hypoxia requiring supplemental oxygen   Current use of long term anticoagulation    Apixaban   CVA (cerebral vascular accident) ([place]) 02/28/2020   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; imaging from [facility]   Depression    Edema of both lower extremities    GERD (gastroesophageal reflux disease)    Hypertension    IBS (irritable bowel syndrome)    Lactose intolerance    Morbid obesity with BMI of 45.0-49.9, adult ([place])    Obesity    Osteoarthritis    PAF (paroxysmal atrial fibrillation) ([place])    Pre-diabetes    Sleep apnea    uses CPAP machine sometimes    Type 2 diabetes mellitus without complication ([place])    Vitamin D deficiency     Past Surgical History:  Procedure Laterality Date   ABDOMINAL HYSTERECTOMY     BREAST EXCISIONAL BIOPSY Left     CARDIOVERSION N/A 03/05/2022   Procedure: CARDIOVERSION;  Surgeon: Kate Sable, MD;  Location: [facility];  Service: Cardiovascular;  Laterality: N/A;   ENDOMETRIAL ABLATION     x2   ENDOSCOPIC CONCHA BULLOSA RESECTION Bilateral 08/08/2020   Procedure: ENDOSCOPIC CONCHA BULLOSA RESECTION;  Surgeon: Margaretha Sheffield, MD;  Location: [facility];  Service: ENT;  Laterality: Bilateral;   ETHMOIDECTOMY Right 08/08/2020   Procedure: ETHMOIDECTOMY, LEFT PARTIAL ETHMOIDECTOMY;  Surgeon: Margaretha Sheffield, MD;  Location: [facility];  Service: ENT;  Laterality: Right;   IMAGE GUIDED SINUS SURGERY N/A 08/08/2020   Procedure: IMAGE GUIDED SINUS SURGERY, RIGHT FRONTAL SINUSOTOMY;  Surgeon: Margaretha Sheffield, MD;  Location: [facility];  Service: ENT;  Laterality: N/A;   LAPAROSCOPIC SLEEVE GASTRECTOMY     MAXILLARY ANTROSTOMY Bilateral 08/08/2020   Procedure: MAXILLARY ANTROSTOMY with tissue;  Surgeon: Margaretha Sheffield, MD;  Location: [facility];  Service: ENT;  Laterality: Bilateral;   NASAL TURBINATE REDUCTION  08/27/2020   Procedure: TURBINATE REDUCTION /SUBMUCOSAL DEBRIDEMENT;  Surgeon: Margaretha Sheffield, MD;  Location: [facility];  Service: ENT;;   TOTAL KNEE ARTHROPLASTY Right 06/2019   TOTAL KNEE ARTHROPLASTY Left 01/31/2021   [facility]    Current Medications: Current Meds  Medication Sig   apixaban (ELIQUIS) 5 MG TABS tablet Take 1 tablet (5 mg  total) by mouth 2 (two) times daily.   atorvastatin (LIPITOR) 10 MG tablet TAKE 1 TABLET BY MOUTH EVERY DAY   diltiazem (DILACOR XR) 180 MG 24 hr capsule Take 1 capsule (180 mg total) by mouth daily.   famotidine (PEPCID) 20 MG tablet Take 1 tablet (20 mg total) by mouth daily as needed for indigestion.   flecainide (TAMBOCOR) 50 MG tablet Take 1 tablet (50 mg total) by mouth 2 (two) times daily.   furosemide (LASIX) 40 MG tablet Take 1 tablet (40 mg total) by mouth daily.   gabapentin (NEURONTIN) 100 MG capsule Take 100 mg by mouth at bedtime.    Metoprolol Tartrate 75 MG TABS Take 150 mg by mouth 2 (two) times daily.   Multiple Vitamin (MULTIVITAMIN) tablet Take 1 tablet by mouth daily. BariatricPal Multivitamin   olmesartan (BENICAR) 20 MG tablet Take 1 tablet (20 mg total) by mouth daily.   potassium chloride (KLOR-CON M) 10 MEQ tablet Take 2 tablets (20 mEq total) by mouth daily.   Semaglutide, 2 MG/DOSE, (OZEMPIC, 2 MG/DOSE,) 8 MG/3ML SOPN Inject 2 mg into the skin once a week.   triamcinolone (NASACORT) 55 MCG/ACT AERO nasal inhaler Place 2 sprays into the nose daily.   venlafaxine XR (EFFEXOR-XR) 37.5 MG 24 hr capsule Take 1 capsule (37.5 mg total) by mouth daily with breakfast. Take along with 75 mg daily   venlafaxine XR (EFFEXOR-XR) 75 MG 24 hr capsule Take 1 capsule (75 mg total) by mouth daily with breakfast. Take along with 37.5 mg daily   Vitamin D, Ergocalciferol, (DRISDOL) 1.25 MG (50000 UNIT) CAPS capsule Take 1 capsule (50,000 Units total) by mouth every 7 (seven) days.   XIIDRA 5 % SOLN Place 1 drop into both eyes 2 (two) times daily.     Allergies:   Hydrocodone-acetaminophen, Ace inhibitors, and Hctz [hydrochlorothiazide]   Social History   Socioeconomic History   Marital status: Divorced    Spouse name: Not on file   Number of children: 2   Years of education: Not on file   Highest education level: Associate degree: occupational, Hotel manager, or vocational program  Occupational History   Occupation: Retired/ work PT    Employer: [employer]  Tobacco Use   Smoking status: Never   Smokeless tobacco: Never  Vaping Use   Vaping Use: Never used  Substance and Sexual Activity   Alcohol use: No    Alcohol/week: 0.0 standard drinks of alcohol    Comment: rare   Drug use: No   Sexual activity: Not on file  Other Topics Concern   Not on file  Social History Narrative   Not on file   Social Determinants of Health   Financial Resource Strain: High Risk (01/22/2022)   Overall Financial Resource Strain (CARDIA)     Difficulty of Paying Living Expenses: Very hard  Food Insecurity: No Food Insecurity (04/08/2022)   Hunger Vital Sign    Worried About Running Out of Food in the Last Year: Never true    Ran Out of Food in the Last Year: Never true  Transportation Needs: No Transportation Needs (04/08/2022)   PRAPARE - Hydrologist (Medical): No    Lack of Transportation (Non-Medical): No  Physical Activity: Insufficiently Active (10/18/2021)   Exercise Vital Sign    Days of Exercise per Week: 3 days    Minutes of Exercise per Session: 10 min  Stress: No Stress Concern Present (04/08/2022)   [place] -  Occupational Stress Questionnaire    Feeling of Stress : Only a little  Social Connections: Moderately Integrated (04/08/2022)   Social Connection and Isolation Panel [NHANES]    Frequency of Communication with Friends and Family: More than three times a week    Frequency of Social Gatherings with Friends and Family: More than three times a week    Attends Religious Services: More than 4 times per year    Active Member of [employer] or Organizations: Yes    Attends Banker Meetings: Never    Marital Status: Divorced     Family History: The patient's family history includes Cancer in her brother; Diabetes in her mother; Heart disease in her father; High blood pressure in her mother. There is no history of Mental illness.  Her sister has atrial fibrillation, multiple nephews have atrial fibrillation.  ROS:   Please see the history of present illness.     All other systems reviewed and are negative.  EKGs/Labs/Other Studies Reviewed:    EKG:  EKG is  ordered today.  The ekg ordered today demonstrates normal sinus rhythm  Recent Labs: 03/02/2022: ALT 75; B Natriuretic Peptide 577.9; Magnesium 2.0; TSH 1.359 03/10/2022: BUN 14; Creat 0.66; Hemoglobin 14.1; Platelets 260; Potassium 4.0; Sodium 140  Recent Lipid Panel    Component  Value Date/Time   CHOL 112 02/10/2022 1209   CHOL 151 12/05/2014 1117   TRIG 79 02/10/2022 1209   HDL 66 02/10/2022 1209   HDL 74 12/05/2014 1117   CHOLHDL 1.7 02/10/2022 1209   VLDL 14 03/01/2020 0306   LDLCALC 30 02/10/2022 1209    Physical Exam:    VS:  BP 120/80 (BP Location: Left Arm, Patient Position: Sitting, Cuff Size: Large)   Pulse (!) 101   Ht 5\' 8"  (1.727 m)   Wt 258 lb 4 oz (117.1 kg)   SpO2 98%   BMI 39.27 kg/m     Wt Readings from Last 3 Encounters:  04/10/22 258 lb 4 oz (117.1 kg)  04/09/22 258 lb (117 kg)  04/04/22 258 lb (117 kg)     GEN:  Well nourished, well developed in no acute distress HEENT: Normal NECK: No JVD; No carotid bruits CARDIAC: Irregular irregular RESPIRATORY:  Clear to auscultation without rales, wheezing or rhonchi  ABDOMEN: Soft, non-tender, distended MUSCULOSKELETAL:  trace edema; No deformity  SKIN: Warm and dry NEUROLOGIC:  Alert and oriented x 3 PSYCHIATRIC:  Normal affect   ASSESSMENT:    1. Paroxysmal atrial fibrillation (HCC)   2. Primary hypertension   3. BMI 39.0-39.9,adult     PLAN:    Paroxysmal atrial fibrillation.  EKG showing A-fib, heart rate 101.  Symptomatic with flutters.  Start flecainide 50 mg twice daily, continue Lopressor 150 mg twice daily, diltiazem XR 180 mg daily, Eliquis.  RFA being planned in about 2 months.  Follow-up with EP prior to ablation for additional input. Hypertension, BP controlled.  Continue Lopressor, diltiazem, Benicar. Obesity, low-calorie diet, weight loss advised.  Continue Ozempic.  Follow-up in 2 to 3 weeks.  Medication Adjustments/Labs and Tests Ordered: Current medicines are reviewed at length with the patient today.  Concerns regarding medicines are outlined above.  Orders Placed This Encounter  Procedures   EKG 12-Lead    Meds ordered this encounter  Medications   flecainide (TAMBOCOR) 50 MG tablet    Sig: Take 1 tablet (50 mg total) by mouth 2 (two) times daily.     Dispense:  180 tablet  Refill:  0     Patient Instructions  Medication Instructions:   START Flecainide - take one tablet (50mg ) by mouth twice a day.   *If you need a refill on your cardiac medications before your next appointment, please call your pharmacy*   Lab Work:  None Ordered  If you have labs (blood work) drawn today and your tests are completely normal, you will receive your results only by: [place] (if you have MyChart) OR A paper copy in the mail If you have any lab test that is abnormal or we need to change your treatment, we will call you to review the results.   Testing/Procedures:  None Ordered    Follow-Up: At [facility], you and your health needs are our priority.  As part of our continuing mission to provide you with exceptional heart care, we have created designated Provider Care Teams.  These Care Teams include your primary Cardiologist (physician) and Advanced Practice Providers (APPs -  Physician Assistants and Nurse Practitioners) who all work together to provide you with the care you need, when you need it.  We recommend signing up for the patient portal called "MyChart".  Sign up information is provided on this After Visit Summary.  MyChart is used to connect with patients for Virtual Visits (Telemedicine).  Patients are able to view lab/test results, encounter notes, upcoming appointments, etc.  Non-urgent messages can be sent to your provider as well.   To learn more about what you can do with MyChart, go to [email or web address].    Your next appointment:    2 week(s) With [facility] Available with Qu[facility] Ore  The format for your next appointment:   In Person  Provider:   You may see Kate Sable, MD or one of the following Advanced Practice Providers on your designated Care Team:   Murray Hodgkins, NP Christell Faith, PA-C Cadence Kathlen Mody, PA-C Gerrie Nordmann, NP    Signed, Kate Sable, MD   04/10/2022 10:33 AM    [place]

## 2022-04-10 NOTE — Patient Instructions (Signed)
Medication Instructions:   START Flecainide - take one tablet (50mg ) by mouth twice a day.   *If you need a refill on your cardiac medications before your next appointment, please call your pharmacy*   Lab Work:  None Ordered  If you have labs (blood work) drawn today and your tests are completely normal, you will receive your results only by: MyChart Message (if you have MyChart) OR A paper copy in the mail If you have any lab test that is abnormal or we need to change your treatment, we will call you to review the results.   Testing/Procedures:  None Ordered    Follow-Up: At [facility], you and your health needs are our priority.  As part of our continuing mission to provide you with exceptional heart care, we have created designated Provider Care Teams.  These Care Teams include your primary Cardiologist (physician) and Advanced Practice Providers (APPs -  Physician Assistants and Nurse Practitioners) who all work together to provide you with the care you need, when you need it.  We recommend signing up for the patient portal called "MyChart".  Sign up information is provided on this After Visit Summary.  MyChart is used to connect with patients for Virtual Visits (Telemedicine).  Patients are able to view lab/test results, encounter notes, upcoming appointments, etc.  Non-urgent messages can be sent to your provider as well.   To learn more about what you can do with MyChart, go to [facility].    Your next appointment:    2 week(s) With [facility] Available with [email or web address]  The format for your next appointment:   In Person  Provider:   You may see Lalla Brothers, MD or one of the following Advanced Practice Providers on your designated Care Team:   Debbe Odea, NP Nicolasa Ducking, PA-C Cadence Eula Listen, PA-C Fransico Michael, NP

## 2022-04-18 ENCOUNTER — Other Ambulatory Visit: Payer: Self-pay | Admitting: Psychiatry

## 2022-04-18 DIAGNOSIS — F3342 Major depressive disorder, recurrent, in full remission: Secondary | ICD-10-CM

## 2022-04-18 DIAGNOSIS — F5101 Primary insomnia: Secondary | ICD-10-CM

## 2022-04-18 DIAGNOSIS — F411 Generalized anxiety disorder: Secondary | ICD-10-CM

## 2022-04-18 MED ORDER — VENLAFAXINE HCL ER 37.5 MG PO CP24: 37.5000 mg | ORAL_CAPSULE | Freq: Every day | ORAL | 1 refills | Status: DC

## 2022-04-18 NOTE — Telephone Encounter (Signed)
I have sent venlafaxine to pharmacy. 

## 2022-04-20 DIAGNOSIS — E1159 Type 2 diabetes mellitus with other circulatory complications: Secondary | ICD-10-CM

## 2022-04-20 DIAGNOSIS — I5030 Unspecified diastolic (congestive) heart failure: Secondary | ICD-10-CM | POA: Diagnosis not present

## 2022-04-20 DIAGNOSIS — I110 Hypertensive heart disease with heart failure: Secondary | ICD-10-CM

## 2022-04-25 ENCOUNTER — Ambulatory Visit: Payer: Medicare HMO | Admitting: Psychiatry

## 2022-04-25 ENCOUNTER — Encounter: Payer: Self-pay | Admitting: Psychiatry

## 2022-04-25 VITALS — BP 150/92 | HR 76 | Temp 97.8°F | Ht 68.0 in | Wt 261.4 lb

## 2022-04-25 DIAGNOSIS — F5101 Primary insomnia: Secondary | ICD-10-CM

## 2022-04-25 DIAGNOSIS — F3342 Major depressive disorder, recurrent, in full remission: Secondary | ICD-10-CM

## 2022-04-25 DIAGNOSIS — R69 Illness, unspecified: Secondary | ICD-10-CM | POA: Diagnosis not present

## 2022-04-25 DIAGNOSIS — F411 Generalized anxiety disorder: Secondary | ICD-10-CM

## 2022-04-25 MED ORDER — VENLAFAXINE HCL ER 75 MG PO CP24: 75.0000 mg | ORAL_CAPSULE | Freq: Every day | ORAL | 1 refills | Status: DC

## 2022-04-25 NOTE — Progress Notes (Signed)
BH MD OP Progress Note  04/25/2022 11:55 AM Regina Christensen  MRN:  [id number]  Chief Complaint:  Chief Complaint  Patient presents with   Follow-up   Anxiety   Depression   Medication Refill   HPI: Regina Christensen is a 67 year old female, retired, lives in [place], has a history of GAD, MDD, hypertension, gastric bypass, hyperlipidemia, paroxysmal atrial fibrillation, OSA, arthritis, total arthroplasty of left knee, insomnia was evaluated in office today.  Patient today reports overall mood wise she is doing well.  She however does report situational anxiety mostly regarding her upcoming procedure for atrial fibrillation.  She is currently under the care of cardiology.  Reports recent changes in her medications, addition of Flecainide.  Also reports other medical problems, she has had blood in the urine and is currently undergoing evaluation for the same.  Denies any urinary symptoms other than that.  Patient reports sleep as good.  Reports appetite is fair.  Patient reports she had a good holiday season with family.  Denies any suicidality, homicidality or perceptual disturbances.  Compliant on the venlafaxine, denies side effects.  Appeared to be alert, oriented to person place time situation.  3 word memory immediate 3 out of 3, after 5 minutes 3 out of 3.  Patient denies any other concerns today.    Visit Diagnosis:    ICD-10-CM   1. GAD (generalized anxiety disorder)  F41.1 venlafaxine XR (EFFEXOR-XR) 75 MG 24 hr capsule    2. MDD (major depressive disorder), recurrent, in full remission (HCC)  F33.42 venlafaxine XR (EFFEXOR-XR) 75 MG 24 hr capsule    3. Primary insomnia  F51.01       Past Psychiatric History: Reviewed past psychiatric history from progress note on 01/27/2019.  Past trials of Prozac, Effexor, trazodone, Rozerem.  Past Medical History:  Past Medical History:  Diagnosis Date   Anxiety    Arthritis    Back pain    BPPV (benign paroxysmal  positional vertigo)    CHF (congestive heart failure) (HCC)    Chronic diastolic HF (heart failure) (HCC)    Complication of anesthesia    Post-operative hypoxia requiring supplemental oxygen   Current use of long term anticoagulation    Apixaban   CVA (cerebral vascular accident) (HCC) 02/28/2020   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; imaging from [facility]   Depression    Edema of both lower extremities    GERD (gastroesophageal reflux disease)    Hypertension    IBS (irritable bowel syndrome)    Lactose intolerance    Morbid obesity with BMI of 45.0-49.9, adult (HCC)    Obesity    Osteoarthritis    PAF (paroxysmal atrial fibrillation) (HCC)    Pre-diabetes    Sleep apnea    uses CPAP machine sometimes    Type 2 diabetes mellitus without complication (HCC)    Vitamin D deficiency     Past Surgical History:  Procedure Laterality Date   ABDOMINAL HYSTERECTOMY     BREAST EXCISIONAL BIOPSY Left    CARDIOVERSION N/A 03/05/2022   Procedure: CARDIOVERSION;  Surgeon: Debbe Odea, MD;  Location: [facility];  Service: Cardiovascular;  Laterality: N/A;   ENDOMETRIAL ABLATION     x2   ENDOSCOPIC CONCHA BULLOSA RESECTION Bilateral 08/08/2020   Procedure: ENDOSCOPIC CONCHA BULLOSA RESECTION;  Surgeon: Vernie Murders, MD;  Location: [facility];  Service: ENT;  Laterality: Bilateral;   ETHMOIDECTOMY Right 08/08/2020   Procedure: ETHMOIDECTOMY, LEFT PARTIAL ETHMOIDECTOMY;  Surgeon: Vernie Murders,  MD;  Location: [facility];  Service: ENT;  Laterality: Right;   IMAGE GUIDED SINUS SURGERY N/A 08/08/2020   Procedure: IMAGE GUIDED SINUS SURGERY, RIGHT FRONTAL SINUSOTOMY;  Surgeon: Margaretha Sheffield, MD;  Location: [facility];  Service: ENT;  Laterality: N/A;   LAPAROSCOPIC SLEEVE GASTRECTOMY     MAXILLARY ANTROSTOMY Bilateral 08/08/2020   Procedure: MAXILLARY ANTROSTOMY with tissue;  Surgeon: Margaretha Sheffield, MD;  Location: [facility];  Service: ENT;  Laterality: Bilateral;   NASAL TURBINATE REDUCTION   08/27/2020   Procedure: TURBINATE REDUCTION /SUBMUCOSAL DEBRIDEMENT;  Surgeon: Margaretha Sheffield, MD;  Location: [facility];  Service: ENT;;   TOTAL KNEE ARTHROPLASTY Right 06/2019   TOTAL KNEE ARTHROPLASTY Left 01/31/2021   [facility]    Family Psychiatric History: Reviewed family psychiatric history from progress note on 01/27/2019.  Family History:  Family History  Problem Relation Age of Onset   Diabetes Mother    High blood pressure Mother    Heart disease Father    Cancer Brother    Mental illness Neg Hx     Social History: Reviewed social history from progress note on 01/27/2019. Social History   Socioeconomic History   Marital status: Divorced    Spouse name: Not on file   Number of children: 2   Years of education: Not on file   Highest education level: Associate degree: occupational, Hotel manager, or vocational program  Occupational History   Occupation: Retired/ work PT    Employer: [employer]  Tobacco Use   Smoking status: Never   Smokeless tobacco: Never  Vaping Use   Vaping Use: Never used  Substance and Sexual Activity   Alcohol use: No    Alcohol/week: 0.0 standard drinks of alcohol    Comment: rare   Drug use: No   Sexual activity: Not on file  Other Topics Concern   Not on file  Social History Narrative   Not on file   Social Determinants of Health   Financial Resource Strain: High Risk (01/22/2022)   Overall Financial Resource Strain (CARDIA)    Difficulty of Paying Living Expenses: Very hard  Food Insecurity: No Food Insecurity (04/08/2022)   Hunger Vital Sign    Worried About Running Out of Food in the Last Year: Never true    Ran Out of Food in the Last Year: Never true  Transportation Needs: No Transportation Needs (04/08/2022)   PRAPARE - Hydrologist (Medical): No    Lack of Transportation (Non-Medical): No  Physical Activity: Insufficiently Active (10/18/2021)   Exercise Vital Sign    Days of  Exercise per Week: 3 days    Minutes of Exercise per Session: 10 min  Stress: No Stress Concern Present (04/08/2022)   [place]    Feeling of Stress : Only a little  Social Connections: Moderately Integrated (04/08/2022)   Social Connection and Isolation Panel [NHANES]    Frequency of Communication with Friends and Family: More than three times a week    Frequency of Social Gatherings with Friends and Family: More than three times a week    Attends Religious Services: More than 4 times per year    Active Member of [employer] or Organizations: Yes    Attends Archivist Meetings: Never    Marital Status: Divorced    Allergies:  Allergies  Allergen Reactions   Hydrocodone-Acetaminophen Other (See Comments)    Extreme headache   Ace Inhibitors Cough  Hctz [Hydrochlorothiazide] Rash    face    Metabolic Disorder Labs: Lab Results  Component Value Date   HGBA1C 6.0 (H) 02/10/2022   MPG 126 02/10/2022   MPG 125.5 08/16/2020   No results found for: "PROLACTIN" Lab Results  Component Value Date   CHOL 112 02/10/2022   TRIG 79 02/10/2022   HDL 66 02/10/2022   CHOLHDL 1.7 02/10/2022   VLDL 14 03/01/2020   LDLCALC 30 02/10/2022   LDLCALC 46 03/13/2021   Lab Results  Component Value Date   TSH 1.359 03/02/2022   TSH 1.250 11/14/2020    Therapeutic Level Labs: No results found for: "LITHIUM" No results found for: "VALPROATE" No results found for: "CBMZ"  Current Medications: Current Outpatient Medications  Medication Sig Dispense Refill   apixaban (ELIQUIS) 5 MG TABS tablet Take 1 tablet (5 mg total) by mouth 2 (two) times daily. 180 tablet 3   atorvastatin (LIPITOR) 10 MG tablet TAKE 1 TABLET BY MOUTH EVERY DAY 90 tablet 3   famotidine (PEPCID) 20 MG tablet Take 1 tablet (20 mg total) by mouth daily as needed for indigestion. 90 tablet 1   flecainide (TAMBOCOR) 50 MG tablet Take 1 tablet (50 mg  total) by mouth 2 (two) times daily. 180 tablet 0   furosemide (LASIX) 40 MG tablet Take 1 tablet (40 mg total) by mouth daily. 90 tablet 0   gabapentin (NEURONTIN) 100 MG capsule Take 100 mg by mouth at bedtime.     Metoprolol Tartrate 75 MG TABS Take 150 mg by mouth 2 (two) times daily. 360 tablet 0   Multiple Vitamin (MULTIVITAMIN) tablet Take 1 tablet by mouth daily. BariatricPal Multivitamin     olmesartan (BENICAR) 20 MG tablet Take 1 tablet (20 mg total) by mouth daily. 90 tablet 0   potassium chloride (KLOR-CON M) 10 MEQ tablet Take 2 tablets (20 mEq total) by mouth daily. 180 tablet 0   Semaglutide, 2 MG/DOSE, (OZEMPIC, 2 MG/DOSE,) 8 MG/3ML SOPN Inject 2 mg into the skin once a week. 3 mL 0   triamcinolone (NASACORT) 55 MCG/ACT AERO nasal inhaler Place 2 sprays into the nose daily.     Vitamin D, Ergocalciferol, (DRISDOL) 1.25 MG (50000 UNIT) CAPS capsule Take 1 capsule (50,000 Units total) by mouth every 7 (seven) days. 12 capsule 1   XIIDRA 5 % SOLN Place 1 drop into both eyes 2 (two) times daily.  4   diltiazem (DILACOR XR) 180 MG 24 hr capsule Take 1 capsule (180 mg total) by mouth daily. (Patient not taking: Reported on 04/25/2022) 90 capsule 0   venlafaxine XR (EFFEXOR-XR) 75 MG 24 hr capsule Take 1 capsule (75 mg total) by mouth daily with breakfast. 90 capsule 1   No current facility-administered medications for this visit.     Musculoskeletal: Strength & Muscle Tone: within normal limits Gait & Station: normal Patient leans: N/A  Psychiatric Specialty Exam: Review of Systems  Genitourinary:  Positive for hematuria.  Psychiatric/Behavioral:  The patient is nervous/anxious.   All other systems reviewed and are negative.   Blood pressure (!) 150/92, pulse 76, temperature 97.8 F (36.6 C), temperature source Oral, height 5\' 8"  (1.727 m), weight 261 lb 6.4 oz (118.6 kg), SpO2 97 %.Body mass index is 39.75 kg/m.  General Appearance: Casual  Eye Contact:  Fair  Speech:   Clear and Coherent  Volume:  Normal  Mood:  Anxious situational  Affect:  Congruent  Thought Process:  Goal Directed and Descriptions of Associations: Intact  Orientation:  Full (Time, Place, and Person)  Thought Content: Logical   Suicidal Thoughts:  No  Homicidal Thoughts:  No  Memory:  Immediate;   Fair Recent;   Fair Remote;   Fair  Judgement:  Intact  Insight:  Fair  Psychomotor Activity:  Normal  Concentration:  Concentration: Fair and Attention Span: Fair  Recall:  [employer] of Knowledge: Fair  Language: Fair  Akathisia:  No  Handed:  Right  AIMS (if indicated): not done  Assets:  Communication Skills Desire for Improvement Housing Social Support  ADL's:  Intact  Cognition: WNL  Sleep:  Fair   Screenings: AIMS    Flowsheet Row Video Visit from 09/27/2021 in [place] Total Score 0      [place] Office Visit from 04/25/2022 in [place] Office Visit from 02/10/2022 in [facility] Video Visit from 09/27/2021 in [place] Video Visit from 02/13/2021 in [place] Video Visit from 11/12/2020 in [place]  Total GAD-7 Score 2 1 2  0 3      PHQ2-9    [place] Visit from 04/25/2022 in [place] [facility] from 04/08/2022 in [facility] Video Visit from 03/19/2022 in [facility] Office Visit from 03/10/2022 in [facility] Video Visit from 02/12/2022 in [facility]  PHQ-2 Total Score 0 0 0 0 0  PHQ-9 Total Score -- -- -- 0 --      [place] Office Visit from 04/25/2022 in [place] [facility] to Hosp-Admission (Discharged) from 03/02/2022 in [place] [facility] Video Visit from 09/27/2021 in [place] No Risk No Risk No Risk        Assessment and Plan: Regina Christensen is a 67 year old African-American female, divorced, retired, lives in [place], has a history of anxiety, depression, insomnia, multiple medical problems including paroxysmal atrial fibrillation was evaluated in office today.  Patient is currently stable on current medication regimen.  Patient currently on venlafaxine, agreeable to tapering down the dosage as noted above.  Plan as noted below.  Plan  GAD-stable Reduce venlafaxine extended release to 75 mg p.o. daily  MDD in remission Venlafaxine extended release 75 mg p.o. daily.  Reduced dosage.  Insomnia-stable Continue sleep hygiene techniques Patient also has hydroxyzine 12.5-25 mg as needed.  Discussed with patient drug to drug interaction between venlafaxine and flecainide.  Patient to discuss with cardiology.  Will coordinate care. Venlafaxine: (Major) Concomitant use of flecainide and venlafaxine increases the risk of QT/QTc prolongation and torsade de pointes (TdP). Avoid concomitant use if possible, especially in patients with additional risk factors for TdP. Consider taking steps to minimize the risk for QT/QTc interval prolongation and TdP, such as electrolyte monitoring and repletion and ECG monitoring, if concomitant use is necessary from clinicalkey"  Most recent EKG reviewed with patient-04/10/2022-QTc-411. Will coordinate care with cardiology.  Patient has upcoming appointment.   Follow-up in clinic in 4 months or sooner if needed. This note was generated in part or whole with voice recognition software. Voice recognition is usually quite accurate but there are transcription errors that can and very often do occur. I apologize for any typographical errors that were not detected and corrected.    Ursula Alert, MD 04/25/2022, 11:55 AM

## 2022-04-26 NOTE — Progress Notes (Unsigned)
Patient ID: Regina Christensen, female    DOB: 01/13/1956, 67 y.o.   MRN: [id number]  HPI  Regina Christensen is a 67 y/o female with a history of DM, HTN, stroke, back pain, anxiety, depression, GERD, IBS, PAF, sleep apnea and chronic heart failure.   Echo report from 03/02/22 reviewed and showed an EF of 40-45% along with mild LVH, moderate LAE, mildy elevated PA pressure of 41.6 mmHg, mild/moderate MR and moderate/  severe TR.   Admitted 03/02/22 due to palpitations and SOB and found to be in AF RVR.Cardiology consult obtained. Cardizem gtt started. Cardioversion completed. Hypokalemia corrected. Discharged after 4 days.   She presents today for a follow-upl visit with a chief complaint of moderate fatigue with exertion. Describes this as chronic in nature. Has associated shortness of breath, pedal edema, weakness, chronic back pain and slight weight gain along with this. Denies any difficulty sleeping, dizziness, abdominal distention, palpitations, chest pain, wheezing or cough.   Scheduled for ablation early February.   Not adding salt to her food but does tend to eat out on the weekends. Yesterday she ate a hamburger from a restaurant.   Past Medical History:  Diagnosis Date   Anxiety    Arthritis    Back pain    BPPV (benign paroxysmal positional vertigo)    CHF (congestive heart failure) (HCC)    Chronic diastolic HF (heart failure) (HCC)    Complication of anesthesia    Post-operative hypoxia requiring supplemental oxygen   Current use of long term anticoagulation    Apixaban   CVA (cerebral vascular accident) ([place]) 02/28/2020   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; imaging from [facility]   Depression    Edema of both lower extremities    GERD (gastroesophageal reflux disease)    Hypertension    IBS (irritable bowel syndrome)    Lactose intolerance    Morbid obesity with BMI of 45.0-49.9, adult ([place])    Obesity    Osteoarthritis    PAF (paroxysmal atrial fibrillation) ([place])     Pre-diabetes    Sleep apnea    uses CPAP machine sometimes    Type 2 diabetes mellitus without complication ([place])    Vitamin D deficiency    Past Surgical History:  Procedure Laterality Date   ABDOMINAL HYSTERECTOMY     BREAST EXCISIONAL BIOPSY Left    CARDIOVERSION N/A 03/05/2022   Procedure: CARDIOVERSION;  Surgeon: Kate Sable, MD;  Location: [facility] OR[facility];  Service: Cardiovascular;  Laterality: N/A;   ENDOMETRIAL ABLATION     x2   ENDOSCOPIC CONCHA BULLOSA RESECTION Bilateral 08/08/2020   Procedure: ENDOSCOPIC CONCHA BULLOSA RESECTION;  Surgeon: Margaretha Sheffield, MD;  Location: [facility] OR[facility];  Service: ENT;  Laterality: Bilateral;   ETHMOIDECTOMY Right 08/08/2020   Procedure: ETHMOIDECTOMY, LEFT PARTIAL ETHMOIDECTOMY;  Surgeon: Margaretha Sheffield, MD;  Location: [facility];  Service: ENT;  Laterality: Right;   IMAGE GUIDED SINUS SURGERY N/A 08/08/2020   Procedure: IMAGE GUIDED SINUS SURGERY, RIGHT FRONTAL SINUSOTOMY;  Surgeon: Margaretha Sheffield, MD;  Location: [facility];  Service: ENT;  Laterality: N/A;   LAPAROSCOPIC SLEEVE GASTRECTOMY     MAXILLARY ANTROSTOMY Bilateral 08/08/2020   Procedure: MAXILLARY ANTROSTOMY with tissue;  Surgeon: Margaretha Sheffield, MD;  Location: [facility];  Service: ENT;  Laterality: Bilateral;   NASAL TURBINATE REDUCTION  08/27/2020   Procedure: TURBINATE REDUCTION /SUBMUCOSAL DEBRIDEMENT;  Surgeon: Margaretha Sheffield, MD;  Location: [facility];  Service: ENT;;   TOTAL KNEE ARTHROPLASTY Right 06/2019   TOTAL  KNEE ARTHROPLASTY Left 01/31/2021   [facility]   Family History  Problem Relation Age of Onset   Diabetes Mother    High blood pressure Mother    Heart disease Father    Cancer Brother    Mental illness Neg Hx    Social History   Tobacco Use   Smoking status: Never   Smokeless tobacco: Never  Substance Use Topics   Alcohol use: No    Alcohol/week: 0.0 standard drinks of alcohol    Comment: rare   Allergies  Allergen Reactions    Hydrocodone-Acetaminophen Other (See Comments)    Extreme headache   Ace Inhibitors Cough   Hctz [Hydrochlorothiazide] Rash    face   Prior to Admission medications   Medication Sig Start Date End Date Taking? Authorizing Provider  apixaban (ELIQUIS) 5 MG TABS tablet Take 1 tablet (5 mg total) by mouth 2 (two) times daily. 04/04/22  Yes Agbor-Etang, Aaron Edelman, MD  atorvastatin (LIPITOR) 10 MG tablet TAKE 1 TABLET BY MOUTH EVERY DAY 03/09/22  Yes Sowles, Drue Stager, MD  diltiazem (DILACOR XR) 180 MG 24 hr capsule Take 1 capsule (180 mg total) by mouth daily. 04/04/22  Yes Kate Sable, MD  famotidine (PEPCID) 20 MG tablet Take 1 tablet (20 mg total) by mouth daily as needed for indigestion. 01/22/22  Yes Sowles, Drue Stager, MD  flecainide (TAMBOCOR) 50 MG tablet Take 1 tablet (50 mg total) by mouth 2 (two) times daily. 04/10/22  Yes Agbor-Etang, Aaron Edelman, MD  furosemide (LASIX) 40 MG tablet Take 1 tablet (40 mg total) by mouth daily. 03/31/22  Yes Sowles, Drue Stager, MD  gabapentin (NEURONTIN) 100 MG capsule Take 100 mg by mouth at bedtime. 12/20/21  Yes [provider]  Metoprolol Tartrate 75 MG TABS Take 150 mg by mouth 2 (two) times daily. 03/31/22 04/30/22 Yes Sowles, Drue Stager, MD  Multiple Vitamin (MULTIVITAMIN) tablet Take 1 tablet by mouth daily. BariatricPal Multivitamin   Yes [provider]  olmesartan (BENICAR) 20 MG tablet Take 1 tablet (20 mg total) by mouth daily. 03/31/22  Yes Sowles, Drue Stager, MD  potassium chloride (KLOR-CON M) 10 MEQ tablet Take 2 tablets (20 mEq total) by mouth daily. 03/31/22  Yes Sowles, Drue Stager, MD  Semaglutide, 2 MG/DOSE, (OZEMPIC, 2 MG/DOSE,) 8 MG/3ML SOPN Inject 2 mg into the skin once a week. Patient taking differently: Inject 2 mg into the skin once a week. Take once weekly on Mondays 11/14/21  Yes Danford, Valetta Fuller D, NP  triamcinolone (NASACORT) 55 MCG/ACT AERO nasal inhaler Place 2 sprays into the nose daily.   Yes [provider]  venlafaxine  XR (EFFEXOR-XR) 75 MG 24 hr capsule Take 1 capsule (75 mg total) by mouth daily with breakfast. 04/25/22  Yes Eappen, Saramma, MD  Vitamin D, Ergocalciferol, (DRISDOL) 1.25 MG (50000 UNIT) CAPS capsule Take 1 capsule (50,000 Units total) by mouth every 7 (seven) days. Patient taking differently: Take 50,000 Units by mouth every 7 (seven) days. Take weekly on Monday. 03/31/22  Yes Steele Sizer, MD    Review of Systems  Constitutional:  Positive for fatigue (with exertion). Negative for appetite change.  HENT:  Negative for congestion, postnasal drip and sore throat.   Eyes: Negative.   Respiratory:  Positive for shortness of breath (minimal). Negative for cough, chest tightness and wheezing.   Cardiovascular:  Positive for leg swelling. Negative for chest pain and palpitations.  Gastrointestinal:  Negative for abdominal distention and abdominal pain.  Endocrine: Negative.   Genitourinary: Negative.  Musculoskeletal:  Positive for back pain.  Skin: Negative.   Allergic/Immunologic: Negative.   Neurological:  Positive for weakness (legs). Negative for dizziness and light-headedness.  Hematological:  Negative for adenopathy. Does not bruise/bleed easily.  Psychiatric/Behavioral:  Negative for dysphoric mood and sleep disturbance (sleeping on 1-2 pillows). The patient is not nervous/anxious.    Vitals:   04/28/22 0834  BP: (!) 145/73  Pulse: 70  Resp: 18  SpO2: 100%  Weight: 262 lb (118.8 kg)   Wt Readings from Last 3 Encounters:  04/28/22 262 lb (118.8 kg)  04/10/22 258 lb 4 oz (117.1 kg)  04/09/22 258 lb (117 kg)   Lab Results  Component Value Date   CREATININE 0.66 03/10/2022   CREATININE 0.85 03/05/2022   CREATININE 0.74 03/04/2022   Physical Exam Vitals and nursing note reviewed.  Constitutional:      Appearance: Normal appearance.  HENT:     Head: Normocephalic and atraumatic.  Cardiovascular:     Rate and Rhythm: Normal rate. Rhythm irregular.     Heart sounds:  Murmur (II/VI) heard.  Pulmonary:     Effort: Pulmonary effort is normal. No respiratory distress.     Breath sounds: No wheezing or rales.  Abdominal:     General: There is no distension.     Palpations: Abdomen is soft.     Tenderness: There is no abdominal tenderness.  Musculoskeletal:        General: No tenderness.     Cervical back: Normal range of motion and neck supple.     Right lower leg: Edema (1+ pitting) present.     Left lower leg: Edema (1+ pitting) present.  Skin:    General: Skin is warm and dry.  Neurological:     General: No focal deficit present.     Mental Status: She is alert and oriented to person, place, and time.  Psychiatric:        Mood and Affect: Mood normal.        Behavior: Behavior normal.        Thought Content: Thought content normal.    Assessment & Plan:  1: Chronic heart failure with reduced ejection fraction- - NYHA class III - euvolemic today - weighing daily; reminded to call for an overnight weight gain of > 2 pounds or a weekly weight gain of > 5 pounds - weight up 2 pounds from last visit here 6 weeks ago - not adding salt and reading food labels to keep daily sodium intake to < 2000mg   - on GDMT of benicar and metoprolol (although tartrate) - will add spironolactone 12.5mg  daily - BMP today and will call her after results to advise of whether to continue K+ supplementation or not - will also get BMP next week and then again in 1 month - discuss changing olmesartan to entresto at next visit - saw cardiology (Agbor-Etang) 04/10/22; returns tomorrow - BNP 03/02/22 was 577.9 - has received her flu vaccine for this season - PharmD reconciled medications w/patient  2: HTN- - BP 145/73 - had video visit with PCP 13/12/23) 03/19/22 - BMP 03/10/22 reviewed and showed sodium 140, potassium 4.0, creatinine 0.66 & GFR 97  3: Atrial fibrillation- - saw EP 03/12/22) 02/19/22 - has had previous cardioversion - has ablation scheduled for  05/29/22 - currently on apixaban, diltiazem, flecainide and metoprolol tartrate  4: DM- - A1c 02/10/22 was 6.0% - currently using ozempic - home glucose today was 115  5: Lymphedema- - wearing compression socks  daily but edema persist - encouraged to elevate legs when sitting for long periods of time - discussed compression boots and  brochure provided; she will think about them and we can re-visit this conversation at her next visit - limited in her ability to exercise due to her symptoms   Medication bottles reviewed.   Return in 1 month, sooner if needed.

## 2022-04-28 ENCOUNTER — Other Ambulatory Visit (HOSPITAL_COMMUNITY): Payer: Self-pay

## 2022-04-28 ENCOUNTER — Other Ambulatory Visit
Admission: RE | Admit: 2022-04-28 | Discharge: 2022-04-28 | Disposition: A | Discharge status: Home / Self Care | Payer: Medicare HMO | Source: Ambulatory Visit | Attending: Family | Admitting: Family

## 2022-04-28 ENCOUNTER — Telehealth: Payer: Self-pay | Admitting: Family

## 2022-04-28 ENCOUNTER — Ambulatory Visit (HOSPITAL_BASED_OUTPATIENT_CLINIC_OR_DEPARTMENT_OTHER): Payer: Medicare HMO | Admitting: Family

## 2022-04-28 ENCOUNTER — Encounter: Payer: Self-pay | Admitting: Family

## 2022-04-28 ENCOUNTER — Other Ambulatory Visit: Payer: Self-pay | Admitting: Family

## 2022-04-28 VITALS — BP 145/73 | HR 70 | Resp 18 | Wt 262.0 lb

## 2022-04-28 DIAGNOSIS — I5032 Chronic diastolic (congestive) heart failure: Secondary | ICD-10-CM

## 2022-04-28 DIAGNOSIS — I480 Paroxysmal atrial fibrillation: Secondary | ICD-10-CM

## 2022-04-28 DIAGNOSIS — I10 Essential (primary) hypertension: Secondary | ICD-10-CM

## 2022-04-28 DIAGNOSIS — I890 Lymphedema, not elsewhere classified: Secondary | ICD-10-CM | POA: Diagnosis not present

## 2022-04-28 DIAGNOSIS — E119 Type 2 diabetes mellitus without complications: Secondary | ICD-10-CM | POA: Diagnosis not present

## 2022-04-28 LAB — BASIC METABOLIC PANEL
Anion gap: 9 (ref 5–15)
BUN: 12 mg/dL (ref 8–23)
CO2: 28 mmol/L (ref 22–32)
Calcium: 9.1 mg/dL (ref 8.9–10.3)
Chloride: 102 mmol/L (ref 98–111)
Creatinine, Ser: 0.72 mg/dL (ref 0.44–1.00)
GFR, Estimated: >60 mL/min (ref >60)
Glucose, Bld: 109 mg/dL — ABNORMAL HIGH (ref 70–99)
Potassium: 3.1 mmol/L — ABNORMAL LOW (ref 3.5–5.1)
Sodium: 139 mmol/L (ref 135–145)

## 2022-04-28 MED ORDER — SPIRONOLACTONE 25 MG PO TABS: 12.5000 mg | ORAL_TABLET | Freq: Every day | ORAL | 3 refills | Status: DC

## 2022-04-28 NOTE — Telephone Encounter (Signed)
LM for patient to begin taking 12.5mg  spironolactone as previously discussed and to continue taking her potassium supplements. Will recheck BMP next week on 05/08/22.

## 2022-04-28 NOTE — Progress Notes (Signed)
[facility] - PHARMACIST COUNSELING NOTE  Guideline-Directed Medical Therapy/Evidence Based Medicine  ACE/ARB/ARNI:  Olmesartan 20 mg daily Beta Blocker: Metoprolol tartrate 75 mg twice daily Aldosterone Antagonist:  None Diuretic: Furosemide 40 mg daily SGLT2i:  None  Adherence Assessment  Do you ever forget to take your medication? [] Yes [x] No  Do you ever skip doses due to side effects? [] Yes [x] No  Do you have trouble affording your medicines? [x] Yes [] No  Are you ever unable to pick up your medication due to transportation difficulties? [] Yes [x] No  Do you ever stop taking your medications because you don't believe they are helping? [] Yes [x] No  Do you check your weight daily? [x] Yes [] No   Adherence strategy: Patient states she is fairly compliant with taking her medications, but sometimes forgets to take the potassium supplements.   Barriers to obtaining medications: Cost of apixaban when patient is in the donut hole. If starting entresto, cost may become an issue.   Vital signs: HR 70, BP 145/73, weight (pounds) 262 ECHO: Date 02/2022, EF 40-45, notes There is mildly elevated pulmonary artery systolic pressure. There is mild left ventricular hypertrophy. EF decreased from 55-60 on 02/2020.      Latest Ref Rng & Units 03/10/2022    8:22 AM 03/05/2022    6:08 AM 03/04/2022    6:04 AM  BMP  Glucose 65 - 99 mg/dL 96  116  126   BUN 7 - 25 mg/dL 14  18  19    Creatinine 0.50 - 1.05 mg/dL 0.66  0.85  0.74   BUN/Creat Ratio 6 - 22 (calc) SEE NOTE:     Sodium 135 - 146 mmol/L 140  142  138   Potassium 3.5 - 5.3 mmol/L 4.0  4.0  3.6   Chloride 98 - 110 mmol/L 104  110  107   CO2 20 - 32 mmol/L 27  25  22    Calcium 8.6 - 10.4 mg/dL 9.4  8.9  8.8     Past Medical History:  Diagnosis Date   Anxiety    Arthritis    Back pain    BPPV (benign paroxysmal positional vertigo)    CHF (congestive heart failure) (HCC)    Chronic  diastolic HF (heart failure) (HCC)    Complication of anesthesia    Post-operative hypoxia requiring supplemental oxygen   Current use of long term anticoagulation    Apixaban   CVA (cerebral vascular accident) ([place]) 02/28/2020   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; imaging from [facility]   Depression    Edema of both lower extremities    GERD (gastroesophageal reflux disease)    Hypertension    IBS (irritable bowel syndrome)    Lactose intolerance    Morbid obesity with BMI of 45.0-49.9, adult (HCC)    Obesity    Osteoarthritis    PAF (paroxysmal atrial fibrillation) (HCC)    Pre-diabetes    Sleep apnea    uses CPAP machine sometimes    Type 2 diabetes mellitus without complication (HCC)    Vitamin D deficiency     ASSESSMENT 67 year old female who presents to the HF clinic for a follow up appointment. No complaints during this visit. Patient monitors her weight daily and feels to have some fluid in her legs today. Blood pressure is elevated but might be due to the walk to the clinic from the parking lot. Patient states her blood pressure typically runs in the 130s at  home.   Recent ED Visit (past 6 months): Date - 03/02/2022, [facility] - Palpitations.   PLAN CHF/HTN Continue current medications olmesartan, metoprolol, and lasix. Blood pressure is a little elevated. Recommend starting low-dose spironolactone and monitor labs (Scr, K+) in 3 months. Addition of spironolactone will help with fluid as well. Entresto co-pay is $47, if blood pressure remains elevated would recommend transition to entresto at next visit. Defer SGLT2 due to hx of hematuria with a potentially diangosis of UTI, and or bladder complications. Pt is following urology.   Afib Continue diltiazem, flecainide, metoprolol and apixaban. There was a reduce in EF on echo, may need stop diltiazem if EF continues to worsen. Monitor Qtc while on anti-arrhythmics and venlafaxine.   DM:  Continue Semaglutide.    Time spent:  30 minutes  Oswald Hillock, Pharm.D. Clinical Pharmacist 04/28/2022 9:03 AM    Current Outpatient Medications:    apixaban (ELIQUIS) 5 MG TABS tablet, Take 1 tablet (5 mg total) by mouth 2 (two) times daily., Disp: 180 tablet, Rfl: 3   atorvastatin (LIPITOR) 10 MG tablet, TAKE 1 TABLET BY MOUTH EVERY DAY, Disp: 90 tablet, Rfl: 3   diltiazem (DILACOR XR) 180 MG 24 hr capsule, Take 1 capsule (180 mg total) by mouth daily., Disp: 90 capsule, Rfl: 0   famotidine (PEPCID) 20 MG tablet, Take 1 tablet (20 mg total) by mouth daily as needed for indigestion., Disp: 90 tablet, Rfl: 1   flecainide (TAMBOCOR) 50 MG tablet, Take 1 tablet (50 mg total) by mouth 2 (two) times daily., Disp: 180 tablet, Rfl: 0   furosemide (LASIX) 40 MG tablet, Take 1 tablet (40 mg total) by mouth daily., Disp: 90 tablet, Rfl: 0   gabapentin (NEURONTIN) 100 MG capsule, Take 100 mg by mouth at bedtime., Disp: , Rfl:    Metoprolol Tartrate 75 MG TABS, Take 150 mg by mouth 2 (two) times daily., Disp: 360 tablet, Rfl: 0   Multiple Vitamin (MULTIVITAMIN) tablet, Take 1 tablet by mouth daily. BariatricPal Multivitamin, Disp: , Rfl:    olmesartan (BENICAR) 20 MG tablet, Take 1 tablet (20 mg total) by mouth daily., Disp: 90 tablet, Rfl: 0   potassium chloride (KLOR-CON M) 10 MEQ tablet, Take 2 tablets (20 mEq total) by mouth daily., Disp: 180 tablet, Rfl: 0   Semaglutide, 2 MG/DOSE, (OZEMPIC, 2 MG/DOSE,) 8 MG/3ML SOPN, Inject 2 mg into the skin once a week. (Patient taking differently: Inject 2 mg into the skin once a week. Take once weekly on Mondays), Disp: 3 mL, Rfl: 0   triamcinolone (NASACORT) 55 MCG/ACT AERO nasal inhaler, Place 2 sprays into the nose daily., Disp: , Rfl:    venlafaxine XR (EFFEXOR-XR) 75 MG 24 hr capsule, Take 1 capsule (75 mg total) by mouth daily with breakfast., Disp: 90 capsule, Rfl: 1   Vitamin D, Ergocalciferol, (DRISDOL) 1.25 MG (50000 UNIT) CAPS capsule, Take 1 capsule (50,000 Units total) by mouth every  7 (seven) days. (Patient taking differently: Take 50,000 Units by mouth every 7 (seven) days. Take weekly on Monday.), Disp: 12 capsule, Rfl: 1    DRUGS TO CAUTION IN HEART FAILURE  Drug or Class Mechanism  Analgesics NSAIDs COX-2 inhibitors Glucocorticoids  Sodium and water retention, increased systemic vascular resistance, decreased response to diuretics   Diabetes Medications Metformin Thiazolidinediones Rosiglitazone (Avandia) Pioglitazone (Actos) DPP4 Inhibitors Saxagliptin (Onglyza) Sitagliptin (Januvia)   Lactic acidosis Possible calcium channel blockade   Unknown  Antiarrhythmics Class I  Flecainide Disopyramide Class III Sotalol Other Dronedarone  Negative inotrope, proarrhythmic   Proarrhythmic, beta blockade  Negative inotrope  Antihypertensives Alpha Blockers Doxazosin Calcium Channel Blockers Diltiazem Verapamil Nifedipine Central Alpha Adrenergics Moxonidine Peripheral Vasodilators Minoxidil  Increases renin and aldosterone  Negative inotrope    Possible sympathetic withdrawal  Unknown  Anti-infective Itraconazole Amphotericin B  Negative inotrope Unknown  Hematologic Anagrelide Cilostazol   Possible inhibition of PD IV Inhibition of PD III causing arrhythmias  Neurologic/Psychiatric Stimulants Anti-Seizure Drugs Carbamazepine Pregabalin Antidepressants Tricyclics Citalopram Parkinsons Bromocriptine Pergolide Pramipexole Antipsychotics Clozapine Antimigraine Ergotamine Methysergide Appetite suppressants Bipolar Lithium  Peripheral alpha and beta agonist activity  Negative inotrope and chronotrope Calcium channel blockade  Negative inotrope, proarrhythmic Dose-dependent QT prolongation  Excessive serotonin activity/valvular damage Excessive serotonin activity/valvular damage Unknown  IgE mediated hypersensitivy, calcium channel blockade  Excessive serotonin activity/valvular damage Excessive  serotonin activity/valvular damage Valvular damage  Direct myofibrillar degeneration, adrenergic stimulation  Antimalarials Chloroquine Hydroxychloroquine Intracellular inhibition of lysosomal enzymes  Urologic Agents Alpha Blockers Doxazosin Prazosin Tamsulosin Terazosin  Increased renin and aldosterone  Adapted from Page Williemae Natter, et al. "Drugs That May Cause or Exacerbate Heart Failure: A Scientific Statement from the American Heart  Association." Circulation 2016; 134:e32-e69. DOI: 10.1161/CIR.0000000000000426   MEDICATION ADHERENCES TIPS AND STRATEGIES Taking medication as prescribed improves patient outcomes in heart failure (reduces hospitalizations, improves symptoms, increases survival) Side effects of medications can be managed by decreasing doses, switching agents, stopping drugs, or adding additional therapy. Please let someone in the Heart Failure Clinic know if you have having bothersome side effects so we can modify your regimen. Do not alter your medication regimen without talking to [place].  Medication reminders can help patients remember to take drugs on time. If you are missing or forgetting doses you can try linking behaviors, using pill boxes, or an electronic reminder like an alarm on your phone or an app. Some people can also get automated phone calls as medication reminders.

## 2022-04-28 NOTE — Patient Instructions (Addendum)
Continue weighing daily and call for an overnight weight gain of 3 pounds or more or a weekly weight gain of more than 5 pounds.   If you have voicemail, please make sure your mailbox is cleaned out so that we may leave a message and please make sure to listen to any voicemails.    Begin taking spironolactone as 1/2 tablet every morning.    Once I get your lab work back, we will call you to advise of potassium supplements

## 2022-04-28 NOTE — TOC Benefit Eligibility Note (Signed)
Patient Advocate Encounter  Insurance verification completed.    The patient is currently admitted and upon discharge could be taking Entresto 24-26 mg  The current 30 day co-pay is $47.00.   The patient is insured through [employer] Medicare Part D   Drevin Ortner, CPHT Pharmacy Patient Advocate Specialist [facility] Patient Advocate Team Direct Number: [phone]  Fax: [phone]       

## 2022-04-29 ENCOUNTER — Encounter: Payer: Self-pay | Admitting: Cardiology

## 2022-04-29 ENCOUNTER — Ambulatory Visit: Payer: Medicare HMO | Attending: Cardiology | Admitting: Cardiology

## 2022-04-29 VITALS — BP 142/88 | HR 69 | Ht 68.0 in | Wt 260.6 lb

## 2022-04-29 DIAGNOSIS — Z6839 Body mass index (BMI) 39.0-39.9, adult: Secondary | ICD-10-CM | POA: Diagnosis not present

## 2022-04-29 DIAGNOSIS — I10 Essential (primary) hypertension: Secondary | ICD-10-CM

## 2022-04-29 DIAGNOSIS — I480 Paroxysmal atrial fibrillation: Secondary | ICD-10-CM | POA: Diagnosis not present

## 2022-04-29 NOTE — Progress Notes (Signed)
Cardiology Office Note:    Date:  04/29/2022   ID:  Regina Christensen, DOB Jan 17, 1956, MRN [id number]  PCP:  Regina Sizer, MD  Cardiologist:  Regina Sable, MD  Electrophysiologist:  Regina Epley, MD   Referring MD: Regina Sizer, MD   Chief Complaint  Patient presents with   2 Week Follow-up    Afib follow up, no new cardiac concerns     History of Present Illness:    Regina Christensen is a 67 y.o. female with a hx of hypertension, paroxysmal A. fib on Eliquis, CVA 02/2020 (2/2 not taking eliquis), obesity, sleep apnea who presents for follow-up.  Seen 2 weeks ago with symptoms of heart flutters, shortness of breath.  Flecainide was started to help suppress symptoms of paroxysmal atrial fibrillation.  Ablation is being planned next month with electrophysiology.  She states feeling much better since starting flecainide 50 mg twice daily.  Breathing is also better.  Compliant with Eliquis for stroke prophylaxis, no bleeding issues.  Also on diltiazem and Lopressor.   Prior notes Echocardiogram 02/2020 EF 55 to 60%, mildly dilated left atrium  Past Medical History:  Diagnosis Date   Anxiety    Arthritis    Back pain    BPPV (benign paroxysmal positional vertigo)    CHF (congestive heart failure) (HCC)    Chronic diastolic HF (heart failure) (HCC)    Complication of anesthesia    Post-operative hypoxia requiring supplemental oxygen   Current use of long term anticoagulation    Apixaban   CVA (cerebral vascular accident) ([place]) 02/28/2020   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; imaging from [facility]   Depression    Edema of both lower extremities    GERD (gastroesophageal reflux disease)    Hypertension    IBS (irritable bowel syndrome)    Lactose intolerance    Morbid obesity with BMI of 45.0-49.9, adult ([place])    Obesity    Osteoarthritis    PAF (paroxysmal atrial fibrillation) ([place])    Pre-diabetes    Sleep apnea    uses CPAP machine sometimes    Type  2 diabetes mellitus without complication ([place])    Vitamin D deficiency     Past Surgical History:  Procedure Laterality Date   ABDOMINAL HYSTERECTOMY     BREAST EXCISIONAL BIOPSY Left    CARDIOVERSION N/A 03/05/2022   Procedure: CARDIOVERSION;  Surgeon: Regina Sable, MD;  Location: [facility] ORS;  Service: Cardiovascular;  Laterality: N/A;   ENDOMETRIAL ABLATION     x2   ENDOSCOPIC CONCHA BULLOSA RESECTION Bilateral 08/08/2020   Procedure: ENDOSCOPIC CONCHA BULLOSA RESECTION;  Surgeon: Margaretha Sheffield, MD;  Location: [facility] ORS;  Service: ENT;  Laterality: Bilateral;   ETHMOIDECTOMY Right 08/08/2020   Procedure: ETHMOIDECTOMY, LEFT PARTIAL ETHMOIDECTOMY;  Surgeon: Margaretha Sheffield, MD;  Location: [facility] ORS;  Service: ENT;  Laterality: Right;   IMAGE GUIDED SINUS SURGERY N/A 08/08/2020   Procedure: IMAGE GUIDED SINUS SURGERY, RIGHT FRONTAL SINUSOTOMY;  Surgeon: Margaretha Sheffield, MD;  Location: [facility] ORS;  Service: ENT;  Laterality: N/A;   LAPAROSCOPIC SLEEVE GASTRECTOMY     MAXILLARY ANTROSTOMY Bilateral 08/08/2020   Procedure: MAXILLARY ANTROSTOMY with tissue;  Surgeon: Margaretha Sheffield, MD;  Location: [facility] ORS;  Service: ENT;  Laterality: Bilateral;   NASAL TURBINATE REDUCTION  08/27/2020   Procedure: TURBINATE REDUCTION /SUBMUCOSAL DEBRIDEMENT;  Surgeon: Margaretha Sheffield, MD;  Location: [facility] ORS;  Service: ENT;;   TOTAL KNEE ARTHROPLASTY Right 06/2019   TOTAL KNEE ARTHROPLASTY Left 01/31/2021  [facility]    Current Medications: Current Meds  Medication Sig   apixaban (ELIQUIS) 5 MG TABS tablet Take 1 tablet (5 mg total) by mouth 2 (two) times daily.   atorvastatin (LIPITOR) 10 MG tablet TAKE 1 TABLET BY MOUTH EVERY DAY   diltiazem (DILACOR XR) 180 MG 24 hr capsule Take 1 capsule (180 mg total) by mouth daily.   famotidine (PEPCID) 20 MG tablet Take 1 tablet (20 mg total) by mouth daily as needed for indigestion.   flecainide (TAMBOCOR) 50 MG tablet Take 1 tablet (50 mg  total) by mouth 2 (two) times daily.   furosemide (LASIX) 40 MG tablet Take 1 tablet (40 mg total) by mouth daily.   gabapentin (NEURONTIN) 100 MG capsule Take 100 mg by mouth at bedtime.   Metoprolol Tartrate 75 MG TABS Take 150 mg by mouth 2 (two) times daily.   Multiple Vitamin (MULTIVITAMIN) tablet Take 1 tablet by mouth daily. BariatricPal Multivitamin   olmesartan (BENICAR) 20 MG tablet Take 1 tablet (20 mg total) by mouth daily.   potassium chloride (KLOR-CON M) 10 MEQ tablet Take 2 tablets (20 mEq total) by mouth daily.   Semaglutide, 2 MG/DOSE, (OZEMPIC, 2 MG/DOSE,) 8 MG/3ML SOPN Inject 2 mg into the skin once a week. (Patient taking differently: Inject 2 mg into the skin once a week. Take once weekly on Mondays)   triamcinolone (NASACORT) 55 MCG/ACT AERO nasal inhaler Place 2 sprays into the nose daily.   venlafaxine XR (EFFEXOR-XR) 75 MG 24 hr capsule Take 1 capsule (75 mg total) by mouth daily with breakfast.   Vitamin D, Ergocalciferol, (DRISDOL) 1.25 MG (50000 UNIT) CAPS capsule Take 1 capsule (50,000 Units total) by mouth every 7 (seven) days. (Patient taking differently: Take 50,000 Units by mouth every 7 (seven) days. Take weekly on Monday.)     Allergies:   Hydrocodone-acetaminophen, Ace inhibitors, and Hctz [hydrochlorothiazide]   Social History   Socioeconomic History   Marital status: Divorced    Spouse name: Not on file   Number of children: 2   Years of education: Not on file   Highest education level: Associate degree: occupational, Scientist, product/process development, or vocational program  Occupational History   Occupation: Retired/ work PT    Employer: [employer]  Tobacco Use   Smoking status: Never   Smokeless tobacco: Never  Vaping Use   Vaping Use: Never used  Substance and Sexual Activity   Alcohol use: No    Alcohol/week: 0.0 standard drinks of alcohol    Comment: rare   Drug use: No   Sexual activity: Not on file  Other Topics Concern   Not on file  Social History Narrative    Not on file   Social Determinants of Health   Financial Resource Strain: High Risk (01/22/2022)   Overall Financial Resource Strain (CARDIA)    Difficulty of Paying Living Expenses: Very hard  Food Insecurity: No Food Insecurity (04/08/2022)   Hunger Vital Sign    Worried About Running Out of Food in the Last Year: Never true    Ran Out of Food in the Last Year: Never true  Transportation Needs: No Transportation Needs (04/08/2022)   PRAPARE - Administrator, Civil Service (Medical): No    Lack of Transportation (Non-Medical): No  Physical Activity: Insufficiently Active (10/18/2021)   Exercise Vital Sign    Days of Exercise per Week: 3 days    Minutes of Exercise per Session: 10 min  Stress: No Stress  Concern Present (04/08/2022)   [employer] of Occupational Health - Occupational Stress Questionnaire    Feeling of Stress : Only a little  Social Connections: Moderately Integrated (04/08/2022)   Social Connection and Isolation Panel [NHANES]    Frequency of Communication with Friends and Family: More than three times a week    Frequency of Social Gatherings with Friends and Family: More than three times a week    Attends Religious Services: More than 4 times per year    Active Member of [employer] or Organizations: Yes    Attends Banker Meetings: Never    Marital Status: Divorced     Family History: The patient's family history includes Cancer in her brother; Diabetes in her mother; Heart disease in her father; High blood pressure in her mother. There is no history of Mental illness.  Her sister has atrial fibrillation, multiple nephews have atrial fibrillation.  ROS:   Please see the history of present illness.     All other systems reviewed and are negative.  EKGs/Labs/Other Studies Reviewed:    EKG:  EKG is  ordered today.  The ekg ordered today demonstrates normal sinus rhythm, QTc 404  Recent Labs: 03/02/2022: ALT 75; B Natriuretic Peptide  577.9; Magnesium 2.0; TSH 1.359 03/10/2022: Hemoglobin 14.1; Platelets 260 04/28/2022: BUN 12; Creatinine, Ser 0.72; Potassium 3.1; Sodium 139  Recent Lipid Panel    Component Value Date/Time   CHOL 112 02/10/2022 1209   CHOL 151 12/05/2014 1117   TRIG 79 02/10/2022 1209   HDL 66 02/10/2022 1209   HDL 74 12/05/2014 1117   CHOLHDL 1.7 02/10/2022 1209   VLDL 14 03/01/2020 0306   LDLCALC 30 02/10/2022 1209    Physical Exam:    VS:  BP (!) 142/88 (BP Location: Left Arm)   Pulse 69   Ht 5\' 8"  (1.727 m)   Wt 260 lb 9.6 oz (118.2 kg)   SpO2 97%   BMI 39.62 kg/m     Wt Readings from Last 3 Encounters:  04/29/22 260 lb 9.6 oz (118.2 kg)  04/28/22 262 lb (118.8 kg)  04/10/22 258 lb 4 oz (117.1 kg)     GEN:  Well nourished, well developed in no acute distress HEENT: Normal NECK: No JVD; No carotid bruits CARDIAC: Irregular irregular RESPIRATORY:  Clear to auscultation without rales, wheezing or rhonchi  ABDOMEN: Soft, non-tender, distended MUSCULOSKELETAL:  trace edema; No deformity  SKIN: Warm and dry NEUROLOGIC:  Alert and oriented x 3 PSYCHIATRIC:  Normal affect   ASSESSMENT:    1. Paroxysmal A-fib (HCC)   2. Primary hypertension   3. BMI 39.0-39.9,adult    PLAN:    Paroxysmal atrial fibrillation.  EKG shows sinus rhythm, heart rate 69.  Symptoms adequately controlled on flecainide.  Continue flecainide 50 mg twice daily, Lopressor 150 mg twice daily, diltiazem XR 180 mg daily, Eliquis.  RFA being planned tomorrow.  Keep follow-up with EP in 2 weeks prior to ablation for additional input. Hypertension, BP elevated, usually better controlled.  Continue Lopressor, diltiazem, Benicar. Obesity, low-calorie diet, weight loss advised.  Continue Ozempic.  Follow-up in 4 months.  Medication Adjustments/Labs and Tests Ordered: Current medicines are reviewed at length with the patient today.  Concerns regarding medicines are outlined above.  Orders Placed This Encounter   Procedures   EKG 12-Lead    No orders of the defined types were placed in this encounter.    Patient Instructions  Medication Instructions:   Your physician recommends that  you continue on your current medications as directed. Please refer to the Current Medication list given to you today.  *If you need a refill on your cardiac medications before your next appointment, please call your pharmacy*   Lab Work:  None Ordered  If you have labs (blood work) drawn today and your tests are completely normal, you will receive your results only by: MyChart Message (if you have MyChart) OR A paper copy in the mail If you have any lab test that is abnormal or we need to change your treatment, we will call you to review the results.   Testing/Procedures:  None Ordered   Follow-Up: At [facility], you and your health needs are our priority.  As part of our continuing mission to provide you with exceptional heart care, we have created designated Provider Care Teams.  These Care Teams include your primary Cardiologist (physician) and Advanced Practice Providers (APPs -  Physician Assistants and Nurse Practitioners) who all work together to provide you with the care you need, when you need it.  We recommend signing up for the patient portal called "MyChart".  Sign up information is provided on this After Visit Summary.  MyChart is used to connect with patients for Virtual Visits (Telemedicine).  Patients are able to view lab/test results, encounter notes, upcoming appointments, etc.  Non-urgent messages can be sent to your provider as well.   To learn more about what you can do with MyChart, go to [email or web address].    Your next appointment:   4 month(s)  The format for your next appointment:   In Person  Provider:   You may see Debbe Odea, MD or one of the following Advanced Practice Providers on your designated Care Team:   Nicolasa Ducking, NP Eula Listen,  PA-C Cadence Fransico Michael, PA-C Charlsie Quest, NP      Signed, Debbe Odea, MD  04/29/2022 10:43 AM    [facility]

## 2022-04-29 NOTE — Patient Instructions (Signed)
Medication Instructions:   Your physician recommends that you continue on your current medications as directed. Please refer to the Current Medication list given to you today.  *If you need a refill on your cardiac medications before your next appointment, please call your pharmacy*   Lab Work:  None Ordered  If you have labs (blood work) drawn today and your tests are completely normal, you will receive your results only by: [place] (if you have MyChart) OR A paper copy in the mail If you have any lab test that is abnormal or we need to change your treatment, we will call you to review the results.   Testing/Procedures:  None Ordered   Follow-Up: At [facility], you and your health needs are our priority.  As part of our continuing mission to provide you with exceptional heart care, we have created designated Provider Care Teams.  These Care Teams include your primary Cardiologist (physician) and Advanced Practice Providers (APPs -  Physician Assistants and Nurse Practitioners) who all work together to provide you with the care you need, when you need it.  We recommend signing up for the patient portal called "MyChart".  Sign up information is provided on this After Visit Summary.  MyChart is used to connect with patients for Virtual Visits (Telemedicine).  Patients are able to view lab/test results, encounter notes, upcoming appointments, etc.  Non-urgent messages can be sent to your provider as well.   To learn more about what you can do with MyChart, go to [email or web address].    Your next appointment:   4 month(s)  The format for your next appointment:   In Person  Provider:   You may see Kate Sable, MD or one of the following Advanced Practice Providers on your designated Care Team:   Murray Hodgkins, NP Christell Faith, PA-C Cadence Kathlen Mody, PA-C Gerrie Nordmann, NP

## 2022-04-30 ENCOUNTER — Telehealth: Payer: Medicare HMO

## 2022-05-01 ENCOUNTER — Ambulatory Visit (INDEPENDENT_AMBULATORY_CARE_PROVIDER_SITE_OTHER): Payer: Medicare HMO | Admitting: Adult Health

## 2022-05-01 ENCOUNTER — Encounter (INDEPENDENT_AMBULATORY_CARE_PROVIDER_SITE_OTHER): Payer: Self-pay | Admitting: Adult Health

## 2022-05-01 VITALS — BP 148/74 | HR 72 | Temp 97.9°F | Ht 68.0 in | Wt 258.0 lb

## 2022-05-01 DIAGNOSIS — E1169 Type 2 diabetes mellitus with other specified complication: Secondary | ICD-10-CM | POA: Diagnosis not present

## 2022-05-01 DIAGNOSIS — Z7985 Long-term (current) use of injectable non-insulin antidiabetic drugs: Secondary | ICD-10-CM

## 2022-05-01 DIAGNOSIS — I480 Paroxysmal atrial fibrillation: Secondary | ICD-10-CM

## 2022-05-01 DIAGNOSIS — E669 Obesity, unspecified: Secondary | ICD-10-CM | POA: Diagnosis not present

## 2022-05-01 DIAGNOSIS — Z6839 Body mass index (BMI) 39.0-39.9, adult: Secondary | ICD-10-CM

## 2022-05-05 ENCOUNTER — Ambulatory Visit
Admission: RE | Admit: 2022-05-05 | Discharge: 2022-05-05 | Disposition: A | Discharge status: Home / Self Care | Payer: Medicare HMO | Source: Ambulatory Visit | Attending: Urology | Admitting: Urology

## 2022-05-05 DIAGNOSIS — N200 Calculus of kidney: Secondary | ICD-10-CM | POA: Diagnosis not present

## 2022-05-05 DIAGNOSIS — R319 Hematuria, unspecified: Secondary | ICD-10-CM

## 2022-05-05 MED ORDER — IOHEXOL 300 MG/ML  SOLN
100.0000 mL | Freq: Once | INTRAMUSCULAR | Status: AC | PRN
  Administered 2022-05-05: 100 mL via INTRAVENOUS

## 2022-05-06 ENCOUNTER — Other Ambulatory Visit: Payer: Self-pay

## 2022-05-06 ENCOUNTER — Ambulatory Visit (INDEPENDENT_AMBULATORY_CARE_PROVIDER_SITE_OTHER): Payer: Medicare HMO | Admitting: Surgery

## 2022-05-06 ENCOUNTER — Encounter: Payer: Self-pay | Admitting: Surgery

## 2022-05-06 VITALS — BP 155/81 | HR 74 | Temp 98.0°F | Ht 68.0 in | Wt 255.8 lb

## 2022-05-06 DIAGNOSIS — R935 Abnormal findings on diagnostic imaging of other abdominal regions, including retroperitoneum: Secondary | ICD-10-CM | POA: Diagnosis not present

## 2022-05-06 DIAGNOSIS — I480 Paroxysmal atrial fibrillation: Secondary | ICD-10-CM

## 2022-05-06 MED ORDER — APIXABAN 5 MG PO TABS: 5.0000 mg | ORAL_TABLET | Freq: Two times a day (BID) | ORAL | 3 refills | Status: DC

## 2022-05-06 NOTE — Progress Notes (Signed)
Patient ID: Regina Christensen, female   DOB: Nov 20, 1955, 67 y.o.   MRN: [id number]  Chief Complaint: Abnormal CT scan  History of Present Illness Regina Christensen is a 67 y.o. female with presentation after CT scan identified 9 mm appendix, possible adjacent fatty changes suggestive of early appendicitis.  Patient is currently without complaint.  Has no abdominal pain, nausea, vomiting, fevers or chills.  She has not been inadvertently treated with any antibiotics recently that might delay symptoms.  Past Medical History Past Medical History:  Diagnosis Date   Anxiety    Arthritis    Back pain    BPPV (benign paroxysmal positional vertigo)    CHF (congestive heart failure) (HCC)    Chronic diastolic HF (heart failure) (HCC)    Complication of anesthesia    Post-operative hypoxia requiring supplemental oxygen   Current use of long term anticoagulation    Apixaban   CVA (cerebral vascular accident) (HCC) 02/28/2020   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; imaging from [facility]   Depression    Edema of both lower extremities    GERD (gastroesophageal reflux disease)    Hypertension    IBS (irritable bowel syndrome)    Lactose intolerance    Morbid obesity with BMI of 45.0-49.9, adult (HCC)    Obesity    Osteoarthritis    PAF (paroxysmal atrial fibrillation) (HCC)    Pre-diabetes    Sleep apnea    uses CPAP machine sometimes    Type 2 diabetes mellitus without complication (HCC)    Vitamin D deficiency       Past Surgical History:  Procedure Laterality Date   ABDOMINAL HYSTERECTOMY     BREAST EXCISIONAL BIOPSY Left    CARDIOVERSION N/A 03/05/2022   Procedure: CARDIOVERSION;  Surgeon: Debbe Odea, MD;  Location: [facility];  Service: Cardiovascular;  Laterality: N/A;   ENDOMETRIAL ABLATION     x2   ENDOSCOPIC CONCHA BULLOSA RESECTION Bilateral 08/08/2020   Procedure: ENDOSCOPIC CONCHA BULLOSA RESECTION;  Surgeon: Vernie Murders, MD;  Location: [facility];  Service: ENT;   Laterality: Bilateral;   ETHMOIDECTOMY Right 08/08/2020   Procedure: ETHMOIDECTOMY, LEFT PARTIAL ETHMOIDECTOMY;  Surgeon: Vernie Murders, MD;  Location: [facility];  Service: ENT;  Laterality: Right;   IMAGE GUIDED SINUS SURGERY N/A 08/08/2020   Procedure: IMAGE GUIDED SINUS SURGERY, RIGHT FRONTAL SINUSOTOMY;  Surgeon: Vernie Murders, MD;  Location: [facility];  Service: ENT;  Laterality: N/A;   LAPAROSCOPIC SLEEVE GASTRECTOMY     MAXILLARY ANTROSTOMY Bilateral 08/08/2020   Procedure: MAXILLARY ANTROSTOMY with tissue;  Surgeon: Vernie Murders, MD;  Location: [facility];  Service: ENT;  Laterality: Bilateral;   NASAL TURBINATE REDUCTION  08/27/2020   Procedure: TURBINATE REDUCTION /SUBMUCOSAL DEBRIDEMENT;  Surgeon: Vernie Murders, MD;  Location: [facility];  Service: ENT;;   TOTAL KNEE ARTHROPLASTY Right 06/2019   TOTAL KNEE ARTHROPLASTY Left 01/31/2021   [facility]    Allergies  Allergen Reactions   Hydrocodone-Acetaminophen Other (See Comments)    Extreme headache   Ace Inhibitors Cough   Hctz [Hydrochlorothiazide] Rash    face    Current Outpatient Medications  Medication Sig Dispense Refill   acetaminophen (TYLENOL) 650 MG CR tablet Take 650-1,300 mg by mouth every 8 (eight) hours as needed for pain.     apixaban (ELIQUIS) 5 MG TABS tablet Take 1 tablet (5 mg total) by mouth 2 (two) times daily. 180 tablet 3   atorvastatin (LIPITOR) 10 MG tablet TAKE 1 TABLET  BY MOUTH EVERY DAY 90 tablet 3   diltiazem (DILACOR XR) 180 MG 24 hr capsule Take 1 capsule (180 mg total) by mouth daily. 90 capsule 0   famotidine (PEPCID) 20 MG tablet Take 1 tablet (20 mg total) by mouth daily as needed for indigestion. 90 tablet 1   flecainide (TAMBOCOR) 50 MG tablet Take 1 tablet (50 mg total) by mouth 2 (two) times daily. 180 tablet 0   furosemide (LASIX) 40 MG tablet Take 1 tablet (40 mg total) by mouth daily. 90 tablet 0   gabapentin (NEURONTIN) 100 MG capsule Take 100 mg by mouth at  bedtime.     Metoprolol Tartrate 75 MG TABS Take 150 mg by mouth 2 (two) times daily. 360 tablet 0   Multiple Vitamin (MULTIVITAMIN) tablet Take 1 tablet by mouth daily. BariatricPal Multivitamin     olmesartan (BENICAR) 20 MG tablet Take 1 tablet (20 mg total) by mouth daily. 90 tablet 0   potassium chloride (KLOR-CON M) 10 MEQ tablet Take 2 tablets (20 mEq total) by mouth daily. 180 tablet 0   Semaglutide, 2 MG/DOSE, (OZEMPIC, 2 MG/DOSE,) 8 MG/3ML SOPN Inject 2 mg into the skin once a week. 3 mL 0   spironolactone (ALDACTONE) 25 MG tablet Take 0.5 tablets (12.5 mg total) by mouth daily. 45 tablet 3   triamcinolone (NASACORT) 55 MCG/ACT AERO nasal inhaler Place 2 sprays into the nose daily.     venlafaxine XR (EFFEXOR-XR) 75 MG 24 hr capsule Take 1 capsule (75 mg total) by mouth daily with breakfast. 90 capsule 1   Vitamin D, Ergocalciferol, (DRISDOL) 1.25 MG (50000 UNIT) CAPS capsule Take 1 capsule (50,000 Units total) by mouth every 7 (seven) days. 12 capsule 1   No current facility-administered medications for this visit.    Family History Family History  Problem Relation Age of Onset   Diabetes Mother    High blood pressure Mother    Heart disease Father    Cancer Brother    Mental illness Neg Hx       Social History Social History   Tobacco Use   Smoking status: Never   Smokeless tobacco: Never  Vaping Use   Vaping Use: Never used  Substance Use Topics   Alcohol use: No    Alcohol/week: 0.0 standard drinks of alcohol    Comment: rare   Drug use: No        Review of Systems  Constitutional: Negative.   HENT: Negative.    Eyes: Negative.   Respiratory: Negative.    Cardiovascular: Negative.   Gastrointestinal:  Negative for abdominal pain, blood in stool, constipation, diarrhea, nausea and vomiting.  Genitourinary:  Positive for hematuria.  Musculoskeletal:  Positive for back pain.  Skin: Negative.   Neurological:        Mild lumbar radiculopathy   Psychiatric/Behavioral: Negative.       Physical Exam Blood pressure (!) 155/81, pulse 74, temperature 98 F (36.7 C), temperature source Oral, height 5\' 8"  (1.727 m), weight 255 lb 12.8 oz (116 kg), SpO2 94 %. Last Weight  Most recent update: 05/06/2022 11:26 AM    Weight  116 kg (255 lb 12.8 oz)             CONSTITUTIONAL: Well developed, and nourished, appropriately responsive and aware without distress.   EYES: Sclera non-icteric.   EARS, NOSE, MOUTH AND THROAT: Oral mucosa is pink and moist.  Hearing is intact to voice.  NECK: Trachea is midline, and there is  no jugular venous distension.  LYMPH NODES:  Lymph nodes in the neck are not appreciated. RESPIRATORY:  Normal respiratory effort without pathologic use of accessory muscles. CARDIOVASCULAR: Well perfused.  GI: The abdomen is soft, nontender, and nondistended. There were no palpable masses. I did not appreciate hepatosplenomegaly.  MUSCULOSKELETAL:  Symmetrical muscle tone appreciated in all four extremities.    SKIN: Skin turgor is normal. No pathologic skin lesions appreciated.  NEUROLOGIC:  Motor and sensation appear grossly normal.  Cranial nerves are grossly without defect. PSYCH:  Alert and oriented to person, place and time. Affect is appropriate for situation.  Data Reviewed I have personally reviewed what is currently available of the patient's imaging, recent labs and medical records.   Labs:     Latest Ref Rng & Units 03/10/2022    8:22 AM 03/05/2022    6:08 AM 03/04/2022    6:04 AM  CBC  WBC 3.8 - 10.8 Thousand/uL 7.2  7.5  8.5   Hemoglobin 11.7 - 15.5 g/dL 42.8  76.8  11.5   Hematocrit 35.0 - 45.0 % 41.7  37.8  38.5   Platelets 140 - 400 Thousand/uL 260  248  260       Latest Ref Rng & Units 04/28/2022    9:22 AM 03/10/2022    8:22 AM 03/05/2022    6:08 AM  CMP  Glucose 70 - 99 mg/dL 726  96  203   BUN 8 - 23 mg/dL 12  14  18    Creatinine 0.44 - 1.00 mg/dL  5.59  7.41   Sodium 135 - 145  mmol/L 139  140  142   Potassium 3.5 - 5.1 mmol/L 3.1  4.0  4.0   Chloride 98 - 111 mmol/L 102  104  110   CO2 22 - 32 mmol/L 28  27  25    Calcium 8.9 - 10.3 mg/dL 9.1  9.4  8.9       Imaging: Radiological images reviewed:   Within last 24 hrs: CT HEMATURIA WORKUP  Result Date: 05/05/2022 CLINICAL DATA:  Hematuria. EXAM: CT ABDOMEN AND PELVIS WITHOUT AND WITH CONTRAST TECHNIQUE: Multidetector CT imaging of the abdomen and pelvis was performed following the standard protocol before and following the bolus administration of intravenous contrast. RADIATION DOSE REDUCTION: This exam was performed according to the departmental dose-optimization program which includes automated exposure control, adjustment of the mA and/or kV according to patient size and/or use of iterative reconstruction technique. CONTRAST:  OMNIPAQUE IOHEXOL 300 MG/ML  SOLN COMPARISON:  Abdomen and pelvis CT 07/11/2012 FINDINGS: Lower chest: Unremarkable. Hepatobiliary: No suspicious focal abnormality within the liver parenchyma. There is no evidence for gallstones, gallbladder wall thickening, or pericholecystic fluid. No intrahepatic or extrahepatic biliary dilation. Pancreas: No focal mass lesion. No dilatation of the main duct. No intraparenchymal cyst. No peripancreatic edema. 9 mm hypodensity in the uncinate process (image 40/4) is stable comparing back to the exam from 2014 consistent with benign etiology. This may represent volume averaging. No followup imaging is recommended. Spleen: 2 cm low-density lesion in the dome of the spleen may be a cyst or pseudocyst. No specific imaging follow-up recommended. Adrenals/Urinary Tract: No adrenal nodule or mass. Precontrast imaging reveals a 3 mm nonobstructing stone lower pole right kidney. No stones are seen in the left kidney. No ureteral or bladder stones. Imaging after IV contrast administration shows no suspicious enhancing mass lesion in either kidney. Simple cysts in the  lower pole right kidney measure up to  3.1 in 2.6 cm in diameter respectively. 6 mm exophytic hyperattenuating lesion upper pole left kidney on precontrast imaging (34/2) is too small to characterize but given the high density is most likely a tiny hemorrhagic cyst. No followup imaging is recommended. 10 mm probable hemorrhagic cyst noted in the upper interpolar right kidney on 37/2. Delayed post-contrast imaging shows no wall thickening or soft tissue filling defect in either intrarenal collecting system or renal pelvis. Both ureters are well opacified without evidence for wall thickening, soft tissue lesion or focal dilatation. Delayed imaging of the bladder shows no focal wall thickening or mass lesion. Stomach/Bowel: Surgical changes in the stomach are compatible with prior sleeve gastrectomy. Duodenum is normally positioned as is the ligament of Treitz. No small bowel wall thickening. No small bowel dilatation. The terminal ileum is normal. Appendix is abnormally dilated up to 9 mm. There is some subtle stranding in the fat around the appendix without overt edema or inflammation. This appearance is different than on the previous remote exam. No gross colonic mass. No colonic wall thickening. Vascular/Lymphatic: No abdominal aortic aneurysm. No abdominal aortic atherosclerotic calcification. A There is no gastrohepatic or hepatoduodenal ligament lymphadenopathy. No retroperitoneal or mesenteric lymphadenopathy. No pelvic sidewall lymphadenopathy. Reproductive: The uterus is surgically absent. There is no adnexal mass. Other: No intraperitoneal free fluid. 11 mm fluid density perirectal nodule on image 79/4 is indeterminate. Musculoskeletal: Pelvic floor laxity evident. Small sclerotic lesion left ischial tuberosity was present on the previous exam consistent with benign etiology such as bone island. No worrisome lytic or sclerotic osseous abnormality. IMPRESSION: 1. Abnormal appendiceal diameter at 9 mm, new  since 07/11/2012. There is some subtle stranding in the fat around the appendix without overt edema or inflammation. Acute appendicitis cannot be excluded by imaging. 2. 3 mm nonobstructing stone lower pole right kidney. No other urinary stone disease evident. No secondary changes in either kidney or ureter. 3. Simple cyst noted left kidney with tiny proteinaceous or hemorrhagic cysts in both kidneys. 4. 11 mm fluid density perirectal nodule is indeterminate. Consider follow-up CT pelvis in 3 months to ensure stability. 5. Pelvic floor laxity. These results will be called to the ordering clinician or representative by the Radiologist Assistant, and communication documented in the PACS or [employer]. Electronically Signed   By: Kennith Center M.D.   On: 05/05/2022 17:33    Assessment    Clinical findings inconsistent with early appendicitis, evaluating the patient 18 hours after the CT scan exam. Patient Active Problem List   Diagnosis Date Noted   Primary insomnia 04/25/2022   Chronic diastolic heart failure (HCC) 03/06/2022   Obesity (BMI 30-39.9) 03/04/2022   HFrEF (heart failure with reduced ejection fraction) (HCC) 03/03/2022   Primary hypertension 03/03/2022   Atrial fibrillation with RVR (HCC) 03/02/2022   Transaminitis 03/02/2022   Dilated cardiomyopathy (HCC) 03/02/2022   Nonrheumatic tricuspid valve regurgitation 03/02/2022   Osteopenia of necks of both femurs 01/22/2022   Bilateral lower extremity edema 09/30/2021   Acute on chronic combined systolic and diastolic CHF (congestive heart failure) (HCC) 09/10/2021   History of total bilateral knee replacement 09/10/2021   History of blood transfusion 09/10/2020   Current use of long term anticoagulation    Diabetes mellitus (HCC)    Paroxysmal atrial fibrillation (HCC)    GERD without esophagitis    Depression    Vitamin D deficiency 04/12/2020   History of CVA (cerebrovascular accident) without residual deficits 03/05/2020    History of hysterectomy 07/19/2019  Anxiety 07/19/2019   MDD (major depressive disorder), recurrent, in full remission ([place]) 07/19/2019   Insomnia due to mental condition 07/19/2019   Bilateral carpal tunnel syndrome 07/30/2016   OSA (obstructive sleep apnea) 03/07/2016   BPPV (benign paroxysmal positional vertigo) 08/28/2015   Hypertension associated with type 2 diabetes mellitus ([place]) 11/09/2014   GAD (generalized anxiety disorder) 11/09/2014   Acanthosis nigricans 11/09/2014   Morbid obesity ([place]) 01/06/2012    Plan    We discussed the potential development of symptoms, other unlikely causes of the appendiceal enlargement, and expected follow-up.  There are other reasons to have CT scan follow-up as well. Will look at a 34-month follow-up with repeat imaging.  Or return as needed should symptoms develop in the interim.  Face-to-face time spent with the patient and accompanying care providers(if present) was 30 minutes, with more than 50% of the time spent counseling, educating, and coordinating care of the patient.    These notes generated with voice recognition software. I apologize for typographical errors.  Ronny Bacon M.D., FACS 05/06/2022, 11:56 AM

## 2022-05-06 NOTE — Patient Instructions (Addendum)
If you have any concerns or questions, please feel free to call our office.   Appendicitis, Adult  The appendix is a finger-shaped tube that is attached to the large intestine. Appendicitis is inflammation of the appendix. If appendicitis is not treated, it can cause the appendix to tear (rupture). A ruptured appendix can lead to a life-threatening infection. It can also cause a painful collection of pus (abscess) to form in the appendix. What are the causes? This condition may be caused by a blockage in the appendix that leads to infection. The blockage can be caused by: A ball of stool (fecal impaction). Enlarged lymph glands in the intestine. Injury (trauma) to the abdomen. In some cases, the cause may not be known. What increases the risk? This condition is more likely to develop in people who are 3-22 years old. What are the signs or symptoms? Symptoms of this condition include: Pain or tenderness that starts around the belly button and: Moves toward the lower right abdomen. Gets more severe as time passes. Gets worse with coughing or sudden movements. Nausea, vomiting, or loss of appetite. Fever. Constipation or diarrhea. Feeling general discomfort (malaise). How is this diagnosed? This condition may be diagnosed with: A physical exam. Blood tests. Urine test. To confirm the diagnosis, an ultrasound, MRI, or CT scan may be done. How is this treated? This condition can sometimes be treated with antibiotic medicine alone, but it is usually treated with both antibiotics and surgery to remove the appendix (appendectomy). If only antibiotics are given and the appendix is not removed, there is a chance that appendicitis could come back. There are two methods for doing an appendectomy: Open appendectomy. In this surgery, the appendix is removed through a large incision that is made in the lower right abdomen. This procedure may be recommended if: You have major scarring from a  previous surgery. You have a bleeding disorder. You are pregnant and are about to give birth. You have a condition that makes it hard to do surgery through small incisions (laparoscopic procedure). This includes severe infection or a ruptured appendix. Laparoscopic appendectomy. For this method, the appendix is removed through small incisions. This procedure usually causes less pain and fewer problems than an open appendectomy. It also has a shorter recovery time. If the appendix has ruptured and an abscess has formed, a tube (drain) may be placed into the abscess to remove fluid, and antibiotics may be given through an IV. The appendix may or may not need to be removed. Follow these instructions at home: If you had surgery: Follow instructions from your health care provider on how to: Care for yourself at home. Take care of your incision. Medicines Take over-the-counter and prescription medicines only as told by your health care provider. If you were prescribed an antibiotic medicine, take it as told by your health care provider. Do not stop using the antibiotic even if you start to feel better. Ask your health care provider if the medicine prescribed to you: Requires you to avoid driving or using machinery. Can cause constipation. You may need to take these actions to prevent or treat constipation: Drink enough fluid to keep your urine pale yellow. Take over-the-counter or prescription medicines. Eat foods that are high in fiber, such as beans, whole grains, and fresh fruits and vegetables. Limit foods that are high in fat and processed sugars, such as fried or sweet foods. General instructions Follow instructions from your health care provider about eating or drinking restrictions. Do not  use any products that contain nicotine or tobacco. These products include cigarettes, chewing tobacco, and vaping devices, such as e-cigarettes. These can delay incision healing after surgery. If you need  help quitting, ask your health care provider. Return to your normal activities as told by your health care provider. Ask your health care provider what activities are safe for you. Keep all follow-up visits. This is important. Contact a health care provider if: There is pus, blood, or excessive drainage coming from your incision. You have nausea or vomiting. You have a fever. You are feeling tired (fatigue) or muscle aches. Get help right away if: You have worsening abdominal pain that does not get better with medicine. You have shortness of breath. You cannot stop vomiting. These symptoms may represent a serious problem that is an emergency. Do not wait to see if the symptoms will go away. Get medical help right away. Call your local emergency services (911 in the [place] Do not drive yourself to the hospital. Summary Appendicitis is inflammation of the appendix. It can be caused by a blockage in the appendix that leads to infection. This condition is usually treated with both antibiotic medicines and surgery to remove the appendix (appendectomy). Symptoms include pain or tenderness that starts around your belly button and moves toward the lower right abdomen, nausea, vomiting, loss of appetite, fever, constipation or diarrhea, and malaise. To confirm the diagnosis, an ultrasound, MRI, or CT scan may be done. This information is not intended to replace advice given to you by your health care provider. Make sure you discuss any questions you have with your health care provider. Document Revised: 09/27/2020 Document Reviewed: 09/27/2020 Elsevier Patient Education  [place].

## 2022-05-07 ENCOUNTER — Ambulatory Visit
Admission: RE | Admit: 2022-05-07 | Discharge: 2022-05-07 | Disposition: A | Discharge status: Home / Self Care | Payer: Medicare HMO | Source: Ambulatory Visit | Attending: Family Medicine | Admitting: Family Medicine

## 2022-05-07 DIAGNOSIS — Z1231 Encounter for screening mammogram for malignant neoplasm of breast: Secondary | ICD-10-CM

## 2022-05-08 ENCOUNTER — Encounter
Admission: RE | Admit: 2022-05-08 | Discharge: 2022-05-08 | Disposition: A | Discharge status: Home / Self Care | Payer: Medicare HMO | Source: Ambulatory Visit | Attending: Family | Admitting: Family

## 2022-05-08 ENCOUNTER — Encounter: Payer: Self-pay | Admitting: Family

## 2022-05-08 ENCOUNTER — Other Ambulatory Visit: Payer: Self-pay | Admitting: Family

## 2022-05-08 DIAGNOSIS — I5032 Chronic diastolic (congestive) heart failure: Secondary | ICD-10-CM | POA: Insufficient documentation

## 2022-05-08 DIAGNOSIS — Z01812 Encounter for preprocedural laboratory examination: Secondary | ICD-10-CM | POA: Insufficient documentation

## 2022-05-08 DIAGNOSIS — R600 Localized edema: Secondary | ICD-10-CM

## 2022-05-08 LAB — BASIC METABOLIC PANEL
Anion gap: 10 (ref 5–15)
BUN: 16 mg/dL (ref 8–23)
CO2: 24 mmol/L (ref 22–32)
Calcium: 9.1 mg/dL (ref 8.9–10.3)
Chloride: 105 mmol/L (ref 98–111)
Creatinine, Ser: 0.77 mg/dL (ref 0.44–1.00)
GFR, Estimated: >60 mL/min (ref >60)
Glucose, Bld: 100 mg/dL — ABNORMAL HIGH (ref 70–99)
Potassium: 3.8 mmol/L (ref 3.5–5.1)
Sodium: 139 mmol/L (ref 135–145)

## 2022-05-08 MED ORDER — POTASSIUM CHLORIDE CRYS ER 10 MEQ PO TBCR: 10.0000 meq | EXTENDED_RELEASE_TABLET | Freq: Every day | ORAL | 0 refills | Status: DC

## 2022-05-08 NOTE — Progress Notes (Signed)
Chief Complaint:   OBESITY Regina Christensen is here to discuss her progress with her obesity treatment plan along with follow-up of her obesity related diagnoses. Regina Christensen is on the Category 1 Plan and states she is following her eating plan approximately 70% of the time. [place] states she is not exercising.  Today's visit was #: 20 Starting weight: 255 lbs Starting date: 05/10/2020 Today's weight: 258 lbs Today's date: 05/01/2022 Total lbs lost to date: 0 Total lbs lost since last in-office visit: +6 lbs  Interim History:  Regina Christensen last OV at [facility] was on 02/05/2022. She endorses worsening fatigue, palpitations, and shortness of breath. She was recently seen by her Cardiologist and scheduled for Atrial Ablation on 05/29/2022.  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 Type 2 diabetes mellitus with other specified complication, without long-term current use of insulin (HCC) Fasting blood glucose, 94-133.   Lab Results  Component Value Date   HGBA1C 6.0 (H) 02/10/2022   HGBA1C 5.8 (H) 08/28/2021   HGBA1C 5.6 03/13/2021    Patient assistance program with Novo Nordisk Ozempic 2 mg once weekly injection- denies mass in neck, dysphagia, dyspepsia, persistent hoarseness, abdominal pain, or N/V/Constipation.  2. AF (paroxysmal atrial fibrillation) ([place]) 04/29/2022 Cards Notes: Regina Christensen is a 67 y.o. female with a hx of hypertension, paroxysmal A. fib on Eliquis, CVA 02/2020 (2/2 not taking eliquis), obesity, sleep apnea who presents for follow-up.   Seen 2 weeks ago with symptoms of heart flutters, shortness of breath.  Flecainide was started to help suppress symptoms of paroxysmal atrial fibrillation.  Ablation is being planned next month with electrophysiology.  She states feeling much better since starting flecainide 50 mg twice daily.  Breathing is also better.  Compliant with Eliquis for stroke  prophylaxis, no bleeding issues.  Also on diltiazem and Lopressor.   Paroxysmal atrial fibrillation.  EKG shows sinus rhythm, heart rate 69.  Symptoms adequately controlled on flecainide.  Continue flecainide 50 mg twice daily, Lopressor 150 mg twice daily, diltiazem XR 180 mg daily, Eliquis.  RFA being planned tomorrow. Keep follow-up with EP in 2 weeks prior to ablation for additional input.   Assessment/Plan:   1. Type 2 diabetes mellitus with other specified complication, without long-term current use of insulin (HCC) Continue weekly GLP-1 therapy.  2. AF (paroxysmal atrial fibrillation) ([place]) Follow up with cardiology as directed.   She has a scheduled ablation on 05/29/2022.   3. Obesity, current BMI 39.2 Regina Christensen is currently in the action stage of change. As such, her goal is to continue with weight loss efforts. She has agreed to the Category 1 Plan.   Exercise goals:  increase activity as tolerated.   Behavioral modification strategies: increasing lean protein intake, decreasing simple carbohydrates, meal planning and cooking strategies, keeping healthy foods in the home, and planning for success.  Regina Christensen has agreed to follow-up with our clinic in 6 weeks. She was informed of the importance of frequent follow-up visits to maximize her success with intensive lifestyle modifications for her multiple health conditions.   Objective:   Blood pressure (!) 148/74, pulse 72, temperature 97.9 F (36.6 C), height 5\' 8"  (1.727 m), weight 258 lb (117 kg), SpO2 97 %. Body mass index is 39.23 kg/m.  General: Cooperative, alert, well developed, in no acute distress. HEENT: Conjunctivae and lids unremarkable. Cardiovascular: Regular rhythm.  Lungs: Normal work of breathing. Neurologic: No focal deficits.  Lab Results  Component Value Date   CREATININE 0.77 05/08/2022   BUN 16 05/08/2022   NA 139 05/08/2022   K 3.8 05/08/2022   CL 105 05/08/2022   CO2 24 05/08/2022   Lab  Results  Component Value Date   ALT 75 (H) 03/02/2022   AST 48 (H) 03/02/2022   ALKPHOS 83 03/02/2022   BILITOT 1.1 03/02/2022   Lab Results  Component Value Date   HGBA1C 6.0 (H) 02/10/2022   HGBA1C 5.8 (H) 08/28/2021   HGBA1C 5.6 03/13/2021   HGBA1C 6.0 (H) 08/16/2020   HGBA1C 6.2 (H) 03/01/2020   Lab Results  Component Value Date   INSULIN 8.0 08/28/2021   Lab Results  Component Value Date   TSH 1.359 03/02/2022   Lab Results  Component Value Date   CHOL 112 02/10/2022   HDL 66 02/10/2022   LDLCALC 30 02/10/2022   TRIG 79 02/10/2022   CHOLHDL 1.7 02/10/2022   Lab Results  Component Value Date   VD25OH 60 02/10/2022   VD25OH 38.1 08/28/2021   VD25OH 37.3 11/14/2020   Lab Results  Component Value Date   WBC 7.2 03/10/2022   HGB 14.1 03/10/2022   HCT 41.7 03/10/2022   MCV 87.8 03/10/2022   PLT 260 03/10/2022   Lab Results  Component Value Date   IRON 39 11/14/2020   TIBC 329 11/14/2020   FERRITIN 25 11/14/2020   Attestation Statements:   Reviewed by clinician on day of visit: allergies, medications, problem list, medical history, surgical history, family history, social history, and previous encounter notes.  I, Davy Pique, RMA, am acting as Location manager for Mina Marble, NP.  I have reviewed the above documentation for accuracy and completeness, and I agree with the above. -  Deslyn Cavenaugh d. Detravion Tester, NP-C

## 2022-05-14 ENCOUNTER — Other Ambulatory Visit
Admission: RE | Admit: 2022-05-14 | Discharge: 2022-05-14 | Disposition: A | Discharge status: Home / Self Care | Payer: Medicare HMO | Attending: Cardiology | Admitting: Cardiology

## 2022-05-14 ENCOUNTER — Ambulatory Visit: Payer: Medicare HMO | Attending: Cardiology | Admitting: Cardiology

## 2022-05-14 ENCOUNTER — Encounter: Payer: Self-pay | Admitting: Cardiology

## 2022-05-14 VITALS — BP 124/84 | HR 71 | Ht 68.0 in | Wt 258.0 lb

## 2022-05-14 DIAGNOSIS — I4819 Other persistent atrial fibrillation: Secondary | ICD-10-CM | POA: Diagnosis not present

## 2022-05-14 DIAGNOSIS — I480 Paroxysmal atrial fibrillation: Secondary | ICD-10-CM | POA: Diagnosis not present

## 2022-05-14 DIAGNOSIS — I5020 Unspecified systolic (congestive) heart failure: Secondary | ICD-10-CM | POA: Diagnosis not present

## 2022-05-14 DIAGNOSIS — Z79899 Other long term (current) drug therapy: Secondary | ICD-10-CM

## 2022-05-14 DIAGNOSIS — Z01818 Encounter for other preprocedural examination: Secondary | ICD-10-CM | POA: Diagnosis not present

## 2022-05-14 LAB — BASIC METABOLIC PANEL
Anion gap: 12 (ref 5–15)
BUN: 17 mg/dL (ref 8–23)
CO2: 23 mmol/L (ref 22–32)
Calcium: 9.4 mg/dL (ref 8.9–10.3)
Chloride: 102 mmol/L (ref 98–111)
Creatinine, Ser: 0.72 mg/dL (ref 0.44–1.00)
GFR, Estimated: >60 mL/min (ref >60)
Glucose, Bld: 91 mg/dL (ref 70–99)
Potassium: 3.9 mmol/L (ref 3.5–5.1)
Sodium: 137 mmol/L (ref 135–145)

## 2022-05-14 LAB — CBC WITH DIFFERENTIAL/PLATELET
Abs Immature Granulocytes: 0.02 10*3/uL (ref 0.00–0.07)
Basophils Absolute: 0 10*3/uL (ref 0.0–0.1)
Basophils Relative: 0 %
Eosinophils Absolute: 0.1 10*3/uL (ref 0.0–0.5)
Eosinophils Relative: 1 %
HCT: 44.9 % (ref 36.0–46.0)
Hemoglobin: 14.6 g/dL (ref 12.0–15.0)
Immature Granulocytes: 0 %
Lymphocytes Relative: 15 %
Lymphs Abs: 1.1 10*3/uL (ref 0.7–4.0)
MCH: 29.1 pg (ref 26.0–34.0)
MCHC: 32.5 g/dL (ref 30.0–36.0)
MCV: 89.6 fL (ref 80.0–100.0)
Monocytes Absolute: 0.5 10*3/uL (ref 0.1–1.0)
Monocytes Relative: 7 %
Neutro Abs: 5.4 10*3/uL (ref 1.7–7.7)
Neutrophils Relative %: 77 %
Platelets: 291 10*3/uL (ref 150–400)
RBC: 5.01 MIL/uL (ref 3.87–5.11)
RDW: 15.1 % (ref 11.5–15.5)
WBC: 7 10*3/uL (ref 4.0–10.5)
nRBC: 0 % (ref 0.0–0.2)

## 2022-05-14 NOTE — H&P (View-Only) (Signed)
Electrophysiology Office Follow up Visit Note:    Date:  05/14/2022   ID:  Regina Christensen, DOB 1955-07-27, MRN [id number]  PCP:  Steele Sizer, MD  [facility] Cardiologist:  Kate Sable, MD  [facility] Electrophysiologist:  Vickie Epley, MD    Interval History:    Regina Christensen is a 67 y.o. female who presents for a follow up visit. They were last seen in clinic February 19 2022.  At that appointment the treatment options for her atrial fibrillation were discussed.  She was started on flecainide later by Dr. Garen Lah on April 10, 2022.  She has had good control of her arrhythmia with that regimen.  She is actually scheduled for a catheter ablation May 29, 2022.  Today she tells me she is overall doing okay.  She does feel fatigued sometimes.  Is tolerating the flecainide.     Past Medical History:  Diagnosis Date   Anxiety    Arthritis    Back pain    BPPV (benign paroxysmal positional vertigo)    CHF (congestive heart failure) (HCC)    Chronic diastolic HF (heart failure) (HCC)    Complication of anesthesia    Post-operative hypoxia requiring supplemental oxygen   Current use of long term anticoagulation    Apixaban   CVA (cerebral vascular accident) ([place]) 02/28/2020   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; imaging from [facility]   Depression    Edema of both lower extremities    GERD (gastroesophageal reflux disease)    Hypertension    IBS (irritable bowel syndrome)    Lactose intolerance    Morbid obesity with BMI of 45.0-49.9, adult ([place])    Obesity    Osteoarthritis    PAF (paroxysmal atrial fibrillation) ([place])    Pre-diabetes    Sleep apnea    uses CPAP machine sometimes    Type 2 diabetes mellitus without complication ([place])    Vitamin D deficiency     Past Surgical History:  Procedure Laterality Date   ABDOMINAL HYSTERECTOMY     BREAST EXCISIONAL BIOPSY Left    CARDIOVERSION N/A 03/05/2022   Procedure: CARDIOVERSION;   Surgeon: Kate Sable, MD;  Location: [facility] ORS;  Service: Cardiovascular;  Laterality: N/A;   ENDOMETRIAL ABLATION     x2   ENDOSCOPIC CONCHA BULLOSA RESECTION Bilateral 08/08/2020   Procedure: ENDOSCOPIC CONCHA BULLOSA RESECTION;  Surgeon: Margaretha Sheffield, MD;  Location: [facility] ORS;  Service: ENT;  Laterality: Bilateral;   ETHMOIDECTOMY Right 08/08/2020   Procedure: ETHMOIDECTOMY, LEFT PARTIAL ETHMOIDECTOMY;  Surgeon: Margaretha Sheffield, MD;  Location: [facility] ORS;  Service: ENT;  Laterality: Right;   IMAGE GUIDED SINUS SURGERY N/A 08/08/2020   Procedure: IMAGE GUIDED SINUS SURGERY, RIGHT FRONTAL SINUSOTOMY;  Surgeon: Margaretha Sheffield, MD;  Location: [facility] ORS;  Service: ENT;  Laterality: N/A;   LAPAROSCOPIC SLEEVE GASTRECTOMY     MAXILLARY ANTROSTOMY Bilateral 08/08/2020   Procedure: MAXILLARY ANTROSTOMY with tissue;  Surgeon: Margaretha Sheffield, MD;  Location: [facility] ORS;  Service: ENT;  Laterality: Bilateral;   NASAL TURBINATE REDUCTION  08/27/2020   Procedure: TURBINATE REDUCTION /SUBMUCOSAL DEBRIDEMENT;  Surgeon: Margaretha Sheffield, MD;  Location: [facility] ORS;  Service: ENT;;   TOTAL KNEE ARTHROPLASTY Right 06/2019   TOTAL KNEE ARTHROPLASTY Left 01/31/2021   [facility]    Current Medications: Current Meds  Medication Sig   acetaminophen (TYLENOL) 650 MG CR tablet Take 650-1,300 mg by mouth every 8 (eight) hours as needed for pain.   apixaban (  ELIQUIS) 5 MG TABS tablet Take 1 tablet (5 mg total) by mouth 2 (two) times daily.   atorvastatin (LIPITOR) 10 MG tablet TAKE 1 TABLET BY MOUTH EVERY DAY   diltiazem (DILACOR XR) 180 MG 24 hr capsule Take 1 capsule (180 mg total) by mouth daily.   famotidine (PEPCID) 20 MG tablet Take 1 tablet (20 mg total) by mouth daily as needed for indigestion.   flecainide (TAMBOCOR) 50 MG tablet Take 1 tablet (50 mg total) by mouth 2 (two) times daily.   furosemide (LASIX) 40 MG tablet Take 1 tablet (40 mg total) by mouth daily.   gabapentin (NEURONTIN)  100 MG capsule Take 100 mg by mouth at bedtime.   Multiple Vitamin (MULTIVITAMIN) tablet Take 1 tablet by mouth daily. BariatricPal Multivitamin   olmesartan (BENICAR) 20 MG tablet Take 1 tablet (20 mg total) by mouth daily.   potassium chloride (KLOR-CON M) 10 MEQ tablet Take 1 tablet (10 mEq total) by mouth daily.   Semaglutide, 2 MG/DOSE, (OZEMPIC, 2 MG/DOSE,) 8 MG/3ML SOPN Inject 2 mg into the skin once a week.   spironolactone (ALDACTONE) 25 MG tablet Take 0.5 tablets (12.5 mg total) by mouth daily.   triamcinolone (NASACORT) 55 MCG/ACT AERO nasal inhaler Place 2 sprays into the nose daily.   venlafaxine XR (EFFEXOR-XR) 75 MG 24 hr capsule Take 1 capsule (75 mg total) by mouth daily with breakfast.   Vitamin D, Ergocalciferol, (DRISDOL) 1.25 MG (50000 UNIT) CAPS capsule Take 1 capsule (50,000 Units total) by mouth every 7 (seven) days.     Allergies:   Hydrocodone-acetaminophen, Ace inhibitors, and Hctz [hydrochlorothiazide]   Social History   Socioeconomic History   Marital status: Divorced    Spouse name: Not on file   Number of children: 2   Years of education: Not on file   Highest education level: Associate degree: occupational, Hotel manager, or vocational program  Occupational History   Occupation: Retired/ work PT    Employer: [employer]  Tobacco Use   Smoking status: Never   Smokeless tobacco: Never  Vaping Use   Vaping Use: Never used  Substance and Sexual Activity   Alcohol use: No    Alcohol/week: 0.0 standard drinks of alcohol    Comment: rare   Drug use: No   Sexual activity: Not on file  Other Topics Concern   Not on file  Social History Narrative   Not on file   Social Determinants of Health   Financial Resource Strain: High Risk (01/22/2022)   Overall Financial Resource Strain (CARDIA)    Difficulty of Paying Living Expenses: Very hard  Food Insecurity: No Food Insecurity (04/08/2022)   Hunger Vital Sign    Worried About Running Out of Food in the Last Year:  Never true    Ran Out of Food in the Last Year: Never true  Transportation Needs: No Transportation Needs (04/08/2022)   PRAPARE - Hydrologist (Medical): No    Lack of Transportation (Non-Medical): No  Physical Activity: Insufficiently Active (10/18/2021)   Exercise Vital Sign    Days of Exercise per Week: 3 days    Minutes of Exercise per Session: 10 min  Stress: No Stress Concern Present (04/08/2022)   [place]    Feeling of Stress : Only a little  Social Connections: Moderately Integrated (04/08/2022)   Social Connection and Isolation Panel [NHANES]    Frequency of Communication with Friends and Family: More than  three times a week    Frequency of Social Gatherings with Friends and Family: More than three times a week    Attends Religious Services: More than 4 times per year    Active Member of [employer] or Organizations: Yes    Attends Banker Meetings: Never    Marital Status: Divorced     Family History: The patient's family history includes Cancer in her brother; Diabetes in her mother; Heart disease in her father; High blood pressure in her mother. There is no history of Mental illness.  ROS:   Please see the history of present illness.    All other systems reviewed and are negative.  EKGs/Labs/Other Studies Reviewed:    The following studies were reviewed today:   EKG:  The ekg ordered today demonstrates sinus rhythm.  PR interval 164 ms.  QRS duration 82 ms.  Recent Labs: 03/02/2022: ALT 75; B Natriuretic Peptide 577.9; Magnesium 2.0; TSH 1.359 03/10/2022: Hemoglobin 14.1; Platelets 260 05/08/2022: BUN 16; Creatinine, Ser 0.77; Potassium 3.8; Sodium 139  Recent Lipid Panel    Component Value Date/Time   CHOL 112 02/10/2022 1209   CHOL 151 12/05/2014 1117   TRIG 79 02/10/2022 1209   HDL 66 02/10/2022 1209   HDL 74 12/05/2014 1117   CHOLHDL 1.7 02/10/2022  1209   VLDL 14 03/01/2020 0306   LDLCALC 30 02/10/2022 1209    Physical Exam:    VS:  BP 124/84   Pulse 71   Ht 5\' 8"  (1.727 m)   Wt 258 lb (117 kg)   SpO2 96%   BMI 39.23 kg/m     Wt Readings from Last 3 Encounters:  05/14/22 258 lb (117 kg)  05/06/22 255 lb 12.8 oz (116 kg)  05/01/22 258 lb (117 kg)     GEN:  Well nourished, well developed in no acute distress.  Obese CARDIAC: RRR, no murmurs, rubs, gallops PSYCHIATRIC:  Normal affect        ASSESSMENT:    1. Persistent atrial fibrillation (HCC)   2. Encounter for long-term (current) use of high-risk medication   3. HFrEF (heart failure with reduced ejection fraction) (HCC)    PLAN:    In order of problems listed above:  #Persistent atrial fibrillation Was started on flecainide by Dr. 06/30/22 in December.  Not an ideal long-term choice given the patient's history of chronic systolic heart failure.  She is scheduled for an ablation May 29, 2022.  Hopefully if rhythm control is achieved with ablation, can discontinue flecainide.  Continue oral anticoagulant for now.  Discussed treatment options today for their AF including antiarrhythmic drug therapy and ablation. Discussed risks, recovery and likelihood of success. Discussed potential need for repeat ablation procedures and antiarrhythmic drugs after an initial ablation. They wish to proceed with scheduling.  Risk, benefits, and alternatives to EP study and radiofrequency ablation for afib were also discussed in detail today. These risks include but are not limited to stroke, bleeding, vascular damage, tamponade, perforation, damage to the esophagus, lungs, and other structures, pulmonary vein stenosis, worsening renal function, and death. The patient understands these risk and wishes to proceed.  We will therefore proceed with catheter ablation at the next available time.  Carto, ICE, anesthesia are requested for the procedure.  Will also obtain CT PV protocol  prior to the procedure to exclude LAA thrombus and further evaluate atrial anatomy.  #Chronic systolic heart failure NYHA class II-III.  Warm and dry on exam.  EF 40 to  45%.  Continue GDMT.      Medication Adjustments/Labs and Tests Ordered: Current medicines are reviewed at length with the patient today.  Concerns regarding medicines are outlined above.  Orders Placed This Encounter  Procedures   EKG 12-Lead   No orders of the defined types were placed in this encounter.    Signed, Steffanie Dunn, MD, [facility], [facility] 05/14/2022 12:07 PM    Electrophysiology [facility]

## 2022-05-14 NOTE — Progress Notes (Signed)
Electrophysiology Office Follow up Visit Note:    Date:  05/14/2022   ID:  Regina Christensen, DOB 1955-07-27, MRN [id number]  PCP:  Regina Sizer, MD  [facility] Cardiologist:  Regina Sable, MD  [facility] Electrophysiologist:  Regina Epley, MD    Interval History:    Regina Christensen is a 67 y.o. female who presents for a follow up visit. They were last seen in clinic February 19 2022.  At that appointment the treatment options for her atrial fibrillation were discussed.  She was started on flecainide later by Dr. Garen Lah on April 10, 2022.  She has had good control of her arrhythmia with that regimen.  She is actually scheduled for a catheter ablation May 29, 2022.  Today she tells me she is overall doing okay.  She does feel fatigued sometimes.  Is tolerating the flecainide.     Past Medical History:  Diagnosis Date   Anxiety    Arthritis    Back pain    BPPV (benign paroxysmal positional vertigo)    CHF (congestive heart failure) (HCC)    Chronic diastolic HF (heart failure) (HCC)    Complication of anesthesia    Post-operative hypoxia requiring supplemental oxygen   Current use of long term anticoagulation    Apixaban   CVA (cerebral vascular accident) ([place]) 02/28/2020   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; imaging from [facility]   Depression    Edema of both lower extremities    GERD (gastroesophageal reflux disease)    Hypertension    IBS (irritable bowel syndrome)    Lactose intolerance    Morbid obesity with BMI of 45.0-49.9, adult ([place])    Obesity    Osteoarthritis    PAF (paroxysmal atrial fibrillation) ([place])    Pre-diabetes    Sleep apnea    uses CPAP machine sometimes    Type 2 diabetes mellitus without complication ([place])    Vitamin D deficiency     Past Surgical History:  Procedure Laterality Date   ABDOMINAL HYSTERECTOMY     BREAST EXCISIONAL BIOPSY Left    CARDIOVERSION N/A 03/05/2022   Procedure: CARDIOVERSION;   Surgeon: Regina Sable, MD;  Location: [facility] ORS;  Service: Cardiovascular;  Laterality: N/A;   ENDOMETRIAL ABLATION     x2   ENDOSCOPIC CONCHA BULLOSA RESECTION Bilateral 08/08/2020   Procedure: ENDOSCOPIC CONCHA BULLOSA RESECTION;  Surgeon: Margaretha Sheffield, MD;  Location: [facility] ORS;  Service: ENT;  Laterality: Bilateral;   ETHMOIDECTOMY Right 08/08/2020   Procedure: ETHMOIDECTOMY, LEFT PARTIAL ETHMOIDECTOMY;  Surgeon: Margaretha Sheffield, MD;  Location: [facility] ORS;  Service: ENT;  Laterality: Right;   IMAGE GUIDED SINUS SURGERY N/A 08/08/2020   Procedure: IMAGE GUIDED SINUS SURGERY, RIGHT FRONTAL SINUSOTOMY;  Surgeon: Margaretha Sheffield, MD;  Location: [facility] ORS;  Service: ENT;  Laterality: N/A;   LAPAROSCOPIC SLEEVE GASTRECTOMY     MAXILLARY ANTROSTOMY Bilateral 08/08/2020   Procedure: MAXILLARY ANTROSTOMY with tissue;  Surgeon: Margaretha Sheffield, MD;  Location: [facility] ORS;  Service: ENT;  Laterality: Bilateral;   NASAL TURBINATE REDUCTION  08/27/2020   Procedure: TURBINATE REDUCTION /SUBMUCOSAL DEBRIDEMENT;  Surgeon: Margaretha Sheffield, MD;  Location: [facility] ORS;  Service: ENT;;   TOTAL KNEE ARTHROPLASTY Right 06/2019   TOTAL KNEE ARTHROPLASTY Left 01/31/2021   [facility]    Current Medications: Current Meds  Medication Sig   acetaminophen (TYLENOL) 650 MG CR tablet Take 650-1,300 mg by mouth every 8 (eight) hours as needed for pain.   apixaban (  ELIQUIS) 5 MG TABS tablet Take 1 tablet (5 mg total) by mouth 2 (two) times daily.   atorvastatin (LIPITOR) 10 MG tablet TAKE 1 TABLET BY MOUTH EVERY DAY   diltiazem (DILACOR XR) 180 MG 24 hr capsule Take 1 capsule (180 mg total) by mouth daily.   famotidine (PEPCID) 20 MG tablet Take 1 tablet (20 mg total) by mouth daily as needed for indigestion.   flecainide (TAMBOCOR) 50 MG tablet Take 1 tablet (50 mg total) by mouth 2 (two) times daily.   furosemide (LASIX) 40 MG tablet Take 1 tablet (40 mg total) by mouth daily.   gabapentin (NEURONTIN)  100 MG capsule Take 100 mg by mouth at bedtime.   Multiple Vitamin (MULTIVITAMIN) tablet Take 1 tablet by mouth daily. BariatricPal Multivitamin   olmesartan (BENICAR) 20 MG tablet Take 1 tablet (20 mg total) by mouth daily.   potassium chloride (KLOR-CON M) 10 MEQ tablet Take 1 tablet (10 mEq total) by mouth daily.   Semaglutide, 2 MG/DOSE, (OZEMPIC, 2 MG/DOSE,) 8 MG/3ML SOPN Inject 2 mg into the skin once a week.   spironolactone (ALDACTONE) 25 MG tablet Take 0.5 tablets (12.5 mg total) by mouth daily.   triamcinolone (NASACORT) 55 MCG/ACT AERO nasal inhaler Place 2 sprays into the nose daily.   venlafaxine XR (EFFEXOR-XR) 75 MG 24 hr capsule Take 1 capsule (75 mg total) by mouth daily with breakfast.   Vitamin D, Ergocalciferol, (DRISDOL) 1.25 MG (50000 UNIT) CAPS capsule Take 1 capsule (50,000 Units total) by mouth every 7 (seven) days.     Allergies:   Hydrocodone-acetaminophen, Ace inhibitors, and Hctz [hydrochlorothiazide]   Social History   Socioeconomic History   Marital status: Divorced    Spouse name: Not on file   Number of children: 2   Years of education: Not on file   Highest education level: Associate degree: occupational, Hotel manager, or vocational program  Occupational History   Occupation: Retired/ work PT    Employer: [employer]  Tobacco Use   Smoking status: Never   Smokeless tobacco: Never  Vaping Use   Vaping Use: Never used  Substance and Sexual Activity   Alcohol use: No    Alcohol/week: 0.0 standard drinks of alcohol    Comment: rare   Drug use: No   Sexual activity: Not on file  Other Topics Concern   Not on file  Social History Narrative   Not on file   Social Determinants of Health   Financial Resource Strain: High Risk (01/22/2022)   Overall Financial Resource Strain (CARDIA)    Difficulty of Paying Living Expenses: Very hard  Food Insecurity: No Food Insecurity (04/08/2022)   Hunger Vital Sign    Worried About Running Out of Food in the Last Year:  Never true    Ran Out of Food in the Last Year: Never true  Transportation Needs: No Transportation Needs (04/08/2022)   PRAPARE - Hydrologist (Medical): No    Lack of Transportation (Non-Medical): No  Physical Activity: Insufficiently Active (10/18/2021)   Exercise Vital Sign    Days of Exercise per Week: 3 days    Minutes of Exercise per Session: 10 min  Stress: No Stress Concern Present (04/08/2022)   [place]    Feeling of Stress : Only a little  Social Connections: Moderately Integrated (04/08/2022)   Social Connection and Isolation Panel [NHANES]    Frequency of Communication with Friends and Family: More than  three times a week    Frequency of Social Gatherings with Friends and Family: More than three times a week    Attends Religious Services: More than 4 times per year    Active Member of [employer] or Organizations: Yes    Attends Banker Meetings: Never    Marital Status: Divorced     Family History: The patient's family history includes Cancer in her brother; Diabetes in her mother; Heart disease in her father; High blood pressure in her mother. There is no history of Mental illness.  ROS:   Please see the history of present illness.    All other systems reviewed and are negative.  EKGs/Labs/Other Studies Reviewed:    The following studies were reviewed today:   EKG:  The ekg ordered today demonstrates sinus rhythm.  PR interval 164 ms.  QRS duration 82 ms.  Recent Labs: 03/02/2022: ALT 75; B Natriuretic Peptide 577.9; Magnesium 2.0; TSH 1.359 03/10/2022: Hemoglobin 14.1; Platelets 260 05/08/2022: BUN 16; Creatinine, Ser 0.77; Potassium 3.8; Sodium 139  Recent Lipid Panel    Component Value Date/Time   CHOL 112 02/10/2022 1209   CHOL 151 12/05/2014 1117   TRIG 79 02/10/2022 1209   HDL 66 02/10/2022 1209   HDL 74 12/05/2014 1117   CHOLHDL 1.7 02/10/2022  1209   VLDL 14 03/01/2020 0306   LDLCALC 30 02/10/2022 1209    Physical Exam:    VS:  BP 124/84   Pulse 71   Ht 5\' 8"  (1.727 m)   Wt 258 lb (117 kg)   SpO2 96%   BMI 39.23 kg/m     Wt Readings from Last 3 Encounters:  05/14/22 258 lb (117 kg)  05/06/22 255 lb 12.8 oz (116 kg)  05/01/22 258 lb (117 kg)     GEN:  Well nourished, well developed in no acute distress.  Obese CARDIAC: RRR, no murmurs, rubs, gallops PSYCHIATRIC:  Normal affect        ASSESSMENT:    1. Persistent atrial fibrillation (HCC)   2. Encounter for long-term (current) use of high-risk medication   3. HFrEF (heart failure with reduced ejection fraction) (HCC)    PLAN:    In order of problems listed above:  #Persistent atrial fibrillation Was started on flecainide by Dr. 06/30/22 in December.  Not an ideal long-term choice given the patient's history of chronic systolic heart failure.  She is scheduled for an ablation May 29, 2022.  Hopefully if rhythm control is achieved with ablation, can discontinue flecainide.  Continue oral anticoagulant for now.  Discussed treatment options today for their AF including antiarrhythmic drug therapy and ablation. Discussed risks, recovery and likelihood of success. Discussed potential need for repeat ablation procedures and antiarrhythmic drugs after an initial ablation. They wish to proceed with scheduling.  Risk, benefits, and alternatives to EP study and radiofrequency ablation for afib were also discussed in detail today. These risks include but are not limited to stroke, bleeding, vascular damage, tamponade, perforation, damage to the esophagus, lungs, and other structures, pulmonary vein stenosis, worsening renal function, and death. The patient understands these risk and wishes to proceed.  We will therefore proceed with catheter ablation at the next available time.  Carto, ICE, anesthesia are requested for the procedure.  Will also obtain CT PV protocol  prior to the procedure to exclude LAA thrombus and further evaluate atrial anatomy.  #Chronic systolic heart failure NYHA class II-III.  Warm and dry on exam.  EF 40 to  45%.  Continue GDMT.      Medication Adjustments/Labs and Tests Ordered: Current medicines are reviewed at length with the patient today.  Concerns regarding medicines are outlined above.  Orders Placed This Encounter  Procedures   EKG 12-Lead   No orders of the defined types were placed in this encounter.    Signed, Steffanie Dunn, MD, [facility], [facility] 05/14/2022 12:07 PM    Electrophysiology [facility]

## 2022-05-14 NOTE — Patient Instructions (Signed)
Medication Instructions:  Your physician recommends that you continue on your current medications as directed. Please refer to the Current Medication list given to you today.  *If you need a refill on your cardiac medications before your next appointment, please call your pharmacy*  Lab Work: TODAY: CBC  You will get your lab work at [facility]) hospital.  Your lab work will be done at the [facility] next to [employer].  These are walk in labs- you will not need an appointment and you do not need to be fasting.    Follow-Up: At [facility], you and your health needs are our priority.  As part of our continuing mission to provide you with exceptional heart care, we have created designated Provider Care Teams.  These Care Teams include your primary Cardiologist (physician) and Advanced Practice Providers (APPs -  Physician Assistants and Nurse Practitioners) who all work together to provide you with the care you need, when you need it.  Your next appointment:   As scheduled

## 2022-05-20 ENCOUNTER — Ambulatory Visit: Payer: Medicare HMO | Admitting: Urology

## 2022-05-20 ENCOUNTER — Ambulatory Visit (INDEPENDENT_AMBULATORY_CARE_PROVIDER_SITE_OTHER): Payer: Medicare HMO

## 2022-05-20 VITALS — BP 149/83 | HR 78 | Ht 68.0 in | Wt 255.0 lb

## 2022-05-20 DIAGNOSIS — R319 Hematuria, unspecified: Secondary | ICD-10-CM

## 2022-05-20 DIAGNOSIS — E119 Type 2 diabetes mellitus without complications: Secondary | ICD-10-CM

## 2022-05-20 DIAGNOSIS — I5032 Chronic diastolic (congestive) heart failure: Secondary | ICD-10-CM

## 2022-05-20 LAB — URINALYSIS, COMPLETE
Bilirubin, UA: NEGATIVE
Glucose, UA: NEGATIVE
Ketones, UA: NEGATIVE
Nitrite, UA: NEGATIVE
Specific Gravity, UA: 1.02 (ref 1.005–1.030)
Urobilinogen, Ur: 1 mg/dL (ref 0.2–1.0)
pH, UA: 7 (ref 5.0–7.5)

## 2022-05-20 LAB — MICROSCOPIC EXAMINATION
Epithelial Cells (non renal): >10 /hpf — AB (ref 0–10)
RBC, Urine: >30 /hpf — AB (ref 0–2)

## 2022-05-20 NOTE — Progress Notes (Signed)
   05/20/22  CC:  Chief Complaint  Patient presents with   Cysto    HPI: 67 year old female who presents today for cystoscopy.  She initially presented with microscopic hematuria.  She underwent CT urogram completed on 05/05/2022 which showed abnormal appendiceal dilation.  She was referred and seen by general surgery probably for this.  Incidental finding of 3 mm right lower pole nonobstructing stone identified.  She also has an 11 mm perirectal indeterminate nodule.  She is scheduled for a follow-up CT in 3 months with Dr. Christian Mate for this.  There were no vitals taken for this visit. NED. A&Ox3.   No respiratory distress   Abd soft, NT, ND Normal external genitalia with patent urethral meatus  Cystoscopy Procedure Note  Patient identification was confirmed, informed consent was obtained, and patient was prepped using Betadine solution.  Lidocaine jelly was administered per urethral meatus.    Procedure: - Flexible cystoscope introduced, without any difficulty.   - Thorough search of the bladder revealed:    normal urethral meatus    normal urothelium    no stones    no ulcers     no tumors    no urethral polyps    no trabeculation  - Ureteral orifices were normal in position and appearance.  Post-Procedure: - Patient tolerated the procedure well  Assessment/ Plan:  1. Hematuria, microscopic Cystoscopy today is unremarkable  Urinalysis today continues to show greater than 30 red blood cells along with white blood cells and many epithelial cells, asymptomatic and thus likely representing contamination  Possible intermittent scopic hematuria is representative of this as well  Incidental findings of dilated appendix as well as rectal nodule to be followed up in 3 months with CT scan per Dr. Christian Mate, encouraged patient to do so  We also discussed the possibility of nephrogenic bleeding, current renal function is normal without significant proteinuria.  Will defer  nephrology referral today but will engage PCP as well as consider this for the future - Urinalysis, Complete   Follow-up in 6 months with repeat urinalysis in light of continued microscopic hematuria, consider repeat cystoscopy down the road  Hollice Espy, MD

## 2022-05-20 NOTE — Chronic Care Management (AMB) (Signed)
Chronic Care Management   CCM RN Visit Note  05/20/2022 Name: Regina Christensen MRN: [id number] DOB: [birth year]  Subjective: Regina Christensen is a 67 y.o. year old female who is a primary care patient of Steele Sizer, MD. The patient was referred to the Chronic Care Management team for assistance with care management needs subsequent to provider initiation of CCM services and plan of care.    Today's Visit:  Engaged with patient by telephone for follow up visit.       Goals Addressed             This Visit's Progress    Goal: CCM (Congestive Heart Failure) Expected Outcome: Monitor, Self-Manage and Reduce Symptoms of Congestive Heart Failure       Current Barriers:  Chronic Disease Management support and education needs related to CHF  Planned Interventions: Reviewed plan for CHF management.  Reviewed recommended weight parameters. Encouraged to weigh daily and record readings. Encouraged to notify provider for weight gain greater than 3 lbs overnight or weight gain greater than 5 lbs within a week.  Reviewed s/sx of fluid overload. Denies abdominal or lower extremity edema. Reports fatigue with mild activity. Recalls becoming very tired after walking through across a parking lot. Reports symptoms quickly resolve with rest. Denies concerns r/t completion of ADLs or IADLs.  Encouraged to continue monitoring symptoms daily and call as needed with concerns. Advised to continue monitoring sodium consumption and avoiding highly processed foods when possible. No current fluid restriction. Reviewed pending appointments. Will follow up with the Heart Failure clinic on 05/26/22. Reviewed worsening s/sx related to CHF exacerbation and indications for seeking immediate medical attention.    Wt Readings from Last 3 Encounters:  05/20/22 255 lb (115.7 kg)  05/14/22 258 lb (117 kg)  05/06/22 255 lb 12.8 oz (116 kg)     Symptom Management: Take medications as prescribed   Completed  outreach with the Heart Failure team as scheduled on 05/26/22. Call pharmacy for medication refills 3-7 days in advance of running out of medications Notify provider for weight gain greater than 3 lbs overnight or weight gain greater than 5 lbs within a week.  Call provider office for new concerns or questions   Follow Up Plan:   Will follow up next month       Goal: CCM (Diabetes) Expected Outcome: Monitor, Self-Manage and Reduce Symptoms of Diabetes       Current Barriers:  Chronic Disease Management support and education needs related to Diabetes  Planned Interventions: Reviewed plan for management of Diabetes. Reports excellent compliance with medications. Currently using Ozempic. Denies current concerns regarding medication tolerance or prescription cost. Reviewed blood glucose readings. Reports fasting reading have ranged in the 100s to 120s. Reports not checking today. Fasting reading was 110 mg/dl on yesterday. Denies hypoglycemic or hyperglycemic episodes. Attempting to monitor carbs, avoid added sugars and adhere to recommended diabetic diet. Reviewed importance of increasing intake of vegetables and including lean proteins.  Reviewed recommended DM preventive care. Pending updated eye exam.   Lab Results  Component Value Date   HGBA1C 6.0 (H) 02/10/2022    Symptom Management: Take medications as prescribed   Attend all scheduled provider appointments Call pharmacy for medication refills 3-7 days in advance of running out of medications Check fasting blood sugars and record readings Keep appointment with eye doctor Check feet daily for cuts, sores or redness Read food labels for fat, fiber, carbohydrates and portion size Call provider office for new concerns  or questions    Follow Up Plan:  Will follow up next month            PLAN: Will follow up next month  [place] Manager/Chronic Care Management [phone]

## 2022-05-21 ENCOUNTER — Telehealth (HOSPITAL_COMMUNITY): Payer: Self-pay | Admitting: *Deleted

## 2022-05-21 DIAGNOSIS — E1159 Type 2 diabetes mellitus with other circulatory complications: Secondary | ICD-10-CM | POA: Diagnosis not present

## 2022-05-21 DIAGNOSIS — I5030 Unspecified diastolic (congestive) heart failure: Secondary | ICD-10-CM | POA: Diagnosis not present

## 2022-05-21 NOTE — Telephone Encounter (Signed)
Reaching out to patient to offer assistance regarding upcoming cardiac imaging study; pt verbalizes understanding of appt date/time, parking situation and where to check in, pre-test NPO status and medications ordered, and verified current allergies; name and call back number provided for further questions should they arise  Gordy Clement RN Navigator Cardiac Imaging [facility] [phone] office [phone] cell  Patient to take her daily medications two hours prior to her cardiac CT scan.

## 2022-05-22 ENCOUNTER — Ambulatory Visit
Admission: RE | Admit: 2022-05-22 | Discharge: 2022-05-22 | Disposition: A | Discharge status: Home / Self Care | Payer: Medicare HMO | Source: Ambulatory Visit | Attending: Cardiology | Admitting: Cardiology

## 2022-05-22 DIAGNOSIS — Z01818 Encounter for other preprocedural examination: Secondary | ICD-10-CM | POA: Diagnosis not present

## 2022-05-22 DIAGNOSIS — I480 Paroxysmal atrial fibrillation: Secondary | ICD-10-CM

## 2022-05-22 MED ORDER — IOHEXOL 350 MG/ML SOLN
100.0000 mL | Freq: Once | INTRAVENOUS | Status: AC | PRN
  Administered 2022-05-22: 100 mL via INTRAVENOUS

## 2022-05-24 NOTE — Progress Notes (Unsigned)
Patient ID: Regina Christensen, female    DOB: [birth year], 67 y.o.   MRN: [id number]  HPI  Regina Christensen is a 67 y/o female with a history of DM, HTN, stroke, back pain, anxiety, depression, GERD, IBS, PAF, sleep apnea and chronic heart failure.   Echo report from 03/02/22 reviewed and showed an EF of 40-45% along with mild LVH, moderate LAE, mildy elevated PA pressure of 41.6 mmHg, mild/moderate MR and moderate/ severe TR.   Cardiac CT 05/22/22: IMPRESSION: 1. There is normal pulmonary vein drainage into the left atrium. (3 on the right and 2 on the left).  2. The left atrial appendage is a chicken wing type with two lobes and ostial size 27 x 20 mm and length 28 mm. There is no thrombus in the left atrial appendage.  3. The esophagus runs in the left atrial midline and is not in the proximity to any of the pulmonary veins  Admitted 03/02/22 due to palpitations and SOB and found to be in AF RVR.Cardiology consult obtained. Cardizem gtt started. Cardioversion completed. Hypokalemia corrected. Discharged after 4 days.   She presents today for a follow-up visit with a chief complaint of moderate fatigue with exertion. Describes this as chronic in nature. Has associated SOB, pedal edema, dizziness and weakness along with this. Denies any difficulty sleeping, abdominal distention, palpitations, chest pain, wheezing, cough or weight gain.   Scheduled for ablation later this week.   Has not taken her medications yet today. Does note that her home SBP has been running 130-150's.     Past Medical History:  Diagnosis Date   Anxiety    Arthritis    Back pain    BPPV (benign paroxysmal positional vertigo)    CHF (congestive heart failure) (HCC)    Chronic diastolic HF (heart failure) (HCC)    Complication of anesthesia    Post-operative hypoxia requiring supplemental oxygen   Current use of long term anticoagulation    Apixaban   CVA (cerebral vascular accident) ([place]) 02/28/2020   7 x 3 mm  posterior frontal lobe infarct; imaging from [facility]   Depression    Edema of both lower extremities    GERD (gastroesophageal reflux disease)    Hypertension    IBS (irritable bowel syndrome)    Lactose intolerance    Morbid obesity with BMI of 45.0-49.9, adult ([place])    Obesity    Osteoarthritis    PAF (paroxysmal atrial fibrillation) ([place])    Pre-diabetes    Sleep apnea    uses CPAP machine sometimes    Type 2 diabetes mellitus without complication ([place])    Vitamin D deficiency    Past Surgical History:  Procedure Laterality Date   ABDOMINAL HYSTERECTOMY     BREAST EXCISIONAL BIOPSY Left    CARDIOVERSION N/A 03/05/2022   Procedure: CARDIOVERSION;  Surgeon: Kate Sable, MD;  Location: [facility] ORS;  Service: Cardiovascular;  Laterality: N/A;   ENDOMETRIAL ABLATION     x2   ENDOSCOPIC CONCHA BULLOSA RESECTION Bilateral 08/08/2020   Procedure: ENDOSCOPIC CONCHA BULLOSA RESECTION;  Surgeon: Margaretha Sheffield, MD;  Location: [facility] ORS;  Service: ENT;  Laterality: Bilateral;   ETHMOIDECTOMY Right 08/08/2020   Procedure: ETHMOIDECTOMY, LEFT PARTIAL ETHMOIDECTOMY;  Surgeon: Margaretha Sheffield, MD;  Location: [facility] OR[facility];  Service: ENT;  Laterality: Right;   IMAGE GUIDED SINUS SURGERY N/A 08/08/2020   Procedure: IMAGE GUIDED SINUS SURGERY, RIGHT FRONTAL SINUSOTOMY;  Surgeon: Margaretha Sheffield, MD;  Location: [facility] OR[facility];  Service: ENT;  Laterality: N/A;  LAPAROSCOPIC SLEEVE GASTRECTOMY     MAXILLARY ANTROSTOMY Bilateral 08/08/2020   Procedure: MAXILLARY ANTROSTOMY with tissue;  Surgeon: Vernie Murders, MD;  Location: [facility] OR[facility];  Service: ENT;  Laterality: Bilateral;   NASAL TURBINATE REDUCTION  08/27/2020   Procedure: TURBINATE REDUCTION /SUBMUCOSAL DEBRIDEMENT;  Surgeon: Vernie Murders, MD;  Location: [facility] OR[facility];  Service: ENT;;   TOTAL KNEE ARTHROPLASTY Right 06/2019   TOTAL KNEE ARTHROPLASTY Left 01/31/2021   [facility]   Family History  Problem Relation Age of Onset   Diabetes  Mother    High blood pressure Mother    Heart disease Father    Cancer Brother    Mental illness Neg Hx    Social History   Tobacco Use   Smoking status: Never   Smokeless tobacco: Never  Substance Use Topics   Alcohol use: No    Alcohol/week: 0.0 standard drinks of alcohol    Comment: rare   Allergies  Allergen Reactions   Hydrocodone-Acetaminophen Other (See Comments)    Extreme headache   Ace Inhibitors Cough   Hctz [Hydrochlorothiazide] Rash    face   Prior to Admission medications   Medication Sig Start Date End Date Taking? Authorizing Provider  acetaminophen (TYLENOL) 650 MG CR tablet Take 650-1,300 mg by mouth every 8 (eight) hours as needed for pain.   Yes [provider]  apixaban (ELIQUIS) 5 MG TABS tablet Take 1 tablet (5 mg total) by mouth 2 (two) times daily. 05/06/22  Yes Agbor-Etang, Arlys John, MD  atorvastatin (LIPITOR) 10 MG tablet TAKE 1 TABLET BY MOUTH EVERY DAY 03/09/22  Yes Sowles, Danna Hefty, MD  diltiazem (DILACOR XR) 180 MG 24 hr capsule Take 1 capsule (180 mg total) by mouth daily. 04/04/22  Yes Debbe Odea, MD  famotidine (PEPCID) 20 MG tablet Take 1 tablet (20 mg total) by mouth daily as needed for indigestion. 01/22/22  Yes Sowles, Danna Hefty, MD  flecainide (TAMBOCOR) 50 MG tablet Take 1 tablet (50 mg total) by mouth 2 (two) times daily. 04/10/22  Yes Agbor-Etang, Arlys John, MD  furosemide (LASIX) 40 MG tablet Take 1 tablet (40 mg total) by mouth daily. 03/31/22  Yes Sowles, Danna Hefty, MD  gabapentin (NEURONTIN) 100 MG capsule Take 100 mg by mouth at bedtime. 12/20/21  Yes [provider]  Metoprolol Tartrate 75 MG TABS Take 150 mg by mouth 2 (two) times daily. 03/31/22  Yes Sowles, Danna Hefty, MD  Multiple Vitamin (MULTIVITAMIN) tablet Take 1 tablet by mouth daily. BariatricPal Multivitamin   Yes [provider]  olmesartan (BENICAR) 20 MG tablet Take 1 tablet (20 mg total) by mouth daily. 03/31/22  Yes Sowles, Danna Hefty, MD  potassium  chloride (KLOR-CON M) 10 MEQ tablet Take 1 tablet (10 mEq total) by mouth daily. 05/08/22  Yes Ridwan Bondy A, FNP  Semaglutide, 2 MG/DOSE, (OZEMPIC, 2 MG/DOSE,) 8 MG/3ML SOPN Inject 2 mg into the skin once a week. Patient taking differently: Inject 2 mg into the skin once a week. Inject once weekly on Sunday 11/14/21  Yes Danford, Orpha Bur D, NP  spironolactone (ALDACTONE) 25 MG tablet Take 0.5 tablets (12.5 mg total) by mouth daily. 04/28/22  Yes Sinthia Karabin, Inetta Fermo A, FNP  triamcinolone (NASACORT) 55 MCG/ACT AERO nasal inhaler Place 2 sprays into the nose daily.   Yes [provider]  venlafaxine XR (EFFEXOR-XR) 75 MG 24 hr capsule Take 1 capsule (75 mg total) by mouth daily with breakfast. 04/25/22  Yes Eappen, Levin Bacon, MD  Vitamin D, Ergocalciferol, (DRISDOL) 1.25 MG (50000 UNIT) CAPS  capsule Take 1 capsule (50,000 Units total) by mouth every 7 (seven) days. Patient taking differently: Take 50,000 Units by mouth every 7 (seven) days. Take once weekly on Mondays 03/31/22  Yes Sowles, Drue Stager, MD    Review of Systems  Constitutional:  Positive for fatigue (with exertion). Negative for appetite change.  HENT:  Negative for congestion, postnasal drip and sore throat.   Eyes: Negative.   Respiratory:  Positive for shortness of breath (minimal). Negative for cough, chest tightness and wheezing.   Cardiovascular:  Positive for leg swelling. Negative for chest pain and palpitations.  Gastrointestinal:  Negative for abdominal distention and abdominal pain.  Endocrine: Negative.   Genitourinary: Negative.   Musculoskeletal:  Positive for back pain.  Skin: Negative.   Allergic/Immunologic: Negative.   Neurological:  Positive for dizziness (at times) and weakness (legs). Negative for light-headedness.  Hematological:  Negative for adenopathy. Does not bruise/bleed easily.  Psychiatric/Behavioral:  Negative for dysphoric mood and sleep disturbance (sleeping on 1-2 pillows). The patient is not  nervous/anxious.    Vitals:   05/26/22 0824  BP: (!) 169/85  Pulse: 75  Resp: 18  SpO2: 99%  Weight: 256 lb (116.1 kg)   Wt Readings from Last 3 Encounters:  05/26/22 256 lb (116.1 kg)  05/20/22 255 lb (115.7 kg)  05/14/22 258 lb (117 kg)   Lab Results  Component Value Date   CREATININE 0.72 05/14/2022   CREATININE 0.77 05/08/2022   CREATININE 0.72 04/28/2022   Physical Exam Vitals and nursing note reviewed.  Constitutional:      Appearance: Normal appearance.  HENT:     Head: Normocephalic and atraumatic.  Cardiovascular:     Rate and Rhythm: Normal rate. Rhythm irregular.     Heart sounds: Murmur (II/VI) heard.  Pulmonary:     Effort: Pulmonary effort is normal. No respiratory distress.     Breath sounds: No wheezing or rales.  Abdominal:     General: There is no distension.     Palpations: Abdomen is soft.     Tenderness: There is no abdominal tenderness.  Musculoskeletal:        General: No tenderness.     Cervical back: Normal range of motion and neck supple.     Right lower leg: No tenderness. No edema.     Left lower leg: No tenderness. No edema.  Skin:    General: Skin is warm and dry.  Neurological:     General: No focal deficit present.     Mental Status: She is alert and oriented to person, place, and time.  Psychiatric:        Mood and Affect: Mood normal.        Behavior: Behavior normal.        Thought Content: Thought content normal.    Assessment & Plan:  1: Chronic heart failure with reduced ejection fraction- - NYHA class III - euvolemic today - weighing daily; reminded to call for an overnight weight gain of > 2 pounds or a weekly weight gain of > 5 pounds - weight down 6 pounds from last visit here 1 month ago - not adding salt and reading food labels to keep daily sodium intake to < 2000mg   - on GDMT of benicar, spironolactone and metoprolol (although tartrate) - discussed increasing her spiro but prefers to wait until after ablation  later this week - consider changing her benicar to entresto in the future as well - saw cardiology (Agbor-Etang) 04/29/22 - BNP 03/02/22 was  577.9 - has received her flu vaccine for this season - PharmD reconciled meds w/ patient  2: HTN- - BP 169/85 (no meds taken today) - had video visit with PCP Reece Packer) 03/19/22 - BMP 05/14/22 reviewed and showed sodium 137, potassium 3.9, creatinine 0.72 & GFR >60  3: Atrial fibrillation- - saw EP Quentin Ore) 05/14/22 - has had previous cardioversion - has ablation scheduled for 05/29/22 - currently on apixaban, diltiazem, flecainide and metoprolol tartrate  4: DM- - A1c 02/10/22 was 6.0% - currently using ozempic  5: Lymphedema- - wearing compression socks daily with improvement of her swelling   Medication bottles reviewed.   Return in 1 month, sooner if needed.

## 2022-05-26 ENCOUNTER — Ambulatory Visit: Payer: Medicare HMO | Attending: Family | Admitting: Family

## 2022-05-26 ENCOUNTER — Encounter: Payer: Self-pay | Admitting: Family

## 2022-05-26 VITALS — BP 169/85 | HR 75 | Resp 18 | Wt 256.0 lb

## 2022-05-26 DIAGNOSIS — Z79899 Other long term (current) drug therapy: Secondary | ICD-10-CM | POA: Diagnosis not present

## 2022-05-26 DIAGNOSIS — R0602 Shortness of breath: Secondary | ICD-10-CM | POA: Diagnosis not present

## 2022-05-26 DIAGNOSIS — M1990 Unspecified osteoarthritis, unspecified site: Secondary | ICD-10-CM | POA: Diagnosis not present

## 2022-05-26 DIAGNOSIS — R5383 Other fatigue: Secondary | ICD-10-CM | POA: Diagnosis not present

## 2022-05-26 DIAGNOSIS — R42 Dizziness and giddiness: Secondary | ICD-10-CM | POA: Insufficient documentation

## 2022-05-26 DIAGNOSIS — I110 Hypertensive heart disease with heart failure: Secondary | ICD-10-CM | POA: Diagnosis not present

## 2022-05-26 DIAGNOSIS — Z90710 Acquired absence of both cervix and uterus: Secondary | ICD-10-CM | POA: Insufficient documentation

## 2022-05-26 DIAGNOSIS — K589 Irritable bowel syndrome without diarrhea: Secondary | ICD-10-CM | POA: Diagnosis not present

## 2022-05-26 DIAGNOSIS — R531 Weakness: Secondary | ICD-10-CM | POA: Diagnosis not present

## 2022-05-26 DIAGNOSIS — K219 Gastro-esophageal reflux disease without esophagitis: Secondary | ICD-10-CM | POA: Diagnosis not present

## 2022-05-26 DIAGNOSIS — E119 Type 2 diabetes mellitus without complications: Secondary | ICD-10-CM | POA: Diagnosis not present

## 2022-05-26 DIAGNOSIS — G4730 Sleep apnea, unspecified: Secondary | ICD-10-CM | POA: Diagnosis not present

## 2022-05-26 DIAGNOSIS — E876 Hypokalemia: Secondary | ICD-10-CM | POA: Diagnosis not present

## 2022-05-26 DIAGNOSIS — I5022 Chronic systolic (congestive) heart failure: Secondary | ICD-10-CM | POA: Diagnosis not present

## 2022-05-26 DIAGNOSIS — F32A Depression, unspecified: Secondary | ICD-10-CM | POA: Insufficient documentation

## 2022-05-26 DIAGNOSIS — Z8673 Personal history of transient ischemic attack (TIA), and cerebral infarction without residual deficits: Secondary | ICD-10-CM | POA: Diagnosis not present

## 2022-05-26 DIAGNOSIS — I10 Essential (primary) hypertension: Secondary | ICD-10-CM | POA: Diagnosis not present

## 2022-05-26 DIAGNOSIS — I890 Lymphedema, not elsewhere classified: Secondary | ICD-10-CM | POA: Insufficient documentation

## 2022-05-26 DIAGNOSIS — I5042 Chronic combined systolic (congestive) and diastolic (congestive) heart failure: Secondary | ICD-10-CM | POA: Diagnosis not present

## 2022-05-26 DIAGNOSIS — R002 Palpitations: Secondary | ICD-10-CM | POA: Insufficient documentation

## 2022-05-26 DIAGNOSIS — Z6841 Body Mass Index (BMI) 40.0 and over, adult: Secondary | ICD-10-CM | POA: Insufficient documentation

## 2022-05-26 DIAGNOSIS — Z7985 Long-term (current) use of injectable non-insulin antidiabetic drugs: Secondary | ICD-10-CM | POA: Diagnosis not present

## 2022-05-26 DIAGNOSIS — I4819 Other persistent atrial fibrillation: Secondary | ICD-10-CM

## 2022-05-26 DIAGNOSIS — I480 Paroxysmal atrial fibrillation: Secondary | ICD-10-CM | POA: Insufficient documentation

## 2022-05-26 DIAGNOSIS — Z7901 Long term (current) use of anticoagulants: Secondary | ICD-10-CM | POA: Insufficient documentation

## 2022-05-26 NOTE — Progress Notes (Signed)
[facility] - PHARMACIST COUNSELING NOTE   Guideline-Directed Medical Therapy/Evidence Based Medicine   ACE/ARB/ARNI:  Olmesartan 20 mg daily Beta Blocker: Metoprolol tartrate 150 mg twice daily Aldosterone Antagonist:  Spironolactone 12.5 mg daily Diuretic: Furosemide 40 mg daily SGLT2i:  None   Adherence Assessment   Do you ever forget to take your medication? [] Yes [x] No  Do you ever skip doses due to side effects? [] Yes [x] No  Do you have trouble affording your medicines? [] Yes [x] No  Are you ever unable to pick up your medication due to transportation difficulties? [] Yes [x] No  Do you ever stop taking your medications because you don't believe they are helping? [] Yes [x] No  Do you check your weight daily? [x] Yes [] No    Adherence strategy: Patient states she is fairly compliant with taking her medications, but sometimes forgets to take the potassium supplements.    Barriers to obtaining medications: Cost of apixaban when patient is in the donut hole. If starting entresto, cost may become an issue.    Vital signs: HR 70, BP 169/85, weight (pounds) 256 ECHO: Date 02/2022, EF 40-45, notes There is mildly elevated pulmonary artery systolic pressure. There is mild left ventricular hypertrophy. EF decreased from 55-60 on 02/2020.        Latest Ref Rng & Units 03/10/2022    8:22 AM 03/05/2022    6:08 AM 03/04/2022    6:04 AM  BMP  Glucose 65 - 99 mg/dL 96  116  126   BUN 7 - 25 mg/dL 14  18  19    Creatinine 0.50 - 1.05 mg/dL 0.66  0.85  0.74   BUN/Creat Ratio 6 - 22 (calc) SEE NOTE:       Sodium 135 - 146 mmol/L 140  142  138   Potassium 3.5 - 5.3 mmol/L 4.0  4.0  3.6   Chloride 98 - 110 mmol/L 104  110  107   CO2 20 - 32 mmol/L 27  25  22    Calcium 8.6 - 10.4 mg/dL 9.4  8.9  8.8           Past Medical History:  Diagnosis Date   Anxiety     Arthritis     Back pain     BPPV (benign paroxysmal positional vertigo)      CHF (congestive heart failure) (HCC)     Chronic diastolic HF (heart failure) (HCC)     Complication of anesthesia      Post-operative hypoxia requiring supplemental oxygen   Current use of long term anticoagulation      Apixaban   CVA (cerebral vascular accident) ([place]) 02/28/2020    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; imaging from [facility]   Depression     Edema of both lower extremities     GERD (gastroesophageal reflux disease)     Hypertension     IBS (irritable bowel syndrome)     Lactose intolerance     Morbid obesity with BMI of 45.0-49.9, adult (HCC)     Obesity     Osteoarthritis     PAF (paroxysmal atrial fibrillation) (HCC)     Pre-diabetes     Sleep apnea      uses CPAP machine sometimes    Type 2 diabetes mellitus without complication (HCC)     Vitamin D deficiency        ASSESSMENT 67 year old female who presents to the HF clinic for a follow up appointment. Pt  states she had some dizziness last Thursday. Pt has some hx of vertigo in the past and has not been taking meclizine. Patient monitors her weight daily (5 days out of 7) and feels to have some fluid in her legs but overall doing well. Blood pressure is elevated during this visit but pt states she typically runs in the SBP 130-150. BP could be elevated due to the walk to the clinic from the parking lot.    Recent ED Visit (past 6 months): Date - 03/02/2022, [facility] - Palpitations.    PLAN CHF/HTN Continue current medications olmesartan, metoprolol, and lasix. Blood pressure is a elevated. Recommend increasing spironolactone to 25 mg daily and monitor labs (Scr, K+) in 1 month. Entresto co-pay is $47, if blood pressure remains elevated would recommend transition to entresto at next visit. There is room to increase olmesartan if needed. Defer SGLT2 due to hx of hematuria with a potentially diangosis of UTI, and or bladder complications. Pt is following urology. Long term use of flecainide in heart failure is not  recommended, EP is following and will defer to them.    Afib Continue diltiazem, flecainide, metoprolol and apixaban. There was a reduce in EF on echo, may need stop diltiazem if EF continues to worsen and find another option from Flecainide.  Monitor Qtc while on anti-arrhythmics and venlafaxine. Plan for a ablation on 05/29/2022 per patient.    DM:  Continue Semaglutide.     Time spent: 30 minutes   Oswald Hillock, Pharm.D. Clinical Pharmacist 04/28/2022 9:03 AM       Current Outpatient Medications:    apixaban (ELIQUIS) 5 MG TABS tablet, Take 1 tablet (5 mg total) by mouth 2 (two) times daily., Disp: 180 tablet, Rfl: 3   atorvastatin (LIPITOR) 10 MG tablet, TAKE 1 TABLET BY MOUTH EVERY DAY, Disp: 90 tablet, Rfl: 3   diltiazem (DILACOR XR) 180 MG 24 hr capsule, Take 1 capsule (180 mg total) by mouth daily., Disp: 90 capsule, Rfl: 0   famotidine (PEPCID) 20 MG tablet, Take 1 tablet (20 mg total) by mouth daily as needed for indigestion., Disp: 90 tablet, Rfl: 1   flecainide (TAMBOCOR) 50 MG tablet, Take 1 tablet (50 mg total) by mouth 2 (two) times daily., Disp: 180 tablet, Rfl: 0   furosemide (LASIX) 40 MG tablet, Take 1 tablet (40 mg total) by mouth daily., Disp: 90 tablet, Rfl: 0   gabapentin (NEURONTIN) 100 MG capsule, Take 100 mg by mouth at bedtime., Disp: , Rfl:    Metoprolol Tartrate 75 MG TABS, Take 150 mg by mouth 2 (two) times daily., Disp: 360 tablet, Rfl: 0   Multiple Vitamin (MULTIVITAMIN) tablet, Take 1 tablet by mouth daily. BariatricPal Multivitamin, Disp: , Rfl:    olmesartan (BENICAR) 20 MG tablet, Take 1 tablet (20 mg total) by mouth daily., Disp: 90 tablet, Rfl: 0   potassium chloride (KLOR-CON M) 10 MEQ tablet, Take 2 tablets (20 mEq total) by mouth daily., Disp: 180 tablet, Rfl: 0   Semaglutide, 2 MG/DOSE, (OZEMPIC, 2 MG/DOSE,) 8 MG/3ML SOPN, Inject 2 mg into the skin once a week. (Patient taking differently: Inject 2 mg into the skin once a week. Take once weekly on  Mondays), Disp: 3 mL, Rfl: 0   triamcinolone (NASACORT) 55 MCG/ACT AERO nasal inhaler, Place 2 sprays into the nose daily., Disp: , Rfl:    venlafaxine XR (EFFEXOR-XR) 75 MG 24 hr capsule, Take 1 capsule (75 mg total) by mouth daily with breakfast., Disp: 90  capsule, Rfl: 1   Vitamin D, Ergocalciferol, (DRISDOL) 1.25 MG (50000 UNIT) CAPS capsule, Take 1 capsule (50,000 Units total) by mouth every 7 (seven) days. (Patient taking differently: Take 50,000 Units by mouth every 7 (seven) days. Take weekly on Monday.), Disp: 12 capsule, Rfl: 1          DRUGS TO CAUTION IN HEART FAILURE  Drug or Class Mechanism  Analgesics NSAIDs COX-2 inhibitors Glucocorticoids   Sodium and water retention, increased systemic vascular resistance, decreased response to diuretics    Diabetes Medications Metformin Thiazolidinediones Rosiglitazone (Avandia) Pioglitazone (Actos) DPP4 Inhibitors Saxagliptin (Onglyza) Sitagliptin (Januvia)     Lactic acidosis Possible calcium channel blockade     Unknown  Antiarrhythmics Class I  Flecainide Disopyramide Class III Sotalol Other Dronedarone   Negative inotrope, proarrhythmic     Proarrhythmic, beta blockade   Negative inotrope  Antihypertensives Alpha Blockers Doxazosin Calcium Channel Blockers Diltiazem Verapamil Nifedipine Central Alpha Adrenergics Moxonidine Peripheral Vasodilators Minoxidil   Increases renin and aldosterone   Negative inotrope       Possible sympathetic withdrawal   Unknown  Anti-infective Itraconazole Amphotericin B   Negative inotrope Unknown  Hematologic Anagrelide Cilostazol    Possible inhibition of PD IV Inhibition of PD III causing arrhythmias  Neurologic/Psychiatric Stimulants Anti-Seizure Drugs Carbamazepine Pregabalin Antidepressants Tricyclics Citalopram Parkinsons Bromocriptine Pergolide Pramipexole Antipsychotics Clozapine Antimigraine Ergotamine Methysergide Appetite  suppressants Bipolar Lithium   Peripheral alpha and beta agonist activity   Negative inotrope and chronotrope Calcium channel blockade   Negative inotrope, proarrhythmic Dose-dependent QT prolongation   Excessive serotonin activity/valvular damage Excessive serotonin activity/valvular damage Unknown   IgE mediated hypersensitivy, calcium channel blockade   Excessive serotonin activity/valvular damage Excessive serotonin activity/valvular damage Valvular damage   Direct myofibrillar degeneration, adrenergic stimulation  Antimalarials Chloroquine Hydroxychloroquine Intracellular inhibition of lysosomal enzymes  Urologic Agents Alpha Blockers Doxazosin Prazosin Tamsulosin Terazosin   Increased renin and aldosterone  Adapted from Page Carleene Overlie, et al. "Drugs That May Cause or Exacerbate Heart Failure: A Scientific Statement from the American Heart  Association." Circulation 2016; 134:e32-e69. DOI: 10.1161/CIR.0000000000000426     MEDICATION ADHERENCES TIPS AND STRATEGIES Taking medication as prescribed improves patient outcomes in heart failure (reduces hospitalizations, improves symptoms, increases survival) Side effects of medications can be managed by decreasing doses, switching agents, stopping drugs, or adding additional therapy. Please let someone in the [facility] know if you have having bothersome side effects so we can modify your regimen. Do not alter your medication regimen without talking to [place].  Medication reminders can help patients remember to take drugs on time. If you are missing or forgetting doses you can try linking behaviors, using pill boxes, or an electronic reminder like an alarm on your phone or an app. Some people can also get automated phone calls as medication reminders.

## 2022-05-26 NOTE — Patient Instructions (Signed)
Continue weighing daily and call for an overnight weight gain of 3 pounds or more or a weekly weight gain of more than 5 pounds. ? ? ?If you have voicemail, please make sure your mailbox is cleaned out so that we may leave a message and please make sure to listen to any voicemails.  ? ? ?

## 2022-05-28 NOTE — Pre-Procedure Instructions (Signed)
Attempted to contact patient regarding procedure for tomorrow.  Left voice mail on the following items: Arrival time 1000 Nothing to eat or drink after midnight No meds AM of procedure Responsible person to drive you home and stay with you for 24 hrs  Have you missed any doses of anti-coagulant Elqius- if you have missed any doses please let office know right away.  Don't take dose in the morning.

## 2022-05-29 ENCOUNTER — Other Ambulatory Visit: Payer: Self-pay

## 2022-05-29 ENCOUNTER — Ambulatory Visit (HOSPITAL_COMMUNITY)
Admission: RE | Admit: 2022-05-29 | Discharge: 2022-05-29 | Disposition: A | Discharge status: Home / Self Care | Payer: Medicare HMO | Attending: Cardiology | Admitting: Cardiology

## 2022-05-29 ENCOUNTER — Ambulatory Visit (HOSPITAL_COMMUNITY): Payer: Medicare HMO | Admitting: Anesthesiology

## 2022-05-29 ENCOUNTER — Other Ambulatory Visit (HOSPITAL_COMMUNITY): Payer: Self-pay

## 2022-05-29 ENCOUNTER — Encounter (HOSPITAL_COMMUNITY)
Admission: RE | Disposition: A | Discharge status: Home / Self Care | Payer: Medicare HMO | Source: Home / Self Care | Attending: Cardiology

## 2022-05-29 ENCOUNTER — Ambulatory Visit (HOSPITAL_BASED_OUTPATIENT_CLINIC_OR_DEPARTMENT_OTHER): Payer: Medicare HMO | Admitting: Anesthesiology

## 2022-05-29 DIAGNOSIS — Z9989 Dependence on other enabling machines and devices: Secondary | ICD-10-CM | POA: Diagnosis not present

## 2022-05-29 DIAGNOSIS — K219 Gastro-esophageal reflux disease without esophagitis: Secondary | ICD-10-CM | POA: Insufficient documentation

## 2022-05-29 DIAGNOSIS — E119 Type 2 diabetes mellitus without complications: Secondary | ICD-10-CM | POA: Diagnosis not present

## 2022-05-29 DIAGNOSIS — G4730 Sleep apnea, unspecified: Secondary | ICD-10-CM | POA: Insufficient documentation

## 2022-05-29 DIAGNOSIS — Z7985 Long-term (current) use of injectable non-insulin antidiabetic drugs: Secondary | ICD-10-CM | POA: Insufficient documentation

## 2022-05-29 DIAGNOSIS — Z7901 Long term (current) use of anticoagulants: Secondary | ICD-10-CM | POA: Diagnosis not present

## 2022-05-29 DIAGNOSIS — I110 Hypertensive heart disease with heart failure: Secondary | ICD-10-CM | POA: Insufficient documentation

## 2022-05-29 DIAGNOSIS — I509 Heart failure, unspecified: Secondary | ICD-10-CM

## 2022-05-29 DIAGNOSIS — G4733 Obstructive sleep apnea (adult) (pediatric): Secondary | ICD-10-CM

## 2022-05-29 DIAGNOSIS — Z5986 Financial insecurity: Secondary | ICD-10-CM | POA: Diagnosis not present

## 2022-05-29 DIAGNOSIS — Z79899 Other long term (current) drug therapy: Secondary | ICD-10-CM | POA: Diagnosis not present

## 2022-05-29 DIAGNOSIS — I4819 Other persistent atrial fibrillation: Secondary | ICD-10-CM | POA: Diagnosis not present

## 2022-05-29 DIAGNOSIS — Z8249 Family history of ischemic heart disease and other diseases of the circulatory system: Secondary | ICD-10-CM | POA: Diagnosis not present

## 2022-05-29 DIAGNOSIS — I4891 Unspecified atrial fibrillation: Secondary | ICD-10-CM | POA: Diagnosis not present

## 2022-05-29 DIAGNOSIS — I5022 Chronic systolic (congestive) heart failure: Secondary | ICD-10-CM | POA: Diagnosis not present

## 2022-05-29 HISTORY — PX: ATRIAL FIBRILLATION ABLATION: EP1191

## 2022-05-29 LAB — GLUCOSE, CAPILLARY: Glucose-Capillary: 126 mg/dL — ABNORMAL HIGH (ref 70–99)

## 2022-05-29 SURGERY — ATRIAL FIBRILLATION ABLATION
Anesthesia: General

## 2022-05-29 MED ORDER — FENTANYL CITRATE (PF) 250 MCG/5ML IJ SOLN
INTRAMUSCULAR | Status: DC | PRN
  Administered 2022-05-29: 100 ug via INTRAVENOUS

## 2022-05-29 MED ORDER — HEPARIN (PORCINE) IN NACL 1000-0.9 UT/500ML-% IV SOLN
INTRAVENOUS | Status: DC | PRN
  Administered 2022-05-29 (×3): 500 mL

## 2022-05-29 MED ORDER — PANTOPRAZOLE SODIUM 40 MG PO TBEC
40.0000 mg | DELAYED_RELEASE_TABLET | Freq: Every day | ORAL | 0 refills | Status: DC
  Filled 2022-05-29: qty 45, 45d supply, fill #0

## 2022-05-29 MED ORDER — ACETAMINOPHEN 325 MG PO TABS
ORAL_TABLET | ORAL | Status: AC
  Filled 2022-05-29: qty 2

## 2022-05-29 MED ORDER — ONDANSETRON HCL 4 MG/2ML IJ SOLN: 4.0000 mg | Freq: Four times a day (QID) | INTRAMUSCULAR | Status: DC | PRN

## 2022-05-29 MED ORDER — SUGAMMADEX SODIUM 200 MG/2ML IV SOLN
INTRAVENOUS | Status: DC | PRN
  Administered 2022-05-29: 250 mg via INTRAVENOUS

## 2022-05-29 MED ORDER — LABETALOL HCL 5 MG/ML IV SOLN
INTRAVENOUS | Status: DC | PRN
  Administered 2022-05-29: 10 mg via INTRAVENOUS

## 2022-05-29 MED ORDER — PANTOPRAZOLE SODIUM 40 MG PO TBEC
40.0000 mg | DELAYED_RELEASE_TABLET | Freq: Every day | ORAL | Status: DC
  Administered 2022-05-29: 40 mg via ORAL
  Filled 2022-05-29: qty 1

## 2022-05-29 MED ORDER — DEXAMETHASONE SODIUM PHOSPHATE 10 MG/ML IJ SOLN
INTRAMUSCULAR | Status: DC | PRN
  Administered 2022-05-29: 5 mg via INTRAVENOUS

## 2022-05-29 MED ORDER — COLCHICINE 0.6 MG PO TABS
0.6000 mg | ORAL_TABLET | Freq: Two times a day (BID) | ORAL | Status: DC
  Administered 2022-05-29: 0.6 mg via ORAL
  Filled 2022-05-29: qty 1

## 2022-05-29 MED ORDER — ROCURONIUM BROMIDE 10 MG/ML (PF) SYRINGE
PREFILLED_SYRINGE | INTRAVENOUS | Status: DC | PRN
  Administered 2022-05-29: 60 mg via INTRAVENOUS

## 2022-05-29 MED ORDER — ONDANSETRON HCL 4 MG/2ML IJ SOLN
INTRAMUSCULAR | Status: DC | PRN
  Administered 2022-05-29: 4 mg via INTRAVENOUS

## 2022-05-29 MED ORDER — HEPARIN SODIUM (PORCINE) 1000 UNIT/ML IJ SOLN
INTRAMUSCULAR | Status: DC | PRN
  Administered 2022-05-29: 1000 [IU] via INTRAVENOUS

## 2022-05-29 MED ORDER — APIXABAN 5 MG PO TABS
5.0000 mg | ORAL_TABLET | Freq: Two times a day (BID) | ORAL | Status: DC
  Administered 2022-05-29: 5 mg via ORAL
  Filled 2022-05-29: qty 1

## 2022-05-29 MED ORDER — SODIUM CHLORIDE 0.9 % IV SOLN: INTRAVENOUS | Status: DC

## 2022-05-29 MED ORDER — ACETAMINOPHEN 500 MG PO TABS
1000.0000 mg | ORAL_TABLET | Freq: Once | ORAL | Status: AC
  Administered 2022-05-29: 1000 mg via ORAL

## 2022-05-29 MED ORDER — ACETAMINOPHEN 500 MG PO TABS
ORAL_TABLET | ORAL | Status: AC
  Administered 2022-05-29: 650 mg
  Filled 2022-05-29: qty 2

## 2022-05-29 MED ORDER — SODIUM CHLORIDE 0.9 % IV SOLN: 250.0000 mL | INTRAVENOUS | Status: DC | PRN

## 2022-05-29 MED ORDER — SODIUM CHLORIDE 0.9% FLUSH: 3.0000 mL | INTRAVENOUS | Status: DC | PRN

## 2022-05-29 MED ORDER — COLCHICINE 0.6 MG PO TABS
0.6000 mg | ORAL_TABLET | Freq: Two times a day (BID) | ORAL | 0 refills | Status: DC
  Filled 2022-05-29: qty 10, 5d supply, fill #0

## 2022-05-29 MED ORDER — PROPOFOL 10 MG/ML IV BOLUS
INTRAVENOUS | Status: DC | PRN
  Administered 2022-05-29: 130 mg via INTRAVENOUS

## 2022-05-29 MED ORDER — SODIUM CHLORIDE 0.9% FLUSH: 3.0000 mL | Freq: Two times a day (BID) | INTRAVENOUS | Status: DC

## 2022-05-29 MED ORDER — HEPARIN SODIUM (PORCINE) 1000 UNIT/ML IJ SOLN
INTRAMUSCULAR | Status: DC | PRN
  Administered 2022-05-29: 17000 [IU] via INTRAVENOUS

## 2022-05-29 MED ORDER — LIDOCAINE 2% (20 MG/ML) 5 ML SYRINGE
INTRAMUSCULAR | Status: DC | PRN
  Administered 2022-05-29: 80 mg via INTRAVENOUS

## 2022-05-29 MED ORDER — PROTAMINE SULFATE 10 MG/ML IV SOLN
INTRAVENOUS | Status: DC | PRN
  Administered 2022-05-29: 35 mg via INTRAVENOUS

## 2022-05-29 SURGICAL SUPPLY — 18 items
BAG SNAP BAND KOVER 36X36 (MISCELLANEOUS) IMPLANT
CATH ABLAT QDOT MICRO BI TC DF (CATHETERS) IMPLANT
CATH OCTARAY 2.0 F 3-3-3-3-3 (CATHETERS) IMPLANT
CATH S-M CIRCA TEMP PROBE (CATHETERS) IMPLANT
CATH SOUNDSTAR ECO 8FR (CATHETERS) IMPLANT
CATH WEB BI DIR CSDF CRV REPRO (CATHETERS) IMPLANT
CLOSURE PERCLOSE PROSTYLE (VASCULAR PRODUCTS) IMPLANT
COVER SWIFTLINK CONNECTOR (BAG) ×1 IMPLANT
PACK EP LATEX FREE (CUSTOM PROCEDURE TRAY) ×1
PACK EP LF (CUSTOM PROCEDURE TRAY) ×1 IMPLANT
PAD DEFIB RADIO PHYSIO CONN (PAD) ×1 IMPLANT
PATCH CARTO3 (PAD) IMPLANT
SHEATH BAYLIS TRANSSEPTAL 98CM (NEEDLE) IMPLANT
SHEATH CARTO VIZIGO SM CVD (SHEATH) IMPLANT
SHEATH PINNACLE 8F 10CM (SHEATH) IMPLANT
SHEATH PINNACLE 9F 10CM (SHEATH) IMPLANT
SHEATH PROBE COVER 6X72 (BAG) IMPLANT
TUBING SMART ABLATE COOLFLOW (TUBING) IMPLANT

## 2022-05-29 NOTE — Discharge Instructions (Signed)

## 2022-05-29 NOTE — Interval H&P Note (Signed)
History and Physical Interval Note:  05/29/2022 10:30 AM  Regina Christensen  has presented today for surgery, with the diagnosis of afib.  The various methods of treatment have been discussed with the patient and family. After consideration of risks, benefits and other options for treatment, the patient has consented to  Procedure(s): ATRIAL FIBRILLATION ABLATION (N/A) as a surgical intervention.  The patient's history has been reviewed, patient examined, no change in status, stable for surgery.  I have reviewed the patient's chart and labs.  Questions were answered to the patient's satisfaction.     Yamilka Lopiccolo T [name]

## 2022-05-29 NOTE — Transfer of Care (Signed)
Immediate Anesthesia Transfer of Care Note  Patient: Regina Christensen  Procedure(s) Performed: ATRIAL FIBRILLATION ABLATION  Patient Location: Cath Lab  Anesthesia Type:General  Level of Consciousness: awake, alert , and oriented  Airway & Oxygen Therapy: Patient Spontanous Breathing and Patient connected to nasal cannula oxygen  Post-op Assessment: Report given to RN, Post -op Vital signs reviewed and stable, and Patient moving all extremities  Post vital signs: Reviewed and stable  Last Vitals:  Vitals Value Taken Time  BP 164/87 05/29/22 1322  Temp    Pulse 93 05/29/22 1324  Resp 20 05/29/22 1324  SpO2 99 % 05/29/22 1324  Vitals shown include unvalidated device data.  Last Pain:  Vitals:   05/29/22 1302  TempSrc: Temporal  PainSc: 0-No pain    Labetalol given for hypertension per Dr. Gifford Shave  Patients Stated Pain Goal: 3 (67/54/49 2010)  Complications: There were no known notable events for this encounter.

## 2022-05-29 NOTE — Anesthesia Procedure Notes (Addendum)
Procedure Name: Intubation Date/Time: 05/29/2022 11:30 AM  Performed by: Leonor Liv, CRNAPre-anesthesia Checklist: Patient identified, Emergency Drugs available, Suction available and Patient being monitored Patient Re-evaluated:Patient Re-evaluated prior to induction Oxygen Delivery Method: Circle system utilized Preoxygenation: Pre-oxygenation with 100% oxygen Induction Type: IV induction Ventilation: Mask ventilation without difficulty Laryngoscope Size: Mac and 3 Grade View: Grade I Tube type: Oral Tube size: 7.0 mm Number of attempts: 1 Airway Equipment and Method: Stylet and Oral airway Placement Confirmation: ETT inserted through vocal cords under direct vision, positive ETCO2 and breath sounds checked- equal and bilateral Secured at: 21 cm Tube secured with: Tape Dental Injury: Teeth and Oropharynx as per pre-operative assessment

## 2022-05-29 NOTE — Anesthesia Postprocedure Evaluation (Signed)
Anesthesia Post Note  Patient: Regina Christensen  Procedure(s) Performed: ATRIAL FIBRILLATION ABLATION     Patient location during evaluation: PACU Anesthesia Type: General Level of consciousness: awake and alert Pain management: pain level controlled Vital Signs Assessment: post-procedure vital signs reviewed and stable Respiratory status: spontaneous breathing, nonlabored ventilation, respiratory function stable and patient connected to nasal cannula oxygen Cardiovascular status: blood pressure returned to baseline and stable Postop Assessment: no apparent nausea or vomiting Anesthetic complications: no   There were no known notable events for this encounter.  Last Vitals:  Vitals:   05/29/22 1430 05/29/22 1445  BP: 139/83 118/78  Pulse: 92 96  Resp: 16 10  Temp:    SpO2: 97% 99%    Last Pain:  Vitals:   05/29/22 1419  TempSrc:   PainSc: 3                  Santa Lighter

## 2022-05-29 NOTE — Anesthesia Preprocedure Evaluation (Addendum)
Anesthesia Evaluation  Patient identified by MRN, date of birth, ID band Patient awake    Reviewed: Allergy & Precautions, NPO status , Patient's Chart, lab work & pertinent test results, reviewed documented beta blocker date and time   History of Anesthesia Complications (+) history of anesthetic complications  Airway Mallampati: II  TM Distance: >3 FB Neck ROM: Full    Dental  (+) Dental Advisory Given, Missing   Pulmonary sleep apnea and Continuous Positive Airway Pressure Ventilation    Pulmonary exam normal breath sounds clear to auscultation       Cardiovascular hypertension, Pt. on medications and Pt. on home beta blockers +CHF  + dysrhythmias Atrial Fibrillation  Rhythm:Irregular Rate:Abnormal  Echo 03/02/22: 1. Left ventricular ejection fraction, by estimation, is 40 to 45%. The  left ventricle has mildly decreased function. The left ventricle  demonstrates global hypokinesis. There is mild left ventricular  hypertrophy. Left ventricular diastolic parameters  are indeterminate.   2. Right ventricular systolic function is moderately reduced. The right  ventricular size is mildly enlarged. There is mildly elevated pulmonary  artery systolic pressure. The estimated right ventricular systolic  pressure is 67.1 mmHg.   3. Left atrial size was moderately dilated.   4. The mitral valve is normal in structure. Mild to moderate mitral valve  regurgitation. No evidence of mitral stenosis.   5. Tricuspid valve regurgitation is moderate to severe.   6. The aortic valve is normal in structure. Aortic valve regurgitation is  not visualized. Aortic valve sclerosis/calcification is present, without  any evidence of aortic stenosis.   7. The inferior vena cava is dilated in size with <50% respiratory  variability, suggesting right atrial pressure of 15 mmHg.   8. Rhythm is atrial fibrillation.     Neuro/Psych  PSYCHIATRIC  DISORDERS Anxiety Depression    CVA    GI/Hepatic Neg liver ROS,GERD  ,,  Endo/Other  diabetes  Obesity   Renal/GU negative Renal ROS     Musculoskeletal  (+) Arthritis ,    Abdominal   Peds  Hematology  (+) Blood dyscrasia (Eliquis)   Anesthesia Other Findings Day of surgery medications reviewed with the patient.  Reproductive/Obstetrics                             Anesthesia Physical Anesthesia Plan  ASA: 4  Anesthesia Plan: General   Post-op Pain Management: Tylenol PO (pre-op)*   Induction: Intravenous  PONV Risk Score and Plan: 3 and Midazolam, Dexamethasone and Ondansetron  Airway Management Planned: Oral ETT  Additional Equipment:   Intra-op Plan:   Post-operative Plan: Extubation in OR  Informed Consent: I have reviewed the patients History and Physical, chart, labs and discussed the procedure including the risks, benefits and alternatives for the proposed anesthesia with the patient or authorized representative who has indicated his/her understanding and acceptance.     Dental advisory given  Plan Discussed with: CRNA  Anesthesia Plan Comments:        Anesthesia Quick Evaluation

## 2022-05-30 ENCOUNTER — Encounter (HOSPITAL_COMMUNITY): Payer: Self-pay | Admitting: Cardiology

## 2022-05-30 LAB — POCT ACTIVATED CLOTTING TIME: Activated Clotting Time: 428 seconds

## 2022-06-07 DIAGNOSIS — I509 Heart failure, unspecified: Secondary | ICD-10-CM | POA: Diagnosis not present

## 2022-06-07 DIAGNOSIS — H04129 Dry eye syndrome of unspecified lacrimal gland: Secondary | ICD-10-CM | POA: Diagnosis not present

## 2022-06-07 DIAGNOSIS — Z008 Encounter for other general examination: Secondary | ICD-10-CM | POA: Diagnosis not present

## 2022-06-07 DIAGNOSIS — R69 Illness, unspecified: Secondary | ICD-10-CM | POA: Diagnosis not present

## 2022-06-07 DIAGNOSIS — E785 Hyperlipidemia, unspecified: Secondary | ICD-10-CM | POA: Diagnosis not present

## 2022-06-07 DIAGNOSIS — M8580 Other specified disorders of bone density and structure, unspecified site: Secondary | ICD-10-CM | POA: Diagnosis not present

## 2022-06-07 DIAGNOSIS — D6869 Other thrombophilia: Secondary | ICD-10-CM | POA: Diagnosis not present

## 2022-06-07 DIAGNOSIS — M1990 Unspecified osteoarthritis, unspecified site: Secondary | ICD-10-CM | POA: Diagnosis not present

## 2022-06-07 DIAGNOSIS — E119 Type 2 diabetes mellitus without complications: Secondary | ICD-10-CM | POA: Diagnosis not present

## 2022-06-07 DIAGNOSIS — G4733 Obstructive sleep apnea (adult) (pediatric): Secondary | ICD-10-CM | POA: Diagnosis not present

## 2022-06-07 DIAGNOSIS — K219 Gastro-esophageal reflux disease without esophagitis: Secondary | ICD-10-CM | POA: Diagnosis not present

## 2022-06-07 DIAGNOSIS — F324 Major depressive disorder, single episode, in partial remission: Secondary | ICD-10-CM | POA: Diagnosis not present

## 2022-06-11 ENCOUNTER — Encounter: Payer: Self-pay | Admitting: Cardiology

## 2022-06-12 ENCOUNTER — Encounter (INDEPENDENT_AMBULATORY_CARE_PROVIDER_SITE_OTHER): Payer: Self-pay | Admitting: Adult Health

## 2022-06-12 ENCOUNTER — Ambulatory Visit (INDEPENDENT_AMBULATORY_CARE_PROVIDER_SITE_OTHER): Payer: Medicare HMO | Admitting: Adult Health

## 2022-06-12 VITALS — BP 139/86 | HR 83 | Temp 97.7°F | Ht 68.0 in | Wt 259.0 lb

## 2022-06-12 DIAGNOSIS — E669 Obesity, unspecified: Secondary | ICD-10-CM | POA: Diagnosis not present

## 2022-06-12 DIAGNOSIS — E1169 Type 2 diabetes mellitus with other specified complication: Secondary | ICD-10-CM | POA: Diagnosis not present

## 2022-06-12 DIAGNOSIS — Z6839 Body mass index (BMI) 39.0-39.9, adult: Secondary | ICD-10-CM | POA: Diagnosis not present

## 2022-06-12 DIAGNOSIS — Z7985 Long-term (current) use of injectable non-insulin antidiabetic drugs: Secondary | ICD-10-CM

## 2022-06-12 DIAGNOSIS — I480 Paroxysmal atrial fibrillation: Secondary | ICD-10-CM | POA: Diagnosis not present

## 2022-06-12 NOTE — Progress Notes (Signed)
Chief Complaint:   OBESITY Regina Christensen is here to discuss her progress with her obesity treatment plan along with follow-up of her obesity related diagnoses. Regina Christensen is on the Category 1 Plan and states she is following her eating plan approximately 70% of the time. Regina Christensen states she is currently exercising- awaiting to start Cardiac Rehab s/p   Today's visit was #: 21 Starting weight: 277 lbs Starting date: 05/10/20 Today's weight: 259 lbs Today's date: 06/12/2022 Total lbs lost to date: 18 lbs Total lbs lost since last in-office visit: + 1 lbs  Interim History:  05/29/2022- Atrial Fibrillation Ablation Held GLP-1 therapy for 2 weeks, restarted without SE on 06/01/22 She reports intermittent, brief heart palpitations. She reports increase in fatigue, but believes it is waning since procedure.  She has not been as active as she typically is- awaiting to start Cardiac Rehab  Subjective:   1. AF (paroxysmal atrial fibrillation) (Regina Christensen) 05/29/2022- Atrial Fibrillation Ablation  2. Type 2 diabetes mellitus with other specified complication, without long-term current use of insulin (HCC) Lab Results  Component Value Date   HGBA1C 6.0 (H) 02/10/2022   HGBA1C 5.8 (H) 08/28/2021   HGBA1C 5.6 03/13/2021   She is on weekly Ozempic 42m- denies mass in neck, dysphagia, dyspepsia, persistent hoarseness, abdominal pain, or N/V/C She held Ozempic 268mfor two weeks- resumed on 06/01/22 PCP assumed prescription of GLP-1 therapy  Assessment/Plan:   1. AF (paroxysmal atrial fibrillation) (HCFajardoEstablish with HF clinic Start Cardiac Rehab F/u with other specialists as directed.  2. Type 2 diabetes mellitus with other specified complication, without long-term current use of insulin (HCC) Reduce sugar/CHO intake Continue weekly Ozempic- dose not need RF today.  3. Obesity, current BMI 39.5 Regina Christensen currently in the action stage of change. As such, her goal is to continue with weight  loss efforts. She has agreed to the Category 1 Plan.   Exercise goals:  Cardiac Rehab and daily walking  Behavioral modification strategies: increasing lean protein intake, decreasing simple carbohydrates, increasing vegetables, increasing water intake, decreasing sodium intake, and planning for success.  Regina Christensen agreed to follow-up with our clinic in 4 weeks. She was informed of the importance of frequent follow-up visits to maximize her success with intensive lifestyle modifications for her multiple health conditions.    Objective:   Blood pressure 139/86, pulse 83, temperature 97.7 F (36.5 C), height 5' 8"$  (1.727 m), weight 259 lb (117.5 kg), SpO2 99 %. Body mass index is 39.38 kg/m.  General: Cooperative, alert, well developed, in no acute distress. HEENT: Conjunctivae and lids unremarkable. Cardiovascular: Regular rhythm.  Lungs: Normal work of breathing. Neurologic: No focal deficits.   Lab Results  Component Value Date   CREATININE 0.72 05/14/2022   BUN 17 05/14/2022   NA 137 05/14/2022   K 3.9 05/14/2022   CL 102 05/14/2022   CO2 23 05/14/2022   Lab Results  Component Value Date   ALT 75 (H) 03/02/2022   AST 48 (H) 03/02/2022   ALKPHOS 83 03/02/2022   BILITOT 1.1 03/02/2022   Lab Results  Component Value Date   HGBA1C 6.0 (H) 02/10/2022   HGBA1C 5.8 (H) 08/28/2021   HGBA1C 5.6 03/13/2021   HGBA1C 6.0 (H) 08/16/2020   HGBA1C 6.2 (H) 03/01/2020   Lab Results  Component Value Date   INSULIN 8.0 08/28/2021   Lab Results  Component Value Date   TSH 1.359 03/02/2022   Lab Results  Component Value Date   CHOL  112 02/10/2022   HDL 66 02/10/2022   LDLCALC 30 02/10/2022   TRIG 79 02/10/2022   CHOLHDL 1.7 02/10/2022   Lab Results  Component Value Date   VD25OH 60 02/10/2022   VD25OH 38.1 08/28/2021   VD25OH 37.3 11/14/2020   Lab Results  Component Value Date   WBC 7.0 05/14/2022   HGB 14.6 05/14/2022   HCT 44.9 05/14/2022   MCV 89.6  05/14/2022   PLT 291 05/14/2022   Lab Results  Component Value Date   IRON 39 11/14/2020   TIBC 329 11/14/2020   FERRITIN 25 11/14/2020   Attestation Statements:   Reviewed by clinician on day of visit: allergies, medications, problem list, medical history, surgical history, family history, social history, and previous encounter notes.  Time spent on visit including pre-visit chart review and post-visit care and charting was 22 minutes.   I have reviewed the above documentation for accuracy and completeness, and I agree with the above. -  Daisy Lites d. Magdalynn Davilla, NP-C

## 2022-06-18 ENCOUNTER — Telehealth: Payer: Medicare HMO

## 2022-06-23 NOTE — Progress Notes (Unsigned)
Patient ID: Regina Christensen, female    DOB: Jul 13, 1955, 67 y.o.   MRN: [id number]  HPI  Ms Cott is a 67 y/o female with a history of DM, HTN, stroke, back pain, anxiety, depression, GERD, IBS, PAF, sleep apnea and chronic heart failure.   Echo report from 03/02/22 reviewed and showed an EF of 40-45% along with mild LVH, moderate LAE, mildy elevated PA pressure of 41.6 mmHg, mild/moderate MR and moderate/ severe TR.   Cardiac CT 05/22/22: IMPRESSION: 1. There is normal pulmonary vein drainage into the left atrium. (3 on the right and 2 on the left).  2. The left atrial appendage is a chicken wing type with two lobes and ostial size 27 x 20 mm and length 28 mm. There is no thrombus in the left atrial appendage.  3. The esophagus runs in the left atrial midline and is not in the proximity to any of the pulmonary veins  Ablation done 05/29/22. Admitted 03/02/22 due to palpitations and SOB and found to be in AF RVR.Cardiology consult obtained. Cardizem gtt started. Cardioversion completed. Hypokalemia corrected. Discharged after 4 days.   She presents today for a HF follow-up visit with a chief complaint of moderate fatigue with minimal exertion. Describes this as chronic in nature. Has associated SOB, pedal edema and gradual weight gain along with this. Denies any difficulty sleeping, dizziness, abdominal distention, palpitations, chest pain, wheezing or cough.   Had her ablation done. Voices frustration with the level of fatigue that she continues to have and would like to be "back close to how I used to feel".   Past Medical History:  Diagnosis Date   Anxiety    Arthritis    Back pain    BPPV (benign paroxysmal positional vertigo)    CHF (congestive heart failure) (HCC)    Chronic diastolic HF (heart failure) (HCC)    Complication of anesthesia    Post-operative hypoxia requiring supplemental oxygen   Current use of long term anticoagulation    Apixaban   CVA (cerebral vascular  accident) ([place]) 02/28/2020   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; imaging from [facility]   Depression    Edema of both lower extremities    GERD (gastroesophageal reflux disease)    Hypertension    IBS (irritable bowel syndrome)    Lactose intolerance    Morbid obesity with BMI of 45.0-49.9, adult ([place])    Obesity    Osteoarthritis    PAF (paroxysmal atrial fibrillation) ([place])    Pre-diabetes    Sleep apnea    uses CPAP machine sometimes    Type 2 diabetes mellitus without complication ([place])    Vitamin D deficiency    Past Surgical History:  Procedure Laterality Date   ABDOMINAL HYSTERECTOMY     ATRIAL FIBRILLATION ABLATION N/A 05/29/2022   Procedure: ATRIAL FIBRILLATION ABLATION;  Surgeon: Vickie Epley, MD;  Location: [facility];  Service: Cardiovascular;  Laterality: N/A;   BREAST EXCISIONAL BIOPSY Left    CARDIOVERSION N/A 03/05/2022   Procedure: CARDIOVERSION;  Surgeon: Kate Sable, MD;  Location: [facility];  Service: Cardiovascular;  Laterality: N/A;   ENDOMETRIAL ABLATION     x2   ENDOSCOPIC CONCHA BULLOSA RESECTION Bilateral 08/08/2020   Procedure: ENDOSCOPIC CONCHA BULLOSA RESECTION;  Surgeon: Margaretha Sheffield, MD;  Location: [facility];  Service: ENT;  Laterality: Bilateral;   ETHMOIDECTOMY Right 08/08/2020   Procedure: ETHMOIDECTOMY, LEFT PARTIAL ETHMOIDECTOMY;  Surgeon: Margaretha Sheffield, MD;  Location: [facility];  Service: ENT;  Laterality: Right;   IMAGE GUIDED SINUS SURGERY N/A 08/08/2020   Procedure: IMAGE GUIDED SINUS SURGERY, RIGHT FRONTAL SINUSOTOMY;  Surgeon: Margaretha Sheffield, MD;  Location: [facility];  Service: ENT;  Laterality: N/A;   LAPAROSCOPIC SLEEVE GASTRECTOMY     MAXILLARY ANTROSTOMY Bilateral 08/08/2020   Procedure: MAXILLARY ANTROSTOMY with tissue;  Surgeon: Margaretha Sheffield, MD;  Location: [facility];  Service: ENT;  Laterality: Bilateral;   NASAL TURBINATE REDUCTION  08/27/2020   Procedure: TURBINATE REDUCTION /SUBMUCOSAL DEBRIDEMENT;  Surgeon:  Margaretha Sheffield, MD;  Location: [facility];  Service: ENT;;   TOTAL KNEE ARTHROPLASTY Right 06/2019   TOTAL KNEE ARTHROPLASTY Left 01/31/2021   [facility]   Family History  Problem Relation Age of Onset   Diabetes Mother    High blood pressure Mother    Heart disease Father    Cancer Brother    Mental illness Neg Hx    Social History   Tobacco Use   Smoking status: Never   Smokeless tobacco: Never  Substance Use Topics   Alcohol use: No    Alcohol/week: 0.0 standard drinks of alcohol    Comment: rare   Allergies  Allergen Reactions   Hydrocodone-Acetaminophen Other (See Comments)    Extreme headache   Ace Inhibitors Cough   Hctz [Hydrochlorothiazide] Rash    face   Prior to Admission medications   Medication Sig Start Date End Date Taking? Authorizing Provider  acetaminophen (TYLENOL) 650 MG CR tablet Take 650-1,300 mg by mouth every 8 (eight) hours as needed for pain.   Yes [provider]  apixaban (ELIQUIS) 5 MG TABS tablet Take 1 tablet (5 mg total) by mouth 2 (two) times daily. 05/06/22  Yes Agbor-Etang, Aaron Edelman, MD  atorvastatin (LIPITOR) 10 MG tablet TAKE 1 TABLET BY MOUTH EVERY DAY 03/09/22  Yes Sowles, Drue Stager, MD  diltiazem (DILACOR XR) 180 MG 24 hr capsule Take 1 capsule (180 mg total) by mouth daily. 04/04/22  Yes Kate Sable, MD  flecainide (TAMBOCOR) 50 MG tablet Take 1 tablet (50 mg total) by mouth 2 (two) times daily. 04/10/22  Yes Agbor-Etang, Aaron Edelman, MD  furosemide (LASIX) 40 MG tablet Take 1 tablet (40 mg total) by mouth daily. 03/31/22  Yes Sowles, Drue Stager, MD  gabapentin (NEURONTIN) 100 MG capsule Take 100 mg by mouth at bedtime. 12/20/21  Yes [provider]  Metoprolol Tartrate 75 MG TABS Take 150 mg by mouth 2 (two) times daily. 03/31/22  Yes Sowles, Drue Stager, MD  Multiple Vitamin (MULTIVITAMIN) tablet Take 1 tablet by mouth daily. BariatricPal Multivitamin   Yes [provider]  olmesartan (BENICAR) 20 MG  tablet Take 1 tablet (20 mg total) by mouth daily. 03/31/22  Yes Sowles, Drue Stager, MD  pantoprazole (PROTONIX) 40 MG tablet Take 1 tablet (40 mg total) by mouth daily. 05/29/22 07/13/22 Yes Baldwin Jamaica, PA-C  potassium chloride (KLOR-CON M) 10 MEQ tablet Take 1 tablet (10 mEq total) by mouth daily. 05/08/22  Yes Gasper Hopes A, FNP  Semaglutide, 2 MG/DOSE, (OZEMPIC, 2 MG/DOSE,) 8 MG/3ML SOPN Inject 2 mg into the skin once a week. Patient taking differently: Inject 2 mg into the skin once a week. Inject once weekly on Sunday 11/14/21  Yes Danford, Valetta Fuller D, NP  spironolactone (ALDACTONE) 25 MG tablet Take 0.5 tablets (12.5 mg total) by mouth daily. 04/28/22  Yes Zaide Mcclenahan, Otila Kluver A, FNP  triamcinolone (NASACORT) 55 MCG/ACT AERO nasal inhaler Place 2 sprays into the nose daily.   Yes [provider]  venlafaxine XR (EFFEXOR-XR) 75 MG 24 hr capsule Take 1 capsule (75 mg total) by mouth daily with breakfast. 04/25/22  Yes Eappen, Ria Clock, MD  Vitamin D, Ergocalciferol, (DRISDOL) 1.25 MG (50000 UNIT) CAPS capsule Take 1 capsule (50,000 Units total) by mouth every 7 (seven) days. Patient taking differently: Take 50,000 Units by mouth every 7 (seven) days. Take once weekly on Mondays 03/31/22  Yes Sowles, Drue Stager, MD  famotidine (PEPCID) 20 MG tablet Take 1 tablet (20 mg total) by mouth daily as needed for indigestion. 01/22/22   Steele Sizer, MD   Review of Systems  Constitutional:  Positive for fatigue (easily). Negative for appetite change.  HENT:  Negative for congestion, postnasal drip and sore throat.   Eyes: Negative.   Respiratory:  Positive for shortness of breath (minimal). Negative for cough, chest tightness and wheezing.   Cardiovascular:  Positive for leg swelling. Negative for chest pain and palpitations.  Gastrointestinal:  Negative for abdominal distention and abdominal pain.  Endocrine: Negative.   Genitourinary: Negative.   Musculoskeletal:  Positive for back pain.  Skin: Negative.    Allergic/Immunologic: Negative.   Neurological:  Negative for dizziness, weakness and light-headedness.  Hematological:  Negative for adenopathy. Does not bruise/bleed easily.  Psychiatric/Behavioral:  Negative for dysphoric mood and sleep disturbance (sleeping on 1-2 pillows). The patient is not nervous/anxious.    Vitals:   06/24/22 0839  BP: 133/81  Pulse: 84  SpO2: 97%  Weight: 263 lb 6.4 oz (119.5 kg)   Wt Readings from Last 3 Encounters:  06/24/22 263 lb 6.4 oz (119.5 kg)  06/12/22 259 lb (117.5 kg)  05/29/22 252 lb (114.3 kg)   Lab Results  Component Value Date   CREATININE 0.72 05/14/2022   CREATININE 0.77 05/08/2022   CREATININE 0.72 04/28/2022   Physical Exam Vitals and nursing note reviewed.  Constitutional:      Appearance: Normal appearance.  HENT:     Head: Normocephalic and atraumatic.  Cardiovascular:     Rate and Rhythm: Normal rate and regular rhythm.     Heart sounds: Murmur (II/VI) heard.  Pulmonary:     Effort: Pulmonary effort is normal. No respiratory distress.     Breath sounds: No wheezing or rales.  Abdominal:     General: There is no distension.     Palpations: Abdomen is soft.     Tenderness: There is no abdominal tenderness.  Musculoskeletal:        General: No tenderness.     Cervical back: Normal range of motion and neck supple.     Right lower leg: No tenderness. No edema.     Left lower leg: No tenderness. No edema.  Skin:    General: Skin is warm and dry.  Neurological:     General: No focal deficit present.     Mental Status: She is alert and oriented to person, place, and time.  Psychiatric:        Mood and Affect: Mood normal.        Behavior: Behavior normal.        Thought Content: Thought content normal.    Assessment & Plan:  1: Chronic heart failure with reduced ejection fraction- - NYHA class III - euvolemic today - weighing daily; reminded to call for an overnight weight gain of > 2 pounds or a weekly weight  gain of > 5 pounds - weight up 7 pounds from last visit here 1 month ago - not adding salt and reading food  labels to keep daily sodium intake to < '2000mg'$   - furosemide '40mg'$  daily - metoprolol tartrate '150mg'$  BID - olmesartan '20mg'$  daily - potassium 40mq daily - spironolactone 12.'5mg'$  daily - begin jardiance '10mg'$  daily; [place] discussed changing her benicar to entresto in the future as well - saw cardiology (Agbor-Etang) 04/29/22 - BNP 03/02/22 was 577.9  2: HTN- - BP 133/81 - had video visit with PCP (Reece Packer 03/19/22 - BMP 05/14/22 reviewed and showed sodium 137, potassium 3.9, creatinine 0.72 & GFR >60  3: Atrial fibrillation- - saw EP (Quentin Ore 05/14/22; goes to afib clinic later this week - has had previous cardioversion - had ablation 05/29/22 - eliquis '5mg'$  BID - diltiazem XR '180mg'$  daily - metoprolol tartrate '150mg'$  BID - flecainide '50mg'$  BID - encouraged her to discuss w/ afib clinic if it's now possible to reduce her metoprolol dose as that could be contributing to her fatigue  4: DM- - A1c 02/10/22 was 6.0% - currently using ozempic - [facility]   Medication bottles reviewed.   Return in 3 weeks, sooner if needed

## 2022-06-24 ENCOUNTER — Ambulatory Visit: Payer: Medicare HMO | Attending: Family | Admitting: Family

## 2022-06-24 ENCOUNTER — Encounter: Payer: Self-pay | Admitting: Family

## 2022-06-24 VITALS — BP 133/81 | HR 84 | Wt 263.4 lb

## 2022-06-24 DIAGNOSIS — M7989 Other specified soft tissue disorders: Secondary | ICD-10-CM | POA: Insufficient documentation

## 2022-06-24 DIAGNOSIS — Z8673 Personal history of transient ischemic attack (TIA), and cerebral infarction without residual deficits: Secondary | ICD-10-CM | POA: Diagnosis not present

## 2022-06-24 DIAGNOSIS — I110 Hypertensive heart disease with heart failure: Secondary | ICD-10-CM | POA: Insufficient documentation

## 2022-06-24 DIAGNOSIS — F32A Depression, unspecified: Secondary | ICD-10-CM | POA: Insufficient documentation

## 2022-06-24 DIAGNOSIS — I10 Essential (primary) hypertension: Secondary | ICD-10-CM

## 2022-06-24 DIAGNOSIS — K589 Irritable bowel syndrome without diarrhea: Secondary | ICD-10-CM | POA: Insufficient documentation

## 2022-06-24 DIAGNOSIS — Z7985 Long-term (current) use of injectable non-insulin antidiabetic drugs: Secondary | ICD-10-CM | POA: Insufficient documentation

## 2022-06-24 DIAGNOSIS — K219 Gastro-esophageal reflux disease without esophagitis: Secondary | ICD-10-CM | POA: Insufficient documentation

## 2022-06-24 DIAGNOSIS — Z7901 Long term (current) use of anticoagulants: Secondary | ICD-10-CM | POA: Diagnosis not present

## 2022-06-24 DIAGNOSIS — G4730 Sleep apnea, unspecified: Secondary | ICD-10-CM | POA: Diagnosis not present

## 2022-06-24 DIAGNOSIS — I4819 Other persistent atrial fibrillation: Secondary | ICD-10-CM | POA: Diagnosis not present

## 2022-06-24 DIAGNOSIS — I5022 Chronic systolic (congestive) heart failure: Secondary | ICD-10-CM | POA: Diagnosis not present

## 2022-06-24 DIAGNOSIS — R69 Illness, unspecified: Secondary | ICD-10-CM | POA: Diagnosis not present

## 2022-06-24 DIAGNOSIS — I480 Paroxysmal atrial fibrillation: Secondary | ICD-10-CM | POA: Diagnosis not present

## 2022-06-24 DIAGNOSIS — E119 Type 2 diabetes mellitus without complications: Secondary | ICD-10-CM | POA: Diagnosis not present

## 2022-06-24 DIAGNOSIS — Z79899 Other long term (current) drug therapy: Secondary | ICD-10-CM | POA: Insufficient documentation

## 2022-06-24 DIAGNOSIS — R5383 Other fatigue: Secondary | ICD-10-CM | POA: Diagnosis not present

## 2022-06-24 DIAGNOSIS — F419 Anxiety disorder, unspecified: Secondary | ICD-10-CM | POA: Diagnosis not present

## 2022-06-24 MED ORDER — EMPAGLIFLOZIN 10 MG PO TABS: 10.0000 mg | ORAL_TABLET | Freq: Every day | ORAL | 3 refills | Status: DC

## 2022-06-24 MED ORDER — EMPAGLIFLOZIN 10 MG PO TABS: 10.0000 mg | ORAL_TABLET | Freq: Every day | ORAL | 5 refills | Status: DC

## 2022-06-24 NOTE — Patient Instructions (Addendum)
Start taking jardiance as 1 tablet every morning.   Bring your W2 and social security statement back so we can make a copy and send it in with the patient assistance forms

## 2022-06-24 NOTE — Progress Notes (Unsigned)
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  2.  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  3.  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    MR Angio neck and head with and without contrast was normal   Vertigo: she was diagnosed with BPPV previously had Epley maneuver,, she had vestibular rehab but still needs to take meclizine occasionally and needs a refill today   Afib: She had an ablation done on 05/29/2022 and doing well since , still on Eliquis She is following up with afib clinic this week   CHF chronic combined  systolic/diastolic  : Echo in [id number] showed EF 60 % and in Nov 2023 it was down to 40-45%, she is going to CHF clinic and is compliant with her medications. She has SOB with mild activity but improves with rest. She states lower extremity edema is very mild She is not sure if she has orthopnea since she does not lay flat in bed. She also has mild pulmonary hypertension and some mild to moderate MR and moderate to severe TR. She was started on Jardiance this week    MDD/GAD: she  is under the care of Dr. Shea Evans . 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 She is compliant with medication and is taking Effexor 75 mg  as prescribed. Phq 9 is negative   Insomnia: she states Dr. Shea Evans , she was given rx of Rozerem but states it caused nightmares, she was given Ambien but she states does not take it due to nightmares and worries about side effects . She states unable to use CPAP since it keeps her from sleeping, advised to discuss  it again with Dr. Shea Evans    HTN: she is currently on cardizem, metoprolol , aldactone and Benicar. No chest pain or palpitation . She has SOB with activity due to CHF. She has been compliant with low salt diet and medications   OSA/Pulmonary hypertension: explained importance of resuming using her CPAP m: she states only wears CPAP machine occasionally   Morbid obesity: [facility]  [facility] She has a history of sleeve  bariatric surgery about 10 years ago, her weight prior to surgery was 300 lbs and  she lost 50 lbs and has been stable. Weight at home has been between 255-260 lbs at home. She will discussed CHF rehab to improve physical activity   History of bilateral knee replacement: right in 2021 and left Oct 2022, she is only using a cane now when walking long distance, pain is mild and stable.    DMII: with hypertension and dyslipidemia, diagnosed by the [facility]. She is still taking Ozempic , no side effects. She  denies polyphagia, polyuria or polydipsia. A1C is at goal . Positive for urine micro in October, started on Jardiance by chf clinic and is also taking ARB  Patient Active Problem List   Diagnosis Date Noted   Abnormal CT of the abdomen 05/06/2022   Primary insomnia  04/25/2022   Chronic diastolic heart failure ([place]) 03/06/2022   Obesity (BMI 30-39.9) 03/04/2022   HFrEF (heart failure with reduced ejection fraction) ([place]) 03/03/2022   Primary hypertension 03/03/2022   Atrial fibrillation with RVR ([place]) 03/02/2022   Transaminitis 03/02/2022   Dilated cardiomyopathy ([place]) 03/02/2022   Nonrheumatic tricuspid valve regurgitation 03/02/2022   Osteopenia of necks of both femurs 01/22/2022   Bilateral lower extremity edema 09/30/2021   Acute on chronic combined systolic and diastolic CHF (congestive heart failure) ([place]) 09/10/2021   History of total bilateral knee replacement 09/10/2021   History of blood transfusion 09/10/2020   Current use of  long term anticoagulation    Diabetes mellitus ([place])    Paroxysmal atrial fibrillation ([place])    GERD without esophagitis    Depression    Vitamin D deficiency 04/12/2020   History of CVA (cerebrovascular accident) without residual deficits 03/05/2020   History of hysterectomy 07/19/2019   Anxiety 07/19/2019   MDD (major depressive disorder), recurrent, in full remission ([place]) 07/19/2019   Insomnia due to mental condition 07/19/2019   Bilateral carpal tunnel syndrome 07/30/2016   OSA (obstructive sleep apnea) 03/07/2016   BPPV (benign paroxysmal positional vertigo) 08/28/2015   Hypertension associated with type 2 diabetes mellitus ([place]) 11/09/2014   GAD (generalized anxiety disorder) 11/09/2014   Acanthosis nigricans 11/09/2014   Morbid obesity ([place]) 01/06/2012    Past Surgical History:  Procedure Laterality Date   ABDOMINAL HYSTERECTOMY     ATRIAL FIBRILLATION ABLATION N/A 05/29/2022   Procedure: ATRIAL FIBRILLATION ABLATION;  Surgeon: Vickie Epley, MD;  Location: [facility];  Service: Cardiovascular;  Laterality: N/A;   BREAST EXCISIONAL BIOPSY Left    CARDIOVERSION N/A 03/05/2022   Procedure: CARDIOVERSION;  Surgeon: Kate Sable, MD;  Location: [facility];  Service: Cardiovascular;  Laterality: N/A;   ENDOMETRIAL ABLATION     x2   ENDOSCOPIC CONCHA BULLOSA RESECTION Bilateral 08/08/2020   Procedure: ENDOSCOPIC CONCHA BULLOSA RESECTION;  Surgeon: Margaretha Sheffield, MD;  Location: [facility];  Service: ENT;  Laterality: Bilateral;   ETHMOIDECTOMY Right 08/08/2020   Procedure: ETHMOIDECTOMY, LEFT PARTIAL ETHMOIDECTOMY;  Surgeon: Margaretha Sheffield, MD;  Location: [facility];  Service: ENT;  Laterality: Right;   IMAGE GUIDED SINUS SURGERY N/A 08/08/2020   Procedure: IMAGE GUIDED SINUS SURGERY, RIGHT FRONTAL SINUSOTOMY;  Surgeon: Margaretha Sheffield, MD;  Location: [facility];  Service: ENT;  Laterality: N/A;   LAPAROSCOPIC SLEEVE GASTRECTOMY     MAXILLARY ANTROSTOMY Bilateral  08/08/2020   Procedure: MAXILLARY ANTROSTOMY with tissue;  Surgeon: Margaretha Sheffield, MD;  Location: [facility];  Service: ENT;  Laterality: Bilateral;   NASAL TURBINATE REDUCTION  08/27/2020   Procedure: TURBINATE REDUCTION /SUBMUCOSAL DEBRIDEMENT;  Surgeon: Margaretha Sheffield, MD;  Location: [facility];  Service: ENT;;   TOTAL KNEE ARTHROPLASTY Right 06/2019   TOTAL KNEE ARTHROPLASTY Left 01/31/2021   [facility]    Family History  Problem Relation Age of Onset   Diabetes Mother    High blood pressure Mother    Heart disease Father    Cancer Brother    Mental illness Neg Hx     Social History   Tobacco Use   Smoking status: Never   Smokeless tobacco: Never  Substance Use Topics   Alcohol use: No    Alcohol/week: 0.0 standard drinks of alcohol    Comment: rare     Current Outpatient Medications:    acetaminophen (TYLENOL) 650 MG CR tablet, Take 650-1,300 mg by mouth every 8 (eight)  hours as needed for pain., Disp: , Rfl:    apixaban (ELIQUIS) 5 MG TABS tablet, Take 1 tablet (5 mg total) by mouth 2 (two) times daily., Disp: 180 tablet, Rfl: 3   atorvastatin (LIPITOR) 10 MG tablet, TAKE 1 TABLET BY MOUTH EVERY DAY, Disp: 90 tablet, Rfl: 3   diltiazem (DILACOR XR) 180 MG 24 hr capsule, Take 1 capsule (180 mg total) by mouth daily., Disp: 90 capsule, Rfl: 0   empagliflozin (JARDIANCE) 10 MG TABS tablet, Take 1 tablet (10 mg total) by mouth daily before breakfast., Disp: 30 tablet, Rfl: 5   empagliflozin (JARDIANCE) 10 MG TABS tablet, Take 1 tablet (10 mg total) by mouth daily before breakfast., Disp: 90 tablet, Rfl: 3   famotidine (PEPCID) 20 MG tablet, Take 1 tablet (20 mg total) by mouth daily as needed for indigestion., Disp: 90 tablet, Rfl: 1   flecainide (TAMBOCOR) 50 MG tablet, Take 1 tablet (50 mg total) by mouth 2 (two) times daily., Disp: 180 tablet, Rfl: 0   furosemide (LASIX) 40 MG tablet, Take 1 tablet (40 mg total) by mouth daily., Disp: 90 tablet, Rfl: 0    gabapentin (NEURONTIN) 100 MG capsule, Take 100 mg by mouth at bedtime., Disp: , Rfl:    Metoprolol Tartrate 75 MG TABS, Take 150 mg by mouth 2 (two) times daily., Disp: 360 tablet, Rfl: 0   Multiple Vitamin (MULTIVITAMIN) tablet, Take 1 tablet by mouth daily. BariatricPal Multivitamin, Disp: , Rfl:    olmesartan (BENICAR) 20 MG tablet, Take 1 tablet (20 mg total) by mouth daily., Disp: 90 tablet, Rfl: 0   pantoprazole (PROTONIX) 40 MG tablet, Take 1 tablet (40 mg total) by mouth daily., Disp: 45 tablet, Rfl: 0   potassium chloride (KLOR-CON M) 10 MEQ tablet, Take 1 tablet (10 mEq total) by mouth daily., Disp: 180 tablet, Rfl: 0   Semaglutide, 2 MG/DOSE, (OZEMPIC, 2 MG/DOSE,) 8 MG/3ML SOPN, Inject 2 mg into the skin once a week., Disp: 3 mL, Rfl: 0   spironolactone (ALDACTONE) 25 MG tablet, Take 0.5 tablets (12.5 mg total) by mouth daily., Disp: 45 tablet, Rfl: 3   triamcinolone (NASACORT) 55 MCG/ACT AERO nasal inhaler, Place 2 sprays into the nose daily., Disp: , Rfl:    venlafaxine XR (EFFEXOR-XR) 75 MG 24 hr capsule, Take 1 capsule (75 mg total) by mouth daily with breakfast., Disp: 90 capsule, Rfl: 1   Vitamin D, Ergocalciferol, (DRISDOL) 1.25 MG (50000 UNIT) CAPS capsule, Take 1 capsule (50,000 Units total) by mouth every 7 (seven) days., Disp: 12 capsule, Rfl: 1  Allergies  Allergen Reactions   Hydrocodone-Acetaminophen Other (See Comments)    Extreme headache   Ace Inhibitors Cough   Hctz [Hydrochlorothiazide] Rash    face    I personally reviewed active problem list, medication list, allergies, family history, social history, health maintenance with the patient/caregiver today.   ROS   Constitutional: Negative for fever or weight change.  Respiratory: Negative for cough , positive for  shortness of breath.   Cardiovascular: Negative for chest pain or palpitations.  Gastrointestinal: Negative for abdominal pain, no bowel changes.  Musculoskeletal: Negative for gait problem or  joint swelling.  Skin: Negative for rash.  Neurological: Negative for dizziness or headache.  No other specific complaints in a complete review of systems (except as listed in HPI above).   Objective  Vitals:   06/25/22 0814  BP: 128/76  Pulse: 94  Resp: 16  SpO2: 98%  Weight: 261 lb (118.4 kg)  Height: '5\' 8"'$  (1.727 m)    Body mass index is 39.68 kg/m.  Physical Exam  Constitutional: Patient appears well-developed and well-nourished. Obese  No distress.  HEENT: head atraumatic, normocephalic, pupils equal and reactive to light, neck supple Cardiovascular: Normal rate, regular rhythm and normal heart sounds.  No murmur heard. No BLE edema. Pulmonary/Chest: Effort normal and breath sounds normal. No respiratory distress. Abdominal: Soft.  There is no tenderness. Psychiatric: Patient has a normal mood and affect. behavior is normal. Judgment and thought content normal.    PHQ2/9:    06/25/2022    8:21 AM 04/25/2022    9:12 AM 04/08/2022    4:15 PM 03/19/2022    1:14 PM 03/10/2022    7:51 AM  Depression screen PHQ 2/9  Decreased Interest 0  0 0 0  Down, Depressed, Hopeless 0  0 0 0  PHQ - 2 Score 0  0 0 0  Altered sleeping 0    0  Tired, decreased energy 2    0  Change in appetite 0    0  Feeling bad or failure about yourself  0    0  Trouble concentrating 0    0  Moving slowly or fidgety/restless 0    0  Suicidal thoughts 0    0  PHQ-9 Score 2    0     Information is confidential and restricted. Go to Review Flowsheets to unlock data.    phq 9 is negative   Fall Risk:    06/25/2022    8:20 AM 04/08/2022    4:15 PM 03/19/2022    1:13 PM 03/10/2022    7:51 AM 02/12/2022   10:12 AM  Fall Risk   Falls in the past year? 0 0 0 0 0  Number falls in past yr: 0 0 0 0 0  Injury with Fall? 0  0 0 0  Risk for fall due to : No Fall Risks Medication side effect  No Fall Risks   Follow up Falls prevention discussed Falls prevention discussed Falls evaluation completed  Falls prevention discussed Falls evaluation completed      Functional Status Survey: Is the patient deaf or have difficulty hearing?: No Does the patient have difficulty seeing, even when wearing glasses/contacts?: No Does the patient have difficulty concentrating, remembering, or making decisions?: No Does the patient have difficulty walking or climbing stairs?: No Does the patient have difficulty dressing or bathing?: No Does the patient have difficulty doing errands alone such as visiting a doctor's office or shopping?: No    Assessment & Plan  1. Controlled type 2 diabetes mellitus with microalbuminuria, without long-term current use of insulin (HCC)  - POCT HgB A1C  2. Chronic combined systolic and diastolic Heart Failure  (HCC)  - olmesartan (BENICAR) 20 MG tablet; Take 1 tablet (20 mg total) by mouth daily.  Dispense: 90 tablet; Refill: 1  3. Morbid obesity ([place])  Discussed with the patient the risk posed by an increased BMI. Discussed importance of portion control, calorie counting and at least 150 minutes of physical activity weekly. Avoid sweet beverages and drink more water. Eat at least 6 servings of fruit and vegetables daily    4. Hypertension associated with type 2 diabetes mellitus ([place])  At goal   5. MDD (major depressive disorder), recurrent, in full remission ([place])  Under the care of Dr. Shea Evans   6. Atrial fibrillation with RVR ([place])  S/p ablation   7. Vitamin D deficiency  Continue supplementation  8. GAD (generalized anxiety disorder)   9. GERD without esophagitis  - famotidine (PEPCID) 20 MG tablet; Take 1 tablet (20 mg total) by mouth daily as needed for indigestion.  Dispense: 90 tablet; Refill: 1 - pantoprazole (PROTONIX) 40 MG tablet; Take 1 tablet (40 mg total) by mouth daily.  Dispense: 90 tablet; Refill: 1 .   10. Benign paroxysmal positional vertigo, unspecified laterality  - meclizine (ANTIVERT) 12.5 MG tablet; Take 1 tablet (12.5  mg total) by mouth 3 (three) times daily as needed for dizziness.  Dispense: 30 tablet; Refill: 0

## 2022-06-25 ENCOUNTER — Ambulatory Visit (INDEPENDENT_AMBULATORY_CARE_PROVIDER_SITE_OTHER): Payer: Medicare HMO | Admitting: Family Medicine

## 2022-06-25 ENCOUNTER — Encounter: Payer: Self-pay | Admitting: Family Medicine

## 2022-06-25 ENCOUNTER — Ambulatory Visit (INDEPENDENT_AMBULATORY_CARE_PROVIDER_SITE_OTHER): Payer: Medicare HMO

## 2022-06-25 VITALS — BP 128/76 | HR 94 | Resp 16 | Ht 68.0 in | Wt 261.0 lb

## 2022-06-25 DIAGNOSIS — K219 Gastro-esophageal reflux disease without esophagitis: Secondary | ICD-10-CM

## 2022-06-25 DIAGNOSIS — I5042 Chronic combined systolic (congestive) and diastolic (congestive) heart failure: Secondary | ICD-10-CM | POA: Diagnosis not present

## 2022-06-25 DIAGNOSIS — I152 Hypertension secondary to endocrine disorders: Secondary | ICD-10-CM

## 2022-06-25 DIAGNOSIS — E559 Vitamin D deficiency, unspecified: Secondary | ICD-10-CM

## 2022-06-25 DIAGNOSIS — I4891 Unspecified atrial fibrillation: Secondary | ICD-10-CM

## 2022-06-25 DIAGNOSIS — E1159 Type 2 diabetes mellitus with other circulatory complications: Secondary | ICD-10-CM | POA: Diagnosis not present

## 2022-06-25 DIAGNOSIS — R69 Illness, unspecified: Secondary | ICD-10-CM | POA: Diagnosis not present

## 2022-06-25 DIAGNOSIS — E119 Type 2 diabetes mellitus without complications: Secondary | ICD-10-CM

## 2022-06-25 DIAGNOSIS — R809 Proteinuria, unspecified: Secondary | ICD-10-CM

## 2022-06-25 DIAGNOSIS — H8110 Benign paroxysmal vertigo, unspecified ear: Secondary | ICD-10-CM

## 2022-06-25 DIAGNOSIS — E1129 Type 2 diabetes mellitus with other diabetic kidney complication: Secondary | ICD-10-CM | POA: Diagnosis not present

## 2022-06-25 DIAGNOSIS — F3342 Major depressive disorder, recurrent, in full remission: Secondary | ICD-10-CM

## 2022-06-25 DIAGNOSIS — F411 Generalized anxiety disorder: Secondary | ICD-10-CM

## 2022-06-25 LAB — POCT GLYCOSYLATED HEMOGLOBIN (HGB A1C): Hemoglobin A1C: 6.1 % — AB (ref 4.0–5.6)

## 2022-06-25 MED ORDER — FAMOTIDINE 20 MG PO TABS: 20.0000 mg | ORAL_TABLET | Freq: Every day | ORAL | 1 refills | Status: DC | PRN

## 2022-06-25 MED ORDER — OZEMPIC (2 MG/DOSE) 8 MG/3ML ~~LOC~~ SOPN: 2.0000 mg | PEN_INJECTOR | SUBCUTANEOUS | 1 refills | Status: DC

## 2022-06-25 MED ORDER — OLMESARTAN MEDOXOMIL 20 MG PO TABS: 20.0000 mg | ORAL_TABLET | Freq: Every day | ORAL | 1 refills | Status: DC

## 2022-06-25 MED ORDER — PANTOPRAZOLE SODIUM 40 MG PO TBEC: 40.0000 mg | DELAYED_RELEASE_TABLET | Freq: Every day | ORAL | 1 refills | Status: DC

## 2022-06-25 MED ORDER — MECLIZINE HCL 12.5 MG PO TABS: 12.5000 mg | ORAL_TABLET | Freq: Three times a day (TID) | ORAL | 0 refills | Status: DC | PRN

## 2022-06-25 NOTE — Chronic Care Management (AMB) (Signed)
Chronic Care Management   CCM RN Visit Note   Name: Regina Christensen MRN: [id number] DOB: 14-Nov-1955  Subjective: Regina Christensen is a 67 y.o. year old female who is a primary care patient of Regina Cory, MD. The patient was referred to the Chronic Care Management team for assistance with care management needs subsequent to provider initiation of CCM services and plan of care.    Today's Visit:  Engaged with patient by telephone for follow up visit.      Goals Addressed             This Visit's Progress    Goal: CCM (Congestive Heart Failure) Expected Outcome: Monitor, Self-Manage and Reduce Symptoms of Congestive Heart Failure       Current Barriers:  Chronic Disease Management support and education needs related to CHF  Planned Interventions: Reviewed plan for CHF management.  Reviewed recommended weight parameters along with s/sx of fluid overload. Reports weighting as recommended. Reports weights have been within range. Notes minimal swelling to her lower extremities. Denies abdominal edema. Continues to experience shortness of breath with exertional. Denies episodes at rest. Denies changes or decline in functional status. Encouraged to continue monitoring symptoms daily and call as needed with concerns. Reports monitoring nutritional intake and sodium consumption as advised. Reviewed pending appointments. Will follow up with Inetta Fermo Hackney/Heart Failure later this month. Reviewed worsening s/sx related to CHF exacerbation and indications for seeking immediate medical attention.   Symptom Management: Take medications as prescribed   Completed outreach with the Heart Failure team as scheduled Call pharmacy for medication refills 3-7 days in advance of running out of medications Continue monitoring sodium consumption Notify provider for weight gain greater than 3 lbs overnight or weight gain greater than 5 lbs within a week.  Call provider office for new concerns or  questions   Follow Up Plan:   Will follow up next month       Goal: CCM (Diabetes) Expected Outcome: Monitor, Self-Manage and Reduce Symptoms of Diabetes       Current Barriers:  Chronic Disease Management support and education needs related to Diabetes  Planned Interventions: Reviewed plan for management of Diabetes. Remains compliant with medications. Reports tolerating Ozempic well. Pending follow-up regarding status of medication assistance. Reviewed blood glucose readings. Reports monitoring consistently as advised. Reports fasting readings have ranged from the 90s to 110s. Denies hypoglycemic or hyperglycemic episodes. Reviewed importance of increasing intake of vegetables and including lean proteins. Reports continues improvement with nutritional intake.    Lab Results  Component Value Date   HGBA1C 6.0 (H) 02/10/2022    Symptom Management: Take medications as prescribed   Attend all scheduled provider appointments Call pharmacy for medication refills 3-7 days in advance of running out of medications Check fasting blood sugars and record readings Keep appointment with eye doctor Check feet daily for cuts, sores or redness Read food labels for fat, fiber, carbohydrates and portion size Call provider office for new concerns or questions    Follow Up Plan:  Will follow up next month       Goal: CCM (Hypertension) Expected Outcome: Monitor, Self-Manage and Reduce Symptoms of Hypertension       Current Barriers:  Chronic Disease Management support and education needs related to Hypertension  Planned Interventions: Reviewed plan for hypertension management. Reports excellent compliance with medications. Reviewed established blood pressure parameters. Advised to try and monitor a few times a week and maintain a log. Reviewed indications for notifying PCP or  the Cardiology team. Reviewed symptoms. Denies episodes of chest discomfort, palpitations, headaches, or visual  changes. Reports some episodes of shortness of breath with prolonged walking or activity. Denies episodes at rest. Reviewed nutritional intake. Encouraged to continue readings nutrition labels, monitor sodium intake, and avoid highly processed foods when possible. Reviewed s/sx of heart attack, stroke and worsening symptoms that require immediate medical attention.    Symptom Management: Take medications exactly as prescribed   Attend all scheduled provider appointments Call pharmacy for medication refills 3-7 days in advance of running out of medications Call provider office for new concerns or questions  Check blood pressure and keep a log Call doctor for signs and symptoms of high blood pressure Report new symptoms to your doctor Eat whole grains, fruits and vegetables, lean meats and healthy fats Limit salt intake   Follow Up Plan:   Will follow up next month            PLAN: Will follow up next month   Katha Cabal RN Care Manager/Chronic Care Management [phone]

## 2022-06-26 ENCOUNTER — Ambulatory Visit (HOSPITAL_COMMUNITY)
Admission: RE | Admit: 2022-06-26 | Discharge: 2022-06-26 | Disposition: A | Discharge status: Home / Self Care | Payer: Medicare HMO | Source: Ambulatory Visit | Attending: Physician Assistant | Admitting: Physician Assistant

## 2022-06-26 ENCOUNTER — Other Ambulatory Visit: Payer: Self-pay | Admitting: Family Medicine

## 2022-06-26 VITALS — BP 142/84 | HR 83 | Ht 68.0 in | Wt 262.4 lb

## 2022-06-26 DIAGNOSIS — Z8673 Personal history of transient ischemic attack (TIA), and cerebral infarction without residual deficits: Secondary | ICD-10-CM | POA: Diagnosis not present

## 2022-06-26 DIAGNOSIS — Z79899 Other long term (current) drug therapy: Secondary | ICD-10-CM | POA: Diagnosis not present

## 2022-06-26 DIAGNOSIS — I4819 Other persistent atrial fibrillation: Secondary | ICD-10-CM | POA: Diagnosis not present

## 2022-06-26 DIAGNOSIS — Z6839 Body mass index (BMI) 39.0-39.9, adult: Secondary | ICD-10-CM | POA: Insufficient documentation

## 2022-06-26 DIAGNOSIS — E119 Type 2 diabetes mellitus without complications: Secondary | ICD-10-CM | POA: Diagnosis not present

## 2022-06-26 DIAGNOSIS — Z7901 Long term (current) use of anticoagulants: Secondary | ICD-10-CM | POA: Diagnosis not present

## 2022-06-26 DIAGNOSIS — I110 Hypertensive heart disease with heart failure: Secondary | ICD-10-CM | POA: Insufficient documentation

## 2022-06-26 DIAGNOSIS — Z7985 Long-term (current) use of injectable non-insulin antidiabetic drugs: Secondary | ICD-10-CM | POA: Insufficient documentation

## 2022-06-26 DIAGNOSIS — D6869 Other thrombophilia: Secondary | ICD-10-CM | POA: Insufficient documentation

## 2022-06-26 DIAGNOSIS — Z7984 Long term (current) use of oral hypoglycemic drugs: Secondary | ICD-10-CM | POA: Diagnosis not present

## 2022-06-26 DIAGNOSIS — R600 Localized edema: Secondary | ICD-10-CM

## 2022-06-26 DIAGNOSIS — I5032 Chronic diastolic (congestive) heart failure: Secondary | ICD-10-CM

## 2022-06-26 DIAGNOSIS — I5022 Chronic systolic (congestive) heart failure: Secondary | ICD-10-CM | POA: Insufficient documentation

## 2022-06-26 DIAGNOSIS — G4733 Obstructive sleep apnea (adult) (pediatric): Secondary | ICD-10-CM | POA: Insufficient documentation

## 2022-06-26 DIAGNOSIS — E669 Obesity, unspecified: Secondary | ICD-10-CM | POA: Diagnosis not present

## 2022-06-26 NOTE — Progress Notes (Signed)
Primary Care Physician: Steele Sizer, MD Primary Cardiologist: Dr Garen Lah Primary Electrophysiologist: Dr Quentin Ore Referring Physician: Dr Jerene Bears is a 67 y.o. female with a history of HTN, CHF, CVA, OSA, T2DM, and atrial fibrillation who presents for follow up in the [facility].  The patient was initially started on flecainide for her afib and then underwent afib ablation with Dr Quentin Ore on 05/29/22. Patient is on Eliquis for a CHADS2VASC score of 7.  On follow up today, she is doing well since her ablation but still complains of feeling fatigued sometimes. She notes when she tries to walk for distance can get out of breath and needs to sit down to take breaks. She admits to not using her CPAP mask as it makes her feel like she is choking. She has not used CPAP mask in a while. She is followed by [facility].   She denies any chest pain or trouble swallowing. Her leg incision sites healed without issue. She has been compliant with Eliquis.  Today, she denies symptoms of palpitations, chest pain, shortness of breath, orthopnea, PND, lower extremity edema, dizziness, presyncope, syncope, snoring, daytime somnolence, bleeding, or neurologic sequela. The patient is tolerating medications without difficulties and is otherwise without complaint today.    Atrial Fibrillation Risk Factors:  she does have symptoms or diagnosis of sleep apnea. she is not compliant with CPAP therapy. she does not have a history of rheumatic fever. she does not have a history of alcohol use. The patient does have a history of early familial atrial fibrillation or other arrhythmias. 4 sons and 1 sister have afib.   she has a BMI of Body mass index is 39.9 kg/m.Marland Kitchen Filed Weights   06/26/22 0941  Weight: 119 kg    Family History  Problem Relation Age of Onset   Diabetes Mother    High blood pressure Mother    Heart disease Father     Cancer Brother    Mental illness Neg Hx     Atrial Fibrillation Management history:  Previous antiarrhythmic drugs: flecainide  Previous cardioversions: 03/05/22 Previous ablations: 05/29/22 CHADS2VASC score: 7 Anticoagulation history: Eliquis   Past Medical History:  Diagnosis Date   Anxiety    Arthritis    Back pain    BPPV (benign paroxysmal positional vertigo)    CHF (congestive heart failure) (HCC)    Chronic diastolic HF (heart failure) (HCC)    Complication of anesthesia    Post-operative hypoxia requiring supplemental oxygen   Current use of long term anticoagulation    Apixaban   CVA (cerebral vascular accident) ([place]) 02/28/2020   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; imaging from [facility]   Depression    Edema of both lower extremities    GERD (gastroesophageal reflux disease)    Hypertension    IBS (irritable bowel syndrome)    Lactose intolerance    Morbid obesity with BMI of 45.0-49.9, adult ([place])    Obesity    Osteoarthritis    PAF (paroxysmal atrial fibrillation) ([place])    Pre-diabetes    Sleep apnea    uses CPAP machine sometimes    Type 2 diabetes mellitus without complication ([place])    Vitamin D deficiency    Past Surgical History:  Procedure Laterality Date   ABDOMINAL HYSTERECTOMY     ATRIAL FIBRILLATION ABLATION N/A 05/29/2022   Procedure: ATRIAL FIBRILLATION ABLATION;  Surgeon: Vickie Epley, MD;  Location:  [facility];  Service: Cardiovascular;  Laterality: N/A;   BREAST EXCISIONAL BIOPSY Left    CARDIOVERSION N/A 03/05/2022   Procedure: CARDIOVERSION;  Surgeon: Kate Sable, MD;  Location: [facility] OR[facility];  Service: Cardiovascular;  Laterality: N/A;   ENDOMETRIAL ABLATION     x2   ENDOSCOPIC CONCHA BULLOSA RESECTION Bilateral 08/08/2020   Procedure: ENDOSCOPIC CONCHA BULLOSA RESECTION;  Surgeon: Margaretha Sheffield, MD;  Location: [facility] OR[facility];  Service: ENT;  Laterality: Bilateral;   ETHMOIDECTOMY Right 08/08/2020   Procedure:  ETHMOIDECTOMY, LEFT PARTIAL ETHMOIDECTOMY;  Surgeon: Margaretha Sheffield, MD;  Location: [facility] OR[facility];  Service: ENT;  Laterality: Right;   IMAGE GUIDED SINUS SURGERY N/A 08/08/2020   Procedure: IMAGE GUIDED SINUS SURGERY, RIGHT FRONTAL SINUSOTOMY;  Surgeon: Margaretha Sheffield, MD;  Location: [facility];  Service: ENT;  Laterality: N/A;   LAPAROSCOPIC SLEEVE GASTRECTOMY     MAXILLARY ANTROSTOMY Bilateral 08/08/2020   Procedure: MAXILLARY ANTROSTOMY with tissue;  Surgeon: Margaretha Sheffield, MD;  Location: [facility] OR[facility];  Service: ENT;  Laterality: Bilateral;   NASAL TURBINATE REDUCTION  08/27/2020   Procedure: TURBINATE REDUCTION /SUBMUCOSAL DEBRIDEMENT;  Surgeon: Margaretha Sheffield, MD;  Location: [facility];  Service: ENT;;   TOTAL KNEE ARTHROPLASTY Right 06/2019   TOTAL KNEE ARTHROPLASTY Left 01/31/2021   [facility]    Current Outpatient Medications  Medication Sig Dispense Refill   acetaminophen (TYLENOL) 650 MG CR tablet Take 650-1,300 mg by mouth every 8 (eight) hours as needed for pain.     apixaban (ELIQUIS) 5 MG TABS tablet Take 1 tablet (5 mg total) by mouth 2 (two) times daily. 180 tablet 3   atorvastatin (LIPITOR) 10 MG tablet TAKE 1 TABLET BY MOUTH EVERY DAY 90 tablet 3   diltiazem (DILACOR XR) 180 MG 24 hr capsule Take 1 capsule (180 mg total) by mouth daily. 90 capsule 0   famotidine (PEPCID) 20 MG tablet Take 1 tablet (20 mg total) by mouth daily as needed for indigestion. 90 tablet 1   flecainide (TAMBOCOR) 50 MG tablet Take 1 tablet (50 mg total) by mouth 2 (two) times daily. 180 tablet 0   furosemide (LASIX) 40 MG tablet Take 1 tablet (40 mg total) by mouth daily. 90 tablet 0   gabapentin (NEURONTIN) 100 MG capsule Take 100 mg by mouth at bedtime.     meclizine (ANTIVERT) 12.5 MG tablet Take 1 tablet (12.5 mg total) by mouth 3 (three) times daily as needed for dizziness. 30 tablet 0   Metoprolol Tartrate 75 MG TABS Take 150 mg by mouth 2 (two) times daily. 360 tablet 0   Multiple  Vitamin (MULTIVITAMIN) tablet Take 1 tablet by mouth daily. BariatricPal Multivitamin     olmesartan (BENICAR) 20 MG tablet Take 1 tablet (20 mg total) by mouth daily. 90 tablet 1   pantoprazole (PROTONIX) 40 MG tablet Take 1 tablet (40 mg total) by mouth daily. 90 tablet 1   potassium chloride (KLOR-CON M) 10 MEQ tablet Take 1 tablet (10 mEq total) by mouth daily. 180 tablet 0   Semaglutide, 2 MG/DOSE, (OZEMPIC, 2 MG/DOSE,) 8 MG/3ML SOPN Inject 2 mg into the skin once a week. 9 mL 1   spironolactone (ALDACTONE) 25 MG tablet Take 0.5 tablets (12.5 mg total) by mouth daily. 45 tablet 3   triamcinolone (NASACORT) 55 MCG/ACT AERO nasal inhaler Place 2 sprays into the nose daily.     venlafaxine XR (EFFEXOR-XR) 75 MG 24 hr capsule Take 1 capsule (75 mg total) by mouth daily with breakfast.  90 capsule 1   Vitamin D, Ergocalciferol, (DRISDOL) 1.25 MG (50000 UNIT) CAPS capsule Take 1 capsule (50,000 Units total) by mouth every 7 (seven) days. 12 capsule 1   empagliflozin (JARDIANCE) 10 MG TABS tablet Take 1 tablet (10 mg total) by mouth daily before breakfast. (Patient not taking: Reported on 06/26/2022) 30 tablet 5   No current facility-administered medications for this encounter.    Allergies  Allergen Reactions   Hydrocodone-Acetaminophen Other (See Comments)    Extreme headache   Ace Inhibitors Cough   Hctz [Hydrochlorothiazide] Rash    face    Social History   Socioeconomic History   Marital status: Divorced    Spouse name: Not on file   Number of children: 2   Years of education: Not on file   Highest education level: Associate degree: occupational, Hotel manager, or vocational program  Occupational History   Occupation: Retired/ work PT    Employer: [employer]  Tobacco Use   Smoking status: Never   Smokeless tobacco: Never  Vaping Use   Vaping Use: Never used  Substance and Sexual Activity   Alcohol use: No    Alcohol/week: 0.0 standard drinks of alcohol    Comment: rare   Drug use: No    Sexual activity: Not on file  Other Topics Concern   Not on file  Social History Narrative   Not on file   Social Determinants of Health   Financial Resource Strain: High Risk (01/22/2022)   Overall Financial Resource Strain (CARDIA)    Difficulty of Paying Living Expenses: Very hard  Food Insecurity: No Food Insecurity (04/08/2022)   Hunger Vital Sign    Worried About Running Out of Food in the Last Year: Never true    Ran Out of Food in the Last Year: Never true  Transportation Needs: No Transportation Needs (04/08/2022)   PRAPARE - Hydrologist (Medical): No    Lack of Transportation (Non-Medical): No  Physical Activity: Insufficiently Active (10/18/2021)   Exercise Vital Sign    Days of Exercise per Week: 3 days    Minutes of Exercise per Session: 10 min  Stress: No Stress Concern Present (04/08/2022)   Jay    Feeling of Stress : Only a little  Social Connections: Moderately Integrated (04/08/2022)   Social Connection and Isolation Panel [NHANES]    Frequency of Communication with Friends and Family: More than three times a week    Frequency of Social Gatherings with Friends and Family: More than three times a week    Attends Religious Services: More than 4 times per year    Active Member of [employer] or Organizations: Yes    Attends Archivist Meetings: Never    Marital Status: Divorced  Human resources officer Violence: Not At Risk (03/02/2022)   Humiliation, Afraid, Rape, and Kick questionnaire    Fear of Current or Ex-Partner: No    Emotionally Abused: No    Physically Abused: No    Sexually Abused: No     ROS- All systems are reviewed and negative except as per the HPI above.  Physical Exam: Vitals:   06/26/22 0941  BP: (!) 142/84  Pulse: 83  Weight: 119 kg  Height: '5\' 8"'$  (1.727 m)    GEN- The patient is a well appearing female, alert and oriented x 3  today.   Head- normocephalic, atraumatic Eyes-  Sclera clear, conjunctiva pink Ears- hearing intact Oropharynx- clear Neck-  supple  Lungs- Clear to ausculation bilaterally, normal work of breathing Heart- Regular rate and rhythm, no murmurs, rubs or gallops  GI- soft, NT, ND, + BS Extremities- no clubbing, cyanosis, or edema MS- no significant deformity or atrophy Skin- no rash or lesion Psych- euthymic mood, full affect Neuro- strength and sensation are intact  Wt Readings from Last 3 Encounters:  06/26/22 119 kg  06/25/22 118.4 kg  06/24/22 119.5 kg    EKG today demonstrates  SR with HR 83 PR interval 174 ms QRS duration 80 ms QT/Qtc 356/418 ms  Echo 03/02/22 demonstrated   1. Left ventricular ejection fraction, by estimation, is 40 to 45%. The  left ventricle has mildly decreased function. The left ventricle  demonstrates global hypokinesis. There is mild left ventricular  hypertrophy. Left ventricular diastolic parameters  are indeterminate.   2. Right ventricular systolic function is moderately reduced. The right  ventricular size is mildly enlarged. There is mildly elevated pulmonary  artery systolic pressure. The estimated right ventricular systolic  pressure is 99991111 mmHg.   3. Left atrial size was moderately dilated.   4. The mitral valve is normal in structure. Mild to moderate mitral valve  regurgitation. No evidence of mitral stenosis.   5. Tricuspid valve regurgitation is moderate to severe.   6. The aortic valve is normal in structure. Aortic valve regurgitation is  not visualized. Aortic valve sclerosis/calcification is present, without  any evidence of aortic stenosis.   7. The inferior vena cava is dilated in size with <50% respiratory  variability, suggesting right atrial pressure of 15 mmHg.   8. Rhythm is atrial fibrillation.   Epic records are reviewed at length today  CHA2DS2-VASc Score = 7  The patient's score is based upon: CHF History:  1 HTN History: 1 Diabetes History: 1 Stroke History: 2 Vascular Disease History: 0 Age Score: 1 Gender Score: 1      ASSESSMENT AND PLAN: 1. Persistent Atrial Fibrillation (ICD10:  I48.19) The patient's CHA2DS2-VASc score is 7, indicating a 11.2% annual risk of stroke.   S/p afib ablation 05/29/22  She is overall doing well since ablation. She is in SR today.  Continue Eliquis 5 mg BID with no missed doses for 3 months post ablation.  Continue diltiazem 180 mg daily Continue flecainide 50 mg BID for now, would like to eventually discontinue this post ablation, especially with decreased EF.  Continue Lopressor 150 mg BID  2. Secondary Hypercoagulable State (ICD10:  D68.69) The patient is at significant risk for stroke/thromboembolism based upon her CHA2DS2-VASc Score of 7.  Continue Apixaban (Eliquis).   3. Obesity Body mass index is 39.9 kg/m. Lifestyle modification was discussed at length including regular exercise and weight reduction. She is followed by [facility].  4. Obstructive sleep apnea Patient intolerant of CPAP mask. Advised to follow up to trial different mask as this may possibly contribute to her fatigue.   5. HTN Stable, no changes today. Continue regimen.  6. Chronic HFrEF EF 40-45% Fluid status appears stable today.  GDMT includes empagliflozin, spironolactone, metoprolol, and furosemide.  No changes to regimen today.    Follow up with Dr Quentin Ore as scheduled.    [facility] [address] [place], [place] [address] [phone] 06/26/2022 10:35 AM

## 2022-06-27 ENCOUNTER — Other Ambulatory Visit: Payer: Self-pay

## 2022-06-27 MED ORDER — FLECAINIDE ACETATE 50 MG PO TABS: 50.0000 mg | ORAL_TABLET | Freq: Two times a day (BID) | ORAL | 0 refills | Status: DC

## 2022-07-04 DIAGNOSIS — R519 Headache, unspecified: Secondary | ICD-10-CM | POA: Diagnosis not present

## 2022-07-04 DIAGNOSIS — Z8673 Personal history of transient ischemic attack (TIA), and cerebral infarction without residual deficits: Secondary | ICD-10-CM | POA: Diagnosis not present

## 2022-07-04 DIAGNOSIS — R42 Dizziness and giddiness: Secondary | ICD-10-CM | POA: Diagnosis not present

## 2022-07-08 ENCOUNTER — Telehealth: Payer: Self-pay

## 2022-07-08 NOTE — Telephone Encounter (Signed)
[employer] Patient Location manager  for [facility] faxed. Transmission log received.

## 2022-07-10 ENCOUNTER — Other Ambulatory Visit (HOSPITAL_COMMUNITY): Payer: Self-pay

## 2022-07-10 ENCOUNTER — Telehealth: Payer: Self-pay | Admitting: Pharmacist

## 2022-07-10 NOTE — Telephone Encounter (Signed)
Patient assistance for [facility] care) was denied. Reason : "..income exceeds current program eligibility limits."   Message left on patient's phone. Instructed to call back to discuss possible alternative.  Katalyn Matin Rodriguez-Guzman PharmD, BCPS 07/10/2022 9:41 AM

## 2022-07-14 ENCOUNTER — Ambulatory Visit (INDEPENDENT_AMBULATORY_CARE_PROVIDER_SITE_OTHER): Payer: Medicare HMO | Admitting: Adult Health

## 2022-07-14 ENCOUNTER — Encounter (INDEPENDENT_AMBULATORY_CARE_PROVIDER_SITE_OTHER): Payer: Self-pay | Admitting: Adult Health

## 2022-07-14 VITALS — BP 120/74 | HR 77 | Temp 97.9°F | Ht 68.0 in | Wt 255.0 lb

## 2022-07-14 DIAGNOSIS — E669 Obesity, unspecified: Secondary | ICD-10-CM

## 2022-07-14 DIAGNOSIS — I480 Paroxysmal atrial fibrillation: Secondary | ICD-10-CM | POA: Diagnosis not present

## 2022-07-14 DIAGNOSIS — Z6838 Body mass index (BMI) 38.0-38.9, adult: Secondary | ICD-10-CM

## 2022-07-14 DIAGNOSIS — I5042 Chronic combined systolic (congestive) and diastolic (congestive) heart failure: Secondary | ICD-10-CM

## 2022-07-14 DIAGNOSIS — Z7985 Long-term (current) use of injectable non-insulin antidiabetic drugs: Secondary | ICD-10-CM | POA: Diagnosis not present

## 2022-07-14 DIAGNOSIS — E1169 Type 2 diabetes mellitus with other specified complication: Secondary | ICD-10-CM

## 2022-07-14 NOTE — Progress Notes (Unsigned)
Patient ID: Regina Christensen, female    DOB: [birth year], 67 y.o.   MRN: [id number]  HPI  Regina Christensen is a 67 y/o female with a history of DM, HTN, stroke, back pain, anxiety, depression, GERD, IBS, PAF, sleep apnea and chronic heart failure.   Echo 03/02/22: EF of 40-45% along with mild LVH, moderate LAE, mildy elevated PA pressure of 41.6 mmHg, mild/moderate MR and moderate/ severe TR.   Cardiac CT 05/22/22: IMPRESSION: 1. There is normal pulmonary vein drainage into the left atrium. (3 on the right and 2 on the left).  2. The left atrial appendage is a chicken wing type with two lobes and ostial size 27 x 20 mm and length 28 mm. There is no thrombus in the left atrial appendage.  3. The esophagus runs in the left atrial midline and is not in the proximity to any of the pulmonary veins  Ablation done 05/29/22. Admitted 03/02/22 due to palpitations and SOB and found to be in AF RVR.Cardiology consult obtained. Cardizem gtt started. Cardioversion completed. Hypokalemia corrected. Discharged after 4 days.   She presents today for a HF follow-up visit with a chief complaint of minimal SOB w/ moderate exertion. Chronic in nature although improving. Has associated fatigue (improving), pedal edema and chronic back pain. Denies difficulty sleeping, dizziness, abdominal distention, palpitations, chest pain, wheezing, cough or weight gain.   Says that she has noticed that she can walk farther before she gets fatigued/ SOB.   Missed taking her evening meds last night because she fell asleep very early and didn't realize she missed the meds until this morning.   She also has inadvertently been taking 200mg  metoprolol tartrate BID instead of 150mg  because the bottle she has been using is an older bottle from last Nov and they are 100mg  tablets and she didn't realize it. She has the 75mg  tablets at home in an upstream bag.   Past Medical History:  Diagnosis Date   Anxiety    Arthritis    Back pain     BPPV (benign paroxysmal positional vertigo)    CHF (congestive heart failure) (HCC)    Chronic diastolic HF (heart failure) (HCC)    Complication of anesthesia    Post-operative hypoxia requiring supplemental oxygen   Current use of long term anticoagulation    Apixaban   CVA (cerebral vascular accident) ([place]) 02/28/2020   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; imaging from [facility]   Depression    Edema of both lower extremities    GERD (gastroesophageal reflux disease)    Hypertension    IBS (irritable bowel syndrome)    Lactose intolerance    Morbid obesity with BMI of 45.0-49.9, adult ([place])    Obesity    Osteoarthritis    PAF (paroxysmal atrial fibrillation) ([place])    Pre-diabetes    Sleep apnea    uses CPAP machine sometimes    Type 2 diabetes mellitus without complication ([place])    Vitamin D deficiency    Past Surgical History:  Procedure Laterality Date   ABDOMINAL HYSTERECTOMY     ATRIAL FIBRILLATION ABLATION N/A 05/29/2022   Procedure: ATRIAL FIBRILLATION ABLATION;  Surgeon: Vickie Epley, MD;  Location: [facility];  Service: Cardiovascular;  Laterality: N/A;   BREAST EXCISIONAL BIOPSY Left    CARDIOVERSION N/A 03/05/2022   Procedure: CARDIOVERSION;  Surgeon: Kate Sable, MD;  Location: [facility];  Service: Cardiovascular;  Laterality: N/A;   ENDOMETRIAL ABLATION  x2   ENDOSCOPIC CONCHA BULLOSA RESECTION Bilateral 08/08/2020   Procedure: ENDOSCOPIC CONCHA BULLOSA RESECTION;  Surgeon: Margaretha Sheffield, MD;  Location: [facility];  Service: ENT;  Laterality: Bilateral;   ETHMOIDECTOMY Right 08/08/2020   Procedure: ETHMOIDECTOMY, LEFT PARTIAL ETHMOIDECTOMY;  Surgeon: Margaretha Sheffield, MD;  Location: [facility];  Service: ENT;  Laterality: Right;   IMAGE GUIDED SINUS SURGERY N/A 08/08/2020   Procedure: IMAGE GUIDED SINUS SURGERY, RIGHT FRONTAL SINUSOTOMY;  Surgeon: Margaretha Sheffield, MD;  Location: [facility];  Service: ENT;  Laterality: N/A;   LAPAROSCOPIC SLEEVE  GASTRECTOMY     MAXILLARY ANTROSTOMY Bilateral 08/08/2020   Procedure: MAXILLARY ANTROSTOMY with tissue;  Surgeon: Margaretha Sheffield, MD;  Location: [facility];  Service: ENT;  Laterality: Bilateral;   NASAL TURBINATE REDUCTION  08/27/2020   Procedure: TURBINATE REDUCTION /SUBMUCOSAL DEBRIDEMENT;  Surgeon: Margaretha Sheffield, MD;  Location: [facility];  Service: ENT;;   TOTAL KNEE ARTHROPLASTY Right 06/2019   TOTAL KNEE ARTHROPLASTY Left 01/31/2021   [facility]   Family History  Problem Relation Age of Onset   Diabetes Mother    High blood pressure Mother    Heart disease Father    Cancer Brother    Mental illness Neg Hx    Social History   Tobacco Use   Smoking status: Never   Smokeless tobacco: Never  Substance Use Topics   Alcohol use: No    Alcohol/week: 0.0 standard drinks of alcohol    Comment: rare   Allergies  Allergen Reactions   Hydrocodone-Acetaminophen Other (See Comments)    Extreme headache   Ace Inhibitors Cough   Hctz [Hydrochlorothiazide] Rash    face   Prior to Admission medications   Medication Sig Start Date End Date Taking? Authorizing Provider  acetaminophen (TYLENOL) 650 MG CR tablet Take 650-1,300 mg by mouth every 8 (eight) hours as needed for pain.   Yes [provider]  apixaban (ELIQUIS) 5 MG TABS tablet Take 1 tablet (5 mg total) by mouth 2 (two) times daily. 05/06/22  Yes Agbor-Etang, Aaron Edelman, MD  atorvastatin (LIPITOR) 10 MG tablet TAKE 1 TABLET BY MOUTH EVERY DAY 03/09/22  Yes Sowles, Drue Stager, MD  diltiazem (DILACOR XR) 180 MG 24 hr capsule Take 1 capsule (180 mg total) by mouth daily. 04/04/22  Yes Kate Sable, MD  empagliflozin (JARDIANCE) 10 MG TABS tablet Take 1 tablet (10 mg total) by mouth daily before breakfast. 06/24/22  Yes Darylene Price A, FNP  famotidine (PEPCID) 20 MG tablet Take 1 tablet (20 mg total) by mouth daily as needed for indigestion. 06/25/22  Yes Sowles, Drue Stager, MD  flecainide (TAMBOCOR) 50 MG tablet  Take 1 tablet (50 mg total) by mouth 2 (two) times daily. 06/27/22  Yes Agbor-Etang, Aaron Edelman, MD  furosemide (LASIX) 40 MG tablet TAKE ONE TABLET BY MOUTH DAILY 06/27/22  Yes Sowles, Drue Stager, MD  gabapentin (NEURONTIN) 100 MG capsule Take 100 mg by mouth at bedtime. 12/20/21  Yes [provider]  meclizine (ANTIVERT) 12.5 MG tablet Take 1 tablet (12.5 mg total) by mouth 3 (three) times daily as needed for dizziness. 06/25/22  Yes Sowles, Drue Stager, MD  Metoprolol Tartrate 75 MG TABS Take 2 tablets (150 mg total) by mouth 2 (two) times daily. 06/27/22  Yes Sowles, Drue Stager, MD  Multiple Vitamin (MULTIVITAMIN) tablet Take 1 tablet by mouth daily. BariatricPal Multivitamin   Yes [provider]  olmesartan (BENICAR) 20 MG tablet Take 1 tablet (20 mg total) by mouth daily. 06/25/22  Yes Steele Sizer, MD  pantoprazole (PROTONIX) 40 MG tablet Take 1 tablet (40 mg total) by mouth daily. 06/25/22  Yes Sowles, Drue Stager, MD  potassium chloride (KLOR-CON M) 10 MEQ tablet TAKE TWO TABLETS BY MOUTH DAILY 06/27/22  Yes Sowles, Drue Stager, MD  Semaglutide, 2 MG/DOSE, (OZEMPIC, 2 MG/DOSE,) 8 MG/3ML SOPN Inject 2 mg into the skin once a week. 06/25/22  Yes Sowles, Drue Stager, MD  spironolactone (ALDACTONE) 25 MG tablet Take 0.5 tablets (12.5 mg total) by mouth daily. 04/28/22  Yes Yuna Pizzolato, Otila Kluver A, FNP  triamcinolone (NASACORT) 55 MCG/ACT AERO nasal inhaler Place 2 sprays into the nose daily.   Yes [provider]  venlafaxine XR (EFFEXOR-XR) 75 MG 24 hr capsule Take 1 capsule (75 mg total) by mouth daily with breakfast. 04/25/22  Yes Eappen, Saramma, MD  Vitamin D, Ergocalciferol, (DRISDOL) 1.25 MG (50000 UNIT) CAPS capsule Take 1 capsule (50,000 Units total) by mouth every 7 (seven) days. 03/31/22  Yes Steele Sizer, MD    Review of Systems  Constitutional:  Positive for fatigue (improving). Negative for appetite change.  HENT:  Negative for congestion, postnasal drip and sore throat.   Eyes: Negative.    Respiratory:  Positive for shortness of breath (minimal). Negative for cough, chest tightness and wheezing.   Cardiovascular:  Positive for leg swelling. Negative for chest pain and palpitations.  Gastrointestinal:  Negative for abdominal distention and abdominal pain.  Endocrine: Negative.   Genitourinary: Negative.   Musculoskeletal:  Positive for back pain.  Skin: Negative.   Allergic/Immunologic: Negative.   Neurological:  Negative for dizziness, weakness and light-headedness.  Hematological:  Negative for adenopathy. Does not bruise/bleed easily.  Psychiatric/Behavioral:  Negative for dysphoric mood and sleep disturbance (sleeping on 1-2 pillows). The patient is not nervous/anxious.    Vitals:   07/15/22 0833  BP: (!) 141/79  Pulse: 82  Resp: 14  SpO2: 97%  Weight: 259 lb 8 oz (117.7 kg)   Wt Readings from Last 3 Encounters:  07/15/22 259 lb 8 oz (117.7 kg)  07/14/22 255 lb (115.7 kg)  06/26/22 262 lb 6.4 oz (119 kg)   Lab Results  Component Value Date   CREATININE 0.72 05/14/2022   CREATININE 0.77 05/08/2022   CREATININE 0.72 04/28/2022   Physical Exam Vitals and nursing note reviewed.  Constitutional:      Appearance: Normal appearance.  HENT:     Head: Normocephalic and atraumatic.  Cardiovascular:     Rate and Rhythm: Normal rate and regular rhythm.     Heart sounds: Murmur (II/VI) heard.  Pulmonary:     Effort: Pulmonary effort is normal. No respiratory distress.     Breath sounds: No wheezing or rales.  Abdominal:     General: There is no distension.     Palpations: Abdomen is soft.     Tenderness: There is no abdominal tenderness.  Musculoskeletal:        General: No tenderness.     Cervical back: Normal range of motion and neck supple.     Right lower leg: No tenderness. No edema.     Left lower leg: No tenderness. No edema.  Skin:    General: Skin is warm and dry.  Neurological:     General: No focal deficit present.     Mental Status: She is  alert and oriented to person, place, and time.  Psychiatric:        Mood and Affect: Mood normal.        Behavior: Behavior normal.  Thought Content: Thought content normal.    Assessment & Plan:  1: Chronic heart failure with reduced ejection fraction- - NYHA class II - euvolemic today - weighing daily; reminded to call for an overnight weight gain of > 2 pounds or a weekly weight gain of > 5 pounds - weight down 4 pounds from last visit here 3 weeks ago - echo 03/02/22: EF of 40-45% along with mild LVH, moderate LAE, mildy elevated PA pressure of 41.6 mmHg, mild/moderate MR and moderate/ severe TR.  - Cardiac CT 05/22/22: IMPRESSION: 1. There is normal pulmonary vein drainage into the left atrium. (3 on the right and 2 on the left).  2. The left atrial appendage is a chicken wing type with two lobes and ostial size 27 x 20 mm and length 28 mm. There is no thrombus in the left atrial appendage.  3. The esophagus runs in the left atrial midline and is not in the proximity to any of the pulmonary veins - not adding salt and reading food labels to keep daily sodium intake to < 2000mg   - furosemide 40mg  daily - metoprolol tartrate 200mg  BID as she mistakenly was taking her older 100mg  tablets instead of 75mg  so continued to take 2 tabs BID. She understands to take 150mg  BID - olmesartan 20mg  daily - potassium 37meq daily - spironolactone 12.5mg  daily - jardiance 10mg  daily started at last visit & reports feeling better - BMP today - discussed changing her olmesartan to entresto but she missed all of yesterday evening's meds and has been inadvertently been taking more metoprolol than she should so will defer this for today - saw cardiology (Agbor-Etang) 04/29/22 - BNP 03/02/22 was 577.9  2: HTN- - BP 141/79 initially; rechecked it was 114/80 - saw PCP Ancil Boozer) 06/25/22 - BMP 05/14/22 reviewed and showed sodium 137, potassium 3.9, creatinine 0.72 & GFR >60  3: Atrial  fibrillation- - saw EP Quentin Ore) 05/14/22 - went to [facility] 06/26/22 - has had previous cardioversion - had ablation 05/29/22 - eliquis 5mg  BID - diltiazem XR 180mg  daily - metoprolol tartrate 150mg  BID although has been accidentally taking 200mg  BID; understands her error - flecainide 50mg  BID  4: DM- - A1c 02/10/22 was 6.0% - currently using ozempic - atorvastatin 10mg  daily - [facility]  Return in 3 months, sooner if needed

## 2022-07-14 NOTE — Progress Notes (Signed)
WEIGHT SUMMARY AND BIOMETRICS  Vitals Temp: 97.9 F (36.6 C) BP: 120/74 Pulse Rate: 77 SpO2: 98 %   Anthropometric Measurements Height: 5\' 8"  (1.727 m) Weight: 255 lb (115.7 kg) BMI (Calculated): 38.78 Weight at Last Visit: 259lb Weight Lost Since Last Visit: 4lb Weight Gained Since Last Visit: 0 Starting Weight: 277lb Total Weight Loss (lbs): 22 lb (9.979 kg)   Body Composition  Body Fat %: 47 % Fat Mass (lbs): 120.2 lbs Muscle Mass (lbs): 128.8 lbs Total Body Water (lbs): 92 lbs Visceral Fat Rating : 15   Other Clinical Data Fasting: Yes Labs: No Today's Visit #: 22 Starting Date: 05/10/20    Chief Complaint:   OBESITY Regina Christensen is here to discuss her progress with her obesity treatment plan. She is on the the Category 1 Plan and states she is following her eating plan approximately 70 % of the time.  She states she is exercising walking 30-40 minutes 3-4 times per week.   Interim History:  She will often walk inside her home and track her time. Kitchen to dining room to living room circuit   Reviewed Bioimpedance Results with pt: Muscle Mass + 3.6 lbs Adipose Mass - 7.8 lbs  Subjective:   1. Type 2 diabetes mellitus with other specified complication, without long-term current use of insulin (HCC) Lab Results  Component Value Date   HGBA1C 6.1 (A) 06/25/2022   HGBA1C 6.0 (H) 02/10/2022   HGBA1C 5.8 (H) 08/28/2021   PCP manages weekly Ozempic 2mg - injects on Monday  Denies mass in neck, dysphagia, dyspepsia, persistent hoarseness, abdominal pain, or N/V/C  Cardiology started her on Jardiance 10mg  AQM on 06/24/2022 - tolerating well. Fasting CBG prior to Jardiance 110-130, after starting Jardiance 97-121  2. AF (paroxysmal atrial fibrillation) (Regina Christensen) Established with [facility] on 06/26/2022 ASSESSMENT AND PLAN: 1. Persistent Atrial Fibrillation (ICD10:  I48.19) The patient's CHA2DS2-VASc score is 7, indicating a 11.2% annual risk of stroke.    S/p afib ablation 05/29/22   She is overall doing well since ablation. She is in SR today.   Continue Eliquis 5 mg BID with no missed doses for 3 months post ablation.  Continue diltiazem 180 mg daily Continue flecainide 50 mg BID for now, would like to eventually discontinue this post ablation, especially with decreased EF.  Continue Lopressor 150 mg BID    3. Chronic combined systolic and diastolic CHF (congestive heart failure) ([place]) 06/24/2022 Regina Artis, FNP-C with HF Start taking jardiance as 1 tablet every morning.     Bring your W2 and social security statement back so we can make a copy and send it in with the patient assistance forms     She was DENIED for pt assistance for Jardiance coverage.  Assessment/Plan:   1. Type 2 diabetes mellitus with other specified complication, without long-term current use of insulin ([place]) F/u with Cardiology, re: Regina Christensen therapy  2. AF (paroxysmal atrial fibrillation) ([place]) F/U with [facility] as advised  3. Chronic combined systolic and diastolic CHF (congestive heart failure) ([place]) Keep F/U with Regina Price, FNP-C  4. Obesity, current BMI 38.78  Regina Christensen is currently in the action stage of change. As such, her goal is to continue with weight loss efforts. She has agreed to the Category 1 Plan.   Handout: Other Breakfast Options  Exercise goals: Older adults should follow the adult guidelines. When older adults cannot meet the adult guidelines, they should be as physically active as their abilities and  conditions will allow.  Older adults should do exercises that maintain or improve balance if they are at risk of falling.  Older adults should determine their level of effort for physical activity relative to their level of fitness.   Behavioral modification strategies: increasing lean protein intake, decreasing simple carbohydrates, increasing vegetables, increasing water intake, and meal planning and cooking  strategies.  Regina Christensen has agreed to follow-up with our clinic in 4 weeks. She was informed of the importance of frequent follow-up visits to maximize her success with intensive lifestyle modifications for her multiple health conditions.    Objective:   Blood pressure 120/74, pulse 77, temperature 97.9 F (36.6 C), height 5\' 8"  (1.727 m), weight 255 lb (115.7 kg), SpO2 98 %. Body mass index is 38.77 kg/m.  General: Cooperative, alert, well developed, in no acute distress. HEENT: Conjunctivae and lids unremarkable. Cardiovascular: Regular rhythm.  Lungs: Normal work of breathing. Neurologic: No focal deficits.   Lab Results  Component Value Date   CREATININE 0.72 05/14/2022   BUN 17 05/14/2022   NA 137 05/14/2022   K 3.9 05/14/2022   CL 102 05/14/2022   CO2 23 05/14/2022   Lab Results  Component Value Date   ALT 75 (H) 03/02/2022   AST 48 (H) 03/02/2022   ALKPHOS 83 03/02/2022   BILITOT 1.1 03/02/2022   Lab Results  Component Value Date   HGBA1C 6.1 (A) 06/25/2022   HGBA1C 6.0 (H) 02/10/2022   HGBA1C 5.8 (H) 08/28/2021   HGBA1C 5.6 03/13/2021   HGBA1C 6.0 (H) 08/16/2020   Lab Results  Component Value Date   INSULIN 8.0 08/28/2021   Lab Results  Component Value Date   TSH 1.359 03/02/2022   Lab Results  Component Value Date   CHOL 112 02/10/2022   HDL 66 02/10/2022   LDLCALC 30 02/10/2022   TRIG 79 02/10/2022   CHOLHDL 1.7 02/10/2022   Lab Results  Component Value Date   VD25OH 60 02/10/2022   VD25OH 38.1 08/28/2021   VD25OH 37.3 11/14/2020   Lab Results  Component Value Date   WBC 7.0 05/14/2022   HGB 14.6 05/14/2022   HCT 44.9 05/14/2022   MCV 89.6 05/14/2022   PLT 291 05/14/2022   Lab Results  Component Value Date   IRON 39 11/14/2020   TIBC 329 11/14/2020   FERRITIN 25 11/14/2020    Attestation Statements:   Reviewed by clinician on day of visit: allergies, medications, problem list, medical history, surgical history, family history,  social history, and previous encounter notes.  I have reviewed the above documentation for accuracy and completeness, and I agree with the above. -  Ephriam Turman d. Hakeem Frazzini, NP-C

## 2022-07-15 ENCOUNTER — Encounter: Payer: Self-pay | Admitting: Family

## 2022-07-15 ENCOUNTER — Ambulatory Visit (HOSPITAL_BASED_OUTPATIENT_CLINIC_OR_DEPARTMENT_OTHER): Payer: Medicare HMO | Admitting: Family

## 2022-07-15 ENCOUNTER — Other Ambulatory Visit
Admission: RE | Admit: 2022-07-15 | Discharge: 2022-07-15 | Disposition: A | Discharge status: Home / Self Care | Payer: Medicare HMO | Source: Ambulatory Visit | Attending: Family | Admitting: Family

## 2022-07-15 VITALS — BP 114/80 | HR 82 | Resp 14 | Wt 259.5 lb

## 2022-07-15 DIAGNOSIS — I4819 Other persistent atrial fibrillation: Secondary | ICD-10-CM

## 2022-07-15 DIAGNOSIS — E119 Type 2 diabetes mellitus without complications: Secondary | ICD-10-CM | POA: Diagnosis not present

## 2022-07-15 DIAGNOSIS — I10 Essential (primary) hypertension: Secondary | ICD-10-CM | POA: Diagnosis not present

## 2022-07-15 DIAGNOSIS — I5022 Chronic systolic (congestive) heart failure: Secondary | ICD-10-CM | POA: Diagnosis not present

## 2022-07-15 LAB — BASIC METABOLIC PANEL WITH GFR
Anion gap: 4 — ABNORMAL LOW (ref 5–15)
BUN: 19 mg/dL (ref 8–23)
CO2: 27 mmol/L (ref 22–32)
Calcium: 8.9 mg/dL (ref 8.9–10.3)
Chloride: 107 mmol/L (ref 98–111)
Creatinine, Ser: 0.82 mg/dL (ref 0.44–1.00)
GFR, Estimated: >60 mL/min
Glucose, Bld: 97 mg/dL (ref 70–99)
Potassium: 3.8 mmol/L (ref 3.5–5.1)
Sodium: 138 mmol/L (ref 135–145)

## 2022-07-18 ENCOUNTER — Telehealth: Payer: Self-pay

## 2022-07-18 NOTE — Progress Notes (Signed)
Care Management & Coordination Services Pharmacy Team Pharmacy Assistant   Name: Regina Christensen  MRN: [id number] DOB: 04-Nov-1955  Reason for Encounter: Medication Coordination and Delivery for [facility]  Contacted patient to discuss medications and coordinate delivery from [place]. Spoke with patient on 07/18/2022   Chart review: Recent office visits:  06/25/2022 Steele Sizer, MD (PCP Office Visit) for Follow-up- Started: Meclizine HCl 12.5 mg three times daily prn, Lab order placed, Patient to follow-up in 4 months  Recent consult visits:  07/15/2022 Darylene Price, [place] ([facility]) for CHF- No medication changes noted, Lab orders placed, Patient to follow-up in 3 months  07/14/2022 Mina Marble, NP ([facility]) for Follow-up- No medication changes noted, No orders placed, Patient to follow-up in 4 weeks  07/04/2022 Rufina Falco, [place] (Neurology) for Headache Disorder- No medication changes noted, No orders placed, Patient to follow-up in 1 year  06/26/2022 Malka So, [place] (Cardiology) for Follow-up- No medication changes noted, EKG order placed, BP: 142/84, No follow-up noted  06/24/2022 Darylene Price, [place] ([facility]) for Follow-up- Started: Jardiance 10 mg tablet daily, Stopped: colchicine 0.6 mg, No orders placed, Patient to follow--up in 3 weeks  06/12/2022 Mina Marble, NP ([facility]) for Follow-up- No medication changes noted, No orders placed, Patient to follow-up in 4 weeks  05/26/2022 Darylene Price, [place] ([facility]) for CHF- No medication changes noted, No orders placed, BP: 169/85, Patient to follow-up in 1 month  05/14/2022 Lars Mage, MD (Cardiology) for Follow-up- No medication changes noted, EKG order placed,   05/06/2022 Ronny Bacon, MD (General Surgery) for Initial Visit- No medication changes noted, No orders placed, BP: 155/81, patient to follow-up in 3 months  05/01/2022 Mina Marble, NP ([facility]) for Type II DM- No medication changes noted, No orders placed, BP: 148/74, Patient to follow-up in 6 weeks  04/29/2022 Kate Sable, MD (Cardiology) for 2 week follow-up- No medication changes noted, EKG order placed, BP: 142/88, Patient to follow-up in 4 months  04/28/2022 Darylene Price, [place] ([facility]) for CHF- Started: Spironolactone 12.5 mg daily, Stopped: Xiidra 5%, Lab order placed, BP: 145/73, No follow-up noted  04/25/2022 Ursula Alert, MD (Behavioral Health) for GAD- I did not break the glass to view the note. I am unsure of any medication changes made.  04/10/2022 Kate Sable, MD (Cardiology) for Follow-up- Started: Flecainide Acetate 50 mg twice daily, EKG order placed, Patient to follow-up in 2-3 weeks  04/09/2022 Hollice Espy, MD (Urology) for Initial Visit- No medication changes noted, Lab orders placed, CT Hematuria order placed, No follow-up noted  04/04/2022 Kate Sable, MD (Cardiology) for Paroxysmal A-FIB- No medication changes noted, EKG Order placed, Patient to follow-up in 6 months  Hospital visits:  Medication Reconciliation was completed by comparing discharge summary, patient's EMR and Pharmacy list, and upon discussion with patient.  Admitted to the hospital on 05/29/2022 due to A-FIB Ablation. Discharge date was 05/29/2022. Discharged from [place]?Medications Started at [facility] Discharge:?? -START taking: Colchicine 0.6 MG tablet Take 1 tablet (0.6 mg total) by mouth 2 (two) times daily for 5 days. pantoprazole (Protonix) 40 MG tablet Take 1 tablet (40 mg total) by mouth daily.  Medication Changes at Hospital Discharge: -Changed None ID  Medications Discontinued at Hospital Discharge: -Stopped None ID  Medications that remain the same after Hospital Discharge:??  -All other medications will remain the same.    Medications: Outpatient Encounter Medications as of 07/18/2022  Medication Sig  Note   acetaminophen (TYLENOL) 650 MG CR tablet Take 650-1,300 mg by mouth every 8 (eight) hours as needed for pain.    apixaban (ELIQUIS) 5 MG TABS tablet Take 1 tablet (5 mg total) by mouth 2 (two) times daily.    atorvastatin (LIPITOR) 10 MG tablet TAKE 1 TABLET BY MOUTH EVERY DAY    diltiazem (DILACOR XR) 180 MG 24 hr capsule Take 1 capsule (180 mg total) by mouth daily.    empagliflozin (JARDIANCE) 10 MG TABS tablet Take 1 tablet (10 mg total) by mouth daily before breakfast.    famotidine (PEPCID) 20 MG tablet Take 1 tablet (20 mg total) by mouth daily as needed for indigestion.    flecainide (TAMBOCOR) 50 MG tablet Take 1 tablet (50 mg total) by mouth 2 (two) times daily.    furosemide (LASIX) 40 MG tablet TAKE ONE TABLET BY MOUTH DAILY    gabapentin (NEURONTIN) 100 MG capsule Take 100 mg by mouth at bedtime.    meclizine (ANTIVERT) 12.5 MG tablet Take 1 tablet (12.5 mg total) by mouth 3 (three) times daily as needed for dizziness.    Metoprolol Tartrate 75 MG TABS Take 2 tablets (150 mg total) by mouth 2 (two) times daily. 07/15/2022: Has been taking 2 100 mg tablets twice daily    Multiple Vitamin (MULTIVITAMIN) tablet Take 1 tablet by mouth daily. BariatricPal Multivitamin    olmesartan (BENICAR) 20 MG tablet Take 1 tablet (20 mg total) by mouth daily.    pantoprazole (PROTONIX) 40 MG tablet Take 1 tablet (40 mg total) by mouth daily.    potassium chloride (KLOR-CON M) 10 MEQ tablet TAKE TWO TABLETS BY MOUTH DAILY    Semaglutide, 2 MG/DOSE, (OZEMPIC, 2 MG/DOSE,) 8 MG/3ML SOPN Inject 2 mg into the skin once a week.    spironolactone (ALDACTONE) 25 MG tablet Take 0.5 tablets (12.5 mg total) by mouth daily.    triamcinolone (NASACORT) 55 MCG/ACT AERO nasal inhaler Place 2 sprays into the nose daily.    venlafaxine XR (EFFEXOR-XR) 75 MG 24 hr capsule Take 1 capsule (75 mg total) by mouth daily with breakfast.    Vitamin D, Ergocalciferol, (DRISDOL) 1.25 MG (50000 UNIT) CAPS capsule Take 1  capsule (50,000 Units total) by mouth every 7 (seven) days.    No facility-administered encounter medications on file as of 07/18/2022.   BP Readings from Last 3 Encounters:  07/15/22 114/80  07/14/22 120/74  06/26/22 (!) 142/84    Pulse Readings from Last 3 Encounters:  07/15/22 82  07/14/22 77  06/26/22 83    Lab Results  Component Value Date/Time   HGBA1C 6.1 (A) 06/25/2022 08:22 AM   HGBA1C 6.0 (H) 02/10/2022 12:09 PM   HGBA1C 5.8 (H) 08/28/2021 09:04 AM   Lab Results  Component Value Date   CREATININE 0.82 07/15/2022   BUN 19 07/15/2022   GFRNONAA >60 07/15/2022   GFRAA 97 11/29/2019   NA 138 07/15/2022   K 3.8 07/15/2022   CALCIUM 8.9 07/15/2022   CO2 27 07/15/2022   Cycle dispensing form sent to Clarita Leber, [facility] for review.   Last adherence delivery date: Unsure as patient Med Sync was just resumed with the Team      Patient is due for next adherence delivery on: 07/31/2022 1st Route  This delivery to include: Adherence Packaging  30 Days  Flecainide 50 mg tablet 1 tablet twice daily (Lunch, Bed Time) Atorvastatin 10 mg 1 tablet daily (Lunch) Famotidine 20 mg 1  tablet daily prn Metoprolol 75 mg take 150 mg by mouth 2 tablets twice daily (Lunch, Bedtime) Venlafaxine 37.5 mg take 1 capsule daily with breakfast. Take along with 75 mg daily (Breakfast) Venlafaxine 75 mg 1 capsule daily with breakfast. Take along with 37.5 mg capsule (Breakfast) Vitamin D 50, 000 Unit 1 capsule every 7 days (Lunch) Olmesartan 20 mg 1 tablet daily (Lunch) Furosemide 40 mg 1 tablet daily  (Lunch) Diltiazem 180 mg 1 tablet daily (Lunch) Potassium 10 mEq two tablets daily (Lunch) Eliquis 5 mg tablet 1 tablet twice daily (Lunch, Bed Time) Jardiance 10 mg 1 tablet daily (Before Breakfast)  Patient declined the following medications this month: No medications were declined  No refill request needed.  Confirmed delivery date of 07/31/2022 1st Route, advised patient that pharmacy will  contact them the morning of delivery.  Any concerns about your medications? No  How often do you forget or accidentally miss a dose? Never  Do you use a pillbox? No  Is patient in packaging Yes   Recent blood pressure readings are as follows: 114/80   Lynann Bologna, CPA/CMA Clinical Pharmacist Assistant Phone: [phone]

## 2022-07-20 ENCOUNTER — Other Ambulatory Visit: Payer: Self-pay | Admitting: Family Medicine

## 2022-07-20 DIAGNOSIS — I110 Hypertensive heart disease with heart failure: Secondary | ICD-10-CM

## 2022-07-20 DIAGNOSIS — Z8673 Personal history of transient ischemic attack (TIA), and cerebral infarction without residual deficits: Secondary | ICD-10-CM

## 2022-07-20 DIAGNOSIS — E1159 Type 2 diabetes mellitus with other circulatory complications: Secondary | ICD-10-CM | POA: Diagnosis not present

## 2022-07-20 DIAGNOSIS — I5040 Unspecified combined systolic (congestive) and diastolic (congestive) heart failure: Secondary | ICD-10-CM | POA: Diagnosis not present

## 2022-07-25 ENCOUNTER — Ambulatory Visit (INDEPENDENT_AMBULATORY_CARE_PROVIDER_SITE_OTHER): Payer: Medicare HMO

## 2022-07-25 DIAGNOSIS — I5042 Chronic combined systolic (congestive) and diastolic (congestive) heart failure: Secondary | ICD-10-CM

## 2022-07-25 DIAGNOSIS — E1129 Type 2 diabetes mellitus with other diabetic kidney complication: Secondary | ICD-10-CM

## 2022-07-25 NOTE — Chronic Care Management (AMB) (Unsigned)
  Chronic Care Management   CCM RN Visit Note  07/25/2022 Name: MURIEL BABERS MRN: [id number] DOB: 01/03/1956  Subjective: CHAYE SIDES is a 67 y.o. year old female who is a primary care patient of [name], MD. The patient was referred to the Chronic Care Management team for assistance with care management needs subsequent to provider initiation of CCM services and plan of care.    Today's Visit:  Engaged with patient by telephone for follow up visit.           PLAN:

## 2022-07-29 ENCOUNTER — Telehealth: Payer: Self-pay | Admitting: Family Medicine

## 2022-07-29 NOTE — Telephone Encounter (Signed)
Called patient to schedule Medicare Annual Wellness Visit (AWV). Left message for patient to call back and schedule Medicare Annual Wellness Visit (AWV).  Last date of AWV: 10/19/2022  Please schedule an appointment at any time with [facility].  If any questions, please contact me at [phone].  Thank you ,  Randon Goldsmith Care Guide [facility] AWV TEAM Direct Dial: [phone]

## 2022-08-06 ENCOUNTER — Ambulatory Visit: Payer: Medicare HMO

## 2022-08-06 DIAGNOSIS — I5020 Unspecified systolic (congestive) heart failure: Secondary | ICD-10-CM

## 2022-08-06 DIAGNOSIS — E119 Type 2 diabetes mellitus without complications: Secondary | ICD-10-CM

## 2022-08-06 NOTE — Progress Notes (Signed)
Care Management & Coordination Services Pharmacy Note  08/06/2022 Name:  Regina Christensen MRN:  [id number] DOB:  01-08-1956  Summary: Patient presents for follow-up consult.   Recommendations/Changes made from today's visit: Continue current medications  Follow up plan: CPP follow-up 6 months   Subjective: Regina Christensen is an 67 y.o. year old female who is a primary patient of Regina Cory, MD.  The care coordination team was consulted for assistance with disease management and care coordination needs.    Engaged with patient by telephone for follow up visit.  Recent office visits: 06/25/22: Patient presented to Dr. Carlynn Christensen for follow-up. A1c 6.1%.   Recent consult visits: 07/15/22: Patient presented to Regina Kindred, FNP (Cardiology)  07/14/22: Patient presented to Regina Hamburger, NP [facility])  07/04/22: Patient presented to Regina Calamity, PA (Neurology)   Hospital visits: Admitted to the hospital on 05/29/2022 due to A-FIB Ablation. Discharge date was 05/29/2022. Discharged from [facility].    Objective:  Lab Results  Component Value Date   CREATININE 0.82 07/15/2022   BUN 19 07/15/2022   EGFR 97 03/10/2022   GFRNONAA >60 07/15/2022   GFRAA 97 11/29/2019   NA 138 07/15/2022   K 3.8 07/15/2022   CALCIUM 8.9 07/15/2022   CO2 27 07/15/2022   GLUCOSE 97 07/15/2022    Lab Results  Component Value Date/Time   HGBA1C 6.1 (A) 06/25/2022 08:22 AM   HGBA1C 6.0 (H) 02/10/2022 12:09 PM   HGBA1C 5.8 (H) 08/28/2021 09:04 AM   MICROALBUR 15.4 02/10/2022 12:09 PM    Last diabetic Eye exam: No results found for: "HMDIABEYEEXA"  Last diabetic Foot exam: No results found for: "HMDIABFOOTEX"   Lab Results  Component Value Date   CHOL 112 02/10/2022   HDL 66 02/10/2022   LDLCALC 30 02/10/2022   TRIG 79 02/10/2022   CHOLHDL 1.7 02/10/2022       Latest Ref Rng & Units 03/02/2022   10:22 AM 02/10/2022   12:09 PM 08/28/2021    9:04 AM  Hepatic Function   Total Protein 6.5 - 8.1 g/dL 6.5  6.6  6.9   Albumin 3.5 - 5.0 g/dL 3.4   4.0   AST 15 - 41 U/L 48  11  15   ALT 0 - 44 U/L 75  11  12   Alk Phosphatase 38 - 126 U/L 83   100   Total Bilirubin 0.3 - 1.2 mg/dL 1.1  0.4  0.3     Lab Results  Component Value Date/Time   TSH 1.359 03/02/2022 12:49 PM   TSH 1.250 11/14/2020 09:10 AM   TSH 1.358 03/01/2020 03:06 AM   TSH 1.05 11/29/2019 03:12 PM       Latest Ref Rng & Units 05/14/2022   12:29 PM 03/10/2022    8:22 AM 03/05/2022    6:08 AM  CBC  WBC 4.0 - 10.5 K/uL 7.0  7.2  7.5   Hemoglobin 12.0 - 15.0 g/dL 40.9  81.1  91.4   Hematocrit 36.0 - 46.0 % 44.9  41.7  37.8   Platelets 150 - 400 K/uL 291  260  248     Lab Results  Component Value Date/Time   VD25OH 60 02/10/2022 12:09 PM   VD25OH 38.1 08/28/2021 09:04 AM   VITAMINB12 699 02/10/2022 12:09 PM   VITAMINB12 804 11/14/2020 09:10 AM    Clinical ASCVD: Yes  The ASCVD Risk score (Arnett DK, et al., 2019) failed to calculate for the following reasons:  The patient has a prior MI or stroke diagnosis       06/25/2022    8:21 AM 04/25/2022    9:12 AM 04/08/2022    4:15 PM  Depression screen PHQ 2/9  Decreased Interest 0 0 0  Down, Depressed, Hopeless 0 0 0  PHQ - 2 Score 0 0 0  Altered sleeping 0    Tired, decreased energy 2    Change in appetite 0    Feeling bad or failure about yourself  0    Trouble concentrating 0    Moving slowly or fidgety/restless 0    Suicidal thoughts 0    PHQ-9 Score 2       Social History   Tobacco Use  Smoking Status Never  Smokeless Tobacco Never   BP Readings from Last 3 Encounters:  07/15/22 114/80  07/14/22 120/74  06/26/22 (!) 142/84   Pulse Readings from Last 3 Encounters:  07/15/22 82  07/14/22 77  06/26/22 83   Wt Readings from Last 3 Encounters:  07/15/22 259 lb 8 oz (117.7 kg)  07/14/22 255 lb (115.7 kg)  06/26/22 262 lb 6.4 oz (119 kg)   BMI Readings from Last 3 Encounters:  07/15/22 39.46 kg/m  07/14/22  38.77 kg/m  06/26/22 39.90 kg/m    Allergies  Allergen Reactions   Hydrocodone-Acetaminophen Other (See Comments)    Extreme headache   Ace Inhibitors Cough   Hctz [Hydrochlorothiazide] Rash    face    Medications Reviewed Today     Reviewed by Regina Fairly, RN (Registered Nurse) on 07/25/22 at 1616  Med List Status: <None>   Medication Order Taking? Sig Documenting Provider Last Dose Status Informant  acetaminophen (TYLENOL) 650 MG CR tablet [id number] No Take 650-1,300 mg by mouth every 8 (eight) hours as needed for pain. [provider] Taking Active Self  apixaban (ELIQUIS) 5 MG TABS tablet [id number] No Take 1 tablet (5 mg total) by mouth 2 (two) times daily. Debbe Odea, MD Taking Active   atorvastatin (LIPITOR) 10 MG tablet [id number]  TAKE ONE TABLET BY MOUTH ONCE DAILY Regina Cory, MD  Active   diltiazem (DILACOR XR) 180 MG 24 hr capsule [id number] No Take 1 capsule (180 mg total) by mouth daily. Debbe Odea, MD Taking Active Self  empagliflozin (JARDIANCE) 10 MG TABS tablet [id number] No Take 1 tablet (10 mg total) by mouth daily before breakfast. Delma Freeze, FNP Taking Active   famotidine (PEPCID) 20 MG tablet [id number] No Take 1 tablet (20 mg total) by mouth daily as needed for indigestion. Regina Cory, MD Taking Active   flecainide (TAMBOCOR) 50 MG tablet [id number] No Take 1 tablet (50 mg total) by mouth 2 (two) times daily. Debbe Odea, MD Taking Active   furosemide (LASIX) 40 MG tablet [id number] No TAKE ONE TABLET BY MOUTH DAILY Regina Christensen, Regina Hefty, MD Taking Active   gabapentin (NEURONTIN) 100 MG capsule [id number] No Take 100 mg by mouth at bedtime. [provider] Taking Active Self           Med Note Regina Christensen   Wed Jun 25, 2022  8:20 AM)    meclizine (ANTIVERT) 12.5 MG tablet [id number] No Take 1 tablet (12.5 mg total) by mouth 3 (three) times daily as needed for dizziness. Regina Cory, MD Taking Active    Metoprolol Tartrate 75 MG TABS [id number] No Take 2 tablets (150 mg total) by mouth 2 (two) times daily. Regina Cory, MD Taking Active  Med Note Regina Christensen Jul 15, 2022  8:39 AM) Has been taking 2 100 mg tablets twice daily   Multiple Vitamin (MULTIVITAMIN) tablet [id number] No Take 1 tablet by mouth daily. BariatricPal Multivitamin [provider] Taking Active Self  olmesartan (BENICAR) 20 MG tablet [id number] No Take 1 tablet (20 mg total) by mouth daily. Regina Cory, MD Taking Active   pantoprazole (PROTONIX) 40 MG tablet [id number] No Take 1 tablet (40 mg total) by mouth daily. Regina Cory, MD Taking Active   potassium chloride (KLOR-CON M) 10 MEQ tablet [id number] No TAKE TWO TABLETS BY MOUTH DAILY Regina Christensen, Regina Hefty, MD Taking Active   Semaglutide, 2 MG/DOSE, (OZEMPIC, 2 MG/DOSE,) 8 MG/3ML SOPN [id number] No Inject 2 mg into the skin once a week. Regina Cory, MD Taking Active   spironolactone (ALDACTONE) 25 MG tablet [id number] No Take 0.5 tablets (12.5 mg total) by mouth daily. Delma Freeze, FNP Taking Active Self  triamcinolone (NASACORT) 55 MCG/ACT AERO nasal inhaler [id number] No Place 2 sprays into the nose daily. [provider] Taking Active Self  venlafaxine XR (EFFEXOR-XR) 75 MG 24 hr capsule [id number] No Take 1 capsule (75 mg total) by mouth daily with breakfast. Jomarie Longs, MD Taking Active Self  Vitamin D, Ergocalciferol, (DRISDOL) 1.25 MG (50000 UNIT) CAPS capsule [id number] No Take 1 capsule (50,000 Units total) by mouth every 7 (seven) days. Regina Cory, MD Taking Active Self            SDOH:  (Social Determinants of Health) assessments and interventions performed: Yes SDOH Interventions    Flowsheet Row Chronic Care Management from 04/08/2022 in [facility] Chronic Care Management from 01/22/2022 in [facility] Office Visit from 03/30/2019 in [facility] Office Visit from 03/02/2019 in [facility] Office Visit from 06/21/2018 in [facility] Office Visit from 06/17/2018 in [facility]  SDOH Interventions        Food Insecurity Interventions Intervention Not Indicated -- -- -- -- --  Housing Interventions Intervention Not Indicated -- -- -- -- --  Transportation Interventions Intervention Not Indicated Intervention Not Indicated -- -- -- --  Utilities Interventions Intervention Not Indicated -- -- -- -- --  Alcohol Usage Interventions Intervention Not Indicated (Score <7) -- -- -- -- --  Depression Interventions/Treatment  -- -- Medication Medication Currently on Treatment Currently on Treatment  Financial Strain Interventions -- Other (Comment)  [PAP] -- -- -- --  Stress Interventions Intervention Not Indicated -- -- -- -- --  Social Connections Interventions Intervention Not Indicated -- -- -- -- --       Medication Assistance: None required.  Patient affirms current coverage meets needs.  Medication Access: Within the past 30 days, how often has patient missed a dose of medication? None Is a pillbox or other method used to improve adherence? Yes  Factors that may affect medication adherence? no barriers identified Are meds synced by current pharmacy? Yes  Are meds delivered by current pharmacy? Yes  Does patient experience delays in picking up medications due to transportation concerns? No   [facility] Reviewed: Current Rx insurance plan: [employer] Name and location of Current pharmacy:  [facility] - [place], [place] - [address] [place] [address] Phone: [phone] Fax: [phone]  [id number] - [place], [place] - [address]  [place] [address] Phone: [phone] Fax: [phone]  [facility] [address] [place] [address] Phone: [phone] Fax: [phone]  Compliance/Adherence/Medication fill history: Care Gaps: Ophthalmology Exam   Star-Rating Drugs: Atorvastatin 10 mg: last filled 08/05/22 for 30-DS Jardiance 10 mg: last filled 08/05/22 for 30-DS Olmesartan 20 mg: last filled 08/05/22 for 30-DS  Assessment/Plan  Heart Failure (Goal: manage symptoms and prevent exacerbations) -Controlled -Last ejection fraction: 55-60% (Date: 2021) -HF type: Diastolic -NYHA Class: I-II (slight limitation of activity) -Current treatment: Diltiazem XR 180 mg daily  Furosemide 40 mg daily Jardiance 10 mg daily  Metoprolol 150 mg twice daily  Olmesartan 20 mg daily  Spironolactone 12.5 mg daily  -Medications previously tried: Valsartan, Benazperil, clonidine, hydralazine, nebivolol, HCTZ,   -Endorses skipping her potassium sometimes due to pill size.  -Recommended to continue current medication  Atrial Fibrillation (Goal: prevent stroke and major bleeding) -Controlled -History of CVA  -CHADSVASC: 7 -Current treatment: Rate control:  Diltiazem XR 180 mg daily Flecainide 50 mg twice daily  Metoprolol 100 mg twice daily  Anticoagulation: Eliquis 5 mg twice daily  -Medications previously tried: NA -PAP completed by Cardiology -Recommended to continue current medication  Hyperlipidemia: (LDL goal < 55) -Controlled -Current treatment: Atorvastatin 10 mg daily  -Medications previously tried: NA  -Patient is candidate for high intensity statin given CVA, would maintain at this dose for now.  -Recommended to continue current medication  Diabetes (A1c goal <7%) -Controlled -Current medications: Jardiance 10 mg daily  Ozempic 2 mg weekly  -Medications previously tried: NA  -Current home glucose readings -Denies hypoglycemic/hyperglycemic symptoms -START PAP for Ozempic  -Patient potential candidate for SGLT2 inhibitor, defer given patient hesitation with pill burden   -Recommended to continue current medication  Depression/Anxiety (Goal: Maintain symptom remission) -Controlled -Current treatment: Venlafaxine 75 mg daily  -Medications previously tried/failed: NA -Working on minimizing dose of venlafaxine.  -Recommended to continue current medication  Osteopenia (Goal Prevent fractures) -Controlled -Patient is not a candidate for pharmacologic treatment -Current treatment  Vitamin D 50,000 units weekly  -Medications previously tried: NA   -Recommend 1200 mg of calcium daily from dietary and supplemental sources. -Recommended to continue current medication  Follow Up Plan: Telephone follow up appointment with care management team member scheduled for: 02/04/2023 at 3:00 PM  Cheyenne Adas, CPP Clinical Pharmacist Practitioner  [facility] [phone]

## 2022-08-07 ENCOUNTER — Telehealth: Payer: Self-pay | Admitting: Family Medicine

## 2022-08-07 NOTE — Telephone Encounter (Signed)
Contacted [name] to schedule their annual wellness visit. Appointment made for 06/12/2023.  [facility] Care Guide [facility] AWV TEAM Direct Dial: [phone]

## 2022-08-12 ENCOUNTER — Encounter (INDEPENDENT_AMBULATORY_CARE_PROVIDER_SITE_OTHER): Payer: Self-pay | Admitting: Adult Health

## 2022-08-12 ENCOUNTER — Ambulatory Visit (INDEPENDENT_AMBULATORY_CARE_PROVIDER_SITE_OTHER): Payer: Medicare HMO | Admitting: Adult Health

## 2022-08-12 VITALS — BP 125/81 | HR 91 | Temp 97.7°F | Ht 68.0 in | Wt 254.0 lb

## 2022-08-12 DIAGNOSIS — Z7984 Long term (current) use of oral hypoglycemic drugs: Secondary | ICD-10-CM

## 2022-08-12 DIAGNOSIS — I152 Hypertension secondary to endocrine disorders: Secondary | ICD-10-CM | POA: Diagnosis not present

## 2022-08-12 DIAGNOSIS — E1159 Type 2 diabetes mellitus with other circulatory complications: Secondary | ICD-10-CM | POA: Diagnosis not present

## 2022-08-12 DIAGNOSIS — Z6838 Body mass index (BMI) 38.0-38.9, adult: Secondary | ICD-10-CM

## 2022-08-12 DIAGNOSIS — Z7985 Long-term (current) use of injectable non-insulin antidiabetic drugs: Secondary | ICD-10-CM | POA: Diagnosis not present

## 2022-08-12 DIAGNOSIS — E669 Obesity, unspecified: Secondary | ICD-10-CM

## 2022-08-12 DIAGNOSIS — E1169 Type 2 diabetes mellitus with other specified complication: Secondary | ICD-10-CM

## 2022-08-12 NOTE — Progress Notes (Signed)
WEIGHT SUMMARY AND BIOMETRICS  Vitals Temp: 97.7 F (36.5 C) BP: 125/81 Pulse Rate: 91 SpO2: 95 %   Anthropometric Measurements Height:  (1.727 m) Weight: 254 lb (115.2 kg) BMI (Calculated): 38.63 Weight at Last Visit: 255lb Weight Lost Since Last Visit: 1lb Weight Gained Since Last Visit: 0 Starting Weight: 277lb Total Weight Loss (lbs): 23 lb (10.4 kg)   Body Composition  Body Fat %: 47.9 % Fat Mass (lbs): 121.6 lbs Muscle Mass (lbs): 125.8 lbs Total Body Water (lbs): 93.2 lbs Visceral Fat Rating : 15   Other Clinical Data Fasting: yes Labs: no Today's Visit #: 23 Starting Date: 05/10/20    Chief Complaint:   OBESITY Regina Christensen is here to discuss her progress with her obesity treatment plan. She is on the the Category 1 Plan and states she is following her eating plan approximately 75 % of the time.  She states she is exercising walking 20 minutes 2 times per week.   Interim History:  07/15/22 OV with Cardiology- medications reviewed at length- dosing instructions understood. She has been able to re-start walking at [employer] 2 x week: 1/2 mile on treadmill on 0% incline- denies CP/dyspnea with this activity. She is frustrated with weight loss pleauta, especially that she is on max dose Ozempic.  Subjective:   1. Hypertension associated with type 2 diabetes mellitus BP at goal at OV. She denies acute cardiac sx's at present. She has re-started walking 2 x week at [employer]. She is currently on: diltiazem (DILACOR XR) 180 MG 24 hr capsule  spironolactone (ALDACTONE) 25 MG tablet  apixaban (ELIQUIS) 5 MG TABS tablet  olmesartan (BENICAR) 20 MG tablet  furosemide (LASIX) 40 MG tablet  Metoprolol Tartrate 75 MG TABS  flecainide (TAMBOCOR) 50 MG tablet    2. Type 2 diabetes mellitus with other specified complication, without long-term current use of insulin Lab Results  Component Value Date   HGBA1C 6.1 (A) 06/25/2022   HGBA1C 6.0  (H) 02/10/2022   HGBA1C 5.8 (H) 08/28/2021   PCP managed weekly Ozmepic  Denies mass in neck, dysphagia, dyspepsia, persistent hoarseness, abdominal pain, or N/V/C  Cardiology manages daily Jardiance   Assessment/Plan:   1. Hypertension associated with type 2 diabetes mellitus Continue walking and current medication regime. Limit Na+ in diet  2. Type 2 diabetes mellitus with other specified complication, without long-term current use of insulin Continue Ozempic and Jardiance. Increase walking as tolerated  3. Obesity, current BMI 38.63 Walking Program at [employer]: April: Walk 1/2 mile at 0% incline May: Walk 1 mile at 0% incline  Regina Christensen is currently in the action stage of change. As such, her goal is to continue with weight loss efforts. She has agreed to the Category 1 Plan.   Exercise goals: Older adults should follow the adult guidelines. When older adults cannot meet the adult guidelines, they should be as physically active as their abilities and conditions will allow.  Older adults should do exercises that maintain or improve balance if they are at risk of falling.  Older adults with chronic conditions should understand whether and how their conditions affect their ability to do regular physical activity safely.  Behavioral modification strategies: increasing lean protein intake, decreasing simple carbohydrates, increasing vegetables, increasing water intake, decreasing sodium intake, increasing high fiber foods, decreasing eating out, no skipping meals, meal planning and cooking strategies, keeping healthy foods in the home, better snacking choices, and planning for success.  Regina Christensen has agreed to  follow-up with our clinic in 4 weeks. She was informed of the importance of frequent follow-up visits to maximize her success with intensive lifestyle modifications for her multiple health conditions.   Objective:   Blood pressure 125/81, pulse 91, temperature 97.7 F  (36.5 C), height  (1.727 m), weight 254 lb (115.2 kg), SpO2 95 %. Body mass index is 38.62 kg/m.  General: Cooperative, alert, well developed, in no acute distress. HEENT: Conjunctivae and lids unremarkable. Cardiovascular: Regular rhythm.  Lungs: Normal work of breathing. Neurologic: No focal deficits.   Lab Results  Component Value Date   CREATININE 0.82 07/15/2022   BUN 19 07/15/2022   NA 138 07/15/2022   K 3.8 07/15/2022   CL 107 07/15/2022   CO2 27 07/15/2022   Lab Results  Component Value Date   ALT 75 (H) 03/02/2022   AST 48 (H) 03/02/2022   ALKPHOS 83 03/02/2022   BILITOT 1.1 03/02/2022   Lab Results  Component Value Date   HGBA1C 6.1 (A) 06/25/2022   HGBA1C 6.0 (H) 02/10/2022   HGBA1C 5.8 (H) 08/28/2021   HGBA1C 5.6 03/13/2021   HGBA1C 6.0 (H) 08/16/2020   Lab Results  Component Value Date   INSULIN 8.0 08/28/2021   Lab Results  Component Value Date   TSH 1.359 03/02/2022   Lab Results  Component Value Date   CHOL 112 02/10/2022   HDL 66 02/10/2022   LDLCALC 30 02/10/2022   TRIG 79 02/10/2022   CHOLHDL 1.7 02/10/2022   Lab Results  Component Value Date   VD25OH 60 02/10/2022   VD25OH 38.1 08/28/2021   VD25OH 37.3 11/14/2020   Lab Results  Component Value Date   WBC 7.0 05/14/2022   HGB 14.6 05/14/2022   HCT 44.9 05/14/2022   MCV 89.6 05/14/2022   PLT 291 05/14/2022   Lab Results  Component Value Date   IRON 39 11/14/2020   TIBC 329 11/14/2020   FERRITIN 25 11/14/2020   Attestation Statements:   Reviewed by clinician on day of visit: allergies, medications, problem list, medical history, surgical history, family history, social history, and previous encounter notes.  Time spent on visit including pre-visit chart review and post-visit care and charting was 28 minutes.   I have reviewed the above documentation for accuracy and completeness, and I agree with the above. -  Tineka Uriegas d. Kaceton Vieau, NP-C

## 2022-08-15 ENCOUNTER — Telehealth: Payer: Self-pay

## 2022-08-18 NOTE — Progress Notes (Signed)
Care Management & Coordination Services Pharmacy Team Pharmacy Assistant   Name: KENZLEIGH SEDAM  MRN: [id number] DOB: [birth year]  Reason for Encounter: Medication Coordination and Delivery for [facility]  Contacted patient to discuss medications and coordinate delivery from [facility]. Spoke with patient on 08/18/2022   Chart review: Recent office visits:  None ID  Recent consult visits:  08/12/2022 William Hamburger, NP ([facility]) for Type II DM- No medication changes noted, No orders placed, Patient to follow-up in 4 weeks  Hospital visits:  None in previous 6 months  Medications: Outpatient Encounter Medications as of 08/15/2022  Medication Sig Note   acetaminophen (TYLENOL) 650 MG CR tablet Take 650-1,300 mg by mouth every 8 (eight) hours as needed for pain.    apixaban (ELIQUIS) 5 MG TABS tablet Take 1 tablet (5 mg total) by mouth 2 (two) times daily.    atorvastatin (LIPITOR) 10 MG tablet TAKE ONE TABLET BY MOUTH ONCE DAILY    diltiazem (DILACOR XR) 180 MG 24 hr capsule Take 1 capsule (180 mg total) by mouth daily.    empagliflozin (JARDIANCE) 10 MG TABS tablet Take 1 tablet (10 mg total) by mouth daily before breakfast.    famotidine (PEPCID) 20 MG tablet Take 1 tablet (20 mg total) by mouth daily as needed for indigestion.    flecainide (TAMBOCOR) 50 MG tablet Take 1 tablet (50 mg total) by mouth 2 (two) times daily.    furosemide (LASIX) 40 MG tablet TAKE ONE TABLET BY MOUTH DAILY    gabapentin (NEURONTIN) 100 MG capsule Take 100 mg by mouth at bedtime.    meclizine (ANTIVERT) 12.5 MG tablet Take 1 tablet (12.5 mg total) by mouth 3 (three) times daily as needed for dizziness.    Metoprolol Tartrate 75 MG TABS Take 2 tablets (150 mg total) by mouth 2 (two) times daily. 07/15/2022: Has been taking 2 100 mg tablets twice daily    Multiple Vitamin (MULTIVITAMIN) tablet Take 1 tablet by mouth daily. BariatricPal Multivitamin    olmesartan (BENICAR) 20  MG tablet Take 1 tablet (20 mg total) by mouth daily.    pantoprazole (PROTONIX) 40 MG tablet Take 1 tablet (40 mg total) by mouth daily.    potassium chloride (KLOR-CON M) 10 MEQ tablet TAKE TWO TABLETS BY MOUTH DAILY    Semaglutide, 2 MG/DOSE, (OZEMPIC, 2 MG/DOSE,) 8 MG/3ML SOPN Inject 2 mg into the skin once a week.    spironolactone (ALDACTONE) 25 MG tablet Take 0.5 tablets (12.5 mg total) by mouth daily.    triamcinolone (NASACORT) 55 MCG/ACT AERO nasal inhaler Place 2 sprays into the nose daily.    venlafaxine XR (EFFEXOR-XR) 75 MG 24 hr capsule Take 1 capsule (75 mg total) by mouth daily with breakfast.    Vitamin D, Ergocalciferol, (DRISDOL) 1.25 MG (50000 UNIT) CAPS capsule Take 1 capsule (50,000 Units total) by mouth every 7 (seven) days.    No facility-administered encounter medications on file as of 08/15/2022.   BP Readings from Last 3 Encounters:  08/12/22 125/81  07/15/22 114/80  07/14/22 120/74    Pulse Readings from Last 3 Encounters:  08/12/22 91  07/15/22 82  07/14/22 77    Lab Results  Component Value Date/Time   HGBA1C 6.1 (A) 06/25/2022 08:22 AM   HGBA1C 6.0 (H) 02/10/2022 12:09 PM   HGBA1C 5.8 (H) 08/28/2021 09:04 AM   Lab Results  Component Value Date   CREATININE 0.82 07/15/2022   BUN 19 07/15/2022   GFRNONAA >60  07/15/2022   GFRAA 97 11/29/2019   NA 138 07/15/2022   K 3.8 07/15/2022   CALCIUM 8.9 07/15/2022   CO2 27 07/15/2022   Cycle dispensing form sent to Angelena Sole, CPP for review.   Last adherence delivery date: Patient was unenrolled for a few months due to not being eligible. This is my 1st med sync for the patient since back being eligible      Patient is due for next adherence delivery on: 08/29/2022  This delivery to include: Vials  30 Days  Flecainide 50 mg 1 tablet twice daily (Lunch, Bedtime) Atorvastatin 10 mg 1 tablet daily (Lunch) Metoprolol 75 mg take 150 mg by mouth twice daily (Lunch, Bedtime) Venlafaxine 75 mg 1 capsule  daily with breakfast. (Breakfast) Vitamin D 50, 000 Unit 1 capsule every 7 days (Lunch) Olmesartan 20 mg 1 tablet daily (Lunch) Furosemide 40 mg 1 tablet daily  (Lunch) Diltiazem 180 mg capsule 1 tablet daily (Lunch) Potassium 10 mEq two tablets daily (Lunch) Eliquis 5 mg 1 tablet twice daily (Lunch, Bedtime) Jardiance 10 mg 1 tablet daily (Before Breakfast) Pantoprazole 40 mg 1 tablet daily (Breakfast)  Patient declined the following medications this month: Famotidine 20 mg prn medication- Patient is unsure is she should still be taking this medication since she was started on Pantoprazole 40 mg daily. CPP sent a message requesting clarification.    No refill request needed.  Confirmed delivery date of 08/29/2022 2nd Route, advised patient that pharmacy will contact them the morning of delivery.  Any concerns about your medications? No  How often do you forget or accidentally miss a dose? Never  Do you use a pillbox? Yes  Is patient in packaging No  If yes  What is the date on your next pill pack?  Any concerns or issues with your packaging?   Recent blood pressure readings are as follows: 125/81 08/12/2022  Patient has a CPP follow-up on 01/25/2023 @ 1500.  Adelene Idler, CPA/CMA Clinical Pharmacist Assistant Phone: [phone]

## 2022-08-19 DIAGNOSIS — I5042 Chronic combined systolic (congestive) and diastolic (congestive) heart failure: Secondary | ICD-10-CM

## 2022-08-19 DIAGNOSIS — E1129 Type 2 diabetes mellitus with other diabetic kidney complication: Secondary | ICD-10-CM

## 2022-08-19 DIAGNOSIS — R809 Proteinuria, unspecified: Secondary | ICD-10-CM

## 2022-08-28 ENCOUNTER — Ambulatory Visit: Payer: Medicare HMO | Attending: Cardiology | Admitting: Cardiology

## 2022-08-28 ENCOUNTER — Encounter: Payer: Self-pay | Admitting: Cardiology

## 2022-08-28 VITALS — BP 126/82 | HR 86 | Resp 98 | Ht 68.0 in | Wt 252.6 lb

## 2022-08-28 DIAGNOSIS — I10 Essential (primary) hypertension: Secondary | ICD-10-CM | POA: Diagnosis not present

## 2022-08-28 DIAGNOSIS — I480 Paroxysmal atrial fibrillation: Secondary | ICD-10-CM

## 2022-08-28 DIAGNOSIS — I5020 Unspecified systolic (congestive) heart failure: Secondary | ICD-10-CM | POA: Diagnosis not present

## 2022-08-28 DIAGNOSIS — Z6838 Body mass index (BMI) 38.0-38.9, adult: Secondary | ICD-10-CM

## 2022-08-28 NOTE — Patient Instructions (Addendum)
Medication Instructions:  Your physician has recommended you make the following change in your medication:   STOP Flecainide   *If you need a refill on your cardiac medications before your next appointment, please call your pharmacy*   Lab Work: None  If you have labs (blood work) drawn today and your tests are completely normal, you will receive your results only by: MyChart Message (if you have MyChart) OR A paper copy in the mail If you have any lab test that is abnormal or we need to change your treatment, we will call you to review the results.   Testing/Procedures: Your physician has requested that you have an echocardiogram in 2 months. Echocardiography is a painless test that uses sound waves to create images of your heart. It provides your doctor with information about the size and shape of your heart and how well your heart's chambers and valves are working. This procedure takes approximately one hour. There are no restrictions for this procedure. Please do NOT wear cologne, perfume, aftershave, or lotions (deodorant is allowed). Please arrive 15 minutes prior to your appointment time.    Follow-Up: At [facility], you and your health needs are our priority.  As part of our continuing mission to provide you with exceptional heart care, we have created designated Provider Care Teams.  These Care Teams include your primary Cardiologist (physician) and Advanced Practice Providers (APPs -  Physician Assistants and Nurse Practitioners) who all work together to provide you with the care you need, when you need it.    Your next appointment:   3 month(s)  Provider:   Debbe Odea, MD

## 2022-08-28 NOTE — Progress Notes (Signed)
Cardiology Office Note:    Date:  08/28/2022   ID:  Regina Christensen, DOB July 25, 1955, MRN [id number]  PCP:  [name], MD  Cardiologist:  Debbe Odea, MD  Electrophysiologist:  Lanier Prude, MD   Referring MD: [name], MD   Chief Complaint  Patient presents with   Follow-up    Unable to sleep on left side due to feeling "fullness" in left side chest area.  Occasional quick left side chest pain.  Would like to discuss duration taking Flecainide.      History of Present Illness:    Regina Christensen is a 67 y.o. female with a hx of hypertension, paroxysmal A. fib s/p ablation 05/2022, CVA 02/2020 (2/2 not taking eliquis), obesity, sleep apnea who presents for follow-up.  Previously seen for symptomatic paroxysmal atrial fibrillation, was started on flecainide with improved symptoms.  She underwent ablation 05/2022.  No further symptoms of palpitations.  Last echocardiogram 02/2022 showed reduced EF 40 to 45%.  Has left-sided chest fullness when she lays on her left side, relief when she lays on her right side.  Compliant with medications as prescribed, no bleeding issues with Eliquis.  Takes Lopressor, Benicar, Cardizem, Aldactone.   Prior notes Echo 02/2022 EF 40 to 45% Echocardiogram 02/2020 EF 55 to 60%, mildly dilated left atrium  Past Medical History:  Diagnosis Date   Anxiety    Arthritis    Back pain    BPPV (benign paroxysmal positional vertigo)    CHF (congestive heart failure) (HCC)    Chronic diastolic HF (heart failure) (HCC)    Complication of anesthesia    Post-operative hypoxia requiring supplemental oxygen   Current use of long term anticoagulation    Apixaban   CVA (cerebral vascular accident) (HCC) 02/28/2020   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; imaging from [facility]   Depression    Edema of both lower extremities    GERD (gastroesophageal reflux disease)    Hypertension    IBS (irritable bowel syndrome)    Lactose  intolerance    Morbid obesity with BMI of 45.0-49.9, adult (HCC)    Obesity    Osteoarthritis    PAF (paroxysmal atrial fibrillation) (HCC)    Pre-diabetes    Sleep apnea    uses CPAP machine sometimes    Type 2 diabetes mellitus without complication (HCC)    Vitamin D deficiency     Past Surgical History:  Procedure Laterality Date   ABDOMINAL HYSTERECTOMY     ATRIAL FIBRILLATION ABLATION N/A 05/29/2022   Procedure: ATRIAL FIBRILLATION ABLATION;  Surgeon: Lanier Prude, MD;  Location: [facility];  Service: Cardiovascular;  Laterality: N/A;   BREAST EXCISIONAL BIOPSY Left    CARDIOVERSION N/A 03/05/2022   Procedure: CARDIOVERSION;  Surgeon: Debbe Odea, MD;  Location: [facility];  Service: Cardiovascular;  Laterality: N/A;   ENDOMETRIAL ABLATION     x2   ENDOSCOPIC CONCHA BULLOSA RESECTION Bilateral 08/08/2020   Procedure: ENDOSCOPIC CONCHA BULLOSA RESECTION;  Surgeon: Vernie Murders, MD;  Location: [facility];  Service: ENT;  Laterality: Bilateral;   ETHMOIDECTOMY Right 08/08/2020   Procedure: ETHMOIDECTOMY, LEFT PARTIAL ETHMOIDECTOMY;  Surgeon: Vernie Murders, MD;  Location: [facility];  Service: ENT;  Laterality: Right;   IMAGE GUIDED SINUS SURGERY N/A 08/08/2020   Procedure: IMAGE GUIDED SINUS SURGERY, RIGHT FRONTAL SINUSOTOMY;  Surgeon: Vernie Murders, MD;  Location: [facility];  Service: ENT;  Laterality: N/A;   LAPAROSCOPIC SLEEVE GASTRECTOMY     MAXILLARY  ANTROSTOMY Bilateral 08/08/2020   Procedure: MAXILLARY ANTROSTOMY with tissue;  Surgeon: Vernie Murders, MD;  Location: [facility];  Service: ENT;  Laterality: Bilateral;   NASAL TURBINATE REDUCTION  08/27/2020   Procedure: TURBINATE REDUCTION /SUBMUCOSAL DEBRIDEMENT;  Surgeon: Vernie Murders, MD;  Location: [facility];  Service: ENT;;   TOTAL KNEE ARTHROPLASTY Right 06/2019   TOTAL KNEE ARTHROPLASTY Left 01/31/2021   [facility]    Current Medications: Current Meds  Medication Sig    acetaminophen (TYLENOL) 650 MG CR tablet Take 650-1,300 mg by mouth every 8 (eight) hours as needed for pain.   apixaban (ELIQUIS) 5 MG TABS tablet Take 1 tablet (5 mg total) by mouth 2 (two) times daily.   atorvastatin (LIPITOR) 10 MG tablet TAKE ONE TABLET BY MOUTH ONCE DAILY   diltiazem (DILACOR XR) 180 MG 24 hr capsule Take 1 capsule (180 mg total) by mouth daily.   empagliflozin (JARDIANCE) 10 MG TABS tablet Take 1 tablet (10 mg total) by mouth daily before breakfast.   famotidine (PEPCID) 20 MG tablet Take 1 tablet (20 mg total) by mouth daily as needed for indigestion.   furosemide (LASIX) 40 MG tablet TAKE ONE TABLET BY MOUTH DAILY   gabapentin (NEURONTIN) 100 MG capsule Take 100 mg by mouth at bedtime.   meclizine (ANTIVERT) 12.5 MG tablet Take 1 tablet (12.5 mg total) by mouth 3 (three) times daily as needed for dizziness.   Metoprolol Tartrate 75 MG TABS Take 2 tablets (150 mg total) by mouth 2 (two) times daily.   Multiple Vitamin (MULTIVITAMIN) tablet Take 1 tablet by mouth daily. BariatricPal Multivitamin   olmesartan (BENICAR) 20 MG tablet Take 1 tablet (20 mg total) by mouth daily.   pantoprazole (PROTONIX) 40 MG tablet Take 1 tablet (40 mg total) by mouth daily.   potassium chloride (KLOR-CON M) 10 MEQ tablet TAKE TWO TABLETS BY MOUTH DAILY   Semaglutide, 2 MG/DOSE, (OZEMPIC, 2 MG/DOSE,) 8 MG/3ML SOPN Inject 2 mg into the skin once a week.   spironolactone (ALDACTONE) 25 MG tablet Take 0.5 tablets (12.5 mg total) by mouth daily.   triamcinolone (NASACORT) 55 MCG/ACT AERO nasal inhaler Place 2 sprays into the nose daily.   venlafaxine XR (EFFEXOR-XR) 75 MG 24 hr capsule Take 1 capsule (75 mg total) by mouth daily with breakfast.   Vitamin D, Ergocalciferol, (DRISDOL) 1.25 MG (50000 UNIT) CAPS capsule Take 1 capsule (50,000 Units total) by mouth every 7 (seven) days.   [DISCONTINUED] flecainide (TAMBOCOR) 50 MG tablet Take 1 tablet (50 mg total) by mouth 2 (two) times daily.      Allergies:   Hydrocodone-acetaminophen, Ace inhibitors, and Hctz [hydrochlorothiazide]   Social History   Socioeconomic History   Marital status: Divorced    Spouse name: Not on file   Number of children: 2   Years of education: Not on file   Highest education level: Associate degree: occupational, Scientist, product/process development, or vocational program  Occupational History   Occupation: Retired/ work PT    Employer: [employer]  Tobacco Use   Smoking status: Never   Smokeless tobacco: Never  Vaping Use   Vaping Use: Never used  Substance and Sexual Activity   Alcohol use: No    Alcohol/week: 0.0 standard drinks of alcohol    Comment: rare   Drug use: No   Sexual activity: Not on file  Other Topics Concern   Not on file  Social History Narrative   Not on file   Social Determinants of Health  Financial Resource Strain: High Risk (01/22/2022)   Overall Financial Resource Strain (CARDIA)    Difficulty of Paying Living Expenses: Very hard  Food Insecurity: No Food Insecurity (04/08/2022)   Hunger Vital Sign    Worried About Running Out of Food in the Last Year: Never true    Ran Out of Food in the Last Year: Never true  Transportation Needs: No Transportation Needs (04/08/2022)   PRAPARE - Administrator, Civil Service (Medical): No    Lack of Transportation (Non-Medical): No  Physical Activity: Insufficiently Active (10/18/2021)   Exercise Vital Sign    Days of Exercise per Week: 3 days    Minutes of Exercise per Session: 10 min  Stress: No Stress Concern Present (04/08/2022)   [employer] of Occupational Health - Occupational Stress Questionnaire    Feeling of Stress : Only a little  Social Connections: Moderately Integrated (04/08/2022)   Social Connection and Isolation Panel [NHANES]    Frequency of Communication with Friends and Family: More than three times a week    Frequency of Social Gatherings with Friends and Family: More than three times a week    Attends Religious  Services: More than 4 times per year    Active Member of [employer] or Organizations: Yes    Attends Banker Meetings: Never    Marital Status: Divorced     Family History: The patient's family history includes Cancer in her brother; Diabetes in her mother; Heart disease in her father; High blood pressure in her mother. There is no history of Mental illness.  Her sister has atrial fibrillation, multiple nephews have atrial fibrillation.  ROS:   Please see the history of present illness.     All other systems reviewed and are negative.  EKGs/Labs/Other Studies Reviewed:    EKG:  EKG is  ordered today.  The ekg ordered today demonstrates normal sinus rhythm, heart rate 86  Recent Labs: 03/02/2022: ALT 75; B Natriuretic Peptide 577.9; Magnesium 2.0; TSH 1.359 05/14/2022: Hemoglobin 14.6; Platelets 291 07/15/2022: BUN 19; Creatinine, Ser 0.82; Potassium 3.8; Sodium 138  Recent Lipid Panel    Component Value Date/Time   CHOL 112 02/10/2022 1209   CHOL 151 12/05/2014 1117   TRIG 79 02/10/2022 1209   HDL 66 02/10/2022 1209   HDL 74 12/05/2014 1117   CHOLHDL 1.7 02/10/2022 1209   VLDL 14 03/01/2020 0306   LDLCALC 30 02/10/2022 1209    Physical Exam:    VS:  BP 126/82 (BP Location: Left Arm, Patient Position: Sitting, Cuff Size: Large)   Pulse 86   Resp (!) 98   Ht 5\' 8"  (1.727 m)   Wt 252 lb 9.6 oz (114.6 kg)   BMI 38.41 kg/m     Wt Readings from Last 3 Encounters:  08/28/22 252 lb 9.6 oz (114.6 kg)  08/12/22 254 lb (115.2 kg)  07/15/22 259 lb 8 oz (117.7 kg)     GEN:  Well nourished, well developed in no acute distress HEENT: Normal NECK: No JVD; No carotid bruits CARDIAC: Regular rate and rhythm. RESPIRATORY:  Clear to auscultation without rales, wheezing or rhonchi  ABDOMEN: Soft, non-tender, distended MUSCULOSKELETAL:  trace edema; No deformity  SKIN: Warm and dry NEUROLOGIC:  Alert and oriented x 3 PSYCHIATRIC:  Normal affect   ASSESSMENT:    1.  Paroxysmal A-fib (HCC)   2. Primary hypertension   3. HFrEF (heart failure with reduced ejection fraction) (HCC)   4. BMI 38.0-38.9,adult  PLAN:    Paroxysmal atrial fibrillation s/p ablation 05/2022.  Maintaining sinus rhythm, symptoms of palpitations significantly improved.  Stop flecainide, continue Lopressor 150 mg twice daily, diltiazem XR 180 mg daily, Eliquis. Hypertension, BP controlled.  Continue Lopressor, diltiazem, Benicar. Mild to moderately reduced EF, 40 to 45%, possibly tachycardia induced.  Start flecainide, recheck echo in 2 months.  If EF stays down, will plan to stop Lopressor and diltiazem, start Coreg. Obesity, low-calorie diet, weight loss advised.  Continue Ozempic.  Follow-up in 3 months after echocardiogram  Medication Adjustments/Labs and Tests Ordered: Current medicines are reviewed at length with the patient today.  Concerns regarding medicines are outlined above.  Orders Placed This Encounter  Procedures   EKG 12-Lead   ECHOCARDIOGRAM COMPLETE    No orders of the defined types were placed in this encounter.    Patient Instructions  Medication Instructions:  Your physician has recommended you make the following change in your medication:   STOP Flecainide   *If you need a refill on your cardiac medications before your next appointment, please call your pharmacy*   Lab Work: None  If you have labs (blood work) drawn today and your tests are completely normal, you will receive your results only by: MyChart Message (if you have MyChart) OR A paper copy in the mail If you have any lab test that is abnormal or we need to change your treatment, we will call you to review the results.   Testing/Procedures: Your physician has requested that you have an echocardiogram in 2 months. Echocardiography is a painless test that uses sound waves to create images of your heart. It provides your doctor with information about the size and shape of your heart and  how well your heart's chambers and valves are working. This procedure takes approximately one hour. There are no restrictions for this procedure. Please do NOT wear cologne, perfume, aftershave, or lotions (deodorant is allowed). Please arrive 15 minutes prior to your appointment time.    Follow-Up: At [facility], you and your health needs are our priority.  As part of our continuing mission to provide you with exceptional heart care, we have created designated Provider Care Teams.  These Care Teams include your primary Cardiologist (physician) and Advanced Practice Providers (APPs -  Physician Assistants and Nurse Practitioners) who all work together to provide you with the care you need, when you need it.    Your next appointment:   3 month(s)  Provider:   Debbe Odea, MD     Signed, Debbe Odea, MD  08/28/2022 9:53 AM     [facility]

## 2022-09-03 ENCOUNTER — Telehealth: Payer: Self-pay

## 2022-09-03 ENCOUNTER — Ambulatory Visit: Payer: Medicare HMO

## 2022-09-03 NOTE — Telephone Encounter (Signed)
   CCM RN Visit Note   09/03/22 Name: Regina Christensen MRN: [id number]      DOB: 1955-09-07  Subjective: Regina Christensen is a 68 y.o. year old female who is a primary care patient of [name], MD. The patient was referred to the Chronic Care Management team for assistance with care management needs subsequent to provider initiation of CCM services and plan of care.      An unsuccessful outreach attempt was made today for a scheduled CCM visit.   PLAN The care team will make additional outreach attempts.   [facility]/Chronic Care Management [phone]

## 2022-09-09 NOTE — Progress Notes (Unsigned)
  Electrophysiology Office Follow up Visit Note:    Date:  09/10/2022   ID:  Regina Christensen, DOB 06/29/1955, MRN [id number]  PCP:  [name], MD  [facility] Cardiologist:  Debbe Odea, MD  [facility] Electrophysiologist:  Lanier Prude, MD    Interval History:    Regina Christensen is a 67 y.o. female who presents for a follow up visit.   She had an A-fib ablation May 29, 2022.  During that procedure the pulmonary veins were isolated.  She saw Dr. Azucena Cecil Aug 28, 2022.  At that appointment her flecainide was stopped.  She continues on Eliquis, Lopressor and diltiazem.  She is doing well today without sustained recurrence.  She intermittently will feel skipped beats but no atrial fibrillation.  She continues to take Eliquis.  She has an upcoming trip planned to the [place] with her children and grandchildren.     Past medical, surgical, social and family history were reviewed.  ROS:   Please see the history of present illness.    All other systems reviewed and are negative.  EKGs/Labs/Other Studies Reviewed:    The following studies were reviewed today:  EKG:  The ekg ordered today demonstrates sinus rhythm.  Ventricular rate 90 bpm.   Physical Exam:    VS:  BP 120/80 (BP Location: Left Arm, Patient Position: Sitting, Cuff Size: Large)   Pulse 90   Ht 5\' 8"  (1.727 m)   Wt 252 lb 6 oz (114.5 kg)   SpO2 96%   BMI 38.37 kg/m     Wt Readings from Last 3 Encounters:  09/10/22 252 lb 6 oz (114.5 kg)  08/28/22 252 lb 9.6 oz (114.6 kg)  08/12/22 254 lb (115.2 kg)     GEN:  Well nourished, well developed in no acute distress.  Obese CARDIAC: RRR, no murmurs, rubs, gallops RESPIRATORY:  Clear to auscultation without rales, wheezing or rhonchi       ASSESSMENT:    1. Paroxysmal A-fib (HCC)   2. Chronic systolic heart failure (HCC)   3. Primary hypertension    PLAN:    In order of problems listed above:  #Paroxysmal  atrial fibrillation Doing well after her catheter ablation in February.  Maintaining sinus rhythm.  Continue Eliquis. Stop diltiazem. Stop metoprolol tartrate. Start metoprolol succinate 75 mg by mouth twice daily  #Hypertension At goal today.  Recommend checking blood pressures 1-2 times per week at home and recording the values.  Recommend bringing these recordings to the primary care physician.  #Chronic systolic heart failure NYHA class II.  Warm and dry on exam. Last ejection fraction in November 2023 was 40 to 45% with global hypokinesis.  Repeat echo scheduled for July 2024. Consider adding Entresto pending results of follow-up echo.  Follow-up with EP APP in 6 months.   Signed, Steffanie Dunn, MD, [facility], [facility] 09/10/2022 10:32 AM    Electrophysiology [facility]

## 2022-09-10 ENCOUNTER — Encounter: Payer: Self-pay | Admitting: Cardiology

## 2022-09-10 ENCOUNTER — Ambulatory Visit: Payer: Medicare HMO | Attending: Cardiology | Admitting: Cardiology

## 2022-09-10 VITALS — BP 120/80 | HR 90 | Ht 68.0 in | Wt 252.4 lb

## 2022-09-10 DIAGNOSIS — I5022 Chronic systolic (congestive) heart failure: Secondary | ICD-10-CM | POA: Diagnosis not present

## 2022-09-10 DIAGNOSIS — I480 Paroxysmal atrial fibrillation: Secondary | ICD-10-CM

## 2022-09-10 DIAGNOSIS — I10 Essential (primary) hypertension: Secondary | ICD-10-CM

## 2022-09-10 MED ORDER — METOPROLOL SUCCINATE ER 50 MG PO TB24: 75.0000 mg | ORAL_TABLET | Freq: Two times a day (BID) | ORAL | 3 refills | Status: DC

## 2022-09-10 NOTE — Patient Instructions (Signed)
Medication Instructions:  Your physician has recommended you make the following change in your medication:  1) STOP taking diltiazem  2) STOP taking metoprolol tartrate 3) START taking metoprolol succinate 75 mg twice daily   *If you need a refill on your cardiac medications before your next appointment, please call your pharmacy*  Follow-Up: At [facility], you and your health needs are our priority.  As part of our continuing mission to provide you with exceptional heart care, we have created designated Provider Care Teams.  These Care Teams include your primary Cardiologist (physician) and Advanced Practice Providers (APPs -  Physician Assistants and Nurse Practitioners) who all work together to provide you with the care you need, when you need it.  Your next appointment:   6 month(s)  Provider:   Sherie Don, NP

## 2022-09-13 ENCOUNTER — Other Ambulatory Visit: Payer: Self-pay | Admitting: Family Medicine

## 2022-09-13 DIAGNOSIS — E559 Vitamin D deficiency, unspecified: Secondary | ICD-10-CM

## 2022-09-15 ENCOUNTER — Other Ambulatory Visit: Payer: Self-pay | Admitting: Psychiatry

## 2022-09-15 DIAGNOSIS — F411 Generalized anxiety disorder: Secondary | ICD-10-CM

## 2022-09-15 DIAGNOSIS — F3342 Major depressive disorder, recurrent, in full remission: Secondary | ICD-10-CM

## 2022-09-16 ENCOUNTER — Ambulatory Visit (INDEPENDENT_AMBULATORY_CARE_PROVIDER_SITE_OTHER): Payer: Medicare HMO | Admitting: Adult Health

## 2022-09-16 ENCOUNTER — Encounter (INDEPENDENT_AMBULATORY_CARE_PROVIDER_SITE_OTHER): Payer: Self-pay | Admitting: Adult Health

## 2022-09-16 VITALS — BP 116/79 | HR 88 | Temp 98.1°F | Ht 68.0 in | Wt 247.0 lb

## 2022-09-16 DIAGNOSIS — Z7985 Long-term (current) use of injectable non-insulin antidiabetic drugs: Secondary | ICD-10-CM

## 2022-09-16 DIAGNOSIS — E1169 Type 2 diabetes mellitus with other specified complication: Secondary | ICD-10-CM

## 2022-09-16 DIAGNOSIS — E1159 Type 2 diabetes mellitus with other circulatory complications: Secondary | ICD-10-CM

## 2022-09-16 DIAGNOSIS — E669 Obesity, unspecified: Secondary | ICD-10-CM | POA: Diagnosis not present

## 2022-09-16 DIAGNOSIS — I152 Hypertension secondary to endocrine disorders: Secondary | ICD-10-CM

## 2022-09-16 DIAGNOSIS — Z6837 Body mass index (BMI) 37.0-37.9, adult: Secondary | ICD-10-CM

## 2022-09-16 NOTE — Progress Notes (Signed)
WEIGHT SUMMARY AND BIOMETRICS  Vitals Temp: 98.1 F (36.7 C) BP: 116/79 Pulse Rate: 88 SpO2: 99 %   Anthropometric Measurements Height: 5\' 8"  (1.727 m) Weight: 247 lb (112 kg) BMI (Calculated): 37.56 Weight at Last Visit: 254lb Weight Lost Since Last Visit: 7lb Weight Gained Since Last Visit: 0 Starting Weight: 277lb Total Weight Loss (lbs): 30 lb (13.6 kg)   Body Composition  Body Fat %: 46.7 % Fat Mass (lbs): 115.4 lbs Muscle Mass (lbs): 125 lbs Total Body Water (lbs): 91.2 lbs Visceral Fat Rating : 15   Other Clinical Data Fasting: yes Labs: no Today's Visit #: 24 Starting Date: 05/10/20    Chief Complaint:   OBESITY Regina Christensen is here to discuss her progress with her obesity treatment plan. She is on the the Category 1 Plan and states she is following her eating plan approximately 90 % of the time. She states she is exercising- Gym 45 minutes 2-3 times per week.   Interim History:  Regina Christensen reports increase in stress r/t her elderly brother in law (in his 15s) hospitalized and sudden death of her 14 year old nephew. She reports strong support system of family/friends/church members. Due to these events- she has not been going to the gym as regularly as she had hoped.  She was recently seen by Dr. Fredderick Erb- 09/10/22 Med Changes- Medication Instructions:  Your physician has recommended you make the following change in your medication:  1) STOP taking diltiazem  2) STOP taking metoprolol tartrate 3) START taking metoprolol succinate 75 mg twice daily       Subjective:   1. Hypertension associated with type 2 diabetes mellitus (HCC) BP at goal at OV She denies cardiac sx's when she exerts herself, ie: walking and strength traning. She is currently on: spironolactone (ALDACTONE) 25 MG tablet  apixaban (ELIQUIS) 5 MG TABS tablet  empagliflozin (JARDIANCE) 10 MG TABS tablet  olmesartan (BENICAR) 20 MG tablet  Semaglutide, 2 MG/DOSE,  (OZEMPIC, 2 MG/DOSE,) 8 MG/3ML SOPN  furosemide (LASIX) 40 MG tablet  atorvastatin (LIPITOR) 10 MG tablet  metoprolol succinate (TOPROL-XL) 50 MG 24 hr tablet  [facility] / Dr. Lalla Brothers recently made these medication changes- 09/10/2022 Medication Instructions:  Your physician has recommended you make the following change in your medication:  1) STOP taking diltiazem  2) STOP taking metoprolol tartrate 3) START taking metoprolol succinate 75 mg twice daily   She will complete home supply metoprolol tartrate then convert to metoprolol succinate  2. Type 2 diabetes mellitus with other specified complication, without long-term current use of insulin (HCC) She has not been checking home CBG- denies sx's of hypoglycemia Denies mass in neck, dysphagia, dyspepsia, persistent hoarseness, abdominal pain, or N/V/C  PCP manages- Ozempic 2mg  once weekly injection Cards manages- Jardiance 10mg  QD Lab Results  Component Value Date   HGBA1C 6.1 (A) 06/25/2022   HGBA1C 6.0 (H) 02/10/2022   HGBA1C 5.8 (H) 08/28/2021     Assessment/Plan:   1. Hypertension associated with type 2 diabetes mellitus (HCC) Continue current antihypertensive regime per PCP and various specialists Recommend checking BP/HR several times per week at home  2. Type 2 diabetes mellitus with other specified complication, without long-term current use of insulin (HCC) Continue empagliflozin (JARDIANCE) 10 MG TABS tablet  olmesartan (BENICAR) 20 MG tablet  Semaglutide, 2 MG/DOSE, (OZEMPIC, 2 MG/DOSE,) 8 MG/3ML SOPN  atorvastatin (LIPITOR) 10 MG tablet  Resume checking fasting and PP CBG at home- bring readings to next OV  3. Obesity, current BMI 37.56  Regina Christensen is currently in the action stage of change. As such, her goal is to continue with weight loss efforts. She has agreed to the Category 1 Plan.   Exercise goals: Older adults should follow the adult guidelines. When older adults cannot meet the adult guidelines, they  should be as physically active as their abilities and conditions will allow.  Older adults should do exercises that maintain or improve balance if they are at risk of falling.  Older adults with chronic conditions should understand whether and how their conditions affect their ability to do regular physical activity safely.  Behavioral modification strategies: increasing lean protein intake, decreasing simple carbohydrates, increasing vegetables, increasing water intake, no skipping meals, meal planning and cooking strategies, and planning for success.  Regina Christensen has agreed to follow-up with our clinic in 4 weeks. She was informed of the importance of frequent follow-up visits to maximize her success with intensive lifestyle modifications for her multiple health conditions.   Objective:   Blood pressure 116/79, pulse 88, temperature 98.1 F (36.7 C), height 5\' 8"  (1.727 m), weight 247 lb (112 kg), SpO2 99 %. Body mass index is 37.56 kg/m.  General: Cooperative, alert, well developed, in no acute distress. HEENT: Conjunctivae and lids unremarkable. Cardiovascular: Regular rhythm.  Lungs: Normal work of breathing. Neurologic: No focal deficits.   Lab Results  Component Value Date   CREATININE 0.82 07/15/2022   BUN 19 07/15/2022   NA 138 07/15/2022   K 3.8 07/15/2022   CL 107 07/15/2022   CO2 27 07/15/2022   Lab Results  Component Value Date   ALT 75 (H) 03/02/2022   AST 48 (H) 03/02/2022   ALKPHOS 83 03/02/2022   BILITOT 1.1 03/02/2022   Lab Results  Component Value Date   HGBA1C 6.1 (A) 06/25/2022   HGBA1C 6.0 (H) 02/10/2022   HGBA1C 5.8 (H) 08/28/2021   HGBA1C 5.6 03/13/2021   HGBA1C 6.0 (H) 08/16/2020   Lab Results  Component Value Date   INSULIN 8.0 08/28/2021   Lab Results  Component Value Date   TSH 1.359 03/02/2022   Lab Results  Component Value Date   CHOL 112 02/10/2022   HDL 66 02/10/2022   LDLCALC 30 02/10/2022   TRIG 79 02/10/2022   CHOLHDL 1.7  02/10/2022   Lab Results  Component Value Date   VD25OH 60 02/10/2022   VD25OH 38.1 08/28/2021   VD25OH 37.3 11/14/2020   Lab Results  Component Value Date   WBC 7.0 05/14/2022   HGB 14.6 05/14/2022   HCT 44.9 05/14/2022   MCV 89.6 05/14/2022   PLT 291 05/14/2022   Lab Results  Component Value Date   IRON 39 11/14/2020   TIBC 329 11/14/2020   FERRITIN 25 11/14/2020   Attestation Statements:   Reviewed by clinician on day of visit: allergies, medications, problem list, medical history, surgical history, family history, social history, and previous encounter notes.  Time spent on visit including pre-visit chart review and post-visit care and charting was 29 minutes.   I have reviewed the above documentation for accuracy and completeness, and I agree with the above. -  Lynn Sissel d. Janiece Scovill, NP-C

## 2022-09-23 ENCOUNTER — Encounter: Payer: Self-pay | Admitting: Family

## 2022-09-23 ENCOUNTER — Ambulatory Visit: Payer: Medicare HMO | Attending: Family | Admitting: Family

## 2022-09-23 VITALS — BP 144/77 | HR 88 | Wt 249.2 lb

## 2022-09-23 DIAGNOSIS — E119 Type 2 diabetes mellitus without complications: Secondary | ICD-10-CM

## 2022-09-23 DIAGNOSIS — I5020 Unspecified systolic (congestive) heart failure: Secondary | ICD-10-CM | POA: Diagnosis not present

## 2022-09-23 DIAGNOSIS — F419 Anxiety disorder, unspecified: Secondary | ICD-10-CM | POA: Diagnosis not present

## 2022-09-23 DIAGNOSIS — F32A Depression, unspecified: Secondary | ICD-10-CM | POA: Insufficient documentation

## 2022-09-23 DIAGNOSIS — I480 Paroxysmal atrial fibrillation: Secondary | ICD-10-CM | POA: Diagnosis not present

## 2022-09-23 DIAGNOSIS — K589 Irritable bowel syndrome without diarrhea: Secondary | ICD-10-CM | POA: Insufficient documentation

## 2022-09-23 DIAGNOSIS — I110 Hypertensive heart disease with heart failure: Secondary | ICD-10-CM | POA: Diagnosis not present

## 2022-09-23 DIAGNOSIS — Z7901 Long term (current) use of anticoagulants: Secondary | ICD-10-CM | POA: Insufficient documentation

## 2022-09-23 DIAGNOSIS — I10 Essential (primary) hypertension: Secondary | ICD-10-CM | POA: Diagnosis not present

## 2022-09-23 DIAGNOSIS — K219 Gastro-esophageal reflux disease without esophagitis: Secondary | ICD-10-CM | POA: Diagnosis not present

## 2022-09-23 DIAGNOSIS — Z79899 Other long term (current) drug therapy: Secondary | ICD-10-CM | POA: Insufficient documentation

## 2022-09-23 DIAGNOSIS — I5032 Chronic diastolic (congestive) heart failure: Secondary | ICD-10-CM | POA: Insufficient documentation

## 2022-09-23 DIAGNOSIS — Z8673 Personal history of transient ischemic attack (TIA), and cerebral infarction without residual deficits: Secondary | ICD-10-CM | POA: Insufficient documentation

## 2022-09-23 DIAGNOSIS — G4730 Sleep apnea, unspecified: Secondary | ICD-10-CM | POA: Diagnosis not present

## 2022-09-23 DIAGNOSIS — I428 Other cardiomyopathies: Secondary | ICD-10-CM | POA: Insufficient documentation

## 2022-09-23 MED ORDER — SACUBITRIL-VALSARTAN 24-26 MG PO TABS: 1.0000 | ORAL_TABLET | Freq: Two times a day (BID) | ORAL | 3 refills | Status: DC

## 2022-09-23 NOTE — Progress Notes (Signed)
PCP: [name], MD (last seen 03/24) Primary Cardiologist: Debbe Odea, MD (last seen 05/24) EP: Steffanie Dunn, MD (last seen 05/24)  HPI:  Ms Livingood is a 67 y/o female with a history of DM, HTN, stroke, back pain, anxiety, depression, GERD, IBS, PAF, sleep apnea and chronic heart failure.   Echo 03/02/22: EF of 40-45% along with mild LVH, moderate LAE, mildy elevated PA pressure of 41.6 mmHg, mild/moderate MR and moderate/ severe TR. Echo 03/01/20: EF 55-60% along with mild LVH, Grade I DD, normal PA pressure, mild LAE and trivial MR.   Cardiac CT 05/22/22: IMPRESSION: 1. There is normal pulmonary vein drainage into the left atrium. (3 on the right and 2 on the left).  2. The left atrial appendage is a chicken wing type with two lobes and ostial size 27 x 20 mm and length 28 mm. There is no thrombus in the left atrial appendage.  3. The esophagus runs in the left atrial midline and is not in the proximity to any of the pulmonary veins  Ablation done 05/29/22. Admitted 03/02/22 due to palpitations and SOB and found to be in AF RVR.Cardiology consult obtained. Cardizem gtt started. Cardioversion completed. Hypokalemia corrected. Discharged after 4 days.   She presents today for a HF follow-up visit with a chief complaint of minimal fatigue with moderate exertion. Chronic in nature. Has associated intermittent palpitations, minimal pedal edema & chronic difficulty sleeping along with this. Denies chest pain, cough, abdominal distention, dizziness or weight gain.   Going to the gym 3 times/ week. [facility]   ROS: All systems negative except as listed in HPI, PMH and Problem List.  SH:  Social History   Socioeconomic History   Marital status: Divorced    Spouse name: Not on file   Number of children: 2   Years of education: Not on file   Highest education level: Associate degree: occupational, Scientist, product/process development, or vocational  program  Occupational History   Occupation: Retired/ work PT    Employer: [employer]  Tobacco Use   Smoking status: Never   Smokeless tobacco: Never  Vaping Use   Vaping Use: Never used  Substance and Sexual Activity   Alcohol use: No    Alcohol/week: 0.0 standard drinks of alcohol    Comment: rare   Drug use: No   Sexual activity: Not on file  Other Topics Concern   Not on file  Social History Narrative   Not on file   Social Determinants of Health   Financial Resource Strain: High Risk (01/22/2022)   Overall Financial Resource Strain (CARDIA)    Difficulty of Paying Living Expenses: Very hard  Food Insecurity: No Food Insecurity (04/08/2022)   Hunger Vital Sign    Worried About Running Out of Food in the Last Year: Never true    Ran Out of Food in the Last Year: Never true  Transportation Needs: No Transportation Needs (04/08/2022)   PRAPARE - Administrator, Civil Service (Medical): No    Lack of Transportation (Non-Medical): No  Physical Activity: Insufficiently Active (10/18/2021)   Exercise Vital Sign    Days of Exercise per Week: 3 days    Minutes of Exercise per Session: 10 min  Stress: No Stress Concern Present (04/08/2022)   [employer] of Occupational Health - Occupational Stress Questionnaire    Feeling of Stress : Only a little  Social Connections: Moderately Integrated (04/08/2022)   Social Connection and Isolation  Panel [NHANES]    Frequency of Communication with Friends and Family: More than three times a week    Frequency of Social Gatherings with Friends and Family: More than three times a week    Attends Religious Services: More than 4 times per year    Active Member of [employer] or Organizations: Yes    Attends Banker Meetings: Never    Marital Status: Divorced  Catering manager Violence: Not At Risk (03/02/2022)   Humiliation, Afraid, Rape, and Kick questionnaire    Fear of Current or Ex-Partner: No    Emotionally Abused: No     Physically Abused: No    Sexually Abused: No    FH:  Family History  Problem Relation Age of Onset   Diabetes Mother    High blood pressure Mother    Heart disease Father    Cancer Brother    Mental illness Neg Hx     Past Medical History:  Diagnosis Date   Anxiety    Arthritis    Back pain    BPPV (benign paroxysmal positional vertigo)    CHF (congestive heart failure) (HCC)    Chronic diastolic HF (heart failure) (HCC)    Complication of anesthesia    Post-operative hypoxia requiring supplemental oxygen   Current use of long term anticoagulation    Apixaban   CVA (cerebral vascular accident) (HCC) 02/28/2020   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; imaging from [facility]   Depression    Edema of both lower extremities    GERD (gastroesophageal reflux disease)    Hypertension    IBS (irritable bowel syndrome)    Lactose intolerance    Morbid obesity with BMI of 45.0-49.9, adult (HCC)    Obesity    Osteoarthritis    PAF (paroxysmal atrial fibrillation) (HCC)    Pre-diabetes    Sleep apnea    uses CPAP machine sometimes    Type 2 diabetes mellitus without complication (HCC)    Vitamin D deficiency     Current Outpatient Medications  Medication Sig Dispense Refill   acetaminophen (TYLENOL) 650 MG CR tablet Take 650-1,300 mg by mouth every 8 (eight) hours as needed for pain.     apixaban (ELIQUIS) 5 MG TABS tablet Take 1 tablet (5 mg total) by mouth 2 (two) times daily. 180 tablet 3   atorvastatin (LIPITOR) 10 MG tablet TAKE ONE TABLET BY MOUTH ONCE DAILY 90 tablet 1   empagliflozin (JARDIANCE) 10 MG TABS tablet Take 1 tablet (10 mg total) by mouth daily before breakfast. 30 tablet 5   famotidine (PEPCID) 20 MG tablet Take 1 tablet (20 mg total) by mouth daily as needed for indigestion. 90 tablet 1   furosemide (LASIX) 40 MG tablet TAKE ONE TABLET BY MOUTH DAILY 90 tablet 0   gabapentin (NEURONTIN) 100 MG capsule Take 100 mg by mouth at bedtime.     meclizine  (ANTIVERT) 12.5 MG tablet Take 1 tablet (12.5 mg total) by mouth 3 (three) times daily as needed for dizziness. 30 tablet 0   metoprolol succinate (TOPROL-XL) 50 MG 24 hr tablet Take 1.5 tablets (75 mg total) by mouth in the morning and at bedtime. Take with or immediately following a meal. 270 tablet 3   Multiple Vitamin (MULTIVITAMIN) tablet Take 1 tablet by mouth daily. BariatricPal Multivitamin     olmesartan (BENICAR) 20 MG tablet Take 1 tablet (20 mg total) by mouth daily. 90 tablet 1   pantoprazole (PROTONIX) 40 MG tablet  Take 1 tablet (40 mg total) by mouth daily. 90 tablet 1   potassium chloride (KLOR-CON M) 10 MEQ tablet TAKE TWO TABLETS BY MOUTH DAILY 180 tablet 0   Semaglutide, 2 MG/DOSE, (OZEMPIC, 2 MG/DOSE,) 8 MG/3ML SOPN Inject 2 mg into the skin once a week. 9 mL 1   spironolactone (ALDACTONE) 25 MG tablet Take 0.5 tablets (12.5 mg total) by mouth daily. 45 tablet 3   triamcinolone (NASACORT) 55 MCG/ACT AERO nasal inhaler Place 2 sprays into the nose daily.     venlafaxine XR (EFFEXOR-XR) 75 MG 24 hr capsule Take 1 capsule (75 mg total) by mouth daily with breakfast. 90 capsule 1   Vitamin D, Ergocalciferol, (DRISDOL) 1.25 MG (50000 UNIT) CAPS capsule TAKE ONE CAPSULE BY MOUTH EVERY 7 DAYS 12 capsule 0   No current facility-administered medications for this visit.   Vitals:   09/23/22 0824  BP: (!) 144/77  Pulse: 88  SpO2: 98%  Weight: 249 lb 3.2 oz (113 kg)   Wt Readings from Last 3 Encounters:  09/23/22 249 lb 3.2 oz (113 kg)  09/16/22 247 lb (112 kg)  09/10/22 252 lb 6 oz (114.5 kg)   Lab Results  Component Value Date   CREATININE 0.82 07/15/2022   CREATININE 0.72 05/14/2022   CREATININE 0.77 05/08/2022    PHYSICAL EXAM:  General:  Well appearing. No resp difficulty HEENT: normal Neck: supple. JVP flat. No lymphadenopathy or thryomegaly appreciated. Cor: PMI normal. Regular rate & rhythm. No rubs, gallops or murmurs. Lungs: clear Abdomen: soft, nontender,  nondistended. No hepatosplenomegaly. No bruits or masses.  Extremities: no cyanosis, clubbing, rash, edema Neuro: alert & oriented x3, cranial nerves grossly intact. Moves all 4 extremities w/o difficulty. Affect pleasant.   ECG: on 09/10/22 was NSR   ASSESSMENT & PLAN:  1: NICM with reduced ejection fraction- - HF likely due to AF/ HTN - NYHA class II - euvolemic today - weighing daily; reminded to call for an overnight weight gain of > 2 pounds or a weekly weight gain of > 5 pounds - weight down 10 pounds from last visit here 2 months ago - echo 03/02/22: EF of 40-45% along with mild LVH, moderate LAE, mildy elevated PA pressure of 41.6 mmHg, mild/moderate MR and moderate/ severe TR.  - Echo 03/01/20: EF 55-60% along with mild LVH, Grade I DD, normal PA pressure, mild LAE and trivial MR.  - has upcoming echo scheduled 07/24 - Cardiac CT 05/22/22: IMPRESSION: 1. There is normal pulmonary vein drainage into the left atrium. (3 on the right and 2 on the left).  2. The left atrial appendage is a chicken wing type with two lobes and ostial size 27 x 20 mm and length 28 mm. There is no thrombus in the left atrial appendage.  3. The esophagus runs in the left atrial midline and is not in the proximity to any of the pulmonary veins - not adding salt and reading food labels to keep daily sodium intake to < 2000mg   - continue furosemide 40mg  daily - continue metoprolol succinate 75mg  BID - finish olmesartan 20mg  daily (has ~ 2 weeks left) and then begin entresto 24/26mg  BID - continue potassium daily - continue spironolactone 12.5mg  daily - continue jardiance 10mg  daily  - has PCP appt 07/24 so will message her about checking BMET at that visit - saw cardiology (Agbor-Etang) 05/24 - BNP 03/02/22 was 577.9  2: HTN- - BP 144/77; changing olmesartan to entresto per above - saw PCP (  Sowles) 03/24 - BMP 07/15/22 reviewed and showed sodium 138, potassium 3.8, creatinine 0.82 & GFR  >60  3: Paroxysmal Atrial fibrillation- - saw EP Lalla Brothers) 05/24 & diltiazem stopped - went to afib clinic 03/24 - ablation done 05/29/22 - continue eliquis 5mg  BID - continue metoprolol succinate 75mg  BID  4: DM- - A1c 06/25/22 was 6.1% - currently using ozempic - continue atorvastatin 10mg  daily - [facility]  Has PCP 07/24 and cardiology 08/24 so will have patient return here in 3 months, sooner if needed.

## 2022-09-23 NOTE — Patient Instructions (Addendum)
Finish your olmesartan. When you finish this, you will start taking entresto as 1 tablet in the morning and 1 tablet in the evening.

## 2022-09-24 ENCOUNTER — Encounter: Payer: Self-pay | Admitting: Psychiatry

## 2022-09-24 ENCOUNTER — Telehealth (INDEPENDENT_AMBULATORY_CARE_PROVIDER_SITE_OTHER): Payer: Medicare HMO | Admitting: Psychiatry

## 2022-09-24 DIAGNOSIS — F5101 Primary insomnia: Secondary | ICD-10-CM

## 2022-09-24 DIAGNOSIS — F411 Generalized anxiety disorder: Secondary | ICD-10-CM

## 2022-09-24 DIAGNOSIS — F3342 Major depressive disorder, recurrent, in full remission: Secondary | ICD-10-CM | POA: Diagnosis not present

## 2022-09-24 NOTE — Progress Notes (Signed)
Virtual Visit via Video Note  I connected with Regina Christensen on 09/24/22 at  8:30 AM EDT by a video enabled telemedicine application and verified that I am speaking with the correct person using two identifiers.  Location Provider Location : [facility] Patient Location : Home  Participants: Patient , Provider   I discussed the limitations of evaluation and management by telemedicine and the availability of in person appointments. The patient expressed understanding and agreed to proceed.  I discussed the assessment and treatment plan with the patient. The patient was provided an opportunity to ask questions and all were answered. The patient agreed with the plan and demonstrated an understanding of the instructions.   The patient was advised to call back or seek an in-person evaluation if the symptoms worsen or if the condition fails to improve as anticipated.   [facility] MD OP Progress Note  09/24/2022 9:03 AM VERLINDA KINNISON  MRN:  [id number]  Chief Complaint:  Chief Complaint  Patient presents with   Follow-up   Anxiety   Medication Refill   HPI: Regina Christensen is a 67 year old female, retired, lives in [place], has a history of GAD, MDD, hypertension, gastric bypass, hyperlipidemia, paroxysmal atrial fibrillation, OSA, arthritis, total arthroplasty of left knee, insomnia was evaluated by telemedicine today.  Patient today reports she is currently doing well.  Denies any significant anxiety or depression symptoms.  Patient reports sleep is overall good.  There are some nights when she is restless however she is able to get up, drink water and go back to sleep immediately.  She does not believe she needs a medication for sleep at this time.  Patient reports she is currently compliant on the venlafaxine.  Denies side effects.  Patient reports she had cardiac ablation on 05/29/2022.  I have reviewed notes per cardiology,Ms.Hackney, ECT-09/10/2022-normal sinus rhythm.  Patient has any  suicidality, homicidality or perceptual disturbances.  Patient denies any other concerns today.  Visit Diagnosis:    ICD-10-CM   1. GAD (generalized anxiety disorder)  F41.1     2. MDD (major depressive disorder), recurrent, in full remission (HCC)  F33.42     3. Primary insomnia  F51.01       Past Psychiatric History: I have reviewed past psychiatric history from progress note on 01/27/2019.  Past trials of Prozac, Effexor, trazodone, Rozerem.  Past Medical History:  Past Medical History:  Diagnosis Date   Anxiety    Arthritis    Back pain    BPPV (benign paroxysmal positional vertigo)    CHF (congestive heart failure) (HCC)    Chronic diastolic HF (heart failure) (HCC)    Complication of anesthesia    Post-operative hypoxia requiring supplemental oxygen   Current use of long term anticoagulation    Apixaban   CVA (cerebral vascular accident) (HCC) 02/28/2020   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; imaging from [facility]   Depression    Edema of both lower extremities    GERD (gastroesophageal reflux disease)    Hypertension    IBS (irritable bowel syndrome)    Lactose intolerance    Morbid obesity with BMI of 45.0-49.9, adult (HCC)    Obesity    Osteoarthritis    PAF (paroxysmal atrial fibrillation) (HCC)    Pre-diabetes    Sleep apnea    uses CPAP machine sometimes    Type 2 diabetes mellitus without complication (HCC)    Vitamin D deficiency     Past Surgical History:  Procedure  Laterality Date   ABDOMINAL HYSTERECTOMY     ATRIAL FIBRILLATION ABLATION N/A 05/29/2022   Procedure: ATRIAL FIBRILLATION ABLATION;  Surgeon: Lanier Prude, MD;  Location: [facility];  Service: Cardiovascular;  Laterality: N/A;   BREAST EXCISIONAL BIOPSY Left    CARDIOVERSION N/A 03/05/2022   Procedure: CARDIOVERSION;  Surgeon: Debbe Odea, MD;  Location: [facility];  Service: Cardiovascular;  Laterality: N/A;   ENDOMETRIAL ABLATION     x2   ENDOSCOPIC CONCHA BULLOSA  RESECTION Bilateral 08/08/2020   Procedure: ENDOSCOPIC CONCHA BULLOSA RESECTION;  Surgeon: Vernie Murders, MD;  Location: [facility];  Service: ENT;  Laterality: Bilateral;   ETHMOIDECTOMY Right 08/08/2020   Procedure: ETHMOIDECTOMY, LEFT PARTIAL ETHMOIDECTOMY;  Surgeon: Vernie Murders, MD;  Location: [facility];  Service: ENT;  Laterality: Right;   IMAGE GUIDED SINUS SURGERY N/A 08/08/2020   Procedure: IMAGE GUIDED SINUS SURGERY, RIGHT FRONTAL SINUSOTOMY;  Surgeon: Vernie Murders, MD;  Location: [facility];  Service: ENT;  Laterality: N/A;   LAPAROSCOPIC SLEEVE GASTRECTOMY     MAXILLARY ANTROSTOMY Bilateral 08/08/2020   Procedure: MAXILLARY ANTROSTOMY with tissue;  Surgeon: Vernie Murders, MD;  Location: [facility];  Service: ENT;  Laterality: Bilateral;   NASAL TURBINATE REDUCTION  08/27/2020   Procedure: TURBINATE REDUCTION /SUBMUCOSAL DEBRIDEMENT;  Surgeon: Vernie Murders, MD;  Location: [facility];  Service: ENT;;   TOTAL KNEE ARTHROPLASTY Right 06/2019   TOTAL KNEE ARTHROPLASTY Left 01/31/2021   [facility]    Family Psychiatric History: I have reviewed family psychiatric history from progress note on 01/27/2019.  Family History:  Family History  Problem Relation Age of Onset   Diabetes Mother    High blood pressure Mother    Heart disease Father    Cancer Brother    Mental illness Neg Hx     Social History: I have reviewed social history from progress note on 01/27/2019. Social History   Socioeconomic History   Marital status: Divorced    Spouse name: Not on file   Number of children: 2   Years of education: Not on file   Highest education level: Associate degree: occupational, Scientist, product/process development, or vocational program  Occupational History   Occupation: Retired/ work PT    Employer: [employer]  Tobacco Use   Smoking status: Never   Smokeless tobacco: Never  Vaping Use   Vaping Use: Never used  Substance and Sexual Activity   Alcohol use: No    Alcohol/week: 0.0 standard  drinks of alcohol    Comment: rare   Drug use: No   Sexual activity: Not on file  Other Topics Concern   Not on file  Social History Narrative   Not on file   Social Determinants of Health   Financial Resource Strain: High Risk (01/22/2022)   Overall Financial Resource Strain (CARDIA)    Difficulty of Paying Living Expenses: Very hard  Food Insecurity: No Food Insecurity (04/08/2022)   Hunger Vital Sign    Worried About Running Out of Food in the Last Year: Never true    Ran Out of Food in the Last Year: Never true  Transportation Needs: No Transportation Needs (04/08/2022)   PRAPARE - Administrator, Civil Service (Medical): No    Lack of Transportation (Non-Medical): No  Physical Activity: Insufficiently Active (10/18/2021)   Exercise Vital Sign    Days of Exercise per Week: 3 days    Minutes of Exercise per Session: 10 min  Stress: No Stress Concern Present (04/08/2022)  [employer] of Occupational Health - Occupational Stress Questionnaire    Feeling of Stress : Only a little  Social Connections: Moderately Integrated (04/08/2022)   Social Connection and Isolation Panel [NHANES]    Frequency of Communication with Friends and Family: More than three times a week    Frequency of Social Gatherings with Friends and Family: More than three times a week    Attends Religious Services: More than 4 times per year    Active Member of [employer] or Organizations: Yes    Attends Banker Meetings: Never    Marital Status: Divorced    Allergies:  Allergies  Allergen Reactions   Hydrocodone-Acetaminophen Other (See Comments)    Extreme headache   Ace Inhibitors Cough   Hctz [Hydrochlorothiazide] Rash    face    Metabolic Disorder Labs: Lab Results  Component Value Date   HGBA1C 6.1 (A) 06/25/2022   MPG 126 02/10/2022   MPG 125.5 08/16/2020   No results found for: "PROLACTIN" Lab Results  Component Value Date   CHOL 112 02/10/2022   TRIG 79  02/10/2022   HDL 66 02/10/2022   CHOLHDL 1.7 02/10/2022   VLDL 14 03/01/2020   LDLCALC 30 02/10/2022   LDLCALC 46 03/13/2021   Lab Results  Component Value Date   TSH 1.359 03/02/2022   TSH 1.250 11/14/2020    Therapeutic Level Labs: No results found for: "LITHIUM" No results found for: "VALPROATE" No results found for: "CBMZ"  Current Medications: Current Outpatient Medications  Medication Sig Dispense Refill   acetaminophen (TYLENOL) 650 MG CR tablet Take 650-1,300 mg by mouth every 8 (eight) hours as needed for pain.     apixaban (ELIQUIS) 5 MG TABS tablet Take 1 tablet (5 mg total) by mouth 2 (two) times daily. 180 tablet 3   atorvastatin (LIPITOR) 10 MG tablet TAKE ONE TABLET BY MOUTH ONCE DAILY 90 tablet 1   empagliflozin (JARDIANCE) 10 MG TABS tablet Take 1 tablet (10 mg total) by mouth daily before breakfast. 30 tablet 5   famotidine (PEPCID) 20 MG tablet Take 1 tablet (20 mg total) by mouth daily as needed for indigestion. 90 tablet 1   furosemide (LASIX) 40 MG tablet TAKE ONE TABLET BY MOUTH DAILY 90 tablet 0   gabapentin (NEURONTIN) 100 MG capsule Take 100 mg by mouth at bedtime.     meclizine (ANTIVERT) 12.5 MG tablet Take 1 tablet (12.5 mg total) by mouth 3 (three) times daily as needed for dizziness. 30 tablet 0   metoprolol succinate (TOPROL-XL) 50 MG 24 hr tablet Take 1.5 tablets (75 mg total) by mouth in the morning and at bedtime. Take with or immediately following a meal. 270 tablet 3   Multiple Vitamin (MULTIVITAMIN) tablet Take 1 tablet by mouth daily. BariatricPal Multivitamin     pantoprazole (PROTONIX) 40 MG tablet Take 1 tablet (40 mg total) by mouth daily. 90 tablet 1   potassium chloride (KLOR-CON M) 10 MEQ tablet TAKE TWO TABLETS BY MOUTH DAILY 180 tablet 0   sacubitril-valsartan (ENTRESTO) 24-26 MG Take 1 tablet by mouth 2 (two) times daily. Rxbin [id number] rxpcn [facility] rxgrp [id number] rxid [id number] 60 tablet 3   Semaglutide, 2 MG/DOSE, (OZEMPIC, 2  MG/DOSE,) 8 MG/3ML SOPN Inject 2 mg into the skin once a week. 9 mL 1   spironolactone (ALDACTONE) 25 MG tablet Take 0.5 tablets (12.5 mg total) by mouth daily. 45 tablet 3   triamcinolone (NASACORT) 55 MCG/ACT AERO nasal inhaler Place 2 sprays into the  nose daily.     venlafaxine XR (EFFEXOR-XR) 75 MG 24 hr capsule Take 1 capsule (75 mg total) by mouth daily with breakfast. 90 capsule 1   Vitamin D, Ergocalciferol, (DRISDOL) 1.25 MG (50000 UNIT) CAPS capsule TAKE ONE CAPSULE BY MOUTH EVERY 7 DAYS 12 capsule 0   No current facility-administered medications for this visit.     Musculoskeletal: Strength & Muscle Tone:  UTA Gait & Station:  Seated Patient leans: N/A  Psychiatric Specialty Exam: Review of Systems  Psychiatric/Behavioral: Negative.      There were no vitals taken for this visit.There is no height or weight on file to calculate BMI.  General Appearance: Fairly Groomed  Eye Contact:  Fair  Speech:  Normal Rate  Volume:  Normal  Mood:  Euthymic  Affect:  Congruent  Thought Process:  Goal Directed and Descriptions of Associations: Intact  Orientation:  Full (Time, Place, and Person)  Thought Content: Logical   Suicidal Thoughts:  No  Homicidal Thoughts:  No  Memory:  Immediate;   Fair Recent;   Fair Remote;   Fair  Judgement:  Fair  Insight:  Fair  Psychomotor Activity:  Normal  Concentration:  Concentration: Fair and Attention Span: Fair  Recall:  [employer] of Knowledge: Fair  Language: Fair  Akathisia:  No  Handed:  Right  AIMS (if indicated): not done  Assets:  Communication Skills Desire for Improvement Housing Social Support  ADL's:  Intact  Cognition: WNL  Sleep:  Fair   Screenings: AIMS    Flowsheet Row Video Visit from 09/27/2021 in [facility]  AIMS Total Score 0      GAD-7    Flowsheet Row Video Visit from 09/24/2022 in [facility] Office Visit from 04/25/2022  in [facility] Office Visit from 02/10/2022 in [facility] Video Visit from 09/27/2021 in [facility] Video Visit from 02/13/2021 in [facility]  Total GAD-7 Score 0 2 1 2  0      PHQ2-9    Flowsheet Row Video Visit from 09/24/2022 in [facility] Office Visit from 06/25/2022 in [facility] Office Visit from 04/25/2022 in [facility] Chronic Care Management from 04/08/2022 in [facility] Video Visit from 03/19/2022 in [facility]  PHQ-2 Total Score 0 0 0 0 0  PHQ-9 Total Score -- 2 -- -- --      Flowsheet Row Video Visit from 09/24/2022 in [facility] Admission (Discharged) from 05/29/2022 in [facility] Office Visit from 04/25/2022 in [facility]  C-SSRS RISK CATEGORY No Risk No Risk No Risk        Assessment and Plan: MARAIYA BERNATH is a 67 year old African-American female, divorced, retired, lives in [place], has a history of anxiety, depression, insomnia, multiple medical problems including paroxysmal atrial fibrillation was evaluated by telemedicine today.  Patient is currently stable.  Discussed transitioning back to primary care provider.  Patient to discuss with primary care provider in July.  Plan as noted below.  Plan GAD-stable Venlafaxine extended release 75 mg p.o. daily.  Reduced dosage.  MDD in remission Venlafaxine extended release 75 mg p.o. daily.  Primary insomnia-stable Continue sleep hygiene techniques   Patient agreeable to transitioning back to  primary care provider, Dr.Sowles.  Patient to discuss with primary care provider at her appointment in  July.  If not patient could return back in 6 months at this office, in person.   Collaboration of Care: Collaboration of Care: Primary Care Provider AEB patient to follow up with primary care provider.  Patient/Guardian was advised Release of Information must be obtained prior to any record release in order to collaborate their care with an outside provider. Patient/Guardian was advised if they have not already done so to contact the registration department to sign all necessary forms in order for [place] to release information regarding their care.   Consent: Patient/Guardian gives verbal consent for treatment and assignment of benefits for services provided during this visit. Patient/Guardian expressed understanding and agreed to proceed.   This note was generated in part or whole with voice recognition software. Voice recognition is usually quite accurate but there are transcription errors that can and very often do occur. I apologize for any typographical errors that were not detected and corrected.      Jomarie Longs, MD 09/24/2022, 9:03 AM

## 2022-09-26 ENCOUNTER — Other Ambulatory Visit: Payer: Self-pay | Admitting: Family Medicine

## 2022-09-26 DIAGNOSIS — I5032 Chronic diastolic (congestive) heart failure: Secondary | ICD-10-CM

## 2022-10-02 ENCOUNTER — Telehealth: Payer: Self-pay | Admitting: Cardiology

## 2022-10-02 ENCOUNTER — Encounter: Payer: Self-pay | Admitting: Family

## 2022-10-02 NOTE — Telephone Encounter (Signed)
Pt c/o medication issue:  1. Name of Medication: Entresto 24-26 MG  2. How are you currently taking this medication (dosage and times per day)? 1 tablet 2 times daily   3. Are you having a reaction (difficulty breathing--STAT)? Heart racing  4. What is your medication issue? Feels heart racing when she takes it and then it stops eventually.  Would like to know if this is a side effect of the medication? Please call to discuss

## 2022-10-03 ENCOUNTER — Other Ambulatory Visit
Admission: RE | Admit: 2022-10-03 | Discharge: 2022-10-03 | Disposition: A | Discharge status: Home / Self Care | Payer: Medicare HMO | Attending: Family Medicine | Admitting: Family Medicine

## 2022-10-03 ENCOUNTER — Ambulatory Visit: Payer: Medicare HMO

## 2022-10-03 ENCOUNTER — Telehealth: Payer: Self-pay

## 2022-10-03 DIAGNOSIS — I5032 Chronic diastolic (congestive) heart failure: Secondary | ICD-10-CM | POA: Diagnosis not present

## 2022-10-03 DIAGNOSIS — I5022 Chronic systolic (congestive) heart failure: Secondary | ICD-10-CM

## 2022-10-03 LAB — BASIC METABOLIC PANEL
Anion gap: 11 (ref 5–15)
BUN: 13 mg/dL (ref 8–23)
CO2: 23 mmol/L (ref 22–32)
Calcium: 9 mg/dL (ref 8.9–10.3)
Chloride: 104 mmol/L (ref 98–111)
Creatinine, Ser: 0.79 mg/dL (ref 0.44–1.00)
GFR, Estimated: >60 mL/min (ref >60)
Glucose, Bld: 85 mg/dL (ref 70–99)
Potassium: 3.6 mmol/L (ref 3.5–5.1)
Sodium: 138 mmol/L (ref 135–145)

## 2022-10-03 LAB — BRAIN NATRIURETIC PEPTIDE: B Natriuretic Peptide: 36 pg/mL (ref 0.0–100.0)

## 2022-10-03 NOTE — Telephone Encounter (Signed)
Spoke with patient per Prince Rome, FNP who suggested that we get patient in for a BMET and BNP lab draw. Patient stated she would be out of town all next week but would like to come get lab draw today. Lab orders were placed and patient was given instructions on where to get those drawn.

## 2022-10-03 NOTE — Progress Notes (Signed)
Patient presented for routine blood pressure check. She stated she just wanted to have it measured. It was 120/80 and patient expressed appreciation and relief as she is headed on vacation. She had no questions or concerns to express at time of clinic departure.

## 2022-10-06 NOTE — Telephone Encounter (Signed)
Patient is experiencing heart racing when she takes Entresto 24/26.  Any recommendations?

## 2022-10-07 MED ORDER — OLMESARTAN MEDOXOMIL 20 MG PO TABS: 20.0000 mg | ORAL_TABLET | Freq: Every day | ORAL | 3 refills | Status: DC

## 2022-10-07 NOTE — Telephone Encounter (Signed)
Called patient.  No answer. Left detailed message on VM - okay per DPR with the follow instructions from Dr. Azucena Cecil  Due to not tolerating Entresto.  Stop Entresto, restart Benicar 20 mg daily which she was previously taking.   Medication list was updated and new medication was sent in.

## 2022-10-09 ENCOUNTER — Ambulatory Visit: Payer: Medicare HMO | Admitting: Cardiology

## 2022-10-22 ENCOUNTER — Ambulatory Visit: Payer: Medicare HMO | Admitting: Family Medicine

## 2022-10-27 DIAGNOSIS — Z96651 Presence of right artificial knee joint: Secondary | ICD-10-CM | POA: Diagnosis not present

## 2022-10-27 DIAGNOSIS — Z96652 Presence of left artificial knee joint: Secondary | ICD-10-CM | POA: Diagnosis not present

## 2022-10-27 NOTE — Patient Instructions (Incomplete)
Regina Christensen , Thank you for taking time to come for your Medicare Wellness Visit. I appreciate your ongoing commitment to your health goals. Please review the following plan we discussed and let me know if I can assist you in the future.   These are the goals we discussed:  Goals      Remain active and independent        This is a list of the screening recommended for you and due dates:  Health Maintenance  Topic Date Due   Eye exam for diabetics  Never done   COVID-19 Vaccine (4 - 2023-24 season) 12/20/2021   Complete foot exam   10/19/2022   Flu Shot  11/20/2022   Hemoglobin A1C  12/26/2022   Yearly kidney health urinalysis for diabetes  02/11/2023   Yearly kidney function blood test for diabetes  10/03/2023   Medicare Annual Wellness Visit  10/28/2023   Mammogram  05/07/2024   Colon Cancer Screening  11/29/2025   DTaP/Tdap/Td vaccine (2 - Td or Tdap) 03/14/2031   Pneumonia Vaccine  Completed   DEXA scan (bone density measurement)  Completed   Hepatitis C Screening  Completed   Zoster (Shingles) Vaccine  Completed   HPV Vaccine  Aged Out    Advanced directives: Information on Advanced Care Planning can be found at [facility] Advance Health Care Directives Advance Health Care Directives ([email or web address] Please bring a copy of your health care power of attorney and living will to the office to be added to your chart at your convenience.  Conditions/risks identified: Aim for 30 minutes of exercise or brisk walking, 6-8 glasses of water, and 5 servings of fruits and vegetables each day.  Next appointment: Follow up in one year for your annual wellness visit    Preventive Care 65 Years and Older, Female Preventive care refers to lifestyle choices and visits with your health care provider that can promote health and wellness. What does preventive care include? A yearly physical exam. This is also called an annual well check. Dental exams once or twice a  year. Routine eye exams. Ask your health care provider how often you should have your eyes checked. Personal lifestyle choices, including: Daily care of your teeth and gums. Regular physical activity. Eating a healthy diet. Avoiding tobacco and drug use. Limiting alcohol use. Practicing safe sex. Taking low-dose aspirin every day. Taking vitamin and mineral supplements as recommended by your health care provider. What happens during an annual well check? The services and screenings done by your health care provider during your annual well check will depend on your age, overall health, lifestyle risk factors, and family history of disease. Counseling  Your health care provider may ask you questions about your: Alcohol use. Tobacco use. Drug use. Emotional well-being. Home and relationship well-being. Sexual activity. Eating habits. History of falls. Memory and ability to understand (cognition). Work and work Astronomer. Reproductive health. Screening  You may have the following tests or measurements: Height, weight, and BMI. Blood pressure. Lipid and cholesterol levels. These may be checked every 5 years, or more frequently if you are over 40 years old. Skin check. Lung cancer screening. You may have this screening every year starting at age 71 if you have a 30-pack-year history of smoking and currently smoke or have quit within the past 15 years. Fecal occult blood test (FOBT) of the stool. You may have this test every year starting at age 10. Flexible sigmoidoscopy or colonoscopy. You may have  a sigmoidoscopy every 5 years or a colonoscopy every 10 years starting at age 80. Hepatitis C blood test. Hepatitis B blood test. Sexually transmitted disease (STD) testing. Diabetes screening. This is done by checking your blood sugar (glucose) after you have not eaten for a while (fasting). You may have this done every 1-3 years. Bone density scan. This is done to screen for  osteoporosis. You may have this done starting at age 63. Mammogram. This may be done every 1-2 years. Talk to your health care provider about how often you should have regular mammograms. Talk with your health care provider about your test results, treatment options, and if necessary, the need for more tests. Vaccines  Your health care provider may recommend certain vaccines, such as: Influenza vaccine. This is recommended every year. Tetanus, diphtheria, and acellular pertussis (Tdap, Td) vaccine. You may need a Td booster every 10 years. Zoster vaccine. You may need this after age 60. Pneumococcal 13-valent conjugate (PCV13) vaccine. One dose is recommended after age 30. Pneumococcal polysaccharide (PPSV23) vaccine. One dose is recommended after age 41. Talk to your health care provider about which screenings and vaccines you need and how often you need them. This information is not intended to replace advice given to you by your health care provider. Make sure you discuss any questions you have with your health care provider. Document Released: 05/04/2015 Document Revised: 12/26/2015 Document Reviewed: 02/06/2015 Elsevier Interactive Patient Education  2017 [employer].  Fall Prevention in the Home Falls can cause injuries. They can happen to people of all ages. There are many things you can do to make your home safe and to help prevent falls. What can I do on the outside of my home? Regularly fix the edges of walkways and driveways and fix any cracks. Remove anything that might make you trip as you walk through a door, such as a raised step or threshold. Trim any bushes or trees on the path to your home. Use bright outdoor lighting. Clear any walking paths of anything that might make someone trip, such as rocks or tools. Regularly check to see if handrails are loose or broken. Make sure that both sides of any steps have handrails. Any raised decks and porches should have guardrails on  the edges. Have any leaves, snow, or ice cleared regularly. Use sand or salt on walking paths during winter. Clean up any spills in your garage right away. This includes oil or grease spills. What can I do in the bathroom? Use night lights. Install grab bars by the toilet and in the tub and shower. Do not use towel bars as grab bars. Use non-skid mats or decals in the tub or shower. If you need to sit down in the shower, use a plastic, non-slip stool. Keep the floor dry. Clean up any water that spills on the floor as soon as it happens. Remove soap buildup in the tub or shower regularly. Attach bath mats securely with double-sided non-slip rug tape. Do not have throw rugs and other things on the floor that can make you trip. What can I do in the bedroom? Use night lights. Make sure that you have a light by your bed that is easy to reach. Do not use any sheets or blankets that are too big for your bed. They should not hang down onto the floor. Have a firm chair that has side arms. You can use this for support while you get dressed. Do not have throw rugs and  other things on the floor that can make you trip. What can I do in the kitchen? Clean up any spills right away. Avoid walking on wet floors. Keep items that you use a lot in easy-to-reach places. If you need to reach something above you, use a strong step stool that has a grab bar. Keep electrical cords out of the way. Do not use floor polish or wax that makes floors slippery. If you must use wax, use non-skid floor wax. Do not have throw rugs and other things on the floor that can make you trip. What can I do with my stairs? Do not leave any items on the stairs. Make sure that there are handrails on both sides of the stairs and use them. Fix handrails that are broken or loose. Make sure that handrails are as long as the stairways. Check any carpeting to make sure that it is firmly attached to the stairs. Fix any carpet that is loose  or worn. Avoid having throw rugs at the top or bottom of the stairs. If you do have throw rugs, attach them to the floor with carpet tape. Make sure that you have a light switch at the top of the stairs and the bottom of the stairs. If you do not have them, ask someone to add them for you. What else can I do to help prevent falls? Wear shoes that: Do not have high heels. Have rubber bottoms. Are comfortable and fit you well. Are closed at the toe. Do not wear sandals. If you use a stepladder: Make sure that it is fully opened. Do not climb a closed stepladder. Make sure that both sides of the stepladder are locked into place. Ask someone to hold it for you, if possible. Clearly mark and make sure that you can see: Any grab bars or handrails. First and last steps. Where the edge of each step is. Use tools that help you move around (mobility aids) if they are needed. These include: Canes. Walkers. Scooters. Crutches. Turn on the lights when you go into a dark area. Replace any light bulbs as soon as they burn out. Set up your furniture so you have a clear path. Avoid moving your furniture around. If any of your floors are uneven, fix them. If there are any pets around you, be aware of where they are. Review your medicines with your doctor. Some medicines can make you feel dizzy. This can increase your chance of falling. Ask your doctor what other things that you can do to help prevent falls. This information is not intended to replace advice given to you by your health care provider. Make sure you discuss any questions you have with your health care provider. Document Released: 02/01/2009 Document Revised: 09/13/2015 Document Reviewed: 05/12/2014 Elsevier Interactive Patient Education  2017 [employer].

## 2022-10-27 NOTE — Progress Notes (Unsigned)
Subjective:   Regina Christensen is a 67 y.o. female who presents for an Initial Medicare Annual Wellness Visit.  Visit Complete: {VISITMETHOD:[phone]}  Patient Medicare AWV questionnaire was completed by the patient on ***; I have confirmed that all information answered by patient is correct and no changes since this date.  Review of Systems    ***       Objective:    There were no vitals filed for this visit. There is no height or weight on file to calculate BMI.     05/29/2022   10:58 AM 03/05/2022    7:52 AM 03/02/2022   10:01 AM 08/27/2020    7:08 AM 08/24/2020    9:37 AM 08/16/2020    2:01 PM 08/15/2020   11:09 PM  Advanced Directives  Does Patient Have a Medical Advance Directive? No No No No No No No  Would patient like information on creating a medical advance directive? No - Patient declined No - Patient declined No - Guardian declined No - Patient declined No - Patient declined No - Patient declined No - Patient declined    Current Medications (verified) Outpatient Encounter Medications as of 10/28/2022  Medication Sig   olmesartan (BENICAR) 20 MG tablet Take 1 tablet (20 mg total) by mouth daily.   acetaminophen (TYLENOL) 650 MG CR tablet Take 650-1,300 mg by mouth every 8 (eight) hours as needed for pain.   apixaban (ELIQUIS) 5 MG TABS tablet Take 1 tablet (5 mg total) by mouth 2 (two) times daily.   atorvastatin (LIPITOR) 10 MG tablet TAKE ONE TABLET BY MOUTH ONCE DAILY   empagliflozin (JARDIANCE) 10 MG TABS tablet Take 1 tablet (10 mg total) by mouth daily before breakfast.   famotidine (PEPCID) 20 MG tablet Take 1 tablet (20 mg total) by mouth daily as needed for indigestion.   furosemide (LASIX) 40 MG tablet Take 1 tablet by mouth once daily.   gabapentin (NEURONTIN) 100 MG capsule Take 100 mg by mouth at bedtime.   meclizine (ANTIVERT) 12.5 MG tablet Take 1 tablet (12.5 mg total) by mouth 3 (three) times daily as needed for dizziness.   metoprolol succinate  (TOPROL-XL) 50 MG 24 hr tablet Take 1.5 tablets (75 mg total) by mouth in the morning and at bedtime. Take with or immediately following a meal.   Multiple Vitamin (MULTIVITAMIN) tablet Take 1 tablet by mouth daily. BariatricPal Multivitamin   pantoprazole (PROTONIX) 40 MG tablet Take 1 tablet (40 mg total) by mouth daily.   potassium chloride (KLOR-CON M) 10 MEQ tablet TAKE TWO TABLETS BY MOUTH DAILY   Semaglutide, 2 MG/DOSE, (OZEMPIC, 2 MG/DOSE,) 8 MG/3ML SOPN Inject 2 mg into the skin once a week.   spironolactone (ALDACTONE) 25 MG tablet Take 0.5 tablets (12.5 mg total) by mouth daily.   triamcinolone (NASACORT) 55 MCG/ACT AERO nasal inhaler Place 2 sprays into the nose daily.   venlafaxine XR (EFFEXOR-XR) 75 MG 24 hr capsule Take 1 capsule (75 mg total) by mouth daily with breakfast.   Vitamin D, Ergocalciferol, (DRISDOL) 1.25 MG (50000 UNIT) CAPS capsule TAKE ONE CAPSULE BY MOUTH EVERY 7 DAYS   No facility-administered encounter medications on file as of 10/28/2022.    Allergies (verified) Hydrocodone-acetaminophen, Ace inhibitors, and Hctz [hydrochlorothiazide]   History: Past Medical History:  Diagnosis Date   Anxiety    Arthritis    Back pain    BPPV (benign paroxysmal positional vertigo)    CHF (congestive heart failure) (HCC)    Chronic  diastolic HF (heart failure) (HCC)    Complication of anesthesia    Post-operative hypoxia requiring supplemental oxygen   Current use of long term anticoagulation    Apixaban   CVA (cerebral vascular accident) (HCC) 02/28/2020   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; imaging from [facility]   Depression    Edema of both lower extremities    GERD (gastroesophageal reflux disease)    Hypertension    IBS (irritable bowel syndrome)    Lactose intolerance    Morbid obesity with BMI of 45.0-49.9, adult (HCC)    Obesity    Osteoarthritis    PAF (paroxysmal atrial fibrillation) (HCC)    Pre-diabetes    Sleep apnea    uses CPAP machine  sometimes    Type 2 diabetes mellitus without complication (HCC)    Vitamin D deficiency    Past Surgical History:  Procedure Laterality Date   ABDOMINAL HYSTERECTOMY     ATRIAL FIBRILLATION ABLATION N/A 05/29/2022   Procedure: ATRIAL FIBRILLATION ABLATION;  Surgeon: Lanier Prude, MD;  Location: [facility];  Service: Cardiovascular;  Laterality: N/A;   BREAST EXCISIONAL BIOPSY Left    CARDIOVERSION N/A 03/05/2022   Procedure: CARDIOVERSION;  Surgeon: Debbe Odea, MD;  Location: [facility];  Service: Cardiovascular;  Laterality: N/A;   ENDOMETRIAL ABLATION     x2   ENDOSCOPIC CONCHA BULLOSA RESECTION Bilateral 08/08/2020   Procedure: ENDOSCOPIC CONCHA BULLOSA RESECTION;  Surgeon: Vernie Murders, MD;  Location: [facility];  Service: ENT;  Laterality: Bilateral;   ETHMOIDECTOMY Right 08/08/2020   Procedure: ETHMOIDECTOMY, LEFT PARTIAL ETHMOIDECTOMY;  Surgeon: Vernie Murders, MD;  Location: [facility];  Service: ENT;  Laterality: Right;   IMAGE GUIDED SINUS SURGERY N/A 08/08/2020   Procedure: IMAGE GUIDED SINUS SURGERY, RIGHT FRONTAL SINUSOTOMY;  Surgeon: Vernie Murders, MD;  Location: [facility];  Service: ENT;  Laterality: N/A;   LAPAROSCOPIC SLEEVE GASTRECTOMY     MAXILLARY ANTROSTOMY Bilateral 08/08/2020   Procedure: MAXILLARY ANTROSTOMY with tissue;  Surgeon: Vernie Murders, MD;  Location: [facility];  Service: ENT;  Laterality: Bilateral;   NASAL TURBINATE REDUCTION  08/27/2020   Procedure: TURBINATE REDUCTION /SUBMUCOSAL DEBRIDEMENT;  Surgeon: Vernie Murders, MD;  Location: [facility];  Service: ENT;;   TOTAL KNEE ARTHROPLASTY Right 06/2019   TOTAL KNEE ARTHROPLASTY Left 01/31/2021   [facility]   Family History  Problem Relation Age of Onset   Diabetes Mother    High blood pressure Mother    Heart disease Father    Cancer Brother    Mental illness Neg Hx    Social History   Socioeconomic History   Marital status: Divorced    Spouse name: Not on  file   Number of children: 2   Years of education: Not on file   Highest education level: Associate degree: occupational, Scientist, product/process development, or vocational program  Occupational History   Occupation: Retired/ work PT    Employer: [employer]  Tobacco Use   Smoking status: Never   Smokeless tobacco: Never  Vaping Use   Vaping Use: Never used  Substance and Sexual Activity   Alcohol use: No    Alcohol/week: 0.0 standard drinks of alcohol    Comment: rare   Drug use: No   Sexual activity: Not on file  Other Topics Concern   Not on file  Social History Narrative   Not on file   Social Determinants of Health   Financial Resource Strain: High Risk (01/22/2022)   Overall Financial  Resource Strain (CARDIA)    Difficulty of Paying Living Expenses: Very hard  Food Insecurity: No Food Insecurity (04/08/2022)   Hunger Vital Sign    Worried About Running Out of Food in the Last Year: Never true    Ran Out of Food in the Last Year: Never true  Transportation Needs: No Transportation Needs (04/08/2022)   PRAPARE - Administrator, Civil Service (Medical): No    Lack of Transportation (Non-Medical): No  Physical Activity: Insufficiently Active (10/18/2021)   Exercise Vital Sign    Days of Exercise per Week: 3 days    Minutes of Exercise per Session: 10 min  Stress: No Stress Concern Present (04/08/2022)   [employer] of Occupational Health - Occupational Stress Questionnaire    Feeling of Stress : Only a little  Social Connections: Moderately Integrated (04/08/2022)   Social Connection and Isolation Panel [NHANES]    Frequency of Communication with Friends and Family: More than three times a week    Frequency of Social Gatherings with Friends and Family: More than three times a week    Attends Religious Services: More than 4 times per year    Active Member of [employer] or Organizations: Yes    Attends Banker Meetings: Never    Marital Status: Divorced    Tobacco  Counseling Counseling given: Not Answered   Clinical Intake:                        Activities of Daily Living    06/25/2022    8:21 AM 03/19/2022    1:13 PM  In your present state of health, do you have any difficulty performing the following activities:  Hearing? 0 0  Vision? 0 0  Difficulty concentrating or making decisions? 0 0  Walking or climbing stairs? 0 1  Dressing or bathing? 0 0  Doing errands, shopping? 0 0    Patient Care Team: [name], MD as PCP - General (Family Medicine) Lanier Prude, MD as PCP - Electrophysiology (Cardiology) Debbe Odea, MD as PCP - Cardiology (Cardiology) Lyndle Herrlich, MD as Consulting Physician (Orthopedic Surgery) Debbe Odea, MD as Consulting Physician (Cardiology) Gaspar Cola, RPH (Inactive) (Pharmacist) Juanell Fairly, RN as Case Manager  Indicate any recent Medical Services you may have received from other than [facility] providers in the past year (date may be approximate).     Assessment:   This is a routine wellness examination for [place].  Hearing/Vision screen No results found.  Dietary issues and exercise activities discussed:     Goals Addressed   None    Depression Screen    09/24/2022    8:39 AM 06/25/2022    8:21 AM 04/25/2022    9:12 AM 04/08/2022    4:15 PM 03/19/2022    1:14 PM 03/10/2022    7:51 AM 02/12/2022   10:13 AM  PHQ 2/9 Scores  PHQ - 2 Score  0  0 0 0 0  PHQ- 9 Score  2    0      Information is confidential and restricted. Go to Review Flowsheets to unlock data.    Fall Risk    06/25/2022    8:20 AM 04/08/2022    4:15 PM 03/19/2022    1:13 PM 03/10/2022    7:51 AM 02/12/2022   10:12 AM  Fall Risk   Falls in the past year? 0 0 0 0 0  Number falls in past  yr: 0 0 0 0 0  Injury with Fall? 0  0 0 0  Risk for fall due to : No Fall Risks Medication side effect  No Fall Risks   Follow up Falls prevention discussed Falls prevention discussed  Falls evaluation completed Falls prevention discussed Falls evaluation completed    MEDICARE RISK AT HOME:   TIMED UP AND GO:  Was the test performed? No    Cognitive Function:        Immunizations Immunization History  Administered Date(s) Administered   Fluad Quad(high Dose 65+) 01/16/2022   Influenza, Quadrivalent, Recombinant, Inj, Pf 01/19/2018   Influenza,inj,Quad PF,6+ Mos 01/11/2016, 12/24/2018, 03/05/2020, 03/13/2021   Influenza-Unspecified 01/30/2017, 01/19/2018   PFIZER(Purple Top)SARS-COV-2 Vaccination 07/17/2019, 08/10/2019, 03/17/2020   PNEUMOCOCCAL CONJUGATE-20 09/10/2021   Pneumococcal Conjugate-13 04/05/2015   Tdap 03/13/2021   Zoster Recombinant(Shingrix) 03/12/2018, 08/17/2018    TDAP status: Up to date  Pneumococcal vaccine status: Up to date  Covid-19 vaccine status: Information provided on how to obtain vaccines.   Qualifies for Shingles Vaccine? Yes   Zostavax completed No   Shingrix Completed?: Yes  Screening Tests Health Maintenance  Topic Date Due   OPHTHALMOLOGY EXAM  Never done   COVID-19 Vaccine (4 - 2023-24 season) 12/20/2021   FOOT EXAM  10/19/2022   Medicare Annual Wellness (AWV)  10/29/2022   INFLUENZA VACCINE  11/20/2022   HEMOGLOBIN A1C  12/26/2022   Diabetic kidney evaluation - Urine ACR  02/11/2023   Diabetic kidney evaluation - eGFR measurement  10/03/2023   MAMMOGRAM  05/07/2024   Colonoscopy  11/29/2025   DTaP/Tdap/Td (2 - Td or Tdap) 03/14/2031   Pneumonia Vaccine 75+ Years old  Completed   DEXA SCAN  Completed   Hepatitis C Screening  Completed   Zoster Vaccines- Shingrix  Completed   HPV VACCINES  Aged Out    Health Maintenance  Health Maintenance Due  Topic Date Due   OPHTHALMOLOGY EXAM  Never done   COVID-19 Vaccine (4 - 2023-24 season) 12/20/2021   FOOT EXAM  10/19/2022   Medicare Annual Wellness (AWV)  10/29/2022    Colorectal cancer screening: Type of screening: Colonoscopy. Completed 11/20/15. Repeat  every 10 years  Mammogram status: Completed 05/07/22. Repeat every year  Bone Density status: Completed 10/28/21. Results reflect: Bone density results: OSTEOPENIA. Repeat every 52 years.  Lung Cancer Screening: (Low Dose CT Chest recommended if Age 58-80 years, 20 pack-year currently smoking OR have quit w/in 15years.) does not qualify.   Lung Cancer Screening Referral: n/a  Additional Screening:  Hepatitis C Screening: does qualify; Completed 11/19/12  Vision Screening: Recommended annual ophthalmology exams for early detection of glaucoma and other disorders of the eye. Is the patient up to date with their annual eye exam?  {YES/NO:21197} Who is the provider or what is the name of the office in which the patient attends annual eye exams? *** If pt is not established with a provider, would they like to be referred to a provider to establish care? {YES/NO:21197}.   Dental Screening: Recommended annual dental exams for proper oral hygiene  Diabetic Foot Exam: Diabetic Foot Exam: Overdue, Pt has been advised about the importance in completing this exam. Pt is scheduled for diabetic foot exam on at next office visit.  Community Resource Referral / Chronic Care Management: CRR required this visit?  {YES/NO:21197}  CCM required this visit?  {CCM Required choices:[phone]}     Plan:     I have personally reviewed and noted the following in  the patient's chart:   Medical and social history Use of alcohol, tobacco or illicit drugs  Current medications and supplements including opioid prescriptions. {Opioid Prescriptions:[phone]} Functional ability and status Nutritional status Physical activity Advanced directives List of other physicians Hospitalizations, surgeries, and ER visits in previous 12 months Vitals Screenings to include cognitive, depression, and falls Referrals and appointments  In addition, I have reviewed and discussed with patient certain preventive protocols,  quality metrics, and best practice recommendations. A written personalized care plan for preventive services as well as general preventive health recommendations were provided to patient.     Kandis Fantasia [place], [place]   04/26/1094   After Visit Summary: {CHL AMB AWV After Visit Summary:[phone]}  Nurse Notes: ***

## 2022-10-28 ENCOUNTER — Ambulatory Visit (INDEPENDENT_AMBULATORY_CARE_PROVIDER_SITE_OTHER): Payer: Medicare HMO

## 2022-10-28 VITALS — Ht 68.0 in | Wt 249.0 lb

## 2022-10-28 DIAGNOSIS — Z0000 Encounter for general adult medical examination without abnormal findings: Secondary | ICD-10-CM | POA: Diagnosis not present

## 2022-10-28 NOTE — Progress Notes (Unsigned)
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  2.  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  3.  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    MR Angio neck and head with and without contrast was normal   Vertigo: she was diagnosed with BPPV previously had Epley maneuver,, she had vestibular rehab but still needs to take meclizine occasionally and needs a refill today   Afib: She had an ablation done on 05/29/2022 and doing well since , still on Eliquis She is following up with afib clinic this week   CHF chronic combined  systolic/diastolic  : Echo in 4098 showed EF 60 % and in Nov 2023 it was down to 40-45%, she is going to CHF clinic and is compliant with her medications. She has SOB with mild activity but improves with rest. She states lower extremity edema is very mild She is not sure if she has orthopnea since she does not lay flat in bed. She also has mild pulmonary hypertension and some mild to moderate MR and moderate to severe TR. She was started on Jardiance this week    MDD/GAD: she  is under the care of Dr. Elna Breslow . 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 She is compliant with medication and is taking Effexor 75 mg  as prescribed. Phq 9 is negative   Insomnia: she states Dr. Elna Breslow , she was given rx of Rozerem but states it caused nightmares, she was given Ambien but she states does not take it due to nightmares and worries about side effects . She states unable to use CPAP since it keeps her from sleeping, advised to discuss  it again with Dr. Elna Breslow    HTN: she is currently on cardizem, metoprolol , aldactone and Benicar. No chest pain or palpitation . She has SOB with activity due to CHF. She has been compliant with low salt diet and medications   OSA/Pulmonary hypertension: explained importance of resuming using her CPAP m: she states only wears CPAP machine occasionally   Morbid obesity: [facility]  [facility] She has a history of sleeve  bariatric surgery about 10 years ago, her weight prior to surgery was 300 lbs and  she lost 50 lbs and has been stable. Weight at home has been between 255-260 lbs at home. She will discussed CHF rehab to improve physical activity   History of bilateral knee replacement: right in 2021 and left Oct 2022, she is only using a cane now when walking long distance, pain is mild and stable.    DMII: with hypertension and dyslipidemia, diagnosed by the [facility]. She is still taking Ozempic , no side effects. She  denies polyphagia, polyuria or polydipsia. A1C is at goal . Positive for urine micro in October, started on Jardiance by chf clinic and is also taking ARB  Patient Active Problem List   Diagnosis Date Noted   Hypercoagulable state due to persistent atrial fibrillation (HCC) 06/26/2022  Abnormal CT of the abdomen 05/06/2022   Primary insomnia 04/25/2022   Chronic combined systolic and diastolic CHF (congestive heart failure) (HCC) 03/06/2022   Obesity (BMI 30-39.9) 03/04/2022   HFrEF (heart failure with reduced ejection fraction) (HCC) 03/03/2022   Primary hypertension 03/03/2022   Atrial fibrillation with RVR (HCC) 03/02/2022   Transaminitis 03/02/2022   Dilated cardiomyopathy (HCC) 03/02/2022   Nonrheumatic tricuspid valve regurgitation 03/02/2022   Osteopenia of necks of both femurs 01/22/2022   Bilateral lower extremity edema 09/30/2021   History of total bilateral knee replacement 09/10/2021   History of blood transfusion  09/10/2020   Current use of long term anticoagulation    Diabetes mellitus (HCC)    Paroxysmal atrial fibrillation (HCC)    GERD without esophagitis    Depression    Vitamin D deficiency 04/12/2020   History of CVA (cerebrovascular accident) without residual deficits 03/05/2020   History of hysterectomy 07/19/2019   Anxiety 07/19/2019   MDD (major depressive disorder), recurrent, in full remission (HCC) 07/19/2019   Insomnia due to mental condition 07/19/2019   Persistent atrial fibrillation (HCC) 02/08/2019   Bilateral carpal tunnel syndrome 07/30/2016   OSA (obstructive sleep apnea) 03/07/2016   BPPV (benign paroxysmal positional vertigo) 08/28/2015   Hypertension associated with type 2 diabetes mellitus (HCC) 11/09/2014   GAD (generalized anxiety disorder) 11/09/2014   Acanthosis nigricans 11/09/2014   Morbid obesity (HCC) 01/06/2012    Past Surgical History:  Procedure Laterality Date   ABDOMINAL HYSTERECTOMY     ATRIAL FIBRILLATION ABLATION N/A 05/29/2022   Procedure: ATRIAL FIBRILLATION ABLATION;  Surgeon: Lanier Prude, MD;  Location: [facility];  Service: Cardiovascular;  Laterality: N/A;   BREAST EXCISIONAL BIOPSY Left    CARDIOVERSION N/A 03/05/2022   Procedure: CARDIOVERSION;  Surgeon: Debbe Odea, MD;  Location: [facility];  Service: Cardiovascular;  Laterality: N/A;   ENDOMETRIAL ABLATION     x2   ENDOSCOPIC CONCHA BULLOSA RESECTION Bilateral 08/08/2020   Procedure: ENDOSCOPIC CONCHA BULLOSA RESECTION;  Surgeon: Vernie Murders, MD;  Location: [facility];  Service: ENT;  Laterality: Bilateral;   ETHMOIDECTOMY Right 08/08/2020   Procedure: ETHMOIDECTOMY, LEFT PARTIAL ETHMOIDECTOMY;  Surgeon: Vernie Murders, MD;  Location: [facility];  Service: ENT;  Laterality: Right;   IMAGE GUIDED SINUS SURGERY N/A 08/08/2020   Procedure: IMAGE GUIDED SINUS SURGERY, RIGHT FRONTAL SINUSOTOMY;  Surgeon: Vernie Murders, MD;  Location: [facility];  Service: ENT;  Laterality: N/A;    LAPAROSCOPIC SLEEVE GASTRECTOMY     MAXILLARY ANTROSTOMY Bilateral 08/08/2020   Procedure: MAXILLARY ANTROSTOMY with tissue;  Surgeon: Vernie Murders, MD;  Location: [facility];  Service: ENT;  Laterality: Bilateral;   NASAL TURBINATE REDUCTION  08/27/2020   Procedure: TURBINATE REDUCTION /SUBMUCOSAL DEBRIDEMENT;  Surgeon: Vernie Murders, MD;  Location: [facility];  Service: ENT;;   TOTAL KNEE ARTHROPLASTY Right 06/2019   TOTAL KNEE ARTHROPLASTY Left 01/31/2021   [facility]    Family History  Problem Relation Age of Onset   Diabetes Mother    High blood pressure Mother    Heart disease Father    Cancer Brother    Mental illness Neg Hx     Social History   Tobacco Use   Smoking status: Never   Smokeless tobacco: Never  Substance Use Topics   Alcohol use: No    Alcohol/week: 0.0 standard drinks of alcohol    Comment: rare     Current Outpatient Medications:    acetaminophen (TYLENOL) 650 MG CR tablet, Take  650-1,300 mg by mouth every 8 (eight) hours as needed for pain., Disp: , Rfl:    apixaban (ELIQUIS) 5 MG TABS tablet, Take 1 tablet (5 mg total) by mouth 2 (two) times daily., Disp: 180 tablet, Rfl: 3   atorvastatin (LIPITOR) 10 MG tablet, TAKE ONE TABLET BY MOUTH ONCE DAILY, Disp: 90 tablet, Rfl: 1   empagliflozin (JARDIANCE) 10 MG TABS tablet, Take 1 tablet (10 mg total) by mouth daily before breakfast., Disp: 30 tablet, Rfl: 5   famotidine (PEPCID) 20 MG tablet, Take 1 tablet (20 mg total) by mouth daily as needed for indigestion., Disp: 90 tablet, Rfl: 1   furosemide (LASIX) 40 MG tablet, Take 1 tablet by mouth once daily., Disp: 90 tablet, Rfl: 0   gabapentin (NEURONTIN) 100 MG capsule, Take 100 mg by mouth at bedtime., Disp: , Rfl:    meclizine (ANTIVERT) 12.5 MG tablet, Take 1 tablet (12.5 mg total) by mouth 3 (three) times daily as needed for dizziness., Disp: 30 tablet, Rfl: 0   metoprolol succinate (TOPROL-XL) 50 MG 24 hr tablet, Take 1.5 tablets  (75 mg total) by mouth in the morning and at bedtime. Take with or immediately following a meal., Disp: 270 tablet, Rfl: 3   Multiple Vitamin (MULTIVITAMIN) tablet, Take 1 tablet by mouth daily. BariatricPal Multivitamin, Disp: , Rfl:    olmesartan (BENICAR) 20 MG tablet, Take 1 tablet (20 mg total) by mouth daily., Disp: 90 tablet, Rfl: 3   pantoprazole (PROTONIX) 40 MG tablet, Take 1 tablet (40 mg total) by mouth daily., Disp: 90 tablet, Rfl: 1   potassium chloride (KLOR-CON M) 10 MEQ tablet, TAKE TWO TABLETS BY MOUTH DAILY, Disp: 180 tablet, Rfl: 0   Semaglutide, 2 MG/DOSE, (OZEMPIC, 2 MG/DOSE,) 8 MG/3ML SOPN, Inject 2 mg into the skin once a week., Disp: 9 mL, Rfl: 1   spironolactone (ALDACTONE) 25 MG tablet, Take 0.5 tablets (12.5 mg total) by mouth daily., Disp: 45 tablet, Rfl: 3   triamcinolone (NASACORT) 55 MCG/ACT AERO nasal inhaler, Place 2 sprays into the nose daily., Disp: , Rfl:    venlafaxine XR (EFFEXOR-XR) 75 MG 24 hr capsule, Take 1 capsule (75 mg total) by mouth daily with breakfast., Disp: 90 capsule, Rfl: 1   Vitamin D, Ergocalciferol, (DRISDOL) 1.25 MG (50000 UNIT) CAPS capsule, TAKE ONE CAPSULE BY MOUTH EVERY 7 DAYS, Disp: 12 capsule, Rfl: 0  Allergies  Allergen Reactions   Hydrocodone-Acetaminophen Other (See Comments)    Extreme headache   Ace Inhibitors Cough   Hctz [Hydrochlorothiazide] Rash    face    I personally reviewed active problem list, medication list, allergies, family history, social history, health maintenance with the patient/caregiver today.   ROS  ***  Objective  There were no vitals filed for this visit.  There is no height or weight on file to calculate BMI.  Physical Exam ***  Recent Results (from the past 2160 hour(s))  Basic Metabolic Panel (BMET)     Status: None   Collection Time: 10/03/22  2:45 PM  Result Value Ref Range   Sodium 138 135 - 145 mmol/L   Potassium 3.6 3.5 - 5.1 mmol/L   Chloride 104 98 - 111 mmol/L   CO2 23 22  - 32 mmol/L   Glucose, Bld 85 70 - 99 mg/dL    Comment: Glucose reference range applies only to samples taken after fasting for at least 8 hours.   BUN 13 8 - 23 mg/dL   Creatinine, Ser 8.65 0.44 -  1.00 mg/dL   Calcium 9.0 8.9 - 16.1 mg/dL   GFR, Estimated >09 >60 mL/min    Comment: (NOTE) Calculated using the CKD-EPI Creatinine Equation (2021)    Anion gap 11 5 - 15    Comment: Performed at [facility], [address] [place], [place] [address]  B Nat Peptide     Status: None   Collection Time: 10/03/22  2:45 PM  Result Value Ref Range   B Natriuretic Peptide 36.0 0.0 - 100.0 pg/mL    Comment: Performed at [facility], [address] [place], [place] [address]    Diabetic Foot Exam: Diabetic Foot Exam - Simple   No data filed    ***  PHQ2/9:    10/28/2022    8:26 AM 09/24/2022    8:39 AM 06/25/2022    8:21 AM 04/25/2022    9:12 AM 04/08/2022    4:15 PM  Depression screen PHQ 2/9  Decreased Interest 0  0  0  Down, Depressed, Hopeless 0  0  0  PHQ - 2 Score 0  0  0  Altered sleeping   0    Tired, decreased energy   2    Change in appetite   0    Feeling bad or failure about yourself    0    Trouble concentrating   0    Moving slowly or fidgety/restless   0    Suicidal thoughts   0    PHQ-9 Score   2       Information is confidential and restricted. Go to Review Flowsheets to unlock data.    phq 9 is {gen pos [id number]}   Fall Risk:    10/28/2022    8:27 AM 06/25/2022    8:20 AM 04/08/2022    4:15 PM 03/19/2022    1:13 PM 03/10/2022    7:51 AM  Fall Risk   Falls in the past year? 0 0 0 0 0  Number falls in past yr: 0 0 0 0 0  Injury with Fall? 0 0  0 0  Risk for fall due to : No Fall Risks No Fall Risks Medication side effect  No Fall Risks  Follow up Falls prevention discussed;Education provided;Falls evaluation completed Falls prevention discussed Falls prevention discussed Falls evaluation completed Falls prevention discussed       Functional Status Survey:      Assessment & Plan  *** There are no diagnoses linked to this encounter.

## 2022-10-29 ENCOUNTER — Ambulatory Visit (INDEPENDENT_AMBULATORY_CARE_PROVIDER_SITE_OTHER): Payer: Medicare HMO | Admitting: Family Medicine

## 2022-10-29 ENCOUNTER — Encounter: Payer: Self-pay | Admitting: Family Medicine

## 2022-10-29 VITALS — BP 126/78 | HR 90 | Resp 16 | Ht 68.0 in | Wt 242.0 lb

## 2022-10-29 DIAGNOSIS — F3342 Major depressive disorder, recurrent, in full remission: Secondary | ICD-10-CM | POA: Diagnosis not present

## 2022-10-29 DIAGNOSIS — Z7985 Long-term (current) use of injectable non-insulin antidiabetic drugs: Secondary | ICD-10-CM | POA: Diagnosis not present

## 2022-10-29 DIAGNOSIS — E1129 Type 2 diabetes mellitus with other diabetic kidney complication: Secondary | ICD-10-CM

## 2022-10-29 DIAGNOSIS — K219 Gastro-esophageal reflux disease without esophagitis: Secondary | ICD-10-CM

## 2022-10-29 DIAGNOSIS — I152 Hypertension secondary to endocrine disorders: Secondary | ICD-10-CM

## 2022-10-29 DIAGNOSIS — R809 Proteinuria, unspecified: Secondary | ICD-10-CM

## 2022-10-29 DIAGNOSIS — E1159 Type 2 diabetes mellitus with other circulatory complications: Secondary | ICD-10-CM | POA: Diagnosis not present

## 2022-10-29 DIAGNOSIS — M25561 Pain in right knee: Secondary | ICD-10-CM | POA: Diagnosis not present

## 2022-10-29 DIAGNOSIS — I5042 Chronic combined systolic (congestive) and diastolic (congestive) heart failure: Secondary | ICD-10-CM

## 2022-10-29 DIAGNOSIS — M19012 Primary osteoarthritis, left shoulder: Secondary | ICD-10-CM | POA: Diagnosis not present

## 2022-10-29 LAB — POCT GLYCOSYLATED HEMOGLOBIN (HGB A1C): Hemoglobin A1C: 5.4 % (ref 4.0–5.6)

## 2022-10-30 ENCOUNTER — Ambulatory Visit: Payer: Medicare HMO | Attending: Cardiology

## 2022-10-30 DIAGNOSIS — I480 Paroxysmal atrial fibrillation: Secondary | ICD-10-CM

## 2022-10-30 DIAGNOSIS — I083 Combined rheumatic disorders of mitral, aortic and tricuspid valves: Secondary | ICD-10-CM | POA: Diagnosis not present

## 2022-10-30 DIAGNOSIS — I5030 Unspecified diastolic (congestive) heart failure: Secondary | ICD-10-CM | POA: Diagnosis not present

## 2022-10-31 ENCOUNTER — Ambulatory Visit (INDEPENDENT_AMBULATORY_CARE_PROVIDER_SITE_OTHER): Payer: Medicare HMO

## 2022-10-31 DIAGNOSIS — E1129 Type 2 diabetes mellitus with other diabetic kidney complication: Secondary | ICD-10-CM

## 2022-10-31 LAB — ECHOCARDIOGRAM COMPLETE
AR max vel: 3.08 cm2
AV Area VTI: 2.89 cm2
AV Area mean vel: 3.08 cm2
AV Mean grad: 3 mmHg
AV Peak grad: 5.4 mmHg
Ao pk vel: 1.17 m/s
Area-P 1/2: 3.37 cm2
Calc EF: 55.2 %
S' Lateral: 3.1 cm
Single Plane A2C EF: 52.7 %
Single Plane A4C EF: 56.4 %

## 2022-10-31 NOTE — Chronic Care Management (AMB) (Signed)
  Chronic Care Management   CCM RN Visit Note  10/31/2022 Name: Regina Christensen MRN: [id number] DOB: Aug 15, 1955  Subjective: Regina Christensen is a 67 y.o. year old female who is a primary care patient of [name], MD. The patient was referred to the Chronic Care Management team for assistance with care management needs subsequent to provider initiation of CCM services and plan of care.    Today's Visit:  Engaged with patient by telephone for follow up visit.       Goals Addressed             This Visit's Progress    COMPLETED: Goal: CCM (Diabetes) Expected Outcome: Monitor, Self-Manage and Reduce Symptoms of Diabetes       Current Barriers:  Chronic Disease Management support and education needs related to Diabetes  Planned Interventions: Reviewed medications and plan for management of Diabetes.  Reports previously working with the [facility] Pharmacist for medication assistance. She will require assistance with injectables for glycemic control. Willing to change to [facility] pharmacist is necessary but prefers to discuss with a new pharmacist. Will message Pharmacy team for further guidance. Reviewed blood glucose readings. Reports fasting readings have been within range.  Denies hypoglycemic or hyperglycemic episodes. Reports improvement with activity tolerance. Advised to engage in low impact activity/walks as tolerated. Remains motivated to improve nutrition and overall health. Currently engaged with [facility]. Reviewed importance of increasing intake of vegetables and including lean proteins. Reports continues improvement with nutritional intake.  Lab Results  Component Value Date   HGBA1C 6.1 (A) 06/25/2022      Symptom Management: Take medications as prescribed   Attend all scheduled provider appointments Call pharmacy for medication refills 3-7 days in advance of running out of medications Check fasting blood sugars and record  readings Keep appointment with eye doctor Check feet daily for cuts, sores or redness Read food labels for fat, fiber, carbohydrates and portion size Call provider office for new concerns or questions    Follow Up Plan:  Will follow up next month              PLAN: Will follow up within the next month.   [facility]/Chronic Care Management [phone]

## 2022-11-03 ENCOUNTER — Encounter (INDEPENDENT_AMBULATORY_CARE_PROVIDER_SITE_OTHER): Payer: Self-pay | Admitting: Adult Health

## 2022-11-03 ENCOUNTER — Ambulatory Visit (INDEPENDENT_AMBULATORY_CARE_PROVIDER_SITE_OTHER): Payer: Medicare HMO | Admitting: Adult Health

## 2022-11-03 DIAGNOSIS — E66813 Obesity, class 3: Secondary | ICD-10-CM

## 2022-11-03 DIAGNOSIS — E1159 Type 2 diabetes mellitus with other circulatory complications: Secondary | ICD-10-CM | POA: Diagnosis not present

## 2022-11-03 DIAGNOSIS — I152 Hypertension secondary to endocrine disorders: Secondary | ICD-10-CM | POA: Diagnosis not present

## 2022-11-03 DIAGNOSIS — I5020 Unspecified systolic (congestive) heart failure: Secondary | ICD-10-CM

## 2022-11-03 DIAGNOSIS — E669 Obesity, unspecified: Secondary | ICD-10-CM | POA: Diagnosis not present

## 2022-11-03 DIAGNOSIS — Z6836 Body mass index (BMI) 36.0-36.9, adult: Secondary | ICD-10-CM

## 2022-11-03 NOTE — Progress Notes (Signed)
WEIGHT SUMMARY AND BIOMETRICS  Vitals Temp: 97.6 F (36.4 C) BP: 119/78 Pulse Rate: 79 SpO2: 99 %   Anthropometric Measurements Height: 5\' 8"  (1.727 m) Weight: 238 lb (108 kg) BMI (Calculated): 36.2 Weight at Last Visit: 247lb Weight Lost Since Last Visit: 9lb Weight Gained Since Last Visit: 0 Starting Weight: 277lb Total Weight Loss (lbs): 39 lb (17.7 kg)   Body Composition  Body Fat %: 47.3 % Fat Mass (lbs): 112.6 lbs Muscle Mass (lbs): 119.2 lbs Total Body Water (lbs): 91 lbs Visceral Fat Rating : 14   Other Clinical Data Fasting: Yes Labs: No Today's Visit #: 25 Starting Date: 05/10/20    Chief Complaint:   OBESITY Sharnise is here to discuss her progress with her obesity treatment plan. She is on the the Category 1 Plan and states she is following her eating plan approximately 80 % of the time. She states she is exercising Walking 20-30 minutes 3 times per week.   Interim History:  Ms. Demary reports increase in chronic R knee pain sx's. She is currently followed by [facility]  She estimates to drink 40-60 oz water/day  Reviewed Bioimpedance results with pt: Reduced Visceral Adipose Rating from 15 to 14  Lab Results  Component Value Date   HGBA1C 5.4 10/29/2022   HGBA1C 6.1 (A) 06/25/2022   HGBA1C 6.0 (H) 02/10/2022    Subjective:   1. HFrEF (heart failure with reduced ejection fraction) (HCC) 09/23/2022 Heart Failure OV Finish your olmesartan. When you finish this, you will start taking entresto as 1 tablet in the morning and 1 tablet in the evening.      UNABLE TO TOLERATE ENTRESTO due to palpitations- lat dose on/about 10/13/2022  Restarted back on Olmesartan 20mg   Continued on all other cardiac medications  2. Hypertension associated with type 2 diabetes mellitus (HCC) BP at goal at OV Recently restarted Benicar.  No chest pain , SOB or palpitation  She has been compliant with low salt diet and medications  Currently  regime: spironolactone (ALDACTONE) 25 MG tablet  apixaban (ELIQUIS) 5 MG TABS tablet  empagliflozin (JARDIANCE) 10 MG TABS tablet  Semaglutide, 2 MG/DOSE, (OZEMPIC, 2 MG/DOSE,) 8 MG/3ML SOPN  atorvastatin (LIPITOR) 10 MG tablet  metoprolol succinate (TOPROL-XL) 50 MG 24 hr tablet  furosemide (LASIX) 40 MG tablet  olmesartan (BENICAR) 20 MG tablet    Assessment/Plan:   1. HFrEF (heart failure with reduced ejection fraction) (HCC) Continue with weight loss efforts and medication per Cards  2. Hypertension associated with type 2 diabetes mellitus (HCC) Continue spironolactone (ALDACTONE) 25 MG tablet  apixaban (ELIQUIS) 5 MG TABS tablet  empagliflozin (JARDIANCE) 10 MG TABS tablet  Semaglutide, 2 MG/DOSE, (OZEMPIC, 2 MG/DOSE,) 8 MG/3ML SOPN  atorvastatin (LIPITOR) 10 MG tablet  metoprolol succinate (TOPROL-XL) 50 MG 24 hr tablet  furosemide (LASIX) 40 MG tablet  olmesartan (BENICAR) 20 MG tablet   3. Obesity, current BMI 36.2 Everlynn is currently in the action stage of change. As such, her goal is to continue with weight loss efforts. She has agreed to the Category 1 Plan.   Exercise goals: Older adults should follow the adult guidelines. When older adults cannot meet the adult guidelines, they should be as physically active as their abilities and conditions will allow.   Behavioral modification strategies: increasing lean protein intake, decreasing simple carbohydrates, increasing vegetables, increasing water intake, meal planning and cooking strategies, and planning for success.  Man has agreed to follow-up with our clinic in 6  weeks. She was informed of the importance of frequent follow-up visits to maximize her success with intensive lifestyle modifications for her multiple health conditions.   Objective:   Blood pressure 119/78, pulse 79, temperature 97.6 F (36.4 C), height 5\' 8"  (1.727 m), weight 238 lb (108 kg), SpO2 99%. Body mass index is 36.19  kg/m.  General: Cooperative, alert, well developed, in no acute distress. HEENT: Conjunctivae and lids unremarkable. Cardiovascular: Regular rhythm.  Lungs: Normal work of breathing. Neurologic: No focal deficits.   Lab Results  Component Value Date   CREATININE 0.79 10/03/2022   BUN 13 10/03/2022   NA 138 10/03/2022   K 3.6 10/03/2022   CL 104 10/03/2022   CO2 23 10/03/2022   Lab Results  Component Value Date   ALT 75 (H) 03/02/2022   AST 48 (H) 03/02/2022   ALKPHOS 83 03/02/2022   BILITOT 1.1 03/02/2022   Lab Results  Component Value Date   HGBA1C 5.4 10/29/2022   HGBA1C 6.1 (A) 06/25/2022   HGBA1C 6.0 (H) 02/10/2022   HGBA1C 5.8 (H) 08/28/2021   HGBA1C 5.6 03/13/2021   Lab Results  Component Value Date   INSULIN 8.0 08/28/2021   Lab Results  Component Value Date   TSH 1.359 03/02/2022   Lab Results  Component Value Date   CHOL 112 02/10/2022   HDL 66 02/10/2022   LDLCALC 30 02/10/2022   TRIG 79 02/10/2022   CHOLHDL 1.7 02/10/2022   Lab Results  Component Value Date   VD25OH 60 02/10/2022   VD25OH 38.1 08/28/2021   VD25OH 37.3 11/14/2020   Lab Results  Component Value Date   WBC 7.0 05/14/2022   HGB 14.6 05/14/2022   HCT 44.9 05/14/2022   MCV 89.6 05/14/2022   PLT 291 05/14/2022   Lab Results  Component Value Date   IRON 39 11/14/2020   TIBC 329 11/14/2020   FERRITIN 25 11/14/2020    Attestation Statements:   Reviewed by clinician on day of visit: allergies, medications, problem list, medical history, surgical history, family history, social history, and previous encounter notes.  Time spent on visit including pre-visit chart review and post-visit care and charting was 28 minutes.   I have reviewed the above documentation for accuracy and completeness, and I agree with the above. -  Alisa Stjames d. Tallen Schnorr, NP-C

## 2022-11-16 ENCOUNTER — Other Ambulatory Visit: Payer: Self-pay | Admitting: Family

## 2022-11-17 DIAGNOSIS — M19012 Primary osteoarthritis, left shoulder: Secondary | ICD-10-CM | POA: Diagnosis not present

## 2022-11-17 DIAGNOSIS — M25561 Pain in right knee: Secondary | ICD-10-CM | POA: Diagnosis not present

## 2022-11-17 NOTE — Progress Notes (Unsigned)
11/18/2022 9:21 AM   Regina Christensen July 04, 1955 [id number]  Referring provider: [name], MD [address] [place],  [place] [address]  Urological history: 1.  High risk hematuria -non -smoker -CTU (04/2022) - no GU malignancies -cysto (04/2022) - NED   2.  Nephrolithiasis -CTU (04/2022) - 3 mm non obstructing stone lower pole of right kidney  3. Renal cysts -CTU (04/2022) -simple cyst in left kidney with bilateral proteinaceous or hemorrhagic cysts in both kidneys    No chief complaint on file.   HPI: Regina Christensen is a 67 y.o. female who presents today for 6 month follow up.    Previous records reviewed.  She had findings of an abnormal appendiceal diameter and a perirectal nodule for which a follow-up CT of the pelvis was recommended in 3 months for surveillance.  She was also seen by her general surgeon and they are agreement.  The CT scan has not been completed as of this visit.  UA ***  PVR ***  PMH: Past Medical History:  Diagnosis Date   Anxiety    Arthritis    Back pain    BPPV (benign paroxysmal positional vertigo)    CHF (congestive heart failure) (HCC)    Chronic diastolic HF (heart failure) (HCC)    Complication of anesthesia    Post-operative hypoxia requiring supplemental oxygen   Current use of long term anticoagulation    Apixaban   CVA (cerebral vascular accident) (HCC) 02/28/2020   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; imaging from [facility]   Depression    Edema of both lower extremities    GERD (gastroesophageal reflux disease)    Hypertension    IBS (irritable bowel syndrome)    Lactose intolerance    Morbid obesity with BMI of 45.0-49.9, adult (HCC)    Obesity    Osteoarthritis    PAF (paroxysmal atrial fibrillation) (HCC)    Pre-diabetes    Sleep apnea    uses CPAP machine sometimes    Type 2 diabetes mellitus without complication (HCC)    Vitamin D deficiency     Surgical History: Past Surgical  History:  Procedure Laterality Date   ABDOMINAL HYSTERECTOMY     ATRIAL FIBRILLATION ABLATION N/A 05/29/2022   Procedure: ATRIAL FIBRILLATION ABLATION;  Surgeon: Lanier Prude, MD;  [facility]: [facility];  Service: Cardiovascular;  Laterality: N/A;   BREAST EXCISIONAL BIOPSY Left    CARDIOVERSION N/A 03/05/2022   Procedure: CARDIOVERSION;  Surgeon: Debbe Odea, MD;  Location: [facility] ORS;  Service: Cardiovascular;  Laterality: N/A;   ENDOMETRIAL ABLATION     x2   ENDOSCOPIC CONCHA BULLOSA RESECTION Bilateral 08/08/2020   Procedure: ENDOSCOPIC CONCHA BULLOSA RESECTION;  Surgeon: Vernie Murders, MD;  Location: [facility] ORS;  Service: ENT;  Laterality: Bilateral;   ETHMOIDECTOMY Right 08/08/2020   Procedure: ETHMOIDECTOMY, LEFT PARTIAL ETHMOIDECTOMY;  Surgeon: Vernie Murders, MD;  Location: [facility] ORS;  Service: ENT;  Laterality: Right;   IMAGE GUIDED SINUS SURGERY N/A 08/08/2020   Procedure: IMAGE GUIDED SINUS SURGERY, RIGHT FRONTAL SINUSOTOMY;  Surgeon: Vernie Murders, MD;  Location: [facility] ORS;  Service: ENT;  Laterality: N/A;   LAPAROSCOPIC SLEEVE GASTRECTOMY     MAXILLARY ANTROSTOMY Bilateral 08/08/2020   Procedure: MAXILLARY ANTROSTOMY with tissue;  Surgeon: Vernie Murders, MD;  Location: [facility] ORS;  Service: ENT;  Laterality: Bilateral;   NASAL TURBINATE REDUCTION  08/27/2020   Procedure: TURBINATE REDUCTION /SUBMUCOSAL DEBRIDEMENT;  Surgeon: Vernie Murders, MD;  Location: [facility]  ORS;  Service: ENT;;   TOTAL KNEE ARTHROPLASTY Right 06/2019   TOTAL KNEE ARTHROPLASTY Left 01/31/2021   [facility] Medications:  Allergies as of 11/18/2022       Reactions   Hydrocodone-acetaminophen Other (See Comments)   Extreme headache   Ace Inhibitors Cough   Hctz [hydrochlorothiazide] Rash   face        Medication List        Accurate as of November 17, 2022  9:21 AM. If you have any questions, ask your nurse or doctor.          acetaminophen 650 MG CR  tablet Commonly known as: TYLENOL Take 650-1,300 mg by mouth every 8 (eight) hours as needed for pain.   apixaban 5 MG Tabs tablet Commonly known as: Eliquis Take 1 tablet (5 mg total) by mouth 2 (two) times daily.   atorvastatin 10 MG tablet Commonly known as: LIPITOR TAKE ONE TABLET BY MOUTH ONCE DAILY   empagliflozin 10 MG Tabs tablet Commonly known as: Jardiance Take 1 tablet (10 mg total) by mouth daily before breakfast.   famotidine 20 MG tablet Commonly known as: PEPCID Take 1 tablet (20 mg total) by mouth daily as needed for indigestion.   furosemide 40 MG tablet Commonly known as: LASIX Take 1 tablet by mouth once daily.   gabapentin 100 MG capsule Commonly known as: NEURONTIN Take 100 mg by mouth at bedtime.   meclizine 12.5 MG tablet Commonly known as: ANTIVERT Take 1 tablet (12.5 mg total) by mouth 3 (three) times daily as needed for dizziness.   metoprolol succinate 50 MG 24 hr tablet Commonly known as: TOPROL-XL Take 1.5 tablets (75 mg total) by mouth in the morning and at bedtime. Take with or immediately following a meal.   multivitamin tablet Take 1 tablet by mouth daily. BariatricPal Multivitamin   olmesartan 20 MG tablet Commonly known as: BENICAR Take 1 tablet (20 mg total) by mouth daily.   Ozempic (2 MG/DOSE) 8 MG/3ML Sopn Generic drug: Semaglutide (2 MG/DOSE) Inject 2 mg into the skin once a week.   pantoprazole 40 MG tablet Commonly known as: Protonix Take 1 tablet (40 mg total) by mouth daily.   potassium chloride 10 MEQ tablet Commonly known as: KLOR-CON M TAKE TWO TABLETS BY MOUTH DAILY   spironolactone 25 MG tablet Commonly known as: ALDACTONE Take 0.5 tablets (12.5 mg total) by mouth daily.   triamcinolone 55 MCG/ACT Aero nasal inhaler Commonly known as: NASACORT Place 2 sprays into the nose daily.   venlafaxine XR 75 MG 24 hr capsule Commonly known as: EFFEXOR-XR Take 1 capsule (75 mg total) by mouth daily with  breakfast.   Vitamin D (Ergocalciferol) 1.25 MG (50000 UNIT) Caps capsule Commonly known as: DRISDOL TAKE ONE CAPSULE BY MOUTH EVERY 7 DAYS        Allergies:  Allergies  Allergen Reactions   Hydrocodone-Acetaminophen Other (See Comments)    Extreme headache   Ace Inhibitors Cough   Hctz [Hydrochlorothiazide] Rash    face    Family History: Family History  Problem Relation Age of Onset   Diabetes Mother    High blood pressure Mother    Heart disease Father    Cancer Brother    Mental illness Neg Hx     Social History:  reports that she has never smoked. She has never used smokeless tobacco. She reports that she does not drink alcohol and does not use drugs.  ROS:  Pertinent ROS in HPI  Physical Exam: There were no vitals taken for this visit.  Constitutional:  Well nourished. Alert and oriented, No acute distress. HEENT: [place] AT, moist mucus membranes.  Trachea midline, no masses. Cardiovascular: No clubbing, cyanosis, or edema. Respiratory: Normal respiratory effort, no increased work of breathing. GU: No CVA tenderness.  No bladder fullness or masses. Vulvovaginal atrophy w/ pallor, loss of rugae, introital retraction, excoriations.  Vulvar thinning, fusion of labia, clitoral hood retraction, prominent urethral meatus.   *** external genitalia, *** pubic hair distribution, no lesions.  Normal urethral meatus, no lesions, no prolapse, no discharge.   No urethral masses, tenderness and/or tenderness. No bladder fullness, tenderness or masses. *** vagina mucosa, *** estrogen effect, no discharge, no lesions, *** pelvic support, *** cystocele and *** rectocele noted.  No cervical motion tenderness.  Uterus is freely mobile and non-fixed.  No adnexal/parametria masses or tenderness noted.  Anus and perineum are without rashes or lesions.   ***  Neurologic: Grossly intact, no focal deficits, moving all 4 extremities. Psychiatric: Normal mood and affect.    Laboratory Data: Lab  Results  Component Value Date   WBC 7.0 05/14/2022   HGB 14.6 05/14/2022   HCT 44.9 05/14/2022   MCV 89.6 05/14/2022   PLT 291 05/14/2022    Lab Results  Component Value Date   CREATININE 0.79 10/03/2022    Lab Results  Component Value Date   HGBA1C 5.4 10/29/2022       Component Value Date/Time   CHOL 112 02/10/2022 1209   CHOL 151 12/05/2014 1117   HDL 66 02/10/2022 1209   HDL 74 12/05/2014 1117   CHOLHDL 1.7 02/10/2022 1209   VLDL 14 03/01/2020 0306   LDLCALC 30 02/10/2022 1209    Lab Results  Component Value Date   AST 48 (H) 03/02/2022   Lab Results  Component Value Date   ALT 75 (H) 03/02/2022    Urinalysis *** I have reviewed the labs.   Pertinent Imaging: N/A  Assessment & Plan:  ***  1.  High risk hematuria -Non-smoker -Workup in January 2024-no GU malignancies noted -No reports of gross hematuria -UA ***  2. Abnormal findings on CT -Encouraged patient to reach out to her general surgeon to have a follow-up CT of this performed -Advised her that we will need to consider these findings as a possible malignancies until we can document stability   No follow-ups on file.  These notes generated with voice recognition software. I apologize for typographical errors.  Cloretta Ned  [facility] [address]  [address] [place], [place] [address] [phone]

## 2022-11-18 ENCOUNTER — Encounter: Payer: Self-pay | Admitting: Urology

## 2022-11-18 ENCOUNTER — Ambulatory Visit: Payer: Medicare HMO | Admitting: Urology

## 2022-11-18 VITALS — BP 131/83 | HR 93 | Ht 68.0 in | Wt 240.0 lb

## 2022-11-18 DIAGNOSIS — R935 Abnormal findings on diagnostic imaging of other abdominal regions, including retroperitoneum: Secondary | ICD-10-CM | POA: Diagnosis not present

## 2022-11-18 DIAGNOSIS — R319 Hematuria, unspecified: Secondary | ICD-10-CM

## 2022-11-18 DIAGNOSIS — R3129 Other microscopic hematuria: Secondary | ICD-10-CM

## 2022-11-18 LAB — URINALYSIS, COMPLETE
Bilirubin, UA: NEGATIVE
Ketones, UA: NEGATIVE
Leukocytes,UA: NEGATIVE
Nitrite, UA: NEGATIVE
Specific Gravity, UA: 1.015 (ref 1.005–1.030)
Urobilinogen, Ur: 0.2 mg/dL (ref 0.2–1.0)
pH, UA: 7 (ref 5.0–7.5)

## 2022-11-18 LAB — MICROSCOPIC EXAMINATION

## 2022-11-18 LAB — BLADDER SCAN AMB NON-IMAGING: Scan Result: 76

## 2022-11-18 NOTE — Patient Instructions (Signed)
Campbell Lerner, MD Address: [address], [place], [place] [address] Phone: [phone]

## 2022-11-19 DIAGNOSIS — E1129 Type 2 diabetes mellitus with other diabetic kidney complication: Secondary | ICD-10-CM

## 2022-11-19 DIAGNOSIS — R809 Proteinuria, unspecified: Secondary | ICD-10-CM

## 2022-11-24 NOTE — Chronic Care Management (AMB) (Signed)
Error Please Disregard

## 2022-11-26 ENCOUNTER — Encounter: Payer: Self-pay | Admitting: Pharmacist

## 2022-11-26 ENCOUNTER — Other Ambulatory Visit: Payer: Self-pay | Admitting: Family Medicine

## 2022-11-26 ENCOUNTER — Encounter: Payer: Self-pay | Admitting: Family Medicine

## 2022-11-26 ENCOUNTER — Other Ambulatory Visit: Payer: Medicare HMO | Admitting: Pharmacist

## 2022-11-26 DIAGNOSIS — I5042 Chronic combined systolic (congestive) and diastolic (congestive) heart failure: Secondary | ICD-10-CM

## 2022-11-26 DIAGNOSIS — E1159 Type 2 diabetes mellitus with other circulatory complications: Secondary | ICD-10-CM

## 2022-11-26 DIAGNOSIS — E785 Hyperlipidemia, unspecified: Secondary | ICD-10-CM

## 2022-11-26 NOTE — Patient Instructions (Signed)
Goals Addressed             This Visit's Progress    Pharmacy Goals       More information regarding the [employer] Patient Assistance program for Eliquis can be found at:   http://rich.org/   Please note that this program requires you to have spent 3% of your annual household income on out-of-pocket prescription expenses for the year in which you are seeking assistance. Your pharmacy can provide this report.   Our next telephone call is scheduled for 01/07/2023 at 3:00 PM    Please give me a call if needed sooner if you have questions or feel like you have met the out of pocket requirement for the above program prior to that time.   Thank you!   Estelle Grumbles, PharmD, [facility] [phone]

## 2022-11-26 NOTE — Progress Notes (Addendum)
11/26/2022 Name: Regina Christensen MRN: [id number] DOB: 1956-01-16  Chief Complaint  Patient presents with   Medication Assistance    Regina Christensen is a 67 y.o. year old female who was referred to the pharmacist by their PCP for assistance in managing medication access.   Receive message from PCP today requesting outreach to patient as she reports difficulty with cost of her Jardiance  Unable to complete medication review today  Subjective:  Care Team: Primary Care Provider: [name], MD ; Next Scheduled Visit: 03/03/2023 Cardiologist: Debbe Odea, MD ; Next Scheduled Visit: 11/28/2022 Heart Failure Specialist: Delma Freeze, FNP; Next Scheduled Visit: 12/25/2022 Urologist: Hulan Fray  Medication Access/Adherence  Current Pharmacy:  [facility] - [place], [place] - [address] [place] [address] Phone: [phone] Fax: [phone]  [id number] - [place], [place] - [address] [place] [address] Phone: [phone] Fax: [phone]  [facility] [address] [place] [address] Phone: [phone] Fax: [phone]   Patient reports affordability concerns with their medications: Yes  Patient reports access/transportation concerns to their pharmacy: No  Patient reports adherence concerns with their medications:  No    Today patient reports that she has entered the coverage gap of her Medicare prescription coverage and some of her medications are now difficult to afford, particularly Jardiance and Eliquis  Patient aware of importance of continuing to take her current medications, but would like an assistance with cost that is available  Reports that she is currently enrolled in patient assistance program for Ozempic from [employer] through 04/21/2023  Objective:  Lab Results  Component Value Date   HGBA1C 5.4  10/29/2022    Lab Results  Component Value Date   CREATININE 0.79 10/03/2022   BUN 13 10/03/2022   NA 138 10/03/2022   K 3.6 10/03/2022   CL 104 10/03/2022   CO2 23 10/03/2022    Lab Results  Component Value Date   CHOL 112 02/10/2022   HDL 66 02/10/2022   LDLCALC 30 02/10/2022   TRIG 79 02/10/2022   CHOLHDL 1.7 02/10/2022    Medications Reviewed Today     Reviewed by Manuela Neptune, RPH-CPP (Pharmacist) on 11/26/22 at 1702  Med List Status: <None>   Medication Order Taking? Sig Documenting Provider Last Dose Status Informant  acetaminophen (TYLENOL) 650 MG CR tablet [id number] No Take 650-1,300 mg by mouth every 8 (eight) hours as needed for pain. [provider] Taking Active Self  apixaban (ELIQUIS) 5 MG TABS tablet [id number] No Take 1 tablet (5 mg total) by mouth 2 (two) times daily. Debbe Odea, MD Taking Active   atorvastatin (LIPITOR) 10 MG tablet [id number] No TAKE ONE TABLET BY MOUTH ONCE DAILY [name], MD Taking Active   famotidine (PEPCID) 20 MG tablet [id number] No Take 1 tablet (20 mg total) by mouth daily as needed for indigestion. [name], MD Taking Active   furosemide (LASIX) 40 MG tablet [id number] No Take 1 tablet by mouth once daily. [name], MD Taking Active   gabapentin (NEURONTIN) 100 MG capsule [id number] No Take 100 mg by mouth at bedtime. [provider] Taking Active Self           Med Note Dollene Primrose   Wed Jun 25, 2022  8:20 AM)    JARDIANCE 10 MG TABS tablet [id number] No TAKE ONE TABLET BY MOUTH BEFORE  BREAKFAST daily Delma Freeze, FNP Taking Active   meclizine (ANTIVERT) 12.5 MG tablet [id number] No Take 1 tablet (12.5 mg total) by mouth 3 (three) times daily as needed for dizziness. [name], MD Taking Active   metoprolol succinate (TOPROL-XL) 50 MG 24 hr tablet [id number] No Take 1.5 tablets (75 mg total) by mouth in the morning and at bedtime. Take with or immediately following a meal.  Lanier Prude, MD Taking Active   Multiple Vitamin (MULTIVITAMIN) tablet [id number] No Take 1 tablet by mouth daily. BariatricPal Multivitamin [provider] Taking Active Self  olmesartan (BENICAR) 20 MG tablet [id number] No Take 1 tablet (20 mg total) by mouth daily. Debbe Odea, MD Taking Active   pantoprazole (PROTONIX) 40 MG tablet [id number] No Take 1 tablet (40 mg total) by mouth daily. [name], MD Taking Active   potassium chloride (KLOR-CON M) 10 MEQ tablet [id number] No TAKE TWO TABLETS BY MOUTH DAILY Carlynn Purl, Danna Hefty, MD Taking Active   Semaglutide, 2 MG/DOSE, (OZEMPIC, 2 MG/DOSE,) 8 MG/3ML SOPN [id number] No Inject 2 mg into the skin once a week. [name], MD Taking Active   spironolactone (ALDACTONE) 25 MG tablet [id number] No Take 0.5 tablets (12.5 mg total) by mouth daily. Delma Freeze, FNP Taking Active Self  triamcinolone (NASACORT) 55 MCG/ACT AERO nasal inhaler [id number] No Place 2 sprays into the nose daily. [provider] Taking Active Self  venlafaxine XR (EFFEXOR-XR) 75 MG 24 hr capsule [id number] No Take 1 capsule (75 mg total) by mouth daily with breakfast. Jomarie Longs, MD Taking Active   Vitamin D, Ergocalciferol, (DRISDOL) 1.25 MG (50000 UNIT) CAPS capsule [id number] No TAKE ONE CAPSULE BY MOUTH EVERY 7 DAYS Sowles, Danna Hefty, MD Taking Active               Assessment/Plan:   Unable to complete medication review today; agree to complete this during our next telephone appointment  Based on reported income, patient does not meet criteria for patient assistance for Jardiance from manufacturer.  From review of patient's chart, note patient has diagnosis of Cardiomyopathy. Note patient meets requirements for [facility] Cardiomyopathy Medicare Access grant, which is currently open Assist patient with enrolling in [facility] Cardiomyopathy- Medicare Access grant - patient pre-approved  Send patient  [employer] grant billing information via MyChart and provide counseling on using this information at her pharmacy, including importance of having pharmacy bill her Medicare prescription coverage prior to billing grant.  Based on reported income, patient does meet income limit for patient assistance program for Eliquis from manufacturer, but she is not sure if she has met the 3% out of pocket spend requirement     Follow Up Plan:   1) Clinical Pharmacist will follow up with patient by telephone on 01/07/2023 at 3:00 PM   2) Patient to follow up with her pharmacy to determine current out of pocket spend amount since beginning of calendar year and contact Clinical Pharmacist if has already or when is close to meeting the out of pocket spend requirement for Eliquis patient assistance program  Estelle Grumbles, PharmD, [facility] [phone]

## 2022-11-27 ENCOUNTER — Ambulatory Visit: Payer: Medicare HMO | Admitting: Surgery

## 2022-11-28 ENCOUNTER — Ambulatory Visit: Payer: Medicare HMO | Attending: Cardiology | Admitting: Cardiology

## 2022-11-28 ENCOUNTER — Encounter: Payer: Self-pay | Admitting: Cardiology

## 2022-11-28 ENCOUNTER — Other Ambulatory Visit: Payer: Self-pay

## 2022-11-28 VITALS — BP 144/88 | HR 84 | Ht 68.0 in | Wt 239.0 lb

## 2022-11-28 DIAGNOSIS — I5020 Unspecified systolic (congestive) heart failure: Secondary | ICD-10-CM | POA: Diagnosis not present

## 2022-11-28 DIAGNOSIS — Z6836 Body mass index (BMI) 36.0-36.9, adult: Secondary | ICD-10-CM | POA: Diagnosis not present

## 2022-11-28 DIAGNOSIS — I480 Paroxysmal atrial fibrillation: Secondary | ICD-10-CM

## 2022-11-28 DIAGNOSIS — I10 Essential (primary) hypertension: Secondary | ICD-10-CM | POA: Diagnosis not present

## 2022-11-28 NOTE — Progress Notes (Addendum)
Cardiology Office Note:    Date:  11/28/2022   ID:  GRACEE TAFF, DOB 1956-04-19, MRN [id number]  PCP:  [name], MD  Cardiologist:  Debbe Odea, MD  Electrophysiologist:  Lanier Prude, MD   Referring MD: [name], MD   Chief Complaint  Patient presents with   Follow-up    Patient denies new or acute cardiac problems/concerns today.      History of Present Illness:    Regina Christensen is a 67 y.o. female with a hx of hypertension, paroxysmal A. fib s/p ablation 05/2022, CVA 02/2020 (2/2 not taking eliquis), HFrEF, normalized EF, obesity, sleep apnea who presents for follow-up.  Previous echocardiogram 02/2022 showed mild to moderately reduced EF.  Etiology deemed possibly tachycardia induced.  Medications were adjusted, repeat echocardiogram obtained last month with improved ejection fraction.  Denies any bleeding issues with Eliquis.  Denies any significant palpitations.  Was previously placed on Entresto, did not tolerate due to palpitations.  Benicar restarted.  States losing some weight with being on Ozempic.    Prior notes Echo 02/2022 EF 40 to 45% Echocardiogram 02/2020 EF 55 to 60%, mildly dilated left atrium Did not tolerate Entresto in the past, cause palpitations.  Past Medical History:  Diagnosis Date   Anxiety    Arthritis    Back pain    BPPV (benign paroxysmal positional vertigo)    CHF (congestive heart failure) (HCC)    Chronic diastolic HF (heart failure) (HCC)    Complication of anesthesia    Post-operative hypoxia requiring supplemental oxygen   Current use of long term anticoagulation    Apixaban   CVA (cerebral vascular accident) (HCC) 02/28/2020   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; imaging from [facility]   Depression    Edema of both lower extremities    GERD (gastroesophageal reflux disease)    Hypertension    IBS (irritable bowel syndrome)    Lactose intolerance    Morbid obesity with BMI of 45.0-49.9, adult  (HCC)    Obesity    Osteoarthritis    PAF (paroxysmal atrial fibrillation) (HCC)    Pre-diabetes    Sleep apnea    uses CPAP machine sometimes    Type 2 diabetes mellitus without complication (HCC)    Vitamin D deficiency     Past Surgical History:  Procedure Laterality Date   ABDOMINAL HYSTERECTOMY     ATRIAL FIBRILLATION ABLATION N/A 05/29/2022   Procedure: ATRIAL FIBRILLATION ABLATION;  Surgeon: Lanier Prude, MD;  Location: [facility];  Service: Cardiovascular;  Laterality: N/A;   BREAST EXCISIONAL BIOPSY Left    CARDIOVERSION N/A 03/05/2022   Procedure: CARDIOVERSION;  Surgeon: Debbe Odea, MD;  Location: [facility];  Service: Cardiovascular;  Laterality: N/A;   ENDOMETRIAL ABLATION     x2   ENDOSCOPIC CONCHA BULLOSA RESECTION Bilateral 08/08/2020   Procedure: ENDOSCOPIC CONCHA BULLOSA RESECTION;  Surgeon: Vernie Murders, MD;  Location: [facility];  Service: ENT;  Laterality: Bilateral;   ETHMOIDECTOMY Right 08/08/2020   Procedure: ETHMOIDECTOMY, LEFT PARTIAL ETHMOIDECTOMY;  Surgeon: Vernie Murders, MD;  Location: [facility];  Service: ENT;  Laterality: Right;   IMAGE GUIDED SINUS SURGERY N/A 08/08/2020   Procedure: IMAGE GUIDED SINUS SURGERY, RIGHT FRONTAL SINUSOTOMY;  Surgeon: Vernie Murders, MD;  Location: [facility];  Service: ENT;  Laterality: N/A;   LAPAROSCOPIC SLEEVE GASTRECTOMY     MAXILLARY ANTROSTOMY Bilateral 08/08/2020   Procedure: MAXILLARY ANTROSTOMY with tissue;  Surgeon: Vernie Murders, MD;  Location:  [facility];  Service: ENT;  Laterality: Bilateral;   NASAL TURBINATE REDUCTION  08/27/2020   Procedure: TURBINATE REDUCTION /SUBMUCOSAL DEBRIDEMENT;  Surgeon: Vernie Murders, MD;  Location: [facility];  Service: ENT;;   TOTAL KNEE ARTHROPLASTY Right 06/2019   TOTAL KNEE ARTHROPLASTY Left 01/31/2021   [facility]    Current Medications: Current Meds  Medication Sig   acetaminophen (TYLENOL) 650 MG CR tablet Take 650-1,300 mg by mouth  every 8 (eight) hours as needed for pain.   apixaban (ELIQUIS) 5 MG TABS tablet Take 1 tablet (5 mg total) by mouth 2 (two) times daily.   atorvastatin (LIPITOR) 10 MG tablet TAKE ONE TABLET BY MOUTH ONCE DAILY   famotidine (PEPCID) 20 MG tablet Take 1 tablet (20 mg total) by mouth daily as needed for indigestion.   furosemide (LASIX) 40 MG tablet Take 1 tablet by mouth once daily.   gabapentin (NEURONTIN) 100 MG capsule Take 100 mg by mouth at bedtime.   JARDIANCE 10 MG TABS tablet TAKE ONE TABLET BY MOUTH BEFORE BREAKFAST daily   meclizine (ANTIVERT) 12.5 MG tablet Take 1 tablet (12.5 mg total) by mouth 3 (three) times daily as needed for dizziness.   metoprolol succinate (TOPROL-XL) 50 MG 24 hr tablet Take 1.5 tablets (75 mg total) by mouth in the morning and at bedtime. Take with or immediately following a meal.   Multiple Vitamin (MULTIVITAMIN) tablet Take 1 tablet by mouth daily. BariatricPal Multivitamin   olmesartan (BENICAR) 20 MG tablet Take 1 tablet (20 mg total) by mouth daily.   pantoprazole (PROTONIX) 40 MG tablet Take 1 tablet (40 mg total) by mouth daily.   potassium chloride (KLOR-CON M) 10 MEQ tablet TAKE TWO TABLETS BY MOUTH DAILY   Semaglutide, 2 MG/DOSE, (OZEMPIC, 2 MG/DOSE,) 8 MG/3ML SOPN Inject 2 mg into the skin once a week.   spironolactone (ALDACTONE) 25 MG tablet Take 0.5 tablets (12.5 mg total) by mouth daily.   triamcinolone (NASACORT) 55 MCG/ACT AERO nasal inhaler Place 2 sprays into the nose daily.   venlafaxine XR (EFFEXOR-XR) 75 MG 24 hr capsule Take 1 capsule (75 mg total) by mouth daily with breakfast.   Vitamin D, Ergocalciferol, (DRISDOL) 1.25 MG (50000 UNIT) CAPS capsule TAKE ONE CAPSULE BY MOUTH EVERY 7 DAYS     Allergies:   Hydrocodone-acetaminophen, Ace inhibitors, and Hctz [hydrochlorothiazide]   Social History   Socioeconomic History   Marital status: Divorced    Spouse name: Not on file   Number of children: 2   Years of education: Not on file    Highest education level: Associate degree: occupational, Scientist, product/process development, or vocational program  Occupational History   Occupation: Retired/ work PT    Employer: [employer]  Tobacco Use   Smoking status: Never   Smokeless tobacco: Never  Vaping Use   Vaping status: Never Used  Substance and Sexual Activity   Alcohol use: No    Alcohol/week: 0.0 standard drinks of alcohol    Comment: rare   Drug use: No   Sexual activity: Not on file  Other Topics Concern   Not on file  Social History Narrative   Not on file   Social Determinants of Health   Financial Resource Strain: Medium Risk (10/28/2022)   Overall Financial Resource Strain (CARDIA)    Difficulty of Paying Living Expenses: Somewhat hard  Food Insecurity: No Food Insecurity (10/28/2022)   Hunger Vital Sign    Worried About Running Out of Food in the Last Year: Never true  Ran Out of Food in the Last Year: Never true  Transportation Needs: No Transportation Needs (10/28/2022)   PRAPARE - Administrator, Civil Service (Medical): No    Lack of Transportation (Non-Medical): No  Physical Activity: Insufficiently Active (10/28/2022)   Exercise Vital Sign    Days of Exercise per Week: 3 days    Minutes of Exercise per Session: 30 min  Stress: No Stress Concern Present (10/28/2022)   [employer] of Occupational Health - Occupational Stress Questionnaire    Feeling of Stress : Only a little  Social Connections: Moderately Isolated (10/28/2022)   Social Connection and Isolation Panel [NHANES]    Frequency of Communication with Friends and Family: More than three times a week    Frequency of Social Gatherings with Friends and Family: Three times a week    Attends Religious Services: More than 4 times per year    Active Member of Clubs or Organizations: No    Attends Banker Meetings: Never    Marital Status: Divorced     Family History: The patient's family history includes Cancer in her brother; Diabetes in her  mother; Heart disease in her father; High blood pressure in her mother. There is no history of Mental illness.  Her sister has atrial fibrillation, multiple nephews have atrial fibrillation.  ROS:   Please see the history of present illness.     All other systems reviewed and are negative.  EKGs/Labs/Other Studies Reviewed:    EKG:  EKG is  ordered today.  The ekg ordered today demonstrates normal sinus rhythm, heart rate 84  Recent Labs: 03/02/2022: ALT 75; Magnesium 2.0; TSH 1.359 05/14/2022: Hemoglobin 14.6; Platelets 291 10/03/2022: B Natriuretic Peptide 36.0; BUN 13; Creatinine, Ser 0.79; Potassium 3.6; Sodium 138  Recent Lipid Panel    Component Value Date/Time   CHOL 112 02/10/2022 1209   CHOL 151 12/05/2014 1117   TRIG 79 02/10/2022 1209   HDL 66 02/10/2022 1209   HDL 74 12/05/2014 1117   CHOLHDL 1.7 02/10/2022 1209   VLDL 14 03/01/2020 0306   LDLCALC 30 02/10/2022 1209    Physical Exam:    VS:  BP (!) 144/88 (BP Location: Left Arm, Patient Position: Sitting, Cuff Size: Large)   Pulse 84   Ht 5\' 8"  (1.727 m)   Wt 239 lb (108.4 kg)   SpO2 98%   BMI 36.34 kg/m     Wt Readings from Last 3 Encounters:  11/28/22 239 lb (108.4 kg)  11/18/22 240 lb (108.9 kg)  11/03/22 238 lb (108 kg)     GEN:  Well nourished, well developed in no acute distress HEENT: Normal NECK: No JVD; No carotid bruits CARDIAC: Regular rate and rhythm. RESPIRATORY:  Clear to auscultation without rales, wheezing or rhonchi  ABDOMEN: Soft, non-tender, distended MUSCULOSKELETAL:  trace edema; No deformity  SKIN: Warm and dry NEUROLOGIC:  Alert and oriented x 3 PSYCHIATRIC:  Normal affect   ASSESSMENT:    1. Paroxysmal A-fib (HCC)   2. Primary hypertension   3. HFrEF (heart failure with reduced ejection fraction) (HCC)   4. BMI 36.0-36.9,adult    PLAN:    Paroxysmal atrial fibrillation s/p ablation 05/2022.  Maintaining sinus rhythm.  Continue Toprol-XL 50 mg daily, Eliquis 5 mg twice  daily. Hypertension, BP elevated, usually controlled.  Continue Toprol-XL 50 mg daily, Benicar 20 mg daily, Aldactone 12.5 mg daily. Mild to moderately reduced initial EF, 40 to 45%.  Last echo 10/30/2022 EF 60 to  65%.  Etiology likely tachycardia induced.  Appears euvolemic.  Toprol-XL, Benicar, Aldactone as above for BP control. Obesity, continue low-calorie diet, exercise, weight loss, Ozempic.  Follow-up in 6 months.  Medication Adjustments/Labs and Tests Ordered: Current medicines are reviewed at length with the patient today.  Concerns regarding medicines are outlined above.  Orders Placed This Encounter  Procedures   EKG 12-Lead    No orders of the defined types were placed in this encounter.    Patient Instructions  Medication Instructions:   Your physician recommends that you continue on your current medications as directed. Please refer to the Current Medication list given to you today.  *If you need a refill on your cardiac medications before your next appointment, please call your pharmacy*   Lab Work:  None Ordered  If you have labs (blood work) drawn today and your tests are completely normal, you will receive your results only by: MyChart Message (if you have MyChart) OR A paper copy in the mail If you have any lab test that is abnormal or we need to change your treatment, we will call you to review the results.   Testing/Procedures:  None Ordered   Follow-Up: At [facility], you and your health needs are our priority.  As part of our continuing mission to provide you with exceptional heart care, we have created designated Provider Care Teams.  These Care Teams include your primary Cardiologist (physician) and Advanced Practice Providers (APPs -  Physician Assistants and Nurse Practitioners) who all work together to provide you with the care you need, when you need it.  We recommend signing up for the patient portal called "MyChart".  Sign up  information is provided on this After Visit Summary.  MyChart is used to connect with patients for Virtual Visits (Telemedicine).  Patients are able to view lab/test results, encounter notes, upcoming appointments, etc.  Non-urgent messages can be sent to your provider as well.   To learn more about what you can do with MyChart, go to [email or web address].    Your next appointment:   6 month(s)  Provider:   You may see Debbe Odea, MD or one of the following Advanced Practice Providers on your designated Care Team:   Nicolasa Ducking, NP Eula Listen, PA-C Cadence Fransico Michael, PA-C Charlsie Quest, NP   Signed, Debbe Odea, MD  11/28/2022 6:39 PM    [facility]

## 2022-11-28 NOTE — Patient Instructions (Signed)

## 2022-12-04 ENCOUNTER — Encounter: Payer: Self-pay | Admitting: Surgery

## 2022-12-04 ENCOUNTER — Ambulatory Visit (INDEPENDENT_AMBULATORY_CARE_PROVIDER_SITE_OTHER): Payer: Medicare HMO | Admitting: Surgery

## 2022-12-04 VITALS — BP 145/92 | HR 91 | Temp 98.7°F | Ht 68.0 in | Wt 238.8 lb

## 2022-12-04 DIAGNOSIS — R935 Abnormal findings on diagnostic imaging of other abdominal regions, including retroperitoneum: Secondary | ICD-10-CM | POA: Diagnosis not present

## 2022-12-04 NOTE — Patient Instructions (Addendum)
If you have any concerns or questions, please feel free to call our office.    Hematuria, Adult  Hematuria is blood in the urine. Blood may be visible in the urine, or it may be identified with a test. This condition can be caused by infections of the bladder, urethra, kidney, or prostate. Other possible causes include: Kidney stones. Cancer of the urinary tract. Too much calcium in the urine. Conditions that are passed from parent to child (inherited conditions). Exercise that requires a lot of energy. Infections can usually be treated with medicine, and a kidney stone usually will pass through your urine. If neither of these is the cause of your hematuria, more tests may be needed to identify the cause of your symptoms. It is very important to tell your health care provider about any blood in your urine, even if it is painless or the blood stops without treatment. Blood in the urine, when it happens and then stops and then happens again, can be a symptom of a very serious condition, including cancer. There is no pain in the initial stages of many urinary cancers. Follow these instructions at home: Medicines Take over-the-counter and prescription medicines only as told by your health care provider. If you were prescribed an antibiotic medicine, take it as told by your health care provider. Do not stop taking the antibiotic even if you start to feel better. Eating and drinking Drink enough fluid to keep your urine pale yellow. It is recommended that you drink 3-4 quarts (2.8-3.8 L) a day. If you have been diagnosed with an infection, drinking cranberry juice in addition to large amounts of water is recommended. Avoid caffeine, tea, and carbonated beverages. These tend to irritate the bladder. Avoid alcohol because it may irritate the prostate (in males). General instructions If you have been diagnosed with a kidney stone, follow your health care provider's instructions about straining your  urine to catch the stone. Empty your bladder often. Avoid holding urine for long periods of time. If you are female: After a bowel movement, wipe from front to back and use each piece of toilet paper only once. Empty your bladder before and after sex. Pay attention to any changes in your symptoms. Tell your health care provider about any changes or any new symptoms. It is up to you to get the results of any tests. Ask your health care provider, or the department that is doing the test, when your results will be ready. Keep all follow-up visits. This is important. Contact a health care provider if: You develop back pain. You have a fever or chills. You have nausea or vomiting. Your symptoms do not improve after 3 days. Your symptoms get worse. Get help right away if: You develop severe vomiting and are unable to take medicine without vomiting. You develop severe pain in your back or abdomen even though you are taking medicine. You pass a large amount of blood in your urine. You pass blood clots in your urine. You feel very weak or like you might faint. You faint. Summary Hematuria is blood in the urine. It has many possible causes. It is very important that you tell your health care provider about any blood in your urine, even if it is painless or the blood stops without treatment. Take over-the-counter and prescription medicines only as told by your health care provider. Drink enough fluid to keep your urine pale yellow. This information is not intended to replace advice given to you by your health  care provider. Make sure you discuss any questions you have with your health care provider. Document Revised: 12/07/2019 Document Reviewed: 12/07/2019 Elsevier Patient Education  2024 [employer].

## 2022-12-04 NOTE — Progress Notes (Signed)
Patient ID: Regina Christensen, female   DOB: Mar 30, 1956, 67 y.o.   MRN: [id number]  Chief Complaint: Abnormal CT scan  History of Present Illness Regina Christensen is a 67 y.o. female with presentation after CT scan identified 9 mm appendix, possible adjacent fatty changes suggestive of early appendicitis.  Patient is returning still without complaint.  Has no abdominal pain, nausea, vomiting, fevers or chills.  She has not been inadvertently treated with any antibiotics recently that might delay symptoms.  There were no features to the CT scan which would suggest any type of potential malignancy.  She has been evaluated by urology for her hematuria.  She continues to take Eliquis.  Past Medical History Past Medical History:  Diagnosis Date   Anxiety    Arthritis    Back pain    BPPV (benign paroxysmal positional vertigo)    CHF (congestive heart failure) (HCC)    Chronic diastolic HF (heart failure) (HCC)    Complication of anesthesia    Post-operative hypoxia requiring supplemental oxygen   Current use of long term anticoagulation    Apixaban   CVA (cerebral vascular accident) (HCC) 02/28/2020   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; imaging from [facility]   Depression    Edema of both lower extremities    GERD (gastroesophageal reflux disease)    Hypertension    IBS (irritable bowel syndrome)    Lactose intolerance    Morbid obesity with BMI of 45.0-49.9, adult (HCC)    Obesity    Osteoarthritis    PAF (paroxysmal atrial fibrillation) (HCC)    Pre-diabetes    Sleep apnea    uses CPAP machine sometimes    Type 2 diabetes mellitus without complication (HCC)    Vitamin D deficiency       Past Surgical History:  Procedure Laterality Date   ABDOMINAL HYSTERECTOMY     ATRIAL FIBRILLATION ABLATION N/A 05/29/2022   Procedure: ATRIAL FIBRILLATION ABLATION;  Surgeon: Lanier Prude, MD;  Location: [facility];  Service: Cardiovascular;  Laterality: N/A;   BREAST  EXCISIONAL BIOPSY Left    CARDIOVERSION N/A 03/05/2022   Procedure: CARDIOVERSION;  Surgeon: Debbe Odea, MD;  Location: [facility];  Service: Cardiovascular;  Laterality: N/A;   ENDOMETRIAL ABLATION     x2   ENDOSCOPIC CONCHA BULLOSA RESECTION Bilateral 08/08/2020   Procedure: ENDOSCOPIC CONCHA BULLOSA RESECTION;  Surgeon: Vernie Murders, MD;  Location: [facility];  Service: ENT;  Laterality: Bilateral;   ETHMOIDECTOMY Right 08/08/2020   Procedure: ETHMOIDECTOMY, LEFT PARTIAL ETHMOIDECTOMY;  Surgeon: Vernie Murders, MD;  Location: [facility];  Service: ENT;  Laterality: Right;   IMAGE GUIDED SINUS SURGERY N/A 08/08/2020   Procedure: IMAGE GUIDED SINUS SURGERY, RIGHT FRONTAL SINUSOTOMY;  Surgeon: Vernie Murders, MD;  Location: [facility];  Service: ENT;  Laterality: N/A;   LAPAROSCOPIC SLEEVE GASTRECTOMY     MAXILLARY ANTROSTOMY Bilateral 08/08/2020   Procedure: MAXILLARY ANTROSTOMY with tissue;  Surgeon: Vernie Murders, MD;  Location: [facility];  Service: ENT;  Laterality: Bilateral;   NASAL TURBINATE REDUCTION  08/27/2020   Procedure: TURBINATE REDUCTION /SUBMUCOSAL DEBRIDEMENT;  Surgeon: Vernie Murders, MD;  Location: [facility];  Service: ENT;;   TOTAL KNEE ARTHROPLASTY Right 06/2019   TOTAL KNEE ARTHROPLASTY Left 01/31/2021   [facility]    Allergies  Allergen Reactions   Hydrocodone-Acetaminophen Other (See Comments)    Extreme headache   Ace Inhibitors Cough   Hctz [Hydrochlorothiazide] Rash    face  Current Outpatient Medications  Medication Sig Dispense Refill   acetaminophen (TYLENOL) 650 MG CR tablet Take 650-1,300 mg by mouth every 8 (eight) hours as needed for pain.     apixaban (ELIQUIS) 5 MG TABS tablet Take 1 tablet (5 mg total) by mouth 2 (two) times daily. 180 tablet 3   atorvastatin (LIPITOR) 10 MG tablet TAKE ONE TABLET BY MOUTH ONCE DAILY 90 tablet 1   famotidine (PEPCID) 20 MG tablet Take 1 tablet (20 mg total) by mouth daily as needed for  indigestion. 90 tablet 1   furosemide (LASIX) 40 MG tablet Take 1 tablet by mouth once daily. 90 tablet 0   gabapentin (NEURONTIN) 100 MG capsule Take 100 mg by mouth at bedtime.     JARDIANCE 10 MG TABS tablet TAKE ONE TABLET BY MOUTH BEFORE BREAKFAST daily 30 tablet 5   meclizine (ANTIVERT) 12.5 MG tablet Take 1 tablet (12.5 mg total) by mouth 3 (three) times daily as needed for dizziness. 30 tablet 0   metoprolol succinate (TOPROL-XL) 50 MG 24 hr tablet Take 1.5 tablets (75 mg total) by mouth in the morning and at bedtime. Take with or immediately following a meal. 270 tablet 3   Multiple Vitamin (MULTIVITAMIN) tablet Take 1 tablet by mouth daily. BariatricPal Multivitamin     olmesartan (BENICAR) 20 MG tablet Take 1 tablet (20 mg total) by mouth daily. 90 tablet 3   pantoprazole (PROTONIX) 40 MG tablet Take 1 tablet (40 mg total) by mouth daily. 90 tablet 1   potassium chloride (KLOR-CON M) 10 MEQ tablet TAKE TWO TABLETS BY MOUTH DAILY 180 tablet 0   Semaglutide, 2 MG/DOSE, (OZEMPIC, 2 MG/DOSE,) 8 MG/3ML SOPN Inject 2 mg into the skin once a week. 9 mL 1   spironolactone (ALDACTONE) 25 MG tablet Take 0.5 tablets (12.5 mg total) by mouth daily. 45 tablet 3   triamcinolone (NASACORT) 55 MCG/ACT AERO nasal inhaler Place 2 sprays into the nose daily.     venlafaxine XR (EFFEXOR-XR) 75 MG 24 hr capsule Take 1 capsule (75 mg total) by mouth daily with breakfast. 90 capsule 1   Vitamin D, Ergocalciferol, (DRISDOL) 1.25 MG (50000 UNIT) CAPS capsule TAKE ONE CAPSULE BY MOUTH EVERY 7 DAYS 12 capsule 0   No current facility-administered medications for this visit.    Family History Family History  Problem Relation Age of Onset   Diabetes Mother    High blood pressure Mother    Heart disease Father    Cancer Brother    Mental illness Neg Hx       Social History Social History   Tobacco Use   Smoking status: Never   Smokeless tobacco: Never  Vaping Use   Vaping status: Never Used   Substance Use Topics   Alcohol use: No    Alcohol/week: 0.0 standard drinks of alcohol    Comment: rare   Drug use: No        Review of Systems  Constitutional: Negative.   HENT: Negative.    Eyes: Negative.   Respiratory: Negative.    Cardiovascular: Negative.   Gastrointestinal:  Negative for abdominal pain, blood in stool, constipation, diarrhea, nausea and vomiting.  Genitourinary:  Positive for hematuria.  Musculoskeletal:  Positive for back pain.  Skin: Negative.   Neurological:        Mild lumbar radiculopathy  Psychiatric/Behavioral: Negative.       Physical Exam Blood pressure (!) 145/92, pulse 91, temperature 98.7 F (37.1 C), temperature source Oral, height  5\' 8"  (1.727 m), weight 238 lb 12.8 oz (108.3 kg), SpO2 97%. Last Weight  Most recent update: 12/04/2022  2:53 PM    Weight  108.3 kg (238 lb 12.8 oz)             CONSTITUTIONAL: Well developed, and nourished, appropriately responsive and aware without distress.   EYES: Sclera non-icteric.   EARS, NOSE, MOUTH AND THROAT: Oral mucosa is pink and moist.  Hearing is intact to voice.  NECK: Trachea is midline, and there is no jugular venous distension.  LYMPH NODES:  Lymph nodes in the neck are not appreciated. RESPIRATORY:  Normal respiratory effort without pathologic use of accessory muscles. CARDIOVASCULAR: Well perfused.  GI: The abdomen is soft, nontender, and nondistended. There were no palpable masses. I did not appreciate hepatosplenomegaly.  MUSCULOSKELETAL:  Symmetrical muscle tone appreciated in all four extremities.    SKIN: Skin turgor is normal. No pathologic skin lesions appreciated.  NEUROLOGIC:  Motor and sensation appear grossly normal.  Cranial nerves are grossly without defect. PSYCH:  Alert and oriented to person, place and time. Affect is appropriate for situation.  Data Reviewed I have personally reviewed what is currently available of the patient's imaging, recent labs and medical  records.   Labs:     Latest Ref Rng & Units 05/14/2022   12:29 PM 03/10/2022    8:22 AM 03/05/2022    6:08 AM  CBC  WBC 4.0 - 10.5 K/uL 7.0  7.2  7.5   Hemoglobin 12.0 - 15.0 g/dL 96.0  45.4  09.8   Hematocrit 36.0 - 46.0 % 44.9  41.7  37.8   Platelets 150 - 400 K/uL 291  260  248       Latest Ref Rng & Units 10/03/2022    2:45 PM 07/15/2022    9:36 AM 05/14/2022   12:29 PM  CMP  Glucose 70 - 99 mg/dL 85  97  91   BUN 8 - 23 mg/dL 13  19  17    Creatinine 0.44 - 1.00 mg/dL 1.19  1.47  8.29   Sodium 135 - 145 mmol/L 138  138  137   Potassium 3.5 - 5.1 mmol/L 3.6  3.8  3.9   Chloride 98 - 111 mmol/L 104  107  102   CO2 22 - 32 mmol/L 23  27  23    Calcium 8.9 - 10.3 mg/dL 9.0  8.9  9.4       Imaging: Radiological images reviewed:   Within last 24 hrs: No results found.  Assessment    Clinical findings inconsistent with any appendicitis. Patient Active Problem List   Diagnosis Date Noted   Hypercoagulable state due to persistent atrial fibrillation (HCC) 06/26/2022   Abnormal CT of the abdomen 05/06/2022   Primary insomnia 04/25/2022   Chronic combined systolic and diastolic CHF (congestive heart failure) (HCC) 03/06/2022   HFrEF (heart failure with reduced ejection fraction) (HCC) 03/03/2022   Primary hypertension 03/03/2022   Atrial fibrillation with RVR (HCC) 03/02/2022   Dilated cardiomyopathy (HCC) 03/02/2022   Nonrheumatic tricuspid valve regurgitation 03/02/2022   Osteopenia of necks of both femurs 01/22/2022   History of total bilateral knee replacement 09/10/2021   History of blood transfusion 09/10/2020   Current use of long term anticoagulation    Diabetes mellitus (HCC)    GERD without esophagitis    Vitamin D deficiency 04/12/2020   History of CVA (cerebrovascular accident) without residual deficits 03/05/2020   History of hysterectomy 07/19/2019  MDD (major depressive disorder), recurrent, in full remission (HCC) 07/19/2019   Insomnia due to mental  condition 07/19/2019   Bilateral carpal tunnel syndrome 07/30/2016   OSA (obstructive sleep apnea) 03/07/2016   BPPV (benign paroxysmal positional vertigo) 08/28/2015   Hypertension associated with type 2 diabetes mellitus (HCC) 11/09/2014   GAD (generalized anxiety disorder) 11/09/2014   Acanthosis nigricans 11/09/2014   Morbid obesity (HCC) 01/06/2012    Plan    We discussed options of repeat imaging.  Or return as needed should she develop any additional symptoms.  She feels she has sufficient other things going on, and would just assume not pursue additional workup at this time.  I will be glad to see this pleasant lady again should anything change in the near future.  Face-to-face time spent with the patient and accompanying care providers(if present) was 30 minutes, with more than 50% of the time spent counseling, educating, and coordinating care of the patient.    These notes generated with voice recognition software. I apologize for typographical errors.  Campbell Lerner M.D., FACS 12/04/2022, 3:29 PM

## 2022-12-15 ENCOUNTER — Encounter (INDEPENDENT_AMBULATORY_CARE_PROVIDER_SITE_OTHER): Payer: Self-pay | Admitting: Adult Health

## 2022-12-15 ENCOUNTER — Ambulatory Visit (INDEPENDENT_AMBULATORY_CARE_PROVIDER_SITE_OTHER): Payer: Medicare HMO | Admitting: Adult Health

## 2022-12-15 VITALS — BP 127/84 | HR 74 | Temp 98.0°F | Ht 68.0 in | Wt 238.0 lb

## 2022-12-15 DIAGNOSIS — E669 Obesity, unspecified: Secondary | ICD-10-CM | POA: Diagnosis not present

## 2022-12-15 DIAGNOSIS — I480 Paroxysmal atrial fibrillation: Secondary | ICD-10-CM

## 2022-12-15 DIAGNOSIS — E1169 Type 2 diabetes mellitus with other specified complication: Secondary | ICD-10-CM | POA: Diagnosis not present

## 2022-12-15 DIAGNOSIS — E66813 Obesity, class 3: Secondary | ICD-10-CM

## 2022-12-15 DIAGNOSIS — Z7984 Long term (current) use of oral hypoglycemic drugs: Secondary | ICD-10-CM | POA: Diagnosis not present

## 2022-12-15 DIAGNOSIS — Z6836 Body mass index (BMI) 36.0-36.9, adult: Secondary | ICD-10-CM

## 2022-12-15 NOTE — Progress Notes (Signed)
WEIGHT SUMMARY AND BIOMETRICS  Vitals Temp: 98 F (36.7 C) BP: 127/84 Pulse Rate: 74 SpO2: 95 %   Anthropometric Measurements Height: 5\' 8"  (1.727 m) Weight: 238 lb (108 kg) BMI (Calculated): 36.2 Weight at Last Visit: 238lb Weight Lost Since Last Visit: 0 Weight Gained Since Last Visit: 0 Starting Weight: 277lb Total Weight Loss (lbs): 39 lb (17.7 kg)   Body Composition  Body Fat %: 47.7 % Fat Mass (lbs): 113.8 lbs Muscle Mass (lbs): 118.4 lbs Total Body Water (lbs): 98.2 lbs Visceral Fat Rating : 15   Other Clinical Data Fasting: yes Labs: no Today's Visit #: 26 Starting Date: 05/10/20    Chief Complaint:   OBESITY Regina Christensen is here to discuss her progress with her obesity treatment plan. She is on the the Category 1 Plan and states she is following her eating plan approximately 80 % of the time. She states she is exercising Gym 20-30 minutes 1 times per week.   Interim History:  Regina Christensen reports a VERY hectic schedule the last several weeks, which has interfered with meal planning/prepping and consistency of exercise at local gym  Exercise-due to hectic schedule - only able to go to [employer] once per week  Hydration-she estimates to drink at least 30 oz water/day  She reports following food recall typical of a day: Breakfast: Coffee with creamer and sugar Eggs (2)  Lunch: [employer]an [employer]  Dinner: she states this meal is "hit or miss"  Subjective:   1. Type 2 diabetes mellitus with other specified complication, without long-term current use of insulin (HCC) Lab Results  Component Value Date   HGBA1C 5.4 10/29/2022   HGBA1C 6.1 (A) 06/25/2022   HGBA1C 6.0 (H) 02/10/2022    Home fasting CBG 99-110s She denies sx's of hypoglycemia She is on daily Jardiance 10mg  and weekly Ozempic 2mg  injection Denies mass in neck, dysphagia, dyspepsia, persistent hoarseness, abdominal pain, or N/V/C   2. AF BP at goal at OV. She denies CP or  significant dyspnea with exertion.  11/28/2022 [facility] Notes- Regina Christensen is a 67 y.o. female with a hx of hypertension, paroxysmal A. fib s/p ablation 05/2022, CVA 02/2020 (2/2 not taking eliquis), HFrEF, normalized EF, obesity, sleep apnea who presents for follow-up.   Previous echocardiogram 02/2022 showed mild to moderately reduced EF.  Etiology deemed possibly tachycardia induced.  Medications were adjusted, repeat echocardiogram obtained last month with improved ejection fraction.  Denies any bleeding issues with Eliquis.  Denies any significant palpitations.  Was previously placed on Entresto, did not tolerate due to palpitations.  Benicar restarted.  States losing some weight with being on Ozempic.   PLAN:     Paroxysmal atrial fibrillation s/p ablation 05/2022.  Maintaining sinus rhythm.  Continue Toprol-XL 50 mg daily, Eliquis 5 mg twice daily. Hypertension, BP elevated, usually controlled.  Continue Toprol-XL 50 mg daily, Benicar 20 mg daily, Aldactone 12.5 mg daily. Mild to moderately reduced initial EF, 40 to 45%.  Last echo 10/30/2022 EF 60 to 65%.  Etiology likely tachycardia induced.  Appears euvolemic.  Toprol-XL, Benicar, Aldactone as above for BP control. Obesity, continue low-calorie diet, exercise, weight loss, Ozempic.  Assessment/Plan:   1. Type 2 diabetes mellitus with other specified complication, without long-term current use of insulin (HCC) Continue current antidiabetic regime Increase regular exercise  2. Continue current cardiac medications per PCP/Cards team Increase regular exercise  3. Obesity, current BMI 36.2  Regina Christensen is currently in the action  stage of change. As such, her goal is to continue with weight loss efforts. She has agreed to the Category 1 Plan.   Handouts: 100 cal snacks and Additional Breakfast Options  Goals for the next 6 weeks  1) Increase water intake to least 64 oz/day 2) Increase gym workouts: 2 x week  3) Meal  Plan/Prep on Sundays   Exercise goals: Older adults should follow the adult guidelines. When older adults cannot meet the adult guidelines, they should be as physically active as their abilities and conditions will allow.  Older adults should do exercises that maintain or improve balance if they are at risk of falling.  Older adults should determine their level of effort for physical activity relative to their level of fitness.   Behavioral modification strategies: increasing lean protein intake, decreasing simple carbohydrates, increasing vegetables, increasing water intake, no skipping meals, meal planning and cooking strategies, keeping healthy foods in the home, better snacking choices, and planning for success.  Regina Christensen has agreed to follow-up with our clinic in 4-6 weeks. She was informed of the importance of frequent follow-up visits to maximize her success with intensive lifestyle modifications for her multiple health conditions.   Objective:   Blood pressure 127/84, pulse 74, temperature 98 F (36.7 C), height 5\' 8"  (1.727 m), weight 238 lb (108 kg), SpO2 95%. Body mass index is 36.19 kg/m.  General: Cooperative, alert, well developed, in no acute distress. HEENT: Conjunctivae and lids unremarkable. Cardiovascular: Regular rhythm.  Lungs: Normal work of breathing. Neurologic: No focal deficits.   Lab Results  Component Value Date   CREATININE 0.79 10/03/2022   BUN 13 10/03/2022   NA 138 10/03/2022   K 3.6 10/03/2022   CL 104 10/03/2022   CO2 23 10/03/2022   Lab Results  Component Value Date   ALT 75 (H) 03/02/2022   AST 48 (H) 03/02/2022   ALKPHOS 83 03/02/2022   BILITOT 1.1 03/02/2022   Lab Results  Component Value Date   HGBA1C 5.4 10/29/2022   HGBA1C 6.1 (A) 06/25/2022   HGBA1C 6.0 (H) 02/10/2022   HGBA1C 5.8 (H) 08/28/2021   HGBA1C 5.6 03/13/2021   Lab Results  Component Value Date   INSULIN 8.0 08/28/2021   Lab Results  Component Value Date   TSH  1.359 03/02/2022   Lab Results  Component Value Date   CHOL 112 02/10/2022   HDL 66 02/10/2022   LDLCALC 30 02/10/2022   TRIG 79 02/10/2022   CHOLHDL 1.7 02/10/2022   Lab Results  Component Value Date   VD25OH 60 02/10/2022   VD25OH 38.1 08/28/2021   VD25OH 37.3 11/14/2020   Lab Results  Component Value Date   WBC 7.0 05/14/2022   HGB 14.6 05/14/2022   HCT 44.9 05/14/2022   MCV 89.6 05/14/2022   PLT 291 05/14/2022   Lab Results  Component Value Date   IRON 39 11/14/2020   TIBC 329 11/14/2020   FERRITIN 25 11/14/2020    Attestation Statements:   Reviewed by clinician on day of visit: allergies, medications, problem list, medical history, surgical history, family history, social history, and previous encounter notes.  Time spent on visit including pre-visit chart review and post-visit care and charting was 27 minutes.   I have reviewed the above documentation for accuracy and completeness, and I agree with the above. -  Regina Christensen d. Regina Petrelli, NP-C

## 2022-12-20 ENCOUNTER — Other Ambulatory Visit: Payer: Self-pay | Admitting: Family Medicine

## 2022-12-20 DIAGNOSIS — I5032 Chronic diastolic (congestive) heart failure: Secondary | ICD-10-CM

## 2022-12-20 DIAGNOSIS — E559 Vitamin D deficiency, unspecified: Secondary | ICD-10-CM

## 2022-12-22 ENCOUNTER — Other Ambulatory Visit: Payer: Self-pay | Admitting: Family

## 2022-12-22 ENCOUNTER — Other Ambulatory Visit: Payer: Self-pay | Admitting: Family Medicine

## 2022-12-22 ENCOUNTER — Other Ambulatory Visit: Payer: Self-pay | Admitting: Cardiology

## 2022-12-22 DIAGNOSIS — K219 Gastro-esophageal reflux disease without esophagitis: Secondary | ICD-10-CM

## 2022-12-22 DIAGNOSIS — Z8673 Personal history of transient ischemic attack (TIA), and cerebral infarction without residual deficits: Secondary | ICD-10-CM

## 2022-12-22 DIAGNOSIS — R600 Localized edema: Secondary | ICD-10-CM

## 2022-12-23 ENCOUNTER — Other Ambulatory Visit: Payer: Self-pay | Admitting: *Deleted

## 2022-12-23 DIAGNOSIS — I480 Paroxysmal atrial fibrillation: Secondary | ICD-10-CM

## 2022-12-23 MED ORDER — APIXABAN 5 MG PO TABS: 5.0000 mg | ORAL_TABLET | Freq: Two times a day (BID) | ORAL | 1 refills | Status: DC

## 2022-12-23 NOTE — Telephone Encounter (Signed)
Eliquis 5mg  refill request received. Patient is 67 years old, weight-108kg, Crea-0.79 on 10/03/22, Diagnosis-Afib, and last seen by Dr. Azucena Cecil on 11/28/22. Dose is appropriate based on dosing criteria. Will send in refill to requested pharmacy.

## 2022-12-25 ENCOUNTER — Encounter: Payer: Self-pay | Admitting: Family

## 2022-12-25 ENCOUNTER — Ambulatory Visit: Payer: Medicare HMO | Attending: Family | Admitting: Family

## 2022-12-25 VITALS — BP 136/73 | HR 83 | Resp 14 | Wt 241.0 lb

## 2022-12-25 DIAGNOSIS — F419 Anxiety disorder, unspecified: Secondary | ICD-10-CM | POA: Diagnosis not present

## 2022-12-25 DIAGNOSIS — I5032 Chronic diastolic (congestive) heart failure: Secondary | ICD-10-CM | POA: Diagnosis not present

## 2022-12-25 DIAGNOSIS — I10 Essential (primary) hypertension: Secondary | ICD-10-CM | POA: Diagnosis not present

## 2022-12-25 DIAGNOSIS — I428 Other cardiomyopathies: Secondary | ICD-10-CM | POA: Diagnosis not present

## 2022-12-25 DIAGNOSIS — F32A Depression, unspecified: Secondary | ICD-10-CM | POA: Diagnosis not present

## 2022-12-25 DIAGNOSIS — G4730 Sleep apnea, unspecified: Secondary | ICD-10-CM | POA: Insufficient documentation

## 2022-12-25 DIAGNOSIS — Z794 Long term (current) use of insulin: Secondary | ICD-10-CM | POA: Diagnosis not present

## 2022-12-25 DIAGNOSIS — I480 Paroxysmal atrial fibrillation: Secondary | ICD-10-CM | POA: Diagnosis not present

## 2022-12-25 DIAGNOSIS — R5383 Other fatigue: Secondary | ICD-10-CM | POA: Insufficient documentation

## 2022-12-25 DIAGNOSIS — Z7901 Long term (current) use of anticoagulants: Secondary | ICD-10-CM | POA: Diagnosis not present

## 2022-12-25 DIAGNOSIS — Z79899 Other long term (current) drug therapy: Secondary | ICD-10-CM | POA: Insufficient documentation

## 2022-12-25 DIAGNOSIS — I5022 Chronic systolic (congestive) heart failure: Secondary | ICD-10-CM

## 2022-12-25 DIAGNOSIS — Z7984 Long term (current) use of oral hypoglycemic drugs: Secondary | ICD-10-CM | POA: Diagnosis not present

## 2022-12-25 DIAGNOSIS — Z8673 Personal history of transient ischemic attack (TIA), and cerebral infarction without residual deficits: Secondary | ICD-10-CM | POA: Insufficient documentation

## 2022-12-25 DIAGNOSIS — E119 Type 2 diabetes mellitus without complications: Secondary | ICD-10-CM | POA: Insufficient documentation

## 2022-12-25 DIAGNOSIS — I110 Hypertensive heart disease with heart failure: Secondary | ICD-10-CM | POA: Insufficient documentation

## 2022-12-25 NOTE — Patient Instructions (Signed)
It was good to see you, keep up the good work.    If you receive a satisfaction survey regarding the [facility], please take the time to fill it out. This way we can continue to provide excellent care and make any changes that need to be made.   Call us in the future if you need [place] for anything!

## 2022-12-25 NOTE — Progress Notes (Signed)
PCP: [name], MD (last seen 08/24) Primary Cardiologist: Debbe Odea, MD (last seen 08/24) EP: Steffanie Dunn, MD (last seen 05/24)  HPI:  Ms Preziosi is a 67 y/o female with a history of DM, HTN, stroke, back pain, anxiety, depression, GERD, IBS, PAF, sleep apnea and chronic heart failure. Ablation done 05/29/22.  Admitted 03/02/22 due to palpitations and SOB and found to be in AF RVR.Cardiology consult obtained. Cardizem gtt started. Cardioversion completed. Hypokalemia corrected. Discharged after 4 days.   Echo 03/01/20: EF 55-60% along with mild LVH, Grade I DD, normal PA pressure, mild LAE and trivial MR. Echo 03/02/22: EF of 40-45% along with mild LVH, moderate LAE, mildy elevated PA pressure of 41.6 mmHg, mild/moderate MR and moderate/ severe TR.   Echo 10/30/22: EF 60-65% with mild LVH, normal PA pressure (30.2 mmHg), mild LAE, mild MR and mild/ moderate TR  Cardiac CT 05/22/22: IMPRESSION: 1. There is normal pulmonary vein drainage into the left atrium. (3 on the right and 2 on the left).  2. The left atrial appendage is a chicken wing type with two lobes and ostial size 27 x 20 mm and length 28 mm. There is no thrombus in the left atrial appendage.  3. The esophagus runs in the left atrial midline and is not in the proximity to any of the pulmonary veins    She presents today for a HF follow-up visit with a chief complaint of minimal fatigue with moderate exertion. Chronic in nature. Has associated minimal pedal edema along with this. Denies fatigue, chest pain, cough, palpitations, abdominal distention, dizziness, weight gain or difficulty sleeping. [facility]. Overall she says that she is feeling well.    ROS: All systems negative except as listed in HPI, PMH and Problem List.  SH:  Social History   Socioeconomic History   Marital status: Divorced    Spouse name: Not on file   Number of children: 2   Years of  education: Not on file   Highest education level: Associate degree: occupational, Scientist, product/process development, or vocational program  Occupational History   Occupation: Retired/ work PT    Employer: [employer]  Tobacco Use   Smoking status: Never   Smokeless tobacco: Never  Vaping Use   Vaping status: Never Used  Substance and Sexual Activity   Alcohol use: No    Alcohol/week: 0.0 standard drinks of alcohol    Comment: rare   Drug use: No   Sexual activity: Not on file  Other Topics Concern   Not on file  Social History Narrative   Not on file   Social Determinants of Health   Financial Resource Strain: Medium Risk (10/28/2022)   Overall Financial Resource Strain (CARDIA)    Difficulty of Paying Living Expenses: Somewhat hard  Food Insecurity: No Food Insecurity (10/28/2022)   Hunger Vital Sign    Worried About Running Out of Food in the Last Year: Never true    Ran Out of Food in the Last Year: Never true  Transportation Needs: No Transportation Needs (10/28/2022)   PRAPARE - Administrator, Civil Service (Medical): No    Lack of Transportation (Non-Medical): No  Physical Activity: Insufficiently Active (10/28/2022)   Exercise Vital Sign    Days of Exercise per Week: 3 days    Minutes of Exercise per Session: 30 min  Stress: No Stress Concern Present (10/28/2022)   [employer] of Occupational Health - Occupational Stress Questionnaire  Feeling of Stress : Only a little  Social Connections: Moderately Isolated (10/28/2022)   Social Connection and Isolation Panel [NHANES]    Frequency of Communication with Friends and Family: More than three times a week    Frequency of Social Gatherings with Friends and Family: Three times a week    Attends Religious Services: More than 4 times per year    Active Member of Clubs or Organizations: No    Attends Banker Meetings: Never    Marital Status: Divorced  Catering manager Violence: Not At Risk (10/28/2022)   Humiliation, Afraid,  Rape, and Kick questionnaire    Fear of Current or Ex-Partner: No    Emotionally Abused: No    Physically Abused: No    Sexually Abused: No    FH:  Family History  Problem Relation Age of Onset   Diabetes Mother    High blood pressure Mother    Heart disease Father    Cancer Brother    Mental illness Neg Hx     Past Medical History:  Diagnosis Date   Anxiety    Arthritis    Back pain    BPPV (benign paroxysmal positional vertigo)    CHF (congestive heart failure) (HCC)    Chronic diastolic HF (heart failure) (HCC)    Complication of anesthesia    Post-operative hypoxia requiring supplemental oxygen   Current use of long term anticoagulation    Apixaban   CVA (cerebral vascular accident) (HCC) 02/28/2020   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; imaging from [facility]   Depression    Edema of both lower extremities    GERD (gastroesophageal reflux disease)    Hypertension    IBS (irritable bowel syndrome)    Lactose intolerance    Morbid obesity with BMI of 45.0-49.9, adult (HCC)    Obesity    Osteoarthritis    PAF (paroxysmal atrial fibrillation) (HCC)    Pre-diabetes    Sleep apnea    uses CPAP machine sometimes    Type 2 diabetes mellitus without complication (HCC)    Vitamin D deficiency     Current Outpatient Medications  Medication Sig Dispense Refill   acetaminophen (TYLENOL) 650 MG CR tablet Take 650-1,300 mg by mouth every 8 (eight) hours as needed for pain.     apixaban (ELIQUIS) 5 MG TABS tablet Take 1 tablet (5 mg total) by mouth 2 (two) times daily. 180 tablet 1   atorvastatin (LIPITOR) 10 MG tablet TAKE 1 TABLET BY MOUTH ONCE DAILY 30 tablet 2   famotidine (PEPCID) 20 MG tablet TAKE 1 TABLET BY MOUTH DAILY AS NEEDED FOR INDIGESTION 30 tablet 2   furosemide (LASIX) 40 MG tablet TAKE 1 TABLET BY MOUTH DAILY *REFILL REQUEST* 90 tablet 10   gabapentin (NEURONTIN) 100 MG capsule Take 100 mg by mouth at bedtime.     JARDIANCE 10 MG TABS tablet TAKE 1  TABLET BY MOUTH BEFORE BREAKFAST DAILY 30 tablet 10   meclizine (ANTIVERT) 12.5 MG tablet Take 1 tablet (12.5 mg total) by mouth 3 (three) times daily as needed for dizziness. 30 tablet 0   metoprolol succinate (TOPROL-XL) 50 MG 24 hr tablet TAKE 1 & 1/2 TABLETS BY MOUTH EVERY MORNING AND TAKE 1& 1/2 TABLET BY MOUTH EVERY DAY AT BEDTIME 90 tablet 2   Multiple Vitamin (MULTIVITAMIN) tablet Take 1 tablet by mouth daily. BariatricPal Multivitamin     olmesartan (BENICAR) 20 MG tablet Take 1 tablet (20 mg total) by mouth  daily. 90 tablet 3   pantoprazole (PROTONIX) 40 MG tablet TAKE 1 TABLET BY MOUTH ONCE DAILY 30 tablet 2   potassium chloride (KLOR-CON M) 10 MEQ tablet TAKE 1 TABLET BY MOUTH ONCE DAILY 30 tablet 2   Semaglutide, 2 MG/DOSE, (OZEMPIC, 2 MG/DOSE,) 8 MG/3ML SOPN INJECT 2MG  SUBCUTANEOUSLY ONCE WEEKLY 3 mL 2   spironolactone (ALDACTONE) 25 MG tablet TAKE 1/2 TABLET BY MOUTH ONCE DAILY 15 tablet 10   triamcinolone (NASACORT) 55 MCG/ACT AERO nasal inhaler Place 2 sprays into the nose daily.     venlafaxine XR (EFFEXOR-XR) 75 MG 24 hr capsule Take 1 capsule (75 mg total) by mouth daily with breakfast. 90 capsule 1   Vitamin D, Ergocalciferol, (DRISDOL) 1.25 MG (50000 UNIT) CAPS capsule TAKE 1 CAPSULE BY MOUTH ONCE WEEKLY *REFILL REQUEST* 4 capsule 10   No current facility-administered medications for this visit.   Vitals:   12/25/22 0845  BP: 136/73  Pulse: 83  Resp: 14  SpO2: 100%  Weight: 241 lb (109.3 kg)   Wt Readings from Last 3 Encounters:  12/25/22 241 lb (109.3 kg)  12/15/22 238 lb (108 kg)  12/04/22 238 lb 12.8 oz (108.3 kg)   Lab Results  Component Value Date   CREATININE 0.79 10/03/2022   CREATININE 0.82 07/15/2022   CREATININE 0.72 05/14/2022   PHYSICAL EXAM:  General:  Well appearing. No resp difficulty HEENT: normal Neck: supple. JVP flat. No lymphadenopathy or thryomegaly appreciated. Cor: PMI normal. Regular rate & rhythm. No rubs, gallops or  murmurs. Lungs: clear Abdomen: soft, nontender, nondistended. No hepatosplenomegaly. No bruits or masses.  Extremities: no cyanosis, clubbing, rash, edema Neuro: alert & oriented x3, cranial nerves grossly intact. Moves all 4 extremities w/o difficulty. Affect pleasant.   ECG: on 11/28/22 was NSR   ASSESSMENT & PLAN:  1: NICM with reduced ejection fraction- - HF likely due to AF/ HTN - NYHA class II - euvolemic today - weighing daily; reminded to call for an overnight weight gain of > 2 pounds or a weekly weight gain of > 5 pounds - weight down 8 pounds from last visit here 3 months ago - Echo 03/01/20: EF 55-60% along with mild LVH, Grade I DD, normal PA pressure, mild LAE and trivial MR. - Echo 03/02/22: EF of 40-45% along with mild LVH, moderate LAE, mildy elevated PA pressure of 41.6 mmHg, mild/moderate MR and moderate/ severe TR.   - Echo 10/30/22: EF 60-65% with mild LVH, normal PA pressure (30.2 mmHg), mild LAE, mild MR and mild/ moderate TR - Cardiac CT 05/22/22: IMPRESSION: 1. There is normal pulmonary vein drainage into the left atrium. (3 on the right and 2 on the left).  2. The left atrial appendage is a chicken wing type with two lobes and ostial size 27 x 20 mm and length 28 mm. There is no thrombus in the left atrial appendage.  3. The esophagus runs in the left atrial midline and is not in the proximity to any of the pulmonary veins - not adding salt and reading food labels to keep daily sodium intake to < 2000mg   - continue furosemide 40mg  daily PRN - continue metoprolol succinate 75mg  BID - finish olmesartan 20mg  daily; could not tolerate entresto due to palpitations - continue potassium daily - continue spironolactone 12.5mg  daily - continue jardiance 10mg  daily  - saw cardiology (Agbor-Etang) 08/24 - BNP 10/03/22 was 36.0  2: HTN- - BP 136/73 - saw PCP (Danford) 08/24 - BMP 10/03/22 reviewed and  showed sodium 138, potassium 3.6, creatinine 0.79 & GFR  >60  3: Paroxysmal Atrial fibrillation- - saw EP Lalla Brothers) 05/24 & diltiazem stopped - ablation done 05/29/22 - continue eliquis 5mg  BID - continue metoprolol succinate 75mg  BID  4: DM- - A1c 10/29/22 was 6.4% - currently using ozempic - continue atorvastatin 10mg  daily - [facility]   Due to HF stability will not make a return appt at this time. Advised patient to follow closely with cardiology but that she could call back at anytime for questions or to make another appointment and she was comfortable with this plan.

## 2023-01-06 ENCOUNTER — Ambulatory Visit: Payer: Medicare HMO | Admitting: Urology

## 2023-01-07 ENCOUNTER — Other Ambulatory Visit: Payer: Medicare HMO | Admitting: Pharmacist

## 2023-01-07 ENCOUNTER — Encounter: Payer: Self-pay | Admitting: Pharmacist

## 2023-01-07 NOTE — Progress Notes (Signed)
01/07/2023 Name: Regina Christensen MRN: [id number] DOB: 06-Apr-1956  Chief Complaint  Patient presents with   Medication Management   Medication Assistance    Regina Christensen is a 67 y.o. year old female who presented for a telephone visit.   They were referred to the pharmacist by their PCP for assistance in managing medication access.    Subjective:  Care Team: Primary Care Provider: [name], MD; Next Scheduled Visit: 03/03/2023 Cardiologist: Regina Odea, MD Heart Failure Specialist: Regina Freeze, FNP Urologist: Regina Battiest, PA-C Neurologist: Regina Mink, PA [facility]: Regina Fusi, NP; Next Scheduled Visit: 01/26/2023  Medication Access/Adherence  Current Christensen:  Regina Christensen [phone] Regina Christensen, [place] - [address] [place] [address] Phone: [phone] Fax: [phone]  [facility] [address] [place] [address] Phone: [phone] Fax: [phone]  [facility] - [address] [place] - [address] [place] [address] Phone: [phone] Fax: [phone]   Patient reports affordability concerns with their medications: Yes  Patient reports access/transportation concerns to their Christensen: No  Patient reports adherence concerns with their medications:  No    Reports switched her prescriptions from Regina Christensen (which has closed) to Regina Christensen, but planning to switch to a different Christensen.  From review of preferred Christensen list for patient's plan from Regina Christensen website, note Regina Christensen and Regina Christensen listed as preferred options for patient's plan  Note patient currently in coverage gap of her Medicare prescription coverage.    Diabetes:  Current medications:  - Ozempic 2 mg weekly - Jardiance 10 mg daily before breakfast  Current glucose readings: morning fasting ranging  80s-101  Patient denies hypoglycemic s/sx including dizziness, shakiness, sweating.   Current medication access support:  - Enrolled in patient assistance program for Ozempic from Regina Christensen through 04/21/2023  - Enrolled in Regina Christensen Cardiomyopathy grant through 10/26/2023   Heart Failure/HTN:  Current medications:  ACEi/ARB/ARNI: olmesartan 20 mg daily SGLT2i: Jardiance 10 mg daily Beta blocker: metoprolol ER 50 mg - 1.5 tablets (75 mg) twice daily Mineralocorticoid Receptor Antagonist: spironolactone 25 mg - 1/2 tablet (12.5 mg) daily Diuretic regimen: furosemide 40 mg daily as needed  Denies checking home blood pressure recently.   Reports has a home upper arm monitor, but is not sure about accuracy of this device. Reports > 57 years old  Patient denies volume overload signs or symptoms today   Current medication access support: Enrolled in Regina Christensen Cardiomyopathy grant through 10/26/2023   Atrial Fibrillation:  Current medications: Rate Control: metoprolol ER 50 mg - 1.5 tablets (75 mg) twice daily Anticoagulation Regimen: Eliquis 5 mg twice daily  Denies checking home blood pressure recently.   Reports has a home upper arm monitor, but is not sure about accuracy of this device. Reports > 66 years old    Objective:  Lab Results  Component Value Date   HGBA1C 5.4 10/29/2022    Lab Results  Component Value Date   CREATININE 0.79 10/03/2022   BUN 13 10/03/2022   NA 138 10/03/2022   K 3.6 10/03/2022   CL 104 10/03/2022   CO2 23 10/03/2022    Lab Results  Component Value Date   CHOL 112 02/10/2022   HDL 66 02/10/2022   LDLCALC 30 02/10/2022   TRIG 79 02/10/2022   CHOLHDL 1.7 02/10/2022   BP Readings from Last 3 Encounters:  12/25/22 136/73  12/15/22 127/84  12/04/22 (!) 145/92   Pulse Readings from Last 3 Encounters:  12/25/22 83  12/15/22 74  12/04/22 91     Medications Reviewed Today     Reviewed by Regina Christensen, RPH-CPP (Pharmacist) on 01/07/23 at 1535  Med List Status: <None>   Medication Order Taking? Sig Documenting Provider Last Dose Status Informant  acetaminophen (TYLENOL) 650 MG CR tablet [id number] Yes Take 650-1,300 mg by mouth every 8 (eight) hours as needed for pain. [provider] Taking Active Self  apixaban (ELIQUIS) 5 MG TABS tablet [id number] Yes Take 1 tablet (5 mg total) by mouth 2 (two) times daily. Regina Odea, MD Taking Active   atorvastatin (LIPITOR) 10 MG tablet [id number] Yes TAKE 1 TABLET BY MOUTH ONCE DAILY Regina Cory, MD Taking Active   famotidine (PEPCID) 20 MG tablet [id number] Yes TAKE 1 TABLET BY MOUTH DAILY AS NEEDED FOR Regina Christensen, Regina Hefty, MD Taking Active   fexofenadine (ALLEGRA) 180 MG tablet [id number] Yes Take 180 mg by mouth daily as needed. [provider] Taking Active   furosemide (LASIX) 40 MG tablet [id number] Yes TAKE 1 TABLET BY MOUTH DAILY *REFILL REQUESTAlba Cory, MD Taking Active            Med Note Regina Christensen Dec 25, 2022  8:43 AM) Regina Christensen as needed for wt gain  gabapentin (NEURONTIN) 300 MG capsule [id number] Yes Take 300 mg by mouth 3 (three) times daily as needed (for pain). [provider] Taking Active   JARDIANCE 10 MG TABS tablet [id number] Yes TAKE 1 TABLET BY MOUTH BEFORE BREAKFAST DAILY Regina Freeze, FNP Taking Active   meclizine (ANTIVERT) 12.5 MG tablet [id number] Yes Take 1 tablet (12.5 mg total) by mouth 3 (three) times daily as needed for dizziness. Regina Cory, MD Taking Active   metoprolol succinate (TOPROL-XL) 50 MG 24 hr tablet [id number] Yes TAKE 1 & 1/2 TABLETS BY MOUTH EVERY MORNING AND TAKE 1& 1/2 TABLET BY MOUTH EVERY DAY AT BEDTIME Regina Prude, MD Taking Active   Multiple Vitamin (MULTIVITAMIN) tablet [id number] Yes Take 1 tablet by mouth daily. Regina Christensen Multivitamin [provider] Taking Active Self  olmesartan (BENICAR) 20 MG tablet  [id number] Yes Take 1 tablet (20 mg total) by mouth daily. Regina Odea, MD Taking Active   pantoprazole (PROTONIX) 40 MG tablet [id number] Yes TAKE 1 TABLET BY MOUTH ONCE DAILY Regina Cory, MD Taking Active   potassium chloride (KLOR-CON M) 10 MEQ tablet [id number] Yes TAKE 1 TABLET BY MOUTH ONCE DAILY Regina Cory, MD Taking Active   Semaglutide, 2 MG/DOSE, (OZEMPIC, 2 MG/DOSE,) 8 MG/3ML SOPN [id number] Yes INJECT 2MG  SUBCUTANEOUSLY ONCE WEEKLY Regina Cory, MD Taking Active   spironolactone (ALDACTONE) 25 MG tablet [id number] Yes TAKE 1/2 TABLET BY MOUTH ONCE DAILY Regina Freeze, FNP Taking Active   triamcinolone (NASACORT) 55 MCG/ACT AERO nasal inhaler [id number] Yes Place 2 sprays into the nose daily. [provider] Taking Active Self  venlafaxine XR (EFFEXOR-XR) 75 MG 24 hr capsule [id number] Yes Take 1 capsule (75 mg total) by mouth daily with breakfast. Jomarie Longs, MD Taking Active   Vitamin D, Ergocalciferol, (DRISDOL) 1.25 MG (50000 UNIT) CAPS capsule [id number] Yes TAKE 1 CAPSULE BY MOUTH ONCE WEEKLY *REFILL REQUESTAlba Cory, MD Taking Active               Assessment/Plan:   Patient considering switching her prescriptions to Regina Christensen. States will let office know if her preferred  Christensen list needs to be updated further  Comprehensive medication review performed; medication list updated in electronic medical record - From review of chart, appears patient has been taking Vitamin D 50,000 units weekly for >2 years. Appears Vitamin D level last checked on 02/10/2022 was within normal limits.  Will collaborate with PCP   Diabetes: - Currently controlled - Recommend to check glucose, keep log of results and have this record to review at upcoming medical appointments. Patient to contact provider office sooner if needed for readings outside of established parameters or symptoms  Hypertension/ Heart Failure: - Reviewed appropriate blood pressure  monitoring technique and reviewed goal blood pressure - Reviewed to weigh daily and when to contact cardiology with weight gain - Encourage patient to consider obtaining a new upper arm blood pressure monitor. Discuss importance of having correct cuff size for accuracy of readings - Recommend to monitor home blood pressure, keep log of results and have this record to review at upcoming medical appointments. Patient to contact provider office sooner if needed for readings outside of established parameters or symptoms   Atrial Fibrillation: - Meets Christensen criteria for Eliquis patient assistance program through [employer]. Will collaborate with provider, CPhT, and patient to pursue assistance.   Follow Up Plan: Clinical Pharmacist will follow up with patient by telephone on 02/18/2023 at 9 am  Estelle Grumbles, PharmD, [facility] [phone]

## 2023-01-09 ENCOUNTER — Telehealth: Payer: Self-pay | Admitting: Pharmacy Technician

## 2023-01-09 DIAGNOSIS — Z5986 Financial insecurity: Secondary | ICD-10-CM

## 2023-01-09 NOTE — Progress Notes (Signed)
[facility] Customer service manager [facility])                                            [facility] Quality Pharmacy Team    01/09/2023  Regina Christensen 10-29-1955 [id number]                                      Medication Assistance Referral  Referral From:  [facility] PharmD Estelle Grumbles  Medication/Company: Eliquis / [employer] Patient application portion:  Mailed Provider application portion: Faxed  to Dr. Debbe Odea Provider address/fax verified via: Office website   Pattricia Boss, CPhT [place]  Office: [phone] Fax: [phone] Email: [email or web address]

## 2023-01-14 NOTE — Patient Instructions (Signed)
Goals Addressed             This Visit's Progress    Pharmacy Goals       Please watch the mail for an envelope from [facility] containing the patient assistance program application. Please complete this application and mail back to [facility] Pharmacy Technician Noreene Larsson [place] along with a copy of your Medicare Part D prescription card and a copy of your proof of income document OR you can bring these documents to the office to have them faxed back to Attention: Pattricia Boss at Fax # [phone]   If you need to call Noreene Larsson, you can reach her at [phone]  Thank you!  Estelle Grumbles, PharmD, [facility] [phone]

## 2023-01-26 ENCOUNTER — Encounter (INDEPENDENT_AMBULATORY_CARE_PROVIDER_SITE_OTHER): Payer: Self-pay | Admitting: Adult Health

## 2023-01-26 ENCOUNTER — Ambulatory Visit (INDEPENDENT_AMBULATORY_CARE_PROVIDER_SITE_OTHER): Payer: Medicare HMO | Admitting: Adult Health

## 2023-01-26 VITALS — BP 139/82 | HR 74 | Temp 97.5°F | Ht 68.0 in | Wt 240.0 lb

## 2023-01-26 DIAGNOSIS — E1159 Type 2 diabetes mellitus with other circulatory complications: Secondary | ICD-10-CM

## 2023-01-26 DIAGNOSIS — E669 Obesity, unspecified: Secondary | ICD-10-CM

## 2023-01-26 DIAGNOSIS — Z6836 Body mass index (BMI) 36.0-36.9, adult: Secondary | ICD-10-CM | POA: Diagnosis not present

## 2023-01-26 DIAGNOSIS — E559 Vitamin D deficiency, unspecified: Secondary | ICD-10-CM | POA: Diagnosis not present

## 2023-01-26 DIAGNOSIS — Z7984 Long term (current) use of oral hypoglycemic drugs: Secondary | ICD-10-CM

## 2023-01-26 DIAGNOSIS — I152 Hypertension secondary to endocrine disorders: Secondary | ICD-10-CM

## 2023-01-26 DIAGNOSIS — E1169 Type 2 diabetes mellitus with other specified complication: Secondary | ICD-10-CM

## 2023-01-26 DIAGNOSIS — Z7985 Long-term (current) use of injectable non-insulin antidiabetic drugs: Secondary | ICD-10-CM | POA: Diagnosis not present

## 2023-01-26 DIAGNOSIS — E66813 Obesity, class 3: Secondary | ICD-10-CM

## 2023-01-26 MED ORDER — BLOOD PRESSURE KIT: 1.0000 [IU] | PACK | Freq: Every day | 0 refills | Status: AC

## 2023-01-26 NOTE — Progress Notes (Signed)
WEIGHT SUMMARY AND BIOMETRICS  Vitals Temp: (!) 97.5 F (36.4 C) BP: 139/82 Pulse Rate: 74 SpO2: 96 %   Anthropometric Measurements Height: 5\' 8"  (1.727 m) Weight: 240 lb (108.9 kg) BMI (Calculated): 36.5 Weight at Last Visit: 238lb Weight Lost Since Last Visit: 0 Weight Gained Since Last Visit: 2lb Starting Weight: 277lb Total Weight Loss (lbs): 37 lb (16.8 kg)   Body Composition  Body Fat %: 47.9 % Fat Mass (lbs): 115 lbs Muscle Mass (lbs): 118.8 lbs Total Body Water (lbs): 98.8 lbs Visceral Fat Rating : 15   Other Clinical Data Fasting: yes Labs: no Today's Visit #: 27 Starting Date: 05/10/20    Chief Complaint:   OBESITY Regina Christensen is here to discuss her progress with her obesity treatment plan. She is on the the Category 1 Plan and states she is following her eating plan approximately 70 % of the time. She states she is not currently exercising.   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 She experienced an exacerbation of back pain and used PRN Gabapentin 300mg  at 2300 last night- she is reporting morning sedation.  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  Subjective:   1. Hypertension associated with type 2 diabetes mellitus (HCC) BP stable at OV She denies CP with exertion She reports compliance with her current antihypertensive therapy- olmesartan (BENICAR) 20 MG tablet  furosemide (LASIX) 40 MG tablet  metoprolol succinate (TOPROL-XL) 50 MG 24 hr tablet  atorvastatin (LIPITOR) 10 MG tablet  spironolactone (ALDACTONE) 25 MG tablet   2. Type 2 diabetes mellitus with other specified complication, without long-term current use of insulin (HCC) Lab Results  Component Value Date   HGBA1C 5.4 10/29/2022   HGBA1C 6.1 (A) 06/25/2022    HGBA1C 6.0 (H) 02/10/2022    PCP/[name] manages current antidiabetic therapy- Semaglutide, 2 MG/DOSE, (OZEMPIC, 2 MG/DOSE,) 8 MG/3ML SOPN  JARDIANCE 10 MG TABS tablet   Last Fasting CBG was last week and reading at 98 She denies sx's of hypoglycemia  3. Vitamin D deficiency  Latest Reference Range & Units 08/28/21 09:04 02/10/22 12:09  Vitamin D, 25-Hydroxy 30 - 100 ng/mL 38.1 60   She is on daily OTC Multivitamin  Assessment/Plan:   1. Hypertension associated with type 2 diabetes mellitus (HCC) Order Blood Pressure KIT 1 Units by Does not apply route daily. Dispense: 1 kit, Refills: 0 ordered   2. Type 2 diabetes mellitus with other specified complication, without long-term current use of insulin (HCC) Continue daily Jardiance 10mg  and weekly Ozempic 2mg  Increase protein intake Check fasting and PP CBG at home- several times per week or if sx's of hypoglycemia occur  3. Vitamin D deficiency Continue daily OTC Multivitamin  4. Obesity, current BMI 36.5  [place] is currently in the action stage of change. As such, her goal is to continue with weight loss efforts. She has agreed to the Category 1 Plan.   Exercise goals: Older adults should follow the adult guidelines. When older adults cannot meet the adult guidelines, they should be as physically active as their abilities and conditions will allow.  Older adults should do exercises that maintain or improve balance if they are at risk of falling.  Older adults should determine their level of effort for physical activity relative to their level of fitness.  Older adults with  chronic conditions should understand whether and how their conditions affect their ability to do regular physical activity safely.  Behavioral modification strategies: increasing lean protein intake, decreasing simple carbohydrates, increasing vegetables, increasing water intake, no skipping meals, meal planning and cooking strategies, and planning for  success.  Branda has agreed to follow-up with our clinic in 4 weeks. She was informed of the importance of frequent follow-up visits to maximize her success with intensive lifestyle modifications for her multiple health conditions.   Objective:   Blood pressure 139/82, pulse 74, temperature (!) 97.5 F (36.4 C), height 5\' 8"  (1.727 m), weight 240 lb (108.9 kg), SpO2 96%. Body mass index is 36.49 kg/m.  General: Cooperative, alert, well developed, in no acute distress. HEENT: Conjunctivae and lids unremarkable. Cardiovascular: Regular rhythm.  Lungs: Normal work of breathing. Neurologic: No focal deficits.   Lab Results  Component Value Date   CREATININE 0.79 10/03/2022   BUN 13 10/03/2022   NA 138 10/03/2022   K 3.6 10/03/2022   CL 104 10/03/2022   CO2 23 10/03/2022   Lab Results  Component Value Date   ALT 75 (H) 03/02/2022   AST 48 (H) 03/02/2022   ALKPHOS 83 03/02/2022   BILITOT 1.1 03/02/2022   Lab Results  Component Value Date   HGBA1C 5.4 10/29/2022   HGBA1C 6.1 (A) 06/25/2022   HGBA1C 6.0 (H) 02/10/2022   HGBA1C 5.8 (H) 08/28/2021   HGBA1C 5.6 03/13/2021   Lab Results  Component Value Date   INSULIN 8.0 08/28/2021   Lab Results  Component Value Date   TSH 1.359 03/02/2022   Lab Results  Component Value Date   CHOL 112 02/10/2022   HDL 66 02/10/2022   LDLCALC 30 02/10/2022   TRIG 79 02/10/2022   CHOLHDL 1.7 02/10/2022   Lab Results  Component Value Date   VD25OH 60 02/10/2022   VD25OH 38.1 08/28/2021   VD25OH 37.3 11/14/2020   Lab Results  Component Value Date   WBC 7.0 05/14/2022   HGB 14.6 05/14/2022   HCT 44.9 05/14/2022   MCV 89.6 05/14/2022   PLT 291 05/14/2022   Lab Results  Component Value Date   IRON 39 11/14/2020   TIBC 329 11/14/2020   FERRITIN 25 11/14/2020    Attestation Statements:   Reviewed by clinician on day of visit: allergies, medications, problem list, medical history, surgical history, family history, social  history, and previous encounter notes.  I have reviewed the above documentation for accuracy and completeness, and I agree with the above. -  Lakyla Biswas d. Raunak Antuna, NP-C

## 2023-02-05 ENCOUNTER — Other Ambulatory Visit: Payer: Self-pay | Admitting: Psychiatry

## 2023-02-05 DIAGNOSIS — F3342 Major depressive disorder, recurrent, in full remission: Secondary | ICD-10-CM

## 2023-02-05 DIAGNOSIS — F411 Generalized anxiety disorder: Secondary | ICD-10-CM

## 2023-02-08 ENCOUNTER — Encounter (INDEPENDENT_AMBULATORY_CARE_PROVIDER_SITE_OTHER): Payer: Self-pay | Admitting: Adult Health

## 2023-02-18 ENCOUNTER — Other Ambulatory Visit: Payer: Medicare HMO | Admitting: Pharmacist

## 2023-02-18 NOTE — Patient Instructions (Signed)
Goals Addressed             This Visit's Progress    Pharmacy Goals       Please watch the mail for an envelope from [facility] containing the patient assistance program application. Please complete this application and mail back to [facility] Pharmacy Technician Noreene Larsson Simcox along with a copy of your Medicare Part D prescription card and a copy of your proof of income document OR you can bring these documents to the office to have them faxed back to Attention: Pattricia Boss at Fax # [phone]   If you need to call Noreene Larsson, you can reach her at [phone]   If you need to reach out to patient assistance programs regarding refills or to find out the status of your application, you can do so by calling:   [employer] at [phone]   Thank you!   Estelle Grumbles, PharmD, [facility] [phone]

## 2023-02-18 NOTE — Progress Notes (Signed)
02/18/2023 Name: Regina Christensen MRN: [id number] DOB: [birth year]  Chief Complaint  Patient presents with   Medication Assistance   Medication Management    Regina Christensen is a 67 y.o. year old female who presented for a telephone visit.   They were referred to the pharmacist by their PCP for assistance in managing medication access.      Subjective:   Care Team: Primary Care Provider: [name], MD; Next Scheduled Visit: 03/03/2023 Cardiologist: Debbe Odea, MD Heart Failure Specialist: Delma Freeze, FNP Urologist: Harle Battiest, PA-C Neurologist: Gelene Mink, PA Healthy Weight and Wellness: Julaine Fusi, NP; Next Scheduled Visit: 02/23/2023  Medication Access/Adherence  Current Pharmacy:  [facility] [address] [place] [address] Phone: [phone] Fax: [phone]  [facility] - [address] [place] - [address] [place] [address] Phone: [phone] Fax: [phone]  [facility] - [place], [place] - [address] [place] [address] Phone: [phone] Fax: [phone]   Patient reports affordability concerns with their medications: Yes  Patient reports access/transportation concerns to their pharmacy: No  Patient reports adherence concerns with their medications:  No     Today reports recently has had a cough/felt tired since last Thursday. However, reports she is feeling better since got rest over the weekend   Diabetes:   Current medications:  - Ozempic 2 mg weekly - Jardiance 10 mg daily before breakfast   Current glucose readings: last checked morning fasting earlier this week, recalls reading: 74   Patient denies hypoglycemic s/sx including dizziness, shakiness, sweating.    Current medication access support:  - Enrolled in patient assistance program for Ozempic from [employer]  through 04/21/2023   Reports currently has 2 month supply remaining - Enrolled in [employer] Cardiomyopathy grant through 10/26/2023     Heart Failure/HTN:   Current medications:  ACEi/ARB/ARNI: olmesartan 20 mg daily SGLT2i: Jardiance 10 mg daily Beta blocker: metoprolol ER 50 mg - 1.5 tablets (75 mg) twice daily Mineralocorticoid Receptor Antagonist: spironolactone 25 mg - 1/2 tablet (12.5 mg) daily Diuretic regimen: furosemide 40 mg daily as needed   Denies checking home blood pressure recently as needs to obtain new monitor   Patient denies volume overload signs or symptoms today     Current medication access support: Enrolled in [employer] Cardiomyopathy grant through 10/26/2023     Atrial Fibrillation:   Current medications: Rate Control: metoprolol ER 50 mg - 1.5 tablets (75 mg) twice daily Anticoagulation Regimen: Eliquis 5 mg twice daily   Denies checking home blood pressure recently as needs to obtain new monitor   Current medication access support: collaborating with CPhT Noreene Larsson Simcox to apply for patient assistance for Eliquis from [employer]. Reports that she completed application and is planning to mail this along with her supporting documents back to CPhT tomorrow   Objective:  Lab Results  Component Value Date   HGBA1C 5.4 10/29/2022    Lab Results  Component Value Date   CREATININE 0.79 10/03/2022   BUN 13 10/03/2022   NA 138 10/03/2022   K 3.6 10/03/2022   CL 104 10/03/2022   CO2 23 10/03/2022    Lab Results  Component Value Date   CHOL 112 02/10/2022   HDL 66 02/10/2022   LDLCALC 30 02/10/2022   TRIG 79 02/10/2022   CHOLHDL 1.7 02/10/2022   Last vitamin D Lab Results  Component Value Date   VD25OH 60 02/10/2022    BP Readings from Last 3 Encounters:  01/26/23 139/82  12/25/22 136/73  12/15/22 127/84   Pulse Readings from Last 3 Encounters:  01/26/23 74  12/25/22 83  12/15/22 74     Medications Reviewed Today      Reviewed by Manuela Neptune, RPH-CPP (Pharmacist) on 02/18/23 at 2142  Med List Status: <None>   Medication Order Taking? Sig Documenting Provider Last Dose Status Informant  acetaminophen (TYLENOL) 650 MG CR tablet [id number]  Take 650-1,300 mg by mouth every 8 (eight) hours as needed for pain. [provider]  Active Self  apixaban (ELIQUIS) 5 MG TABS tablet [id number]  Take 1 tablet (5 mg total) by mouth 2 (two) times daily. Debbe Odea, MD  Active   atorvastatin (LIPITOR) 10 MG tablet [id number]  TAKE 1 TABLET BY MOUTH ONCE DAILY [name], MD  Active   Blood Pressure KIT [id number]  1 Units by Does not apply route daily. William Hamburger D, NP  Active   famotidine (PEPCID) 20 MG tablet [id number]  TAKE 1 TABLET BY MOUTH DAILY AS NEEDED FOR Loney Hering, Danna Hefty, MD  Active   fexofenadine (ALLEGRA) 180 MG tablet [id number]  Take 180 mg by mouth daily as needed. [provider]  Active   furosemide (LASIX) 40 MG tablet [id number]  TAKE 1 TABLET BY MOUTH DAILY *REFILL REQUESTAlba Cory, MD  Active            Med Note Yetta Flock, Crawford Givens Dec 25, 2022  8:43 AM) Leanora Ivanoff as needed for wt gain  gabapentin (NEURONTIN) 300 MG capsule [id number]  Take 300 mg by mouth 3 (three) times daily as needed (for pain). [provider]  Active   JARDIANCE 10 MG TABS tablet [id number] Yes TAKE 1 TABLET BY MOUTH BEFORE BREAKFAST DAILY Delma Freeze, FNP Taking Active   meclizine (ANTIVERT) 12.5 MG tablet [id number]  Take 1 tablet (12.5 mg total) by mouth 3 (three) times daily as needed for dizziness. [name], MD  Active   metoprolol succinate (TOPROL-XL) 50 MG 24 hr tablet [id number]  TAKE 1 & 1/2 TABLETS BY MOUTH EVERY MORNING AND TAKE 1& 1/2 TABLET BY MOUTH EVERY DAY AT BEDTIME Lanier Prude, MD  Active   Multiple Vitamin (MULTIVITAMIN) tablet [id number]  Take 1 tablet by mouth daily. [employer]Pal Multivitamin [provider]  Active Self   olmesartan (BENICAR) 20 MG tablet [id number]  Take 1 tablet (20 mg total) by mouth daily. Debbe Odea, MD  Active   pantoprazole (PROTONIX) 40 MG tablet [id number]  TAKE 1 TABLET BY MOUTH ONCE DAILY [name], MD  Active   potassium chloride (KLOR-CON M) 10 MEQ tablet [id number]  TAKE 1 TABLET BY MOUTH ONCE DAILY [name], MD  Active   Semaglutide, 2 MG/DOSE, (OZEMPIC, 2 MG/DOSE,) 8 MG/3ML SOPN [id number] Yes INJECT 2MG  SUBCUTANEOUSLY ONCE WEEKLY [name], MD Taking Active   spironolactone (ALDACTONE) 25 MG tablet [id number]  TAKE 1/2 TABLET BY MOUTH ONCE DAILY Clarisa Kindred A, FNP  Active   triamcinolone (NASACORT) 55 MCG/ACT AERO nasal inhaler [id number]  Place 2 sprays into the nose daily. [provider]  Active Self  venlafaxine XR (EFFEXOR-XR) 75 MG 24 hr capsule [id number]  TAKE 1 CAPSULE BY MOUTH EVERY MORNING Eappen, Saramma, MD  Active   Vitamin D, Ergocalciferol, (DRISDOL) 1.25 MG (50000 UNIT) CAPS capsule [id number]  TAKE 1 CAPSULE BY MOUTH ONCE WEEKLY *REFILL REQUEST* Sowles,  Danna Hefty, MD  Active               Assessment/Plan:   Encourage patient to follow up with PCP office if respiratory symptoms do not continue to improve, worsen or for new symptoms  - From review of chart, appears patient has been taking Vitamin D 50,000 units weekly for >2 years. Appears Vitamin D level last checked on 02/10/2022 was within normal limits.             Will collaborate with PCP     Diabetes: - Currently controlled - Recommend to check glucose, keep log of results and have this record to review at upcoming medical appointments. Patient to contact provider office sooner if needed for readings outside of established parameters or symptoms - Remind patient to follow up with Ozempic patient assistance as needed for refills - Will collaborate with PCP and CPhT for support to patient with applying for re-enrollment in patient assistance for Ozempic from [employer]  for 2025 calendar year   Hypertension/ Heart Failure: - Reviewed appropriate blood pressure monitoring technique and reviewed goal blood pressure - Reviewed to weigh daily and when to contact cardiology with weight gain - Encourage patient to consider obtaining a new upper arm blood pressure monitor OTC. Discuss importance of having correct cuff size for accuracy of readings - Recommend to monitor home blood pressure, keep log of results and have this record to review at upcoming medical appointments. Patient to contact provider office sooner if needed for readings outside of established parameters or symptoms     Atrial Fibrillation: - Will continue to collaborate with provider, CPhT to aid patient with applying for Eliquis patient assistance program through [employer]   Follow Up Plan: Clinical Pharmacist will follow up with patient by telephone on 05/20/2023 at 9:00 AM    Estelle Grumbles, PharmD, [facility] [phone]

## 2023-02-23 ENCOUNTER — Encounter (INDEPENDENT_AMBULATORY_CARE_PROVIDER_SITE_OTHER): Payer: Self-pay | Admitting: Adult Health

## 2023-02-23 ENCOUNTER — Ambulatory Visit (INDEPENDENT_AMBULATORY_CARE_PROVIDER_SITE_OTHER): Payer: Medicare HMO | Admitting: Adult Health

## 2023-02-23 VITALS — BP 145/79 | HR 73 | Temp 97.5°F | Ht 68.0 in | Wt 236.0 lb

## 2023-02-23 DIAGNOSIS — Z6835 Body mass index (BMI) 35.0-35.9, adult: Secondary | ICD-10-CM

## 2023-02-23 DIAGNOSIS — I152 Hypertension secondary to endocrine disorders: Secondary | ICD-10-CM

## 2023-02-23 DIAGNOSIS — E559 Vitamin D deficiency, unspecified: Secondary | ICD-10-CM

## 2023-02-23 DIAGNOSIS — E1159 Type 2 diabetes mellitus with other circulatory complications: Secondary | ICD-10-CM

## 2023-02-23 DIAGNOSIS — E669 Obesity, unspecified: Secondary | ICD-10-CM

## 2023-02-23 DIAGNOSIS — H02882 Meibomian gland dysfunction right lower eyelid: Secondary | ICD-10-CM | POA: Diagnosis not present

## 2023-02-23 DIAGNOSIS — E1169 Type 2 diabetes mellitus with other specified complication: Secondary | ICD-10-CM

## 2023-02-23 DIAGNOSIS — Z7985 Long-term (current) use of injectable non-insulin antidiabetic drugs: Secondary | ICD-10-CM | POA: Diagnosis not present

## 2023-02-23 DIAGNOSIS — Z7984 Long term (current) use of oral hypoglycemic drugs: Secondary | ICD-10-CM | POA: Diagnosis not present

## 2023-02-23 DIAGNOSIS — Z789 Other specified health status: Secondary | ICD-10-CM

## 2023-02-23 DIAGNOSIS — H2513 Age-related nuclear cataract, bilateral: Secondary | ICD-10-CM | POA: Diagnosis not present

## 2023-02-23 DIAGNOSIS — H02884 Meibomian gland dysfunction left upper eyelid: Secondary | ICD-10-CM | POA: Diagnosis not present

## 2023-02-23 NOTE — Progress Notes (Signed)
WEIGHT SUMMARY AND BIOMETRICS  Vitals Temp: (!) 97.5 F (36.4 C) BP: (!) 145/79 Pulse Rate: 73 SpO2: 97 %   Anthropometric Measurements Height: 5\' 8"  (1.727 m) Weight: 236 lb (107 kg) BMI (Calculated): 35.89 Weight at Last Visit: 240lb Weight Lost Since Last Visit: 4lb Weight Gained Since Last Visit: 0 Starting Weight: 277lb Total Weight Loss (lbs): 41 lb (18.6 kg)   Body Composition  Body Fat %: 45.5 % Fat Mass (lbs): 107.4 lbs Muscle Mass (lbs): 122.2 lbs Total Body Water (lbs): 95 lbs Visceral Fat Rating : 14   Other Clinical Data Fasting: yes Labs: no Today's Visit #: 28 Starting Date: 05/10/20    Chief Complaint:   OBESITY Shron is here to discuss her progress with her obesity treatment plan. She is on the the Category 1 Plan and states she is following her eating plan approximately 70 % of the time. She states she is exercising- MOVING!   Interim History:  Ms. Nicol recently moved from her daughter's house to her own 1 bedroom apartment. Her family assisted her with the 2 day move. She reports her home is livable. Her kitchen will be fully operational by mid week, 02/25/23  Reviewed Bioimpedance results with pt: Muscle Mass: +3.4 lbs Adipose Mass: -7.6 lbs  Subjective:   1. Type 2 diabetes mellitus with other specified complication, without long-term current use of insulin (HCC) She reports fasting CBG upper 80s to low 100s She denies sx's of hypoglycemia PCP manges Semaglutide, 2 MG/DOSE, (OZEMPIC, 2 MG/DOSE,) 8 MG/3ML SOPN  JARDIANCE 10 MG TABS tablet   2. Hypertension associated with type 2 diabetes mellitus (HCC) BP above goal at OV. She has NOT taken any antihypertensive prior to OV. She denies acute cardiac sx's at present.  3. Vit D Def She is on daily OTC Multivitamin She has been off Ergocalciferol for >2 months  4. History of recent change in lifestyle She moved in with her daughter 5 years ago- immediately after the  birth of her grandchild. Ms. Mcneff was sharing her bedroom with the granddaughter. Now that she is 6, it was determined that they both needed more personal space.  Assessment/Plan:   1. Type 2 diabetes mellitus with other specified complication, without long-term current use of insulin (HCC) Check Labs  2. Hypertension associated with type 2 diabetes mellitus (HCC) Check Labs Take antihypertensives as directed olmesartan (BENICAR) 20 MG tablet  furosemide (LASIX) 40 MG tablet  metoprolol succinate (TOPROL-XL) 50 MG 24 hr tablet  atorvastatin (LIPITOR) 10 MG tablet  spironolactone (ALDACTONE) 25 MG tablet   3. Vit D Def Check Labs   4. History of recent change in lifestyle Complete move and unpack kitchen supplies ASAP Enjoy your new home!  5. Obesity, current BMI 35.89  Vernis is currently in the action stage of change. As such, her goal is to continue with weight loss efforts. She has agreed to the Category 1 Plan.   Exercise goals: Older adults should follow the adult guidelines. When older adults cannot meet the adult guidelines, they should be as physically active as their abilities and conditions will allow.  Older adults should do exercises that maintain or improve balance if they are at risk of falling.  Older adults should determine their level of effort for physical activity relative to their level of fitness.  Older adults with chronic conditions should understand whether and how their conditions affect their ability to do regular physical activity safely.  Behavioral modification strategies:  increasing lean protein intake, decreasing simple carbohydrates, increasing vegetables, increasing water intake, no skipping meals, meal planning and cooking strategies, keeping healthy foods in the home, and planning for success.  Dorlisa has agreed to follow-up with our clinic in 4 weeks. She was informed of the importance of frequent follow-up visits to maximize her success  with intensive lifestyle modifications for her multiple health conditions.   Coby was informed we would discuss her lab results at her next visit unless there is a critical issue that needs to be addressed sooner. Angi agreed to keep her next visit at the agreed upon time to discuss these results.  Objective:   Blood pressure (!) 145/79, pulse 73, temperature (!) 97.5 F (36.4 C), height 5\' 8"  (1.727 m), weight 236 lb (107 kg), SpO2 97%. Body mass index is 35.88 kg/m.  General: Cooperative, alert, well developed, in no acute distress. HEENT: Conjunctivae and lids unremarkable. Cardiovascular: Regular rhythm.  Lungs: Normal work of breathing. Neurologic: No focal deficits.   Lab Results  Component Value Date   CREATININE 0.79 10/03/2022   BUN 13 10/03/2022   NA 138 10/03/2022   K 3.6 10/03/2022   CL 104 10/03/2022   CO2 23 10/03/2022   Lab Results  Component Value Date   ALT 75 (H) 03/02/2022   AST 48 (H) 03/02/2022   ALKPHOS 83 03/02/2022   BILITOT 1.1 03/02/2022   Lab Results  Component Value Date   HGBA1C 5.4 10/29/2022   HGBA1C 6.1 (A) 06/25/2022   HGBA1C 6.0 (H) 02/10/2022   HGBA1C 5.8 (H) 08/28/2021   HGBA1C 5.6 03/13/2021   Lab Results  Component Value Date   INSULIN 8.0 08/28/2021   Lab Results  Component Value Date   TSH 1.359 03/02/2022   Lab Results  Component Value Date   CHOL 112 02/10/2022   HDL 66 02/10/2022   LDLCALC 30 02/10/2022   TRIG 79 02/10/2022   CHOLHDL 1.7 02/10/2022   Lab Results  Component Value Date   VD25OH 60 02/10/2022   VD25OH 38.1 08/28/2021   VD25OH 37.3 11/14/2020   Lab Results  Component Value Date   WBC 7.0 05/14/2022   HGB 14.6 05/14/2022   HCT 44.9 05/14/2022   MCV 89.6 05/14/2022   PLT 291 05/14/2022   Lab Results  Component Value Date   IRON 39 11/14/2020   TIBC 329 11/14/2020   FERRITIN 25 11/14/2020   Attestation Statements:   Reviewed by clinician on day of visit: allergies,  medications, problem list, medical history, surgical history, family history, social history, and previous encounter notes  I have reviewed the above documentation for accuracy and completeness, and I agree with the above. -  Karandeep Resende d. Lyvia Mondesir, NP-C

## 2023-02-24 LAB — COMPREHENSIVE METABOLIC PANEL
ALT: 13 [IU]/L (ref 0–32)
AST: 18 [IU]/L (ref 0–40)
Albumin: 4.1 g/dL (ref 3.9–4.9)
Alkaline Phosphatase: 116 [IU]/L (ref 44–121)
BUN/Creatinine Ratio: 12 (ref 12–28)
BUN: 10 mg/dL (ref 8–27)
Bilirubin Total: 0.4 mg/dL (ref 0.0–1.2)
CO2: 25 mmol/L (ref 20–29)
Calcium: 9.2 mg/dL (ref 8.7–10.3)
Chloride: 105 mmol/L (ref 96–106)
Creatinine, Ser: 0.81 mg/dL (ref 0.57–1.00)
Globulin, Total: 2.3 g/dL (ref 1.5–4.5)
Glucose: 88 mg/dL (ref 70–99)
Potassium: 4.1 mmol/L (ref 3.5–5.2)
Sodium: 143 mmol/L (ref 134–144)
Total Protein: 6.4 g/dL (ref 6.0–8.5)
eGFR: 80 mL/min/{1.73_m2} (ref >59)

## 2023-02-24 LAB — VITAMIN D 25 HYDROXY (VIT D DEFICIENCY, FRACTURES): Vit D, 25-Hydroxy: 68.4 ng/mL (ref 30.0–100.0)

## 2023-02-24 LAB — HEMOGLOBIN A1C
Est. average glucose Bld gHb Est-mCnc: 126 mg/dL
Hgb A1c MFr Bld: 6 % — ABNORMAL HIGH (ref 4.8–5.6)

## 2023-02-24 LAB — INSULIN, RANDOM: INSULIN: 6.1 u[IU]/mL (ref 2.6–24.9)

## 2023-02-26 ENCOUNTER — Telehealth: Payer: Self-pay | Admitting: Pharmacy Technician

## 2023-02-26 DIAGNOSIS — Z5986 Financial insecurity: Secondary | ICD-10-CM

## 2023-02-26 NOTE — Progress Notes (Signed)
[facility] Customer service manager [facility])                                            [facility] Team    02/26/2023  Regina Christensen [birth year] [id number]                                      Medication Assistance Referral  Referral From:  [facility] PharmD Estelle Grumbles  Medication/Company: [employer] / [employer] Patient application portion:  Mailed Provider application portion: Faxed  to Dr. [name] Provider address/fax verified via: Office website   Regina Christensen, Regina Christensen [place]  Office: [phone] Fax: [phone] Email: [email or web address]

## 2023-03-02 NOTE — Progress Notes (Unsigned)
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   IMPRESSION: 2021 1.  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  2.  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  3.  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    MR Angio neck and head with and without contrast was normal   Vertigo: she was diagnosed with BPPV previously had Epley maneuver,  she had vestibular rehab but still needs to take meclizine , she states once had to take it twice in 2024   Afib: She had an ablation done on 05/29/2022 by Dr. Lalla Brothers,  and doing well since , still on Eliquis . No recently palpitations , she states usually about once a month and lasts only a few seconds not associated with chest pain or SOB  CHF chronic combined  systolic/diastolic  : Echo in 4098 showed EF 60 % and in Nov 2023 it was down to 40-45%, improved on last Echo again, she still has diastolic dysfunction 07/202. She was going to CHF clinic but has been released and only seeing Cardiologist.  She denies SOB with activity . She states lower extremity edema is very mild  She also has mild pulmonary hypertension and some mild to moderate MR and moderate to severe TR. She is on Spironolactone, SGL-2 agonist, Furosemide, metoprolol and ARB .    MDD/GAD: she was seeing Dr. Elna Breslow f but is now only taking Effexor 75 mg and symptoms have been controlled and stable, she was advised to get refills from me and go back if needed.    HTN: she is currently on metoprolol , aldactone and Benicar. No chest pain , SOB or palpitation  She has been compliant with low salt  diet and medications   OSA/Pulmonary hypertension: explained importance of resuming using her CPAP , she uses it prn only   Morbid obesity:  [facility], BMI over 35 with co-morbidities such as DM, HTN and CHF  She has a history of sleeve  bariatric surgery about 10 years ago, her weight prior to surgery was 300 lbs and  she lost 50 lbs and weight was between 250 lbs and 260 lbs for a while, but since started on Ozempic since 2023 and weight today is down 242 lbs. She wants to lose more   History of bilateral knee replacement: right in 2021 and left Oct 2022, she no longer needs a cane. Seh is having pain on right knee and effusion and is going to see ortho today    DMII: with hypertension and dyslipidemia, diagnosed by the [facility]. She is still taking Ozempic , no side effects. She  denies polyphagia, polyuria or polydipsia. A1C is at goal, last level was 6 % . Positive for urine micro in October, she is on SGL-2 agonist and  ARB. We will recheck urine micro today   GERD: she is taking PPI and pepcid daily to control symptoms, she also states was given a diagnosis of IBS diarrhea type many years ago, she states symptoms are getting worse  over the past few month and had an episode of incontinence lately She is willing to see GI now   Patient Active Problem List   Diagnosis Date Noted   Cerebral vascular disease 03/03/2023   Moderate major depression (HCC) 03/03/2023   Hypercoagulable state due to persistent atrial fibrillation (HCC) 06/26/2022   Abnormal CT of the abdomen 05/06/2022   Primary insomnia 04/25/2022   Chronic combined systolic and diastolic heart failure (HCC) 03/06/2022   HFrEF (heart failure with reduced ejection fraction) (HCC) 03/03/2022   Primary hypertension 03/03/2022   Atrial fibrillation with RVR (HCC) 03/02/2022   Dilated cardiomyopathy (HCC) 03/02/2022   Nonrheumatic tricuspid valve regurgitation 03/02/2022    Osteopenia of necks of both femurs 01/22/2022   History of total bilateral knee replacement 09/10/2021   History of blood transfusion 09/10/2020   Current use of long term anticoagulation    Controlled type 2 diabetes mellitus with microalbuminuria, without long-term current use of insulin (HCC)    GERD without esophagitis    Vitamin D deficiency 04/12/2020   History of CVA (cerebrovascular accident) without residual deficits 03/05/2020   History of hysterectomy 07/19/2019   MDD (major depressive disorder), recurrent, in full remission (HCC) 07/19/2019   Insomnia due to mental condition 07/19/2019   Bilateral carpal tunnel syndrome 07/30/2016   OSA (obstructive sleep apnea) 03/07/2016   BPPV (benign paroxysmal positional vertigo) 08/28/2015   Hypertension associated with type 2 diabetes mellitus (HCC) 11/09/2014   GAD (generalized anxiety disorder) 11/09/2014   Acanthosis nigricans 11/09/2014   Morbid obesity (HCC) 01/06/2012    Past Surgical History:  Procedure Laterality Date   ABDOMINAL HYSTERECTOMY     ATRIAL FIBRILLATION ABLATION N/A 05/29/2022   Procedure: ATRIAL FIBRILLATION ABLATION;  Surgeon: Lanier Prude, MD;  Location: [facility];  Service: Cardiovascular;  Laterality: N/A;   BREAST EXCISIONAL BIOPSY Left    CARDIOVERSION N/A 03/05/2022   Procedure: CARDIOVERSION;  Surgeon: Debbe Odea, MD;  Location: [facility];  Service: Cardiovascular;  Laterality: N/A;   ENDOMETRIAL ABLATION     x2   ENDOSCOPIC CONCHA BULLOSA RESECTION Bilateral 08/08/2020   Procedure: ENDOSCOPIC CONCHA BULLOSA RESECTION;  Surgeon: Vernie Murders, MD;  Location: [facility];  Service: ENT;  Laterality: Bilateral;   ETHMOIDECTOMY Right 08/08/2020   Procedure: ETHMOIDECTOMY, LEFT PARTIAL ETHMOIDECTOMY;  Surgeon: Vernie Murders, MD;  Location: [facility];  Service: ENT;  Laterality: Right;   IMAGE GUIDED SINUS SURGERY N/A 08/08/2020   Procedure: IMAGE GUIDED SINUS SURGERY, RIGHT FRONTAL  SINUSOTOMY;  Surgeon: Vernie Murders, MD;  Location: [facility];  Service: ENT;  Laterality: N/A;   LAPAROSCOPIC SLEEVE GASTRECTOMY     MAXILLARY ANTROSTOMY Bilateral 08/08/2020   Procedure: MAXILLARY ANTROSTOMY with tissue;  Surgeon: Vernie Murders, MD;  Location: [facility];  Service: ENT;  Laterality: Bilateral;   NASAL TURBINATE REDUCTION  08/27/2020   Procedure: TURBINATE REDUCTION /SUBMUCOSAL DEBRIDEMENT;  Surgeon: Vernie Murders, MD;  Location: [facility];  Service: ENT;;   TOTAL KNEE ARTHROPLASTY Right 06/2019   TOTAL KNEE ARTHROPLASTY Left 01/31/2021   [facility]    Family History  Problem Relation Age of Onset   Diabetes Mother    High blood pressure Mother    Heart disease Father    Cancer Brother    Mental illness Neg Hx     Social History   Tobacco Use   Smoking status: Never   Smokeless tobacco: Never  Substance Use Topics   Alcohol use: No    Alcohol/week: 0.0  standard drinks of alcohol    Comment: rare     Current Outpatient Medications:    acetaminophen (TYLENOL) 650 MG CR tablet, Take 650-1,300 mg by mouth every 8 (eight) hours as needed for pain., Disp: , Rfl:    apixaban (ELIQUIS) 5 MG TABS tablet, Take 1 tablet (5 mg total) by mouth 2 (two) times daily., Disp: 180 tablet, Rfl: 1   atorvastatin (LIPITOR) 10 MG tablet, TAKE 1 TABLET BY MOUTH ONCE DAILY, Disp: 30 tablet, Rfl: 2   Blood Pressure KIT, 1 Units by Does not apply route daily., Disp: 1 kit, Rfl: 0   famotidine (PEPCID) 20 MG tablet, TAKE 1 TABLET BY MOUTH DAILY AS NEEDED FOR INDIGESTION, Disp: 30 tablet, Rfl: 2   fexofenadine (ALLEGRA) 180 MG tablet, Take 180 mg by mouth daily as needed., Disp: , Rfl:    furosemide (LASIX) 40 MG tablet, TAKE 1 TABLET BY MOUTH DAILY *REFILL REQUEST*, Disp: 90 tablet, Rfl: 10   gabapentin (NEURONTIN) 300 MG capsule, Take 300 mg by mouth 3 (three) times daily as needed (for pain)., Disp: , Rfl:    JARDIANCE 10 MG TABS tablet, TAKE 1 TABLET BY MOUTH  BEFORE BREAKFAST DAILY, Disp: 30 tablet, Rfl: 10   meclizine (ANTIVERT) 12.5 MG tablet, Take 1 tablet (12.5 mg total) by mouth 3 (three) times daily as needed for dizziness., Disp: 30 tablet, Rfl: 0   metoprolol succinate (TOPROL-XL) 50 MG 24 hr tablet, TAKE 1 & 1/2 TABLETS BY MOUTH EVERY MORNING AND TAKE 1& 1/2 TABLET BY MOUTH EVERY DAY AT BEDTIME, Disp: 90 tablet, Rfl: 2   Multiple Vitamin (MULTIVITAMIN) tablet, Take 1 tablet by mouth daily. BariatricPal Multivitamin, Disp: , Rfl:    olmesartan (BENICAR) 20 MG tablet, Take 1 tablet (20 mg total) by mouth daily., Disp: 90 tablet, Rfl: 3   pantoprazole (PROTONIX) 40 MG tablet, TAKE 1 TABLET BY MOUTH ONCE DAILY, Disp: 30 tablet, Rfl: 2   potassium chloride (KLOR-CON M) 10 MEQ tablet, TAKE 1 TABLET BY MOUTH ONCE DAILY, Disp: 30 tablet, Rfl: 2   Semaglutide, 2 MG/DOSE, (OZEMPIC, 2 MG/DOSE,) 8 MG/3ML SOPN, INJECT 2MG  SUBCUTANEOUSLY ONCE WEEKLY, Disp: 3 mL, Rfl: 2   spironolactone (ALDACTONE) 25 MG tablet, TAKE 1/2 TABLET BY MOUTH ONCE DAILY, Disp: 15 tablet, Rfl: 10   triamcinolone (NASACORT) 55 MCG/ACT AERO nasal inhaler, Place 2 sprays into the nose daily., Disp: , Rfl:    venlafaxine XR (EFFEXOR-XR) 75 MG 24 hr capsule, TAKE 1 CAPSULE BY MOUTH EVERY MORNING, Disp: 30 capsule, Rfl: 2   Vitamin D, Ergocalciferol, (DRISDOL) 1.25 MG (50000 UNIT) CAPS capsule, TAKE 1 CAPSULE BY MOUTH ONCE WEEKLY *REFILL REQUEST*, Disp: 4 capsule, Rfl: 10  Allergies  Allergen Reactions   Hydrocodone-Acetaminophen Other (See Comments)    Extreme headache   Ace Inhibitors Cough   Hctz [Hydrochlorothiazide] Rash    face    I personally reviewed active problem list, medication list, allergies, family history, social history, health maintenance with the patient/caregiver today.   ROS  Ten systems reviewed and is negative except as mentioned in HPI    Objective  Vitals:   03/03/23 0806  BP: 132/78  Pulse: 76  Resp: 16  SpO2: 99%  Weight: 236 lb (107 kg)   Height: 5\' 8"  (1.727 m)    Body mass index is 35.88 kg/m.  Physical Exam  Constitutional: Patient appears well-developed and well-nourished. Obese  No distress.  HEENT: head atraumatic, normocephalic, pupils equal and reactive to light, neck supple Cardiovascular: Normal  rate, regular rhythm and normal heart sounds.  No murmur heard. Trace BLE edema. Pulmonary/Chest: Effort normal and breath sounds normal. No respiratory distress. Abdominal: Soft.  There is no tenderness. Psychiatric: Patient has a normal mood and affect. behavior is normal. Judgment and thought content normal.    PHQ2/9:    03/03/2023    8:05 AM 10/29/2022    8:48 AM 10/28/2022    8:26 AM 09/24/2022    8:39 AM 06/25/2022    8:21 AM  Depression screen PHQ 2/9  Decreased Interest 0 0 0  0  Down, Depressed, Hopeless 0 0 0  0  PHQ - 2 Score 0 0 0  0  Altered sleeping 0 0   0  Tired, decreased energy 0 0   2  Change in appetite 0 0   0  Feeling bad or failure about yourself  0 0   0  Trouble concentrating 0 0   0  Moving slowly or fidgety/restless 0 0   0  Suicidal thoughts 0 0   0  PHQ-9 Score 0 0   2     Information is confidential and restricted. Go to Review Flowsheets to unlock data.    phq 9 is negative   Fall Risk:    03/03/2023    8:05 AM 10/29/2022    8:47 AM 10/28/2022    8:27 AM 06/25/2022    8:20 AM 04/08/2022    4:15 PM  Fall Risk   Falls in the past year? 0 0 0 0 0  Number falls in past yr: 0 0 0 0 0  Injury with Fall? 0 0 0 0   Risk for fall due to : No Fall Risks No Fall Risks No Fall Risks No Fall Risks Medication side effect  Follow up Falls prevention discussed Falls prevention discussed Falls prevention discussed;Education provided;Falls evaluation completed Falls prevention discussed Falls prevention discussed      Functional Status Survey: Is the patient deaf or have difficulty hearing?: No Does the patient have difficulty seeing, even when wearing glasses/contacts?: No Does the  patient have difficulty concentrating, remembering, or making decisions?: No Does the patient have difficulty walking or climbing stairs?: No Does the patient have difficulty dressing or bathing?: No Does the patient have difficulty doing errands alone such as visiting a doctor's office or shopping?: No    Assessment & Plan  1. Controlled type 2 diabetes mellitus with microalbuminuria, without long-term current use of insulin (HCC)  - Urine Microalbumin w/creat. ratio - Lipid Profile  2. Chronic combined systolic and diastolic heart failure (HCC)  Released from CHF clinic but seeing cardiologist   3. Atrial fibrillation with RVR (HCC)  Rate controlled now  4. Morbid obesity (HCC)  Discussed with the patient the risk posed by an increased BMI. Discussed importance of portion control, calorie counting and at least 150 minutes of physical activity weekly. Avoid sweet beverages and drink more water. Eat at least 6 servings of fruit and vegetables daily    5. Hypertension associated with type 2 diabetes mellitus (HCC)  Well controlled   6. MDD (major depressive disorder), recurrent, in full remission (HCC)  We will send refill of Effexor next time she needs a rx   7. GERD without esophagitis  Stable  8. Need for immunization against influenza  - Flu Vaccine Trivalent High Dose (Fluad)  9. History of CVA (cerebrovascular accident) without residual deficits  - Lipid Profile  10. Cerebral vascular disease  Continue statin therapy  and control of DM and HTN  11. Change in bowel movement  - Ambulatory referral to Gastroenterology

## 2023-03-03 ENCOUNTER — Encounter: Payer: Self-pay | Admitting: Family Medicine

## 2023-03-03 ENCOUNTER — Ambulatory Visit: Payer: Medicare HMO | Admitting: Family Medicine

## 2023-03-03 VITALS — BP 132/78 | HR 76 | Resp 16 | Ht 68.0 in | Wt 236.0 lb

## 2023-03-03 DIAGNOSIS — K219 Gastro-esophageal reflux disease without esophagitis: Secondary | ICD-10-CM

## 2023-03-03 DIAGNOSIS — Z23 Encounter for immunization: Secondary | ICD-10-CM | POA: Diagnosis not present

## 2023-03-03 DIAGNOSIS — R198 Other specified symptoms and signs involving the digestive system and abdomen: Secondary | ICD-10-CM

## 2023-03-03 DIAGNOSIS — Z8673 Personal history of transient ischemic attack (TIA), and cerebral infarction without residual deficits: Secondary | ICD-10-CM

## 2023-03-03 DIAGNOSIS — F3342 Major depressive disorder, recurrent, in full remission: Secondary | ICD-10-CM | POA: Diagnosis not present

## 2023-03-03 DIAGNOSIS — I679 Cerebrovascular disease, unspecified: Secondary | ICD-10-CM | POA: Diagnosis not present

## 2023-03-03 DIAGNOSIS — I152 Hypertension secondary to endocrine disorders: Secondary | ICD-10-CM | POA: Diagnosis not present

## 2023-03-03 DIAGNOSIS — I5042 Chronic combined systolic (congestive) and diastolic (congestive) heart failure: Secondary | ICD-10-CM

## 2023-03-03 DIAGNOSIS — R809 Proteinuria, unspecified: Secondary | ICD-10-CM

## 2023-03-03 DIAGNOSIS — E1129 Type 2 diabetes mellitus with other diabetic kidney complication: Secondary | ICD-10-CM

## 2023-03-03 DIAGNOSIS — F321 Major depressive disorder, single episode, moderate: Secondary | ICD-10-CM | POA: Insufficient documentation

## 2023-03-03 DIAGNOSIS — E1159 Type 2 diabetes mellitus with other circulatory complications: Secondary | ICD-10-CM

## 2023-03-03 DIAGNOSIS — I4891 Unspecified atrial fibrillation: Secondary | ICD-10-CM

## 2023-03-03 NOTE — Patient Instructions (Signed)
Call insurance today to find out if it is cheaper to get a 90 day supply

## 2023-03-04 LAB — LIPID PANEL
Cholesterol: 124 mg/dL (ref <200)
HDL: 77 mg/dL (ref >=50)
LDL Cholesterol (Calc): 34 mg/dL
Non-HDL Cholesterol (Calc): 47 mg/dL (ref <130)
Total CHOL/HDL Ratio: 1.6 (calc) (ref <5.0)
Triglycerides: 57 mg/dL (ref <150)

## 2023-03-04 LAB — MICROALBUMIN / CREATININE URINE RATIO
Creatinine, Urine: 243 mg/dL (ref 20–275)
Microalb Creat Ratio: 7 mg/g{creat} (ref <30)
Microalb, Ur: 1.8 mg/dL

## 2023-03-05 ENCOUNTER — Encounter: Payer: Self-pay | Admitting: Family Medicine

## 2023-03-06 ENCOUNTER — Other Ambulatory Visit: Payer: Self-pay | Admitting: Pharmacy Technician

## 2023-03-06 DIAGNOSIS — Z5986 Financial insecurity: Secondary | ICD-10-CM

## 2023-03-06 NOTE — Progress Notes (Signed)
Pharmacy Medication Assistance Program Note    03/06/2023  Patient ID: AFFIE SEABRON, female   DOB: 31-Aug-1955, 67 y.o.   MRN: [id number]     03/06/2023  Outreach Medication One  Initial Outreach Date (Medication One) 03/06/2023  Manufacturer Medication One [employer]  [employer] Eliquis  Dose of Eliquis 5mg   Type of Radiographer, therapeutic Assistance  Date Application Sent to Patient 01/08/2023  Application Items Requested Application;Proof of Income;Proof of Out of Pocket Spend;Other  Date Application Sent to Prescriber 01/08/2023  Name of Prescriber Debbe Odea  Date Application Received From Patient 03/06/2023  Application Items Received From Patient Application;Proof of Income;Proof of Out of Pocket Spend;Other  Date Application Received From Provider 01/30/2023  Date Application Submitted to Manufacturer 03/06/2023  Method Application Sent to Manufacturer Fax       Signature   Kristopher Glee [facility]  Office: [phone] Fax: [phone] Email: [email or web address]

## 2023-03-09 ENCOUNTER — Telehealth: Payer: Self-pay

## 2023-03-09 NOTE — Telephone Encounter (Signed)
Per Dr. Servando Snare ok to see pt. Pt has gotten refills from office but never seen at any GI.

## 2023-03-13 ENCOUNTER — Other Ambulatory Visit: Payer: Self-pay | Admitting: Pharmacy Technician

## 2023-03-13 DIAGNOSIS — Z5986 Financial insecurity: Secondary | ICD-10-CM

## 2023-03-13 NOTE — Progress Notes (Signed)
Pharmacy Medication Assistance Program Note    03/13/2023  Patient ID: Regina Christensen, female   DOB: 06/09/1955, 67 y.o.   MRN: [id number]     03/06/2023 03/13/2023  Outreach Medication One  Initial Outreach Date (Medication One) 03/06/2023   Manufacturer Medication One [employer]   [employer] Eliquis   Dose of Eliquis 5mg    Type of Radiographer, therapeutic Assistance   Date Application Sent to Patient 01/08/2023   Application Items Requested Application;Proof of Income;Proof of Out of Pocket Spend;Other   Date Application Sent to Prescriber 01/08/2023   Name of Prescriber Debbe Odea   Date Application Received From Patient 03/06/2023   Application Items Received From Patient Application;Proof of Income;Proof of Out of Pocket Spend;Other   Date Application Received From Provider 01/30/2023   Date Application Submitted to Manufacturer 03/06/2023   Method Application Sent to Manufacturer Fax   Patient Assistance Determination  Approved  Approval Start Date  03/09/2023  Approval End Date  04/21/2023  Patient Notification Method  Telephone Call  Telephone Call Outcome  Left Voicemail  Additional Outreach Contact  Manufacturer  Contacted Manufacturer  Telephone Call     Care coordination call placed to [employer] in regard to Eliquis application. Spoke to G[employer] who informs patient is APPROVED 03/09/23-04/21/23. She informs medication has shipped with UPS tracking number [id number]. She informs it was delivered to her home today 03/13/23 at 11:55am at the front door. Unsuccessful outreach to patient. HIPAA compliant v/m left requesting return call.  Signature  Kristopher Glee [facility]  Office: [phone] Fax: [phone] Email: [email or web address]

## 2023-03-16 DIAGNOSIS — H40013 Open angle with borderline findings, low risk, bilateral: Secondary | ICD-10-CM | POA: Diagnosis not present

## 2023-03-16 DIAGNOSIS — H2513 Age-related nuclear cataract, bilateral: Secondary | ICD-10-CM | POA: Diagnosis not present

## 2023-03-16 DIAGNOSIS — H2511 Age-related nuclear cataract, right eye: Secondary | ICD-10-CM | POA: Diagnosis not present

## 2023-03-23 ENCOUNTER — Other Ambulatory Visit: Payer: Self-pay | Admitting: Family Medicine

## 2023-03-23 ENCOUNTER — Encounter: Payer: Self-pay | Admitting: Family Medicine

## 2023-03-23 ENCOUNTER — Ambulatory Visit (INDEPENDENT_AMBULATORY_CARE_PROVIDER_SITE_OTHER): Payer: Medicare HMO | Admitting: Family Medicine

## 2023-03-23 VITALS — BP 124/76 | HR 92 | Temp 97.8°F | Resp 16 | Ht 68.0 in | Wt 238.8 lb

## 2023-03-23 DIAGNOSIS — Z79899 Other long term (current) drug therapy: Secondary | ICD-10-CM

## 2023-03-23 DIAGNOSIS — J3489 Other specified disorders of nose and nasal sinuses: Secondary | ICD-10-CM

## 2023-03-23 DIAGNOSIS — K219 Gastro-esophageal reflux disease without esophagitis: Secondary | ICD-10-CM

## 2023-03-23 DIAGNOSIS — R051 Acute cough: Secondary | ICD-10-CM

## 2023-03-23 DIAGNOSIS — Z8673 Personal history of transient ischemic attack (TIA), and cerebral infarction without residual deficits: Secondary | ICD-10-CM

## 2023-03-23 DIAGNOSIS — M25561 Pain in right knee: Secondary | ICD-10-CM | POA: Diagnosis not present

## 2023-03-23 MED ORDER — ACETAMINOPHEN-CODEINE 300-30 MG PO TABS: 1.0000 | ORAL_TABLET | ORAL | 0 refills | Status: DC | PRN

## 2023-03-23 NOTE — Progress Notes (Signed)
Name: Regina Christensen   MRN: [id number]    DOB: 05-Dec-1955   Date:03/23/2023       Progress Note  Subjective  Chief Complaint  Chief Complaint  Patient presents with   Cough    Since 11/28 very constant   Epistaxis    Along yellow/green mucus, concerned since on Eliquis    HPI  Discussed the use of AI scribe software for clinical note transcription with the patient, who gave verbal consent to proceed.  History of Present Illness   The patient, with a history of anticoagulation therapy with Eliquis, presented with symptoms that began on a Friday. Initially, she experienced a cough which she attributed to allergies. However, by the second day, she noticed chest congestion and a running nose. The congestion was particularly noticeable in the morning, but seemed to improve as the day progressed. Despite this, the symptoms persisted and did not improve over time.  The patient described the congestion as being located in the chest, with a postnasal drip that was more pronounced when bending over. The nasal discharge was described as yellow, and occasionally tinged with blood, which was a cause for concern due to her anticoagulation therapy. She reported no shortness of breath, but did note a persistent cough.  In addition to these symptoms, the patient also reported a new issue with her knee. She described swelling and stiffness in the knee that began on a Saturday night, with no history of trauma or twisting. The pain was described as encompassing the whole knee and was not relieved by Tylenol. The knee was also noted to be slightly red and warm to the touch.  The patient was scheduled to see an orthopedic surgeon, Dr. Odis Luster, in two days. She has some gabapentin at home and is asking if she can resume taking it at night   The patient also reported experiencing stress due to the recent death of her sister, which may have contributed to her current symptoms. She expressed concern about  potential cognitive effects of this stress, noting instances of forgetfulness.         Patient Active Problem List   Diagnosis Date Noted   Cerebral vascular disease 03/03/2023   Moderate major depression (HCC) 03/03/2023   Hypercoagulable state due to persistent atrial fibrillation (HCC) 06/26/2022   Abnormal CT of the abdomen 05/06/2022   Primary insomnia 04/25/2022   Chronic combined systolic and diastolic heart failure (HCC) 03/06/2022   HFrEF (heart failure with reduced ejection fraction) (HCC) 03/03/2022   Primary hypertension 03/03/2022   Atrial fibrillation with RVR (HCC) 03/02/2022   Dilated cardiomyopathy (HCC) 03/02/2022   Nonrheumatic tricuspid valve regurgitation 03/02/2022   Osteopenia of necks of both femurs 01/22/2022   History of total bilateral knee replacement 09/10/2021   History of blood transfusion 09/10/2020   Current use of long term anticoagulation    Controlled type 2 diabetes mellitus with microalbuminuria, without long-term current use of insulin (HCC)    GERD without esophagitis    Vitamin D deficiency 04/12/2020   History of CVA (cerebrovascular accident) without residual deficits 03/05/2020   History of hysterectomy 07/19/2019   MDD (major depressive disorder), recurrent, in full remission (HCC) 07/19/2019   Insomnia due to mental condition 07/19/2019   Bilateral carpal tunnel syndrome 07/30/2016   OSA (obstructive sleep apnea) 03/07/2016   BPPV (benign paroxysmal positional vertigo) 08/28/2015   Hypertension associated with type 2 diabetes mellitus (HCC) 11/09/2014   GAD (generalized anxiety disorder) 11/09/2014   Acanthosis nigricans  11/09/2014   Morbid obesity (HCC) 01/06/2012    Past Surgical History:  Procedure Laterality Date   ABDOMINAL HYSTERECTOMY     ATRIAL FIBRILLATION ABLATION N/A 05/29/2022   Procedure: ATRIAL FIBRILLATION ABLATION;  Surgeon: Lanier Prude, MD;  Location: [facility];  Service: Cardiovascular;  Laterality:  N/A;   BREAST EXCISIONAL BIOPSY Left    CARDIOVERSION N/A 03/05/2022   Procedure: CARDIOVERSION;  Surgeon: Debbe Odea, MD;  Location: [facility];  Service: Cardiovascular;  Laterality: N/A;   ENDOMETRIAL ABLATION     x2   ENDOSCOPIC CONCHA BULLOSA RESECTION Bilateral 08/08/2020   Procedure: ENDOSCOPIC CONCHA BULLOSA RESECTION;  Surgeon: Vernie Murders, MD;  Location: [facility];  Service: ENT;  Laterality: Bilateral;   ETHMOIDECTOMY Right 08/08/2020   Procedure: ETHMOIDECTOMY, LEFT PARTIAL ETHMOIDECTOMY;  Surgeon: Vernie Murders, MD;  Location: [facility];  Service: ENT;  Laterality: Right;   IMAGE GUIDED SINUS SURGERY N/A 08/08/2020   Procedure: IMAGE GUIDED SINUS SURGERY, RIGHT FRONTAL SINUSOTOMY;  Surgeon: Vernie Murders, MD;  Location: [facility];  Service: ENT;  Laterality: N/A;   LAPAROSCOPIC SLEEVE GASTRECTOMY     MAXILLARY ANTROSTOMY Bilateral 08/08/2020   Procedure: MAXILLARY ANTROSTOMY with tissue;  Surgeon: Vernie Murders, MD;  Location: [facility];  Service: ENT;  Laterality: Bilateral;   NASAL TURBINATE REDUCTION  08/27/2020   Procedure: TURBINATE REDUCTION /SUBMUCOSAL DEBRIDEMENT;  Surgeon: Vernie Murders, MD;  Location: [facility];  Service: ENT;;   TOTAL KNEE ARTHROPLASTY Right 06/2019   TOTAL KNEE ARTHROPLASTY Left 01/31/2021   [facility]    Family History  Problem Relation Age of Onset   Diabetes Mother    High blood pressure Mother    Heart disease Father    Cancer Brother    Mental illness Neg Hx     Social History   Tobacco Use   Smoking status: Never   Smokeless tobacco: Never  Substance Use Topics   Alcohol use: No    Alcohol/week: 0.0 standard drinks of alcohol    Comment: rare     Current Outpatient Medications:    acetaminophen (TYLENOL) 650 MG CR tablet, Take 650-1,300 mg by mouth every 8 (eight) hours as needed for pain., Disp: , Rfl:    apixaban (ELIQUIS) 5 MG TABS tablet, Take 1 tablet (5 mg total) by mouth 2 (two) times daily.,  Disp: 180 tablet, Rfl: 1   atorvastatin (LIPITOR) 10 MG tablet, TAKE 1 TABLET BY MOUTH ONCE DAILY, Disp: 30 tablet, Rfl: 2   Blood Pressure KIT, 1 Units by Does not apply route daily., Disp: 1 kit, Rfl: 0   famotidine (PEPCID) 20 MG tablet, TAKE 1 TABLET BY MOUTH DAILY AS NEEDED FOR INDIGESTION, Disp: 30 tablet, Rfl: 2   fexofenadine (ALLEGRA) 180 MG tablet, Take 180 mg by mouth daily as needed., Disp: , Rfl:    furosemide (LASIX) 40 MG tablet, TAKE 1 TABLET BY MOUTH DAILY *REFILL REQUEST*, Disp: 90 tablet, Rfl: 10   gabapentin (NEURONTIN) 300 MG capsule, Take 300 mg by mouth 3 (three) times daily as needed (for pain)., Disp: , Rfl:    JARDIANCE 10 MG TABS tablet, TAKE 1 TABLET BY MOUTH BEFORE BREAKFAST DAILY, Disp: 30 tablet, Rfl: 10   meclizine (ANTIVERT) 12.5 MG tablet, Take 1 tablet (12.5 mg total) by mouth 3 (three) times daily as needed for dizziness., Disp: 30 tablet, Rfl: 0   metoprolol succinate (TOPROL-XL) 50 MG 24 hr tablet, TAKE 1 & 1/2 TABLETS BY MOUTH EVERY MORNING AND TAKE 1&  1/2 TABLET BY MOUTH EVERY DAY AT BEDTIME, Disp: 90 tablet, Rfl: 2   Multiple Vitamin (MULTIVITAMIN) tablet, Take 1 tablet by mouth daily. BariatricPal Multivitamin, Disp: , Rfl:    olmesartan (BENICAR) 20 MG tablet, Take 1 tablet (20 mg total) by mouth daily., Disp: 90 tablet, Rfl: 3   pantoprazole (PROTONIX) 40 MG tablet, TAKE 1 TABLET BY MOUTH ONCE DAILY, Disp: 30 tablet, Rfl: 2   potassium chloride (KLOR-CON M) 10 MEQ tablet, TAKE 1 TABLET BY MOUTH ONCE DAILY, Disp: 30 tablet, Rfl: 2   Semaglutide, 2 MG/DOSE, (OZEMPIC, 2 MG/DOSE,) 8 MG/3ML SOPN, INJECT 2MG  SUBCUTANEOUSLY ONCE WEEKLY, Disp: 3 mL, Rfl: 2   spironolactone (ALDACTONE) 25 MG tablet, TAKE 1/2 TABLET BY MOUTH ONCE DAILY, Disp: 15 tablet, Rfl: 10   triamcinolone (NASACORT) 55 MCG/ACT AERO nasal inhaler, Place 2 sprays into the nose daily., Disp: , Rfl:    venlafaxine XR (EFFEXOR-XR) 75 MG 24 hr capsule, TAKE 1 CAPSULE BY MOUTH EVERY MORNING, Disp: 30  capsule, Rfl: 2   Vitamin D, Ergocalciferol, (DRISDOL) 1.25 MG (50000 UNIT) CAPS capsule, TAKE 1 CAPSULE BY MOUTH ONCE WEEKLY *REFILL REQUEST*, Disp: 4 capsule, Rfl: 10  Allergies  Allergen Reactions   Hydrocodone-Acetaminophen Other (See Comments)    Extreme headache   Ace Inhibitors Cough   Hctz [Hydrochlorothiazide] Rash    face    I personally reviewed active problem list, medication list, allergies with the patient/caregiver today.   ROS  Ten systems reviewed and is negative except as mentioned in HPI    Objective  Vitals:   03/23/23 1545  BP: 124/76  Pulse: 92  Resp: 16  Temp: 97.8 F (36.6 C)  TempSrc: Oral  SpO2: 97%  Weight: 238 lb 12.8 oz (108.3 kg)  Height: 5\' 8"  (1.727 m)    Body mass index is 36.31 kg/m.  Physical Exam  Constitutional: Patient appears well-developed and well-nourished. Obese  No distress.  HEENT: head atraumatic, normocephalic, pupils equal and reactive to light, neck supple Cardiovascular: Normal rate, regular rhythm and normal heart sounds.  No murmur heard. Trace  BLE edema. Pulmonary/Chest: Effort normal and breath sounds normal. No respiratory distress. Muscular skeletal: old scar from previous knee replacement surgery, slight increase in warmth, no redness, pain with rom and while walking, no pain with light touch  Abdominal: Soft.  There is no tenderness. Psychiatric: Patient has a normal mood and affect. behavior is normal. Judgment and thought content normal.   Recent Results (from the past 2160 hour(s))  Comprehensive metabolic panel     Status: None   Collection Time: 02/23/23  8:42 AM  Result Value Ref Range   Glucose 88 70 - 99 mg/dL   BUN 10 8 - 27 mg/dL   Creatinine, Ser 5.62 0.57 - 1.00 mg/dL   eGFR 80 >13 YQ/MVH/8.46   BUN/Creatinine Ratio 12 12 - 28   Sodium 143 134 - 144 mmol/L   Potassium 4.1 3.5 - 5.2 mmol/L   Chloride 105 96 - 106 mmol/L   CO2 25 20 - 29 mmol/L   Calcium 9.2 8.7 - 10.3 mg/dL   Total  Protein 6.4 6.0 - 8.5 g/dL   Albumin 4.1 3.9 - 4.9 g/dL   Globulin, Total 2.3 1.5 - 4.5 g/dL   Bilirubin Total 0.4 0.0 - 1.2 mg/dL   Alkaline Phosphatase 116 44 - 121 IU/L   AST 18 0 - 40 IU/L   ALT 13 0 - 32 IU/L  Insulin, random     Status:  None   Collection Time: 02/23/23  8:42 AM  Result Value Ref Range   INSULIN 6.1 2.6 - 24.9 uIU/mL  VITAMIN D 25 Hydroxy (Vit-D Deficiency, Fractures)     Status: None   Collection Time: 02/23/23  8:42 AM  Result Value Ref Range   Vit D, 25-Hydroxy 68.4 30.0 - 100.0 ng/mL    Comment: Vitamin D deficiency has been defined by the Institute of Medicine and an Endocrine Society practice guideline as a level of serum 25-OH vitamin D less than 20 ng/mL (1,2). The Endocrine Society went on to further define vitamin D insufficiency as a level between 21 and 29 ng/mL (2). 1. IOM (Institute of Medicine). 2010. Dietary reference    intakes for calcium and D. [place]: The    [employer]. 2. Holick MF, Binkley [place], Bischoff-Ferrari HA, et al.    Evaluation, treatment, and prevention of vitamin D    deficiency: an Endocrine Society clinical practice    guideline. JCEM. 2011 Jul; 96(7):1911-30.   Hemoglobin A1c     Status: Abnormal   Collection Time: 02/23/23  8:42 AM  Result Value Ref Range   Hgb A1c MFr Bld 6.0 (H) 4.8 - 5.6 %    Comment:          Prediabetes: 5.7 - 6.4          Diabetes: >6.4          Glycemic control for adults with diabetes: <7.0    Est. average glucose Bld gHb Est-mCnc 126 mg/dL  Urine Microalbumin w/creat. ratio     Status: None   Collection Time: 03/03/23  9:00 AM  Result Value Ref Range   Creatinine, Urine 243 20 - 275 mg/dL   Microalb, Ur 1.8 mg/dL    Comment: Reference Range Not established    Microalb Creat Ratio 7 <30 mg/g creat    Comment: . The ADA defines abnormalities in albumin excretion as follows: Marland Kitchen Albuminuria Category        Result (mg/g creatinine) . Normal to Mildly increased    <30 Moderately increased         30-299  Severely increased           > OR = 300 . The ADA recommends that at least two of three specimens collected within a 3-6 month period be abnormal before considering a patient to be within a diagnostic category.   Lipid Profile     Status: None   Collection Time: 03/03/23  9:00 AM  Result Value Ref Range   Cholesterol 124 <200 mg/dL   HDL 77 > OR = 50 mg/dL   Triglycerides 57 <782 mg/dL   LDL Cholesterol (Calc) 34 mg/dL (calc)    Comment: Reference range: <100 . Desirable range <100 mg/dL for primary prevention;   <70 mg/dL for patients with CHD or diabetic patients  with > or = 2 CHD risk factors. Marland Kitchen LDL-C is now calculated using the Martin-Hopkins  calculation, which is a validated novel method providing  better accuracy than the Friedewald equation in the  estimation of LDL-C.  Horald Pollen et al. Lenox Ahr. 9562;130(86): 2061-2068  (http://education.[employer].com/faq/FAQ164)    Total CHOL/HDL Ratio 1.6 <5.0 (calc)   Non-HDL Cholesterol (Calc) 47 <578 mg/dL (calc)    Comment: For patients with diabetes plus 1 major ASCVD risk  factor, treating to a non-HDL-C goal of <100 mg/dL  (LDL-C of <46 mg/dL) is considered a therapeutic  option.       PHQ2/9:  03/23/2023    3:38 PM 03/03/2023    8:05 AM 10/29/2022    8:48 AM 10/28/2022    8:26 AM 09/24/2022    8:39 AM  Depression screen PHQ 2/9  Decreased Interest 0 0 0 0   Down, Depressed, Hopeless 0 0 0 0   PHQ - 2 Score 0 0 0 0   Altered sleeping 0 0 0    Tired, decreased energy 0 0 0    Change in appetite 0 0 0    Feeling bad or failure about yourself  0 0 0    Trouble concentrating 0 0 0    Moving slowly or fidgety/restless 0 0 0    Suicidal thoughts 0 0 0    PHQ-9 Score 0 0 0    Difficult doing work/chores Not difficult at all         Information is confidential and restricted. Go to Review Flowsheets to unlock data.    phq 9 is negative   Fall Risk:    03/23/2023     3:37 PM 03/03/2023    8:05 AM 10/29/2022    8:47 AM 10/28/2022    8:27 AM 06/25/2022    8:20 AM  Fall Risk   Falls in the past year? 0 0 0 0 0  Number falls in past yr: 0 0 0 0 0  Injury with Fall? 0 0 0 0 0  Risk for fall due to : No Fall Risks No Fall Risks No Fall Risks No Fall Risks No Fall Risks  Follow up Falls prevention discussed;Education provided;Falls evaluation completed Falls prevention discussed Falls prevention discussed Falls prevention discussed;Education provided;Falls evaluation completed Falls prevention discussed    Functional Status Survey: Is the patient deaf or have difficulty hearing?: No Does the patient have difficulty seeing, even when wearing glasses/contacts?: Yes Does the patient have difficulty concentrating, remembering, or making decisions?: No Does the patient have difficulty walking or climbing stairs?: No Does the patient have difficulty dressing or bathing?: No Does the patient have difficulty doing errands alone such as visiting a doctor's office or shopping?: No    Assessment & Plan  Assessment and Plan    Upper Respiratory Infection Symptoms of cough, chest congestion, and nasal drainage with yellow sputum. No shortness of breath or fatigue. -Prescribe Tylenol #3 for pain and cough suppression. -Advise use of Coricidin HBP for symptom relief. -Recommend saline mist for nasal congestion, avoid nasal steroids  due to risk of bleeding. -Advise patient to perform a home COVID-19 test to rule out infection.  Epistaxis Minor nosebleeds with blood-tinged mucus, likely due to anticoagulation with Eliquis. -Avoid blowing nose and using nasal corticosteroides  to prevent further bleeding. -Use saline mist for nasal congestion.  Knee Pain and Swelling Acute onset of knee pain and swelling without history of trauma. No significant redness or heat on examination. -Resume  gabapentin qpm r pain. -Prescribe Tylenol #3 for pain relief. -Advise patient to  keep appointment with Dr. Odis Luster for further evaluation and management. -Recommend use of topical Voltaren for pain relief, avoid NSAIDs due to anticoagulation with Eliquis.  General Health Maintenance / Follow up Plans -Advise patient to continue with scheduled knee surgery appointment with Dr. Odis Luster.

## 2023-03-25 ENCOUNTER — Encounter: Payer: Self-pay | Admitting: Family Medicine

## 2023-03-25 DIAGNOSIS — T8484XA Pain due to internal orthopedic prosthetic devices, implants and grafts, initial encounter: Secondary | ICD-10-CM | POA: Diagnosis not present

## 2023-03-26 ENCOUNTER — Other Ambulatory Visit: Payer: Self-pay | Admitting: Orthopedic Surgery

## 2023-03-26 DIAGNOSIS — T8484XA Pain due to internal orthopedic prosthetic devices, implants and grafts, initial encounter: Secondary | ICD-10-CM

## 2023-03-31 ENCOUNTER — Ambulatory Visit (INDEPENDENT_AMBULATORY_CARE_PROVIDER_SITE_OTHER): Payer: Medicare HMO | Admitting: Adult Health

## 2023-03-31 ENCOUNTER — Encounter (INDEPENDENT_AMBULATORY_CARE_PROVIDER_SITE_OTHER): Payer: Self-pay | Admitting: Adult Health

## 2023-03-31 VITALS — BP 124/81 | HR 70 | Temp 98.0°F | Ht 68.0 in | Wt 238.0 lb

## 2023-03-31 DIAGNOSIS — E559 Vitamin D deficiency, unspecified: Secondary | ICD-10-CM | POA: Diagnosis not present

## 2023-03-31 DIAGNOSIS — E1159 Type 2 diabetes mellitus with other circulatory complications: Secondary | ICD-10-CM

## 2023-03-31 DIAGNOSIS — E669 Obesity, unspecified: Secondary | ICD-10-CM

## 2023-03-31 DIAGNOSIS — I152 Hypertension secondary to endocrine disorders: Secondary | ICD-10-CM | POA: Diagnosis not present

## 2023-03-31 DIAGNOSIS — Z7985 Long-term (current) use of injectable non-insulin antidiabetic drugs: Secondary | ICD-10-CM | POA: Diagnosis not present

## 2023-03-31 DIAGNOSIS — Z7984 Long term (current) use of oral hypoglycemic drugs: Secondary | ICD-10-CM | POA: Diagnosis not present

## 2023-03-31 DIAGNOSIS — Z6836 Body mass index (BMI) 36.0-36.9, adult: Secondary | ICD-10-CM

## 2023-03-31 DIAGNOSIS — E1169 Type 2 diabetes mellitus with other specified complication: Secondary | ICD-10-CM

## 2023-03-31 DIAGNOSIS — F4321 Adjustment disorder with depressed mood: Secondary | ICD-10-CM | POA: Diagnosis not present

## 2023-03-31 DIAGNOSIS — E66813 Obesity, class 3: Secondary | ICD-10-CM

## 2023-03-31 MED ORDER — VITAMIN D (ERGOCALCIFEROL) 1.25 MG (50000 UNIT) PO CAPS: 50000.0000 [IU] | ORAL_CAPSULE | ORAL | 0 refills | Status: DC

## 2023-03-31 NOTE — Progress Notes (Signed)
WEIGHT SUMMARY AND BIOMETRICS  Vitals Temp: 98 F (36.7 C) BP: 124/81 Pulse Rate: 70 SpO2: 98 %   Anthropometric Measurements Height: 5\' 8"  (1.727 m) Weight: 238 lb (108 kg) BMI (Calculated): 36.2 Weight at Last Visit: 236 lb Weight Lost Since Last Visit: 0 Weight Gained Since Last Visit: 2 lb Starting Weight: 277 lb Total Weight Loss (lbs): 39 lb (17.7 kg)   Body Composition  Body Fat %: 46.3 % Fat Mass (lbs): 110.4 lbs Muscle Mass (lbs): 121.6 lbs Total Body Water (lbs): 91 lbs Visceral Fat Rating : 14   Other Clinical Data Fasting: yes Labs: no Today's Visit #: 29 Starting Date: 05/10/20    Chief Complaint:   OBESITY Regina Christensen is here to discuss her progress with her obesity treatment plan. She is on the the Category 1 Plan and states she is following her eating plan approximately 65 % of the time.  She states she is not currently exercising.   Interim History:  Her 2 year old sister, Regina Christensen, passed away from complications of CVA. She passed at end of Nov 2024. The family laid her to rest 03/24/2023 Regina Christensen endorses increased snacking since her sister sudden death  She endorses depressed mood, denies SI/HI  Subjective:   1. Vitamin D deficiency Discussed Labs  Latest Reference Range & Units 02/23/23 08:42  Vitamin D, 25-Hydroxy 30.0 - 100.0 ng/mL 68.4   Level therapeutic She is on weekly Ergocalciferol- denies N/V/Muscle Weakness She is agreeable to reducing from weekly to bi-weekly dosing   2. Type 2 diabetes mellitus with other specified complication, without long-term current use of insulin (HCC) Discussed Labs  Latest Reference Range & Units 02/23/23 08:42  Glucose 70 - 99 mg/dL 88  Hemoglobin J4N 4.8 - 5.6 % 6.0 (H)  Est. average glucose Bld gHb Est-mCnc mg/dL 829  INSULIN 2.6 - 56.2 uIU/mL 6.1  (H): Data is abnormally high  CBG, A1c, and Insulin levels all at goa Fasting CBG will range from mid 90s to low 100s She denies sx's  of hypoglycemia PCP manages Semaglutide, 2 MG/DOSE, (OZEMPIC, 2 MG/DOSE,) 8 MG/3ML SOPN  JARDIANCE 10 MG TABS tablet   3. Hypertension associated with type 2 diabetes mellitus (HCC) Discussed Labs 02/23/2023 CMP- Electrolytes, Kidney/liver enzymes- all stable BP at goal at OV PCP manages olmesartan (BENICAR) 20 MG tablet  furosemide (LASIX) 40 MG tablet  metoprolol succinate (TOPROL-XL) 50 MG 24 hr tablet  spironolactone (ALDACTONE) 25 MG tablet  potassium chloride (KLOR-CON M) 10 MEQ tablet   4. Grief reaction Her 73 year old sister, Regina Christensen, passed away from complications of CVA. She passed at end of Nov 2024. The family laid her to rest 03/24/2023 Regina Christensen endorses increased snacking since her sister sudden death  She reports excellent support- local family/friends/pastor  Assessment/Plan:   1. Vitamin D deficiency Refill and DECREASE - Vitamin D, Ergocalciferol, (DRISDOL) 1.25 MG (50000 UNIT) CAPS capsule; Take 1 capsule (50,000 Units total) by mouth every 14 (fourteen) days.  Dispense: 4 capsule; Refill: 0  2. Type 2 diabetes mellitus with other specified complication, without long-term current use of insulin (HCC) Continue  Semaglutide, 2 MG/DOSE, (OZEMPIC, 2 MG/DOSE,) 8 MG/3ML SOPN  JARDIANCE 10 MG TABS tablet   3. Hypertension associated with type 2 diabetes mellitus (HCC) Continue olmesartan (BENICAR) 20 MG tablet  furosemide (LASIX) 40 MG tablet  metoprolol succinate (TOPROL-XL) 50 MG 24 hr tablet  spironolactone (ALDACTONE) 25 MG tablet  potassium chloride (KLOR-CON M) 10 MEQ  tablet   4. Grief reaction Increase daily activity Surround self with positive family/friends  5. Obesity, current BMI 36.2  Regina Christensen is currently in the action stage of change. As such, her goal is to maintain weight for now. She has agreed to the Category 1 Plan.   Exercise goals: Older adults should follow the adult guidelines. When older adults cannot meet the adult guidelines,  they should be as physically active as their abilities and conditions will allow.  Older adults should do exercises that maintain or improve balance if they are at risk of falling.  Older adults should determine their level of effort for physical activity relative to their level of fitness.  Older adults with chronic conditions should understand whether and how their conditions affect their ability to do regular physical activity safely.  Behavioral modification strategies: increasing lean protein intake, decreasing simple carbohydrates, increasing vegetables, increasing water intake, meal planning and cooking strategies, keeping healthy foods in the home, ways to avoid boredom eating, ways to avoid night time snacking, holiday eating strategies , and planning for success.  Regina Christensen has agreed to follow-up with our clinic in 4 weeks. She was informed of the importance of frequent follow-up visits to maximize her success with intensive lifestyle modifications for her multiple health conditions.   Objective:   Blood pressure 124/81, pulse 70, temperature 98 F (36.7 C), height 5\' 8"  (1.727 m), weight 238 lb (108 kg), SpO2 98%. Body mass index is 36.19 kg/m.  General: Cooperative, alert, well developed, in no acute distress. HEENT: Conjunctivae and lids unremarkable. Cardiovascular: Regular rhythm.  Lungs: Normal work of breathing. Neurologic: No focal deficits.   Lab Results  Component Value Date   CREATININE 0.81 02/23/2023   BUN 10 02/23/2023   NA 143 02/23/2023   K 4.1 02/23/2023   CL 105 02/23/2023   CO2 25 02/23/2023   Lab Results  Component Value Date   ALT 13 02/23/2023   AST 18 02/23/2023   ALKPHOS 116 02/23/2023   BILITOT 0.4 02/23/2023   Lab Results  Component Value Date   HGBA1C 6.0 (H) 02/23/2023   HGBA1C 5.4 10/29/2022   HGBA1C 6.1 (A) 06/25/2022   HGBA1C 6.0 (H) 02/10/2022   HGBA1C 5.8 (H) 08/28/2021   Lab Results  Component Value Date   INSULIN 6.1  02/23/2023   INSULIN 8.0 08/28/2021   Lab Results  Component Value Date   TSH 1.359 03/02/2022   Lab Results  Component Value Date   CHOL 124 03/03/2023   HDL 77 03/03/2023   LDLCALC 34 03/03/2023   TRIG 57 03/03/2023   CHOLHDL 1.6 03/03/2023   Lab Results  Component Value Date   VD25OH 68.4 02/23/2023   VD25OH 60 02/10/2022   VD25OH 38.1 08/28/2021   Lab Results  Component Value Date   WBC 7.0 05/14/2022   HGB 14.6 05/14/2022   HCT 44.9 05/14/2022   MCV 89.6 05/14/2022   PLT 291 05/14/2022   Lab Results  Component Value Date   IRON 39 11/14/2020   TIBC 329 11/14/2020   FERRITIN 25 11/14/2020    Attestation Statements:   Reviewed by clinician on day of visit: allergies, medications, problem list, medical history, surgical history, family history, social history, and previous encounter notes.  I have reviewed the above documentation for accuracy and completeness, and I agree with the above. -  Kurtis Anastasia d. Jashayla Glatfelter, NP-C

## 2023-04-02 ENCOUNTER — Encounter
Admission: RE | Admit: 2023-04-02 | Discharge: 2023-04-02 | Disposition: A | Discharge status: Home / Self Care | Payer: Medicare HMO | Source: Ambulatory Visit | Attending: Orthopedic Surgery | Admitting: Orthopedic Surgery

## 2023-04-02 DIAGNOSIS — M25569 Pain in unspecified knee: Secondary | ICD-10-CM | POA: Diagnosis not present

## 2023-04-02 DIAGNOSIS — T8484XA Pain due to internal orthopedic prosthetic devices, implants and grafts, initial encounter: Secondary | ICD-10-CM

## 2023-04-02 DIAGNOSIS — Z96642 Presence of left artificial hip joint: Secondary | ICD-10-CM | POA: Diagnosis not present

## 2023-04-02 DIAGNOSIS — Z96653 Presence of artificial knee joint, bilateral: Secondary | ICD-10-CM | POA: Diagnosis not present

## 2023-04-02 MED ORDER — TECHNETIUM TC 99M MEDRONATE IV KIT
20.0000 | PACK | Freq: Once | INTRAVENOUS | Status: AC | PRN
Start: 2023-04-02 — End: 2023-04-02
  Administered 2023-04-02: 21.34 via INTRAVENOUS

## 2023-04-08 ENCOUNTER — Encounter: Payer: Self-pay | Admitting: Family Medicine

## 2023-04-08 NOTE — Telephone Encounter (Signed)
 Care team updated and letter sent for eye exam notes.

## 2023-04-10 ENCOUNTER — Other Ambulatory Visit (INDEPENDENT_AMBULATORY_CARE_PROVIDER_SITE_OTHER): Payer: Self-pay | Admitting: Adult Health

## 2023-04-10 DIAGNOSIS — E559 Vitamin D deficiency, unspecified: Secondary | ICD-10-CM

## 2023-04-16 ENCOUNTER — Other Ambulatory Visit: Payer: Self-pay | Admitting: Family Medicine

## 2023-04-16 DIAGNOSIS — R600 Localized edema: Secondary | ICD-10-CM

## 2023-04-21 ENCOUNTER — Ambulatory Visit: Payer: Medicare HMO | Admitting: Gastroenterology

## 2023-04-21 VITALS — BP 151/84 | HR 76 | Temp 98.5°F | Ht 68.0 in | Wt 245.0 lb

## 2023-04-21 DIAGNOSIS — K580 Irritable bowel syndrome with diarrhea: Secondary | ICD-10-CM | POA: Diagnosis not present

## 2023-04-21 DIAGNOSIS — R194 Change in bowel habit: Secondary | ICD-10-CM

## 2023-04-21 DIAGNOSIS — R197 Diarrhea, unspecified: Secondary | ICD-10-CM

## 2023-04-21 MED ORDER — NA SULFATE-K SULFATE-MG SULF 17.5-3.13-1.6 GM/177ML PO SOLN: 1.0000 | Freq: Once | ORAL | 0 refills | Status: AC

## 2023-04-21 NOTE — Addendum Note (Signed)
 Addended by: Roena Malady on: 04/21/2023 05:07 PM   Modules accepted: Orders

## 2023-04-21 NOTE — Progress Notes (Signed)
 Gastroenterology Consultation  Referring Provider:     Sowles, Krichna, MD Primary Care Physician:  Sowles, Krichna, MD Primary Gastroenterologist:  Dr. Jinny     Reason for Consultation:     Change in bowel habits        HPI:   Regina Christensen is a 67 y.o. y/o female referred for consultation & management of change in bowel habits by Dr. Glenard, Krichna, MD. This patient comes in today after being seen by Dr. Janalyn back in June and September 2020.  At that time the patient was having symptoms of GERD and per the last note was switched from a PPI to an H2 blocker.  The patient had seen her primary care provider back in November and reported that she had a history of IBS-D but states that her symptoms have been getting worse with stool incontinence. The patient reports that she has had worsening diarrhea.  She states that the diarrhea has been more prevalent recently and that is why she is concerned.  There has not been any unexplained weight loss fevers chills nausea vomiting.  The patient also states that sometimes 1 food will bother her and then the same food the next day will not bother her. The patient also denies any black stools or bloody stools.  She was concerned because the symptoms have changed from her previous irritable bowel syndrome problems.  Past Medical History:  Diagnosis Date   Anxiety    Arthritis    Back pain    BPPV (benign paroxysmal positional vertigo)    CHF (congestive heart failure) (HCC)    Chronic diastolic HF (heart failure) (HCC)    Complication of anesthesia    Post-operative hypoxia requiring supplemental oxygen   Current use of long term anticoagulation    Apixaban    CVA (cerebral vascular accident) (HCC) 02/28/2020   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; imaging from [facility]   Depression    Edema of both lower extremities    GERD (gastroesophageal reflux disease)    Hypertension    IBS (irritable bowel syndrome)    Lactose intolerance     Morbid obesity with BMI of 45.0-49.9, adult (HCC)    Obesity    Osteoarthritis    PAF (paroxysmal atrial fibrillation) (HCC)    Pre-diabetes    Sleep apnea    uses CPAP machine sometimes    Type 2 diabetes mellitus without complication (HCC)    Vitamin D  deficiency     Past Surgical History:  Procedure Laterality Date   ABDOMINAL HYSTERECTOMY     ATRIAL FIBRILLATION ABLATION N/A 05/29/2022   Procedure: ATRIAL FIBRILLATION ABLATION;  Surgeon: Cindie Ole DASEN, MD;  Location: [facility];  Service: Cardiovascular;  Laterality: N/A;   BREAST EXCISIONAL BIOPSY Left    CARDIOVERSION N/A 03/05/2022   Procedure: CARDIOVERSION;  Surgeon: Darliss Rogue, MD;  Location: [facility];  Service: Cardiovascular;  Laterality: N/A;   ENDOMETRIAL ABLATION     x2   ENDOSCOPIC CONCHA BULLOSA RESECTION Bilateral 08/08/2020   Procedure: ENDOSCOPIC CONCHA BULLOSA RESECTION;  Surgeon: Edda Mt, MD;  Location: [facility];  Service: ENT;  Laterality: Bilateral;   ETHMOIDECTOMY Right 08/08/2020   Procedure: ETHMOIDECTOMY, LEFT PARTIAL ETHMOIDECTOMY;  Surgeon: Edda Mt, MD;  Location: [facility];  Service: ENT;  Laterality: Right;   IMAGE GUIDED SINUS SURGERY N/A 08/08/2020   Procedure: IMAGE GUIDED SINUS SURGERY, RIGHT FRONTAL SINUSOTOMY;  Surgeon: Edda Mt, MD;  Location: [facility];  Service: ENT;  Laterality: N/A;   LAPAROSCOPIC SLEEVE GASTRECTOMY     MAXILLARY ANTROSTOMY Bilateral 08/08/2020   Procedure: MAXILLARY ANTROSTOMY with tissue;  Surgeon: Edda Mt, MD;  Location: [facility];  Service: ENT;  Laterality: Bilateral;   NASAL TURBINATE REDUCTION  08/27/2020   Procedure: TURBINATE REDUCTION /SUBMUCOSAL DEBRIDEMENT;  Surgeon: Juengel, Paul, MD;  Location: [facility];  Service: ENT;;   TOTAL KNEE ARTHROPLASTY Right 06/2019   TOTAL KNEE ARTHROPLASTY Left 01/31/2021     [facility]    Prior to Admission medications   Medication Sig Start Date End Date Taking?  Authorizing Provider  acetaminophen -codeine  (TYLENOL  #3) 300-30 MG tablet Take 1 tablet by mouth every 4 (four) hours as needed for moderate pain (pain score 4-6). 03/23/23   Sowles, Krichna, MD  apixaban  (ELIQUIS ) 5 MG TABS tablet Take 1 tablet (5 mg total) by mouth 2 (two) times daily. 12/23/22   Darliss Rogue, MD  atorvastatin  (LIPITOR) 10 MG tablet TAKE 1 TABLET BY MOUTH ONCE DAILY 03/24/23   Sowles, Krichna, MD  Blood Pressure KIT 1 Units by Does not apply route daily. 01/26/23   Danford, Rockie D, NP  famotidine  (PEPCID ) 20 MG tablet TAKE 1 TABLET BY MOUTH DAILY AS NEEDED FOR INDIGESTION 12/23/22   Glenard, Krichna, MD  fexofenadine (ALLEGRA) 180 MG tablet Take 180 mg by mouth daily as needed.    [provider]  furosemide  (LASIX ) 40 MG tablet TAKE 1 TABLET BY MOUTH DAILY *REFILL REQUEST* 12/23/22   Sowles, Krichna, MD  gabapentin  (NEURONTIN ) 300 MG capsule Take 300 mg by mouth 3 (three) times daily as needed (for pain).    [provider]  JARDIANCE  10 MG TABS tablet TAKE 1 TABLET BY MOUTH BEFORE BREAKFAST DAILY 12/23/22   [place]ette [place] A, FNP  meclizine  (ANTIVERT ) 12.5 MG tablet Take 1 tablet (12.5 mg total) by mouth 3 (three) times daily as needed for dizziness. 06/25/22   Sowles, Krichna, MD  metoprolol  succinate (TOPROL -XL) 50 MG 24 hr tablet TAKE 1 & 1/2 TABLETS BY MOUTH EVERY MORNING AND TAKE 1& 1/2 TABLET BY MOUTH EVERY DAY AT BEDTIME 12/24/22   Cindie Ole DASEN, MD  Multiple Vitamin (MULTIVITAMIN) tablet Take 1 tablet by mouth daily. BariatricPal Multivitamin    [provider]  olmesartan  (BENICAR ) 20 MG tablet Take 1 tablet (20 mg total) by mouth daily. 10/07/22   Darliss Rogue, MD  pantoprazole  (PROTONIX ) 40 MG tablet TAKE 1 TABLET BY MOUTH ONCE DAILY 03/24/23   Sowles, Krichna, MD  potassium chloride  (KLOR-CON  M) 10 MEQ tablet TAKE 1 TABLET BY MOUTH ONCE DAILY 04/16/23   Sowles, Krichna, MD  Semaglutide , 2 MG/DOSE, (OZEMPIC , 2 MG/DOSE,) 8 MG/3ML SOPN INJECT 2MG   SUBCUTANEOUSLY ONCE WEEKLY 12/23/22   Sowles, Krichna, MD  spironolactone  (ALDACTONE ) 25 MG tablet TAKE 1/2 TABLET BY MOUTH ONCE DAILY 12/23/22   [place] A, FNP  triamcinolone  (NASACORT ) 55 MCG/ACT AERO nasal inhaler Place 2 sprays into the nose daily.    [provider]  venlafaxine  XR (EFFEXOR -XR) 75 MG 24 hr capsule TAKE 1 CAPSULE BY MOUTH EVERY MORNING 02/06/23   Eappen, Saramma, MD  Vitamin D , Ergocalciferol , (DRISDOL ) 1.25 MG (50000 UNIT) CAPS capsule Take 1 capsule (50,000 Units total) by mouth every 14 (fourteen) days. 03/31/23   Jonel Rockie D, NP    Family History  Problem Relation Age of Onset   Diabetes Mother    High blood pressure Mother    Heart disease Father    Stroke Sister    Cancer Brother  Mental illness Neg Hx      Social History   Tobacco Use   Smoking status: Never   Smokeless tobacco: Never  Vaping Use   Vaping status: Never Used  Substance Use Topics   Alcohol use: No    Alcohol/week: 0.0 standard drinks of alcohol    Comment: rare   Drug use: No    Allergies as of 04/21/2023 - Review Complete 04/21/2023  Allergen Reaction Noted   Hydrocodone -acetaminophen  Other (See Comments) 08/27/2020   Ace inhibitors Cough 11/30/2015   Hctz [hydrochlorothiazide ] Rash 06/20/2016    Review of Systems:    All systems reviewed and negative except where noted in HPI.   Physical Exam:  There were no vitals taken for this visit. No LMP recorded. Patient has had a hysterectomy. General:   Alert,  Well-developed, well-nourished, pleasant and cooperative in NAD Head:  Normocephalic and atraumatic. Eyes:  Sclera clear, no icterus.   Conjunctiva pink. Rectal:  Deferred.  Pulses:  Normal pulses noted. Skin:  Intact without significant lesions or rashes.  No jaundice. Lymph Nodes:  No significant cervical adenopathy. Psych:  Alert and cooperative. Normal mood and affect.  Imaging Studies: NM Bone Scan 3 Phase Result Date: 04/13/2023 CLINICAL DATA:   Bilateral knee replacements.  Knee pain. EXAM: NUCLEAR MEDICINE 3-PHASE BONE SCAN TECHNIQUE: Radionuclide angiographic images, immediate static blood pool images, and 3-hour delayed static images were obtained of the knees after intravenous injection of radiopharmaceutical. RADIOPHARMACEUTICALS:  21.4 mCi Tc-50m MDP IV COMPARISON:  None Available. FINDINGS: Vascular phase: No asymmetric or increased blood flow to the LEFT or RIGHT knee. Blood pool phase: Photopenia noted associated with the knee prosthetics. There is mild increased uptake associated with lateral LEFT femoral condyle above the prosthetic Delayed phase: Focal uptake in the lateral aspect of the LEFT femoral condyle IMPRESSION: Delayed phase activity in the lateral aspect of the LEFT femoral condyle. Finding could represent early loosening. Electronically Signed   By: Jackquline Boxer M.D.   On: 04/13/2023 16:47    Assessment and Plan:   Regina Christensen is a 67 y.o. y/o female who comes in today with a history of IBS that has been diarrhea predominant with worsening symptoms recently.  The patient had a colonoscopy approximately 8 years ago without any abnormalities found at that time.  Since the patient has had a dramatic change in her symptoms and it has been so long since her last colonoscopy she will be set up for repeat colonoscopy to rule out any other cause of her diarrhea.  The patient has also been told to take Imodium as needed if she wants to go out of the house to avoid having any accidents.  The patient has been encouraged to take liquid Imodium so she can titrate the dose to a dose that is effective for her.  The patient has been explained the plan and agrees with it.    Rogelia Copping, MD. [facility]    Note: This dictation was prepared with Dragon dictation along with smaller phrase technology. Any transcriptional errors that result from this process are unintentional.

## 2023-04-23 ENCOUNTER — Telehealth: Payer: Self-pay | Admitting: Cardiology

## 2023-04-23 DIAGNOSIS — I480 Paroxysmal atrial fibrillation: Secondary | ICD-10-CM

## 2023-04-23 MED ORDER — APIXABAN 5 MG PO TABS: 5.0000 mg | ORAL_TABLET | Freq: Two times a day (BID) | ORAL | 3 refills | Status: DC

## 2023-04-23 NOTE — Telephone Encounter (Signed)
 Spoke with patient and she reports that someone from pharmacy assisted her with application. Will complete new prescription and fax to company

## 2023-04-23 NOTE — Telephone Encounter (Signed)
 Prescription faxed to company

## 2023-04-23 NOTE — Telephone Encounter (Signed)
 Pt c/o medication issue:  1. Name of Medication:  apixaban  (ELIQUIS ) 5 MG TABS tablet  2. How are you currently taking this medication (dosage and times per day)?   3. Are you having a reaction (difficulty breathing--STAT)?   4. What is your medication issue?    Janessa with [employer] states patient assistance application has missing information + Rx is signed, 2023. She is requesting to have this information updated.  fax#: [phone]

## 2023-05-06 ENCOUNTER — Ambulatory Visit: Payer: Medicare (Managed Care) | Admitting: Family Medicine

## 2023-05-06 ENCOUNTER — Encounter: Payer: Self-pay | Admitting: Family Medicine

## 2023-05-06 VITALS — BP 142/86 | HR 86 | Resp 16 | Ht 68.0 in | Wt 243.8 lb

## 2023-05-06 DIAGNOSIS — T691XXA Chilblains, initial encounter: Secondary | ICD-10-CM | POA: Diagnosis not present

## 2023-05-06 DIAGNOSIS — J0100 Acute maxillary sinusitis, unspecified: Secondary | ICD-10-CM

## 2023-05-06 MED ORDER — AZITHROMYCIN 500 MG PO TABS: 500.0000 mg | ORAL_TABLET | Freq: Every day | ORAL | 0 refills | Status: DC

## 2023-05-06 MED ORDER — TRIAMCINOLONE ACETONIDE 0.1 % EX CREA: 1.0000 | TOPICAL_CREAM | Freq: Two times a day (BID) | CUTANEOUS | 0 refills | Status: DC

## 2023-05-06 NOTE — Progress Notes (Signed)
 Name: Regina Christensen   MRN: [id number]    DOB: 1955-06-11   Date:05/06/2023       Progress Note  Subjective  Chief Complaint  Chief Complaint  Patient presents with   Insect Bite    On left great toe, feels like the itching its spreading to the other toes onset for weeks   Discussed the use of AI scribe software for clinical note transcription with the patient, who gave verbal consent to proceed.  History of Present Illness   The patient presents with a two-week history of itchy, erythematous bumps on the toes. The bumps initially appeared on two toes and a new one developed the day before the consultation. The patient denies any recent pedicure or trauma to the area. The itching is severe enough to wake the patient at night. The patient has been applying over-the-counter cortisone cream with minimal relief. The patient also reports that her feet often get cold, especially at night, and she has been wearing socks to bed to keep them warm. She also works from home and does not wear shoes, feet has been feeling cold during the day  In addition to the skin issue, the patient reports a recurrence of sinus symptoms. She has been experiencing green nasal discharge and postnasal drip for the past six weeks. The discharge has a foul smell that is bothersome to the patient. The patient denies any associated fever or other systemic symptoms. She has been using saline spray for symptom management. The patient has not taken any antibiotics for this issue recently.       Patient Active Problem List   Diagnosis Date Noted   Erythema pernio 05/06/2023   Cerebral vascular disease 03/03/2023   Moderate major depression (HCC) 03/03/2023   Hypercoagulable state due to persistent atrial fibrillation (HCC) 06/26/2022   Abnormal CT of the abdomen 05/06/2022   Primary insomnia 04/25/2022   Chronic combined systolic and diastolic heart failure (HCC) 03/06/2022   HFrEF (heart failure with reduced ejection  fraction) (HCC) 03/03/2022   Primary hypertension 03/03/2022   Atrial fibrillation with RVR (HCC) 03/02/2022   Dilated cardiomyopathy (HCC) 03/02/2022   Nonrheumatic tricuspid valve regurgitation 03/02/2022   Osteopenia of necks of both femurs 01/22/2022   History of total bilateral knee replacement 09/10/2021   History of blood transfusion 09/10/2020   Current use of long term anticoagulation    Controlled type 2 diabetes mellitus with microalbuminuria, without long-term current use of insulin  (HCC)    GERD without esophagitis    Vitamin D  deficiency 04/12/2020   History of CVA (cerebrovascular accident) without residual deficits 03/05/2020   History of hysterectomy 07/19/2019   MDD (major depressive disorder), recurrent, in full remission (HCC) 07/19/2019   Insomnia due to mental condition 07/19/2019   Bilateral carpal tunnel syndrome 07/30/2016   OSA (obstructive sleep apnea) 03/07/2016   BPPV (benign paroxysmal positional vertigo) 08/28/2015   Hypertension associated with type 2 diabetes mellitus (HCC) 11/09/2014   GAD (generalized anxiety disorder) 11/09/2014   Acanthosis nigricans 11/09/2014   Morbid obesity (HCC) 01/06/2012    Past Surgical History:  Procedure Laterality Date   ABDOMINAL HYSTERECTOMY     ATRIAL FIBRILLATION ABLATION N/A 05/29/2022   Procedure: ATRIAL FIBRILLATION ABLATION;  Surgeon: Boyce Byes, MD;  Location: [facility];  Service: Cardiovascular;  Laterality: N/A;   BREAST EXCISIONAL BIOPSY Left    CARDIOVERSION N/A 03/05/2022   Procedure: CARDIOVERSION;  Surgeon: Constancia Delton, MD;  Location: [facility];  Service: Cardiovascular;  Laterality: N/A;   ENDOMETRIAL ABLATION     x2   ENDOSCOPIC CONCHA BULLOSA RESECTION Bilateral 08/08/2020   Procedure: ENDOSCOPIC CONCHA BULLOSA RESECTION;  Surgeon: Mellody Sprout, MD;  Location: [facility];  Service: ENT;  Laterality: Bilateral;   ETHMOIDECTOMY Right 08/08/2020   Procedure: ETHMOIDECTOMY, LEFT  PARTIAL ETHMOIDECTOMY;  Surgeon: Mellody Sprout, MD;  Location: [facility];  Service: ENT;  Laterality: Right;   IMAGE GUIDED SINUS SURGERY N/A 08/08/2020   Procedure: IMAGE GUIDED SINUS SURGERY, RIGHT FRONTAL SINUSOTOMY;  Surgeon: Mellody Sprout, MD;  Location: [facility];  Service: ENT;  Laterality: N/A;   LAPAROSCOPIC SLEEVE GASTRECTOMY     MAXILLARY ANTROSTOMY Bilateral 08/08/2020   Procedure: MAXILLARY ANTROSTOMY with tissue;  Surgeon: Mellody Sprout, MD;  Location: [facility];  Service: ENT;  Laterality: Bilateral;   NASAL TURBINATE REDUCTION  08/27/2020   Procedure: TURBINATE REDUCTION /SUBMUCOSAL DEBRIDEMENT;  Surgeon: Mellody Sprout, MD;  Location: [facility];  Service: ENT;;   TOTAL KNEE ARTHROPLASTY Right 06/2019   TOTAL KNEE ARTHROPLASTY Left 01/31/2021   [facility]    Family History  Problem Relation Age of Onset   Diabetes Mother    High blood pressure Mother    Heart disease Father    Stroke Sister    Cancer Brother    Mental illness Neg Hx     Social History   Tobacco Use   Smoking status: Never   Smokeless tobacco: Never  Substance Use Topics   Alcohol use: No    Alcohol/week: 0.0 standard drinks of alcohol    Comment: rare     Current Outpatient Medications:    acetaminophen -codeine  (TYLENOL  #3) 300-30 MG tablet, Take 1 tablet by mouth every 4 (four) hours as needed for moderate pain (pain score 4-6)., Disp: 30 tablet, Rfl: 0   apixaban  (ELIQUIS ) 5 MG TABS tablet, Take 1 tablet (5 mg total) by mouth 2 (two) times daily., Disp: 180 tablet, Rfl: 3   atorvastatin  (LIPITOR) 10 MG tablet, TAKE 1 TABLET BY MOUTH ONCE DAILY, Disp: 90 tablet, Rfl: 0   azithromycin  (ZITHROMAX ) 500 MG tablet, Take 1 tablet (500 mg total) by mouth daily., Disp: 3 tablet, Rfl: 0   Blood Pressure KIT, 1 Units by Does not apply route daily., Disp: 1 kit, Rfl: 0   famotidine  (PEPCID ) 20 MG tablet, TAKE 1 TABLET BY MOUTH DAILY AS NEEDED FOR INDIGESTION, Disp: 30 tablet, Rfl: 2    fexofenadine (ALLEGRA) 180 MG tablet, Take 180 mg by mouth daily as needed., Disp: , Rfl:    furosemide  (LASIX ) 40 MG tablet, TAKE 1 TABLET BY MOUTH DAILY *REFILL REQUEST*, Disp: 90 tablet, Rfl: 10   gabapentin  (NEURONTIN ) 300 MG capsule, Take 300 mg by mouth 3 (three) times daily as needed (for pain)., Disp: , Rfl:    JARDIANCE  10 MG TABS tablet, TAKE 1 TABLET BY MOUTH BEFORE BREAKFAST DAILY, Disp: 30 tablet, Rfl: 10   meclizine  (ANTIVERT ) 12.5 MG tablet, Take 1 tablet (12.5 mg total) by mouth 3 (three) times daily as needed for dizziness., Disp: 30 tablet, Rfl: 0   metoprolol  succinate (TOPROL -XL) 50 MG 24 hr tablet, TAKE 1 & 1/2 TABLETS BY MOUTH EVERY MORNING AND TAKE 1& 1/2 TABLET BY MOUTH EVERY DAY AT BEDTIME, Disp: 90 tablet, Rfl: 2   Multiple Vitamin (MULTIVITAMIN) tablet, Take 1 tablet by mouth daily. BariatricPal Multivitamin, Disp: , Rfl:    olmesartan  (BENICAR ) 20 MG tablet, Take 1 tablet (20 mg total) by mouth daily., Disp: 90 tablet, Rfl: 3  pantoprazole  (PROTONIX ) 40 MG tablet, TAKE 1 TABLET BY MOUTH ONCE DAILY, Disp: 90 tablet, Rfl: 0   potassium chloride  (KLOR-CON  M) 10 MEQ tablet, TAKE 1 TABLET BY MOUTH ONCE DAILY, Disp: 30 tablet, Rfl: 10   Semaglutide , 2 MG/DOSE, (OZEMPIC , 2 MG/DOSE,) 8 MG/3ML SOPN, INJECT 2MG  SUBCUTANEOUSLY ONCE WEEKLY, Disp: 3 mL, Rfl: 2   spironolactone  (ALDACTONE ) 25 MG tablet, TAKE 1/2 TABLET BY MOUTH ONCE DAILY, Disp: 15 tablet, Rfl: 10   triamcinolone  (NASACORT ) 55 MCG/ACT AERO nasal inhaler, Place 2 sprays into the nose daily., Disp: , Rfl:    triamcinolone  cream (KENALOG ) 0.1 %, Apply 1 Application topically 2 (two) times daily., Disp: 30 g, Rfl: 0   venlafaxine  XR (EFFEXOR -XR) 75 MG 24 hr capsule, TAKE 1 CAPSULE BY MOUTH EVERY MORNING, Disp: 30 capsule, Rfl: 2   Vitamin D , Ergocalciferol , (DRISDOL ) 1.25 MG (50000 UNIT) CAPS capsule, Take 1 capsule (50,000 Units total) by mouth every 14 (fourteen) days., Disp: 4 capsule, Rfl: 0  Allergies  Allergen  Reactions   Hydrocodone -Acetaminophen  Other (See Comments)    Extreme headache   Ace Inhibitors Cough   Hctz [Hydrochlorothiazide ] Rash    face    I personally reviewed active problem list, medication list, allergies with the patient/caregiver today.   ROS  Ten systems reviewed and is negative except as mentioned in HPI    Objective  Vitals:   05/06/23 1518 05/06/23 1528  BP: (!) 146/86 (!) 142/86  Pulse: 86   Resp: 16   SpO2: 96%   Weight: 243 lb 12.8 oz (110.6 kg)   Height: 5\' 8"  (1.727 m)     Body mass index is 37.07 kg/m.  Physical Exam  Constitutional: Patient appears well-developed and well-nourished. Obese  No distress.  HEENT: head atraumatic, normocephalic, pupils equal and reactive to light, neck supple Cardiovascular: Normal rate, regular rhythm and normal heart sounds.  No murmur heard. No BLE edema. Pulmonary/Chest: Effort normal and breath sounds normal. No respiratory distress. Skin: erythematous and violaceous area worse on left first toe but also present on other toes, nails intake Abdominal: Soft.  There is no tenderness. Psychiatric: Patient has a normal mood and affect. behavior is normal. Judgment and thought content normal.   Recent Results (from the past 2160 hours)  Comprehensive metabolic panel     Status: None   Collection Time: 02/23/23  8:42 AM  Result Value Ref Range   Glucose 88 70 - 99 mg/dL   BUN 10 8 - 27 mg/dL   Creatinine, Ser 4.09 0.57 - 1.00 mg/dL   eGFR 80 >81 XB/JYN/8.29   BUN/Creatinine Ratio 12 12 - 28   Sodium 143 134 - 144 mmol/L   Potassium 4.1 3.5 - 5.2 mmol/L   Chloride 105 96 - 106 mmol/L   CO2 25 20 - 29 mmol/L   Calcium  9.2 8.7 - 10.3 mg/dL   Total Protein 6.4 6.0 - 8.5 g/dL   Albumin 4.1 3.9 - 4.9 g/dL   Globulin, Total 2.3 1.5 - 4.5 g/dL   Bilirubin Total 0.4 0.0 - 1.2 mg/dL   Alkaline Phosphatase 116 44 - 121 IU/L   AST 18 0 - 40 IU/L   ALT 13 0 - 32 IU/L  Insulin , random     Status: None   Collection  Time: 02/23/23  8:42 AM  Result Value Ref Range   INSULIN  6.1 2.6 - 24.9 uIU/mL  VITAMIN D  25 Hydroxy (Vit-D Deficiency, Fractures)     Status: None   Collection Time:  02/23/23  8:42 AM  Result Value Ref Range   Vit D, 25-Hydroxy 68.4 30.0 - 100.0 ng/mL    Comment: Vitamin D  deficiency has been defined by the Institute of Medicine and an Endocrine Society practice guideline as a level of serum 25-OH vitamin D  less than 20 ng/mL (1,2). The Endocrine Society went on to further define vitamin D  insufficiency as a level between 21 and 29 ng/mL (2). 1. IOM (Institute of Medicine). 2010. Dietary reference    intakes for calcium  and D. [place]: The    [employer]. 2. Holick MF, Binkley [place], Bischoff-Ferrari HA, et al.    Evaluation, treatment, and prevention of vitamin D     deficiency: an Endocrine Society clinical practice    guideline. JCEM. 2011 Jul; 96(7):1911-30.   Hemoglobin A1c     Status: Abnormal   Collection Time: 02/23/23  8:42 AM  Result Value Ref Range   Hgb A1c MFr Bld 6.0 (H) 4.8 - 5.6 %    Comment:          Prediabetes: 5.7 - 6.4          Diabetes: >6.4          Glycemic control for adults with diabetes: <7.0    Est. average glucose Bld gHb Est-mCnc 126 mg/dL  Urine Microalbumin w/creat. ratio     Status: None   Collection Time: 03/03/23  9:00 AM  Result Value Ref Range   Creatinine, Urine 243 20 - 275 mg/dL   Microalb, Ur 1.8 mg/dL    Comment: Reference Range Not established    Microalb Creat Ratio 7 <30 mg/g creat    Comment: . The ADA defines abnormalities in albumin excretion as follows: Aaron Aas Albuminuria Category        Result (mg/g creatinine) . Normal to Mildly increased   <30 Moderately increased         30-299  Severely increased           > OR = 300 . The ADA recommends that at least two of three specimens collected within a 3-6 month period be abnormal before considering a patient to be within a diagnostic category.   Lipid  Profile     Status: None   Collection Time: 03/03/23  9:00 AM  Result Value Ref Range   Cholesterol 124 <200 mg/dL   HDL 77 > OR = 50 mg/dL   Triglycerides 57 <161 mg/dL   LDL Cholesterol (Calc) 34 mg/dL (calc)    Comment: Reference range: <100 . Desirable range <100 mg/dL for primary prevention;   <70 mg/dL for patients with CHD or diabetic patients  with > or = 2 CHD risk factors. Aaron Aas LDL-C is now calculated using the Martin-Hopkins  calculation, which is a validated novel method providing  better accuracy than the Friedewald equation in the  estimation of LDL-C.  Melinda Sprawls et al. Erroll Heard. 0960;454(09): 2061-2068  (http://education.[employer].com/faq/FAQ164)    Total CHOL/HDL Ratio 1.6 <5.0 (calc)   Non-HDL Cholesterol (Calc) 47 <811 mg/dL (calc)    Comment: For patients with diabetes plus 1 major ASCVD risk  factor, treating to a non-HDL-C goal of <100 mg/dL  (LDL-C of <91 mg/dL) is considered a therapeutic  option.     Diabetic Foot Exam:     PHQ2/9:    05/06/2023    3:09 PM 03/23/2023    3:38 PM 03/03/2023    8:05 AM 10/29/2022    8:48 AM 10/28/2022    8:26 AM  Depression  screen PHQ 2/9  Decreased Interest 0 0 0 0 0  Down, Depressed, Hopeless 0 0 0 0 0  PHQ - 2 Score 0 0 0 0 0  Altered sleeping 0 0 0 0   Tired, decreased energy 0 0 0 0   Change in appetite 0 0 0 0   Feeling bad or failure about yourself  0 0 0 0   Trouble concentrating 0 0 0 0   Moving slowly or fidgety/restless 0 0 0 0   Suicidal thoughts 0 0 0 0   PHQ-9 Score 0 0 0 0   Difficult doing work/chores Not difficult at all Not difficult at all       phq 9 is negative  Fall Risk:    05/06/2023    3:09 PM 03/23/2023    3:37 PM 03/03/2023    8:05 AM 10/29/2022    8:47 AM 10/28/2022    8:27 AM  Fall Risk   Falls in the past year? 0 0 0 0 0  Number falls in past yr: 0 0 0 0 0  Injury with Fall? 0 0 0 0 0  Risk for fall due to : No Fall Risks No Fall Risks No Fall Risks No Fall Risks No Fall  Risks  Follow up Falls prevention discussed;Education provided;Falls evaluation completed Falls prevention discussed;Education provided;Falls evaluation completed Falls prevention discussed Falls prevention discussed Falls prevention discussed;Education provided;Falls evaluation completed     Assessment and Plan    Cold Induced Erythrocyanotic Skin Lesions/Childblains  Itchy, red bumps on toes, worse with cold exposure. No pain or blistering. Currently using over-the-counter cortisone cream. -Continue cortisone cream as needed. -Keep feet warm and dry, avoid cold exposure. -Prescribe Triamcinolone  cream, use sparingly to avoid skin thinning.  Subacute Sinusitis - maxillary  Green nasal discharge, postnasal drip, and foul smell for six weeks. No fever or other systemic symptoms. -Prescribe Azithromycin  500mg  daily for three days.

## 2023-05-06 NOTE — Patient Instructions (Signed)
 [employer] Chilblains   Frostbite Frostbite happens when the skin and tissues under the skin freeze and become damaged due to the cold. Frostbite can happen when the skin is not covered well in cold weather. Frostbite often affects the feet, hands, fingers, nose, cheeks, and ears. Frostbite can cause permanent damage to your body. What are the causes? This condition is caused when skin is exposed to freezing temperatures. What increases the risk? You are more likely to develop this condition if: You are outside a lot. Hikers, hunters, and people who are homeless are at risk. You take medicines called beta blockers and are out in the cold. You do not have enough water in your body (are dehydrated) and are out in the cold. You drink alcohol and are out in the cold. You smoke and are out in the cold. You have a medical condition that affects how blood flows through your body. What are the signs or symptoms?     If frostbite is noticed and treated early, you may only have mild peeling of the skin and swelling. Mild or moderate symptoms of this condition include: Loss of feeling in part of your body. Tingling. Pain. Pale skin. Being less able to feel things that you touch. Red skin after warming up again. If frostbite gets worse, the skin may look very light (pale) and feel firm. As the skin warms up, you may have: Redness and very bad pain in the affected area. Blisters that may have blood in them. Skin that looks black, blue, or dark gray. How is this diagnosed? Your doctor will do an exam and ask if you have been out in the cold. Your doctor may also do tests to check how much damage there is. How is this treated? This condition may be treated by: Warming your skin. Drinking warm fluids. Giving over-the-counter or prescription pain medicine. Giving antibiotic medicine. Giving a medicine called TPA. This lessens the chance of blood clots (in very bad cases). Doing surgery to remove  dead or damaged skin (in very bad cases). Putting bandages (dressings) over any blisters. Follow these instructions at home: Wound care Follow instructions from your doctor about how to take care of bandages that cover your blisters. Make sure to: Wash your hands with soap and water for at least 20 seconds before you change your bandages. If you do not have soap and water, use hand sanitizer. Change your bandages as told by your doctor. Do not let the affected area be exposed to cold while it heals. Rest your body parts that were frozen. Raise them above the level of your heart if you can. Try to avoid moving your body parts that were frozen. General instructions Drink enough fluid to keep your pee (urine) pale yellow. Take over-the-counter and prescription medicines only as told by your doctor. Keep all follow-up visits. How is this prevented? Do not ignore the early symptoms of frostbite, such as a loss of feeling (numbness) in part of your body, pain, and red skin. Get out of the cold right away. Do not drink alcohol before you go out into the freezing cold. Do not smoke or use any products that contain nicotine or tobacco. If you need help quitting, ask your doctor. If you are going outside in the cold weather: Wear warm layers of loose clothes. Wear clothes that are waterproof and block the wind. Wear mittens. Mittens are warmer than gloves. Wear two pairs of socks. Wear waterproof boots or shoes that are not  tight. Cover your head, ears, neck, and face. Keep your clothes dry. Move your body. This helps to keep you warm. If you are going to be out in the cold, plan ahead. Be sure to: Pack food. Eating makes you warmer. Have extra blankets and clothes in your car during cold weather. Dress for the weather. Avoid going to places where you cannot get out of the cold if you need to. Contact a doctor if: You have a fever. Get help right away if: You have more redness, swelling, or  pain where your skin froze. You have fluid, blood, or pus coming from where your skin froze. You have very bad peeling skin where your skin froze. Your frozen skin turns a dark color. You see a red line on your skin and it spreads out from where your skin froze. Summary Frostbite happens when the skin and tissues under the skin freeze and become damaged due to the cold. You are more likely to develop this condition if you are outside a lot. Take over-the-counter and prescription medicines only as told by your doctor. If you are going to be out in the cold, plan ahead. This information is not intended to replace advice given to you by your health care provider. Make sure you discuss any questions you have with your health care provider. Document Revised: 05/20/2021 Document Reviewed: 05/20/2021 Elsevier Patient Education  2024 [employer].

## 2023-05-08 ENCOUNTER — Other Ambulatory Visit: Payer: Self-pay | Admitting: Psychiatry

## 2023-05-08 DIAGNOSIS — F3342 Major depressive disorder, recurrent, in full remission: Secondary | ICD-10-CM

## 2023-05-08 DIAGNOSIS — F411 Generalized anxiety disorder: Secondary | ICD-10-CM

## 2023-05-11 ENCOUNTER — Ambulatory Visit (INDEPENDENT_AMBULATORY_CARE_PROVIDER_SITE_OTHER): Payer: Medicare (Managed Care) | Admitting: Family Medicine

## 2023-05-11 ENCOUNTER — Encounter (INDEPENDENT_AMBULATORY_CARE_PROVIDER_SITE_OTHER): Payer: Self-pay | Admitting: Family Medicine

## 2023-05-11 VITALS — BP 133/85 | HR 70 | Temp 97.5°F | Ht 68.0 in | Wt 239.0 lb

## 2023-05-11 DIAGNOSIS — Z7985 Long-term (current) use of injectable non-insulin antidiabetic drugs: Secondary | ICD-10-CM

## 2023-05-11 DIAGNOSIS — E1169 Type 2 diabetes mellitus with other specified complication: Secondary | ICD-10-CM

## 2023-05-11 DIAGNOSIS — E669 Obesity, unspecified: Secondary | ICD-10-CM

## 2023-05-11 DIAGNOSIS — E559 Vitamin D deficiency, unspecified: Secondary | ICD-10-CM

## 2023-05-11 DIAGNOSIS — Z6836 Body mass index (BMI) 36.0-36.9, adult: Secondary | ICD-10-CM

## 2023-05-11 DIAGNOSIS — F430 Acute stress reaction: Secondary | ICD-10-CM | POA: Insufficient documentation

## 2023-05-11 DIAGNOSIS — Z7984 Long term (current) use of oral hypoglycemic drugs: Secondary | ICD-10-CM

## 2023-05-11 MED ORDER — VITAMIN D (ERGOCALCIFEROL) 1.25 MG (50000 UNIT) PO CAPS: 50000.0000 [IU] | ORAL_CAPSULE | ORAL | 0 refills | Status: DC

## 2023-05-11 NOTE — Progress Notes (Signed)
Carlye Grippe, D.O.  ABFM, ABOM Specializing in Clinical Bariatric Medicine  Office located at: [address] [place], [place]  [address]   Assessment and Plan:   FOR THE DISEASE OF OBESITY: Obesity, current BMI 36.35 Obesity, Starting BMI 44.71 Assessment & Plan: Since last office visit on 04/21/23 patient's muscle mass has decreased by 1.4 lb. Fat mass has increased by 2.4 lb. Total body water has increased by 1.2 lb.  Counseling done on how various foods will affect these numbers and how to maximize success  Total lbs lost to date: 38 lbs  Total weight loss percentage to date: 13.72%    Recommended Dietary Goals Regina Christensen is currently in the action stage of change. As such, her goal is to continue weight management plan.  She has agreed to: continue current plan   Behavioral Intervention We discussed the following today: work on managing stress, creating time for self-care and relaxation  Additional resources provided today: {DOhandouts:31655::"None"}  Evidence-based interventions for health behavior change were utilized today including the discussion of self monitoring techniques, problem-solving barriers and SMART goal setting techniques.   Regarding patient's less desirable eating habits and patterns, we employed the technique of small changes.   Pt will specifically work on: *** for next visit.    Recommended Physical Activity Goals Healani has been advised to work up to 150 minutes of moderate intensity aerobic activity a week and strengthening exercises 2-3 times per week for cardiovascular health, weight loss maintenance and preservation of muscle mass.   She has agreed to : doing some for of exercise 30 minutes, 3 days a week.   Pharmacotherapy We discussed various medication options to help [facility] with her weight loss efforts and we both agreed to : {EMPharmaco:28845} {EMagreedrx:29170}   FOR ASSOCIATED CONDITIONS ADDRESSED TODAY:  Type 2  diabetes mellitus with other specified complication, without long-term current use of insulin (HCC) *** All her sugars are under 100s    Vitamin D deficiency Assessment & Plan: She is currently on ERGO 50,000 units every 14 days without any adverse SE. Continue current supplementation regiment. Will refill supplement today.   Orders:   Acute stress reaction- loss of sister Assessment & Plan: Pt is on Effexor-XR 75 mg daily. Pt's underlying mood disorder has been well controlled. However, pt admits that some days have been difficult due to the passing away of her close sister shortly after thanksgiving. Pt has been making it a point to get in all her meals, however has not been feeling like moving/exercising.   Continue current medication regimen. Had a long discussion with pt about the importance of exercise for improvement of mood.        Ozempic and jardiance. Sugars have been stable. Fasting in the 90s. Denies hypoglymecia and hyperglyemcia  Does not feel sick on ozempic is not skipping meals  Since than feels slower at doing things.  Moving more to help mood (walk with granddauhter, dance exer ises)    Follow up:   No follow-ups on file. She was informed of the importance of frequent follow up visits to maximize her success with intensive lifestyle modifications for her multiple health conditions.  Subjective:   Chief complaint: Obesity Regina Christensen is here to discuss her progress with her obesity treatment plan. She is on the Category 1 Plan and states she is following her eating plan approximately 75% of the time. She states she is not exercising.   Interval History: Regina Christensen is here for  a follow up office visit. First encounter with pt; she is followed by William Hamburger NP.   Pharmacotherapy for weight loss: She is currently taking  Ozempic 2 mg weekly. .   Review of Systems:  Pertinent positives were addressed with patient today.  Reviewed by clinician on  day of visit: allergies, medications, problem list, medical history, surgical history, family history, social history, and previous encounter notes.  Weight Summary and Biometrics   Weight Lost Since Last Visit: 0  Weight Gained Since Last Visit: 1 lb  Vitals Temp: (!) 97.5 F (36.4 C) BP: 133/85 Pulse Rate: 70 SpO2: 97 %   Anthropometric Measurements Height: 5\' 8"  (1.727 m) Weight: 239 lb (108.4 kg) BMI (Calculated): 36.35 Weight at Last Visit: 238 lb Weight Lost Since Last Visit: 0 Weight Gained Since Last Visit: 1 lb Starting Weight: 277 lb Total Weight Loss (lbs): 38 lb (17.2 kg)   Body Composition  Body Fat %: 47.2 % Fat Mass (lbs): 113 lbs Muscle Mass (lbs): 120.2 lbs Total Body Water (lbs): 92.2 lbs Visceral Fat Rating : 15   Other Clinical Data Today's Visit #: 30 Starting Date: 05/10/20   Objective:   PHYSICAL EXAM: Blood pressure 133/85, pulse 70, temperature (!) 97.5 F (36.4 C), height 5\' 8"  (1.727 m), weight 239 lb (108.4 kg), SpO2 97%. Body mass index is 36.34 kg/m.  General: she is overweight, cooperative and in no acute distress. PSYCH: Has normal mood, affect and thought process.   HEENT: EOMI, sclerae are anicteric. Lungs: Normal breathing effort, no conversational dyspnea. Extremities: Moves * 4 Neurologic: A and O * 3, good insight  DIAGNOSTIC DATA REVIEWED: BMET    Component Value Date/Time   NA 143 02/23/2023 0842   NA 140 07/10/2012 2133   K 4.1 02/23/2023 0842   K 3.5 07/10/2012 2133   CL 105 02/23/2023 0842   CL 104 07/10/2012 2133   CO2 25 02/23/2023 0842   CO2 29 07/10/2012 2133   GLUCOSE 88 02/23/2023 0842   GLUCOSE 85 10/03/2022 1445   GLUCOSE 142 (H) 07/10/2012 2133   BUN 10 02/23/2023 0842   BUN 16 07/10/2012 2133   CREATININE 0.81 02/23/2023 0842   CREATININE 0.66 03/10/2022 0822   CALCIUM 9.2 02/23/2023 0842   CALCIUM 9.1 07/10/2012 2133   GFRNONAA >60 10/03/2022 1445   GFRNONAA 83 11/29/2019 1512   GFRAA  97 11/29/2019 1512   Lab Results  Component Value Date   HGBA1C 6.0 (H) 02/23/2023   HGBA1C 6.1 11/09/2014   Lab Results  Component Value Date   INSULIN 6.1 02/23/2023   INSULIN 8.0 08/28/2021   Lab Results  Component Value Date   TSH 1.359 03/02/2022   CBC    Component Value Date/Time   WBC 7.0 05/14/2022 1229   RBC 5.01 05/14/2022 1229   HGB 14.6 05/14/2022 1229   HGB 12.9 11/14/2020 0910   HCT 44.9 05/14/2022 1229   HCT 40.9 11/14/2020 0910   PLT 291 05/14/2022 1229   PLT 342 11/14/2020 0910   MCV 89.6 05/14/2022 1229   MCV 90 11/14/2020 0910   MCV 89 07/10/2012 2133   MCH 29.1 05/14/2022 1229   MCHC 32.5 05/14/2022 1229   RDW 15.1 05/14/2022 1229   RDW 13.9 11/14/2020 0910   RDW 15.0 (H) 07/10/2012 2133   Iron Studies    Component Value Date/Time   IRON 39 11/14/2020 0910   TIBC 329 11/14/2020 0910   FERRITIN 25 11/14/2020 0910   IRONPCTSAT 12 (  L) 11/14/2020 0910   Lipid Panel     Component Value Date/Time   CHOL 124 03/03/2023 0900   CHOL 151 12/05/2014 1117   TRIG 57 03/03/2023 0900   HDL 77 03/03/2023 0900   HDL 74 12/05/2014 1117   CHOLHDL 1.6 03/03/2023 0900   VLDL 14 03/01/2020 0306   LDLCALC 34 03/03/2023 0900   Hepatic Function Panel     Component Value Date/Time   PROT 6.4 02/23/2023 0842   PROT 8.0 07/10/2012 2133   ALBUMIN 4.1 02/23/2023 0842   ALBUMIN 3.7 07/10/2012 2133   AST 18 02/23/2023 0842   AST 25 07/10/2012 2133   ALT 13 02/23/2023 0842   ALT 21 07/10/2012 2133   ALKPHOS 116 02/23/2023 0842   ALKPHOS 111 07/10/2012 2133   BILITOT 0.4 02/23/2023 0842   BILITOT 0.3 07/10/2012 2133      Component Value Date/Time   TSH 1.359 03/02/2022 1249   TSH 1.250 11/14/2020 0910   Nutritional Lab Results  Component Value Date   VD25OH 68.4 02/23/2023   VD25OH 60 02/10/2022   VD25OH 38.1 08/28/2021    Attestations:   I, Special Puri, acting as a Stage manager for Thomasene Lot, DO., have compiled all relevant  documentation for today's office visit on behalf of Thomasene Lot, DO, while in the presence of [employer], DO.  Reviewed by clinician on day of visit: allergies, medications, problem list, medical history, surgical history, family history, social history, and previous encounter notes pertinent to patient's obesity diagnosis. I have spent 40 *** minutes in the care of the patient today including: preparing to see patient (e.g. review and interpretation of tests, old notes ), obtaining and/or reviewing separately obtained history, performing a medically appropriate examination or evaluation, counseling and educating the patient, ordering medications, test or procedures, documenting clinical information in the electronic or other health care record, and independently interpreting results and communicating results to the patient, family, or caregiver   I have reviewed the above documentation for accuracy and completeness, and I agree with the above. Carlye Grippe, D.O.  The 21st Century Cures Act was signed into law in 2016 which includes the topic of electronic health records.  This provides immediate access to information in MyChart.  This includes consultation notes, operative notes, office notes, lab results and pathology reports.  If you have any questions about what you read please let us know at your next visit so we can discuss your concerns and take corrective action if need be.  We are right here with you.

## 2023-05-12 ENCOUNTER — Ambulatory Visit (INDEPENDENT_AMBULATORY_CARE_PROVIDER_SITE_OTHER): Payer: Medicare (Managed Care) | Admitting: Adult Health

## 2023-05-20 ENCOUNTER — Telehealth: Payer: Self-pay | Admitting: Pharmacy Technician

## 2023-05-20 ENCOUNTER — Other Ambulatory Visit: Payer: Self-pay | Admitting: Pharmacist

## 2023-05-20 ENCOUNTER — Encounter: Payer: Self-pay | Admitting: Pharmacist

## 2023-05-20 DIAGNOSIS — Z5986 Financial insecurity: Secondary | ICD-10-CM

## 2023-05-20 NOTE — Patient Instructions (Signed)
Goals Addressed             This Visit's Progress    Pharmacy Goals       Please watch the mail for an envelope from [facility] containing the patient assistance program application. Please complete this application and mail back to [facility] Pharmacy Technician Noreene Larsson [place] along with a copy of your Medicare Part D prescription card and a copy of your proof of income document OR you can bring these documents to the office to have them faxed back to Attention: Pattricia Boss at Fax # [phone]   If you need to call Noreene Larsson, you can reach her at [phone]  If you need to reach out to patient assistance programs regarding refills or to find out the status of your application, you can do so by calling:  [employer] at [phone]    Thank you!   Estelle Grumbles, PharmD, [facility] [phone]

## 2023-05-20 NOTE — Progress Notes (Signed)
05/20/2023 Name: Regina Christensen MRN: [id number] DOB: [birth year]  Chief Complaint  Patient presents with   Medication Assistance    Regina Christensen is a 68 y.o. year old female who presented for a telephone visit.   They were referred to the pharmacist by their PCP for assistance in managing medication access.      Subjective:   Care Team: Primary Care Provider: [name], MD; Next Scheduled Visit: 07/06/2023 Cardiologist: Debbe Odea, MD Heart Failure Specialist: Delma Freeze, FNP Urologist: Hulan Fray; Next Scheduled Visit: 05/26/2023 Neurologist: Gelene Mink, PA [facility] Wellness: Julaine Fusi, NP; Next Scheduled Visit: 06/22/2023    Medication Access/Adherence  Current Pharmacy:  [facility], [place] - [address] [place] [address] Phone: [phone] Fax: [phone]  [facility] [place], [place] - [address] [place] [address] Phone: [phone] Fax: [phone]   Patient reports affordability concerns with their medications: Yes  Patient reports access/transportation concerns to their pharmacy: No  Patient reports adherence concerns with their medications:  No      Diabetes:   Current medications:  - Ozempic 2 mg weekly - Jardiance 10 mg daily before breakfast   Current glucose readings: last checked this morning, reading 95   Patient denies hypoglycemic s/sx including dizziness, shakiness, sweating.    Statin therapy: atorvastatin 10 mg daily  Current medication access support:  - Collaborating with PCP and CPhT for support to patient with applying for re-enrollment in patient assistance for Ozempic from [employer] for 2025 calendar year  Reports has 6 week supply remaining - Enrolled in [employer] Cardiomyopathy grant through 10/26/2023     Heart Failure/HTN:   Current  medications:  ACEi/ARB/ARNI: olmesartan 20 mg daily SGLT2i: Jardiance 10 mg daily Beta blocker: metoprolol ER 50 mg - 1.5 tablets (75 mg) twice daily Mineralocorticoid Receptor Antagonist: spironolactone 25 mg - 1/2 tablet (12.5 mg) daily Diuretic regimen: furosemide 40 mg daily as needed   Denies checking home blood pressure recently as needs to obtain new monitor   Denies symptoms of hypotension such as dizziness or lightheadedness  Patient denies volume overload signs or symptoms today     Current medication access support: Enrolled in [employer] Cardiomyopathy grant through 10/26/2023     Atrial Fibrillation:   Current medications: Rate Control: metoprolol ER 50 mg - 1.5 tablets (75 mg) twice daily Anticoagulation Regimen: Eliquis 5 mg twice daily   Denies checking home blood pressure recently as needs to obtain new monitor     Objective:  Lab Results  Component Value Date   HGBA1C 6.0 (H) 02/23/2023    Lab Results  Component Value Date   CREATININE 0.81 02/23/2023   BUN 10 02/23/2023   NA 143 02/23/2023   K 4.1 02/23/2023   CL 105 02/23/2023   CO2 25 02/23/2023    Lab Results  Component Value Date   CHOL 124 03/03/2023   HDL 77 03/03/2023   LDLCALC 34 03/03/2023   TRIG 57 03/03/2023   CHOLHDL 1.6 03/03/2023   BP Readings from Last 3 Encounters:  05/11/23 133/85  05/06/23 (!) 142/86  04/21/23 (!) 151/84   Pulse Readings from Last 3 Encounters:  05/11/23 70  05/06/23 86  04/21/23 76     Medications Reviewed Today     Reviewed by Manuela Neptune, RPH-CPP (Pharmacist) on 05/20/23 at [phone]  Med List  Status: <None>   Medication Order Taking? Sig Documenting Provider Last Dose Status Informant  acetaminophen-codeine (TYLENOL #3) 300-30 MG tablet [id number]  Take 1 tablet by mouth every 4 (four) hours as needed for moderate pain (pain score 4-6). [name], MD  Active   apixaban (ELIQUIS) 5 MG TABS tablet [id number] Yes Take 1 tablet  (5 mg total) by mouth 2 (two) times daily. Debbe Odea, MD Taking Active   atorvastatin (LIPITOR) 10 MG tablet [id number]  TAKE 1 TABLET BY MOUTH ONCE DAILY [name], MD  Active     Discontinued 05/20/23 [phone] (Completed Course)   Blood Pressure KIT [id number]  1 Units by Does not apply route daily. William Hamburger D, NP  Active   famotidine (PEPCID) 20 MG tablet [id number]  TAKE 1 TABLET BY MOUTH DAILY AS NEEDED FOR Loney Hering, Danna Hefty, MD  Active   fexofenadine (ALLEGRA) 180 MG tablet [id number]  Take 180 mg by mouth daily as needed. [provider]  Active   furosemide (LASIX) 40 MG tablet [id number]  TAKE 1 TABLET BY MOUTH DAILY *REFILL REQUESTAlba Cory, MD  Active            Med Note Yetta Flock, Crawford Givens Dec 25, 2022  8:43 AM) Leanora Ivanoff as needed for wt gain  gabapentin (NEURONTIN) 300 MG capsule [id number]  Take 300 mg by mouth 3 (three) times daily as needed (for pain). [provider]  Active   JARDIANCE 10 MG TABS tablet [id number] Yes TAKE 1 TABLET BY MOUTH BEFORE BREAKFAST DAILY Delma Freeze, FNP Taking Active   meclizine (ANTIVERT) 12.5 MG tablet [id number]  Take 1 tablet (12.5 mg total) by mouth 3 (three) times daily as needed for dizziness. [name], MD  Active   metoprolol succinate (TOPROL-XL) 50 MG 24 hr tablet [id number] Yes TAKE 1 & 1/2 TABLETS BY MOUTH EVERY MORNING AND TAKE 1& 1/2 TABLET BY MOUTH EVERY DAY AT BEDTIME Lanier Prude, MD Taking Active   Multiple Vitamin (MULTIVITAMIN) tablet [id number]  Take 1 tablet by mouth daily. [employer]Pal Multivitamin [provider]  Active Self  olmesartan (BENICAR) 20 MG tablet [id number] Yes Take 1 tablet (20 mg total) by mouth daily. Debbe Odea, MD Taking Active   pantoprazole (PROTONIX) 40 MG tablet [id number]  TAKE 1 TABLET BY MOUTH ONCE DAILY [name], MD  Active   potassium chloride (KLOR-CON M) 10 MEQ tablet [id number]  TAKE 1 TABLET BY MOUTH ONCE DAILY [name], MD  Active   Semaglutide, 2 MG/DOSE, (OZEMPIC, 2 MG/DOSE,) 8 MG/3ML SOPN [id number] Yes INJECT 2MG  SUBCUTANEOUSLY ONCE WEEKLY [name], MD Taking Active   spironolactone (ALDACTONE) 25 MG tablet [id number] Yes TAKE 1/2 TABLET BY MOUTH ONCE DAILY Delma Freeze, FNP Taking Active   triamcinolone (NASACORT) 55 MCG/ACT AERO nasal inhaler [id number]  Place 2 sprays into the nose daily. [provider]  Active Self  triamcinolone cream (KENALOG) 0.1 % [id number]  Apply 1 Application topically 2 (two) times daily. [name], MD  Active   venlafaxine XR (EFFEXOR-XR) 75 MG 24 hr capsule [id number]  TAKE 1 CAPSULE BY MOUTH EVERY MORNING Eappen, Saramma, MD  Active   Vitamin D, Ergocalciferol, (DRISDOL) 1.25 MG (50000 UNIT) CAPS capsule [id number]  Take 1 capsule (50,000 Units total) by mouth every 14 (fourteen) days. Thomasene Lot, DO  Active               Assessment/Plan:   Diabetes: - Currently controlled - Recommend to check glucose,  keep log of results and have this record to review at upcoming medical appointments. Patient to contact provider office sooner if needed for readings outside of established parameters or symptoms - Patient to follow up with Ozempic patient assistance as needed for refills - Collaborating with PCP and CPhT for support to patient with applying for re-enrollment in patient assistance for Ozempic from [employer] for 2025 calendar year  Will send a message to CPhT to request that she mail a new application to patient   Hypertension/ Heart Failure/Atrial Fibrillation: - Reviewed appropriate blood pressure monitoring technique and reviewed goal blood pressure - Reviewed to weigh daily and when to contact cardiology with weight gain - Encourage patient to consider obtaining a new upper arm blood pressure monitor OTC. Discuss importance of having correct cuff size for accuracy of readings - Recommend to monitor home blood pressure, keep log  of results and have this record to review at upcoming medical appointments. Patient to contact provider office sooner if needed for readings outside of established parameters or symptoms      Follow Up Plan: Clinical Pharmacist will follow up with patient by telephone on 06/17/2023 at 9:30 AM    Estelle Grumbles, PharmD, [facility] [phone]

## 2023-05-20 NOTE — Progress Notes (Addendum)
Pharmacy Medication Assistance Program Note    05/20/2023  Patient ID: Regina Christensen, female  DOB: 08/23/1955, 68 y.o.  MRN:  [id number]     05/20/2023  Outreach Medication Two  Initial Outreach Date (Medication Two) 02/20/2023  Manufacturer Medication Two Novo Nordisk  [facility] Ozempic  Dose of Ozempic 8mg /64ml  Type of Radiographer, therapeutic Assistance  Date Application Sent to Patient 02/25/2023  Application Items Requested Application;Proof of Income;Other  Date Application Sent to Prescriber 02/25/2023  Name of Prescriber [name]     Received message from [facility] PharmD requesting application be mailed again. Application placed in outgoing mail today.  ADDENDUM 06/08/2023 Successful outreach to patient in regard to [employer] application with [employer]. HIPAA verified. Patient informs she received the application and will place in mail today.  Pattricia Boss, CPhT [place]  Office: [phone] Fax: [phone] Email: [email or web address]

## 2023-05-21 ENCOUNTER — Ambulatory Visit
Admission: RE | Admit: 2023-05-21 | Discharge: 2023-05-21 | Disposition: A | Discharge status: Home / Self Care | Payer: Medicare (Managed Care) | Attending: Gastroenterology | Admitting: Gastroenterology

## 2023-05-21 ENCOUNTER — Encounter
Admission: RE | Disposition: A | Discharge status: Home / Self Care | Payer: Self-pay | Source: Home / Self Care | Attending: Gastroenterology

## 2023-05-21 ENCOUNTER — Encounter: Payer: Self-pay | Admitting: Gastroenterology

## 2023-05-21 ENCOUNTER — Other Ambulatory Visit: Payer: Self-pay

## 2023-05-21 ENCOUNTER — Ambulatory Visit: Payer: Medicare (Managed Care) | Admitting: Certified Registered Nurse Anesthetist

## 2023-05-21 DIAGNOSIS — R197 Diarrhea, unspecified: Secondary | ICD-10-CM

## 2023-05-21 DIAGNOSIS — E119 Type 2 diabetes mellitus without complications: Secondary | ICD-10-CM | POA: Insufficient documentation

## 2023-05-21 DIAGNOSIS — D125 Benign neoplasm of sigmoid colon: Secondary | ICD-10-CM | POA: Diagnosis not present

## 2023-05-21 DIAGNOSIS — K580 Irritable bowel syndrome with diarrhea: Secondary | ICD-10-CM | POA: Insufficient documentation

## 2023-05-21 DIAGNOSIS — I480 Paroxysmal atrial fibrillation: Secondary | ICD-10-CM | POA: Diagnosis not present

## 2023-05-21 DIAGNOSIS — Z6836 Body mass index (BMI) 36.0-36.9, adult: Secondary | ICD-10-CM | POA: Insufficient documentation

## 2023-05-21 DIAGNOSIS — G4730 Sleep apnea, unspecified: Secondary | ICD-10-CM | POA: Insufficient documentation

## 2023-05-21 DIAGNOSIS — I110 Hypertensive heart disease with heart failure: Secondary | ICD-10-CM | POA: Insufficient documentation

## 2023-05-21 DIAGNOSIS — R194 Change in bowel habit: Secondary | ICD-10-CM

## 2023-05-21 DIAGNOSIS — K219 Gastro-esophageal reflux disease without esophagitis: Secondary | ICD-10-CM | POA: Diagnosis not present

## 2023-05-21 DIAGNOSIS — K641 Second degree hemorrhoids: Secondary | ICD-10-CM | POA: Diagnosis not present

## 2023-05-21 DIAGNOSIS — Z7901 Long term (current) use of anticoagulants: Secondary | ICD-10-CM | POA: Diagnosis not present

## 2023-05-21 DIAGNOSIS — K635 Polyp of colon: Secondary | ICD-10-CM

## 2023-05-21 DIAGNOSIS — I5032 Chronic diastolic (congestive) heart failure: Secondary | ICD-10-CM | POA: Diagnosis not present

## 2023-05-21 HISTORY — PX: BIOPSY: SHX5522

## 2023-05-21 HISTORY — PX: COLONOSCOPY WITH PROPOFOL: SHX5780

## 2023-05-21 SURGERY — COLONOSCOPY WITH PROPOFOL
Anesthesia: General

## 2023-05-21 MED ORDER — PROPOFOL 10 MG/ML IV BOLUS
INTRAVENOUS | Status: DC | PRN
  Administered 2023-05-21: 70 mg via INTRAVENOUS

## 2023-05-21 MED ORDER — LIDOCAINE 2% (20 MG/ML) 5 ML SYRINGE
INTRAMUSCULAR | Status: DC | PRN
  Administered 2023-05-21: 20 mg via INTRAVENOUS

## 2023-05-21 MED ORDER — PROPOFOL 500 MG/50ML IV EMUL
INTRAVENOUS | Status: DC | PRN
  Administered 2023-05-21: 120 ug/kg/min via INTRAVENOUS

## 2023-05-21 MED ORDER — SODIUM CHLORIDE 0.9 % IV SOLN: INTRAVENOUS | Status: DC

## 2023-05-21 NOTE — Op Note (Signed)
[facility] Gastroenterology Patient Name: Regina Christensen Procedure Date: 05/21/2023 8:59 AM MRN: [id number] Account #: [id number] Date of Birth: February 04, 1956 Admit Type: Outpatient Age: 68 Room: [facility] ENDO ROOM 4 Gender: Female Note Status: Finalized Instrument Name: Prentice Docker [id number] Procedure:             Colonoscopy Providers:             Midge Minium MD, MD Referring MD:          Midge Minium MD, MD (Referring MD), Onnie Boer. Sowles,                         MD (Referring MD) Medicines:             Propofol per Anesthesia Complications:         No immediate complications. Procedure:             Pre-Anesthesia Assessment:                        - Prior to the procedure, a History and Physical was                         performed, and patient medications and allergies were                         reviewed. The patient's tolerance of previous                         anesthesia was also reviewed. The risks and benefits                         of the procedure and the sedation options and risks                         were discussed with the patient. All questions were                         answered, and informed consent was obtained. Prior                         Anticoagulants: The patient has taken no anticoagulant                         or antiplatelet agents. ASA Grade Assessment: II - A                         patient with mild systemic disease. After reviewing                         the risks and benefits, the patient was deemed in                         satisfactory condition to undergo the procedure.                        After obtaining informed consent, the colonoscope was                         passed under direct  vision. Throughout the procedure,                         the patient's blood pressure, pulse, and oxygen                         saturations were monitored continuously. The                         Colonoscope was introduced through the  anus and                         advanced to the the terminal ileum. The colonoscopy                         was performed without difficulty. The patient                         tolerated the procedure well. The quality of the bowel                         preparation was excellent. Findings:      The perianal and digital rectal examinations were normal.      The terminal ileum appeared normal. Biopsies were taken with a cold       forceps for histology.      Two sessile polyps were found in the sigmoid colon. The polyps were 2 to       3 mm in size. These polyps were removed with a cold biopsy forceps.       Resection and retrieval were complete.      Non-bleeding internal hemorrhoids were found during retroflexion. The       hemorrhoids were Grade II (internal hemorrhoids that prolapse but reduce       spontaneously).      Random biopsies were obtained with cold forceps for histology randomly       in the entire colon. Impression:            - The examined portion of the ileum was normal.                         Biopsied.                        - Two 2 to 3 mm polyps in the sigmoid colon, removed                         with a cold biopsy forceps. Resected and retrieved.                        - Non-bleeding internal hemorrhoids.                        - Random biopsies were obtained in the entire colon. Recommendation:        - Discharge patient to home.                        - Resume previous diet.                        -  Continue present medications.                        - Await pathology results. Procedure Code(s):     --- Professional ---                        [phone], Colonoscopy, flexible; with biopsy, single or                         multiple Diagnosis Code(s):     --- Professional ---                        D12.5, Benign neoplasm of sigmoid colon CPT copyright 2022 American Medical Association. All rights reserved. The codes documented in this report are preliminary  and upon coder review may  be revised to meet current compliance requirements. Midge Minium MD, MD 05/21/2023 9:25:01 AM This report has been signed electronically. Number of Addenda: 0 Note Initiated On: 05/21/2023 8:59 AM Scope Withdrawal Time: 0 hours 6 minutes 29 seconds  Total Procedure Duration: 0 hours 12 minutes 46 seconds  Estimated Blood Loss:  Estimated blood loss: none.      [facility]

## 2023-05-21 NOTE — Anesthesia Postprocedure Evaluation (Signed)
Anesthesia Post Note  Patient: Regina Christensen  Procedure(s) Performed: COLONOSCOPY WITH PROPOFOL BIOPSY  Patient location during evaluation: PACU Anesthesia Type: General Level of consciousness: awake and alert Pain management: pain level controlled Vital Signs Assessment: post-procedure vital signs reviewed and stable Respiratory status: spontaneous breathing, nonlabored ventilation, respiratory function stable and patient connected to nasal cannula oxygen Cardiovascular status: blood pressure returned to baseline and stable Postop Assessment: no apparent nausea or vomiting Anesthetic complications: no  No notable events documented.   Last Vitals:  Vitals:   05/21/23 0825 05/21/23 0932  BP: (!) 176/94 (!) 159/70  Pulse: 76 74  Resp: 16 19  Temp: (!) 36.1 C (!) 35.9 C  SpO2: 98% 98%    Last Pain:  Vitals:   05/21/23 0932  TempSrc: Temporal  PainSc:                  Stephanie Coup

## 2023-05-21 NOTE — H&P (Signed)
Midge Minium, MD [facility] [address] [place], [place] [address] Phone:[phone] Fax : [phone]  Primary Care Physician:  [name], MD Primary Gastroenterologist:  Dr. Servando Snare  Pre-Procedure History & Physical: HPI:  Regina Christensen is a 68 y.o. female is here for an colonoscopy.   Past Medical History:  Diagnosis Date   Anxiety    Arthritis    Back pain    BPPV (benign paroxysmal positional vertigo)    CHF (congestive heart failure) (HCC)    Chronic diastolic HF (heart failure) (HCC)    Complication of anesthesia    Post-operative hypoxia requiring supplemental oxygen   Current use of long term anticoagulation    Apixaban   CVA (cerebral vascular accident) (HCC) 02/28/2020   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; imaging from [facility]   Depression    Edema of both lower extremities    GERD (gastroesophageal reflux disease)    Hypertension    IBS (irritable bowel syndrome)    Lactose intolerance    Morbid obesity with BMI of 45.0-49.9, adult (HCC)    Obesity    Osteoarthritis    PAF (paroxysmal atrial fibrillation) (HCC)    Pre-diabetes    Sleep apnea    uses CPAP machine sometimes    Type 2 diabetes mellitus without complication (HCC)    Vitamin D deficiency     Past Surgical History:  Procedure Laterality Date   ABDOMINAL HYSTERECTOMY     ATRIAL FIBRILLATION ABLATION N/A 05/29/2022   Procedure: ATRIAL FIBRILLATION ABLATION;  Surgeon: Lanier Prude, MD;  [facility]: [facility];  Service: Cardiovascular;  Laterality: N/A;   BREAST EXCISIONAL BIOPSY Left    CARDIOVERSION N/A 03/05/2022   Procedure: CARDIOVERSION;  Surgeon: Debbe Odea, MD;  Location: [facility];  Service: Cardiovascular;  Laterality: N/A;   ENDOMETRIAL ABLATION     x2   ENDOSCOPIC CONCHA BULLOSA RESECTION Bilateral 08/08/2020   Procedure: ENDOSCOPIC CONCHA BULLOSA RESECTION;  Surgeon: Vernie Murders, MD;  Location: [facility];  Service: ENT;  Laterality:  Bilateral;   ETHMOIDECTOMY Right 08/08/2020   Procedure: ETHMOIDECTOMY, LEFT PARTIAL ETHMOIDECTOMY;  Surgeon: Vernie Murders, MD;  Location: [facility];  Service: ENT;  Laterality: Right;   IMAGE GUIDED SINUS SURGERY N/A 08/08/2020   Procedure: IMAGE GUIDED SINUS SURGERY, RIGHT FRONTAL SINUSOTOMY;  Surgeon: Vernie Murders, MD;  Location: [facility];  Service: ENT;  Laterality: N/A;   LAPAROSCOPIC SLEEVE GASTRECTOMY     MAXILLARY ANTROSTOMY Bilateral 08/08/2020   Procedure: MAXILLARY ANTROSTOMY with tissue;  Surgeon: Vernie Murders, MD;  Location: [facility];  Service: ENT;  Laterality: Bilateral;   NASAL TURBINATE REDUCTION  08/27/2020   Procedure: TURBINATE REDUCTION /SUBMUCOSAL DEBRIDEMENT;  Surgeon: Vernie Murders, MD;  Location: [facility];  Service: ENT;;   TOTAL KNEE ARTHROPLASTY Right 06/2019   TOTAL KNEE ARTHROPLASTY Left 01/31/2021   [facility]    Prior to Admission medications   Medication Sig Start Date End Date Taking? Authorizing Provider  diltiazem (CARDIZEM CD) 180 MG 24 hr capsule Take 180 mg by mouth daily.   Yes [provider]  acetaminophen-codeine (TYLENOL #3) 300-30 MG tablet Take 1 tablet by mouth every 4 (four) hours as needed for moderate pain (pain score 4-6). 03/23/23   [name], MD  apixaban (ELIQUIS) 5 MG TABS tablet Take 1 tablet (5 mg total) by mouth 2 (two) times daily. 04/23/23   Debbe Odea, MD  atorvastatin (LIPITOR) 10 MG tablet TAKE 1 TABLET BY MOUTH  ONCE DAILY 03/24/23   [name], MD  Blood Pressure KIT 1 Units by Does not apply route daily. 01/26/23   Danford, Orpha Bur D, NP  famotidine (PEPCID) 20 MG tablet TAKE 1 TABLET BY MOUTH DAILY AS NEEDED FOR INDIGESTION 12/23/22   Carlynn Purl, Danna Hefty, MD  fexofenadine (ALLEGRA) 180 MG tablet Take 180 mg by mouth daily as needed.    [provider]  furosemide (LASIX) 40 MG tablet TAKE 1 TABLET BY MOUTH DAILY *REFILL REQUEST* 12/23/22   [name], MD  gabapentin (NEURONTIN)  300 MG capsule Take 300 mg by mouth 3 (three) times daily as needed (for pain).    [provider]  JARDIANCE 10 MG TABS tablet TAKE 1 TABLET BY MOUTH BEFORE BREAKFAST DAILY 12/23/22   Clarisa Kindred A, FNP  meclizine (ANTIVERT) 12.5 MG tablet Take 1 tablet (12.5 mg total) by mouth 3 (three) times daily as needed for dizziness. 06/25/22   [name], MD  metoprolol succinate (TOPROL-XL) 50 MG 24 hr tablet TAKE 1 & 1/2 TABLETS BY MOUTH EVERY MORNING AND TAKE 1& 1/2 TABLET BY MOUTH EVERY DAY AT BEDTIME 12/24/22   Lanier Prude, MD  Multiple Vitamin (MULTIVITAMIN) tablet Take 1 tablet by mouth daily. BariatricPal Multivitamin    [provider]  olmesartan (BENICAR) 20 MG tablet Take 1 tablet (20 mg total) by mouth daily. 10/07/22   Debbe Odea, MD  pantoprazole (PROTONIX) 40 MG tablet TAKE 1 TABLET BY MOUTH ONCE DAILY 03/24/23   [name], MD  potassium chloride (KLOR-CON M) 10 MEQ tablet TAKE 1 TABLET BY MOUTH ONCE DAILY 04/16/23   [name], MD  Semaglutide, 2 MG/DOSE, (OZEMPIC, 2 MG/DOSE,) 8 MG/3ML SOPN INJECT 2MG  SUBCUTANEOUSLY ONCE WEEKLY 12/23/22   [name], MD  spironolactone (ALDACTONE) 25 MG tablet TAKE 1/2 TABLET BY MOUTH ONCE DAILY 12/23/22   Clarisa Kindred A, FNP  triamcinolone (NASACORT) 55 MCG/ACT AERO nasal inhaler Place 2 sprays into the nose daily.    [provider]  triamcinolone cream (KENALOG) 0.1 % Apply 1 Application topically 2 (two) times daily. 05/06/23   [name], MD  venlafaxine XR (EFFEXOR-XR) 75 MG 24 hr capsule TAKE 1 CAPSULE BY MOUTH EVERY MORNING 05/10/23   Jomarie Longs, MD  Vitamin D, Ergocalciferol, (DRISDOL) 1.25 MG (50000 UNIT) CAPS capsule Take 1 capsule (50,000 Units total) by mouth every 14 (fourteen) days. 05/11/23   Thomasene Lot, DO    Allergies as of 04/23/2023 - Review Complete 04/21/2023  Allergen Reaction Noted   Hydrocodone-acetaminophen Other (See Comments) 08/27/2020   Ace inhibitors Cough  11/30/2015   Hctz [hydrochlorothiazide] Rash 06/20/2016    Family History  Problem Relation Age of Onset   Diabetes Mother    High blood pressure Mother    Heart disease Father    Stroke Sister    Cancer Brother    Mental illness Neg Hx     Social History   Socioeconomic History   Marital status: Divorced    Spouse name: Not on file   Number of children: 2   Years of education: Not on file   Highest education level: Associate degree: occupational, Scientist, product/process development, or vocational program  Occupational History   Occupation: Retired/ work PT    Employer: [employer]  Tobacco Use   Smoking status: Never   Smokeless tobacco: Never  Vaping Use   Vaping status: Never Used  Substance and Sexual Activity   Alcohol use: No    Alcohol/week: 0.0 standard drinks of alcohol  Comment: rare   Drug use: No   Sexual activity: Not on file  Other Topics Concern   Not on file  Social History Narrative   Not on file   Social Drivers of Health   Financial Resource Strain: Medium Risk (10/28/2022)   Overall Financial Resource Strain (CARDIA)    Difficulty of Paying Living Expenses: Somewhat hard  Food Insecurity: No Food Insecurity (10/28/2022)   Hunger Vital Sign    Worried About Running Out of Food in the Last Year: Never true    Ran Out of Food in the Last Year: Never true  Transportation Needs: No Transportation Needs (10/28/2022)   PRAPARE - Administrator, Civil Service (Medical): No    Lack of Transportation (Non-Medical): No  Physical Activity: Insufficiently Active (10/28/2022)   Exercise Vital Sign    Days of Exercise per Week: 3 days    Minutes of Exercise per Session: 30 min  Stress: No Stress Concern Present (10/28/2022)   [employer] of Occupational Health - Occupational Stress Questionnaire    Feeling of Stress : Only a little  Social Connections: Moderately Isolated (10/28/2022)   Social Connection and Isolation Panel [NHANES]    Frequency of Communication with Friends  and Family: More than three times a week    Frequency of Social Gatherings with Friends and Family: Three times a week    Attends Religious Services: More than 4 times per year    Active Member of Clubs or Organizations: No    Attends Banker Meetings: Never    Marital Status: Divorced  Catering manager Violence: Not At Risk (10/28/2022)   Humiliation, Afraid, Rape, and Kick questionnaire    Fear of Current or Ex-Partner: No    Emotionally Abused: No    Physically Abused: No    Sexually Abused: No    Review of Systems: See HPI, otherwise negative ROS  Physical Exam: There were no vitals taken for this visit. General:   Alert,  pleasant and cooperative in NAD Head:  Normocephalic and atraumatic. Neck:  Supple; no masses or thyromegaly. Lungs:  Clear throughout to auscultation.    Heart:  Regular rate and rhythm. Abdomen:  Soft, nontender and nondistended. Normal bowel sounds, without guarding, and without rebound.   Neurologic:  Alert and  oriented x4;  grossly normal neurologically.  Impression/Plan: Regina Christensen is here for an colonoscopy to be performed for diarrhea  Risks, benefits, limitations, and alternatives regarding  colonoscopy have been reviewed with the patient.  Questions have been answered.  All parties agreeable.   Midge Minium, MD  05/21/2023, 8:22 AM

## 2023-05-21 NOTE — Transfer of Care (Signed)
Immediate Anesthesia Transfer of Care Note  Patient: Regina Christensen  Procedure(s) Performed: COLONOSCOPY WITH PROPOFOL BIOPSY  Patient Location: Endoscopy Unit  Anesthesia Type:General  Level of Consciousness: drowsy  Airway & Oxygen Therapy: Patient Spontanous Breathing  Post-op Assessment: Report given to RN and Post -op Vital signs reviewed and stable  Post vital signs: Reviewed  Last Vitals:  Vitals Value Taken Time  BP 159/70 05/21/23 0926  Temp    Pulse 80 05/21/23 0926  Resp 16 05/21/23 0926  SpO2 100 % 05/21/23 0926  Vitals shown include unfiled device data.  Last Pain:  Vitals:   05/21/23 0825  TempSrc: Temporal  PainSc: 0-No pain         Complications: No notable events documented.

## 2023-05-21 NOTE — Anesthesia Preprocedure Evaluation (Addendum)
Anesthesia Evaluation  Patient identified by MRN, date of birth, ID band Patient awake    Reviewed: Allergy & Precautions, NPO status , Patient's Chart, lab work & pertinent test results  Airway Mallampati: III  TM Distance: >3 FB Neck ROM: full    Dental  (+) Chipped, Dental Advidsory Given   Pulmonary neg pulmonary ROS, sleep apnea    Pulmonary exam normal        Cardiovascular hypertension, +CHF  negative cardio ROS Normal cardiovascular exam+ dysrhythmias Atrial Fibrillation      Neuro/Psych  PSYCHIATRIC DISORDERS Anxiety Depression    CVA negative neurological ROS  negative psych ROS   GI/Hepatic negative GI ROS, Neg liver ROS,GERD  Medicated,,  Endo/Other  negative endocrine ROSdiabetes    Renal/GU negative Renal ROS  negative genitourinary   Musculoskeletal   Abdominal   Peds  Hematology negative hematology ROS (+)   Anesthesia Other Findings Past Medical History: No date: Anxiety No date: Arthritis No date: Back pain No date: BPPV (benign paroxysmal positional vertigo) No date: CHF (congestive heart failure) (HCC) No date: Chronic diastolic HF (heart failure) (HCC) No date: Complication of anesthesia     Comment:  Post-operative hypoxia requiring supplemental oxygen No date: Current use of long term anticoagulation     Comment:  Apixaban 02/28/2020: CVA (cerebral vascular accident) (HCC)     Comment:  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; imaging from               [facility] No date: Depression No date: Edema of both lower extremities No date: GERD (gastroesophageal reflux disease) No date: Hypertension No date: IBS (irritable bowel syndrome) No date: Lactose intolerance No date: Morbid obesity with BMI of 45.0-49.9, adult (HCC) No date: Obesity No date: Osteoarthritis No date: PAF (paroxysmal atrial fibrillation) (HCC) No date: Pre-diabetes No date: Sleep apnea     Comment:  uses CPAP machine  sometimes  No date: Type 2 diabetes mellitus without complication (HCC) No date: Vitamin D deficiency  Past Surgical History: No date: ABDOMINAL HYSTERECTOMY 05/29/2022: ATRIAL FIBRILLATION ABLATION; N/A     Comment:  Procedure: ATRIAL FIBRILLATION ABLATION;  Surgeon:               Lanier Prude, MD;  Location: [facility];                Service: Cardiovascular;  Laterality: N/A; No date: BREAST EXCISIONAL BIOPSY; Left 03/05/2022: CARDIOVERSION; N/A     Comment:  Procedure: CARDIOVERSION;  Surgeon: Debbe Odea,               MD;  Location: [facility];  Service: Cardiovascular;                Laterality: N/A; No date: ENDOMETRIAL ABLATION     Comment:  x2 08/08/2020: ENDOSCOPIC CONCHA BULLOSA RESECTION; Bilateral     Comment:  Procedure: ENDOSCOPIC CONCHA BULLOSA RESECTION;                Surgeon: Vernie Murders, MD;  Location: [facility];                Service: ENT;  Laterality: Bilateral; 08/08/2020: ETHMOIDECTOMY; Right     Comment:  Procedure: ETHMOIDECTOMY, LEFT PARTIAL ETHMOIDECTOMY;                Surgeon: Vernie Murders, MD;  Location: [facility];                Service: ENT;  Laterality: Right; 08/08/2020: IMAGE GUIDED SINUS SURGERY; N/A     Comment:  Procedure: IMAGE GUIDED SINUS SURGERY, RIGHT FRONTAL               SINUSOTOMY;  Surgeon: Vernie Murders, MD;  Location: [facility]               ORS;  Service: ENT;  Laterality: N/A; No date: LAPAROSCOPIC SLEEVE GASTRECTOMY 08/08/2020: MAXILLARY ANTROSTOMY; Bilateral     Comment:  Procedure: MAXILLARY ANTROSTOMY with tissue;  Surgeon:               Vernie Murders, MD;  Location: [facility];  Service: ENT;                Laterality: Bilateral; 08/27/2020: NASAL TURBINATE REDUCTION     Comment:  Procedure: TURBINATE REDUCTION /SUBMUCOSAL DEBRIDEMENT;               Surgeon: Vernie Murders, MD;  Location: [facility];                Service: ENT;; 06/2019: TOTAL KNEE ARTHROPLASTY; Right 01/31/2021: TOTAL KNEE ARTHROPLASTY; Left      Comment:  [facility]     Reproductive/Obstetrics negative OB ROS                             Anesthesia Physical Anesthesia Plan  ASA: 3  Anesthesia Plan: General   Post-op Pain Management: Minimal or no pain anticipated   Induction: Intravenous  PONV Risk Score and Plan: 3 and Propofol infusion, TIVA and Ondansetron  Airway Management Planned: Nasal Cannula  Additional Equipment: None  Intra-op Plan:   Post-operative Plan:   Informed Consent: I have reviewed the patients History and Physical, chart, labs and discussed the procedure including the risks, benefits and alternatives for the proposed anesthesia with the patient or authorized representative who has indicated his/her understanding and acceptance.     Dental advisory given  Plan Discussed with: CRNA and Surgeon  Anesthesia Plan Comments: (Discussed risks of anesthesia with patient, including possibility of difficulty with spontaneous ventilation under anesthesia necessitating airway intervention, PONV, and rare risks such as cardiac or respiratory or neurological events, and allergic reactions. Discussed the role of CRNA in patient's perioperative care. Patient understands.)       Anesthesia Quick Evaluation

## 2023-05-22 LAB — SURGICAL PATHOLOGY

## 2023-05-24 NOTE — Progress Notes (Signed)
 05/26/2023 10:14 AM   Regina Christensen 10-22-1955 [id number]  Referring provider: Glenard Mire, MD [address] [place],  [place] [address]  Urological history: 1.  High risk hematuria -non -smoker -CTU (04/2022) - no GU malignancies -cysto (04/2022) - NED   2.  Nephrolithiasis -CTU (04/2022) - 3 mm non obstructing stone lower pole of right kidney  3. Renal cysts -CTU (04/2022) -simple cyst in left kidney with bilateral proteinaceous or hemorrhagic cysts in both kidneys  Chief Complaint  Patient presents with   Follow-up   HPI: Regina Christensen is a 68 y.o. female who presents today for UA follow up.  Previous records reviewed.     At her visit on July, 30th, 2024, she had findings of an abnormal appendiceal diameter and a perirectal nodule for which a follow-up CT of the pelvis was recommended in 3 months for surveillance.  She was also seen by her general surgeon and they are agreement.  The CT scan has not been completed as of this visit.  She is having 1-7 daytime urinations, nocturia x 1-2 with a mild urge to urinate.  She wears 1 panty liner daily.  She does engage in toilet mapping.  Patient denies any modifying or aggravating factors.  Patient denies any recent UTI's, gross hematuria, dysuria or suprapubic/flank pain.  Patient denies any fevers, chills, nausea or vomiting.   UA yellow slightly cloudy, 3+ glucose, specific gravity 1.015, 1+ heme, pH 7.0, trace protein, 0-5 WBCs, 11-30 RBCs, 0-10 epithelial cells and many bacteria.  Urine culture MUF.  PVR 76 mL   She was scheduled for a follow up in six months for repeat UA and encouraged her to reach out to her general surgeon.   She was seen by her general surgeon.    She is having 1-7 daytime voids, nocturia x 1-2 with a mild urge to urinate.  She does have some urge incontinence.  She leaks 1-2 times a week.  She wears 1 depends daily.  She does limit her fluid intake.  She does engage in toilet  mapping.  Patient denies any modifying or aggravating factors.  Patient denies any recent UTI's, gross hematuria, dysuria or suprapubic/flank pain.  Patient denies any fevers, chills, nausea or vomiting.    UA yellow slightly cloudy, trace glucose, specific gravity less than 1.005, trace blood, pH 6.5, trace leukocyte, 11-30 WBCs, 3-10 RBCs, 0-10 epithelial cells and many bacteria.  PMH: Past Medical History:  Diagnosis Date   Anxiety    Arthritis    Back pain    BPPV (benign paroxysmal positional vertigo)    CHF (congestive heart failure) (HCC)    Chronic diastolic HF (heart failure) (HCC)    Complication of anesthesia    Post-operative hypoxia requiring supplemental oxygen   Current use of long term anticoagulation    Apixaban    CVA (cerebral vascular accident) (HCC) 02/28/2020   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; imaging from [facility]   Depression    Edema of both lower extremities    GERD (gastroesophageal reflux disease)    Hypertension    IBS (irritable bowel syndrome)    Lactose intolerance    Morbid obesity with BMI of 45.0-49.9, adult (HCC)    Obesity    Osteoarthritis    PAF (paroxysmal atrial fibrillation) (HCC)    Pre-diabetes    Sleep apnea    uses CPAP machine sometimes    Type 2 diabetes mellitus without complication (HCC)  Vitamin D  deficiency     Surgical History: Past Surgical History:  Procedure Laterality Date   ABDOMINAL HYSTERECTOMY     ATRIAL FIBRILLATION ABLATION N/A 05/29/2022   Procedure: ATRIAL FIBRILLATION ABLATION;  Surgeon: Cindie Ole DASEN, MD;  [facility]: [facility];  Service: Cardiovascular;  Laterality: N/A;   BIOPSY  05/21/2023   Procedure: BIOPSY;  Surgeon: Jinny Carmine, MD;  Location: [facility];  Service: Endoscopy;;   BREAST EXCISIONAL BIOPSY Left    CARDIOVERSION N/A 03/05/2022   Procedure: CARDIOVERSION;  Surgeon: Darliss Rogue, MD;  Location: [facility];  Service: Cardiovascular;  Laterality: N/A;    COLONOSCOPY WITH PROPOFOL  N/A 05/21/2023   Procedure: COLONOSCOPY WITH PROPOFOL ;  Surgeon: Jinny Carmine, MD;  Location: [facility];  Service: Endoscopy;  Laterality: N/A;   ENDOMETRIAL ABLATION     x2   ENDOSCOPIC CONCHA BULLOSA RESECTION Bilateral 08/08/2020   Procedure: ENDOSCOPIC CONCHA BULLOSA RESECTION;  Surgeon: Edda Mt, MD;  Location: [facility];  Service: ENT;  Laterality: Bilateral;   ETHMOIDECTOMY Right 08/08/2020   Procedure: ETHMOIDECTOMY, LEFT PARTIAL ETHMOIDECTOMY;  Surgeon: Edda Mt, MD;  Location: [facility];  Service: ENT;  Laterality: Right;   IMAGE GUIDED SINUS SURGERY N/A 08/08/2020   Procedure: IMAGE GUIDED SINUS SURGERY, RIGHT FRONTAL SINUSOTOMY;  Surgeon: Edda Mt, MD;  Location: [facility];  Service: ENT;  Laterality: N/A;   LAPAROSCOPIC SLEEVE GASTRECTOMY     MAXILLARY ANTROSTOMY Bilateral 08/08/2020   Procedure: MAXILLARY ANTROSTOMY with tissue;  Surgeon: Edda Mt, MD;  Location: [facility];  Service: ENT;  Laterality: Bilateral;   NASAL TURBINATE REDUCTION  08/27/2020   Procedure: TURBINATE REDUCTION /SUBMUCOSAL DEBRIDEMENT;  Surgeon: Juengel, Paul, MD;  Location: [facility];  Service: ENT;;   TOTAL KNEE ARTHROPLASTY Right 06/2019   TOTAL KNEE ARTHROPLASTY Left 01/31/2021   [facility]    Home Medications:  Allergies as of 05/26/2023       Reactions   Hydrocodone -acetaminophen  Other (See Comments)   Extreme headache   Ace Inhibitors Cough   Hctz [hydrochlorothiazide ] Rash   face        Medication List        Accurate as of May 26, 2023 10:14 AM. If you have any questions, ask your nurse or doctor.          acetaminophen -codeine  300-30 MG tablet Commonly known as: TYLENOL  #3 Take 1 tablet by mouth every 4 (four) hours as needed for moderate pain (pain score 4-6).   apixaban  5 MG Tabs tablet Commonly known as: Eliquis  Take 1 tablet (5 mg total) by mouth 2 (two) times daily.   atorvastatin  10 MG  tablet Commonly known as: LIPITOR TAKE 1 TABLET BY MOUTH ONCE DAILY   Blood Pressure Kit 1 Units by Does not apply route daily.   diltiazem  180 MG 24 hr capsule Commonly known as: CARDIZEM  CD Take 180 mg by mouth daily.   famotidine  20 MG tablet Commonly known as: PEPCID  TAKE 1 TABLET BY MOUTH DAILY AS NEEDED FOR INDIGESTION   fexofenadine 180 MG tablet Commonly known as: ALLEGRA Take 180 mg by mouth daily as needed.   furosemide  40 MG tablet Commonly known as: LASIX  TAKE 1 TABLET BY MOUTH DAILY *REFILL REQUEST*   gabapentin  300 MG capsule Commonly known as: NEURONTIN  Take 300 mg by mouth 3 (three) times daily as needed (for pain).   Jardiance  10 MG Tabs tablet Generic drug: empagliflozin  TAKE 1 TABLET BY MOUTH BEFORE BREAKFAST DAILY   meclizine  12.5 MG tablet Commonly  known as: ANTIVERT  Take 1 tablet (12.5 mg total) by mouth 3 (three) times daily as needed for dizziness.   metoprolol  succinate 50 MG 24 hr tablet Commonly known as: TOPROL -XL TAKE 1 & 1/2 TABLETS BY MOUTH EVERY MORNING AND TAKE 1& 1/2 TABLET BY MOUTH EVERY DAY AT BEDTIME   multivitamin tablet Take 1 tablet by mouth daily. BariatricPal Multivitamin   olmesartan  20 MG tablet Commonly known as: BENICAR  Take 1 tablet (20 mg total) by mouth daily.   Ozempic  (2 MG/DOSE) 8 MG/3ML Sopn Generic drug: Semaglutide  (2 MG/DOSE) INJECT 2MG  SUBCUTANEOUSLY ONCE WEEKLY   pantoprazole  40 MG tablet Commonly known as: PROTONIX  TAKE 1 TABLET BY MOUTH ONCE DAILY   potassium chloride  10 MEQ tablet Commonly known as: KLOR-CON  M TAKE 1 TABLET BY MOUTH ONCE DAILY   spironolactone  25 MG tablet Commonly known as: ALDACTONE  TAKE 1/2 TABLET BY MOUTH ONCE DAILY   triamcinolone  55 MCG/ACT Aero nasal inhaler Commonly known as: NASACORT  Place 2 sprays into the nose daily.   triamcinolone  cream 0.1 % Commonly known as: KENALOG  Apply 1 Application topically 2 (two) times daily.   venlafaxine  XR 75 MG 24 hr  capsule Commonly known as: EFFEXOR -XR TAKE 1 CAPSULE BY MOUTH EVERY MORNING   Vitamin D  (Ergocalciferol ) 1.25 MG (50000 UNIT) Caps capsule Commonly known as: DRISDOL  Take 1 capsule (50,000 Units total) by mouth every 14 (fourteen) days.        Allergies:  Allergies  Allergen Reactions   Hydrocodone -Acetaminophen  Other (See Comments)    Extreme headache   Ace Inhibitors Cough   Hctz [Hydrochlorothiazide ] Rash    face    Family History: Family History  Problem Relation Age of Onset   Diabetes Mother    High blood pressure Mother    Heart disease Father    Stroke Sister    Cancer Brother    Mental illness Neg Hx     Social History:  reports that she has never smoked. She has never used smokeless tobacco. She reports that she does not drink alcohol and does not use drugs.  ROS: Pertinent ROS in HPI  Physical Exam: BP (!) 164/98   Pulse 94   Ht 5' 8 (1.727 m)   Wt 237 lb (107.5 kg)   BMI 36.04 kg/m   Constitutional:  Well nourished. Alert and oriented, No acute distress. HEENT: [place] AT, moist mucus membranes.  Trachea midline, no masses. Cardiovascular: No clubbing, cyanosis, or edema. Respiratory: Normal respiratory effort, no increased work of breathing. Neurologic: Grossly intact, no focal deficits, moving all 4 extremities. Psychiatric: Normal mood and affect.    Laboratory Data: Lab Results  Component Value Date   CREATININE 0.81 02/23/2023   Lab Results  Component Value Date   HGBA1C 6.0 (H) 02/23/2023      Component Value Date/Time   CHOL 124 03/03/2023 0900   CHOL 151 12/05/2014 1117   HDL 77 03/03/2023 0900   HDL 74 12/05/2014 1117   CHOLHDL 1.6 03/03/2023 0900   VLDL 14 03/01/2020 0306   LDLCALC 34 03/03/2023 0900   Lab Results  Component Value Date   AST 18 02/23/2023   Lab Results  Component Value Date   ALT 13 02/23/2023   Urinalysis See EPIC and HPI  I have reviewed the labs.   Pertinent Imaging: N/A  Assessment & Plan:     1.  High risk hematuria -Non-smoker -Workup in January 2024-no GU malignancies noted -No reports of gross hematuria -UA w/micro heme  -We discussed  the persistent microscopic hematuria may be the result of her Eliquis , or a nephropathy or a small chance of GU malignancy though less likely with a negative workup last year.  I offered a referral to nephrology for further evaluation, she deferred at this time stating she had a lot of doctor visits in the next couple months.  She would like to return in 3 months to see if the microscopic hematuria persists or worsens.  2. Nephrolithiasis -Asymptomatic  3. Urge incontinence -Not bothersome   Return in about 3 months (around 08/23/2023) for UA, OAB .  These notes generated with voice recognition software. I apologize for typographical errors.  Regina Christensen  [facility] [address]  [address] [place], [place] [address] [phone]

## 2023-05-25 ENCOUNTER — Encounter: Payer: Self-pay | Admitting: Gastroenterology

## 2023-05-26 ENCOUNTER — Ambulatory Visit: Payer: Medicare (Managed Care) | Admitting: Urology

## 2023-05-26 ENCOUNTER — Encounter: Payer: Self-pay | Admitting: Gastroenterology

## 2023-05-26 ENCOUNTER — Encounter: Payer: Self-pay | Admitting: Urology

## 2023-05-26 VITALS — BP 164/98 | HR 94 | Ht 68.0 in | Wt 237.0 lb

## 2023-05-26 DIAGNOSIS — Z87442 Personal history of urinary calculi: Secondary | ICD-10-CM | POA: Diagnosis not present

## 2023-05-26 DIAGNOSIS — Z09 Encounter for follow-up examination after completed treatment for conditions other than malignant neoplasm: Secondary | ICD-10-CM | POA: Diagnosis not present

## 2023-05-26 DIAGNOSIS — N200 Calculus of kidney: Secondary | ICD-10-CM

## 2023-05-26 DIAGNOSIS — Z87898 Personal history of other specified conditions: Secondary | ICD-10-CM | POA: Diagnosis not present

## 2023-05-26 DIAGNOSIS — R319 Hematuria, unspecified: Secondary | ICD-10-CM

## 2023-05-26 DIAGNOSIS — N3941 Urge incontinence: Secondary | ICD-10-CM

## 2023-05-26 LAB — MICROSCOPIC EXAMINATION

## 2023-05-26 LAB — URINALYSIS, COMPLETE
Bilirubin, UA: NEGATIVE
Ketones, UA: NEGATIVE
Nitrite, UA: NEGATIVE
Protein,UA: NEGATIVE
Specific Gravity, UA: <1.005 — ABNORMAL LOW (ref 1.005–1.030)
Urobilinogen, Ur: 0.2 mg/dL (ref 0.2–1.0)
pH, UA: 6.5 (ref 5.0–7.5)

## 2023-05-28 DIAGNOSIS — H2511 Age-related nuclear cataract, right eye: Secondary | ICD-10-CM | POA: Diagnosis not present

## 2023-06-08 ENCOUNTER — Other Ambulatory Visit: Payer: Self-pay | Admitting: Psychiatry

## 2023-06-08 DIAGNOSIS — F411 Generalized anxiety disorder: Secondary | ICD-10-CM

## 2023-06-08 DIAGNOSIS — F3342 Major depressive disorder, recurrent, in full remission: Secondary | ICD-10-CM

## 2023-06-09 NOTE — Progress Notes (Unsigned)
  Electrophysiology Office Follow up Visit Note:    Date:  06/10/2023   ID:  [name], DOB Aug 19, 1955, MRN [id number]  PCP:  [name], MD  [facility] Cardiologist:  Debbe Odea, MD  [facility] Electrophysiologist:  Lanier Prude, MD    Interval History:     Regina Christensen is a 68 y.o. female who presents for a follow up visit.  I last saw the patient Sep 10, 2022 for her atrial fibrillation.  She has a prior catheter ablation for atrial fibrillation May 29, 2022.  She was previously on flecainide but this was stopped.  She takes Eliquis for stroke prophylaxis.  At the last appointment I started her on Toprol-XL and stopped her short acting metoprolol and diltiazem.  She is doing well today. No AF that she is aware of.  No dyspnea with exertion. No swelling. No syncope/presyncope.        Past medical, surgical, social and family history were reviewed.  ROS:   Please see the history of present illness.    All other systems reviewed and are negative.  EKGs/Labs/Other Studies Reviewed:    The following studies were reviewed today:    ECG reviewed from today. Shows sinus rhythm.      Physical Exam:    VS:  BP 130/68   Pulse 74   Ht 5\' 8"  (1.727 m)   Wt 247 lb 6.4 oz (112.2 kg)   SpO2 98%   BMI 37.62 kg/m     Wt Readings from Last 3 Encounters:  06/10/23 247 lb 6.4 oz (112.2 kg)  05/26/23 237 lb (107.5 kg)  05/21/23 237 lb (107.5 kg)     GEN: no distress CARD: RRR, No MRG. No edema. RESP: No IWOB. CTAB.      ASSESSMENT:    1. Paroxysmal A-fib (HCC)   2. Chronic systolic heart failure (HCC)    PLAN:    In order of problems listed above:  #Paroxysmal atrial fibrillation Maintaining sinus rhythm after prior catheter ablation. Continue metoprolol succinate 75 mg by mouth once daily Continue Eliquis 5 mg by mouth twice daily  #Chronic systolic heart failure NYHA class II.  EF is recovered on July 2024  echo. Rhythm control indicated Continue spironolactone, olmesartan, metoprolol, Jardiance, Lasix  Follow-up with EP APP in 1 year. Follow up with gen cards in 6 months.   Signed, Steffanie Dunn, MD, [facility], [facility] 06/10/2023 8:27 AM    Electrophysiology  [facility]

## 2023-06-10 ENCOUNTER — Encounter: Payer: Self-pay | Admitting: Cardiology

## 2023-06-10 ENCOUNTER — Ambulatory Visit: Payer: Medicare (Managed Care) | Admitting: Medical

## 2023-06-10 ENCOUNTER — Ambulatory Visit: Payer: Medicare (Managed Care) | Attending: Cardiology | Admitting: Cardiology

## 2023-06-10 VITALS — BP 130/68 | HR 74 | Ht 68.0 in | Wt 247.4 lb

## 2023-06-10 DIAGNOSIS — I480 Paroxysmal atrial fibrillation: Secondary | ICD-10-CM

## 2023-06-10 DIAGNOSIS — I5022 Chronic systolic (congestive) heart failure: Secondary | ICD-10-CM

## 2023-06-10 NOTE — Progress Notes (Signed)
 This encounter was created in error - please disregard.

## 2023-06-10 NOTE — Patient Instructions (Signed)
Medication Instructions:  Your physician recommends that you continue on your current medications as directed. Please refer to the Current Medication list given to you today.  *If you need a refill on your cardiac medications before your next appointment, please call your pharmacy*  Follow-Up: At [facility], you and your health needs are our priority.  As part of our continuing mission to provide you with exceptional heart care, we have created designated Provider Care Teams.  These Care Teams include your primary Cardiologist (physician) and Advanced Practice Providers (APPs -  Physician Assistants and Nurse Practitioners) who all work together to provide you with the care you need, when you need it.  Your next appointment:   6 months  Provider:   Cadence Fransico Michael, PA-C    Other Instructions Follow up with Sherie Don, NP in one year

## 2023-06-17 ENCOUNTER — Telehealth: Payer: Self-pay | Admitting: Pharmacist

## 2023-06-17 ENCOUNTER — Other Ambulatory Visit: Payer: Self-pay | Admitting: Family Medicine

## 2023-06-17 ENCOUNTER — Other Ambulatory Visit: Payer: Self-pay | Admitting: Pharmacist

## 2023-06-17 DIAGNOSIS — K219 Gastro-esophageal reflux disease without esophagitis: Secondary | ICD-10-CM

## 2023-06-17 DIAGNOSIS — Z8673 Personal history of transient ischemic attack (TIA), and cerebral infarction without residual deficits: Secondary | ICD-10-CM

## 2023-06-17 NOTE — Telephone Encounter (Signed)
   Outreach Note  06/17/2023 Name: Regina Christensen MRN: [id number] DOB: [birth year]  Referred by: [name], MD  Reach patient by telephone, but she asks to reschedule as needed to attend a meeting today. Rescheduled as requested  Follow Up Plan: Clinical Pharmacist will follow up with patient by telephone on 07/15/2023 at 9:30 AM   Estelle Grumbles, PharmD, [facility] [phone]

## 2023-06-18 ENCOUNTER — Telehealth: Payer: Self-pay | Admitting: Pharmacy Technician

## 2023-06-18 DIAGNOSIS — H2512 Age-related nuclear cataract, left eye: Secondary | ICD-10-CM | POA: Diagnosis not present

## 2023-06-18 DIAGNOSIS — Z5986 Financial insecurity: Secondary | ICD-10-CM

## 2023-06-18 NOTE — Progress Notes (Signed)
 Pharmacy Medication Assistance Program Note    06/18/2023  Patient ID: Regina Christensen, female  DOB: [birth year], 68 y.o.  MRN:  [id number]     05/20/2023 06/18/2023  Outreach Medication Two  Initial Outreach Date (Medication Two) 02/20/2023   Manufacturer Medication Two Novo Nordisk   Nordisk Drugs Ozempic   Dose of Ozempic 8mg /86ml   Type of Radiographer, therapeutic Assistance   Date Application Sent to Patient 02/25/2023   Application Items Requested Application;Proof of Income;Other   Date Application Sent to Prescriber 02/25/2023   Name of Prescriber [name]   Date Application Received From Patient  06/16/2023  Application Items Received From Patient  Application;Other  Date Application Received From Provider  03/11/2023  Method Application Sent to Manufacturer  Fax  Date Application Submitted to Manufacturer  06/17/2023     Regina Christensen, CPhT [place]  Office: [phone] Fax: [phone] Email: [email or web address]

## 2023-06-22 ENCOUNTER — Ambulatory Visit (INDEPENDENT_AMBULATORY_CARE_PROVIDER_SITE_OTHER): Payer: Medicare (Managed Care) | Admitting: Adult Health

## 2023-06-22 ENCOUNTER — Encounter (INDEPENDENT_AMBULATORY_CARE_PROVIDER_SITE_OTHER): Payer: Self-pay | Admitting: Adult Health

## 2023-06-22 DIAGNOSIS — E1159 Type 2 diabetes mellitus with other circulatory complications: Secondary | ICD-10-CM

## 2023-06-22 DIAGNOSIS — E1169 Type 2 diabetes mellitus with other specified complication: Secondary | ICD-10-CM | POA: Diagnosis not present

## 2023-06-22 DIAGNOSIS — F4321 Adjustment disorder with depressed mood: Secondary | ICD-10-CM

## 2023-06-22 DIAGNOSIS — E559 Vitamin D deficiency, unspecified: Secondary | ICD-10-CM | POA: Diagnosis not present

## 2023-06-22 DIAGNOSIS — Z7985 Long-term (current) use of injectable non-insulin antidiabetic drugs: Secondary | ICD-10-CM

## 2023-06-22 DIAGNOSIS — I152 Hypertension secondary to endocrine disorders: Secondary | ICD-10-CM

## 2023-06-22 DIAGNOSIS — E669 Obesity, unspecified: Secondary | ICD-10-CM

## 2023-06-22 DIAGNOSIS — Z6837 Body mass index (BMI) 37.0-37.9, adult: Secondary | ICD-10-CM

## 2023-06-22 NOTE — Progress Notes (Addendum)
 WEIGHT SUMMARY AND BIOMETRICS  Vitals Temp: 97.7 F (36.5 C) BP: (!) 158/71 (has not taken B/P meds yet, per patient) Pulse Rate: 69 SpO2: 97 %   Anthropometric Measurements Height: 5\' 8"  (1.727 m) Weight: 245 lb (111.1 kg) BMI (Calculated): 37.26 Weight at Last Visit: 239 lb Weight Lost Since Last Visit: 0 Weight Gained Since Last Visit: 6 lb Starting Weight: 277 lb Total Weight Loss (lbs): 32 lb (14.5 kg)   Body Composition  Body Fat %: 47.7 % Fat Mass (lbs): 117 lbs Muscle Mass (lbs): 121.8 lbs Total Body Water (lbs): 92.82 lbs Visceral Fat Rating : 15   Other Clinical Data Fasting: yes Labs: no Today's Visit #: 31 Starting Date: 05/10/20    Chief Complaint:   OBESITY Regina Christensen is here to discuss her progress with her obesity treatment plan.  She is on the the Category 1 Plan and states she is following her eating plan approximately 70 % of the time.  She states she is exercising: None   Interim History:  She continues to grieve the loss of her sister. Per Regina Christensen, her sister was her mentor, best friend, and confidant.  She endorses strong local support system of family/friends.  Hunger/appetite-she endorses increased snacking in between meals and larger meal portion sizes.  Exercise-None, she has new insurance and enrolled in [employer]- she has yet to take advantage of this program  Hydration-she estimates to drink 30 oz water/day  Reviewed Bioimpedance results with pt: Muscle Mass: +1.6 lbs Adipose Mass: + 4 lbs  Subjective:   1. Hypertension associated with type 2 diabetes mellitus (HCC) BP above goal at OV She denies acute cardiac sx's She has NOT taken her daily antihypertensives prior to OV  2. Type 2 diabetes mellitus with other specified complication, without long-term current use of insulin (HCC) She denies sx's of hypoglycemia. She is on   3. Vitamin D deficiency She has been inconsistently taking bi-weekly  Ergocalciferol Discussed the significant health benefits of Vit D Level >60- especially in regards to mood stabilization.  4. Grief reaction She continues to grieve the loss of her sister. Per Regina Christensen, her sister was her mentor, best friend, and confidant.  She endorses strong local support system of family/friends.  She has been inconsistently taking bi-weekly Ergocalciferol Discussed the significant health benefits of Vit D Level >60- especially in regards to mood stabilization.  Assessment/Plan:   1. Hypertension associated with type 2 diabetes mellitus (HCC) Check Labs - Comprehensive metabolic panel  2. Type 2 diabetes mellitus with other specified complication, without long-term current use of insulin (HCC) Check Labs - Hemoglobin A1c - Insulin, random  3. Vitamin D deficiency Check Labs - VITAMIN D 25 Hydroxy (Vit-D Deficiency, Fractures) Take bi-weekly Ergocalciferol  4. Grief reaction Increase regular exercise- discussed strategies  5. Obesity, current BMI 37.3  Regina Christensen is not currently in the action stage of change. As such, her goal is to get back to weightloss efforts . She has agreed to the Category 1 Plan.   Exercise goals: Older adults should follow the adult guidelines. When older adults cannot meet the adult guidelines, they should be as physically active as their abilities and conditions will allow.  Older adults should do exercises that maintain or improve balance if they are at risk of falling.  Older adults should determine their level of effort for physical activity relative to their level of fitness.  Older adults with chronic conditions should understand whether and how  their conditions affect their ability to do regular physical activity safely.  Behavioral modification strategies: increasing lean protein intake, decreasing simple carbohydrates, increasing vegetables, increasing water intake, no skipping meals, meal planning and cooking strategies,  keeping healthy foods in the home, ways to avoid boredom eating, better snacking choices, emotional eating strategies, and planning for success.  Regina Christensen has agreed to follow-up with our clinic in 4 weeks. She was informed of the importance of frequent follow-up visits to maximize her success with intensive lifestyle modifications for her multiple health conditions.   Regina Christensen was informed we would discuss her lab results at her next visit unless there is a critical issue that needs to be addressed sooner. Regina Christensen agreed to keep her next visit at the agreed upon time to discuss these results.  Objective:   Blood pressure (!) 158/71, pulse 69, temperature 97.7 F (36.5 C), height 5\' 8"  (1.727 m), weight 245 lb (111.1 kg), SpO2 97%. Body mass index is 37.25 kg/m.  General: Cooperative, alert, well developed, in no acute distress. HEENT: Conjunctivae and lids unremarkable. Cardiovascular: Regular rhythm.  Lungs: Normal work of breathing. Neurologic: No focal deficits.   Lab Results  Component Value Date   CREATININE 0.81 02/23/2023   BUN 10 02/23/2023   NA 143 02/23/2023   K 4.1 02/23/2023   CL 105 02/23/2023   CO2 25 02/23/2023   Lab Results  Component Value Date   ALT 13 02/23/2023   AST 18 02/23/2023   ALKPHOS 116 02/23/2023   BILITOT 0.4 02/23/2023   Lab Results  Component Value Date   HGBA1C 6.0 (H) 02/23/2023   HGBA1C 5.4 10/29/2022   HGBA1C 6.1 (A) 06/25/2022   HGBA1C 6.0 (H) 02/10/2022   HGBA1C 5.8 (H) 08/28/2021   Lab Results  Component Value Date   INSULIN 6.1 02/23/2023   INSULIN 8.0 08/28/2021   Lab Results  Component Value Date   TSH 1.359 03/02/2022   Lab Results  Component Value Date   CHOL 124 03/03/2023   HDL 77 03/03/2023   LDLCALC 34 03/03/2023   TRIG 57 03/03/2023   CHOLHDL 1.6 03/03/2023   Lab Results  Component Value Date   VD25OH 68.4 02/23/2023   VD25OH 60 02/10/2022   VD25OH 38.1 08/28/2021   Lab Results  Component Value  Date   WBC 7.0 05/14/2022   HGB 14.6 05/14/2022   HCT 44.9 05/14/2022   MCV 89.6 05/14/2022   PLT 291 05/14/2022   Lab Results  Component Value Date   IRON 39 11/14/2020   TIBC 329 11/14/2020   FERRITIN 25 11/14/2020   Attestation Statements:   Reviewed by clinician on day of visit: allergies, medications, problem list, medical history, surgical history, family history, social history, and previous encounter notes.  I have reviewed the above documentation for accuracy and completeness, and I agree with the above. -  Lucresia Simic d. Everitt Wenner, NP-C

## 2023-06-23 ENCOUNTER — Other Ambulatory Visit: Payer: Self-pay | Admitting: Family Medicine

## 2023-06-23 ENCOUNTER — Encounter (INDEPENDENT_AMBULATORY_CARE_PROVIDER_SITE_OTHER): Payer: Self-pay | Admitting: Adult Health

## 2023-06-23 ENCOUNTER — Telehealth: Payer: Self-pay | Admitting: Pharmacy Technician

## 2023-06-23 ENCOUNTER — Encounter: Payer: Self-pay | Admitting: Gastroenterology

## 2023-06-23 DIAGNOSIS — Z5986 Financial insecurity: Secondary | ICD-10-CM

## 2023-06-23 DIAGNOSIS — Z1231 Encounter for screening mammogram for malignant neoplasm of breast: Secondary | ICD-10-CM

## 2023-06-23 LAB — COMPREHENSIVE METABOLIC PANEL
ALT: 15 IU/L (ref 0–32)
AST: 14 IU/L (ref 0–40)
Albumin: 3.8 g/dL — ABNORMAL LOW (ref 3.9–4.9)
Alkaline Phosphatase: 100 IU/L (ref 44–121)
BUN/Creatinine Ratio: 16 (ref 12–28)
BUN: 12 mg/dL (ref 8–27)
Bilirubin Total: 0.4 mg/dL (ref 0.0–1.2)
CO2: 24 mmol/L (ref 20–29)
Calcium: 9 mg/dL (ref 8.7–10.3)
Chloride: 104 mmol/L (ref 96–106)
Creatinine, Ser: 0.77 mg/dL (ref 0.57–1.00)
Globulin, Total: 2.4 g/dL (ref 1.5–4.5)
Glucose: 81 mg/dL (ref 70–99)
Potassium: 4.2 mmol/L (ref 3.5–5.2)
Sodium: 141 mmol/L (ref 134–144)
Total Protein: 6.2 g/dL (ref 6.0–8.5)
eGFR: 85 mL/min/{1.73_m2} (ref >59)

## 2023-06-23 LAB — VITAMIN D 25 HYDROXY (VIT D DEFICIENCY, FRACTURES): Vit D, 25-Hydroxy: 30.6 ng/mL (ref 30.0–100.0)

## 2023-06-23 LAB — INSULIN, RANDOM: INSULIN: 10.1 u[IU]/mL (ref 2.6–24.9)

## 2023-06-23 LAB — HEMOGLOBIN A1C
Est. average glucose Bld gHb Est-mCnc: 120 mg/dL
Hgb A1c MFr Bld: 5.8 % — ABNORMAL HIGH (ref 4.8–5.6)

## 2023-06-23 NOTE — Progress Notes (Signed)
 Pharmacy Medication Assistance Program Note    06/23/2023  Patient ID: Regina Christensen, female  DOB: [birth year], 68 y.o.  MRN:  [id number]     05/20/2023 06/18/2023 06/23/2023  Outreach Medication Two  Initial Outreach Date (Medication Two) 02/20/2023    Manufacturer Medication Two Novo Nordisk    Nordisk Drugs Ozempic    Dose of Ozempic 8mg /12ml    Type of Radiographer, therapeutic Assistance    Date Application Sent to Patient 02/25/2023    Application Items Requested Application;Proof of Income;Other    Date Application Sent to Prescriber 02/25/2023    Name of Prescriber [name]    Date Application Received From Patient  06/16/2023   Application Items Received From Patient  Application;Other   Date Application Received From Provider  03/11/2023   Method Application Sent to Manufacturer  Fax   Date Application Submitted to Manufacturer  06/17/2023   Patient Assistance Determination   Approved  Approval Start Date   06/18/2023  Patient Notification Method   Telephone Call  Telephone Call Outcome   Left Voicemail   Care coordination call placed to Novo Nordisk in regard to Ozempic application.  Per [employer] IVR system, patient is APPROVED 06/18/23-04/20/24. Initial medication orders and subsequent refill orders will process automatically and be delivered to the prescriber's office. Patient may call Novo Nordisk at any time to check on shipments by calling [phone].   Unsuccessful outreach to patient. HIPAA compliant voicemail left requesting a call back. Was calling patient to update her on the above information. Will send in basket message to [facility] Pharmac[facility] Estelle Grumbles notifying her of this information as well as case closure as patient assistance is completed.  Regina Christensen, CPhT [place]  Office: [phone] Fax: [phone] Email: [email or web address]

## 2023-07-06 ENCOUNTER — Encounter: Payer: Self-pay | Admitting: Family Medicine

## 2023-07-06 ENCOUNTER — Ambulatory Visit: Payer: Medicare (Managed Care) | Admitting: Family Medicine

## 2023-07-06 VITALS — BP 136/78 | HR 76 | Resp 16 | Ht 68.0 in | Wt 247.5 lb

## 2023-07-06 DIAGNOSIS — E1129 Type 2 diabetes mellitus with other diabetic kidney complication: Secondary | ICD-10-CM

## 2023-07-06 DIAGNOSIS — I5022 Chronic systolic (congestive) heart failure: Secondary | ICD-10-CM | POA: Diagnosis not present

## 2023-07-06 DIAGNOSIS — R809 Proteinuria, unspecified: Secondary | ICD-10-CM

## 2023-07-06 DIAGNOSIS — G4733 Obstructive sleep apnea (adult) (pediatric): Secondary | ICD-10-CM

## 2023-07-06 DIAGNOSIS — F3342 Major depressive disorder, recurrent, in full remission: Secondary | ICD-10-CM

## 2023-07-06 DIAGNOSIS — Z8673 Personal history of transient ischemic attack (TIA), and cerebral infarction without residual deficits: Secondary | ICD-10-CM

## 2023-07-06 DIAGNOSIS — I480 Paroxysmal atrial fibrillation: Secondary | ICD-10-CM | POA: Diagnosis not present

## 2023-07-06 DIAGNOSIS — K219 Gastro-esophageal reflux disease without esophagitis: Secondary | ICD-10-CM

## 2023-07-06 DIAGNOSIS — I272 Pulmonary hypertension, unspecified: Secondary | ICD-10-CM

## 2023-07-06 DIAGNOSIS — I6789 Other cerebrovascular disease: Secondary | ICD-10-CM

## 2023-07-06 DIAGNOSIS — F411 Generalized anxiety disorder: Secondary | ICD-10-CM

## 2023-07-06 MED ORDER — VENLAFAXINE HCL ER 75 MG PO CP24: 75.0000 mg | ORAL_CAPSULE | Freq: Every morning | ORAL | 10 refills | Status: DC

## 2023-07-06 NOTE — Progress Notes (Signed)
 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 Unchanged   IMPRESSION: 2021 1.  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  2.  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  3.  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     MR Angio neck and head with and without contrast was normal     Paroxysmal Afib: She had an ablation done on 05/29/2022 by Dr. Lalla Brothers,  and doing well since , still on Eliquis . No recently palpitations , she states usually about once a month and lasts only a few seconds not associated with chest pain or SOB.    CHF chronic systolic : last Echo showed improvement .  She denies SOB with activity . She states lower extremity edema is very mild  She also has mild pulmonary hypertension and some mild to moderate MR and moderate to severe TR. She is on Spironolactone, SGL-2 agonist, Furosemide, metoprolol and ARB . She is doing well with current regiment, Echo has improved and clinically doing much better.    MDD/GAD: she was seeing Dr. Elna Breslow but is now only taking Effexor 75 mg and symptoms have been controlled and stable, she was advised to get refills from me and go back if needed. She is doing well at this time    HTN: she is currently on metoprolol , aldactone and Benicar. No chest pain , SOB or palpitation  She has been compliant with low salt diet and medications. BP was up when she went to wellness center but did not take medications that morning.    OSA/Pulmonary hypertension: explained importance of  resuming using her CPAP , she stopped using it a few months ago. She states pressure seems high. Discussed importance of compliance. She is wiling to see pulmonologist  Last Echo showed improvement of pulmonary pressure   Morbid obesity:  [facility], BMI over 35 with co-morbidities such as DM, HTN and CHF  She has a history of sleeve  bariatric surgery about 10 years ago, her weight prior to surgery was 300 lbs and  she lost 50 lbs and weight was between 250 lbs and 260 lbs for a while, but since started on Ozempic since 2023 and weight today is down 242 lbs but is trending up again, today it is 247 lbs. She states recently started going back to the gym.    History of bilateral knee replacement: right in 2021 and left Oct 2022, she no longer needs a cane.   DMII: with hypertension and dyslipidemia, diagnosed by the [facility]. She is still taking Ozempic and denies side effects, last A1C was done 06/2023 5.8 % . She  denies polyphagia, polyuria or polydipsia.Marland Kitchen Positive for urine micro in October 2023 but normalized in 2025 likely due to SGL-2 agonist and  ARB. She takes statin therapy for dyslipidemia    GERD: she is taking PPI and pepcid daily to control symptoms, she also states was given a diagnosis  of IBS diarrhea type many years ago, she also had bariatric surgery years ago and may be the cause of diarrhea. Seen by Dr. Servando Snare had colonoscopy negative for colitis, he advised imodium before going out.     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   Hypercoagulable state due to persistent atrial fibrillation (HCC) 06/26/2022   Abnormal CT of the abdomen 05/06/2022   Primary insomnia 04/25/2022   Chronic combined  systolic and diastolic heart failure (HCC) 03/06/2022   BMI 36.0-36.9,adult 03/04/2022   HFrEF (heart failure with reduced ejection fraction) (HCC) 03/03/2022   Primary hypertension 03/03/2022   Atrial fibrillation with RVR (HCC) 03/02/2022   Dilated cardiomyopathy (HCC) 03/02/2022   Nonrheumatic tricuspid valve regurgitation 03/02/2022   Osteopenia of necks of both femurs 01/22/2022   History of total bilateral knee replacement 09/10/2021   History of blood transfusion 09/10/2020   Current use of long term anticoagulation    Diabetes mellitus (HCC)    GERD without esophagitis    Vitamin D deficiency 04/12/2020   History of CVA (cerebrovascular accident) without residual deficits 03/05/2020   History of hysterectomy 07/19/2019   MDD (major depressive disorder), recurrent, in full remission (HCC) 07/19/2019   Insomnia due to mental condition 07/19/2019   Bilateral carpal tunnel syndrome 07/30/2016   OSA (obstructive sleep apnea) 03/07/2016   BPPV (benign paroxysmal positional vertigo) 08/28/2015   Hypertension associated with type 2 diabetes mellitus (HCC) 11/09/2014   GAD (generalized anxiety disorder) 11/09/2014   Acanthosis nigricans 11/09/2014   Morbid obesity (HCC) 01/06/2012    Past Surgical History:  Procedure Laterality Date   ABDOMINAL HYSTERECTOMY     ATRIAL FIBRILLATION ABLATION N/A 05/29/2022   Procedure: ATRIAL FIBRILLATION ABLATION;  Surgeon: Lanier Prude, MD;  Location: [facility];  Service: Cardiovascular;  Laterality: N/A;   BIOPSY  05/21/2023   Procedure: BIOPSY;  Surgeon: Midge Minium, MD;  Location: [facility];  Service: Endoscopy;;   BREAST EXCISIONAL BIOPSY Left    CARDIOVERSION N/A 03/05/2022   Procedure: CARDIOVERSION;  Surgeon: Debbe Odea, MD;  Location: [facility] OR[facility];  Service: Cardiovascular;  Laterality: N/A;   COLONOSCOPY WITH PROPOFOL N/A 05/21/2023   Procedure: COLONOSCOPY WITH PROPOFOL;  Surgeon: Midge Minium, MD;  Location: [facility]  ENDOSCOPY;  Service: Endoscopy;  Laterality: N/A;   ENDOMETRIAL ABLATION     x2   ENDOSCOPIC CONCHA BULLOSA RESECTION Bilateral 08/08/2020   Procedure: ENDOSCOPIC CONCHA BULLOSA RESECTION;  Surgeon: Vernie Murders, MD;  Location: [facility] OR[facility];  Service: ENT;  Laterality: Bilateral;   ETHMOIDECTOMY Right 08/08/2020   Procedure: ETHMOIDECTOMY, LEFT PARTIAL ETHMOIDECTOMY;  Surgeon: Vernie Murders, MD;  Location: [facility];  Service: ENT;  Laterality: Right;   IMAGE GUIDED SINUS SURGERY N/A 08/08/2020   Procedure: IMAGE GUIDED SINUS SURGERY, RIGHT FRONTAL SINUSOTOMY;  Surgeon: Vernie Murders, MD;  Location: [facility];  Service: ENT;  Laterality: N/A;   LAPAROSCOPIC SLEEVE GASTRECTOMY     MAXILLARY ANTROSTOMY Bilateral 08/08/2020   Procedure: MAXILLARY ANTROSTOMY with tissue;  Surgeon: Vernie Murders, MD;  Location: [facility] OR[facility];  Service: ENT;  Laterality: Bilateral;   NASAL TURBINATE REDUCTION  08/27/2020   Procedure: TURBINATE REDUCTION /SUBMUCOSAL DEBRIDEMENT;  Surgeon: Vernie Murders, MD;  Location: [facility] OR[facility];  Service: ENT;;   TOTAL KNEE ARTHROPLASTY Right 06/2019  TOTAL KNEE ARTHROPLASTY Left 01/31/2021   [facility]    Family History  Problem Relation Age of Onset   Diabetes Mother    High blood pressure Mother    Heart disease Father    Stroke Sister    Cancer Brother    Mental illness Neg Hx     Social History   Tobacco Use   Smoking status: Never   Smokeless tobacco: Never  Substance Use Topics   Alcohol use: No    Alcohol/week: 0.0 standard drinks of alcohol    Comment: rare     Current Outpatient Medications:    acetaminophen-codeine (TYLENOL #3) 300-30 MG tablet, Take 1 tablet by mouth every 4 (four) hours as needed for moderate pain (pain score 4-6)., Disp: 30 tablet, Rfl: 0   apixaban (ELIQUIS) 5 MG TABS tablet, Take 1 tablet (5 mg total) by mouth 2 (two) times daily., Disp: 180 tablet, Rfl: 3   atorvastatin (LIPITOR) 10 MG tablet, TAKE 1 TABLET BY MOUTH  ONCE DAILY, Disp: 30 tablet, Rfl: 10   Blood Pressure KIT, 1 Units by Does not apply route daily., Disp: 1 kit, Rfl: 0   diltiazem (CARDIZEM CD) 180 MG 24 hr capsule, Take 180 mg by mouth daily., Disp: , Rfl:    famotidine (PEPCID) 20 MG tablet, TAKE 1 TABLET BY MOUTH DAILY AS NEEDED FOR INDIGESTION, Disp: 30 tablet, Rfl: 10   fexofenadine (ALLEGRA) 180 MG tablet, Take 180 mg by mouth daily as needed., Disp: , Rfl:    furosemide (LASIX) 40 MG tablet, TAKE 1 TABLET BY MOUTH DAILY *REFILL REQUEST*, Disp: 90 tablet, Rfl: 10   gabapentin (NEURONTIN) 300 MG capsule, Take 300 mg by mouth 3 (three) times daily as needed (for pain)., Disp: , Rfl:    gatifloxacin (ZYMAXID) 0.5 % SOLN, Place 1 drop into the left eye 4 (four) times daily., Disp: , Rfl:    JARDIANCE 10 MG TABS tablet, TAKE 1 TABLET BY MOUTH BEFORE BREAKFAST DAILY, Disp: 30 tablet, Rfl: 10   ketorolac (ACULAR) 0.5 % ophthalmic solution, Place 1 drop into the left eye 4 (four) times daily., Disp: , Rfl:    meclizine (ANTIVERT) 12.5 MG tablet, Take 1 tablet (12.5 mg total) by mouth 3 (three) times daily as needed for dizziness., Disp: 30 tablet, Rfl: 0   metoprolol succinate (TOPROL-XL) 50 MG 24 hr tablet, TAKE 1 & 1/2 TABLETS BY MOUTH EVERY MORNING AND TAKE 1& 1/2 TABLET BY MOUTH EVERY DAY AT BEDTIME, Disp: 90 tablet, Rfl: 2   Multiple Vitamin (MULTIVITAMIN) tablet, Take 1 tablet by mouth daily. BariatricPal Multivitamin, Disp: , Rfl:    olmesartan (BENICAR) 20 MG tablet, Take 1 tablet (20 mg total) by mouth daily., Disp: 90 tablet, Rfl: 3   pantoprazole (PROTONIX) 40 MG tablet, TAKE 1 TABLET BY MOUTH ONCE DAILY, Disp: 30 tablet, Rfl: 10   potassium chloride (KLOR-CON M) 10 MEQ tablet, TAKE 1 TABLET BY MOUTH ONCE DAILY, Disp: 30 tablet, Rfl: 10   prednisoLONE acetate (PRED FORTE) 1 % ophthalmic suspension, Place 1 drop into the right eye 3 (three) times daily., Disp: , Rfl:    Semaglutide, 2 MG/DOSE, (OZEMPIC, 2 MG/DOSE,) 8 MG/3ML SOPN, INJECT  2MG  SUBCUTANEOUSLY ONCE WEEKLY, Disp: 3 mL, Rfl: 2   spironolactone (ALDACTONE) 25 MG tablet, TAKE 1/2 TABLET BY MOUTH ONCE DAILY, Disp: 15 tablet, Rfl: 10   triamcinolone (NASACORT) 55 MCG/ACT AERO nasal inhaler, Place 2 sprays into the nose daily., Disp: , Rfl:  triamcinolone cream (KENALOG) 0.1 %, Apply 1 Application topically 2 (two) times daily., Disp: 30 g, Rfl: 0   venlafaxine XR (EFFEXOR-XR) 75 MG 24 hr capsule, TAKE 1 CAPSULE BY MOUTH EVERY MORNING, Disp: 30 capsule, Rfl: 0   Vitamin D, Ergocalciferol, (DRISDOL) 1.25 MG (50000 UNIT) CAPS capsule, Take 1 capsule (50,000 Units total) by mouth every 14 (fourteen) days., Disp: 6 capsule, Rfl: 0  Allergies  Allergen Reactions   Hydrocodone-Acetaminophen Other (See Comments)    Extreme headache   Ace Inhibitors Cough   Hctz [Hydrochlorothiazide] Rash    face    I personally reviewed active problem list, medication list, allergies, family history with the patient/caregiver today.   ROS  Ten systems reviewed and is negative except as mentioned in HPI    Objective  Vitals:   07/06/23 0819  BP: 136/78  Pulse: 76  Resp: 16  SpO2: 99%  Weight: 247 lb 8 oz (112.3 kg)  Height: 5\' 8"  (1.727 m)    Body mass index is 37.63 kg/m.  Physical Exam  Constitutional: Patient appears well-developed and well-nourished. Obese  No distress.  HEENT: head atraumatic, normocephalic, pupils equal and reactive to light, neck supple Cardiovascular: Normal rate, regular rhythm and normal heart sounds.  No murmur heard. No BLE edema. Pulmonary/Chest: Effort normal and breath sounds normal. No respiratory distress. Abdominal: Soft.  There is no tenderness. Psychiatric: Patient has a normal mood and affect. behavior is normal. Judgment and thought content normal.   Recent Results (from the past 2160 hours)  Surgical pathology     Status: None   Collection Time: 05/21/23 12:00 AM  Result Value Ref Range   SURGICAL PATHOLOGY      SURGICAL  PATHOLOGY [facility] [address] [place], [place] [address] Telephone [phone] or [phone] Fax [phone]  REPORT OF SURGICAL PATHOLOGY   Accession #: [id number] Patient Name: REIA, VIERNES Visit # : [id number]  MRN: [id number] Physician: Midge Minium DOB/Age 12-23-1955 (Age: 68) Gender: F Collected Date: 05/21/2023 Received Date: 05/21/2023  FINAL DIAGNOSIS       1. Sigmoid  Colon Polyp, CBX x2 :       - TUBULAR ADENOMA (1).      - HYPERPLASTIC POLYP (1).      - NEGATIVE FOR HIGH-GRADE DYSPLASIA AND MALIGNANCY.       2. Terminal ileum, biopsy, CBX random :       - SMALL INTESTINAL MUCOSA WITH PROMINENT LYMPHOID AGGREGATES, WITHIN NORMAL      LIMITS.      - NEGATIVE FOR GRANULOMAS, DYSPLASIA, AND MALIGNANCY.       3. Colon, biopsy, CBX random :       - COLONIC MUCOSA WITH FOCAL LYMPHOID AGGREGATES, NON-SPECIFIC.      - NEGATIVE FOR MICROSCOPIC COLITIS, DYSPLASIA, AND MALIGNANCY.       ELECTR ONIC SIGNATURE : Rubinas Md, Delice Bison , Sports administrator, International aid/development worker  MICROSCOPIC DESCRIPTION  CASE COMMENTS STAINS USED IN DIAGNOSIS: H&E H&E H&E    CLINICAL HISTORY  SPECIMEN(S) OBTAINED 1. Sigmoid  Colon Polyp, CBX X2 2. Terminal ileum, biopsy, CBX Random 3. Colon, biopsy, CBX Random  SPECIMEN COMMENTS: 3. R/O microscopic colitis SPECIMEN CLINICAL INFORMATION: 1. Change in bowel habits, diarrhea.Colon polyps    Gross Description 1. "CBX Sigmoid colon polyp x2", received in formalin are 2 tan-pink tissue fragments that are 0.3 x 0.2 x 0.1 cm and 0.4.0.3 x 0.2 cm.The specimen is submitted in toto in 1 block (1A).  2. "CBX random terminal ileum", received in formalin is a single 0.5 x 0.4 x 0.1 cm tan-pink tissue fragment.The specimen is submitted in toto in 1 block (2A). 3. "CBX random colon R/O microscopic colitis", received in formalin is a 1.5 x 0.5 x 0.1 cm aggregate of multiple tan-pink tissue  fragments.The specimen is filtered and submitted in t oto in 1 block (3A).      AMG 05/21/2023        Report signed out from the following location(s) [place]. [facility] [address], [place] [address] CLIA #: [id number]  [facility] [address] [place], [place] [address] CLIA #: [id number]   Urinalysis, Complete     Status: Abnormal   Collection Time: 05/26/23  9:49 AM  Result Value Ref Range   Specific Gravity, UA <1.005 (L) 1.005 - 1.030   pH, UA 6.5 5.0 - 7.5   Color, UA Yellow Yellow   Appearance Ur Hazy (A) Clear   Leukocytes,UA Trace (A) Negative   Protein,UA Negative Negative/Trace   Glucose, UA Trace (A) Negative   Ketones, UA Negative Negative   RBC, UA Trace (A) Negative   Bilirubin, UA Negative Negative   Urobilinogen, Ur 0.2 0.2 - 1.0 mg/dL   Nitrite, UA Negative Negative   Microscopic Examination See below:   Microscopic Examination     Status: Abnormal   Collection Time: 05/26/23  9:49 AM   Urine  Result Value Ref Range   WBC, UA 11-30 (A) 0 - 5 /hpf   RBC, Urine 3-10 (A) 0 - 2 /hpf   Epithelial Cells (non renal) 0-10 0 - 10 /hpf   Bacteria, UA Many (A) None seen/Few  Comprehensive metabolic panel     Status: Abnormal   Collection Time: 06/22/23  8:40 AM  Result Value Ref Range   Glucose 81 70 - 99 mg/dL   BUN 12 8 - 27 mg/dL   Creatinine, Ser 9.32 0.57 - 1.00 mg/dL   eGFR 85 >35 TD/DUK/0.25   BUN/Creatinine Ratio 16 12 - 28   Sodium 141 134 - 144 mmol/L   Potassium 4.2 3.5 - 5.2 mmol/L   Chloride 104 96 - 106 mmol/L   CO2 24 20 - 29 mmol/L   Calcium 9.0 8.7 - 10.3 mg/dL   Total Protein 6.2 6.0 - 8.5 g/dL   Albumin 3.8 (L) 3.9 - 4.9 g/dL   Globulin, Total 2.4 1.5 - 4.5 g/dL   Bilirubin Total 0.4 0.0 - 1.2 mg/dL   Alkaline Phosphatase 100 44 - 121 IU/L   AST 14 0 - 40 IU/L   ALT 15 0 - 32 IU/L  Hemoglobin A1c     Status: Abnormal   Collection Time: 06/22/23  8:40 AM  Result Value Ref Range   Hgb A1c  MFr Bld 5.8 (H) 4.8 - 5.6 %    Comment:          Prediabetes: 5.7 - 6.4          Diabetes: >6.4          Glycemic control for adults with diabetes: <7.0    Est. average glucose Bld gHb Est-mCnc 120 mg/dL  VITAMIN D 25 Hydroxy (Vit-D Deficiency, Fractures)     Status: None   Collection Time: 06/22/23  8:40 AM  Result Value Ref Range   Vit D, 25-Hydroxy 30.6 30.0 - 100.0 ng/mL    Comment: Vitamin D deficiency has been defined by the Institute of Medicine and  an Endocrine Society practice guideline as a level of serum 25-OH vitamin D less than 20 ng/mL (1,2). The Endocrine Society went on to further define vitamin D insufficiency as a level between 21 and 29 ng/mL (2). 1. IOM (Institute of Medicine). 2010. Dietary reference    intakes for calcium and D. [place]: The    [employer]. 2. Holick MF, Binkley [place], Bischoff-Ferrari HA, et al.    Evaluation, treatment, and prevention of vitamin D    deficiency: an Endocrine Society clinical practice    guideline. JCEM. 2011 Jul; 96(7):1911-30.   Insulin, random     Status: None   Collection Time: 06/22/23  8:40 AM  Result Value Ref Range   INSULIN 10.1 2.6 - 24.9 uIU/mL    Diabetic Foot Exam:     PHQ2/9:    07/06/2023    8:18 AM 05/06/2023    3:09 PM 03/23/2023    3:38 PM 03/03/2023    8:05 AM 10/29/2022    8:48 AM  Depression screen PHQ 2/9  Decreased Interest 0 0 0 0 0  Down, Depressed, Hopeless 0 0 0 0 0  PHQ - 2 Score 0 0 0 0 0  Altered sleeping 0 0 0 0 0  Tired, decreased energy 0 0 0 0 0  Change in appetite 0 0 0 0 0  Feeling bad or failure about yourself  0 0 0 0 0  Trouble concentrating 0 0 0 0 0  Moving slowly or fidgety/restless 0 0 0 0 0  Suicidal thoughts 0 0 0 0 0  PHQ-9 Score 0 0 0 0 0  Difficult doing work/chores Not difficult at all Not difficult at all Not difficult at all      phq 9 is negative  Fall Risk:    07/06/2023    8:12 AM 05/06/2023    3:09 PM 03/23/2023    3:37 PM 03/03/2023     8:05 AM 10/29/2022    8:47 AM  Fall Risk   Falls in the past year? 0 0 0 0 0  Number falls in past yr: 0 0 0 0 0  Injury with Fall? 0 0 0 0 0  Risk for fall due to : No Fall Risks No Fall Risks No Fall Risks No Fall Risks No Fall Risks  Follow up Falls prevention discussed;Education provided;Falls evaluation completed Falls prevention discussed;Education provided;Falls evaluation completed Falls prevention discussed;Education provided;Falls evaluation completed Falls prevention discussed Falls prevention discussed     Assessment & Plan  1. Chronic systolic heart failure (HCC) (Primary)  On medical management   2. Morbid obesity (HCC)  Going to wellness clinic   3. Paroxysmal atrial fibrillation (HCC)  Rate controlled since ablation   4. Pulmonary hypertension (HCC)  - Ambulatory referral to Pulmonology  5. Controlled type 2 diabetes mellitus with microalbuminuria, without long-term current use of insulin (HCC)  Controlled  6. MDD (major depressive disorder), recurrent, in full remission (HCC)  - venlafaxine XR (EFFEXOR-XR) 75 MG 24 hr capsule; Take 1 capsule (75 mg total) by mouth every morning.  Dispense: 30 capsule; Refill: 10  7. GERD without esophagitis  stable  8. History of CVA (cerebrovascular accident) without residual deficits  Not done   9. OSA (obstructive sleep apnea)  - Ambulatory referral to Pulmonology  10. Cerebral microvascular disease  On statin and eliquis  11. GAD (generalized anxiety disorder)  - venlafaxine XR (EFFEXOR-XR) 75 MG 24 hr capsule; Take 1 capsule (75 mg total) by  mouth every morning.  Dispense: 30 capsule; Refill: 10

## 2023-07-06 NOTE — Addendum Note (Signed)
 Addended by: [name] F on: 07/06/2023 12:43 PM   Modules accepted: Level of Service

## 2023-07-08 LAB — HM DIABETES EYE EXAM

## 2023-07-13 ENCOUNTER — Encounter: Payer: Self-pay | Admitting: Family Medicine

## 2023-07-14 ENCOUNTER — Ambulatory Visit
Admission: RE | Admit: 2023-07-14 | Discharge: 2023-07-14 | Disposition: A | Discharge status: Home / Self Care | Payer: Medicare (Managed Care) | Source: Ambulatory Visit | Attending: Family Medicine | Admitting: Family Medicine

## 2023-07-14 DIAGNOSIS — Z1231 Encounter for screening mammogram for malignant neoplasm of breast: Secondary | ICD-10-CM | POA: Diagnosis not present

## 2023-07-15 ENCOUNTER — Other Ambulatory Visit: Payer: Self-pay | Admitting: Pharmacist

## 2023-07-15 DIAGNOSIS — E119 Type 2 diabetes mellitus without complications: Secondary | ICD-10-CM

## 2023-07-15 DIAGNOSIS — I5020 Unspecified systolic (congestive) heart failure: Secondary | ICD-10-CM

## 2023-07-15 DIAGNOSIS — I480 Paroxysmal atrial fibrillation: Secondary | ICD-10-CM

## 2023-07-15 NOTE — Progress Notes (Signed)
 07/15/2023 Name: Regina Christensen MRN: [id number] DOB: May 29, 1955  Chief Complaint  Patient presents with   Medication Assistance   Medication Management    Regina Christensen is a 68 y.o. year old female who presented for a telephone visit.   They were referred to the pharmacist by their PCP for assistance in managing medication access.      Subjective:   Care Team: Primary Care Provider: [name], MD; Next Scheduled Visit: 11/05/2023 Cardiologist: Debbe Odea, MD Heart Failure Specialist: Delma Freeze, FNP Urologist: Hulan Fray; Next Scheduled Visit: 08/25/2023 Neurologist: Gelene Mink, PA; Next Scheduled Visit: 08/10/2023 [facility]: Julaine Fusi, NP; Next Scheduled Visit: 07/21/2023 Pulmonologist: Erin Fulling, MD; Next Scheduled Visit: 07/23/2023     Medication Access/Adherence  Current Pharmacy:  [facility], [place] - [address] [place] [address] Phone: [phone] Fax: [phone]  [facility] [place], [place] - [address] [place] [address] Phone: [phone] Fax: [phone]   Patient reports affordability concerns with their medications: No Patient reports access/transportation concerns to their pharmacy: No  Patient reports adherence concerns with their medications:  No       Diabetes:   Current medications:  - Ozempic 2 mg weekly - Jardiance 10 mg daily before breakfast   Current glucose readings: morning fasting ranging: 101-109   Patient denies hypoglycemic s/sx including dizziness, shakiness, sweating.    Statin therapy: atorvastatin 10 mg daily   Current medication access support:  - Enrolled in patient assistance for Ozempic from [employer] for 2025 through 04/20/2024             Reports has ~1 month supply remaining - Enrolled in [employer] Cardiomyopathy grant  through 10/26/2023     Heart Failure/HTN:   Current medications:  ACEi/ARB/ARNI: olmesartan 20 mg daily SGLT2i: Jardiance 10 mg daily Beta blocker: metoprolol ER 50 mg - 1.5 tablets (75 mg) twice daily Mineralocorticoid Receptor Antagonist: spironolactone 25 mg - 1/2 tablet (12.5 mg) daily Diuretic regimen: furosemide 40 mg daily as needed   Denies checking home blood pressure recently as needs to obtain new monitor   Denies symptoms of hypotension such as dizziness or lightheadedness   Patient denies volume overload signs or symptoms today     Current medication access support: Enrolled in [employer] Cardiomyopathy grant through 10/26/2023     Atrial Fibrillation:   Current medications: Rate Control: metoprolol ER 50 mg - 1.5 tablets (75 mg) twice daily Anticoagulation Regimen: Eliquis 5 mg twice daily   Denies checking home blood pressure recently as needs to obtain new monitor       Objective:  Lab Results  Component Value Date   HGBA1C 5.8 (H) 06/22/2023    Lab Results  Component Value Date   CREATININE 0.77 06/22/2023   BUN 12 06/22/2023   NA 141 06/22/2023   K 4.2 06/22/2023   CL 104 06/22/2023   CO2 24 06/22/2023    Lab Results  Component Value Date   CHOL 124 03/03/2023   HDL 77 03/03/2023   LDLCALC 34 03/03/2023   TRIG 57 03/03/2023   CHOLHDL 1.6 03/03/2023   BP Readings from Last 3 Encounters:  07/06/23 136/78  06/22/23 (!) 158/71  06/10/23 130/68   Pulse Readings from Last 3 Encounters:  07/06/23 76  06/22/23 69  06/10/23 74    Medications Reviewed Today  Reviewed by Manuela Neptune, RPH-CPP (Pharmacist) on 07/15/23 at 0950  Med List Status: <None>   Medication Order Taking? Sig Documenting Provider Last Dose Status Informant  apixaban (ELIQUIS) 5 MG TABS tablet [id number] Yes Take 1 tablet (5 mg total) by mouth 2 (two) times daily. Debbe Odea, MD Taking Active   atorvastatin (LIPITOR) 10 MG tablet [id number]  Yes TAKE 1 TABLET BY MOUTH ONCE DAILY [name], MD Taking Active   Blood Pressure KIT [id number]  1 Units by Does not apply route daily. William Hamburger D, NP  Active   famotidine (PEPCID) 20 MG tablet [id number] Yes TAKE 1 TABLET BY MOUTH DAILY AS NEEDED FOR Loney Hering, Danna Hefty, MD Taking Active   fexofenadine (ALLEGRA) 180 MG tablet [id number] Yes Take 180 mg by mouth daily as needed. [provider] Taking Active   furosemide (LASIX) 40 MG tablet [id number] Yes TAKE 1 TABLET BY MOUTH DAILY *REFILL REQUESTAlba Cory, MD Taking Active            Med Note Doree Fudge Dec 25, 2022  8:43 AM) Leanora Ivanoff as needed for wt gain  gabapentin (NEURONTIN) 300 MG capsule [id number]  Take 300 mg by mouth 3 (three) times daily as needed (for pain). [provider]  Active   JARDIANCE 10 MG TABS tablet [id number] Yes TAKE 1 TABLET BY MOUTH BEFORE BREAKFAST DAILY Forrest, Jarold Song, FNP Taking Active   ketorolac (ACULAR) 0.5 % ophthalmic solution [id number] Yes Place 1 drop into the left eye 4 (four) times daily. [provider] Taking Active   meclizine (ANTIVERT) 12.5 MG tablet [id number]  Take 1 tablet (12.5 mg total) by mouth 3 (three) times daily as needed for dizziness. [name], MD  Active   metoprolol succinate (TOPROL-XL) 50 MG 24 hr tablet [id number] Yes TAKE 1 & 1/2 TABLETS BY MOUTH EVERY MORNING AND TAKE 1& 1/2 TABLET BY MOUTH EVERY DAY AT BEDTIME Lanier Prude, MD Taking Active   Multiple Vitamin (MULTIVITAMIN) tablet [id number] Yes Take 1 tablet by mouth daily. BariatricPal Multivitamin [provider] Taking Active Self  olmesartan (BENICAR) 20 MG tablet [id number] Yes Take 1 tablet (20 mg total) by mouth daily. Debbe Odea, MD Taking Active   pantoprazole (PROTONIX) 40 MG tablet [id number] Yes TAKE 1 TABLET BY MOUTH ONCE DAILY [name], MD Taking Active   potassium chloride (KLOR-CON M) 10 MEQ tablet [id number] Yes TAKE 1 TABLET  BY MOUTH ONCE DAILY Sowles, Danna Hefty, MD Taking Active   prednisoLONE acetate (PRED FORTE) 1 % ophthalmic suspension [id number] Yes Place 1 drop into the left eye 3 (three) times daily. [provider] Taking Active   Semaglutide, 2 MG/DOSE, (OZEMPIC, 2 MG/DOSE,) 8 MG/3ML SOPN [id number] Yes INJECT 2MG  SUBCUTANEOUSLY ONCE WEEKLY [name], MD Taking Active   spironolactone (ALDACTONE) 25 MG tablet [id number] Yes TAKE 1/2 TABLET BY MOUTH ONCE DAILY Delma Freeze, FNP Taking Active   triamcinolone (NASACORT) 55 MCG/ACT AERO nasal inhaler [id number] Yes Place 2 sprays into the nose daily. [provider] Taking Active Self  triamcinolone cream (KENALOG) 0.1 % [id number]  Apply 1 Application topically 2 (two) times daily.  Patient taking differently: Apply 1 Application topically 2 (two) times daily as needed.   [name], MD  Active   venlafaxine XR (EFFEXOR-XR) 75 MG 24 hr capsule [id number] Yes Take 1 capsule (75 mg total) by mouth every morning. [name], MD Taking Active   Vitamin D, Ergocalciferol, (DRISDOL) 1.25 MG (50000 UNIT) CAPS  capsule [id number] Yes Take 1 capsule (50,000 Units total) by mouth every 14 (fourteen) days. Thomasene Lot, DO Taking Active               Assessment/Plan:   Diabetes: - Currently controlled - Recommend to check glucose, keep log of results and have this record to review at upcoming medical appointments. Patient to contact provider office sooner if needed for readings outside of established parameters or symptoms - Patient to follow up with Ozempic patient assistance as needed for refills   Hypertension/ Heart Failure/Atrial Fibrillation: - Reviewed appropriate blood pressure monitoring technique and reviewed goal blood pressure - Reviewed to weigh daily and when to contact cardiology with weight gain - Encourage patient to consider obtaining a new upper arm blood pressure monitor. Have discussed importance of having  correct cuff size for accuracy of readings - Recommend to monitor home blood pressure, keep log of results and have this record to review at upcoming medical appointments. Patient to contact provider office sooner if needed for readings outside of established parameters or symptoms       Follow Up Plan: Clinical Pharmacist will follow up with patient by telephone on 09/30/2023 at 9:00 AM    Estelle Grumbles, PharmD, [facility] [phone]

## 2023-07-15 NOTE — Patient Instructions (Signed)
 Goals Addressed             This Visit's Progress    Pharmacy Goals       The goal A1c is less than 7%. This is the best way to reduce the risk of the long term complications of diabetes, including heart disease, kidney disease, eye disease, strokes, and nerve damage. An A1c of less than 7% corresponds with fasting sugars less than 130 and 2 hour after meal sugars less than 180.   If you need to reach out to patient assistance programs regarding refills or to find out the status of your application, you can do so by calling:  [employer] at [phone]  Thank you!  Estelle Grumbles, PharmD, [facility] [phone]

## 2023-07-21 ENCOUNTER — Ambulatory Visit (INDEPENDENT_AMBULATORY_CARE_PROVIDER_SITE_OTHER): Payer: Medicare (Managed Care) | Admitting: Adult Health

## 2023-07-23 ENCOUNTER — Ambulatory Visit: Payer: Medicare (Managed Care) | Admitting: Internal Medicine

## 2023-07-23 ENCOUNTER — Encounter: Payer: Self-pay | Admitting: Internal Medicine

## 2023-07-23 VITALS — BP 124/72 | HR 82 | Ht 68.0 in | Wt 245.0 lb

## 2023-07-23 DIAGNOSIS — G4733 Obstructive sleep apnea (adult) (pediatric): Secondary | ICD-10-CM

## 2023-07-23 NOTE — Patient Instructions (Signed)
 Obtain Split Night Sleep Study to assess for sleep apnea Avoid Allergens and Irritants Avoid secondhand smoke Avoid SICK contacts Recommend  Masking  when appropriate Recommend Keep up-to-date with vaccinations Follow up Cardiology

## 2023-07-23 NOTE — Progress Notes (Signed)
 Name: Regina Christensen MRN: [id number] DOB: 1955/09/09    CHIEF COMPLAINT:  EXCESSIVE DAYTIME SLEEPINESS Assessment of sleep apnea   HISTORY OF PRESENT ILLNESS:  CARDIAC HISTORY H/O HTN, paroxymal Afib s/p ablation 05/2022, CVA 02/2020 HfpEF, obesity, OSA  Echo 2021 LVEF 55-60%  November 2023 showed EF of 40 to 45%.   Echo July 2024 showed normalized EF 60 to 65%, G1DD.  Patient is seen today for problems and issues with sleep related to excessive daytime sleepiness Patient  has been having sleep problems for many years Patient has been having excessive daytime sleepiness for a long time Patient has been having extreme fatigue and tiredness, lack of energy +  very Loud snoring every night + struggling breathe at night and gasps for air + morning headaches + Nonrefreshing sleep  Discussed sleep data and reviewed with patient.  Encouraged proper weight management.  Discussed driving precautions and its relationship with hypersomnolence.  Discussed operating dangerous equipment and its relationship with hypersomnolence.  Discussed sleep hygiene, and benefits of a fixed sleep waked time.  The importance of getting eight or more hours of sleep discussed with patient.  Discussed limiting the use of the computer and television before bedtime.  Decrease naps during the day, so night time sleep will become enhanced.  Limit caffeine, and sleep deprivation.  HTN, stroke, and heart failure are potential risk factors.    EPWORTH SLEEP SCORE 4  Patient with diagnosis of obstructive sleep apnea several years ago patient has not used her CPAP over the last 1 year Patient states the pressures are too high Patient does use some type of nasal mask  No exacerbation at this time No evidence of heart failure at this time No evidence or signs of infection at this time No respiratory distress No fevers, chills, nausea, vomiting, diarrhea No evidence of lower extremity edema No evidence  hemoptysis   PAST MEDICAL HISTORY :   has a past medical history of Anxiety, Arthritis, Back pain, BPPV (benign paroxysmal positional vertigo), CHF (congestive heart failure) (HCC), Chronic diastolic HF (heart failure) (HCC), Complication of anesthesia, Current use of long term anticoagulation, CVA (cerebral vascular accident) (HCC) (02/28/2020), Depression, Edema of both lower extremities, GERD (gastroesophageal reflux disease), Hypertension, IBS (irritable bowel syndrome), Lactose intolerance, Morbid obesity with BMI of 45.0-49.9, adult (HCC), Obesity, Osteoarthritis, PAF (paroxysmal atrial fibrillation) (HCC), Pre-diabetes, Sleep apnea, Type 2 diabetes mellitus without complication (HCC), and Vitamin D deficiency.  has a past surgical history that includes Abdominal hysterectomy; Endometrial ablation; Breast excisional biopsy (Left); Total knee arthroplasty (Right, 06/2019); LAPAROSCOPIC SLEEVE GASTRECTOMY; Image guided sinus surgery (N/A, 08/08/2020); Ethmoidectomy (Right, 08/08/2020); Maxillary antrostomy (Bilateral, 08/08/2020); Endoscopic concha bullosa resection (Bilateral, 08/08/2020); Nasal turbinate reduction (08/27/2020); Total knee arthroplasty (Left, 01/31/2021); Cardioversion (N/A, 03/05/2022); ATRIAL FIBRILLATION ABLATION (N/A, 05/29/2022); Colonoscopy with propofol (N/A, 05/21/2023); and biopsy (05/21/2023). Prior to Admission medications   Medication Sig Start Date End Date Taking? Authorizing Provider  apixaban (ELIQUIS) 5 MG TABS tablet Take 1 tablet (5 mg total) by mouth 2 (two) times daily. 04/23/23   Debbe Odea, MD  atorvastatin (LIPITOR) 10 MG tablet TAKE 1 TABLET BY MOUTH ONCE DAILY 06/17/23   [name], MD  Blood Pressure KIT 1 Units by Does not apply route daily. 01/26/23   Danford, Orpha Bur D, NP  famotidine (PEPCID) 20 MG tablet TAKE 1 TABLET BY MOUTH DAILY AS NEEDED FOR INDIGESTION 06/17/23   [name], MD  fexofenadine (ALLEGRA) 180 MG tablet Take 180 mg by mouth  daily  as needed.    [provider]  furosemide (LASIX) 40 MG tablet TAKE 1 TABLET BY MOUTH DAILY *REFILL REQUEST* 12/23/22   [name], MD  gabapentin (NEURONTIN) 300 MG capsule Take 300 mg by mouth 3 (three) times daily as needed (for pain).    [provider]  JARDIANCE 10 MG TABS tablet TAKE 1 TABLET BY MOUTH BEFORE BREAKFAST DAILY 12/23/22   Clarisa Kindred A, FNP  ketorolac (ACULAR) 0.5 % ophthalmic solution Place 1 drop into the left eye 4 (four) times daily. 05/29/23   [provider]  meclizine (ANTIVERT) 12.5 MG tablet Take 1 tablet (12.5 mg total) by mouth 3 (three) times daily as needed for dizziness. 06/25/22   [name], MD  metoprolol succinate (TOPROL-XL) 50 MG 24 hr tablet TAKE 1 & 1/2 TABLETS BY MOUTH EVERY MORNING AND TAKE 1& 1/2 TABLET BY MOUTH EVERY DAY AT BEDTIME 12/24/22   Lanier Prude, MD  Multiple Vitamin (MULTIVITAMIN) tablet Take 1 tablet by mouth daily. BariatricPal Multivitamin    [provider]  olmesartan (BENICAR) 20 MG tablet Take 1 tablet (20 mg total) by mouth daily. 10/07/22   Debbe Odea, MD  pantoprazole (PROTONIX) 40 MG tablet TAKE 1 TABLET BY MOUTH ONCE DAILY 06/17/23   [name], MD  potassium chloride (KLOR-CON M) 10 MEQ tablet TAKE 1 TABLET BY MOUTH ONCE DAILY 04/16/23   [name], MD  prednisoLONE acetate (PRED FORTE) 1 % ophthalmic suspension Place 1 drop into the left eye 3 (three) times daily. 05/29/23   [provider]  Semaglutide, 2 MG/DOSE, (OZEMPIC, 2 MG/DOSE,) 8 MG/3ML SOPN INJECT 2MG  SUBCUTANEOUSLY ONCE WEEKLY 12/23/22   [name], MD  spironolactone (ALDACTONE) 25 MG tablet TAKE 1/2 TABLET BY MOUTH ONCE DAILY 12/23/22   Clarisa Kindred A, FNP  triamcinolone (NASACORT) 55 MCG/ACT AERO nasal inhaler Place 2 sprays into the nose daily.    [provider]  triamcinolone cream (KENALOG) 0.1 % Apply 1 Application topically 2 (two) times daily. Patient taking differently: Apply  1 Application topically 2 (two) times daily as needed. 05/06/23   [name], MD  venlafaxine XR (EFFEXOR-XR) 75 MG 24 hr capsule Take 1 capsule (75 mg total) by mouth every morning. 07/06/23   [name], MD  Vitamin D, Ergocalciferol, (DRISDOL) 1.25 MG (50000 UNIT) CAPS capsule Take 1 capsule (50,000 Units total) by mouth every 14 (fourteen) days. 05/11/23   Thomasene Lot, DO   Allergies  Allergen Reactions   Hydrocodone-Acetaminophen Other (See Comments)    Extreme headache   Ace Inhibitors Cough   Hctz [Hydrochlorothiazide] Rash    face    FAMILY HISTORY:  family history includes Cancer in her brother; Diabetes in her mother; Heart disease in her father; High blood pressure in her mother; Stroke in her sister. SOCIAL HISTORY:  reports that she has never smoked. She has never used smokeless tobacco. She reports that she does not drink alcohol and does not use drugs.   Review of Systems:  Gen:  Denies  fever, sweats, chills weight loss  HEENT: Denies blurred vision, double vision, ear pain, eye pain, hearing loss, nose bleeds, sore throat Cardiac:  No dizziness, chest pain or heaviness, chest tightness,edema, No JVD Resp:   No cough, -sputum production, -shortness of breath,-wheezing, -hemoptysis,  Gi: Denies swallowing difficulty, stomach pain, nausea or vomiting, diarrhea, constipation, bowel incontinence Gu:  Denies bladder incontinence, burning urine Ext:   Denies Joint pain, stiffness or swelling Skin: Denies  skin rash, easy  bruising or bleeding or hives Endoc:  Denies polyuria, polydipsia , polyphagia or weight change Psych:   Denies depression, insomnia or hallucinations  Other:  All other systems negative   ALL OTHER ROS ARE NEGATIVE  BP 124/72   Pulse 82   Ht 5\' 8"  (1.727 m)   Wt 245 lb (111.1 kg)   SpO2 96%   BMI 37.25 kg/m     Physical Examination:   General Appearance: No distress  EYES PERRLA, EOM intact.   NECK Supple, No JVD ORAL CAVITY  MALLAMPATI 4 Pulmonary: normal breath sounds, No wheezing.  CardiovascularNormal S1,S2.  No m/r/g.   Abdomen: Benign, Soft, non-tender. Skin:   warm, no rashes, no ecchymosis  Extremities: normal, no cyanosis, clubbing. Neuro:without focal findings,  speech normal  PSYCHIATRIC: Mood, affect within normal limits.   ALL OTHER ROS ARE NEGATIVE    ASSESSMENT AND PLAN SYNOPSIS 68 year old morbidly obese female  Patient with signs and symptoms of excessive daytime sleepiness with  underlying diagnosis of obstructive sleep apnea in the setting of obesity and deconditioned state   Recommend Sleep Study for definitve diagnosis Due to her cardiac history and history of A-fib and stroke on anticoagulation and recommend split-night sleep study for complete evaluation and assessment  Paroxysmal A-fib cardiomyopathy Follow-up with cardiology Continue anticoagulation as prescribed  Obesity -recommend significant weight loss -recommend changing diet  Deconditioned state -Recommend increased daily activity and exercise   Hypertension - Sleep apnea can contribute to hypertension, therefore treatment of sleep apnea is important part of hypertension management.   Atrial Fibrillation - Sleep apnea can contribute to Atrial Fibrillation, therefore treatment of sleep apnea is important part of A fib management.   MEDICATION ADJUSTMENTS/LABS AND TESTS ORDERED: Recommend split-night sleep study Avoid Allergens and Irritants Avoid secondhand smoke Avoid SICK contacts Recommend  Masking  when appropriate Recommend Keep up-to-date with vaccinations Recommend weight loss   CURRENT MEDICATIONS REVIEWED AT LENGTH WITH PATIENT TODAY   Patient  satisfied with Plan of action and management. All questions answered  Follow up  3 months   I spent a total of  65 minutes reviewing chart data, face-to-face evaluation with the patient, counseling and coordination of care as detailed above.     Lucie Leather, M.D.  [facility]  Medical Director [facility] Medical Director [facility] Cardio-Pulmonary Department

## 2023-07-27 ENCOUNTER — Ambulatory Visit (INDEPENDENT_AMBULATORY_CARE_PROVIDER_SITE_OTHER): Payer: Medicare (Managed Care) | Admitting: Adult Health

## 2023-07-30 ENCOUNTER — Encounter (INDEPENDENT_AMBULATORY_CARE_PROVIDER_SITE_OTHER): Payer: Self-pay

## 2023-08-10 DIAGNOSIS — Z8673 Personal history of transient ischemic attack (TIA), and cerebral infarction without residual deficits: Secondary | ICD-10-CM | POA: Diagnosis not present

## 2023-08-10 DIAGNOSIS — R42 Dizziness and giddiness: Secondary | ICD-10-CM | POA: Diagnosis not present

## 2023-08-10 DIAGNOSIS — G3184 Mild cognitive impairment, so stated: Secondary | ICD-10-CM | POA: Diagnosis not present

## 2023-08-10 DIAGNOSIS — R519 Headache, unspecified: Secondary | ICD-10-CM | POA: Diagnosis not present

## 2023-08-12 ENCOUNTER — Ambulatory Visit: Payer: Medicare (Managed Care)

## 2023-08-12 VITALS — Ht 68.0 in | Wt 240.0 lb

## 2023-08-12 DIAGNOSIS — Z0000 Encounter for general adult medical examination without abnormal findings: Secondary | ICD-10-CM

## 2023-08-12 NOTE — Patient Instructions (Addendum)
 Regina Christensen , Thank you for taking time to come for your Medicare Wellness Visit. I appreciate your ongoing commitment to your health goals. Please review the following plan we discussed and let me know if I can assist you in the future.   Referrals/Orders/Follow-Ups/Clinician Recommendations: Yes, Keep maintaining your health by keeping your appointments with Dr. Ava [name] and any specialists that you may see.  Call us  if you need anything.  Have a great year!!!!  This is a list of the screening recommended for you and due dates:  Health Maintenance  Topic Date Due   COVID-19 Vaccine (4 - 2024-25 season) 12/21/2022   Complete foot exam   10/29/2023   Flu Shot  11/20/2023   Hemoglobin A1C  12/23/2023   Yearly kidney health urinalysis for diabetes  03/02/2024   Yearly kidney function blood test for diabetes  06/21/2024   Eye exam for diabetics  07/07/2024   Medicare Annual Wellness Visit  08/11/2024   Mammogram  07/13/2025   Colon Cancer Screening  05/20/2030   DTaP/Tdap/Td vaccine (2 - Td or Tdap) 03/14/2031   Pneumonia Vaccine  Completed   DEXA scan (bone density measurement)  Completed   Hepatitis C Screening  Completed   Zoster (Shingles) Vaccine  Completed   HPV Vaccine  Aged Out   Meningitis B Vaccine  Aged Out    Advanced directives: (Declined) Advance directive discussed with you today. Even though you declined this today, please call our office should you change your mind, and we can give you the proper paperwork for you to fill out.  Next Medicare Annual Wellness Visit scheduled for next year: Yes, It was nice speaking with you today! Your next Annual Wellness Visit is scheduled for 08/12/2024 at 8:50 a.m. via PHONE VISIT. If you need to reschedule or cancel, please call [phone].

## 2023-08-12 NOTE — Progress Notes (Addendum)
 Because this visit was a virtual/telehealth visit,  certain criteria was not obtained, such a blood pressure, CBG if applicable, and timed get up and go. Any medications not marked as "taking" were not mentioned during the medication reconciliation part of the visit. Any vitals not documented were not able to be obtained due to this being a telehealth visit or patient was unable to self-report a recent blood pressure reading due to a lack of equipment at home via telehealth. Vitals that have been documented are verbally provided by the patient.   Subjective:   Regina Christensen is a 68 y.o. who presents for a Medicare Wellness preventive visit.  Visit Complete: Virtual I connected with  Regina Christensen on 08/12/23 by a audio enabled telemedicine application and verified that I am speaking with the correct person using two identifiers.  Patient Location: Home  Provider Location: Office/Clinic  I discussed the limitations of evaluation and management by telemedicine. The patient expressed understanding and agreed to proceed.  Vital Signs: Because this visit was a virtual/telehealth visit, some criteria may be missing or patient reported. Any vitals not documented were not able to be obtained and vitals that have been documented are patient reported.  VideoDeclined- This patient declined Librarian, academic. Therefore the visit was completed with audio only.  Persons Participating in Visit: Patient.  AWV Questionnaire: No: Patient Medicare AWV questionnaire was not completed prior to this visit.  Cardiac Risk Factors include: advanced age (>72men, >52 women);diabetes mellitus;family history of premature cardiovascular disease;hypertension;obesity (BMI >30kg/m2);dyslipidemia;sedentary lifestyle     Objective:    Today's Vitals   08/12/23 0837  Weight: 240 lb (108.9 kg)  Height: 5\' 8"  (1.727 m)  PainSc: 0-No pain   Body mass index is 36.49 kg/m.      08/12/2023    8:39 AM 05/21/2023    8:23 AM 10/28/2022    8:27 AM 05/29/2022   10:58 AM 03/05/2022    7:52 AM 03/02/2022   10:01 AM 08/27/2020    7:08 AM  Advanced Directives  Does Patient Have a Medical Advance Directive? No No No No No No No  Would patient like information on creating a medical advance directive? No - Patient declined  Yes (MAU/Ambulatory/Procedural Areas - Information given) No - Patient declined No - Patient declined No - Guardian declined No - Patient declined    Current Medications (verified) Outpatient Encounter Medications as of 08/12/2023  Medication Sig   apixaban  (ELIQUIS ) 5 MG TABS tablet Take 1 tablet (5 mg total) by mouth 2 (two) times daily.   atorvastatin  (LIPITOR) 10 MG tablet TAKE 1 TABLET BY MOUTH ONCE DAILY   Blood Pressure KIT 1 Units by Does not apply route daily.   famotidine  (PEPCID ) 20 MG tablet TAKE 1 TABLET BY MOUTH DAILY AS NEEDED FOR INDIGESTION   fexofenadine (ALLEGRA) 180 MG tablet Take 180 mg by mouth daily as needed.   furosemide  (LASIX ) 40 MG tablet TAKE 1 TABLET BY MOUTH DAILY *REFILL REQUEST*   gabapentin  (NEURONTIN ) 300 MG capsule Take 300 mg by mouth 3 (three) times daily as needed (for pain).   JARDIANCE  10 MG TABS tablet TAKE 1 TABLET BY MOUTH BEFORE BREAKFAST DAILY   ketorolac  (ACULAR ) 0.5 % ophthalmic solution Place 1 drop into the left eye 4 (four) times daily.   meclizine  (ANTIVERT ) 12.5 MG tablet Take 1 tablet (12.5 mg total) by mouth 3 (three) times daily as needed for dizziness.   metoprolol  succinate (TOPROL -XL) 50 MG 24  hr tablet TAKE 1 & 1/2 TABLETS BY MOUTH EVERY MORNING AND TAKE 1& 1/2 TABLET BY MOUTH EVERY DAY AT BEDTIME   Multiple Vitamin (MULTIVITAMIN) tablet Take 1 tablet by mouth daily. BariatricPal Multivitamin   olmesartan  (BENICAR ) 20 MG tablet Take 1 tablet (20 mg total) by mouth daily.   pantoprazole  (PROTONIX ) 40 MG tablet TAKE 1 TABLET BY MOUTH ONCE DAILY   potassium chloride  (KLOR-CON  M) 10 MEQ tablet TAKE 1 TABLET  BY MOUTH ONCE DAILY   prednisoLONE acetate (PRED FORTE) 1 % ophthalmic suspension Place 1 drop into the left eye 3 (three) times daily.   Semaglutide , 2 MG/DOSE, (OZEMPIC , 2 MG/DOSE,) 8 MG/3ML SOPN INJECT 2MG  SUBCUTANEOUSLY ONCE WEEKLY   spironolactone  (ALDACTONE ) 25 MG tablet TAKE 1/2 TABLET BY MOUTH ONCE DAILY   triamcinolone  (NASACORT ) 55 MCG/ACT AERO nasal inhaler Place 2 sprays into the nose daily.   triamcinolone  cream (KENALOG ) 0.1 % Apply 1 Application topically 2 (two) times daily. (Patient taking differently: Apply 1 Application topically 2 (two) times daily as needed.)   venlafaxine  XR (EFFEXOR -XR) 75 MG 24 hr capsule Take 1 capsule (75 mg total) by mouth every morning.   Vitamin D , Ergocalciferol , (DRISDOL ) 1.25 MG (50000 UNIT) CAPS capsule Take 1 capsule (50,000 Units total) by mouth every 14 (fourteen) days.   No facility-administered encounter medications on file as of 08/12/2023.    Allergies (verified) Hydrocodone -acetaminophen , Ace inhibitors, and Hctz [hydrochlorothiazide ]   History: Past Medical History:  Diagnosis Date   Anxiety    Arthritis    Back pain    BPPV (benign paroxysmal positional vertigo)    CHF (congestive heart failure) (HCC)    Chronic diastolic HF (heart failure) (HCC)    Complication of anesthesia    Post-operative hypoxia requiring supplemental oxygen   Current use of long term anticoagulation    Apixaban    CVA (cerebral vascular accident) (HCC) 02/28/2020   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; imaging from [facility]   Depression    Edema of both lower extremities    GERD (gastroesophageal reflux disease)    Hypertension    IBS (irritable bowel syndrome)    Lactose intolerance    Morbid obesity with BMI of 45.0-49.9, adult (HCC)    Obesity    Osteoarthritis    PAF (paroxysmal atrial fibrillation) (HCC)    Pre-diabetes    Sleep apnea    uses CPAP machine sometimes    Type 2 diabetes mellitus without complication (HCC)    Vitamin D   deficiency    Past Surgical History:  Procedure Laterality Date   ABDOMINAL HYSTERECTOMY     ATRIAL FIBRILLATION ABLATION N/A 05/29/2022   Procedure: ATRIAL FIBRILLATION ABLATION;  Surgeon: Boyce Byes, MD;  Location: [facility];  Service: Cardiovascular;  Laterality: N/A;   BIOPSY  05/21/2023   Procedure: BIOPSY;  Surgeon: Marnee Sink, MD;  Location: [facility];  Service: Endoscopy;;   BREAST EXCISIONAL BIOPSY Left    CARDIOVERSION N/A 03/05/2022   Procedure: CARDIOVERSION;  Surgeon: Constancia Delton, MD;  Location: [facility];  Service: Cardiovascular;  Laterality: N/A;   COLONOSCOPY WITH PROPOFOL  N/A 05/21/2023   Procedure: COLONOSCOPY WITH PROPOFOL ;  Surgeon: Marnee Sink, MD;  Location: [facility];  Service: Endoscopy;  Laterality: N/A;   ENDOMETRIAL ABLATION     x2   ENDOSCOPIC CONCHA BULLOSA RESECTION Bilateral 08/08/2020   Procedure: ENDOSCOPIC CONCHA BULLOSA RESECTION;  Surgeon: Mellody Sprout, MD;  Location: [facility];  Service: ENT;  Laterality: Bilateral;   ETHMOIDECTOMY  Right 08/08/2020   Procedure: ETHMOIDECTOMY, LEFT PARTIAL ETHMOIDECTOMY;  Surgeon: Mellody Sprout, MD;  Location: [facility];  Service: ENT;  Laterality: Right;   IMAGE GUIDED SINUS SURGERY N/A 08/08/2020   Procedure: IMAGE GUIDED SINUS SURGERY, RIGHT FRONTAL SINUSOTOMY;  Surgeon: Mellody Sprout, MD;  Location: [facility];  Service: ENT;  Laterality: N/A;   LAPAROSCOPIC SLEEVE GASTRECTOMY     MAXILLARY ANTROSTOMY Bilateral 08/08/2020   Procedure: MAXILLARY ANTROSTOMY with tissue;  Surgeon: Mellody Sprout, MD;  Location: [facility];  Service: ENT;  Laterality: Bilateral;   NASAL TURBINATE REDUCTION  08/27/2020   Procedure: TURBINATE REDUCTION /SUBMUCOSAL DEBRIDEMENT;  Surgeon: Mellody Sprout, MD;  Location: [facility];  Service: ENT;;   TOTAL KNEE ARTHROPLASTY Right 06/2019   TOTAL KNEE ARTHROPLASTY Left 01/31/2021   [facility]   Family History  Problem Relation Age of Onset    Diabetes Mother    High blood pressure Mother    Heart disease Father    Stroke Sister    Cancer Brother    Mental illness Neg Hx    Social History   Socioeconomic History   Marital status: Divorced    Spouse name: Not on file   Number of children: 2   Years of education: Not on file   Highest education level: Associate degree: occupational, Scientist, product/process development, or vocational program  Occupational History   Occupation: Retired/ work PT    Employer: [employer]  Tobacco Use   Smoking status: Never   Smokeless tobacco: Never  Vaping Use   Vaping status: Never Used  Substance and Sexual Activity   Alcohol use: No    Alcohol/week: 0.0 standard drinks of alcohol    Comment: rare   Drug use: No   Sexual activity: Not on file  Other Topics Concern   Not on file  Social History Narrative   Not on file   Social Drivers of Health   Financial Resource Strain: Low Risk  (08/12/2023)   Overall Financial Resource Strain (CARDIA)    Difficulty of Paying Living Expenses: Not hard at all  Food Insecurity: No Food Insecurity (08/12/2023)   Hunger Vital Sign    Worried About Running Out of Food in the Last Year: Never true    Ran Out of Food in the Last Year: Never true  Transportation Needs: No Transportation Needs (08/12/2023)   PRAPARE - Administrator, Civil Service (Medical): No    Lack of Transportation (Non-Medical): No  Physical Activity: Sufficiently Active (08/12/2023)   Exercise Vital Sign    Days of Exercise per Week: 3 days    Minutes of Exercise per Session: 60 min  Stress: No Stress Concern Present (08/12/2023)   [employer] of Occupational Health - Occupational Stress Questionnaire    Feeling of Stress : Not at all  Social Connections: Moderately Integrated (08/12/2023)   Social Connection and Isolation Panel [NHANES]    Frequency of Communication with Friends and Family: More than three times a week    Frequency of Social Gatherings with Friends and Family: Three times  a week    Attends Religious Services: More than 4 times per year    Active Member of Clubs or Organizations: Yes    Attends Engineer, structural: More than 4 times per year    Marital Status: Divorced    Tobacco Counseling Counseling given: Not Answered    Clinical Intake:  Pre-visit preparation completed: Yes  Pain : No/denies pain Pain Score: 0-No pain  BMI - recorded: 36.49 Nutritional Status: BMI > 30  Obese Nutritional Risks: None Diabetes: Yes (PREDIABETES) CBG done?: No Did pt. bring in CBG monitor from home?: No  Lab Results  Component Value Date   HGBA1C 5.8 (H) 06/22/2023   HGBA1C 6.0 (H) 02/23/2023   HGBA1C 5.4 10/29/2022     How often do you need to have someone help you when you read instructions, pamphlets, or other written materials from your doctor or pharmacy?: 1 - Never  Interpreter Needed?: No  Information entered by :: Arvis Miguez N. Tyne Banta, LPN.   Activities of Daily Living     08/12/2023    8:41 AM 03/23/2023    3:37 PM  In your present state of health, do you have any difficulty performing the following activities:  Hearing? 0 0  Vision? 0 1  Difficulty concentrating or making decisions? 0 0  Walking or climbing stairs? 0 0  Dressing or bathing? 0 0  Doing errands, shopping? 0 0  Preparing Food and eating ? N   Using the Toilet? N   In the past six months, have you accidently leaked urine? Y   Comment wears a panty liner   Do you have problems with loss of bowel control? N   Managing your Medications? N   Managing your Finances? N   Housekeeping or managing your Housekeeping? N     Patient Care Team: Sowles, Krichna, MD as PCP - General (Family Medicine) Boyce Byes, MD as PCP - Electrophysiology (Cardiology) Constancia Delton, MD as PCP - Cardiology (Cardiology) Jerlyn Moons, MD as Consulting Physician (Orthopedic Surgery) Constancia Delton, MD as Consulting Physician (Cardiology) Alla Isaacs, Severa Daniels,  RPH-CPP as Pharmacist Charol Copas, [place] (Optometry) Cleve Dale, MD as Consulting Physician (Pulmonary Disease) [facility] as Consulting Physician (Optometry)  Indicate any recent Medical Services you may have received from other than [facility] providers in the past year (date may be approximate).     Assessment:   This is a routine wellness examination for [place].  Hearing/Vision screen Hearing Screening - Comments:: Denies hearing difficulties.   Vision Screening - Comments:: Wears reading glasses - up to date with routine eye exams with Dr. Alto Atta Tyrone Gallop, [place])    Goals Addressed             This Visit's Progress    Patient Stated       08/12/2023: My goal is to lose another 20 pounds.       Depression Screen     08/12/2023    8:44 AM 07/06/2023    8:18 AM 05/06/2023    3:09 PM 03/23/2023    3:38 PM 03/03/2023    8:05 AM 10/29/2022    8:48 AM 10/28/2022    8:26 AM  PHQ 2/9 Scores  PHQ - 2 Score 0 0 0 0 0 0 0  PHQ- 9 Score 0 0 0 0 0 0     Fall Risk     08/12/2023    8:40 AM 07/06/2023    8:12 AM 05/06/2023    3:09 PM 03/23/2023    3:37 PM 03/03/2023    8:05 AM  Fall Risk   Falls in the past year? 0 0 0 0 0  Number falls in past yr: 0 0 0 0 0  Injury with Fall? 0 0 0 0 0  Risk for fall due to : No Fall Risks No Fall Risks No Fall Risks No Fall Risks No Fall Risks  Follow up  Falls prevention discussed;Falls evaluation completed Falls prevention discussed;Education provided;Falls evaluation completed Falls prevention discussed;Education provided;Falls evaluation completed Falls prevention discussed;Education provided;Falls evaluation completed Falls prevention discussed    MEDICARE RISK AT HOME:  Medicare Risk at Home Any stairs in or around the home?: No If so, are there any without handrails?: No Home free of loose throw rugs in walkways, pet beds, electrical cords, etc?: Yes Adequate lighting in your home to reduce risk of falls?: Yes Life alert?: No Use  of a cane, walker or w/c?: No Grab bars in the bathroom?: No Shower chair or bench in shower?: Yes (does not use it) Elevated toilet seat or a handicapped toilet?: No  TIMED UP AND GO:  Was the test performed?  No  Cognitive Function: Patient has current diagnosis of cognitive impairment. Patient is followed by neurology for ongoing assessment. Patient is unable to complete screening 6CIT or MMSE.      08/12/2023    8:47 AM  MMSE - Mini Mental State Exam  Not completed: Unable to complete        10/28/2022    8:27 AM  6CIT Screen  What Year? 0 points  What month? 0 points  What time? 0 points  Count back from 20 0 points  Months in reverse 0 points  Repeat phrase 0 points  Total Score 0 points    Immunizations Immunization History  Administered Date(s) Administered   Fluad Quad(high Dose 65+) 01/16/2022   Fluad Trivalent(High Dose 65+) 03/03/2023   Influenza, Quadrivalent, Recombinant, Inj, Pf 01/19/2018   Influenza,inj,Quad PF,6+ Mos 01/11/2016, 12/24/2018, 03/05/2020, 03/13/2021   Influenza-Unspecified 01/30/2017, 01/19/2018   PFIZER(Purple Top)SARS-COV-2 Vaccination 07/17/2019, 08/10/2019, 03/17/2020   PNEUMOCOCCAL CONJUGATE-20 09/10/2021   Pneumococcal Conjugate-13 04/05/2015   Tdap 03/13/2021   Zoster Recombinant(Shingrix ) 03/12/2018, 08/17/2018    Screening Tests Health Maintenance  Topic Date Due   COVID-19 Vaccine (4 - 2024-25 season) 12/21/2022   FOOT EXAM  10/29/2023   INFLUENZA VACCINE  11/20/2023   HEMOGLOBIN A1C  12/23/2023   Diabetic kidney evaluation - Urine ACR  03/02/2024   Diabetic kidney evaluation - eGFR measurement  06/21/2024   OPHTHALMOLOGY EXAM  07/07/2024   Medicare Annual Wellness (AWV)  08/11/2024   MAMMOGRAM  07/13/2025   Colonoscopy  05/20/2030   DTaP/Tdap/Td (2 - Td or Tdap) 03/14/2031   Pneumonia Vaccine 26+ Years old  Completed   DEXA SCAN  Completed   Hepatitis C Screening  Completed   Zoster Vaccines- Shingrix   Completed    HPV VACCINES  Aged Out   Meningococcal B Vaccine  Aged Out    Health Maintenance  Health Maintenance Due  Topic Date Due   COVID-19 Vaccine (4 - 2024-25 season) 12/21/2022   Health Maintenance Items Addressed: Yes  Additional Screening:  Vision Screening: Recommended annual ophthalmology exams for early detection of glaucoma and other disorders of the eye.  Dental Screening: Recommended annual dental exams for proper oral hygiene  Community Resource Referral / Chronic Care Management: CRR required this visit?  No   CCM required this visit?  No     Plan:     I have personally reviewed and noted the following in the patient's chart:   Medical and social history Use of alcohol, tobacco or illicit drugs  Current medications and supplements including opioid prescriptions. Patient is not currently taking opioid prescriptions. Functional ability and status Nutritional status Physical activity Advanced directives List of other physicians Hospitalizations, surgeries, and ER visits in previous 12 months Vitals Screenings  to include cognitive, depression, and falls Referrals and appointments  In addition, I have reviewed and discussed with patient certain preventive protocols, quality metrics, and best practice recommendations. A written personalized care plan for preventive services as well as general preventive health recommendations were provided to patient.     Margette Sheldon, LPN   1/61/0960   After Visit Summary: (MyChart) Due to this being a telephonic visit, the after visit summary with patients personalized plan was offered to patient via MyChart   Notes: Nothing significant to report at this time.

## 2023-08-21 ENCOUNTER — Encounter: Payer: Self-pay | Admitting: Cardiology

## 2023-08-21 ENCOUNTER — Ambulatory Visit: Payer: Medicare (Managed Care) | Attending: Cardiology | Admitting: Cardiology

## 2023-08-21 ENCOUNTER — Ambulatory Visit: Payer: Medicare (Managed Care) | Attending: Otolaryngology

## 2023-08-21 VITALS — BP 138/76 | HR 71 | Ht 68.0 in | Wt 248.6 lb

## 2023-08-21 DIAGNOSIS — I5020 Unspecified systolic (congestive) heart failure: Secondary | ICD-10-CM

## 2023-08-21 DIAGNOSIS — I480 Paroxysmal atrial fibrillation: Secondary | ICD-10-CM

## 2023-08-21 DIAGNOSIS — G4733 Obstructive sleep apnea (adult) (pediatric): Secondary | ICD-10-CM | POA: Diagnosis present

## 2023-08-21 DIAGNOSIS — G4761 Periodic limb movement disorder: Secondary | ICD-10-CM | POA: Insufficient documentation

## 2023-08-21 DIAGNOSIS — Z6837 Body mass index (BMI) 37.0-37.9, adult: Secondary | ICD-10-CM

## 2023-08-21 DIAGNOSIS — I10 Essential (primary) hypertension: Secondary | ICD-10-CM

## 2023-08-21 DIAGNOSIS — G4727 Circadian rhythm sleep disorder in conditions classified elsewhere: Secondary | ICD-10-CM | POA: Diagnosis not present

## 2023-08-21 DIAGNOSIS — I493 Ventricular premature depolarization: Secondary | ICD-10-CM | POA: Diagnosis not present

## 2023-08-21 NOTE — Patient Instructions (Signed)
 Medication Instructions:   Your Physician recommend you continue on your current medication as directed.     *If you need a refill on your cardiac medications before your next appointment, please call your pharmacy*  Lab Work:  No labs ordered today.   If you have labs (blood work) drawn today and your tests are completely normal, you will receive your results only by: MyChart Message (if you have MyChart) OR A paper copy in the mail If you have any lab test that is abnormal or we need to change your treatment, we will call you to review the results.  Testing/Procedures:  No test ordered today.   Follow-Up: At [facility], you and your health needs are our priority.  As part of our continuing mission to provide you with exceptional heart care, our providers are all part of one team.  This team includes your primary Cardiologist (physician) and Advanced Practice Providers or APPs (Physician Assistants and Nurse Practitioners) who all work together to provide you with the care you need, when you need it.  Your next appointment:   1 year(s)  Provider:   You may see Debbe Odea, MD or one of the following Advanced Practice Providers on your designated Care Team:   Nicolasa Ducking, NP Ames Dura, PA-C Eula Listen, PA-C Cadence Riceboro, PA-C Charlsie Quest, NP Carlos Levering, NP    We recommend signing up for the patient portal called "MyChart".  Sign up information is provided on this After Visit Summary.  MyChart is used to connect with patients for Virtual Visits (Telemedicine).  Patients are able to view lab/test results, encounter notes, upcoming appointments, etc.  Non-urgent messages can be sent to your provider as well.   To learn more about what you can do with MyChart, go to [email or web address].

## 2023-08-21 NOTE — Progress Notes (Signed)
 Cardiology Office Note:    Date:  08/21/2023   ID:  Regina Christensen, DOB 09-25-1955, MRN [id number]  PCP:  Sowles, Krichna, MD  Cardiologist:  Constancia Delton, MD  Electrophysiologist:  Boyce Byes, MD   Referring MD: Arleen Lacer, MD   Chief Complaint  Patient presents with   Follow-up    6 month follow up visit. Patient is doing well on today. Meds reviewed.     History of Present Illness:    Regina Christensen is a 68 y.o. female with a hx of hypertension, paroxysmal A. fib s/p ablation 05/2022, CVA 02/2020 (2/2 not taking eliquis ), HFrEF, normalized EF, obesity, sleep apnea who presents for follow-up.  Doing okay from a cardiac perspective, denies palpitations.  Tolerating Eliquis  with no bleeding issues.  Blood pressure is also adequately controlled.  Endorses being sedentary over the past several months leading to some weight gain.  Plans to be more active and also eat healthier to help with weight loss.  Has no concerns today.  Prior notes Echo 02/2022 EF 40 to 45% Echocardiogram 02/2020 EF 55 to 60%, mildly dilated left atrium Did not tolerate Entresto  in the past, cause palpitations.  Past Medical History:  Diagnosis Date   Anxiety    Arthritis    Back pain    BPPV (benign paroxysmal positional vertigo)    CHF (congestive heart failure) (HCC)    Chronic diastolic HF (heart failure) (HCC)    Complication of anesthesia    Post-operative hypoxia requiring supplemental oxygen   Current use of long term anticoagulation    Apixaban    CVA (cerebral vascular accident) (HCC) 02/28/2020   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; imaging from [facility]   Depression    Edema of both lower extremities    GERD (gastroesophageal reflux disease)    Hypertension    IBS (irritable bowel syndrome)    Lactose intolerance    Morbid obesity with BMI of 45.0-49.9, adult (HCC)    Obesity    Osteoarthritis    PAF (paroxysmal atrial fibrillation) (HCC)    Pre-diabetes     Sleep apnea    uses CPAP machine sometimes    Type 2 diabetes mellitus without complication (HCC)    Vitamin D  deficiency     Past Surgical History:  Procedure Laterality Date   ABDOMINAL HYSTERECTOMY     ATRIAL FIBRILLATION ABLATION N/A 05/29/2022   Procedure: ATRIAL FIBRILLATION ABLATION;  Surgeon: Boyce Byes, MD;  [facility];  Service: Cardiovascular;  Laterality: N/A;   BIOPSY  05/21/2023   Procedure: BIOPSY;  Surgeon: Marnee Sink, MD;  Location: [facility];  Service: Endoscopy;;   BREAST EXCISIONAL BIOPSY Left    CARDIOVERSION N/A 03/05/2022   Procedure: CARDIOVERSION;  Surgeon: Constancia Delton, MD;  Location: [facility];  Service: Cardiovascular;  Laterality: N/A;   COLONOSCOPY WITH PROPOFOL  N/A 05/21/2023   Procedure: COLONOSCOPY WITH PROPOFOL ;  Surgeon: Marnee Sink, MD;  Location: [facility];  Service: Endoscopy;  Laterality: N/A;   ENDOMETRIAL ABLATION     x2   ENDOSCOPIC CONCHA BULLOSA RESECTION Bilateral 08/08/2020   Procedure: ENDOSCOPIC CONCHA BULLOSA RESECTION;  Surgeon: Mellody Sprout, MD;  Location: [facility];  Service: ENT;  Laterality: Bilateral;   ETHMOIDECTOMY Right 08/08/2020   Procedure: ETHMOIDECTOMY, LEFT PARTIAL ETHMOIDECTOMY;  Surgeon: Mellody Sprout, MD;  Location: [facility];  Service: ENT;  Laterality: Right;   IMAGE GUIDED SINUS SURGERY N/A 08/08/2020   Procedure: IMAGE GUIDED SINUS SURGERY, RIGHT FRONTAL  SINUSOTOMY;  Surgeon: Mellody Sprout, MD;  Location: [facility];  Service: ENT;  Laterality: N/A;   LAPAROSCOPIC SLEEVE GASTRECTOMY     MAXILLARY ANTROSTOMY Bilateral 08/08/2020   Procedure: MAXILLARY ANTROSTOMY with tissue;  Surgeon: Mellody Sprout, MD;  Location: [facility];  Service: ENT;  Laterality: Bilateral;   NASAL TURBINATE REDUCTION  08/27/2020   Procedure: TURBINATE REDUCTION /SUBMUCOSAL DEBRIDEMENT;  Surgeon: Juengel, Paul, MD;  Location: [facility];  Service: ENT;;   TOTAL KNEE ARTHROPLASTY Right 06/2019   T[facility]    Current Medications: Current Meds  Medication Sig   apixaban  (ELIQUIS ) 5 MG TABS tablet Take 1 tablet (5 mg total) by mouth 2 (two) times daily.   atorvastatin  (LIPITOR) 10 MG tablet TAKE 1 TABLET BY MOUTH ONCE DAILY   Blood Pressure KIT 1 Units by Does not apply route daily.   famotidine  (PEPCID ) 20 MG tablet TAKE 1 TABLET BY MOUTH DAILY AS NEEDED FOR INDIGESTION   fexofenadine (ALLEGRA) 180 MG tablet Take 180 mg by mouth daily as needed.   furosemide  (LASIX ) 40 MG tablet TAKE 1 TABLET BY MOUTH DAILY *REFILL REQUEST*   gabapentin  (NEURONTIN ) 300 MG capsule Take 300 mg by mouth 3 (three) times daily as needed (for pain).   JARDIANCE  10 MG TABS tablet TAKE 1 TABLET BY MOUTH BEFORE BREAKFAST DAILY   meclizine  (ANTIVERT ) 12.5 MG tablet Take 1 tablet (12.5 mg total) by mouth 3 (three) times daily as needed for dizziness.   metoprolol  succinate (TOPROL -XL) 50 MG 24 hr tablet TAKE 1 & 1/2 TABLETS BY MOUTH EVERY MORNING AND TAKE 1& 1/2 TABLET BY MOUTH EVERY DAY AT BEDTIME   Multiple Vitamin (MULTIVITAMIN) tablet Take 1 tablet by mouth daily. BariatricPal Multivitamin   olmesartan  (BENICAR ) 20 MG tablet Take 1 tablet (20 mg total) by mouth daily.   pantoprazole  (PROTONIX ) 40 MG tablet TAKE 1 TABLET BY MOUTH ONCE DAILY   potassium chloride  (KLOR-CON  M) 10 MEQ tablet TAKE 1 TABLET BY MOUTH ONCE DAILY   Semaglutide , 2 MG/DOSE, (OZEMPIC , 2 MG/DOSE,) 8 MG/3ML SOPN INJECT 2MG  SUBCUTANEOUSLY ONCE WEEKLY   spironolactone  (ALDACTONE ) 25 MG tablet TAKE 1/2 TABLET BY MOUTH ONCE DAILY   triamcinolone  (NASACORT ) 55 MCG/ACT AERO nasal inhaler Place 2 sprays into the nose daily.   triamcinolone  cream (KENALOG ) 0.1 % Apply 1 Application topically 2 (two) times daily. (Patient taking differently: Apply 1 Application topically 2 (two) times daily as needed.)   venlafaxine  XR (EFFEXOR -XR) 75 MG 24 hr capsule Take 1 capsule (75 mg total) by mouth every  morning.   Vitamin D , Ergocalciferol , (DRISDOL ) 1.25 MG (50000 UNIT) CAPS capsule Take 1 capsule (50,000 Units total) by mouth every 14 (fourteen) days.   XIIDRA  5 % SOLN Apply 1 drop to eye 2 (two) times daily.     Allergies:   Hydrocodone -acetaminophen , Ace inhibitors, and Hctz [hydrochlorothiazide ]   Social History   Socioeconomic History   Marital status: Divorced    Spouse name: Not on file   Number of children: 2   Years of education: Not on file   Highest education level: Associate degree: occupational, Scientist, product/process development, or vocational program  Occupational History   Occupation: Retired/ work PT    Employer: [employer]  Tobacco Use   Smoking status: Never   Smokeless tobacco: Never  Vaping Use   Vaping status: Never Used  Substance and Sexual Activity   Alcohol use: No    Alcohol/week: 0.0 standard drinks of alcohol  Comment: rare   Drug use: No   Sexual activity: Not on file  Other Topics Concern   Not on file  Social History Narrative   Not on file   Social Drivers of Health   Financial Resource Strain: Low Risk  (08/12/2023)   Overall Financial Resource Strain (CARDIA)    Difficulty of Paying Living Expenses: Not hard at all  Food Insecurity: No Food Insecurity (08/12/2023)   Hunger Vital Sign    Worried About Running Out of Food in the Last Year: Never true    Ran Out of Food in the Last Year: Never true  Transportation Needs: No Transportation Needs (08/12/2023)   PRAPARE - Administrator, Civil Service (Medical): No    Lack of Transportation (Non-Medical): No  Physical Activity: Sufficiently Active (08/12/2023)   Exercise Vital Sign    Days of Exercise per Week: 3 days    Minutes of Exercise per Session: 60 min  Stress: No Stress Concern Present (08/12/2023)   [employer] of Occupational Health - Occupational Stress Questionnaire    Feeling of Stress : Not at all  Social Connections: Moderately Integrated (08/12/2023)   Social Connection and Isolation  Panel [NHANES]    Frequency of Communication with Friends and Family: More than three times a week    Frequency of Social Gatherings with Friends and Family: Three times a week    Attends Religious Services: More than 4 times per year    Active Member of Clubs or Organizations: Yes    Attends Engineer, structural: More than 4 times per year    Marital Status: Divorced     Family History: The patient's family history includes Cancer in her brother; Diabetes in her mother; Heart disease in her father; High blood pressure in her mother; Stroke in her sister. There is no history of Mental illness.  Her sister has atrial fibrillation, multiple nephews have atrial fibrillation.  ROS:   Please see the history of present illness.     All other systems reviewed and are negative.  EKGs/Labs/Other Studies Reviewed:     Recent Labs: 10/03/2022: B Natriuretic Peptide 36.0 06/22/2023: ALT 15; BUN 12; Creatinine, Ser 0.77; Potassium 4.2; Sodium 141  Recent Lipid Panel    Component Value Date/Time   CHOL 124 03/03/2023 0900   CHOL 151 12/05/2014 1117   TRIG 57 03/03/2023 0900   HDL 77 03/03/2023 0900   HDL 74 12/05/2014 1117   CHOLHDL 1.6 03/03/2023 0900   VLDL 14 03/01/2020 0306   LDLCALC 34 03/03/2023 0900    Physical Exam:    VS:  BP 138/76   Pulse 71   Ht 5\' 8"  (1.727 m)   Wt 248 lb 9.6 oz (112.8 kg)   SpO2 98%   BMI 37.80 kg/m     Wt Readings from Last 3 Encounters:  08/21/23 248 lb 9.6 oz (112.8 kg)  08/12/23 240 lb (108.9 kg)  07/23/23 245 lb (111.1 kg)     GEN:  Well nourished, well developed in no acute distress HEENT: Normal NECK: No JVD; No carotid bruits CARDIAC: Regular rate and rhythm. RESPIRATORY:  Clear to auscultation without rales, wheezing or rhonchi  ABDOMEN: Soft, non-tender, distended MUSCULOSKELETAL:  trace edema; No deformity  SKIN: Warm and dry NEUROLOGIC:  Alert and oriented x 3 PSYCHIATRIC:  Normal affect   ASSESSMENT:    1. Paroxysmal  A-fib (HCC)   2. Primary hypertension   3. HFrEF (heart failure with reduced ejection  fraction) (HCC)   4. BMI 37.0-37.9, adult    PLAN:    Paroxysmal atrial fibrillation s/p ablation 05/2022.  Maintaining sinus rhythm.  Continue Toprol -XL 75 mg daily, Eliquis  5 mg twice daily. Hypertension, BP elevated, usually controlled.  Continue Toprol -XL 75 mg daily, Benicar  20 mg daily, Aldactone  12.5 mg daily. Mild to moderately reduced initial EF, 40 to 45%.  Normalized on last echo 10/30/2022 EF 60 to 65%.  Etiology likely tachycardia induced.  Appears euvolemic.  Toprol -XL, Benicar , Aldactone  as above for BP control. Obesity, continue low-calorie diet, weight loss.  On Ozempic .  Follow-up in 12 months.  Medication Adjustments/Labs and Tests Ordered: Current medicines are reviewed at length with the patient today.  Concerns regarding medicines are outlined above.  No orders of the defined types were placed in this encounter.   No orders of the defined types were placed in this encounter.    Patient Instructions  Medication Instructions:  Your Physician recommend you continue on your current medication as directed.    *If you need a refill on your cardiac medications before your next appointment, please call your pharmacy*  Lab Work: No labs ordered today  If you have labs (blood work) drawn today and your tests are completely normal, you will receive your results only by: MyChart Message (if you have MyChart) OR A paper copy in the mail If you have any lab test that is abnormal or we need to change your treatment, we will call you to review the results.  Testing/Procedures: No test ordered today   Follow-Up: At [facility], you and your health needs are our priority.  As part of our continuing mission to provide you with exceptional heart care, our providers are all part of one team.  This team includes your primary Cardiologist (physician) and Advanced Practice Providers or  APPs (Physician Assistants and Nurse Practitioners) who all work together to provide you with the care you need, when you need it.  Your next appointment:   1 year(s)  Provider:   You may see Constancia Delton, MD or one of the following Advanced Practice Providers on your designated Care Team:   Laneta Pintos, NP Gildardo Labrador, PA-C Varney Gentleman, PA-C Cadence Forada, PA-C Ronald Cockayne, NP Morey Ar, NP    We recommend signing up for the patient portal called "MyChart".  Sign up information is provided on this After Visit Summary.  MyChart is used to connect with patients for Virtual Visits (Telemedicine).  Patients are able to view lab/test results, encounter notes, upcoming appointments, etc.  Non-urgent messages can be sent to your provider as well.   To learn more about what you can do with MyChart, go to [email or web address].    Signed, Constancia Delton, MD  08/21/2023 9:16 AM    [facility]

## 2023-08-25 ENCOUNTER — Encounter: Payer: Self-pay | Admitting: Urology

## 2023-08-25 ENCOUNTER — Ambulatory Visit: Payer: Medicare (Managed Care) | Admitting: Urology

## 2023-08-25 VITALS — BP 165/94 | HR 71 | Ht 68.0 in | Wt 245.0 lb

## 2023-08-25 DIAGNOSIS — N3941 Urge incontinence: Secondary | ICD-10-CM

## 2023-08-25 DIAGNOSIS — N200 Calculus of kidney: Secondary | ICD-10-CM

## 2023-08-25 DIAGNOSIS — R319 Hematuria, unspecified: Secondary | ICD-10-CM | POA: Diagnosis not present

## 2023-08-25 LAB — URINALYSIS, COMPLETE
Bilirubin, UA: NEGATIVE
Ketones, UA: NEGATIVE
Leukocytes,UA: NEGATIVE
Nitrite, UA: NEGATIVE
Protein,UA: NEGATIVE
Specific Gravity, UA: <1.005 — ABNORMAL LOW (ref 1.005–1.030)
Urobilinogen, Ur: 0.2 mg/dL (ref 0.2–1.0)
pH, UA: 6 (ref 5.0–7.5)

## 2023-08-25 LAB — MICROSCOPIC EXAMINATION: Epithelial Cells (non renal): >10 /HPF — AB (ref 0–10)

## 2023-08-25 NOTE — Progress Notes (Signed)
 08/25/2023 9:33 AM   Regina Christensen 14-Jul-1955 [id number]  Referring provider: Sowles, Krichna, MD [address] [place],  [place] [address]  Urological history: 1.  High risk hematuria -non -smoker -CTU (04/2022) - no GU malignancies -cysto (04/2022) - NED   2.  Nephrolithiasis -CTU (04/2022) - 3 mm non obstructing stone lower pole of right kidney  3. Renal cysts -CTU (04/2022) -simple cyst in left kidney with bilateral proteinaceous or hemorrhagic cysts in both kidneys  Chief Complaint  Patient presents with   Follow-up   HPI: Regina Christensen is a 68 y.o. female who presents today for UA follow up.  Previous records reviewed.     At her visit on July, 30th, 2024, she had findings of an abnormal appendiceal diameter and a perirectal nodule for which a follow-up CT of the pelvis was recommended in 3 months for surveillance.  She was also seen by her general surgeon and they are agreement.  The CT scan has not been completed as of this visit.  She is having 1-7 daytime urinations, nocturia x 1-2 with a mild urge to urinate.  She wears 1 panty liner daily.  She does engage in toilet mapping.  Patient denies any modifying or aggravating factors.  Patient denies any recent UTI's, gross hematuria, dysuria or suprapubic/flank pain.  Patient denies any fevers, chills, nausea or vomiting.   UA yellow slightly cloudy, 3+ glucose, specific gravity 1.015, 1+ heme, pH 7.0, trace protein, 0-5 WBCs, 11-30 RBCs, 0-10 epithelial cells and many bacteria.  Urine culture MUF.  PVR 76 mL   She was scheduled for a follow up in six months for repeat UA and encouraged her to reach out to her general surgeon.   She was seen by her general surgeon.    At her visit on 05/26/2023, she is having 1-7 daytime voids, nocturia x 1-2 with a mild urge to urinate.  She does have some urge incontinence.  She leaks 1-2 times a week.  She wears 1 depends daily.  She does limit her fluid intake.  She  does engage in toilet mapping.  Patient denies any modifying or aggravating factors.  Patient denies any recent UTI's, gross hematuria, dysuria or suprapubic/flank pain.  Patient denies any fevers, chills, nausea or vomiting.  UA yellow slightly cloudy, trace glucose, specific gravity less than 1.005, trace blood, pH 6.5, trace leukocyte, 11-30 WBCs, 3-10 RBCs, 0-10 epithelial cells and many bacteria.  We discussed a referral to nephrology, but she deferred.  She decided to return in 3 months for recheck on her UA.  She is having 1-7 daytime voids, 1-2 episodes of nocturia with a mild urge to urinate.  She has urge incontinence leaking 1-2 times a week.  She wears 1 panty liner daily.  She does limit fluid intake.  She does engage in toilet mapping.  Patient denies any modifying or aggravating factors.  Patient denies any recent UTI's, gross hematuria, dysuria or suprapubic/flank pain.  Patient denies any fevers, chills, nausea or vomiting.    Today's UA is yellow clear, specific gravity less than 1.005, pH 6.0, 3+ glucose, 1+ protein, 0-5 WBCs, 3-10 RBCs, greater than 10 epithelial cells and many bacteria.  PMH: Past Medical History:  Diagnosis Date   Anxiety    Arthritis    Back pain    BPPV (benign paroxysmal positional vertigo)    CHF (congestive heart failure) (HCC)    Chronic diastolic HF (heart failure) (HCC)  Complication of anesthesia    Post-operative hypoxia requiring supplemental oxygen   Current use of long term anticoagulation    Apixaban    CVA (cerebral vascular accident) (HCC) 02/28/2020   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; imaging from [facility]   Depression    Edema of both lower extremities    GERD (gastroesophageal reflux disease)    Hypertension    IBS (irritable bowel syndrome)    Lactose intolerance    Morbid obesity with BMI of 45.0-49.9, adult (HCC)    Obesity    Osteoarthritis    PAF (paroxysmal atrial fibrillation) (HCC)    Pre-diabetes    Sleep apnea     uses CPAP machine sometimes    Type 2 diabetes mellitus without complication (HCC)    Vitamin D  deficiency     Surgical History: Past Surgical History:  Procedure Laterality Date   ABDOMINAL HYSTERECTOMY     ATRIAL FIBRILLATION ABLATION N/A 05/29/2022   Procedure: ATRIAL FIBRILLATION ABLATION;  Surgeon: Boyce Byes, MD;  [facility];  Service: Cardiovascular;  Laterality: N/A;   BIOPSY  05/21/2023   Procedure: BIOPSY;  Surgeon: Marnee Sink, MD;  Location: [facility];  Service: Endoscopy;;   BREAST EXCISIONAL BIOPSY Left    CARDIOVERSION N/A 03/05/2022   Procedure: CARDIOVERSION;  Surgeon: Constancia Delton, MD;  Location: [facility];  Service: Cardiovascular;  Laterality: N/A;   COLONOSCOPY WITH PROPOFOL  N/A 05/21/2023   Procedure: COLONOSCOPY WITH PROPOFOL ;  Surgeon: Marnee Sink, MD;  Location: [facility];  Service: Endoscopy;  Laterality: N/A;   ENDOMETRIAL ABLATION     x2   ENDOSCOPIC CONCHA BULLOSA RESECTION Bilateral 08/08/2020   Procedure: ENDOSCOPIC CONCHA BULLOSA RESECTION;  Surgeon: Mellody Sprout, MD;  Location: [facility];  Service: ENT;  Laterality: Bilateral;   ETHMOIDECTOMY Right 08/08/2020   Procedure: ETHMOIDECTOMY, LEFT PARTIAL ETHMOIDECTOMY;  Surgeon: Mellody Sprout, MD;  Location: [facility];  Service: ENT;  Laterality: Right;   IMAGE GUIDED SINUS SURGERY N/A 08/08/2020   Procedure: IMAGE GUIDED SINUS SURGERY, RIGHT FRONTAL SINUSOTOMY;  Surgeon: Mellody Sprout, MD;  Location: [facility];  Service: ENT;  Laterality: N/A;   LAPAROSCOPIC SLEEVE GASTRECTOMY     MAXILLARY ANTROSTOMY Bilateral 08/08/2020   Procedure: MAXILLARY ANTROSTOMY with tissue;  Surgeon: Mellody Sprout, MD;  Location: [facility];  Service: ENT;  Laterality: Bilateral;   NASAL TURBINATE REDUCTION  08/27/2020   Procedure: TURBINATE REDUCTION /SUBMUCOSAL DEBRIDEMENT;  Surgeon: Juengel, Paul, MD;  Location: [facility];  Service: ENT;;   TOTAL KNEE ARTHROPLASTY Right 06/2019   TOTAL  KNEE ARTHROPLASTY Left 01/31/2021   [facility]    Home Medications:  Allergies as of 08/25/2023       Reactions   Hydrocodone -acetaminophen  Other (See Comments)   Extreme headache   Ace Inhibitors Cough   Hctz [hydrochlorothiazide ] Rash   face        Medication List        Accurate as of Aug 25, 2023  9:33 AM. If you have any questions, ask your nurse or doctor.          apixaban  5 MG Tabs tablet Commonly known as: Eliquis  Take 1 tablet (5 mg total) by mouth 2 (two) times daily.   atorvastatin  10 MG tablet Commonly known as: LIPITOR TAKE 1 TABLET BY MOUTH ONCE DAILY   Blood Pressure Kit 1 Units by Does not apply route daily.   famotidine  20 MG tablet Commonly known as: PEPCID  TAKE 1 TABLET BY MOUTH DAILY  AS NEEDED FOR INDIGESTION   fexofenadine 180 MG tablet Commonly known as: ALLEGRA Take 180 mg by mouth daily as needed.   furosemide  40 MG tablet Commonly known as: LASIX  TAKE 1 TABLET BY MOUTH DAILY *REFILL REQUEST*   gabapentin  300 MG capsule Commonly known as: NEURONTIN  Take 300 mg by mouth 3 (three) times daily as needed (for pain).   Jardiance  10 MG Tabs tablet Generic drug: empagliflozin  TAKE 1 TABLET BY MOUTH BEFORE BREAKFAST DAILY   ketorolac  0.5 % ophthalmic solution Commonly known as: ACULAR  Place 1 drop into the left eye 4 (four) times daily.   meclizine  12.5 MG tablet Commonly known as: ANTIVERT  Take 1 tablet (12.5 mg total) by mouth 3 (three) times daily as needed for dizziness.   metoprolol  succinate 50 MG 24 hr tablet Commonly known as: TOPROL -XL TAKE 1 & 1/2 TABLETS BY MOUTH EVERY MORNING AND TAKE 1& 1/2 TABLET BY MOUTH EVERY DAY AT BEDTIME   multivitamin tablet Take 1 tablet by mouth daily. BariatricPal Multivitamin   olmesartan  20 MG tablet Commonly known as: BENICAR  Take 1 tablet (20 mg total) by mouth daily.   Ozempic  (2 MG/DOSE) 8 MG/3ML Sopn Generic drug: Semaglutide  (2 MG/DOSE) INJECT 2MG   SUBCUTANEOUSLY ONCE WEEKLY   pantoprazole  40 MG tablet Commonly known as: PROTONIX  TAKE 1 TABLET BY MOUTH ONCE DAILY   potassium chloride  10 MEQ tablet Commonly known as: KLOR-CON  M TAKE 1 TABLET BY MOUTH ONCE DAILY   prednisoLONE acetate 1 % ophthalmic suspension Commonly known as: PRED FORTE Place 1 drop into the left eye 3 (three) times daily.   spironolactone  25 MG tablet Commonly known as: ALDACTONE  TAKE 1/2 TABLET BY MOUTH ONCE DAILY   triamcinolone  55 MCG/ACT Aero nasal inhaler Commonly known as: NASACORT  Place 2 sprays into the nose daily.   triamcinolone  cream 0.1 % Commonly known as: KENALOG  Apply 1 Application topically 2 (two) times daily. What changed:  when to take this reasons to take this   venlafaxine  XR 75 MG 24 hr capsule Commonly known as: EFFEXOR -XR Take 1 capsule (75 mg total) by mouth every morning.   Vitamin D  (Ergocalciferol ) 1.25 MG (50000 UNIT) Caps capsule Commonly known as: DRISDOL  Take 1 capsule (50,000 Units total) by mouth every 14 (fourteen) days.   Xiidra  5 % Soln Generic drug: Lifitegrast  Apply 1 drop to eye 2 (two) times daily.        Allergies:  Allergies  Allergen Reactions   Hydrocodone -Acetaminophen  Other (See Comments)    Extreme headache   Ace Inhibitors Cough   Hctz [Hydrochlorothiazide ] Rash    face    Family History: Family History  Problem Relation Age of Onset   Diabetes Mother    High blood pressure Mother    Heart disease Father    Stroke Sister    Cancer Brother    Mental illness Neg Hx     Social History:  reports that she has never smoked. She has never used smokeless tobacco. She reports that she does not drink alcohol and does not use drugs.  ROS: Pertinent ROS in HPI  Physical Exam: BP (!) 165/94   Pulse 71   Ht 5\' 8"  (1.727 m)   Wt 245 lb (111.1 kg)   BMI 37.25 kg/m   Constitutional:  Well nourished. Alert and oriented, No acute distress. HEENT: [place] AT, moist mucus membranes.  Trachea  midline Cardiovascular: No clubbing, cyanosis, or edema. Respiratory: Normal respiratory effort, no increased work of breathing. Neurologic: Grossly intact, no focal deficits,  moving all 4 extremities. Psychiatric: Normal mood and affect.    Laboratory Data: Lab Results  Component Value Date   CREATININE 0.77 06/22/2023   Lab Results  Component Value Date   HGBA1C 5.8 (H) 06/22/2023      Component Value Date/Time   CHOL 124 03/03/2023 0900   CHOL 151 12/05/2014 1117   HDL 77 03/03/2023 0900   HDL 74 12/05/2014 1117   CHOLHDL 1.6 03/03/2023 0900   VLDL 14 03/01/2020 0306   LDLCALC 34 03/03/2023 0900   Lab Results  Component Value Date   AST 14 06/22/2023   Lab Results  Component Value Date   ALT 15 06/22/2023   Urinalysis See EPIC and HPI  I have reviewed the labs.   Pertinent Imaging: N/A  Assessment & Plan:    1.  High risk hematuria -Non-smoker -Workup in January 2024-no GU malignancies noted -No reports of gross hematuria -UA w/micro heme  -We discussed the persistent microscopic hematuria may be the result of her Eliquis , or a nephropathy or a small chance of GU malignancy though less likely with a negative workup last year.   We discussed the recent AUA guidelines concerning AMH and at this time there is no indication to repeat any studies -We will continue to monitor for any worsening of microscopic hematuria and she will let us  know if she experiences any gross hematuria  2. Nephrolithiasis -Asymptomatic  3. Urge incontinence -Not bothersome  Return in about 1 year (around 08/24/2024) for UA, OAB questionnaire and PVR.  These notes generated with voice recognition software. I apologize for typographical errors.  Briant Camper  [facility] [address]  [address] [place], [place] [address] [phone]

## 2023-09-04 ENCOUNTER — Other Ambulatory Visit: Payer: Self-pay

## 2023-09-04 DIAGNOSIS — I480 Paroxysmal atrial fibrillation: Secondary | ICD-10-CM

## 2023-09-04 MED ORDER — APIXABAN 5 MG PO TABS: 5.0000 mg | ORAL_TABLET | Freq: Two times a day (BID) | ORAL | 1 refills | Status: DC

## 2023-09-04 MED ORDER — OLMESARTAN MEDOXOMIL 20 MG PO TABS: 20.0000 mg | ORAL_TABLET | Freq: Every day | ORAL | 3 refills | Status: AC

## 2023-09-04 NOTE — Telephone Encounter (Signed)
 Prescription refill request for Eliquis  received. Indication: Afib  Last office visit: 08/21/23 ([facility]ang)  Scr: 0.77 (06/22/23)  Age: 68 Weight: 111.1kg  Appropriate dose. Refill sent.

## 2023-09-10 DIAGNOSIS — G4733 Obstructive sleep apnea (adult) (pediatric): Secondary | ICD-10-CM | POA: Diagnosis not present

## 2023-09-11 ENCOUNTER — Encounter: Payer: Self-pay | Admitting: Nurse Practitioner

## 2023-09-11 ENCOUNTER — Ambulatory Visit (INDEPENDENT_AMBULATORY_CARE_PROVIDER_SITE_OTHER): Payer: Medicare (Managed Care) | Admitting: Nurse Practitioner

## 2023-09-11 VITALS — BP 138/82 | HR 86 | Temp 98.4°F | Ht 68.0 in | Wt 246.2 lb

## 2023-09-11 DIAGNOSIS — J029 Acute pharyngitis, unspecified: Secondary | ICD-10-CM | POA: Diagnosis not present

## 2023-09-11 DIAGNOSIS — J0110 Acute frontal sinusitis, unspecified: Secondary | ICD-10-CM

## 2023-09-11 LAB — POC COVID19/FLU A&B COMBO
Covid Antigen, POC: NEGATIVE
Influenza A Antigen, POC: NEGATIVE
Influenza B Antigen, POC: NEGATIVE

## 2023-09-11 LAB — POCT RAPID STREP A (OFFICE): Rapid Strep A Screen: NEGATIVE

## 2023-09-11 MED ORDER — PREDNISONE 10 MG (21) PO TBPK: ORAL_TABLET | ORAL | 0 refills | Status: DC

## 2023-09-11 MED ORDER — AZITHROMYCIN 250 MG PO TABS: ORAL_TABLET | ORAL | 0 refills | Status: AC

## 2023-09-11 MED ORDER — BENZONATATE 100 MG PO CAPS: 200.0000 mg | ORAL_CAPSULE | Freq: Two times a day (BID) | ORAL | 0 refills | Status: DC | PRN

## 2023-09-11 MED ORDER — PROMETHAZINE-DM 6.25-15 MG/5ML PO SYRP: 5.0000 mL | ORAL_SOLUTION | Freq: Four times a day (QID) | ORAL | 0 refills | Status: DC | PRN

## 2023-09-11 NOTE — Progress Notes (Signed)
 BP 138/82 (BP Location: Left Arm, Patient Position: Sitting, Cuff Size: Normal)   Pulse 86   Temp 98.4 F (36.9 C) (Oral)   Ht 5\' 8"  (1.727 m)   Wt 246 lb 3.2 oz (111.7 kg)   SpO2 100%   BMI 37.43 kg/m    Subjective:    Patient ID: Regina Christensen, female    DOB: 1955/09/17, 68 y.o.   MRN: [id number]  HPI: Regina Christensen is a 68 y.o. female  Chief Complaint  Patient presents with   URI    Sore throat, hoarse, congested, onset tuesday    Discussed the use of AI scribe software for clinical note transcription with the patient, who gave verbal consent to proceed.  History of Present Illness Regina Christensen is a 68 year old female with a history of recurrent upper respiratory infections who presents with upper respiratory symptoms.  She has been experiencing upper respiratory symptoms since Tuesday, including a sore throat, nasal congestion, cough, ear pain, and body aches. No fever is present.  She has been using chloracetin for nasal itching, but it has not been effective. The ear pain is significant but without pressure.  She has a history of similar episodes over the years, often requiring treatment with prednisone  and antibiotics. She last received antibiotics in January for a similar condition.  Her symptoms often progress to a cough. 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         09/11/2023    9:05 AM 08/12/2023    8:44 AM 07/06/2023    8:18 AM  Depression screen PHQ 2/9  Decreased Interest 0 0 0  Down, Depressed, Hopeless 0 0 0  PHQ - 2 Score 0 0 0  Altered sleeping 0 0 0  Tired, decreased energy 0 0 0  Change in appetite 0 0 0  Feeling bad or failure about yourself  0 0 0  Trouble concentrating 0 0 0  Moving slowly or fidgety/restless 0 0 0  Suicidal thoughts 0 0 0  PHQ-9 Score 0 0 0  Difficult doing work/chores Not difficult at all Not difficult at all Not difficult at all    Relevant past  medical, surgical, family and social history reviewed and updated as indicated. Interim medical history since our last visit reviewed. Allergies and medications reviewed and updated.  Review of Systems  Ten systems reviewed and is negative except as mentioned in HPI      Objective:      BP 138/82 (BP Location: Left Arm, Patient Position: Sitting, Cuff Size: Normal)   Pulse 86   Temp 98.4 F (36.9 C) (Oral)   Ht 5\' 8"  (1.727 m)   Wt 246 lb 3.2 oz (111.7 kg)   SpO2 100%   BMI 37.43 kg/m    Wt Readings from Last 3 Encounters:  09/11/23 246 lb 3.2 oz (111.7 kg)  08/25/23 245 lb (111.1 kg)  08/21/23 248 lb 9.6 oz (112.8 kg)    Physical Exam Vitals reviewed.  Constitutional:      Appearance: Normal appearance.  HENT:     Head: Normocephalic.     Right Ear: A middle ear effusion is present.     Left Ear: Tympanic membrane normal.     Nose: Congestion present.     Right Sinus: No maxillary sinus tenderness or frontal sinus tenderness.     Left Sinus: No maxillary sinus tenderness or frontal sinus tenderness.  Mouth/Throat:     Mouth: Mucous membranes are moist.     Pharynx: Oropharynx is clear. No oropharyngeal exudate or posterior oropharyngeal erythema.  Eyes:     Extraocular Movements: Extraocular movements intact.     Conjunctiva/sclera: Conjunctivae normal.     Pupils: Pupils are equal, round, and reactive to light.  Cardiovascular:     Rate and Rhythm: Normal rate and regular rhythm.  Pulmonary:     Effort: Pulmonary effort is normal.     Breath sounds: Normal breath sounds.  Lymphadenopathy:     Cervical: Cervical adenopathy present.  Skin:    General: Skin is warm and dry.  Neurological:     General: No focal deficit present.     Mental Status: She is alert and oriented to person, place, and time. Mental status is at baseline.  Psychiatric:        Mood and Affect: Mood normal.        Behavior: Behavior normal.        Thought Content: Thought content  normal.        Judgment: Judgment normal.        Results for orders placed or performed in visit on 09/11/23  POCT rapid strep A   Collection Time: 09/11/23  9:16 AM  Result Value Ref Range   Rapid Strep A Screen Negative Negative  POC Covid19/Flu A&B Antigen   Collection Time: 09/11/23  9:17 AM  Result Value Ref Range   Influenza A Antigen, POC Negative Negative   Influenza B Antigen, POC Negative Negative   Covid Antigen, POC Negative Negative          Assessment & Plan:   Problem List Items Addressed This Visit   None Visit Diagnoses       Acute non-recurrent frontal sinusitis    -  Primary   Relevant Medications   predniSONE  (STERAPRED UNI-PAK 21 TAB) 10 MG (21) TBPK tablet   azithromycin  (ZITHROMAX ) 250 MG tablet   benzonatate  (TESSALON ) 100 MG capsule   promethazine -dextromethorphan  (PROMETHAZINE -DM) 6.25-15 MG/5ML syrup     Sore throat       Relevant Orders   POC Covid19/Flu A&B Antigen (Completed)   POCT rapid strep A (Completed)        Assessment and Plan Assessment & Plan Acute sinusitis Acute sinusitis with onset on Tuesday, presenting with sore throat, congestion, ear pain, and body aches. Examination shows a red, swollen throat and a bulging tympanic membrane, indicating sinus pressure. COVID-19, influenza, and streptococcal tests are negative. Similar episodes have been effectively treated with azithromycin  and prednisone  in the past. - Prescribe azithromycin  - Prescribe steroid taper - Advise hydration  Cough Cough related to acute sinusitis, with audible pulmonary congestion indicating mucus production. Cough is expected to worsen with increased sinus drainage. - Prescribe Tessalon  Perles for cough        Follow up plan: Return if symptoms worsen or fail to improve.

## 2023-09-16 ENCOUNTER — Ambulatory Visit (INDEPENDENT_AMBULATORY_CARE_PROVIDER_SITE_OTHER): Payer: Medicare (Managed Care) | Admitting: Sleep Medicine

## 2023-09-16 DIAGNOSIS — G4733 Obstructive sleep apnea (adult) (pediatric): Secondary | ICD-10-CM

## 2023-09-16 NOTE — Telephone Encounter (Signed)
-----   Message from Woodson He, MD sent at 09/16/2023  4:02 PM EDT ----- Please notify patient that PSG revealed mild OSA, recommend proceeding with APAP therapy set to 4-16 cm H2O, EPR 3 with the Airtouch N30i nasal mask. Please also schedule a 3 month follow up visit if patient wishes to proceed with CPAP therapy. Thanks

## 2023-09-16 NOTE — Telephone Encounter (Signed)
 Patient advised. Patient agreed to using CPAP. DME order placed. Fu appt scheduled. NFN.

## 2023-09-16 NOTE — Addendum Note (Signed)
 Addended by: Jobe Mulder on: 09/16/2023 04:21 PM   Modules accepted: Orders

## 2023-09-16 NOTE — Telephone Encounter (Signed)
 Please notify patient that PSG revealed mild OSA, recommend proceeding with APAP therapy set to 4-16 cm H2O, EPR 3 with the Airtouch N30i nasal mask. Please also schedule a 3 month follow up visit if patient wishes to proceed with CPAP therapy. Thanks

## 2023-09-27 ENCOUNTER — Other Ambulatory Visit: Payer: Self-pay | Admitting: Family Medicine

## 2023-09-27 DIAGNOSIS — T691XXA Chilblains, initial encounter: Secondary | ICD-10-CM

## 2023-09-30 ENCOUNTER — Encounter: Payer: Self-pay | Admitting: Pharmacist

## 2023-09-30 ENCOUNTER — Other Ambulatory Visit: Payer: Medicare (Managed Care) | Admitting: Pharmacist

## 2023-09-30 DIAGNOSIS — E119 Type 2 diabetes mellitus without complications: Secondary | ICD-10-CM

## 2023-09-30 DIAGNOSIS — E1159 Type 2 diabetes mellitus with other circulatory complications: Secondary | ICD-10-CM

## 2023-09-30 DIAGNOSIS — I480 Paroxysmal atrial fibrillation: Secondary | ICD-10-CM

## 2023-09-30 NOTE — Progress Notes (Signed)
 09/30/2023 Name: Regina Christensen MRN: [id number] DOB: 1955/07/31  Chief Complaint  Patient presents with   Medication Management   Medication Assistance    Regina Christensen is a 68 y.o. year old female who presented for a telephone visit.   They were referred to the pharmacist by their PCP for assistance in managing medication access.      Subjective:   Care Team: Primary Care Provider: Sowles, Krichna, MD; Next Scheduled Visit: 11/05/2023 Cardiologist: Constancia Delton, MD Heart Failure Specialist: Charlette Console, FNP Urologist: Oda Bence, PA-C Neurologist: Dennard Fisher, PA Healthy Weight and Wellness: Margie Sheller, NP; Next Scheduled Visit: 10/12/2023 Pulmonologist: Cleve Dale, MD; Next Scheduled Visit: 10/22/2023     Medication Access/Adherence  Current Pharmacy:  [facility], [place] - [address] [place] [address] Phone: [phone] Fax: [phone]  [facility] - [place]  - [place], [place] - [address] [place] [address] Phone: [phone] Fax: [phone]  [id number] [place], [place] - [address] [place] [address] Phone: [phone] Fax: [phone]   Patient reports affordability concerns with their medications: No Patient reports access/transportation concerns to their pharmacy: No  Patient reports adherence concerns with their medications:  No       Diabetes:   Current medications:  - Ozempic  2 mg weekly - Jardiance  10 mg daily before breakfast   Current glucose readings: morning fasting reading today: 104   Patient denies hypoglycemic s/sx including dizziness, shakiness, sweating.    Statin therapy: atorvastatin  10 mg daily   Current medication access support:  - Enrolled in patient assistance for Ozempic  from [employer] through 04/20/2024 - Enrolled in [employer]  Cardiomyopathy grant through 10/26/2023     Heart Failure/HTN:   Current medications:  ACEi/ARB/ARNI: olmesartan  20 mg daily SGLT2i: Jardiance  10 mg daily Beta blocker: metoprolol  ER 50 mg - 1.5 tablets (75 mg) twice daily Mineralocorticoid Receptor Antagonist: spironolactone  25 mg - 1/2 tablet (12.5 mg) daily Diuretic regimen: furosemide  40 mg daily as needed   Denies checking home blood pressure recently as needs to obtain new monitor   Denies symptoms of hypotension such as dizziness or lightheadedness   Patient denies volume overload signs or symptoms today     Current medication access support: Enrolled in [employer] Cardiomyopathy grant through 10/26/2023     Atrial Fibrillation:   Current medications: Rate Control: metoprolol  ER 50 mg - 1.5 tablets (75 mg) twice daily Anticoagulation Regimen: Eliquis  5 mg twice daily   Denies checking home blood pressure recently as needs to obtain new monitor     Objective:  Lab Results  Component Value Date   HGBA1C 5.8 (H) 06/22/2023    Lab Results  Component Value Date   CREATININE 0.77 06/22/2023   BUN 12 06/22/2023   NA 141 06/22/2023   K 4.2 06/22/2023   CL 104 06/22/2023   CO2 24 06/22/2023    Lab Results  Component Value Date   CHOL 124 03/03/2023   HDL 77 03/03/2023   LDLCALC 34 03/03/2023   TRIG 57 03/03/2023   CHOLHDL 1.6 03/03/2023   BP Readings from Last 3 Encounters:  09/11/23 138/82  08/25/23 (!) 165/94  08/21/23 138/76   Pulse Readings from Last 3 Encounters:  09/11/23 86  08/25/23 71  08/21/23 71    Medications Reviewed Today     Reviewed by Ardis Becton,  RPH-CPP (Pharmacist) on 09/30/23 at 1701  Med List Status: <None>   Medication Order Taking? Sig Documenting Provider Last Dose Status Informant  apixaban  (ELIQUIS ) 5 MG TABS tablet [id number]  Take 1 tablet (5 mg total) by mouth 2 (two) times daily. Constancia Delton, MD  Active   atorvastatin  (LIPITOR) 10 MG tablet  [id number] Yes TAKE 1 TABLET BY MOUTH ONCE DAILY Sowles, Krichna, MD Taking Active   Blood Pressure KIT [id number]  1 Units by Does not apply route daily. Napoleon Backer D, NP  Active   famotidine  (PEPCID ) 20 MG tablet [id number]  TAKE 1 TABLET BY MOUTH DAILY AS NEEDED FOR Elie Grove, Krichna, MD  Active   fexofenadine (ALLEGRA) 180 MG tablet [id number]  Take 180 mg by mouth daily as needed. [provider]  Active   furosemide  (LASIX ) 40 MG tablet [id number]  TAKE 1 TABLET BY MOUTH DAILY *REFILL REQUEST* Sowles, Krichna, MD  Active            Med Note Arlena Lacrosse, ALANA K   Thu Dec 25, 2022  8:43 AM) Takes as needed for wt gain  gabapentin  (NEURONTIN ) 300 MG capsule [id number]  Take 300 mg by mouth 3 (three) times daily as needed (for pain). [provider]  Active   JARDIANCE  10 MG TABS tablet [id number] Yes TAKE 1 TABLET BY MOUTH BEFORE BREAKFAST DAILY Charlette Console, FNP Taking Active   ketorolac  (ACULAR ) 0.5 % ophthalmic solution [id number]  Place 1 drop into the left eye 4 (four) times daily. [provider]  Active   meclizine  (ANTIVERT ) 12.5 MG tablet [id number]  Take 1 tablet (12.5 mg total) by mouth 3 (three) times daily as needed for dizziness. Sowles, Krichna, MD  Active   metoprolol  succinate (TOPROL -XL) 50 MG 24 hr tablet [id number] Yes TAKE 1 & 1/2 TABLETS BY MOUTH EVERY MORNING AND TAKE 1& 1/2 TABLET BY MOUTH EVERY DAY AT BEDTIME Boyce Byes, MD Taking Active   Multiple Vitamin (MULTIVITAMIN) tablet [id number]  Take 1 tablet by mouth daily. BariatricPal Multivitamin [provider]  Active Self  olmesartan  (BENICAR ) 20 MG tablet [id number] Yes Take 1 tablet (20 mg total) by mouth daily. Constancia Delton, MD Taking Active   pantoprazole  (PROTONIX ) 40 MG tablet [id number]  TAKE 1 TABLET BY MOUTH ONCE DAILY Sowles, Krichna, MD  Active   potassium chloride  (KLOR-CON  M) 10 MEQ tablet [id number]  TAKE 1 TABLET BY MOUTH ONCE DAILY Sowles, Krichna, MD  Active    prednisoLONE acetate (PRED FORTE) 1 % ophthalmic suspension [id number]  Place 1 drop into the left eye 3 (three) times daily. [provider]  Active   Semaglutide , 2 MG/DOSE, (OZEMPIC , 2 MG/DOSE,) 8 MG/3ML SOPN [id number] Yes INJECT 2MG  SUBCUTANEOUSLY ONCE WEEKLY Sowles, Krichna, MD Taking Active   spironolactone  (ALDACTONE ) 25 MG tablet [id number] Yes TAKE 1/2 TABLET BY MOUTH ONCE DAILY Charlette Console, FNP Taking Active   triamcinolone  (NASACORT ) 55 MCG/ACT AERO nasal inhaler [id number]  Place 2 sprays into the nose daily. [provider]  Active Self  triamcinolone  cream (KENALOG ) 0.1 % [id number]  Apply 1 Application topically 2 (two) times daily as needed. Sowles, Krichna, MD  Active   venlafaxine  XR (EFFEXOR -XR) 75 MG 24 hr capsule [id number]  Take 1 capsule (75 mg total) by mouth every morning. Sowles, Krichna, MD  Active   Vitamin D , Ergocalciferol , (DRISDOL ) 1.25 MG (50000 UNIT) CAPS capsule [id number]  Take 1 capsule (50,000 Units total) by mouth every 14 (fourteen) days. Marceil Sensor, DO  Active   XIIDRA  5 % SOLN [id number]  Apply 1 drop to eye 2 (two) times daily. [provider]  Active               Assessment/Plan:   Diabetes: - Currently controlled - Recommend to check glucose, keep log of results and have this record to review at upcoming medical appointments. Patient to contact provider office sooner if needed for readings outside of established parameters or symptoms - Patient to follow up with Ozempic  patient assistance as needed for refills - Assist patient with re-enrolling in [employer] Cardiomyopathy- Medicare Access grant - patient approved  Send patient new [employer] grant billing information via MyChart. Patient aware to provide this new information pharmacy after 10/27/2023 (when new grant becomes active)   Hypertension/ Heart Failure/Atrial Fibrillation: - Reviewed appropriate blood pressure monitoring technique and reviewed  goal blood pressure - Reviewed to weigh daily and when to contact cardiology with weight gain - Encourage patient to consider obtaining a new upper arm blood pressure monitor. Have discussed importance of having correct cuff size for accuracy of readings  Will collaborate with team to mail patient extra large upper arm cuff - Recommend to monitor home blood pressure, keep log of results and have this record to review at upcoming medical appointments. Patient to contact provider office sooner if needed for readings outside of established parameters or symptoms       Follow Up Plan: Clinical Pharmacist will follow up with patient by telephone on 12/30/2023 at 9:00 AM    Arthur Lash, PharmD, [facility] [phone]

## 2023-09-30 NOTE — Patient Instructions (Signed)
 Goals Addressed             This Visit's Progress    Pharmacy Goals       The goal A1c is less than 7%. This is the best way to reduce the risk of the long term complications of diabetes, including heart disease, kidney disease, eye disease, strokes, and nerve damage. An A1c of less than 7% corresponds with fasting sugars less than 130 and 2 hour after meal sugars less than 180.   If you need to reach out to patient assistance programs regarding refills or to find out the status of your application, you can do so by calling:  [employer] at [phone]  Thank you!  Estelle Grumbles, PharmD, [facility] [phone]

## 2023-10-12 ENCOUNTER — Encounter (INDEPENDENT_AMBULATORY_CARE_PROVIDER_SITE_OTHER): Payer: Self-pay | Admitting: Adult Health

## 2023-10-12 ENCOUNTER — Ambulatory Visit (INDEPENDENT_AMBULATORY_CARE_PROVIDER_SITE_OTHER): Payer: Medicare (Managed Care) | Admitting: Adult Health

## 2023-10-12 VITALS — BP 130/77 | HR 69 | Temp 97.8°F | Ht 68.0 in | Wt 246.0 lb

## 2023-10-12 DIAGNOSIS — I152 Hypertension secondary to endocrine disorders: Secondary | ICD-10-CM

## 2023-10-12 DIAGNOSIS — E669 Obesity, unspecified: Secondary | ICD-10-CM | POA: Diagnosis not present

## 2023-10-12 DIAGNOSIS — Z7985 Long-term (current) use of injectable non-insulin antidiabetic drugs: Secondary | ICD-10-CM

## 2023-10-12 DIAGNOSIS — E559 Vitamin D deficiency, unspecified: Secondary | ICD-10-CM | POA: Diagnosis not present

## 2023-10-12 DIAGNOSIS — E1169 Type 2 diabetes mellitus with other specified complication: Secondary | ICD-10-CM

## 2023-10-12 DIAGNOSIS — Z6837 Body mass index (BMI) 37.0-37.9, adult: Secondary | ICD-10-CM

## 2023-10-12 DIAGNOSIS — E1159 Type 2 diabetes mellitus with other circulatory complications: Secondary | ICD-10-CM

## 2023-10-12 DIAGNOSIS — Z6841 Body Mass Index (BMI) 40.0 and over, adult: Secondary | ICD-10-CM

## 2023-10-12 MED ORDER — VITAMIN D (ERGOCALCIFEROL) 1.25 MG (50000 UNIT) PO CAPS: 50000.0000 [IU] | ORAL_CAPSULE | ORAL | 0 refills | Status: DC

## 2023-10-12 NOTE — Progress Notes (Signed)
 WEIGHT SUMMARY AND BIOMETRICS  Vitals Temp: 97.8 F (36.6 C) BP: 130/77 Pulse Rate: 69 SpO2: 100 %   Anthropometric Measurements Height: 5' 8 (1.727 m) Weight: 246 lb (111.6 kg) BMI (Calculated): 37.41 Weight at Last Visit: 245 lb Weight Lost Since Last Visit: 0 Weight Gained Since Last Visit: 1 lb Starting Weight: 277 lb Total Weight Loss (lbs): 31 lb (14.1 kg)   Body Composition  Body Fat %: 47.1 % Fat Mass (lbs): 115.8 lbs Muscle Mass (lbs): 123.6 lbs Total Body Water (lbs): 91.8 lbs Visceral Fat Rating : 15   Other Clinical Data Fasting: yes Labs: no Today's Visit #: 32 Starting Date: 05/10/20    Chief Complaint:   OBESITY Regina Christensen is here to discuss her progress with her obesity treatment plan.  She is on the the Category 1 Plan and states she is following her eating plan approximately 70 % of the time.  She states she is exercising: None  Interim History:  Last OV with HWW was on 06/22/2023- fasting labs completed  She states I have been a messy eating  She endorses more snacking and eating off plan the last several months, ie: chips and ice cream  PCP has here on [employer] Pt Assistance Program for Ozepmic  She is currently on weekly Ozempic  2mg  Denies mass in neck, dysphagia, dyspepsia, persistent hoarseness, abdominal pain, or N/V/C   She continues to work PT- Clinical biochemist for [employer] She endorses stress r/t to rude, aggressive customers   Her granddaughter suffered her first seizure end of April 2025  Subjective:   1. Type 2 diabetes mellitus with other specified complication, without long-term current use of insulin  (HCC) Discussed Labs  Latest Reference Range & Units 06/22/23 08:40  Glucose 70 - 99 mg/dL 81  Hemoglobin J8R 4.8 - 5.6 % 5.8 (H)  Est. average glucose Bld gHb Est-mCnc mg/dL 879  INSULIN  2.6 - 24.9 uIU/mL 10.1  (H): Data is abnormally high  PCP has here on [employer] Pt Assistance Program for Ozepmic   She is currently on weekly Ozempic  2mg  Denies mass in neck, dysphagia, dyspepsia, persistent hoarseness, abdominal pain, or N/V/C  Cards manages daily Jardiance  10mg   CBG, A1c, Insulin  Levels all stable  She reports home fasting CBG will range high 90s to low 100s She denies sx's of hypogylcemia  2. Hypertension associated with type 2 diabetes mellitus (HCC) Discussed Labs  06/22/2023- CMP-Electrolytes, Kidney Fx, Liver Enzymes- normal BP stable at OV She denies CP with exertion  3. Vitamin D  deficiency Discussed Labs  Latest Reference Range & Units 06/22/23 08:40  Vitamin D , 25-Hydroxy 30.0 - 100.0 ng/mL 30.6   Vit D Level- low normal She is on bi-weekly Ergocalciferol   Assessment/Plan:   1. Type 2 diabetes mellitus with other specified complication, without long-term current use of insulin  (HCC) (Primary) Continue current antidiabetic regime Reduce sugar/simple CHO  2. Hypertension associated with type 2 diabetes mellitus (HCC) Limit Na+ Increase regular exercise   3. Vitamin D  deficiency Refill and INCREASE  Vitamin D , Ergocalciferol , (DRISDOL ) 1.25 MG (50000 UNIT) CAPS capsule Take 1 capsule (50,000 Units total) by mouth every 7 (seven) days. Dispense: 6 capsule, Refills: 0 ordered   4. Obesity, current BMI 37.4  [name] is not currently in the action stage of change. As such, her goal is to get back to weightloss efforts . She has agreed to the Category 1 Plan.   Multiple handouts: Cat 1 MP Grocery List Smart Fruit 100  Cal Snack List Tips for Meal Plan  Exercise goals: Older adults should follow the adult guidelines. When older adults cannot meet the adult guidelines, they should be as physically active as their abilities and conditions will allow.  Older adults should do exercises that maintain or improve balance if they are at risk of falling.  Older adults should determine their level of effort for physical activity relative to their level of fitness.   Older adults with chronic conditions should understand whether and how their conditions affect their ability to do regular physical activity safely.  Behavioral modification strategies: increasing lean protein intake, decreasing simple carbohydrates, increasing vegetables, increasing water intake, no skipping meals, meal planning and cooking strategies, keeping healthy foods in the home, ways to avoid boredom eating, and planning for success.  Taquanna has agreed to follow-up with our clinic in 4 weeks. She was informed of the importance of frequent follow-up visits to maximize her success with intensive lifestyle modifications for her multiple health conditions.   Objective:   Blood pressure 130/77, pulse 69, temperature 97.8 F (36.6 C), height 5' 8 (1.727 m), weight 246 lb (111.6 kg), SpO2 100%. Body mass index is 37.4 kg/m.  General: Cooperative, alert, well developed, in no acute distress. HEENT: Conjunctivae and lids unremarkable. Cardiovascular: Regular rhythm.  Lungs: Normal work of breathing. Neurologic: No focal deficits.   Lab Results  Component Value Date   CREATININE 0.77 06/22/2023   BUN 12 06/22/2023   NA 141 06/22/2023   K 4.2 06/22/2023   CL 104 06/22/2023   CO2 24 06/22/2023   Lab Results  Component Value Date   ALT 15 06/22/2023   AST 14 06/22/2023   ALKPHOS 100 06/22/2023   BILITOT 0.4 06/22/2023   Lab Results  Component Value Date   HGBA1C 5.8 (H) 06/22/2023   HGBA1C 6.0 (H) 02/23/2023   HGBA1C 5.4 10/29/2022   HGBA1C 6.1 (A) 06/25/2022   HGBA1C 6.0 (H) 02/10/2022   Lab Results  Component Value Date   INSULIN  10.1 06/22/2023   INSULIN  6.1 02/23/2023   INSULIN  8.0 08/28/2021   Lab Results  Component Value Date   TSH 1.359 03/02/2022   Lab Results  Component Value Date   CHOL 124 03/03/2023   HDL 77 03/03/2023   LDLCALC 34 03/03/2023   TRIG 57 03/03/2023   CHOLHDL 1.6 03/03/2023   Lab Results  Component Value Date   VD25OH 30.6  06/22/2023   VD25OH 68.4 02/23/2023   VD25OH 60 02/10/2022   Lab Results  Component Value Date   WBC 7.0 05/14/2022   HGB 14.6 05/14/2022   HCT 44.9 05/14/2022   MCV 89.6 05/14/2022   PLT 291 05/14/2022   Lab Results  Component Value Date   IRON 39 11/14/2020   TIBC 329 11/14/2020   FERRITIN 25 11/14/2020    Attestation Statements:   Reviewed by clinician on day of visit: allergies, medications, problem list, medical history, surgical history, family history, social history, and previous encounter notes.  I have reviewed the above documentation for accuracy and completeness, and I agree with the above. -  Juleah Paradise d. Katelind Pytel, NP-C

## 2023-10-22 ENCOUNTER — Ambulatory Visit: Payer: Medicare (Managed Care) | Admitting: Internal Medicine

## 2023-10-22 ENCOUNTER — Encounter: Payer: Self-pay | Admitting: Internal Medicine

## 2023-10-22 VITALS — BP 120/80 | HR 71 | Temp 98.0°F | Ht 68.0 in | Wt 250.8 lb

## 2023-10-22 DIAGNOSIS — G4733 Obstructive sleep apnea (adult) (pediatric): Secondary | ICD-10-CM

## 2023-10-22 NOTE — Patient Instructions (Signed)
 Recommend starting CPAP therapy Start auto CPAP 4-10 waiting Referral to DME company for AirFit P30i nasal mask  Avoid Allergens and Irritants Avoid secondhand smoke Avoid SICK contacts Recommend  Masking  when appropriate Recommend Keep up-to-date with vaccinations

## 2023-10-22 NOTE — Progress Notes (Signed)
 Name: Regina Christensen MRN: [id number] DOB: 1955/08/15    CHIEF COMPLAINT:  Follow-up assessment for OSA In-lab sleep study shows AHI of 5.9  HISTORY OF PRESENT ILLNESS:  CARDIAC HISTORY H/O HTN, paroxymal Afib s/p ablation 05/2022, CVA 02/2020 HfpEF, obesity, OSA  Echo 2021 LVEF 55-60%  November 2023 showed EF of 40 to 45%.   Echo July 2024 showed normalized EF 60 to 65%, G1DD.   Patient with diagnosis of obstructive sleep apnea several years ago patient has not used her CPAP over the last 1 year Patient states the pressures are too high Patient does use some type of nasal mask Patient with signs and symptoms of sleep apnea with mild OSA plan to restart CPAP with a nasal mask  No exacerbation at this time No evidence of heart failure at this time No evidence or signs of infection at this time No respiratory distress No fevers, chills, nausea, vomiting, diarrhea No evidence of lower extremity edema No evidence hemoptysis    PAST MEDICAL HISTORY :   has a past medical history of Anxiety, Arthritis, Back pain, BPPV (benign paroxysmal positional vertigo), CHF (congestive heart failure) (HCC), Chronic diastolic HF (heart failure) (HCC), Complication of anesthesia, Current use of long term anticoagulation, CVA (cerebral vascular accident) (HCC) (02/28/2020), Depression, Edema of both lower extremities, GERD (gastroesophageal reflux disease), Hypertension, IBS (irritable bowel syndrome), Lactose intolerance, Morbid obesity with BMI of 45.0-49.9, adult (HCC), Obesity, Osteoarthritis, PAF (paroxysmal atrial fibrillation) (HCC), Pre-diabetes, Sleep apnea, Type 2 diabetes mellitus without complication (HCC), and Vitamin D  deficiency.  has a past surgical history that includes Abdominal hysterectomy; Endometrial ablation; Breast excisional biopsy (Left); Total knee arthroplasty (Right, 06/2019); LAPAROSCOPIC SLEEVE GASTRECTOMY; Image guided sinus surgery (N/A, 08/08/2020); Ethmoidectomy  (Right, 08/08/2020); Maxillary antrostomy (Bilateral, 08/08/2020); Endoscopic concha bullosa resection (Bilateral, 08/08/2020); Nasal turbinate reduction (08/27/2020); Total knee arthroplasty (Left, 01/31/2021); Cardioversion (N/A, 03/05/2022); ATRIAL FIBRILLATION ABLATION (N/A, 05/29/2022); Colonoscopy with propofol  (N/A, 05/21/2023); and biopsy (05/21/2023). Prior to Admission medications   Medication Sig Start Date End Date Taking? Authorizing Provider  apixaban  (ELIQUIS ) 5 MG TABS tablet Take 1 tablet (5 mg total) by mouth 2 (two) times daily. 04/23/23   Darliss Rogue, MD  atorvastatin  (LIPITOR) 10 MG tablet TAKE 1 TABLET BY MOUTH ONCE DAILY 06/17/23   Sowles, Krichna, MD  Blood Pressure KIT 1 Units by Does not apply route daily. 01/26/23   Danford, Rockie D, NP  famotidine  (PEPCID ) 20 MG tablet TAKE 1 TABLET BY MOUTH DAILY AS NEEDED FOR INDIGESTION 06/17/23   Sowles, Krichna, MD  fexofenadine (ALLEGRA) 180 MG tablet Take 180 mg by mouth daily as needed.    [provider]  furosemide  (LASIX ) 40 MG tablet TAKE 1 TABLET BY MOUTH DAILY *REFILL REQUEST* 12/23/22   Glenard, Krichna, MD  gabapentin  (NEURONTIN ) 300 MG capsule Take 300 mg by mouth 3 (three) times daily as needed (for pain).    [provider]  JARDIANCE  10 MG TABS tablet TAKE 1 TABLET BY MOUTH BEFORE BREAKFAST DAILY 12/23/22   Donette [place] A, FNP  ketorolac  (ACULAR ) 0.5 % ophthalmic solution Place 1 drop into the left eye 4 (four) times daily. 05/29/23   [provider]  meclizine  (ANTIVERT ) 12.5 MG tablet Take 1 tablet (12.5 mg total) by mouth 3 (three) times daily as needed for dizziness. 06/25/22   Sowles, Krichna, MD  metoprolol  succinate (TOPROL -XL) 50 MG 24 hr tablet TAKE 1 & 1/2 TABLETS BY MOUTH EVERY MORNING AND TAKE 1& 1/2 TABLET BY MOUTH EVERY DAY  AT BEDTIME 12/24/22   Cindie Ole DASEN, MD  Multiple Vitamin (MULTIVITAMIN) tablet Take 1 tablet by mouth daily. BariatricPal Multivitamin    [provider]   olmesartan  (BENICAR ) 20 MG tablet Take 1 tablet (20 mg total) by mouth daily. 10/07/22   Darliss Rogue, MD  pantoprazole  (PROTONIX ) 40 MG tablet TAKE 1 TABLET BY MOUTH ONCE DAILY 06/17/23   Sowles, Krichna, MD  potassium chloride  (KLOR-CON  M) 10 MEQ tablet TAKE 1 TABLET BY MOUTH ONCE DAILY 04/16/23   Sowles, Krichna, MD  prednisoLONE acetate (PRED FORTE) 1 % ophthalmic suspension Place 1 drop into the left eye 3 (three) times daily. 05/29/23   [provider]  Semaglutide , 2 MG/DOSE, (OZEMPIC , 2 MG/DOSE,) 8 MG/3ML SOPN INJECT 2MG  SUBCUTANEOUSLY ONCE WEEKLY 12/23/22   Sowles, Krichna, MD  spironolactone  (ALDACTONE ) 25 MG tablet TAKE 1/2 TABLET BY MOUTH ONCE DAILY 12/23/22   [place] A, FNP  triamcinolone  (NASACORT ) 55 MCG/ACT AERO nasal inhaler Place 2 sprays into the nose daily.    [provider]  triamcinolone  cream (KENALOG ) 0.1 % Apply 1 Application topically 2 (two) times daily. Patient taking differently: Apply 1 Application topically 2 (two) times daily as needed. 05/06/23   Sowles, Krichna, MD  venlafaxine  XR (EFFEXOR -XR) 75 MG 24 hr capsule Take 1 capsule (75 mg total) by mouth every morning. 07/06/23   Sowles, Krichna, MD  Vitamin D , Ergocalciferol , (DRISDOL ) 1.25 MG (50000 UNIT) CAPS capsule Take 1 capsule (50,000 Units total) by mouth every 14 (fourteen) days. 05/11/23   Midge Sober, DO   Allergies  Allergen Reactions   Hydrocodone -Acetaminophen  Other (See Comments)    Extreme headache   Ace Inhibitors Cough   Hctz [Hydrochlorothiazide ] Rash    face    FAMILY HISTORY:  family history includes Cancer in her brother; Diabetes in her mother; Heart disease in her father; High blood pressure in her mother; Stroke in her sister. SOCIAL HISTORY:  reports that she has never smoked. She has never used smokeless tobacco. She reports that she does not drink alcohol and does not use drugs.  BP 120/80 (BP Location: Left Arm, Patient Position: Sitting, Cuff Size:  Large)   Pulse 71   Temp 98 F (36.7 C) (Oral)   Ht 5' 8 (1.727 m)   Wt 250 lb 12.8 oz (113.8 kg)   SpO2 97%   BMI 38.13 kg/m     Review of Systems: Gen:  Denies  fever, sweats, chills weight loss  HEENT: Denies blurred vision, double vision, ear pain, eye pain, hearing loss, nose bleeds, sore throat Cardiac:  No dizziness, chest pain or heaviness, chest tightness,edema, No JVD Resp:   No cough, -sputum production, -shortness of breath,-wheezing, -hemoptysis,  Other:  All other systems negative   Physical Examination:   General Appearance: No distress  EYES PERRLA, EOM intact.   NECK Supple, No JVD Pulmonary: normal breath sounds, No wheezing.  CardiovascularNormal S1,S2.  No m/r/g.   Abdomen: Benign, Soft, non-tender. Neurology UE/LE 5/5 strength, no focal deficits Ext pulses intact, cap refill intact ALL OTHER ROS ARE NEGATIVE      ASSESSMENT AND PLAN SYNOPSIS 68 year old morbidly obese female  Patient with  diagnosis of obstructive sleep apnea in the setting of obesity and deconditioned state   Assessment of sleep apnea Patient with symptomatic mild OSA Plan to start auto CPAP 4-10 Nasal mask AirFit P30i  Paroxysmal A-fib cardiomyopathy Follow-up with cardiology Continue anticoagulation as prescribed  Obesity -recommend significant weight loss -recommend changing diet  Deconditioned  state -Recommend increased daily activity and exercise  Hypertension - Sleep apnea can contribute to hypertension, therefore treatment of sleep apnea is important part of hypertension management.   Atrial Fibrillation - Sleep apnea can contribute to Atrial Fibrillation, therefore treatment of sleep apnea is important part of A fib management.    MEDICATION ADJUSTMENTS/LABS AND TESTS ORDERED: Recommend starting CPAP therapy Start auto CPAP 4-10 waiting Referral to DME company for AirFit P30i nasal mask Avoid Allergens and Irritants Avoid secondhand smoke Avoid  SICK contacts Recommend  Masking  when appropriate Recommend Keep up-to-date with vaccinations     CURRENT MEDICATIONS REVIEWED AT LENGTH WITH PATIENT TODAY   Patient  satisfied with Plan of action and management. All questions answered   Follow up 3 months   I spent a total of 45 minutes dedicated to the care of this patient on the date of this encounter to include pre-visit review of records, face-to-face time with the patient discussing conditions above, post visit ordering of testing, clinical documentation with the electronic health record, making appropriate referrals as documented, and communicating necessary information to the patient's healthcare team.    The Patient requires high complexity decision making for assessment and support, frequent evaluation and titration of therapies, application of advanced monitoring technologies and extensive interpretation of multiple databases.  Patient satisfied with Plan of action and management. All questions answered    Nickolas Alm Cellar, M.D.  [facility]  Medical Director [facility] Medical Director [facility] Cardio-Pulmonary Department

## 2023-11-04 ENCOUNTER — Other Ambulatory Visit: Payer: Self-pay | Admitting: Cardiology

## 2023-11-05 ENCOUNTER — Ambulatory Visit: Payer: Medicare (Managed Care) | Admitting: Family Medicine

## 2023-11-09 ENCOUNTER — Other Ambulatory Visit: Payer: Self-pay | Admitting: Family Medicine

## 2023-11-09 DIAGNOSIS — I5032 Chronic diastolic (congestive) heart failure: Secondary | ICD-10-CM

## 2023-11-16 ENCOUNTER — Other Ambulatory Visit: Payer: Self-pay | Admitting: Pharmacist

## 2023-11-16 ENCOUNTER — Encounter: Payer: Self-pay | Admitting: Pharmacist

## 2023-11-16 DIAGNOSIS — E119 Type 2 diabetes mellitus without complications: Secondary | ICD-10-CM

## 2023-11-16 NOTE — Progress Notes (Signed)
   11/16/2023  Patient ID: Regina Christensen, female   DOB: 11-01-1955, 68 y.o.   MRN: [id number]  Patient enrolled in Ozempic  patient assistance program from [employer].  As of 7/25, Ozempic  will no longer be eligible for automatic refill from [employer]. To request refills after this date, program must receive a refill request form from office up to 30 days prior to refill being due.  Outreach to patient today and provide this update. Advise patient to monitor supply of Ozempic  at home and when has only a 1 month supply remaining, to contact Shasta Spear: Phone: [phone] to request CPhT start refill form with PCP to be sent to program.  Sharyle Sia, PharmD, [facility] Clinical Pharmacist [facility] [phone]

## 2023-11-16 NOTE — Patient Instructions (Signed)
 As of 7/25, Ozempic  will no longer be automatically refilled from [employer] patient assistance program. To request refills after this date, program must receive a refill request form from office up to 30 days prior to refill being due.  Please monitor your supply of Ozempic  at home and when you have only a 1 month supply remaining, contact Shasta Spear at [phone] to request she start refill process for you.  Thank you!  Sharyle Sia, PharmD, [facility] Clinical Pharmacist [facility] [phone]

## 2023-11-17 ENCOUNTER — Encounter (INDEPENDENT_AMBULATORY_CARE_PROVIDER_SITE_OTHER): Payer: Self-pay | Admitting: Adult Health

## 2023-11-17 ENCOUNTER — Ambulatory Visit (INDEPENDENT_AMBULATORY_CARE_PROVIDER_SITE_OTHER): Payer: Medicare (Managed Care) | Admitting: Adult Health

## 2023-11-17 VITALS — BP 135/82 | HR 74 | Temp 97.9°F | Ht 68.0 in | Wt 246.0 lb

## 2023-11-17 DIAGNOSIS — E1159 Type 2 diabetes mellitus with other circulatory complications: Secondary | ICD-10-CM

## 2023-11-17 DIAGNOSIS — I5042 Chronic combined systolic (congestive) and diastolic (congestive) heart failure: Secondary | ICD-10-CM

## 2023-11-17 DIAGNOSIS — E1169 Type 2 diabetes mellitus with other specified complication: Secondary | ICD-10-CM | POA: Diagnosis not present

## 2023-11-17 DIAGNOSIS — Z7985 Long-term (current) use of injectable non-insulin antidiabetic drugs: Secondary | ICD-10-CM

## 2023-11-17 DIAGNOSIS — Z6837 Body mass index (BMI) 37.0-37.9, adult: Secondary | ICD-10-CM | POA: Diagnosis not present

## 2023-11-17 DIAGNOSIS — Z7984 Long term (current) use of oral hypoglycemic drugs: Secondary | ICD-10-CM | POA: Diagnosis not present

## 2023-11-17 DIAGNOSIS — Z6841 Body Mass Index (BMI) 40.0 and over, adult: Secondary | ICD-10-CM

## 2023-11-17 DIAGNOSIS — E669 Obesity, unspecified: Secondary | ICD-10-CM | POA: Diagnosis not present

## 2023-11-17 DIAGNOSIS — I152 Hypertension secondary to endocrine disorders: Secondary | ICD-10-CM | POA: Diagnosis not present

## 2023-11-17 DIAGNOSIS — E559 Vitamin D deficiency, unspecified: Secondary | ICD-10-CM

## 2023-11-17 MED ORDER — VITAMIN D (ERGOCALCIFEROL) 1.25 MG (50000 UNIT) PO CAPS: 50000.0000 [IU] | ORAL_CAPSULE | ORAL | 0 refills | Status: DC

## 2023-11-17 NOTE — Progress Notes (Signed)
 WEIGHT SUMMARY AND BIOMETRICS  Vitals Temp: 97.9 F (36.6 C) BP: 135/82 Pulse Rate: 74 SpO2: 97 %   Anthropometric Measurements Height: 5' 8 (1.727 m) Weight: 246 lb (111.6 kg) BMI (Calculated): 37.41 Weight at Last Visit: 246 lb Weight Lost Since Last Visit: 0 Weight Gained Since Last Visit: 0 Starting Weight: 277 lb Total Weight Loss (lbs): 31 lb (14.1 kg)   Body Composition  Body Fat %: 40.6 % Fat Mass (lbs): 100.2 lbs Muscle Mass (lbs): 139.2 lbs Total Body Water (lbs): 96.4 lbs Visceral Fat Rating : 13   Other Clinical Data Fasting: yes Labs: no Today's Visit #: 34 Starting Date: 05/10/20    Chief Complaint:   OBESITY Regina Christensen is here to discuss her progress with her obesity treatment plan.  She is on the the Category 1 Plan and states she is following her eating plan approximately 70-75 % of the time.  She states she is exercising: None  Interim History:  Ms. Voth provides the following food recall typical of a day: Breakfast: Skips or Boiled Eggs or serving of Grits Lunch: Salad with tuna packet Dinner:Chicken wings  Snacks: Ice Cream Greek yogurt Trial Mix  Hunger/appetite-she often skips breakfast due to lack of appetite  She is curious about the validity of the Pink Salt Diet- strongly encouraged her NOT to follow, ie: hx of CHF  She reports boredom at home- she lives alone, however has two adult children that are local. She feels that she spends most of her time seated, watching TV Recommend that she increase mind stimulating activities, ie: reading, word search, soduko Recommend that she join groups outside of home, ie: knitting club  Subjective:   1. Type 2 diabetes mellitus with other specified complication, without long-term current use of insulin  (HCC) Lab Results  Component Value Date   HGBA1C 5.8 (H) 06/22/2023   HGBA1C 6.0 (H) 02/23/2023   HGBA1C 5.4 10/29/2022    Home fasting CBG will range upper 90s to  110s She denies sx's of hypoglycemia She is on weekly Ozempic  2mg - administers on Tuesday. She will experience alternating between constipation and loose stools Denies mass in neck, dysphagia, dyspepsia, persistent hoarseness, abdominal pain PCP has here on [employer] Pt Assistance Program for Ozepmic  She is also on daily Jardiance  10mg   2. Hypertension associated with type 2 diabetes mellitus (HCC) BP stable at OV She denise CP with exertion  She is currently on: furosemide  (LASIX ) 40 MG tablet  spironolactone  (ALDACTONE ) 25 MG tablet  Semaglutide , 2 MG/DOSE, (OZEMPIC , 2 MG/DOSE,) 8 MG/3ML SOPN  JARDIANCE  10 MG TABS tablet  atorvastatin  (LIPITOR) 10 MG tablet  apixaban  (ELIQUIS ) 5 MG TABS tablet  olmesartan  (BENICAR ) 20 MG tablet  metoprolol  succinate (TOPROL -XL) 50 MG 24 hr tablet   3. Vitamin D  deficiency  Latest Reference Range & Units 06/22/23 08:40  Vitamin D , 25-Hydroxy 30.0 - 100.0 ng/mL 30.6   10/12/2023 Ergocalciferol  increased from Q14 days to Q7days She denies N/V/Muscle Weakness  4. Chronic combined systolic and diastolic CHF (congestive heart failure) (HCC) BP stable at OV  She is currently on: furosemide  (LASIX ) 40 MG tablet  spironolactone  (ALDACTONE ) 25 MG tablet  Semaglutide , 2 MG/DOSE, (OZEMPIC , 2 MG/DOSE,) 8 MG/3ML SOPN  JARDIANCE  10 MG TABS tablet  atorvastatin  (LIPITOR) 10 MG tablet  apixaban  (ELIQUIS ) 5 MG TABS tablet  olmesartan  (BENICAR ) 20 MG tablet  metoprolol  succinate (TOPROL -XL) 50 MG 24 hr tablet   06/10/2023 Cards OV Notes: Interval History:   Rashida  BRIYANNA BILLINGHAM is a 68 y.o. female who presents for a follow up visit.  I last saw the patient Sep 10, 2022 for her atrial fibrillation.  She has a prior catheter ablation for atrial fibrillation May 29, 2022.  She was previously on flecainide  but this was stopped.  She takes Eliquis  for stroke prophylaxis.  At the last appointment I started her on Toprol -XL and stopped her short acting  metoprolol  and diltiazem .   She is doing well today. No AF that she is aware of.   No dyspnea with exertion. No swelling. No syncope/presyncope. Assessment ASSESSMENT:     1. Paroxysmal A-fib (HCC)   2. Chronic systolic heart failure (HCC)     PLAN:     In order of problems listed above:   #Paroxysmal atrial fibrillation Maintaining sinus rhythm after prior catheter ablation. Continue metoprolol  succinate 75 mg by mouth once daily Continue Eliquis  5 mg by mouth twice daily   #Chronic systolic heart failure NYHA class II.  EF is recovered on July 2024 echo. Rhythm control indicated Continue spironolactone , olmesartan , metoprolol , Jardiance , Lasix    Follow-up with EP APP in 1 year. Follow up with gen cards in 6 months.  Assessment/Plan:   1. Type 2 diabetes mellitus with other specified complication, without long-term current use of insulin  (HCC) (Primary) Limit simple CHO/sugar Increase lean protein Continue closely monitor home CBG  2. Hypertension associated with type 2 diabetes mellitus (HCC) Limit Na+ intake   3. Vitamin D  deficiency Refill Vitamin D , Ergocalciferol , (DRISDOL ) 1.25 MG (50000 UNIT) CAPS capsule Take 1 capsule (50,000 Units total) by mouth every 7 (seven) days. Dispense: 6 capsule, Refills: 0 ordered   4. Chronic combined systolic and diastolic CHF (congestive heart failure) (HCC) Do NOT recommend Pink Salt Diet Remain well hydrated with water   5. Obesity, current BMI 37.5  Regina Christensen is not currently in the action stage of change. As such, her goal is to get back to weightloss efforts . She has agreed to the Category 1 Plan.   Exercise goals: Older adults should follow the adult guidelines. When older adults cannot meet the adult guidelines, they should be as physically active as their abilities and conditions will allow.  Older adults should do exercises that maintain or improve balance if they are at risk of falling.  Older adults should  determine their level of effort for physical activity relative to their level of fitness.  Older adults with chronic conditions should understand whether and how their conditions affect their ability to do regular physical activity safely. Seated exercises while watching TV.  Behavioral modification strategies: increasing lean protein intake, decreasing simple carbohydrates, increasing vegetables, increasing water intake, no skipping meals, meal planning and cooking strategies, keeping healthy foods in the home, ways to avoid boredom eating, ways to avoid night time snacking, better snacking choices, planning for success, and decreasing junk food.  Claryce has agreed to follow-up with our clinic in 4 weeks. She was informed of the importance of frequent follow-up visits to maximize her success with intensive lifestyle modifications for her multiple health conditions.   Check Fasting Labs at next OV  Objective:   Blood pressure 135/82, pulse 74, temperature 97.9 F (36.6 C), height 5' 8 (1.727 m), weight 246 lb (111.6 kg), SpO2 97%. Body mass index is 37.4 kg/m.  General: Cooperative, alert, well developed, in no acute distress. HEENT: Conjunctivae and lids unremarkable. Cardiovascular: Regular rhythm.  Lungs: Normal work of breathing. Neurologic: No focal deficits.   Lab  Results  Component Value Date   CREATININE 0.77 06/22/2023   BUN 12 06/22/2023   NA 141 06/22/2023   K 4.2 06/22/2023   CL 104 06/22/2023   CO2 24 06/22/2023   Lab Results  Component Value Date   ALT 15 06/22/2023   AST 14 06/22/2023   ALKPHOS 100 06/22/2023   BILITOT 0.4 06/22/2023   Lab Results  Component Value Date   HGBA1C 5.8 (H) 06/22/2023   HGBA1C 6.0 (H) 02/23/2023   HGBA1C 5.4 10/29/2022   HGBA1C 6.1 (A) 06/25/2022   HGBA1C 6.0 (H) 02/10/2022   Lab Results  Component Value Date   INSULIN  10.1 06/22/2023   INSULIN  6.1 02/23/2023   INSULIN  8.0 08/28/2021   Lab Results  Component Value  Date   TSH 1.359 03/02/2022   Lab Results  Component Value Date   CHOL 124 03/03/2023   HDL 77 03/03/2023   LDLCALC 34 03/03/2023   TRIG 57 03/03/2023   CHOLHDL 1.6 03/03/2023   Lab Results  Component Value Date   VD25OH 30.6 06/22/2023   VD25OH 68.4 02/23/2023   VD25OH 60 02/10/2022   Lab Results  Component Value Date   WBC 7.0 05/14/2022   HGB 14.6 05/14/2022   HCT 44.9 05/14/2022   MCV 89.6 05/14/2022   PLT 291 05/14/2022   Lab Results  Component Value Date   IRON 39 11/14/2020   TIBC 329 11/14/2020   FERRITIN 25 11/14/2020   Attestation Statements:   Reviewed by clinician on day of visit: allergies, medications, problem list, medical history, surgical history, family history, social history, and previous encounter notes.  I have reviewed the above documentation for accuracy and completeness, and I agree with the above. -  Sayf Kerner d. Syesha Thaw, NP-C

## 2023-11-25 ENCOUNTER — Telehealth: Payer: Self-pay

## 2023-11-25 NOTE — Telephone Encounter (Signed)
 Gave pt a call, pt is coming up due for re enrollment for [employer] Ozempic  on 12/24/23,left a HIPAA VM.

## 2023-11-27 NOTE — Telephone Encounter (Signed)
 Pt return call gave consent to fill out PAP [employer] online today and faxed provider portion today.

## 2023-11-27 NOTE — Telephone Encounter (Signed)
 Gave pt a call pt is coming due for re-enrollment on [employer] Ozempic  on 11/25/23, pt did not answer left a HIPAA VM.

## 2023-11-28 DIAGNOSIS — G4733 Obstructive sleep apnea (adult) (pediatric): Secondary | ICD-10-CM | POA: Diagnosis not present

## 2023-12-01 NOTE — Telephone Encounter (Signed)
 Received provider portion today ,faxed to [employer] today.

## 2023-12-03 NOTE — Telephone Encounter (Signed)
 Gave [employer] a call to follow up on PAP [employer] Ozempic,per [employer] pt has been approved thru 04/20/24 and has mail out med to provider office and will take 10-14 days,left a HIPAA VM at pt number.

## 2023-12-07 ENCOUNTER — Other Ambulatory Visit: Payer: Self-pay | Admitting: Family

## 2023-12-30 ENCOUNTER — Telehealth: Payer: Self-pay | Admitting: Pharmacist

## 2023-12-30 ENCOUNTER — Other Ambulatory Visit: Payer: Medicare (Managed Care) | Admitting: Pharmacist

## 2023-12-30 ENCOUNTER — Other Ambulatory Visit: Payer: Self-pay | Admitting: Family Medicine

## 2023-12-30 DIAGNOSIS — I5020 Unspecified systolic (congestive) heart failure: Secondary | ICD-10-CM

## 2023-12-30 DIAGNOSIS — I5032 Chronic diastolic (congestive) heart failure: Secondary | ICD-10-CM

## 2023-12-30 DIAGNOSIS — I480 Paroxysmal atrial fibrillation: Secondary | ICD-10-CM

## 2023-12-30 DIAGNOSIS — I152 Hypertension secondary to endocrine disorders: Secondary | ICD-10-CM

## 2023-12-30 DIAGNOSIS — E119 Type 2 diabetes mellitus without complications: Secondary | ICD-10-CM

## 2023-12-30 MED ORDER — FUROSEMIDE 40 MG PO TABS: 40.0000 mg | ORAL_TABLET | Freq: Every day | ORAL | 1 refills | Status: DC

## 2023-12-30 NOTE — Progress Notes (Unsigned)
 Patient requesting renewal of furosemide  prescription. Current prescription is expired.   Would you please send renewal to [facility] for patient?  Thank you!  Sharyle Sia, PharmD, [facility] [phone]

## 2023-12-30 NOTE — Patient Instructions (Signed)
 Goals Addressed             This Visit's Progress    Pharmacy Goals       The goal A1c is less than 7%. This is the best way to reduce the risk of the long term complications of diabetes, including heart disease, kidney disease, eye disease, strokes, and nerve damage. An A1c of less than 7% corresponds with fasting sugars less than 130 and 2 hour after meal sugars less than 180.   Please monitor your supply of Ozempic  at home and when you have only a 1 month supply remaining, contact Shasta Spear at [phone] to request she start refill process for you     Thank you!  Sharyle Sia, PharmD, [facility] [phone]       Check your blood pressure twice weekly, and any time you have concerning symptoms like headache, chest pain, dizziness, shortness of breath, or vision changes.   Our goal is less than 130/80.  To appropriately check your blood pressure, make sure you do the following:  1) Avoid caffeine, exercise, or tobacco products for 30 minutes before checking. Empty your bladder. 2) Sit with your back supported in a flat-backed chair. Rest your arm on something flat (arm of the chair, table, etc). 3) Sit still with your feet flat on the floor, resting, for at least 5 minutes.  4) Check your blood pressure. Take 1-2 readings.  5) Write down these readings and bring with you to any provider appointments.  Bring your home blood pressure machine with you to a provider's office for accuracy comparison at least once a year.   Make sure you take your blood pressure medications before you come to any office visit, even if you were asked to fast for labs.

## 2023-12-30 NOTE — Progress Notes (Signed)
 12/30/2023 Name: Regina Christensen MRN: [id number] DOB: 16-Mar-1956  Chief Complaint  Patient presents with   Medication Management   Medication Assistance    Regina Christensen is a 68 y.o. year old female who presented for a telephone visit.   They were referred to the pharmacist by their PCP for assistance in managing medication access.      Subjective:   Care Team: Primary Care Provider: Sowles, Krichna, MD; Next Scheduled Visit: 01/18/2024 Cardiologist: Darliss Rogue, MD Heart Failure Specialist: Donette Ellouise LABOR, FNP Urologist: Helon Clotilda LABOR, PA-C Neurologist: Jonette Lauraine Norris, PA [facility]: Jonel Rockie BIRCH, NP; Next Scheduled Visit: 12/31/2023 Pulmonologist: Isaiah Scrivener, MD; Next Scheduled Visit: 02/02/2024     Medication Access/Adherence  Current Pharmacy:  [facility], [place] - [address] [place] [address] Phone: [phone] Fax: [phone]  [facility] - [place]  - [place], [place] - [address] [place] [address] Phone: [phone] Fax: [phone]  [id number] [place], [place] - [address] [place] [address] Phone: [phone] Fax: [phone]   Patient reports affordability concerns with their medications: No Patient reports access/transportation concerns to their pharmacy: No  Patient reports adherence concerns with their medications:  No     Using weekly pillbox, but needs a new one as days of the week have rubbed off   Diabetes:   Current medications:  - Ozempic  2 mg weekly - Jardiance  10 mg daily before breakfast   Current glucose readings: morning fasting reading today: 21  Frustrated with trying to lose weight, but feels like Ozempic  continuing to help with appetite control. Plans to discuss further with [facility] provider tomorrow   Patient denies  hypoglycemic s/sx including dizziness, shakiness, sweating.    Statin therapy: atorvastatin  10 mg daily  Current Physical Activity: trying to add more steps when walking when in stores or doing other errands. Received exercises from [facility] currently doing ~2 times/week, but planning to increase frequency.   Current medication access support:  - Enrolled in patient assistance for Ozempic  from [employer] through 04/20/2024 - Enrolled in [employer] Cardiomyopathy grant through 10/25/2024      Heart Failure/HTN:   Current medications:  ACEi/ARB/ARNI: olmesartan  20 mg daily SGLT2i: Jardiance  10 mg daily Beta blocker: metoprolol  ER 50 mg - 1.5 tablets (75 mg) twice daily Mineralocorticoid Receptor Antagonist: spironolactone  25 mg - 1/2 tablet (12.5 mg) daily Diuretic regimen: furosemide  40 mg daily as needed   Patient using home blood pressure monitor with large size cuff. Recalls when last checked reading~130/90   Denies symptoms of hypotension such as dizziness or lightheadedness   Reports has been taking furosemide  more consistently this week as has noticed swelling in her ankles, which is improving with taking furosemide  as directed. Plans to discuss with provider at [facility] appointment tomorrow   Current Physical Activity: trying to add more steps when walking when in stores or doing other errands. Received exercises from [facility] currently doing ~2 times/week, but planning to increase frequency.   Current medication access support: Enrolled in [employer] Cardiomyopathy grant through 10/25/2024     Atrial Fibrillation:   Current medications: Rate Control: metoprolol  ER 50 mg - 1.5 tablets (75 mg) twice daily Anticoagulation Regimen: Eliquis  5 mg twice daily   Patient using home blood pressure monitor with large size cuff.  Recalls when last checked ~130/90   Denies symptoms of hypotension such as  dizziness or lightheadedness   Current Physical Activity: trying to add more steps when walking when in stores or doing other errands. Received exercises from [facility] currently doing ~2 times/week, but planning to increase frequency.   Objective:  Lab Results  Component Value Date   HGBA1C 5.8 (H) 06/22/2023    Lab Results  Component Value Date   CREATININE 0.77 06/22/2023   BUN 12 06/22/2023   NA 141 06/22/2023   K 4.2 06/22/2023   CL 104 06/22/2023   CO2 24 06/22/2023    Lab Results  Component Value Date   CHOL 124 03/03/2023   HDL 77 03/03/2023   LDLCALC 34 03/03/2023   TRIG 57 03/03/2023   CHOLHDL 1.6 03/03/2023   BP Readings from Last 3 Encounters:  11/17/23 135/82  10/22/23 120/80  10/12/23 130/77   Pulse Readings from Last 3 Encounters:  11/17/23 74  10/22/23 71  10/12/23 69     Medications Reviewed Today     Reviewed by Alana Sharyle LABOR, RPH-CPP (Pharmacist) on 12/30/23 at [phone]  Med List Status: <None>   Medication Order Taking? Sig Documenting Provider Last Dose Status Informant  amoxicillin  (AMOXIL ) 500 MG capsule [id number]  SMARTSIG:4 Capsule(s) By Mouth [provider]  Active   apixaban  (ELIQUIS ) 5 MG TABS tablet [id number] Yes Take 1 tablet (5 mg total) by mouth 2 (two) times daily. Darliss Rogue, MD  Active   atorvastatin  (LIPITOR) 10 MG tablet [id number] Yes TAKE 1 TABLET BY MOUTH ONCE DAILY Sowles, Krichna, MD  Active   Blood Pressure KIT [id number]  1 Units by Does not apply route daily. Jonel Pee D, NP  Active   famotidine  (PEPCID ) 20 MG tablet [id number]  TAKE 1 TABLET BY MOUTH DAILY AS NEEDED FOR FRANCIS Loron, Krichna, MD  Active   fexofenadine (ALLEGRA) 180 MG tablet [id number]  Take 180 mg by mouth daily as needed. [provider]  Active   furosemide  (LASIX ) 40 MG tablet [id number] Yes TAKE 1 TABLET BY MOUTH DAILY *REFILL REQUEST* Sowles, Krichna, MD  Active            Med Note LAVADA,  ALANA K   Thu Dec 25, 2022  8:43 AM) Emily as needed for wt gain  gabapentin  (NEURONTIN ) 300 MG capsule [id number]  Take 300 mg by mouth 3 (three) times daily as needed (for pain). [provider]  Active   JARDIANCE  10 MG TABS tablet [id number] Yes TAKE 1 TABLET BY MOUTH DAILY BEFORE OFILIA Darliss Rogue, MD  Active    Patient not taking:   Discontinued 12/30/23 0927 (Patient Preference)   meclizine  (ANTIVERT ) 12.5 MG tablet [id number]  Take 1 tablet (12.5 mg total) by mouth 3 (three) times daily as needed for dizziness. Sowles, Krichna, MD  Active   metoprolol  succinate (TOPROL -XL) 50 MG 24 hr tablet [id number] Yes Take 1.5 tablets (75 mg total) by mouth 2 (two) times daily. Cindie Ole DASEN, MD  Active   Multiple Vitamin (MULTIVITAMIN) tablet [id number]  Take 1 tablet by mouth daily. BariatricPal Multivitamin [provider]  Active Self  olmesartan  (BENICAR ) 20 MG tablet [id number] Yes Take 1 tablet (20 mg total) by mouth daily. Darliss Rogue, MD  Active   pantoprazole  (PROTONIX ) 40 MG tablet [id number]  TAKE 1 TABLET BY MOUTH ONCE DAILY Sowles, Krichna, MD  Active   potassium chloride  (KLOR-CON  M) 10 MEQ tablet [id number] Yes TAKE 1 TABLET  BY MOUTH ONCE DAILY Sowles, Krichna, MD  Active    Patient not taking:   Discontinued 12/30/23 0927 (Patient Preference)   Semaglutide , 2 MG/DOSE, (OZEMPIC , 2 MG/DOSE,) 8 MG/3ML SOPN [id number] Yes INJECT 2MG  SUBCUTANEOUSLY ONCE WEEKLY Sowles, Krichna, MD  Active   spironolactone  (ALDACTONE ) 25 MG tablet [id number] Yes TAKE 1/2 TABLET BY MOUTH ONCE DAILY Agbor-Etang, Redell, MD  Active   triamcinolone  (NASACORT ) 55 MCG/ACT AERO nasal inhaler [id number]  Place 2 sprays into the nose daily. [provider]  Active Self  triamcinolone  cream (KENALOG ) 0.1 % [id number]  Apply 1 Application topically 2 (two) times daily as needed. Sowles, Krichna, MD  Active   venlafaxine  XR (EFFEXOR -XR) 75 MG 24 hr capsule [id number]  Take 1 capsule (75 mg  total) by mouth every morning. Sowles, Krichna, MD  Active   Vitamin D , Ergocalciferol , (DRISDOL ) 1.25 MG (50000 UNIT) CAPS capsule [id number] Yes Take 1 capsule (50,000 Units total) by mouth every 7 (seven) days. Jonel Pee D, NP  Active   XIIDRA  5 % SOLN [id number] Yes Apply 1 drop to eye 2 (two) times daily. [provider]  Active               Assessment/Plan:   Discuss strategies to be more consistent with exercises from [facility]. Will add sticky notes to her work computer to help her remember  Plans to pick up a new weekly pillbox  Identify patient in need of refill of furosemide , but current Rx is expired. Send request for renewal to PCP/clinical team   Diabetes: - Currently controlled - Recommend to check glucose, keep log of results and have this record to review at upcoming medical appointments. Patient to contact provider office sooner if needed for readings outside of established parameters or symptoms - Patient to follow up with [facility] as needed for refills of Ozempic  from [employer] patient assistance  Hypertension/ Heart Failure/Atrial Fibrillation: - Reviewed appropriate blood pressure monitoring technique and reviewed goal blood pressure - Reviewed to weigh daily and when to contact cardiology with weight gain - Recommend to monitor home blood pressure, keep log of results and have this record to review at upcoming medical appointments. Patient to contact provider office sooner if needed for readings outside of established parameters or symptoms       Follow Up Plan: Schedule follow up for patient to speak with Clinical Pharmacist Peyton Ferries by telephone on 02/09/2024 at 9:00 AM         Sharyle Sia, PharmD, [facility] [phone]

## 2023-12-31 ENCOUNTER — Ambulatory Visit (INDEPENDENT_AMBULATORY_CARE_PROVIDER_SITE_OTHER): Payer: Medicare (Managed Care) | Admitting: Adult Health

## 2023-12-31 ENCOUNTER — Encounter (INDEPENDENT_AMBULATORY_CARE_PROVIDER_SITE_OTHER): Payer: Self-pay | Admitting: Adult Health

## 2023-12-31 ENCOUNTER — Other Ambulatory Visit (INDEPENDENT_AMBULATORY_CARE_PROVIDER_SITE_OTHER): Payer: Self-pay | Admitting: Adult Health

## 2023-12-31 VITALS — BP 128/71 | HR 71 | Temp 97.6°F | Ht 68.0 in | Wt 247.0 lb

## 2023-12-31 DIAGNOSIS — E559 Vitamin D deficiency, unspecified: Secondary | ICD-10-CM

## 2023-12-31 DIAGNOSIS — E1159 Type 2 diabetes mellitus with other circulatory complications: Secondary | ICD-10-CM | POA: Diagnosis not present

## 2023-12-31 DIAGNOSIS — Z7985 Long-term (current) use of injectable non-insulin antidiabetic drugs: Secondary | ICD-10-CM | POA: Diagnosis not present

## 2023-12-31 DIAGNOSIS — Z6841 Body Mass Index (BMI) 40.0 and over, adult: Secondary | ICD-10-CM

## 2023-12-31 DIAGNOSIS — Z6837 Body mass index (BMI) 37.0-37.9, adult: Secondary | ICD-10-CM | POA: Diagnosis not present

## 2023-12-31 DIAGNOSIS — E669 Obesity, unspecified: Secondary | ICD-10-CM

## 2023-12-31 DIAGNOSIS — I152 Hypertension secondary to endocrine disorders: Secondary | ICD-10-CM

## 2023-12-31 DIAGNOSIS — Z7984 Long term (current) use of oral hypoglycemic drugs: Secondary | ICD-10-CM

## 2023-12-31 DIAGNOSIS — E1169 Type 2 diabetes mellitus with other specified complication: Secondary | ICD-10-CM

## 2023-12-31 NOTE — Progress Notes (Signed)
 WEIGHT SUMMARY AND BIOMETRICS  Vitals Temp: 97.6 F (36.4 C) BP: 128/71 Pulse Rate: 71 SpO2: 97 %   Anthropometric Measurements Height: 5' 8 (1.727 m) Weight: 247 lb (112 kg) BMI (Calculated): 37.56 Weight at Last Visit: 246 lb Weight Lost Since Last Visit: 0 Weight Gained Since Last Visit: 1 lb Starting Weight: 277 lb Total Weight Loss (lbs): 30 lb (13.6 kg)   Body Composition  Body Fat %: 46.3 % Fat Mass (lbs): 114.4 lbs Muscle Mass (lbs): 126 lbs Total Body Water (lbs): 90.8 lbs Visceral Fat Rating : 15   Other Clinical Data Fasting: yes Labs: yes Today's Visit #: 34 Starting Date: 05/10/20    Chief Complaint:   OBESITY Regina Christensen is here to discuss her progress with her obesity treatment plan.  She is on the the Category 1 Plan and states she is following her eating plan approximately 80 % of the time.  She states she is exercising House Work/ Walking 45/30 minutes daily/2 times per week.  Interim History:  She continues to work PT- Forensic scientist- she has been consuming 2-3 servings of chips per day the last several weeks.   05/10/20 07:00  RMR 1119   Metabolism was slower than anticipated. Recommend rechecking IC at next OV  Subjective:   1. Type 2 diabetes mellitus with other specified complication, without long-term current use of insulin  (HCC) PCP manages weekly Ozempic  2mg  Cardiology manages daily Jardiance  10mg  Fasting CBG will range from high 90s to 110s She denies sx's of hypoglycemia  2. Vitamin D  deficiency  Latest Reference Range & Units 02/23/23 08:42 06/22/23 08:40  Vitamin D , 25-Hydroxy 30.0 - 100.0 ng/mL 68.4 30.6   She is on weekly Ergocalciferol - denies N/V/Muscle Weakness  3. Hypertension associated with type 2 diabetes mellitus (HCC) BP at goal at OV She is managed by PCP and Cardiology  Semaglutide , 2 MG/DOSE, (OZEMPIC , 2 MG/DOSE,) 8 MG/3ML SOPN  atorvastatin  (LIPITOR) 10 MG tablet  apixaban   (ELIQUIS ) 5 MG TABS tablet  olmesartan  (BENICAR ) 20 MG tablet  metoprolol  succinate (TOPROL -XL) 50 MG 24 hr tablet  JARDIANCE  10 MG TABS tablet  spironolactone  (ALDACTONE ) 25 MG tablet  furosemide  (LASIX ) 40 MG tablet    Assessment/Plan:   1. Type 2 diabetes mellitus with other specified complication, without long-term current use of insulin  (HCC) (Primary) Check Labs - Hemoglobin A1c - Insulin , random - Vitamin B12  2. Vitamin D  deficiency Check Labs - VITAMIN D  25 Hydroxy (Vit-D Deficiency, Fractures)  3. Hypertension associated with type 2 diabetes mellitus (HCC) Check Lab - Comprehensive metabolic panel with GFR  4. Obesity, current BMI 37.8  Regina Christensen is currently in the action stage of change. As such, her goal is to continue with weight loss efforts. She has agreed to the Category 1 Plan.   Exercise goals: Older adults should follow the adult guidelines. When older adults cannot meet the adult guidelines, they should be as physically active as their abilities and conditions will allow.  Older adults should do exercises that maintain or improve balance if they are at risk of falling.  Older adults should determine their level of effort for physical activity relative to their level of fitness.  Older adults with chronic conditions should understand whether and how their conditions affect their ability to do regular physical activity safely.  Behavioral modification strategies: increasing lean protein intake, decreasing simple carbohydrates, increasing vegetables, increasing water intake, no skipping meals, meal planning and cooking strategies, keeping healthy foods  in the home, ways to avoid boredom eating, ways to avoid night time snacking, better snacking choices, planning for success, and decreasing junk food.  Regina Christensen has agreed to follow-up with our clinic in 4 weeks. She was informed of the importance of frequent follow-up visits to maximize her success with intensive  lifestyle modifications for her multiple health conditions.   Regina Christensen was informed we would discuss her lab results at her next visit unless there is a critical issue that needs to be addressed sooner. Regina Christensen agreed to keep her next visit at the agreed upon time to discuss these results.  CHECK IC AT NEXT OV- pt aware to arrive 30 mins early and to be fasting  Objective:   Blood pressure 128/71, pulse 71, temperature 97.6 F (36.4 C), height 5' 8 (1.727 m), weight 247 lb (112 kg), SpO2 97%. Body mass index is 37.56 kg/m.  General: Cooperative, alert, well developed, in no acute distress. HEENT: Conjunctivae and lids unremarkable. Cardiovascular: Regular rhythm.  Lungs: Normal work of breathing. Neurologic: No focal deficits.   Lab Results  Component Value Date   CREATININE 0.77 06/22/2023   BUN 12 06/22/2023   NA 141 06/22/2023   K 4.2 06/22/2023   CL 104 06/22/2023   CO2 24 06/22/2023   Lab Results  Component Value Date   ALT 15 06/22/2023   AST 14 06/22/2023   ALKPHOS 100 06/22/2023   BILITOT 0.4 06/22/2023   Lab Results  Component Value Date   HGBA1C 5.8 (H) 06/22/2023   HGBA1C 6.0 (H) 02/23/2023   HGBA1C 5.4 10/29/2022   HGBA1C 6.1 (A) 06/25/2022   HGBA1C 6.0 (H) 02/10/2022   Lab Results  Component Value Date   INSULIN  10.1 06/22/2023   INSULIN  6.1 02/23/2023   INSULIN  8.0 08/28/2021   Lab Results  Component Value Date   TSH 1.359 03/02/2022   Lab Results  Component Value Date   CHOL 124 03/03/2023   HDL 77 03/03/2023   LDLCALC 34 03/03/2023   TRIG 57 03/03/2023   CHOLHDL 1.6 03/03/2023   Lab Results  Component Value Date   VD25OH 30.6 06/22/2023   VD25OH 68.4 02/23/2023   VD25OH 60 02/10/2022   Lab Results  Component Value Date   WBC 7.0 05/14/2022   HGB 14.6 05/14/2022   HCT 44.9 05/14/2022   MCV 89.6 05/14/2022   PLT 291 05/14/2022   Lab Results  Component Value Date   IRON 39 11/14/2020   TIBC 329 11/14/2020   FERRITIN 25  11/14/2020   Attestation Statements:   Reviewed by clinician on day of visit: allergies, medications, problem list, medical history, surgical history, family history, social history, and previous encounter notes.  I have reviewed the above documentation for accuracy and completeness, and I agree with the above. -  Destry Bezdek d. Darnell Stimson, NP-C

## 2024-01-01 LAB — COMPREHENSIVE METABOLIC PANEL WITH GFR
ALT: 12 IU/L (ref 0–32)
AST: 14 IU/L (ref 0–40)
Albumin: 4.1 g/dL (ref 3.9–4.9)
Alkaline Phosphatase: 93 IU/L (ref 44–121)
BUN/Creatinine Ratio: 16 (ref 12–28)
BUN: 15 mg/dL (ref 8–27)
Bilirubin Total: 0.4 mg/dL (ref 0.0–1.2)
CO2: 21 mmol/L (ref 20–29)
Calcium: 9.5 mg/dL (ref 8.7–10.3)
Chloride: 103 mmol/L (ref 96–106)
Creatinine, Ser: 0.92 mg/dL (ref 0.57–1.00)
Globulin, Total: 2.4 g/dL (ref 1.5–4.5)
Glucose: 83 mg/dL (ref 70–99)
Potassium: 4.2 mmol/L (ref 3.5–5.2)
Sodium: 140 mmol/L (ref 134–144)
Total Protein: 6.5 g/dL (ref 6.0–8.5)
eGFR: 68 mL/min/1.73 (ref >59)

## 2024-01-01 LAB — VITAMIN B12: Vitamin B-12: 863 pg/mL (ref 232–1245)

## 2024-01-01 LAB — INSULIN, RANDOM: INSULIN: 9.4 u[IU]/mL (ref 2.6–24.9)

## 2024-01-01 LAB — HEMOGLOBIN A1C
Est. average glucose Bld gHb Est-mCnc: 114 mg/dL
Hgb A1c MFr Bld: 5.6 % (ref 4.8–5.6)

## 2024-01-01 LAB — VITAMIN D 25 HYDROXY (VIT D DEFICIENCY, FRACTURES): Vit D, 25-Hydroxy: 45.3 ng/mL (ref 30.0–100.0)

## 2024-01-18 ENCOUNTER — Ambulatory Visit: Payer: Medicare (Managed Care) | Admitting: Family Medicine

## 2024-01-18 ENCOUNTER — Encounter: Payer: Self-pay | Admitting: Family Medicine

## 2024-01-18 VITALS — BP 132/80 | HR 74 | Resp 16 | Ht 68.0 in | Wt 253.9 lb

## 2024-01-18 DIAGNOSIS — R809 Proteinuria, unspecified: Secondary | ICD-10-CM

## 2024-01-18 DIAGNOSIS — Z8673 Personal history of transient ischemic attack (TIA), and cerebral infarction without residual deficits: Secondary | ICD-10-CM

## 2024-01-18 DIAGNOSIS — Z7984 Long term (current) use of oral hypoglycemic drugs: Secondary | ICD-10-CM

## 2024-01-18 DIAGNOSIS — Z23 Encounter for immunization: Secondary | ICD-10-CM | POA: Diagnosis not present

## 2024-01-18 DIAGNOSIS — I272 Pulmonary hypertension, unspecified: Secondary | ICD-10-CM | POA: Diagnosis not present

## 2024-01-18 DIAGNOSIS — I480 Paroxysmal atrial fibrillation: Secondary | ICD-10-CM | POA: Diagnosis not present

## 2024-01-18 DIAGNOSIS — K219 Gastro-esophageal reflux disease without esophagitis: Secondary | ICD-10-CM

## 2024-01-18 DIAGNOSIS — E1129 Type 2 diabetes mellitus with other diabetic kidney complication: Secondary | ICD-10-CM | POA: Diagnosis not present

## 2024-01-18 DIAGNOSIS — I5032 Chronic diastolic (congestive) heart failure: Secondary | ICD-10-CM

## 2024-01-18 NOTE — Progress Notes (Signed)
 Name: Regina Christensen   MRN: [id number]    DOB: 12-Jul-1955   Date:01/18/2024       Progress Note  Subjective  Chief Complaint  Chief Complaint  Patient presents with   Medical Management of Chronic Issues   Discussed the use of AI scribe software for clinical note transcription with the patient, who gave verbal consent to proceed.  History of Present Illness [name] is a 68 year old female with sleep apnea, obesity, atrial fibrillation, stroke, and heart failure who presents for a regular follow-up.  She is managing her sleep apnea with a CPAP machine, recently restarting its use with a nasal mask and an autopap setting ranging from 4 to 10 cm H2O. The pressure is more tolerable now, although the nasal mask remains bothersome at night.  She has morbid  obesity and is currently attending the [facility]. Her weight has increased from 246 lbs in July to 253 lbs today, with a BMI over 35. She is on an obesity treatment plan and finds it challenging to maintain consistency. She takes Ozempic  and Jardiance  for diabetes, which also help with appetite control. She recalls previously reaching a weight of 239 lbs and is concerned about the recent weight gain.  Her past medical history includes atrial fibrillation with a history of ablation, a stroke in November 2021, and heart failure with preserved ejection fraction. She takes several medications for these conditions, including metoprolol  50 mg, spironolactone  25 mg, olmesartan  20 mg, and furosemide  40 mg. She also takes Eliquis  5 mg twice daily for atrial fibrillation. She cannot lay flat at night due to shortness of breath and prefers to sleep reclined on her right side.  She is managing type 2 diabetes with Ozempic , Jardiance , and a low dose of atorvastatin  10 mg for dyslipidemia. She has difficulty drinking enough water and lacks a taste for it, but she is exploring ways to make it more palatable. A1C is at  goal   She takes Pepcid  20 mg and pantoprazole  40 mg for reflux, which is currently under control.   She also has a history of pulmonary hypertension, which is monitored with echocardiograms. Her sleep apnea management is crucial for this condition.  She receives her prescriptions mostly in 30-day supplies due to insurance arrangements. She takes potassium supplements as needed when using furosemide . She reports occasional swelling, which varies with diet.    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   Hypercoagulable state due to persistent atrial fibrillation (HCC) 06/26/2022   Abnormal CT of the abdomen 05/06/2022   Primary insomnia 04/25/2022   Chronic combined systolic and diastolic heart failure (HCC) 03/06/2022   BMI 36.0-36.9,adult 03/04/2022   HFrEF (heart failure with reduced ejection fraction) (HCC) 03/03/2022   Primary hypertension 03/03/2022   Atrial fibrillation with RVR (HCC) 03/02/2022   Dilated cardiomyopathy (HCC) 03/02/2022   Nonrheumatic tricuspid valve regurgitation 03/02/2022   Osteopenia of necks of both femurs 01/22/2022   History of total bilateral knee replacement 09/10/2021   History of blood transfusion 09/10/2020   Current use of long term anticoagulation    Diabetes mellitus (HCC)    GERD without esophagitis    Vitamin D  deficiency 04/12/2020   History of CVA (cerebrovascular accident)  without residual deficits 03/05/2020   History of hysterectomy 07/19/2019   MDD (major depressive disorder), recurrent, in full remission 07/19/2019   Insomnia due to mental condition 07/19/2019   Bilateral carpal tunnel syndrome 07/30/2016   OSA (obstructive sleep apnea) 03/07/2016   BPPV (benign paroxysmal positional vertigo) 08/28/2015    Hypertension associated with type 2 diabetes mellitus (HCC) 11/09/2014   GAD (generalized anxiety disorder) 11/09/2014   Acanthosis nigricans 11/09/2014   Morbid obesity (HCC) 01/06/2012    Past Surgical History:  Procedure Laterality Date   ABDOMINAL HYSTERECTOMY     ATRIAL FIBRILLATION ABLATION N/A 05/29/2022   Procedure: ATRIAL FIBRILLATION ABLATION;  Surgeon: Cindie Ole DASEN, MD;  Location: [facility];  Service: Cardiovascular;  Laterality: N/A;   BIOPSY  05/21/2023   Procedure: BIOPSY;  Surgeon: Jinny Carmine, MD;  Location: [facility];  Service: Endoscopy;;   BREAST EXCISIONAL BIOPSY Left    CARDIOVERSION N/A 03/05/2022   Procedure: CARDIOVERSION;  Surgeon: Darliss Rogue, MD;  Location: [facility];  Service: Cardiovascular;  Laterality: N/A;   COLONOSCOPY WITH PROPOFOL  N/A 05/21/2023   Procedure: COLONOSCOPY WITH PROPOFOL ;  Surgeon: Jinny Carmine, MD;  Location: [facility];  Service: Endoscopy;  Laterality: N/A;   ENDOMETRIAL ABLATION     x2   ENDOSCOPIC CONCHA BULLOSA RESECTION Bilateral 08/08/2020   Procedure: ENDOSCOPIC CONCHA BULLOSA RESECTION;  Surgeon: Edda Mt, MD;  Location: [facility];  Service: ENT;  Laterality: Bilateral;   ETHMOIDECTOMY Right 08/08/2020   Procedure: ETHMOIDECTOMY, LEFT PARTIAL ETHMOIDECTOMY;  Surgeon: Edda Mt, MD;  Location: [facility];  Service: ENT;  Laterality: Right;   IMAGE GUIDED SINUS SURGERY N/A 08/08/2020   Procedure: IMAGE GUIDED SINUS SURGERY, RIGHT FRONTAL SINUSOTOMY;  Surgeon: Edda Mt, MD;  Location: [facility];  Service: ENT;  Laterality: N/A;   LAPAROSCOPIC SLEEVE GASTRECTOMY     MAXILLARY ANTROSTOMY Bilateral 08/08/2020   Procedure: MAXILLARY ANTROSTOMY with tissue;  Surgeon: Edda Mt, MD;  Location: [facility];  Service: ENT;  Laterality: Bilateral;   NASAL TURBINATE REDUCTION  08/27/2020   Procedure: TURBINATE REDUCTION /SUBMUCOSAL DEBRIDEMENT;  Surgeon: Edda Mt, MD;  Location: [facility];   Service: ENT;;   TOTAL KNEE ARTHROPLASTY Right 06/2019   TOTAL KNEE ARTHROPLASTY Left 01/31/2021   [facility]    Family History  Problem Relation Age of Onset   Diabetes Mother    High blood pressure Mother    Heart disease Father    Stroke Sister    Cancer Brother    Mental illness Neg Hx     Social History   Tobacco Use   Smoking status: Never   Smokeless tobacco: Never  Substance Use Topics   Alcohol use: No    Alcohol/week: 0.0 standard drinks of alcohol    Comment: rare     Current Outpatient Medications:    apixaban  (ELIQUIS ) 5 MG TABS tablet, Take 1 tablet (5 mg total) by mouth 2 (two) times daily., Disp: 180 tablet, Rfl: 1   atorvastatin  (LIPITOR) 10 MG tablet, TAKE 1 TABLET BY MOUTH ONCE DAILY, Disp: 30 tablet, Rfl: 10   Blood Pressure KIT, 1 Units by Does not apply route daily., Disp: 1 kit, Rfl: 0   famotidine  (PEPCID ) 20 MG tablet, TAKE 1 TABLET BY MOUTH DAILY AS NEEDED FOR INDIGESTION, Disp: 30 tablet, Rfl: 10   fexofenadine (ALLEGRA) 180 MG tablet, Take 180 mg by mouth daily as needed., Disp: , Rfl:    furosemide  (LASIX ) 40 MG tablet, Take 1 tablet (40 mg  total) by mouth daily., Disp: 90 tablet, Rfl: 1   gabapentin  (NEURONTIN ) 300 MG capsule, Take 300 mg by mouth 3 (three) times daily as needed (for pain)., Disp: , Rfl:    JARDIANCE  10 MG TABS tablet, TAKE 1 TABLET BY MOUTH DAILY BEFORE BREAKFAST, Disp: 30 tablet, Rfl: 11   meclizine  (ANTIVERT ) 12.5 MG tablet, Take 1 tablet (12.5 mg total) by mouth 3 (three) times daily as needed for dizziness., Disp: 30 tablet, Rfl: 0   metoprolol  succinate (TOPROL -XL) 50 MG 24 hr tablet, Take 1.5 tablets (75 mg total) by mouth 2 (two) times daily., Disp: 270 tablet, Rfl: 3   Multiple Vitamin (MULTIVITAMIN) tablet, Take 1 tablet by mouth daily. BariatricPal Multivitamin, Disp: , Rfl:    olmesartan  (BENICAR ) 20 MG tablet, Take 1 tablet (20 mg total) by mouth daily., Disp: 90 tablet, Rfl: 3   pantoprazole   (PROTONIX ) 40 MG tablet, TAKE 1 TABLET BY MOUTH ONCE DAILY, Disp: 30 tablet, Rfl: 10   potassium chloride  (KLOR-CON  M) 10 MEQ tablet, TAKE 1 TABLET BY MOUTH ONCE DAILY, Disp: 30 tablet, Rfl: 10   Semaglutide , 2 MG/DOSE, (OZEMPIC , 2 MG/DOSE,) 8 MG/3ML SOPN, INJECT 2MG  SUBCUTANEOUSLY ONCE WEEKLY, Disp: 3 mL, Rfl: 2   spironolactone  (ALDACTONE ) 25 MG tablet, TAKE 1/2 TABLET BY MOUTH ONCE DAILY, Disp: 15 tablet, Rfl: 11   triamcinolone  (NASACORT ) 55 MCG/ACT AERO nasal inhaler, Place 2 sprays into the nose daily., Disp: , Rfl:    triamcinolone  cream (KENALOG ) 0.1 %, Apply 1 Application topically 2 (two) times daily as needed., Disp: 30 g, Rfl: 0   venlafaxine  XR (EFFEXOR -XR) 75 MG 24 hr capsule, Take 1 capsule (75 mg total) by mouth every morning., Disp: 30 capsule, Rfl: 10   Vitamin D , Ergocalciferol , (DRISDOL ) 1.25 MG (50000 UNIT) CAPS capsule, Take 1 capsule (50,000 Units total) by mouth every 7 (seven) days., Disp: 6 capsule, Rfl: 0   XIIDRA  5 % SOLN, Apply 1 drop to eye 2 (two) times daily., Disp: , Rfl:   Allergies  Allergen Reactions   Hydrocodone -Acetaminophen  Other (See Comments)    Extreme headache   Ace Inhibitors Cough   Hctz [Hydrochlorothiazide ] Rash    face    I personally reviewed active problem list, medication list, allergies with the patient/caregiver today.   ROS  Ten systems reviewed and is negative except as mentioned in HPI    Objective Physical Exam  CONSTITUTIONAL: Patient appears well-developed and well-nourished. No distress. HEENT: Head atraumatic, normocephalic, neck supple. CARDIOVASCULAR: Normal rate, regular rhythm and normal heart sounds. No murmur heard. No BLE edema. PULMONARY: Effort normal and breath sounds normal. No respiratory distress. Lungs clear to auscultation. ABDOMINAL: There is no tenderness or distention. MUSCULOSKELETAL: Normal gait. Without gross motor or sensory deficit. PSYCHIATRIC: Patient has a normal mood and affect. Behavior is  normal. Judgment and thought content normal.   Vitals:   01/18/24 1548  BP: 132/80  Pulse: 74  Resp: 16  SpO2: 99%  Weight: 253 lb 14.4 oz (115.2 kg)  Height: 5' 8 (1.727 m)    Body mass index is 38.61 kg/m.  Recent Results (from the past 2160 hours)  Comprehensive metabolic panel with GFR     Status: None   Collection Time: 12/31/23  8:38 AM  Result Value Ref Range   Glucose 83 70 - 99 mg/dL   BUN 15 8 - 27 mg/dL   Creatinine, Ser 9.07 0.57 - 1.00 mg/dL   eGFR 68 >40 fO/fpw/8.26   BUN/Creatinine Ratio 16 12 -  28   Sodium 140 134 - 144 mmol/L   Potassium 4.2 3.5 - 5.2 mmol/L   Chloride 103 96 - 106 mmol/L   CO2 21 20 - 29 mmol/L   Calcium  9.5 8.7 - 10.3 mg/dL   Total Protein 6.5 6.0 - 8.5 g/dL   Albumin 4.1 3.9 - 4.9 g/dL   Globulin, Total 2.4 1.5 - 4.5 g/dL   Bilirubin Total 0.4 0.0 - 1.2 mg/dL   Alkaline Phosphatase 93 44 - 121 IU/L    Comment: **Effective January 04, 2024 Alkaline Phosphatase**   reference interval will be changing to:              Age                Female          Female           0 -  5 days         47 - 127       47 - 127           6 - 10 days         29 - 242       29 - 242          11 - 20 days        109 - 357      109 - 357          21 - 30 days         94 - 494       94 - 494           1 -  2 months      149 - 539      149 - 539           3 -  6 months      131 - 452      131 - 452           7 - 11 months      117 - 401      117 - 401   12 months -  6 years       158 - 369      158 - 369           7 - 12 years       150 - 409      150 - 409               13 years       156 - 435       78 - 227               14 years       114 - 375       64 - 161               15 years        88 - 279       56 - 134               16 years        74 - 207       51 - 121               17 years        63 - 161       47 - 113  18 - 20 years        51 - 125       42 - 106          21 - 50 years         47 - 123       41 - 116          51 - 80  years        49 - 135       51 - 125              >80 years        48 - 129       48 - 129    AST 14 0 - 40 IU/L   ALT 12 0 - 32 IU/L  Hemoglobin A1c     Status: None   Collection Time: 12/31/23  8:38 AM  Result Value Ref Range   Hgb A1c MFr Bld 5.6 4.8 - 5.6 %    Comment:          Prediabetes: 5.7 - 6.4          Diabetes: >6.4          Glycemic control for adults with diabetes: <7.0    Est. average glucose Bld gHb Est-mCnc 114 mg/dL  Insulin , random     Status: None   Collection Time: 12/31/23  8:38 AM  Result Value Ref Range   INSULIN  9.4 2.6 - 24.9 uIU/mL  VITAMIN D  25 Hydroxy (Vit-D Deficiency, Fractures)     Status: None   Collection Time: 12/31/23  8:38 AM  Result Value Ref Range   Vit D, 25-Hydroxy 45.3 30.0 - 100.0 ng/mL    Comment: Vitamin D  deficiency has been defined by the Institute of Medicine and an Endocrine Society practice guideline as a level of serum 25-OH vitamin D  less than 20 ng/mL (1,2). The Endocrine Society went on to further define vitamin D  insufficiency as a level between 21 and 29 ng/mL (2). 1. IOM (Institute of Medicine). 2010. Dietary reference    intakes for calcium  and D. [place]: The    [employer]. 2. Holick MF, Binkley Harrison, Bischoff-Ferrari HA, et al.    Evaluation, treatment, and prevention of vitamin D     deficiency: an Endocrine Society clinical practice    guideline. JCEM. 2011 Jul; 96(7):1911-30.   Vitamin B12     Status: None   Collection Time: 12/31/23  8:38 AM  Result Value Ref Range   Vitamin B-12 863 232 - 1,245 pg/mL    Diabetic Foot Exam:  Diabetic foot exam was performed with the following findings:   No deformities, ulcerations, or other skin breakdown Normal sensation of 10g monofilament Intact posterior tibialis and dorsalis pedis pulses      PHQ2/9:    01/18/2024    3:42 PM 09/11/2023    9:05 AM 08/12/2023    8:44 AM 07/06/2023    8:18 AM 05/06/2023    3:09 PM  Depression screen PHQ 2/9   Decreased Interest 0 0 0 0 0  Down, Depressed, Hopeless 0 0 0 0 0  PHQ - 2 Score 0 0 0 0 0  Altered sleeping 0 0 0 0 0  Tired, decreased energy 0 0 0 0 0  Change in appetite 0 0 0 0 0  Feeling bad or failure about yourself  0 0 0 0 0  Trouble concentrating 0 0 0 0 0  Moving  slowly or fidgety/restless 0 0 0 0 0  Suicidal thoughts 0 0 0 0 0  PHQ-9 Score 0 0 0 0 0  Difficult doing work/chores Not difficult at all Not difficult at all Not difficult at all Not difficult at all Not difficult at all    phq 9 is negative  Fall Risk:    01/18/2024    3:42 PM 09/11/2023    9:05 AM 08/12/2023    8:40 AM 07/06/2023    8:12 AM 05/06/2023    3:09 PM  Fall Risk   Falls in the past year? 0 0 0 0 0  Number falls in past yr: 0 0 0 0 0  Injury with Fall? 0 0 0 0 0  Risk for fall due to : No Fall Risks No Fall Risks No Fall Risks No Fall Risks No Fall Risks  Follow up Falls evaluation completed Falls evaluation completed Falls prevention discussed;Falls evaluation completed Falls prevention discussed;Education provided;Falls evaluation completed Falls prevention discussed;Education provided;Falls evaluation completed      Assessment & Plan Morbid obesity Obesity with BMI >35, complicated by stroke and sleep apnea risk factors. Weight increased from 239 lbs to 253 lbs since July. - Continue obesity treatment plan. - Encourage consistency in dietary habits. - Consider adding flavor to water to increase intake, such as lime juice or NUN tablets.  Type 2 diabetes mellitus with associated Dyslipidemia, HTN and obesity Type 2 diabetes mellitus well-controlled with normal A1c. Managed with Ozempic  and Jardiance , aiding in weight management. - Continue Ozempic  and Jardiance  as prescribed. - Monitor blood glucose levels regularly.  Chronic diastolic heart failure Chronic diastolic heart failure managed with medication. Experiences orthopnea and occasional trace edema, managed with furosemide  and  potassium supplementation. - Continue furosemide  40 mg every morning. - Take potassium supplementation with furosemide . - Monitor for symptoms of fluid overload and adjust furosemide  as needed.  Hypertension Hypertension managed with spironolactone , olmesartan , and metoprolol . Blood pressure control is part of heart failure and atrial fibrillation management. - Continue spironolactone  25 mg daily. - Continue olmesartan  20 mg daily. - Continue metoprolol  50 mg daily.  Paroxysmal atrial fibrillation, status post ablation Paroxysmal atrial fibrillation, status post ablation, managed with metoprolol  and Eliquis . No side effects from Eliquis . - Continue metoprolol  50 mg daily. - Continue Eliquis  5 mg twice daily.  Pulmonary hypertension Pulmonary hypertension monitored with echocardiograms. Management includes addressing sleep apnea and heart failure. - Continue management of sleep apnea and heart failure.  Obstructive sleep apnea Obstructive sleep apnea managed with CPAP therapy. Adjusting to nasal mask and autopap settings, more comfortable than previous settings. - Continue using CPAP with nasal mask and autopap settings.  Dyslipidemia Dyslipidemia managed with atorvastatin . Lipid levels due for re-evaluation at year-end. - Continue atorvastatin  10 mg daily. - Order lipid panel at the end of the year.  Gastroesophageal reflux disease Gastroesophageal reflux disease managed with pantoprazole  and Pepcid . Symptoms under control. - Continue pantoprazole  40 mg as needed. - Continue Pepcid  20 mg as needed.

## 2024-01-29 DIAGNOSIS — R809 Proteinuria, unspecified: Secondary | ICD-10-CM | POA: Diagnosis not present

## 2024-01-29 DIAGNOSIS — E1129 Type 2 diabetes mellitus with other diabetic kidney complication: Secondary | ICD-10-CM | POA: Diagnosis not present

## 2024-01-29 LAB — LIPID PANEL
Cholesterol: 122 mg/dL (ref <200)
HDL: 81 mg/dL (ref >=50)
LDL Cholesterol (Calc): 28 mg/dL
Non-HDL Cholesterol (Calc): 41 mg/dL (ref <130)
Total CHOL/HDL Ratio: 1.5 (calc) (ref <5.0)
Triglycerides: 51 mg/dL (ref <150)

## 2024-01-30 LAB — MICROALBUMIN / CREATININE URINE RATIO
Creatinine, Urine: 269 mg/dL (ref 20–275)
Microalb Creat Ratio: 4 mg/g{creat} (ref <30)
Microalb, Ur: 1.1 mg/dL

## 2024-02-01 ENCOUNTER — Ambulatory Visit: Payer: Self-pay | Admitting: Family Medicine

## 2024-02-02 ENCOUNTER — Encounter: Payer: Self-pay | Admitting: Internal Medicine

## 2024-02-02 ENCOUNTER — Ambulatory Visit: Payer: Medicare (Managed Care) | Admitting: Internal Medicine

## 2024-02-02 VITALS — BP 122/80 | HR 75 | Temp 98.1°F | Ht 68.0 in | Wt 252.4 lb

## 2024-02-02 DIAGNOSIS — Z6838 Body mass index (BMI) 38.0-38.9, adult: Secondary | ICD-10-CM | POA: Diagnosis not present

## 2024-02-02 DIAGNOSIS — I4891 Unspecified atrial fibrillation: Secondary | ICD-10-CM

## 2024-02-02 DIAGNOSIS — G4733 Obstructive sleep apnea (adult) (pediatric): Secondary | ICD-10-CM

## 2024-02-02 DIAGNOSIS — I10 Essential (primary) hypertension: Secondary | ICD-10-CM | POA: Diagnosis not present

## 2024-02-02 NOTE — Progress Notes (Signed)
 Name: KANDRA GRAVEN MRN: [id number] DOB: [birth year]    CHIEF COMPLAINT:  Follow-up assessment for OSA In-lab sleep study shows AHI of 5.9  HISTORY OF PRESENT ILLNESS:  CARDIAC HISTORY H/O HTN, paroxymal Afib s/p ablation 05/2022, CVA 02/2020 HfpEF, obesity, OSA  Echo 2021 LVEF 55-60%  November 2023 showed EF of 40 to 45%.   Echo July 2024 showed normalized EF 60 to 65%, G1DD.   Patient with diagnosis of obstructive sleep apnea several years ago patient has not used her CPAP over the last 1 year Patient states the pressures are too high Patient does use some type of nasal mask Patient with signs and symptoms of sleep apnea with mild OSA plan to restart CPAP with a nasal mask   Discussed sleep data and reviewed with patient.  Encouraged proper weight management.  Discussed driving precautions and its relationship with hypersomnolence.  Discussed sleep hygiene, and benefits of a fixed sleep waked time.  The importance of getting eight or more hours of sleep discussed with patient.  Discussed limiting the use of the computer and television before bedtime.  Decrease naps during the day, so night time sleep will become enhanced.  Limit caffeine, and sleep deprivation.   Patient uses and benefits from therapy Using CPAP nightly and with naps Patient encouraged to use more frequently and for more hours I did not have enough download data I have explained to her I need more data so we will follow-up in 3 months   No exacerbation at this time No evidence of heart failure at this time No evidence or signs of infection at this time No respiratory distress No fevers, chills, nausea, vomiting, diarrhea No evidence of lower extremity edema No evidence hemoptysis   PAST MEDICAL HISTORY :   has a past medical history of Anxiety, Arthritis, Back pain, BPPV (benign paroxysmal positional vertigo), CHF (congestive heart failure) (HCC), Chronic diastolic HF (heart failure) (HCC),  Complication of anesthesia, Current use of long term anticoagulation, CVA (cerebral vascular accident) (HCC) (02/28/2020), Depression, Edema of both lower extremities, GERD (gastroesophageal reflux disease), Hypertension, IBS (irritable bowel syndrome), Lactose intolerance, Morbid obesity with BMI of 45.0-49.9, adult (HCC), Obesity, Osteoarthritis, PAF (paroxysmal atrial fibrillation) (HCC), Pre-diabetes, Sleep apnea, Type 2 diabetes mellitus without complication, and Vitamin D  deficiency.  has a past surgical history that includes Abdominal hysterectomy; Endometrial ablation; Breast excisional biopsy (Left); Total knee arthroplasty (Right, 06/2019); LAPAROSCOPIC SLEEVE GASTRECTOMY; Image guided sinus surgery (N/A, 08/08/2020); Ethmoidectomy (Right, 08/08/2020); Maxillary antrostomy (Bilateral, 08/08/2020); Endoscopic concha bullosa resection (Bilateral, 08/08/2020); Nasal turbinate reduction (08/27/2020); Total knee arthroplasty (Left, 01/31/2021); Cardioversion (N/A, 03/05/2022); ATRIAL FIBRILLATION ABLATION (N/A, 05/29/2022); Colonoscopy with propofol  (N/A, 05/21/2023); and biopsy (05/21/2023). Prior to Admission medications   Medication Sig Start Date End Date Taking? Authorizing Provider  apixaban  (ELIQUIS ) 5 MG TABS tablet Take 1 tablet (5 mg total) by mouth 2 (two) times daily. 04/23/23   Darliss Rogue, MD  atorvastatin  (LIPITOR) 10 MG tablet TAKE 1 TABLET BY MOUTH ONCE DAILY 06/17/23   Sowles, Krichna, MD  Blood Pressure KIT 1 Units by Does not apply route daily. 01/26/23   Danford, Rockie D, NP  famotidine  (PEPCID ) 20 MG tablet TAKE 1 TABLET BY MOUTH DAILY AS NEEDED FOR INDIGESTION 06/17/23   Sowles, Krichna, MD  fexofenadine (ALLEGRA) 180 MG tablet Take 180 mg by mouth daily as needed.    [provider]  furosemide  (LASIX ) 40 MG tablet TAKE 1 TABLET BY MOUTH DAILY *REFILL REQUEST* 12/23/22   Sowles, Krichna,  MD  gabapentin  (NEURONTIN ) 300 MG capsule Take 300 mg by mouth 3 (three) times daily as  needed (for pain).    [provider]  JARDIANCE  10 MG TABS tablet TAKE 1 TABLET BY MOUTH BEFORE BREAKFAST DAILY 12/23/22   Donette [place] A, FNP  ketorolac  (ACULAR ) 0.5 % ophthalmic solution Place 1 drop into the left eye 4 (four) times daily. 05/29/23   [provider]  meclizine  (ANTIVERT ) 12.5 MG tablet Take 1 tablet (12.5 mg total) by mouth 3 (three) times daily as needed for dizziness. 06/25/22   Sowles, Krichna, MD  metoprolol  succinate (TOPROL -XL) 50 MG 24 hr tablet TAKE 1 & 1/2 TABLETS BY MOUTH EVERY MORNING AND TAKE 1& 1/2 TABLET BY MOUTH EVERY DAY AT BEDTIME 12/24/22   Cindie Ole DASEN, MD  Multiple Vitamin (MULTIVITAMIN) tablet Take 1 tablet by mouth daily. BariatricPal Multivitamin    [provider]  olmesartan  (BENICAR ) 20 MG tablet Take 1 tablet (20 mg total) by mouth daily. 10/07/22   Darliss Rogue, MD  pantoprazole  (PROTONIX ) 40 MG tablet TAKE 1 TABLET BY MOUTH ONCE DAILY 06/17/23   Sowles, Krichna, MD  potassium chloride  (KLOR-CON  M) 10 MEQ tablet TAKE 1 TABLET BY MOUTH ONCE DAILY 04/16/23   Sowles, Krichna, MD  prednisoLONE acetate (PRED FORTE) 1 % ophthalmic suspension Place 1 drop into the left eye 3 (three) times daily. 05/29/23   [provider]  Semaglutide , 2 MG/DOSE, (OZEMPIC , 2 MG/DOSE,) 8 MG/3ML SOPN INJECT 2MG  SUBCUTANEOUSLY ONCE WEEKLY 12/23/22   Sowles, Krichna, MD  spironolactone  (ALDACTONE ) 25 MG tablet TAKE 1/2 TABLET BY MOUTH ONCE DAILY 12/23/22   [place] A, FNP  triamcinolone  (NASACORT ) 55 MCG/ACT AERO nasal inhaler Place 2 sprays into the nose daily.    [provider]  triamcinolone  cream (KENALOG ) 0.1 % Apply 1 Application topically 2 (two) times daily. Patient taking differently: Apply 1 Application topically 2 (two) times daily as needed. 05/06/23   Sowles, Krichna, MD  venlafaxine  XR (EFFEXOR -XR) 75 MG 24 hr capsule Take 1 capsule (75 mg total) by mouth every morning. 07/06/23   Sowles, Krichna, MD  Vitamin D ,  Ergocalciferol , (DRISDOL ) 1.25 MG (50000 UNIT) CAPS capsule Take 1 capsule (50,000 Units total) by mouth every 14 (fourteen) days. 05/11/23   Midge Sober, DO   Allergies  Allergen Reactions   Hydrocodone -Acetaminophen  Other (See Comments)    Extreme headache   Ace Inhibitors Cough   Hctz [Hydrochlorothiazide ] Rash    face    FAMILY HISTORY:  family history includes Cancer in her brother; Diabetes in her mother; Heart disease in her father; High blood pressure in her mother; Stroke in her sister. SOCIAL HISTORY:  reports that she has never smoked. She has never used smokeless tobacco. She reports that she does not drink alcohol and does not use drugs.  BP 122/80   Pulse 75   Temp 98.1 F (36.7 C)   Ht 5' 8 (1.727 m)   Wt 252 lb 6.4 oz (114.5 kg)   SpO2 96%   BMI 38.38 kg/m     Review of Systems: Gen:  Denies  fever, sweats, chills weight loss  HEENT: Denies blurred vision, double vision, ear pain, eye pain, hearing loss, nose bleeds, sore throat Cardiac:  No dizziness, chest pain or heaviness, chest tightness,edema, No JVD Resp:   No cough, -sputum production, -shortness of breath,-wheezing, -hemoptysis,  Other:  All other systems negative   Physical Examination:   General Appearance: No distress  EYES PERRLA, EOM intact.  NECK Supple, No JVD Pulmonary: normal breath sounds, No wheezing.  CardiovascularNormal S1,S2.  No m/r/g.   Abdomen: Benign, Soft, non-tender. Neurology UE/LE 5/5 strength, no focal deficits Ext pulses intact, cap refill intact ALL OTHER ROS ARE NEGATIVE    ASSESSMENT AND PLAN SYNOPSIS 68 year old morbidly obese female  Patient with  diagnosis of obstructive sleep apnea in the setting of obesity and deconditioned state   Assessment of sleep apnea Patient with symptomatic mild OSA auto CPAP 4-10 Nasal mask AirFit P30i Download reviewed in detail patient missed several days however her AHI is reduced to 1.6 Patient is slowly getting  used to it I recommended that she check her humidity setting so that she does not have a dry mouth usage around 3-1/2 hours   Obesity -recommend significant weight loss -recommend changing diet  Deconditioned state -Recommend increased daily activity and exercise  Hypertension - Sleep apnea can contribute to hypertension, therefore treatment of sleep apnea is important part of hypertension management.   Atrial Fibrillation - Sleep apnea can contribute to Atrial Fibrillation, therefore treatment of sleep apnea is important part of A fib management. Paroxysmal A-fib cardiomyopathy Follow-up with cardiology Continue anticoagulation as prescribed  MEDICATION ADJUSTMENTS/LABS AND TESTS ORDERED: Continue CPAP therapy auto CPAP 4-10 waiting AirFit P30i nasal mask Avoid Allergens and Irritants Avoid secondhand smoke Avoid SICK contacts Recommend  Masking  when appropriate Recommend Keep up-to-date with vaccinations    CURRENT MEDICATIONS REVIEWED AT LENGTH WITH PATIENT TODAY   Patient  satisfied with Plan of action and management. All questions answered   Follow up 3 months   I spent a total of 48 minutes dedicated to the care of this patient on the date of this encounter to include pre-visit review of records, face-to-face time with the patient discussing conditions above, post visit ordering of testing, clinical documentation with the electronic health record, making appropriate referrals as documented, and communicating necessary information to the patient's healthcare team.    The Patient requires high complexity decision making for assessment and support, frequent evaluation and titration of therapies, application of advanced monitoring technologies and extensive interpretation of multiple databases.  Patient satisfied with Plan of action and management. All questions answered    Nickolas Alm Cellar, M.D.  [facility] Pulmonary & Critical Care Medicine  Medical Director  [facility] [place]

## 2024-02-02 NOTE — Patient Instructions (Signed)
 Continue CPAP as prescribed  Avoid Allergens and Irritants Avoid secondhand smoke Avoid SICK contacts Recommend  Masking  when appropriate Recommend Keep up-to-date with vaccinations  Recommend weight loss

## 2024-02-04 ENCOUNTER — Encounter (INDEPENDENT_AMBULATORY_CARE_PROVIDER_SITE_OTHER): Payer: Self-pay | Admitting: Adult Health

## 2024-02-04 ENCOUNTER — Ambulatory Visit (INDEPENDENT_AMBULATORY_CARE_PROVIDER_SITE_OTHER): Payer: Medicare (Managed Care) | Admitting: Adult Health

## 2024-02-04 VITALS — BP 126/80 | HR 67 | Temp 98.1°F | Ht 68.0 in | Wt 248.0 lb

## 2024-02-04 DIAGNOSIS — E1169 Type 2 diabetes mellitus with other specified complication: Secondary | ICD-10-CM | POA: Diagnosis not present

## 2024-02-04 DIAGNOSIS — E669 Obesity, unspecified: Secondary | ICD-10-CM | POA: Diagnosis not present

## 2024-02-04 DIAGNOSIS — E559 Vitamin D deficiency, unspecified: Secondary | ICD-10-CM | POA: Diagnosis not present

## 2024-02-04 DIAGNOSIS — I152 Hypertension secondary to endocrine disorders: Secondary | ICD-10-CM | POA: Diagnosis not present

## 2024-02-04 DIAGNOSIS — Z6837 Body mass index (BMI) 37.0-37.9, adult: Secondary | ICD-10-CM | POA: Diagnosis not present

## 2024-02-04 DIAGNOSIS — E1159 Type 2 diabetes mellitus with other circulatory complications: Secondary | ICD-10-CM

## 2024-02-04 DIAGNOSIS — Z7985 Long-term (current) use of injectable non-insulin antidiabetic drugs: Secondary | ICD-10-CM

## 2024-02-04 DIAGNOSIS — I480 Paroxysmal atrial fibrillation: Secondary | ICD-10-CM

## 2024-02-04 DIAGNOSIS — R0602 Shortness of breath: Secondary | ICD-10-CM | POA: Diagnosis not present

## 2024-02-04 DIAGNOSIS — Z6841 Body Mass Index (BMI) 40.0 and over, adult: Secondary | ICD-10-CM

## 2024-02-04 MED ORDER — VITAMIN D (ERGOCALCIFEROL) 1.25 MG (50000 UNIT) PO CAPS: 50000.0000 [IU] | ORAL_CAPSULE | ORAL | 0 refills | Status: DC

## 2024-02-04 NOTE — Progress Notes (Signed)
 WEIGHT SUMMARY AND BIOMETRICS  Vitals Temp: 98.1 F (36.7 C) BP: 126/80 Pulse Rate: 67 SpO2: 97 %   Anthropometric Measurements Height: 5' 8 (1.727 m) Weight: 248 lb (112.5 kg) BMI (Calculated): 37.72 Weight at Last Visit: 247 lb Weight Lost Since Last Visit: 0 Weight Gained Since Last Visit: 1 lb Starting Weight: 277 lb Total Weight Loss (lbs): 29 lb (13.2 kg)   Body Composition  Body Fat %: 47.3 % Fat Mass (lbs): 117.6 lbs Muscle Mass (lbs): 124.6 lbs Total Body Water (lbs): 92.4 lbs Visceral Fat Rating : 15   Other Clinical Data RMR: 1584 Fasting: yes Labs: yes Today's Visit #: 35 Starting Date: 05/10/20 Comments: IC,fasting labs today    Chief Complaint:   OBESITY Regina Christensen is here to discuss her progress with her obesity treatment plan.  She is on the the Category 1 Plan and states she is following her eating plan approximately 70 % of the time.  She states she is exercising: None  Interim History:  02/02/2024 Chronic f/u with Pulmonology Her CPAP settings were adjusted and she has worn device the last two nights.  She reports feeling stuck  Home fasting CBG will range high 90s to mid 120s   Subjective:   1. Hypertension associated with type 2 diabetes mellitus (HCC) Discussed Labs 12/31/2023 CMP: Electrolytes, Kidney Fx, Liver Enzymes- normal  2. SOB (shortness of breath) on exertion She endorses dyspnea with extreme exertion, denies CP   02/04/24 08:00  RMR 1584   Metabolism slower than expected  4. Vitamin D  deficiency Discussed Labs  Latest Reference Range & Units 12/31/23 08:38  Vitamin D , 25-Hydroxy 30.0 - 100.0 ng/mL 45.3       Vit D level stable, just under goal of 50-70  5. Type 2 diabetes mellitus with other specified complication, without long-term current use of insulin  (HCC) Discussed Labs Vitamin B12 232 - 1,245 pg/mL 863    Latest Reference Range & Units 12/31/23 08:38  Glucose 70 - 99 mg/dL 83   Hemoglobin J8R 4.8 - 5.6 % 5.6  Est. average glucose Bld gHb Est-mCnc mg/dL 885  INSULIN  2.6 - 24.9 uIU/mL 9.4   B12 level normal CBG, A1c normal Insulin  level improved and just above goal of 5 PCP manages weekly Ozempic  2mg  Denies mass in neck, dysphagia, dyspepsia, persistent hoarseness, abdominal pain, or N/V/C  Cardiology manages daily Jardiance  10mg  Fasting CBG will range from high 90s to 110s She denies sx's of hypoglycemia  6. AF (paroxysmal atrial fibrillation) (HCC) She denies recent palpitations or dizziness She is currently on atorvastatin  (LIPITOR) 10 MG tablet  apixaban  (ELIQUIS ) 5 MG TABS tablet  olmesartan  (BENICAR ) 20 MG tablet  metoprolol  succinate (TOPROL -XL) 50 MG 24 hr tablet  spironolactone  (ALDACTONE ) 25 MG tablet  furosemide  (LASIX ) 40 MG tablet   Assessment/Plan:   1. Hypertension associated with type 2 diabetes mellitus (HCC) Increase regular cardiovascular exercise  2. SOB (shortness of breath) on exertion (Primary) Check IC Add 100 snack calories= 200 snack cal/day  3. Vitamin D  deficiency Refill  Vitamin D , Ergocalciferol , (DRISDOL ) 1.25 MG (50000 UNIT) CAPS capsule Take 1 capsule (50,000 Units total) by mouth every 7 (seven) days. Dispense: 6 capsule, Refills: 0 ordered   4. Type 2 diabetes mellitus with other specified complication, without long-term current use of insulin  (HCC) Limit sugar/simple CHO intake Increase lean protein intake Increase regular cardiovascular exercise  5. AF (paroxysmal atrial fibrillation) (HCC) Limit caffeine and ETOH Increase regular cardiovascular exercise  6.  Obesity, current BMI 37.8  Truly is not currently in the action stage of change. As such, her goal is to get back to weightloss efforts . She has agreed to the Category 1 Plan. +100 cal  Exercise goals: Older adults should follow the adult guidelines. When older adults cannot meet the adult guidelines, they should be as physically active as their  abilities and conditions will allow.  Older adults should do exercises that maintain or improve balance if they are at risk of falling.  Older adults should determine their level of effort for physical activity relative to their level of fitness.  Older adults with chronic conditions should understand whether and how their conditions affect their ability to do regular physical activity safely. Walk 10 mins 2 x week  Behavioral modification strategies: increasing lean protein intake, decreasing simple carbohydrates, increasing vegetables, increasing water intake, meal planning and cooking strategies, keeping healthy foods in the home, ways to avoid boredom eating, and planning for success.  Avalynn has agreed to follow-up with our clinic in 4 weeks. She was informed of the importance of frequent follow-up visits to maximize her success with intensive lifestyle modifications for her multiple health conditions.   Objective:   Blood pressure 126/80, pulse 67, temperature 98.1 F (36.7 C), height 5' 8 (1.727 m), weight 248 lb (112.5 kg), SpO2 97%. Body mass index is 37.71 kg/m.  General: Cooperative, alert, well developed, in no acute distress. HEENT: Conjunctivae and lids unremarkable. Cardiovascular: Regular rhythm.  Lungs: Normal work of breathing. Neurologic: No focal deficits.   Lab Results  Component Value Date   CREATININE 0.92 12/31/2023   BUN 15 12/31/2023   NA 140 12/31/2023   K 4.2 12/31/2023   CL 103 12/31/2023   CO2 21 12/31/2023   Lab Results  Component Value Date   ALT 12 12/31/2023   AST 14 12/31/2023   ALKPHOS 93 12/31/2023   BILITOT 0.4 12/31/2023   Lab Results  Component Value Date   HGBA1C 5.6 12/31/2023   HGBA1C 5.8 (H) 06/22/2023   HGBA1C 6.0 (H) 02/23/2023   HGBA1C 5.4 10/29/2022   HGBA1C 6.1 (A) 06/25/2022   Lab Results  Component Value Date   INSULIN  9.4 12/31/2023   INSULIN  10.1 06/22/2023   INSULIN  6.1 02/23/2023   INSULIN  8.0 08/28/2021    Lab Results  Component Value Date   TSH 1.359 03/02/2022   Lab Results  Component Value Date   CHOL 122 01/29/2024   HDL 81 01/29/2024   LDLCALC 28 01/29/2024   TRIG 51 01/29/2024   CHOLHDL 1.5 01/29/2024   Lab Results  Component Value Date   VD25OH 45.3 12/31/2023   VD25OH 30.6 06/22/2023   VD25OH 68.4 02/23/2023   Lab Results  Component Value Date   WBC 7.0 05/14/2022   HGB 14.6 05/14/2022   HCT 44.9 05/14/2022   MCV 89.6 05/14/2022   PLT 291 05/14/2022   Lab Results  Component Value Date   IRON 39 11/14/2020   TIBC 329 11/14/2020   FERRITIN 25 11/14/2020    Attestation Statements:   Reviewed by clinician on day of visit: allergies, medications, problem list, medical history, surgical history, family history, social history, and previous encounter notes.  I have reviewed the above documentation for accuracy and completeness, and I agree with the above. -  Jadeyn Hargett d. Samari Gorby, NP-C

## 2024-02-09 ENCOUNTER — Other Ambulatory Visit (INDEPENDENT_AMBULATORY_CARE_PROVIDER_SITE_OTHER): Payer: Medicare (Managed Care)

## 2024-02-09 DIAGNOSIS — R809 Proteinuria, unspecified: Secondary | ICD-10-CM

## 2024-02-09 DIAGNOSIS — I5032 Chronic diastolic (congestive) heart failure: Secondary | ICD-10-CM

## 2024-02-09 DIAGNOSIS — E1129 Type 2 diabetes mellitus with other diabetic kidney complication: Secondary | ICD-10-CM

## 2024-02-09 NOTE — Progress Notes (Signed)
 S:     Chief Complaint  Patient presents with   Medication Management    Reason for visit: ?  Regina Christensen is a 68 y.o. female with a history of diabetes (type 2), who presents today for a follow up diabetes Telephone pharmacotherapy visit.? Pertinent PMH also includes Afib, HTN, OSA, GERD, insomnia, MDD, GAD, HFpEF  Care Team: Primary Care Provider: Sowles, Krichna, MD  Current diabetes medications include: Ozempic  2 mg weekly, Jardiance  10 mg daily Previous diabetes medications include: Victoza , Mounjaro  Current hypertension medications include: Toprol  XL 75 mg BID, olmesartan  20 mg daily, spironolactone  12.5 mg daily Current hyperlipidemia medications include: atorvastatin  10 mg daily  Patient reports adherence to taking all medications as prescribed.   Have you been experiencing any side effects to the medications prescribed? no Do you have any problems obtaining medications due to transportation or finances? no Insurance coverage: [employer]  Current medication access support: - Enrolled in patient assistance for Ozempic  from [employer] through 04/20/2024 - Enrolled in [employer] Cardiomyopathy grant through 10/25/2024   Patient denies hypoglycemic events.   DM Prevention:  Statin: Taking; moderate intensity.?  History of albuminuria? yes, last UACR on 01/29/24 = 4 mg/g Lab Results  Component Value Date   HMDIABEYEEXA No Retinopathy 07/08/2023   Tobacco Use:  Tobacco Use: Low Risk  (02/04/2024)   Patient History    Smoking Tobacco Use: Never    Smokeless Tobacco Use: Never    Passive Exposure: Not on file   O:   Vitals:  Wt Readings from Last 3 Encounters:  02/04/24 248 lb (112.5 kg)  02/02/24 252 lb 6.4 oz (114.5 kg)  01/18/24 253 lb 14.4 oz (115.2 kg)   BP Readings from Last 3 Encounters:  02/04/24 126/80  02/02/24 122/80  01/18/24 132/80   Pulse Readings from Last 3 Encounters:  02/04/24 67  02/02/24 75  01/18/24 74      Labs:?  Lab Results  Component Value Date   HGBA1C 5.6 12/31/2023   HGBA1C 5.8 (H) 06/22/2023   HGBA1C 6.0 (H) 02/23/2023   GLUCOSE 83 12/31/2023   MICRALBCREAT 4 01/29/2024   MICRALBCREAT 7 03/03/2023   MICRALBCREAT 60 (H) 02/10/2022   CREATININE 0.92 12/31/2023   CREATININE 0.77 06/22/2023   CREATININE 0.81 02/23/2023    Lab Results  Component Value Date   CHOL 122 01/29/2024   LDLCALC 28 01/29/2024   LDLCALC 34 03/03/2023   LDLCALC 30 02/10/2022   HDL 81 01/29/2024   TRIG 51 01/29/2024   TRIG 57 03/03/2023   TRIG 79 02/10/2022   ALT 12 12/31/2023   ALT 15 06/22/2023   AST 14 12/31/2023   AST 14 06/22/2023      Chemistry      Component Value Date/Time   NA 140 12/31/2023 0838   NA 140 07/10/2012 2133   K 4.2 12/31/2023 0838   K 3.5 07/10/2012 2133   CL 103 12/31/2023 0838   CL 104 07/10/2012 2133   CO2 21 12/31/2023 0838   CO2 29 07/10/2012 2133   BUN 15 12/31/2023 0838   BUN 16 07/10/2012 2133   CREATININE 0.92 12/31/2023 0838   CREATININE 0.66 03/10/2022 0822      Component Value Date/Time   CALCIUM  9.5 12/31/2023 0838   CALCIUM  9.1 07/10/2012 2133   ALKPHOS 93 12/31/2023 0838   ALKPHOS 111 07/10/2012 2133   AST 14 12/31/2023 0838   AST 25 07/10/2012 2133   ALT 12 12/31/2023 9161  ALT 21 07/10/2012 2133   BILITOT 0.4 12/31/2023 0838   BILITOT 0.3 07/10/2012 2133       The ASCVD Risk score (Arnett DK, et al., 2019) failed to calculate for the following reasons:   Risk score cannot be calculated because patient has a medical history suggesting prior/existing ASCVD  Lab Results  Component Value Date   MICRALBCREAT 4 01/29/2024   MICRALBCREAT 7 03/03/2023   MICRALBCREAT 60 (H) 02/10/2022    A/P: Diabetes currently controlled with a most recent A1c of 5.6% on 12/31/23. Patient is able to verbalize appropriate hypoglycemia management plan. Medication adherence appears appropriate. [employer] Nord[employer] will no longer be offering patient assistance for  Medicare beneficiaries for Ozempic  starting in 2026. It appears that the Cardiomyopathy grant has been helping her reach her deductible early in the year. Per pharmacy, last pharmacy copay was in April of 2026. May be able to consider switching from Ozempic  to Mounjaro , as it appears that both are covered on insurance. Patient is interested in additional weight loss and is meeting with the [facility] program. Patient has 4-5 months left of Ozempic  at home.  -Continued GLP-1 Ozempic  (semaglutide ) 2 mg weekly. May discuss Mounjaro  in the future -Patient educated on purpose, proper use, and potential adverse effects of Mounjaro .  -Extensively discussed pathophysiology of diabetes, recommended lifestyle interventions, dietary effects on blood sugar control.  -Counseled on s/sx of and management of hypoglycemia.  -Will MyChart message information for [facility] Medicare specialists with open enrollment  ASCVD risk - primary prevention in patient with diabetes. Last LDL is 28 mg/dL, at goal of <29 mg/dL.  -Continued atorvastatin  10 mg daily.   MISC: contacted [facility] pharmacy to ensure they have been using her [employer] grant information for billing of all medications related to CHF, namely Jardiance  and Eliquis . It appears that she has met her deductible, however, they were not using the grant for billing earlier this year. Provided grant billing information.  Patient verbalized understanding of treatment plan. Total time patient counseling 30 minutes.  Follow-up:  Pharmacist on 05/30/24  Peyton CHARLENA Ferries, PharmD Clinical Pharmacist [facility] [phone]

## 2024-02-11 ENCOUNTER — Other Ambulatory Visit (HOSPITAL_COMMUNITY): Payer: Self-pay

## 2024-02-11 ENCOUNTER — Telehealth: Payer: Self-pay

## 2024-02-11 NOTE — Telephone Encounter (Signed)
 Pharmacy Patient Advocate Encounter  Insurance verification completed.    The patient is insured through [employer]. Patient has Medicare and is not eligible for a copay card, but may be able to apply for patient assistance or Medicare RX Payment Plan (Patient Must reach out to their plan, if eligible for payment plan), if available.    Ran test claim for Ozempic  and the current 30 day co-pay is $0.00.  Ran test claim for Mounjaro  and the current 30 day co-pay is $0.00.  This test claim was processed through [facility]- copay amounts may vary at other pharmacies due to pharmacy/plan contracts, or as the patient moves through the different stages of their insurance plan.

## 2024-02-19 NOTE — Progress Notes (Signed)
 Regina Christensen                                          MRN: [id number]   02/19/2024   The [facility] Quality Team Specialist reviewed this patient medical record for the purposes of chart review for care gap closure. The following were reviewed: abstraction for care gap closure-kidney health evaluation for diabetes:eGFR  and uACR.    [facility]BCI Quality Team

## 2024-03-03 ENCOUNTER — Other Ambulatory Visit: Payer: Self-pay

## 2024-03-03 DIAGNOSIS — I480 Paroxysmal atrial fibrillation: Secondary | ICD-10-CM

## 2024-03-07 NOTE — Telephone Encounter (Signed)
 Prescription refill request for Eliquis  received. Indication: Last office visit: Scr: Age: 68 Weight:; 112kg

## 2024-03-08 MED ORDER — APIXABAN 5 MG PO TABS: 5.0000 mg | ORAL_TABLET | Freq: Two times a day (BID) | ORAL | 1 refills | Status: AC

## 2024-03-09 ENCOUNTER — Other Ambulatory Visit: Payer: Self-pay | Admitting: Family Medicine

## 2024-03-09 ENCOUNTER — Ambulatory Visit (INDEPENDENT_AMBULATORY_CARE_PROVIDER_SITE_OTHER): Payer: Medicare (Managed Care) | Admitting: Adult Health

## 2024-03-09 ENCOUNTER — Encounter (INDEPENDENT_AMBULATORY_CARE_PROVIDER_SITE_OTHER): Payer: Self-pay | Admitting: Adult Health

## 2024-03-09 VITALS — BP 134/78 | HR 80 | Temp 98.1°F | Ht 68.0 in | Wt 248.0 lb

## 2024-03-09 DIAGNOSIS — I152 Hypertension secondary to endocrine disorders: Secondary | ICD-10-CM | POA: Diagnosis not present

## 2024-03-09 DIAGNOSIS — Z7984 Long term (current) use of oral hypoglycemic drugs: Secondary | ICD-10-CM | POA: Diagnosis not present

## 2024-03-09 DIAGNOSIS — E559 Vitamin D deficiency, unspecified: Secondary | ICD-10-CM

## 2024-03-09 DIAGNOSIS — Z6837 Body mass index (BMI) 37.0-37.9, adult: Secondary | ICD-10-CM | POA: Diagnosis not present

## 2024-03-09 DIAGNOSIS — Z7985 Long-term (current) use of injectable non-insulin antidiabetic drugs: Secondary | ICD-10-CM | POA: Diagnosis not present

## 2024-03-09 DIAGNOSIS — R600 Localized edema: Secondary | ICD-10-CM

## 2024-03-09 DIAGNOSIS — E1169 Type 2 diabetes mellitus with other specified complication: Secondary | ICD-10-CM

## 2024-03-09 DIAGNOSIS — E669 Obesity, unspecified: Secondary | ICD-10-CM | POA: Diagnosis not present

## 2024-03-09 DIAGNOSIS — E66813 Obesity, class 3: Secondary | ICD-10-CM

## 2024-03-09 DIAGNOSIS — E1159 Type 2 diabetes mellitus with other circulatory complications: Secondary | ICD-10-CM | POA: Diagnosis not present

## 2024-03-09 MED ORDER — VITAMIN D (ERGOCALCIFEROL) 1.25 MG (50000 UNIT) PO CAPS: 50000.0000 [IU] | ORAL_CAPSULE | ORAL | 0 refills | Status: DC

## 2024-03-09 NOTE — Progress Notes (Signed)
 WEIGHT SUMMARY AND BIOMETRICS  Vitals Temp: 98.1 F (36.7 C) BP: 134/78 Pulse Rate: 80 SpO2: 99 %   Anthropometric Measurements Height: 5' 8 (1.727 m) Weight: 248 lb (112.5 kg) BMI (Calculated): 37.72 Weight at Last Visit: 248lb Weight Lost Since Last Visit: 0lb Weight Gained Since Last Visit: 0lb Starting Weight: 277lb Total Weight Loss (lbs): 29 lb (13.2 kg)   Body Composition  Body Fat %: 47.2 % Fat Mass (lbs): 117.2 lbs Muscle Mass (lbs): 124.4 lbs Total Body Water (lbs): 90.8 lbs Visceral Fat Rating : 15   Other Clinical Data RMR: 1584 Fasting: Yes Labs: No Today's Visit #: 36 Starting Date: 05/10/20    Chief Complaint:   OBESITY Regina Christensen is here to discuss her progress with her obesity treatment plan.  She is on the the Category 1 Plan+100 and states she is following her eating plan approximately 80 % of the time.  She states she is exercising Walking 15 minutes 5 times per week.   Interim History:   02/04/24 08:00  RMR 1584   Cat 1 MP was adjusted with an extra 100 calories to equal 200 snack calories.  Ms. 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 Hypertension associated with type 2 diabetes mellitus (HCC) 08/21/2023 Cards OV Notes: History of Present Illness:     Regina Christensen is a 68 y.o. female with a hx of  hypertension, paroxysmal A. fib s/p ablation 05/2022, CVA 02/2020 (2/2 not taking eliquis ), HFrEF, normalized EF, obesity, sleep apnea who presents for follow-up.   Doing okay from a cardiac perspective, denies palpitations.  Tolerating Eliquis  with no bleeding issues.  Blood pressure is also adequately controlled.  Endorses being sedentary over the past several months leading to some weight gain.  Plans to be more active and also eat healthier to help with weight loss.  Has no concerns today.   Prior notes Echo 02/2022 EF 40 to 45% Echocardiogram 02/2020 EF 55 to 60%, mildly dilated left atrium Did not tolerate Entresto  in the past, cause palpitations.  2. Type 2 diabetes mellitus with other specified complication, without long-term current use of insulin  (HCC) Lab Results  Component Value Date   HGBA1C 5.6 12/31/2023   HGBA1C 5.8 (H) 06/22/2023   HGBA1C 6.0 (H) 02/23/2023    PCP manages weekly Ozempic  2mg  She will administer injection on Wednesday She will develop HA for 24 hrs after injection Denies mass in neck, dysphagia, dyspepsia, persistent hoarseness, abdominal pain, or N/V/C  Cardiology manages daily Jardiance  10mg  Fasting CBG will range from high 97 to 109 She denies sx's of hypoglycemia  3. Vitamin D  deficiency  Latest Reference Range & Units 02/23/23 08:42 06/22/23 08:40 12/31/23 08:38  Vitamin D , 25-Hydroxy 30.0 - 100.0 ng/mL 68.4 30.6 45.3   She is on weekly Ergocalciferol   Assessment/Plan:   1. Hypertension associated  with type 2 diabetes mellitus (HCC) Limit Na+ intake Continue regular walking Continue  Semaglutide , 2 MG/DOSE, (OZEMPIC , 2 MG/DOSE,) 8 MG/3ML SOPN  atorvastatin  (LIPITOR) 10 MG tablet  olmesartan  (BENICAR ) 20 MG tablet  metoprolol  succinate (TOPROL -XL) 50 MG 24 hr tablet  JARDIANCE  10 MG TABS tablet  spironolactone  (ALDACTONE ) 25 MG tablet  furosemide  (LASIX ) 40 MG tablet  apixaban  (ELIQUIS ) 5 MG TABS tablet   2. Type 2 diabetes mellitus with  other specified complication, without long-term current use of insulin  (HCC) (Primary) Discuss replacing Ozempic  2mg  with Mounjaro  with established PCP and Pharm D  3. Vitamin D  deficiency Refill Vitamin D , Ergocalciferol , (DRISDOL ) 1.25 MG (50000 UNIT) CAPS capsule Take 1 capsule (50,000 Units total) by mouth every 7 (seven) days. Dispense: 6 capsule, Refills: 0 ordered   4. Obesity, current BMI 37.7  Regina Christensen is currently in the action stage of change. As such, her goal is to continue with weight loss efforts. She has agreed to the Category 1 Plan.   Exercise goals: Older adults should follow the adult guidelines. When older adults cannot meet the adult guidelines, they should be as physically active as their abilities and conditions will allow.  Older adults should do exercises that maintain or improve balance if they are at risk of falling.  Older adults should determine their level of effort for physical activity relative to their level of fitness.  Older adults with chronic conditions should understand whether and how their conditions affect their ability to do regular physical activity safely.  Behavioral modification strategies: increasing lean protein intake, decreasing simple carbohydrates, increasing vegetables, increasing water intake, decreasing eating out, no skipping meals, meal planning and cooking strategies, keeping healthy foods in the home, ways to avoid boredom eating, and planning for success.  Regina Christensen has agreed to follow-up with our clinic in 4 weeks. She was informed of the importance of frequent follow-up visits to maximize her success with intensive lifestyle modifications for her multiple health conditions.   Objective:   Blood pressure 134/78, pulse 80, temperature 98.1 F (36.7 C), height 5' 8 (1.727 m), weight 248 lb (112.5 kg), SpO2 99%. Body mass index is 37.71 kg/m.  General: Cooperative, alert, well developed, in no acute distress. HEENT: Conjunctivae  and lids unremarkable. Cardiovascular: Regular rhythm.  Lungs: Normal work of breathing. Neurologic: No focal deficits.   Lab Results  Component Value Date   CREATININE 0.92 12/31/2023   BUN 15 12/31/2023   NA 140 12/31/2023   K 4.2 12/31/2023   CL 103 12/31/2023   CO2 21 12/31/2023   Lab Results  Component Value Date   ALT 12 12/31/2023   AST 14 12/31/2023   ALKPHOS 93 12/31/2023   BILITOT 0.4 12/31/2023   Lab Results  Component Value Date   HGBA1C 5.6 12/31/2023   HGBA1C 5.8 (H) 06/22/2023   HGBA1C 6.0 (H) 02/23/2023   HGBA1C 5.4 10/29/2022   HGBA1C 6.1 (A) 06/25/2022   Lab Results  Component Value Date   INSULIN  9.4 12/31/2023   INSULIN  10.1 06/22/2023   INSULIN  6.1 02/23/2023   INSULIN  8.0 08/28/2021   Lab Results  Component Value Date   TSH 1.359 03/02/2022   Lab Results  Component Value Date   CHOL 122 01/29/2024   HDL 81 01/29/2024   LDLCALC 28 01/29/2024   TRIG 51 01/29/2024   CHOLHDL 1.5 01/29/2024   Lab Results  Component Value Date   VD25OH 45.3 12/31/2023   VD25OH 30.6 06/22/2023   VD25OH 68.4 02/23/2023  Lab Results  Component Value Date   WBC 7.0 05/14/2022   HGB 14.6 05/14/2022   HCT 44.9 05/14/2022   MCV 89.6 05/14/2022   PLT 291 05/14/2022   Lab Results  Component Value Date   IRON 39 11/14/2020   TIBC 329 11/14/2020   FERRITIN 25 11/14/2020   Attestation Statements:   Reviewed by clinician on day of visit: allergies, medications, problem list, medical history, surgical history, family history, social history, and previous encounter notes.  I have reviewed the above documentation for accuracy and completeness, and I agree with the above. -  Wilhemina Grall d. Janeshia Ciliberto, NP-C

## 2024-03-11 NOTE — Telephone Encounter (Signed)
 Requested Prescriptions  Pending Prescriptions Disp Refills   potassium chloride  (KLOR-CON  M) 10 MEQ tablet [Pharmacy Med Name: POTASSIUM CHL MICRO T 10 Tablet] 90 tablet 0    Sig: TAKE 1 TABLET BY MOUTH ONCE DAILY     Endocrinology:  Minerals - Potassium Supplementation Passed - 03/11/2024  2:56 PM      Passed - K in normal range and within 360 days    Potassium  Date Value Ref Range Status  12/31/2023 4.2 3.5 - 5.2 mmol/L Final  07/10/2012 3.5 3.5 - 5.1 mmol/L Final         Passed - Cr in normal range and within 360 days    Creat  Date Value Ref Range Status  03/10/2022 0.66 0.50 - 1.05 mg/dL Final   Creatinine, Ser  Date Value Ref Range Status  12/31/2023 0.92 0.57 - 1.00 mg/dL Final   Creatinine, Urine  Date Value Ref Range Status  01/29/2024 269 20 - 275 mg/dL Final         Passed - Valid encounter within last 12 months    Recent Outpatient Visits           1 month ago Controlled type 2 diabetes mellitus with microalbuminuria, without long-term current use of insulin  [facility])   [facility] Glenard Mire, MD   6 months ago Acute non-recurrent frontal sinusitis   [facility] Gareth Mliss FALCON, FNP   8 months ago Chronic systolic heart failure [facility])   [facility] Glenard Mire, MD       Future Appointments             In 5 months McGowan, Clotilda DELENA RIGGERS [facility] [place]

## 2024-04-07 ENCOUNTER — Ambulatory Visit (INDEPENDENT_AMBULATORY_CARE_PROVIDER_SITE_OTHER): Payer: Medicare (Managed Care) | Admitting: Adult Health

## 2024-04-07 VITALS — BP 127/80 | HR 74 | Temp 98.1°F | Ht 68.0 in | Wt 251.0 lb

## 2024-04-07 DIAGNOSIS — E559 Vitamin D deficiency, unspecified: Secondary | ICD-10-CM

## 2024-04-07 DIAGNOSIS — E669 Obesity, unspecified: Secondary | ICD-10-CM | POA: Diagnosis not present

## 2024-04-07 DIAGNOSIS — Z6841 Body Mass Index (BMI) 40.0 and over, adult: Secondary | ICD-10-CM

## 2024-04-07 DIAGNOSIS — I10 Essential (primary) hypertension: Secondary | ICD-10-CM

## 2024-04-07 DIAGNOSIS — I152 Hypertension secondary to endocrine disorders: Secondary | ICD-10-CM

## 2024-04-07 DIAGNOSIS — E1169 Type 2 diabetes mellitus with other specified complication: Secondary | ICD-10-CM | POA: Diagnosis not present

## 2024-04-07 DIAGNOSIS — Z7984 Long term (current) use of oral hypoglycemic drugs: Secondary | ICD-10-CM

## 2024-04-07 DIAGNOSIS — E1159 Type 2 diabetes mellitus with other circulatory complications: Secondary | ICD-10-CM

## 2024-04-07 DIAGNOSIS — Z6838 Body mass index (BMI) 38.0-38.9, adult: Secondary | ICD-10-CM | POA: Diagnosis not present

## 2024-04-07 DIAGNOSIS — Z7985 Long-term (current) use of injectable non-insulin antidiabetic drugs: Secondary | ICD-10-CM

## 2024-04-07 NOTE — Progress Notes (Signed)
 WEIGHT SUMMARY AND BIOMETRICS  Vitals Temp: 98.1 F (36.7 C) BP: 127/80 Pulse Rate: 74 SpO2: 99 %   Anthropometric Measurements Height: 5' 8 (1.727 m) Weight: 251 lb (113.9 kg) BMI (Calculated): 38.17 Weight at Last Visit: 248lb Weight Lost Since Last Visit: 0lb Weight Gained Since Last Visit: 3lb Starting Weight: 277lb Total Weight Loss (lbs): 26 lb (11.8 kg)   Body Composition  Body Fat %: 46.6 % Fat Mass (lbs): 117 lbs Muscle Mass (lbs): 127.2 lbs Total Body Water (lbs): 91.8 lbs Visceral Fat Rating : 15   Other Clinical Data RMR: 1584 Fasting: Yes Labs: No Today's Visit #: 37 Starting Date: 05/10/20    Chief Complaint:   OBESITY Regina Christensen is here to discuss her progress with her obesity treatment plan.  She is on the the Category 1 Plan and states she is following her eating plan approximately 70 % of the time.  She states she is exercising Seated Exercises   Interim History:  4 weeks in between OV at [facility] The first 2 weeks she will follow MP consistently and exercise. Then then following 2 weeks she will relax eating habits and stop seated exercises. Discussed strategies to improve consistency with healthy habits.  Subjective:   1. Hypertension associated with type 2 diabetes mellitus (HCC) BP excellent at OV She denies CP with exertion  2. Type 2 diabetes mellitus with other specified complication, without long-term current use of insulin  (HCC) Fasting labs between mid 90s and low 120s She will experience sx's of hypoglycemia 1-3 times per month. She will consume a simple CHO and sx's will resolve PCP is managing weekly Ozempic  2mg  Cards is managing daily Jardiance  10mg   3. Vitamin D  deficiency  Latest Reference Range & Units 02/23/23 08:42 06/22/23 08:40 12/31/23 08:38  Vitamin D , 25-Hydroxy 30.0 - 100.0 ng/mL 68.4 30.6 45.3   Vit D level stable  Assessment/Plan:   1. Hypertension associated with type 2 diabetes mellitus (HCC)  (Primary) Limit Na+ Increase daily activity/movement Continue  Semaglutide , 2 MG/DOSE, (OZEMPIC , 2 MG/DOSE,) 8 MG/3ML SOPN  atorvastatin  (LIPITOR) 10 MG tablet  olmesartan  (BENICAR ) 20 MG tablet  metoprolol  succinate (TOPROL -XL) 50 MG 24 hr tablet  JARDIANCE  10 MG TABS tablet  spironolactone  (ALDACTONE ) 25 MG tablet  furosemide  (LASIX ) 40 MG tablet  apixaban  (ELIQUIS ) 5 MG TABS tablet   2. Type 2 diabetes mellitus with other specified complication, without long-term current use of insulin  (HCC) Discuss Ozempic  therapy with PCP at chronic f/u next month Consider converting to Mounjaro  for improved weight loss results.  3. Vitamin D  deficiency Monitor Labs  4. Obesity, current BMI 38.2  Regina Christensen is currently in the action stage of change. As such, her goal is to continue with weight loss efforts. She has agreed to the Category 1 Plan.   Exercise goals: Older adults should follow the adult guidelines. When older adults cannot meet the adult guidelines, they should be as physically active as their abilities and conditions will allow.  Older adults should do exercises that maintain or improve balance if they are at risk of falling.  Older adults should determine their level of effort for physical activity relative to their level of fitness.  Older adults with chronic conditions should understand whether and how their conditions affect their ability to do regular physical activity safely. Try to perform seated exercises every other day until next OV at [facility].  Behavioral modification strategies: increasing lean protein intake, decreasing simple carbohydrates, increasing vegetables, increasing water intake,  no skipping meals, meal planning and cooking strategies, keeping healthy foods in the home, ways to avoid boredom eating, and planning for success.  Regina Christensen has agreed to follow-up with our clinic in 4 weeks. She was informed of the importance of frequent follow-up visits to maximize her  success with intensive lifestyle modifications for her multiple health conditions.   Check Fasting Labs at next OV  Objective:   Blood pressure 127/80, pulse 74, temperature 98.1 F (36.7 C), height 5' 8 (1.727 m), weight 251 lb (113.9 kg), SpO2 99%. Body mass index is 38.16 kg/m.  General: Cooperative, alert, well developed, in no acute distress. HEENT: Conjunctivae and lids unremarkable. Cardiovascular: Regular rhythm.  Lungs: Normal work of breathing. Neurologic: No focal deficits.   Lab Results  Component Value Date   CREATININE 0.92 12/31/2023   BUN 15 12/31/2023   NA 140 12/31/2023   K 4.2 12/31/2023   CL 103 12/31/2023   CO2 21 12/31/2023   Lab Results  Component Value Date   ALT 12 12/31/2023   AST 14 12/31/2023   ALKPHOS 93 12/31/2023   BILITOT 0.4 12/31/2023   Lab Results  Component Value Date   HGBA1C 5.6 12/31/2023   HGBA1C 5.8 (H) 06/22/2023   HGBA1C 6.0 (H) 02/23/2023   HGBA1C 5.4 10/29/2022   HGBA1C 6.1 (A) 06/25/2022   Lab Results  Component Value Date   INSULIN  9.4 12/31/2023   INSULIN  10.1 06/22/2023   INSULIN  6.1 02/23/2023   INSULIN  8.0 08/28/2021   Lab Results  Component Value Date   TSH 1.359 03/02/2022   Lab Results  Component Value Date   CHOL 122 01/29/2024   HDL 81 01/29/2024   LDLCALC 28 01/29/2024   TRIG 51 01/29/2024   CHOLHDL 1.5 01/29/2024   Lab Results  Component Value Date   VD25OH 45.3 12/31/2023   VD25OH 30.6 06/22/2023   VD25OH 68.4 02/23/2023   Lab Results  Component Value Date   WBC 7.0 05/14/2022   HGB 14.6 05/14/2022   HCT 44.9 05/14/2022   MCV 89.6 05/14/2022   PLT 291 05/14/2022   Lab Results  Component Value Date   IRON 39 11/14/2020   TIBC 329 11/14/2020   FERRITIN 25 11/14/2020   Attestation Statements:   Reviewed by clinician on day of visit: allergies, medications, problem list, medical history, surgical history, family history, social history, and previous encounter notes.  I have  reviewed the above documentation for accuracy and completeness, and I agree with the above. -  Zylie Mumaw d. Yocelin Vanlue, NP-C

## 2024-04-12 ENCOUNTER — Encounter: Payer: Self-pay | Admitting: Family Medicine

## 2024-04-15 ENCOUNTER — Encounter: Payer: Self-pay | Admitting: Family Medicine

## 2024-04-15 ENCOUNTER — Ambulatory Visit: Payer: Medicare (Managed Care) | Admitting: Family Medicine

## 2024-04-15 VITALS — BP 128/70 | HR 78 | Temp 98.0°F | Resp 16 | Ht 68.0 in | Wt 251.0 lb

## 2024-04-15 DIAGNOSIS — J0100 Acute maxillary sinusitis, unspecified: Secondary | ICD-10-CM

## 2024-04-15 MED ORDER — AZITHROMYCIN 250 MG PO TABS: ORAL_TABLET | ORAL | 0 refills | Status: AC

## 2024-04-15 MED ORDER — METHYLPREDNISOLONE 4 MG PO TBPK: ORAL_TABLET | ORAL | 0 refills | Status: DC

## 2024-04-15 NOTE — Progress Notes (Signed)
 "  Acute Office Visit  Subjective:     Patient ID: Regina Christensen, female    DOB: [birth year], 68 y.o.   MRN: [id number]  Chief Complaint  Patient presents with   Sinusitis    On/off for years    HPI Patient is in today for complaints of sinus pain, ear pain, and cough. She reports from coughing that her chest is sore. She reports cough is dry but feels congested. She voices she feels that there is something to cough up but cannot produce phlegm. She voices symptoms started earlier this week, she believes on Monday. She voices initially she had a mild headache but this has resolved.  She voices she has been taking Allegra and using her nose spray as prescribed, and states this has not seemed to help. She reports she has symptoms similar anywhere from 2 to 4 times a year. She voices in the past when she has had similar symptoms she has been given an antibiotic, Azithromycin , and a steroid that she tolerated well and resolved condition. She reports she has seen ENT in the past, she estimates a few years ago. She reports prior sinus surgery.   Review of Systems  Constitutional:  Positive for malaise/fatigue. Negative for fever.  HENT:  Positive for congestion, ear pain and sinus pain. Negative for sore throat.   Respiratory:  Positive for cough. Negative for shortness of breath.   Neurological:  Negative for headaches.        Objective:    BP 128/70   Pulse 78   Temp 98 F (36.7 C)   Resp 16   Ht 5' 8 (1.727 m)   Wt 251 lb (113.9 kg)   SpO2 99%   BMI 38.16 kg/m    Physical Exam Constitutional:      Appearance: Normal appearance.  HENT:     Head: Normocephalic and atraumatic.     Right Ear: Tympanic membrane and ear canal normal.     Left Ear: Tympanic membrane and ear canal normal.     Nose:     Right Sinus: No maxillary sinus tenderness or frontal sinus tenderness.     Left Sinus: Maxillary sinus tenderness present. No frontal sinus tenderness.     Mouth/Throat:      Pharynx: Oropharynx is clear. No oropharyngeal exudate or posterior oropharyngeal erythema.  Cardiovascular:     Rate and Rhythm: Normal rate and regular rhythm.  Pulmonary:     Effort: Pulmonary effort is normal. No respiratory distress.     Breath sounds: Normal breath sounds. No wheezing, rhonchi or rales.  Skin:    General: Skin is warm and dry.  Neurological:     General: No focal deficit present.     Mental Status: She is alert.  Psychiatric:        Mood and Affect: Mood normal.        Behavior: Behavior normal.        Assessment & Plan:   Assessment & Plan Acute maxillary sinusitis, recurrence not specified Patient presents with complaints of sinus congestion, sinus pain, ear pain and cough. She reports similar symptoms usually occurring two to four times yearly. As per HPI she reports she has been taking prescribed Allegra and using Nasacort  nasal spray without relief of symptoms.   V/s stable. Physical exam findings pertinent for left sided maxillary sinus tenderness. Chest is clear to auscultation. No lymphadenopathy present. Bilateral TM clear to visualization without abnormal findings.   -Recommended use of OTC  Guaifenesin  as needed for sinus congestion and cough. -Azithromycin  and Medrol  dose pack provided for treatment of acute sinusitis.  -Due to hx of sinus surgery and episodes multiple times yearly recommended that she return to ENT for follow up and re-evaluation. She declines at this time but states she will think about this.  - Advised to maintain scheduled follow up with PCP on 05/20/24. She is agreeable.  Orders:   azithromycin  (ZITHROMAX ) 250 MG tablet; Take 2 tablets on day 1, then 1 tablet daily on days 2 through 5   methylPREDNISolone  (MEDROL  DOSEPAK) 4 MG TBPK tablet; Day 1: Take 8 mg (2 tablets) before breakfast, 4 mg (1 tablet) after lunch, 4 mg (1 tablet) after supper, and 8 mg (2 tablets) at bedtime. Day 2:Take 4 mg (1 tablet) before breakfast, 4 mg  (1 tablet) after lunch, 4 mg (1 tablet) after supper, and 8 mg (2 tablets) at bedtime. Day 3: Take 4 mg (1 tablet) before breakfast, 4 mg (1 tablet) after lunch, 4 mg (1 tablet) after supper, and 4 mg (1 tablet) at bedtime. Day 4: Take 4 mg (1 tablet) before breakfast, 4 mg (1 tablet) after lunch, and 4 mg (1 tablet) at bedtime. Day 5: Take 4 mg (1 tablet) before breakfast and 4 mg (1 tablet) at bedtime. Day 6: Take 4 mg (1 tablet) before breakfast.      Meds ordered this encounter  Medications   azithromycin  (ZITHROMAX ) 250 MG tablet    Sig: Take 2 tablets on day 1, then 1 tablet daily on days 2 through 5    Dispense:  6 tablet    Refill:  0   methylPREDNISolone  (MEDROL  DOSEPAK) 4 MG TBPK tablet    Sig: Day 1: Take 8 mg (2 tablets) before breakfast, 4 mg (1 tablet) after lunch, 4 mg (1 tablet) after supper, and 8 mg (2 tablets) at bedtime. Day 2:Take 4 mg (1 tablet) before breakfast, 4 mg (1 tablet) after lunch, 4 mg (1 tablet) after supper, and 8 mg (2 tablets) at bedtime. Day 3: Take 4 mg (1 tablet) before breakfast, 4 mg (1 tablet) after lunch, 4 mg (1 tablet) after supper, and 4 mg (1 tablet) at bedtime. Day 4: Take 4 mg (1 tablet) before breakfast, 4 mg (1 tablet) after lunch, and 4 mg (1 tablet) at bedtime. Day 5: Take 4 mg (1 tablet) before breakfast and 4 mg (1 tablet) at bedtime. Day 6: Take 4 mg (1 tablet) before breakfast.    Dispense:  1 each    Refill:  0    Return if symptoms worsen or fail to improve.  LAYMON LOISE CORE, FNP   "

## 2024-05-03 ENCOUNTER — Ambulatory Visit: Payer: Self-pay

## 2024-05-03 ENCOUNTER — Ambulatory Visit
Admission: EM | Admit: 2024-05-03 | Discharge: 2024-05-03 | Disposition: A | Discharge status: Home / Self Care | Payer: Medicare (Managed Care) | Attending: Emergency Medicine | Admitting: Emergency Medicine

## 2024-05-03 DIAGNOSIS — S76311A Strain of muscle, fascia and tendon of the posterior muscle group at thigh level, right thigh, initial encounter: Secondary | ICD-10-CM | POA: Diagnosis not present

## 2024-05-03 DIAGNOSIS — J0100 Acute maxillary sinusitis, unspecified: Secondary | ICD-10-CM

## 2024-05-03 MED ORDER — METHYLPREDNISOLONE SODIUM SUCC 125 MG IJ SOLR
60.0000 mg | Freq: Once | INTRAMUSCULAR | Status: AC
  Administered 2024-05-03: 60 mg via INTRAMUSCULAR

## 2024-05-03 MED ORDER — PREDNISONE 10 MG (21) PO TBPK: ORAL_TABLET | Freq: Every day | ORAL | 0 refills | Status: DC

## 2024-05-03 MED ORDER — METHOCARBAMOL 500 MG PO TABS: 500.0000 mg | ORAL_TABLET | Freq: Every evening | ORAL | 0 refills | Status: DC | PRN

## 2024-05-03 NOTE — Discharge Instructions (Addendum)
 Your pain is most likely caused by irritation to the muscle.  Based on placement and examination low suspicion for blood clot at this time  You have been given an injection of methylprednisolone  to help reduce inflammation and help with pain and ideally should start to see improvement within the next 30 minutes to an hour  Starting tomorrow take oral prednisone  as directed to continue the above process, may take Tylenol  or any topical medicines additionally, we will temporarily increase your blood sugars therefore monitor closely, will go back to normal once treatment is completed  You may use heating pad in 15 minute intervals as needed for additional comfort,  Begin stretching affected area daily for 10 minutes as tolerated to further loosen muscles, stretches within packet  When lying down place pillow underneath and between knees for support  If pain persist after recommended treatment or reoccurs if may be beneficial to follow up with orthopedic specialist for evaluation, this doctor specializes in the bones and can manage your symptoms long-term with options such as but not limited to imaging, medications or physical therapy

## 2024-05-03 NOTE — ED Provider Notes (Signed)
 " CAY RALPH PELT    CSN: [id number] Arrival date & time: 05/03/24  1740      History   Chief Complaint Chief Complaint  Patient presents with   Leg Pain    HPI Regina Christensen is a 69 y.o. female.   Patient presents for evaluation of posterior right thigh pain for 3 days. Started after attempting to get out of a car, says that it begin to spasm and  caught causing her to sit down immediately.  Has been constant exacerbated  by bending.  Radiates to the posterior calf, described as a soreness.  Able to bear weight and complete range of motion. History of blood clots, has attempted tylenol  arthritis.      Past Medical History:  Diagnosis Date   Anxiety    Arthritis    Back pain    BPPV (benign paroxysmal positional vertigo)    CHF (congestive heart failure) (HCC)    Chronic diastolic HF (heart failure) (HCC)    Complication of anesthesia    Post-operative hypoxia requiring supplemental oxygen   Current use of long term anticoagulation    Apixaban    CVA (cerebral vascular accident) (HCC) 02/28/2020   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; imaging from [facility]   Depression    Edema of both lower extremities    GERD (gastroesophageal reflux disease)    Hypertension    IBS (irritable bowel syndrome)    Lactose intolerance    Morbid obesity with BMI of 45.0-49.9, adult (HCC)    Obesity    Osteoarthritis    PAF (paroxysmal atrial fibrillation) (HCC)    Pre-diabetes    Sleep apnea    uses CPAP machine sometimes    Type 2 diabetes mellitus without complication    Vitamin D  deficiency     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   Hypercoagulable state due to persistent atrial fibrillation (HCC) 06/26/2022   Abnormal CT of  the abdomen 05/06/2022   Primary insomnia 04/25/2022   Chronic combined systolic and diastolic heart failure (HCC) 03/06/2022   BMI 36.0-36.9,adult 03/04/2022   HFrEF (heart failure with reduced ejection fraction) (HCC) 03/03/2022   Primary hypertension 03/03/2022   Atrial fibrillation with RVR (HCC) 03/02/2022   Dilated cardiomyopathy (HCC) 03/02/2022   Nonrheumatic tricuspid valve regurgitation 03/02/2022   Osteopenia of necks of both femurs 01/22/2022   History of total bilateral knee replacement 09/10/2021   History of blood transfusion 09/10/2020   Current use of long term anticoagulation    Diabetes mellitus (HCC)    GERD without esophagitis    Vitamin D  deficiency 04/12/2020   History of CVA (cerebrovascular accident) without residual deficits 03/05/2020   History of hysterectomy 07/19/2019   MDD (major depressive disorder), recurrent, in full remission 07/19/2019   Insomnia due to mental condition 07/19/2019   Bilateral carpal tunnel syndrome 07/30/2016   OSA (obstructive sleep apnea) 03/07/2016   BPPV (benign paroxysmal positional vertigo) 08/28/2015   Hypertension associated with type 2 diabetes mellitus (HCC) 11/09/2014   GAD (generalized anxiety disorder) 11/09/2014   Acanthosis nigricans 11/09/2014   Morbid obesity (HCC) 01/06/2012    Past Surgical History:  Procedure Laterality Date   ABDOMINAL HYSTERECTOMY     ATRIAL FIBRILLATION ABLATION N/A 05/29/2022  Procedure: ATRIAL FIBRILLATION ABLATION;  Surgeon: Cindie Ole DASEN, MD;  Location: [facility] INVASIVE CV LAB;  Service: Cardiovascular;  Laterality: N/A;   BIOPSY  05/21/2023   Procedure: BIOPSY;  Surgeon: Jinny Carmine, MD;  Location: [facility] ENDOSCOPY;  Service: Endoscopy;;   BREAST EXCISIONAL BIOPSY Left    CARDIOVERSION N/A 03/05/2022   Procedure: CARDIOVERSION;  Surgeon: Darliss Rogue, MD;  Location: [facility] ORS;  Service: Cardiovascular;  Laterality: N/A;   COLONOSCOPY WITH PROPOFOL  N/A 05/21/2023   Procedure:  COLONOSCOPY WITH PROPOFOL ;  Surgeon: Jinny Carmine, MD;  Location: [facility] ENDOSCOPY;  Service: Endoscopy;  Laterality: N/A;   ENDOMETRIAL ABLATION     x2   ENDOSCOPIC CONCHA BULLOSA RESECTION Bilateral 08/08/2020   Procedure: ENDOSCOPIC CONCHA BULLOSA RESECTION;  Surgeon: Edda Mt, MD;  Location: [facility] ORS;  Service: ENT;  Laterality: Bilateral;   ETHMOIDECTOMY Right 08/08/2020   Procedure: ETHMOIDECTOMY, LEFT PARTIAL ETHMOIDECTOMY;  Surgeon: Edda Mt, MD;  Location: [facility] ORS;  Service: ENT;  Laterality: Right;   IMAGE GUIDED SINUS SURGERY N/A 08/08/2020   Procedure: IMAGE GUIDED SINUS SURGERY, RIGHT FRONTAL SINUSOTOMY;  Surgeon: Edda Mt, MD;  Location: [facility] ORS;  Service: ENT;  Laterality: N/A;   LAPAROSCOPIC SLEEVE GASTRECTOMY     MAXILLARY ANTROSTOMY Bilateral 08/08/2020   Procedure: MAXILLARY ANTROSTOMY with tissue;  Surgeon: Edda Mt, MD;  Location: [facility] ORS;  Service: ENT;  Laterality: Bilateral;   NASAL TURBINATE REDUCTION  08/27/2020   Procedure: TURBINATE REDUCTION /SUBMUCOSAL DEBRIDEMENT;  Surgeon: Edda Mt, MD;  Location: [facility] ORS;  Service: ENT;;   TOTAL KNEE ARTHROPLASTY Right 06/2019   TOTAL KNEE ARTHROPLASTY Left 01/31/2021   [facility]    OB History     Gravida  2   Para  2   Term      Preterm      AB      Living         SAB      IAB      Ectopic      Multiple      Live Births               Home Medications    Prior to Admission medications  Medication Sig Start Date End Date Taking? Authorizing Provider  apixaban  (ELIQUIS ) 5 MG TABS tablet Take 1 tablet (5 mg total) by mouth 2 (two) times daily. 03/08/24   Darliss Rogue, MD  atorvastatin  (LIPITOR) 10 MG tablet TAKE 1 TABLET BY MOUTH ONCE DAILY 06/17/23   Sowles, Krichna, MD  Blood Pressure KIT 1 Units by Does not apply route daily. 01/26/23   Danford, Rockie D, NP  famotidine  (PEPCID ) 20 MG tablet TAKE 1 TABLET BY MOUTH DAILY AS NEEDED FOR  INDIGESTION 06/17/23   Sowles, Krichna, MD  fexofenadine (ALLEGRA) 180 MG tablet Take 180 mg by mouth daily as needed.    [provider]  furosemide  (LASIX ) 40 MG tablet Take 1 tablet (40 mg total) by mouth daily. 12/30/23   Sowles, Krichna, MD  gabapentin  (NEURONTIN ) 300 MG capsule Take 300 mg by mouth 3 (three) times daily as needed (for pain).    [provider]  JARDIANCE  10 MG TABS tablet TAKE 1 TABLET BY MOUTH DAILY BEFORE BREAKFAST 12/09/23   Darliss Rogue, MD  meclizine  (ANTIVERT ) 12.5 MG tablet Take 1 tablet (12.5 mg total) by mouth 3 (three) times daily as needed for dizziness. 06/25/22   Sowles, Krichna, MD  methylPREDNISolone  (MEDROL  DOSEPAK) 4 MG TBPK tablet Day 1: Take 8  mg (2 tablets) before breakfast, 4 mg (1 tablet) after lunch, 4 mg (1 tablet) after supper, and 8 mg (2 tablets) at bedtime. Day 2:Take 4 mg (1 tablet) before breakfast, 4 mg (1 tablet) after lunch, 4 mg (1 tablet) after supper, and 8 mg (2 tablets) at bedtime. Day 3: Take 4 mg (1 tablet) before breakfast, 4 mg (1 tablet) after lunch, 4 mg (1 tablet) after supper, and 4 mg (1 tablet) at bedtime. Day 4: Take 4 mg (1 tablet) before breakfast, 4 mg (1 tablet) after lunch, and 4 mg (1 tablet) at bedtime. Day 5: Take 4 mg (1 tablet) before breakfast and 4 mg (1 tablet) at bedtime. Day 6: Take 4 mg (1 tablet) before breakfast. 04/15/24   Wall, Laymon SAILOR, FNP  metoprolol  succinate (TOPROL -XL) 50 MG 24 hr tablet Take 1.5 tablets (75 mg total) by mouth 2 (two) times daily. 11/05/23   Cindie Ole DASEN, MD  Multiple Vitamin (MULTIVITAMIN) tablet Take 1 tablet by mouth daily. BariatricPal Multivitamin    [provider]  olmesartan  (BENICAR ) 20 MG tablet Take 1 tablet (20 mg total) by mouth daily. 09/04/23   Darliss Rogue, MD  pantoprazole  (PROTONIX ) 40 MG tablet TAKE 1 TABLET BY MOUTH ONCE DAILY 06/17/23   Sowles, Krichna, MD  potassium chloride  (KLOR-CON  M) 10 MEQ tablet TAKE 1 TABLET BY MOUTH ONCE  DAILY 03/11/24   Sowles, Krichna, MD  Semaglutide , 2 MG/DOSE, (OZEMPIC , 2 MG/DOSE,) 8 MG/3ML SOPN INJECT 2MG  SUBCUTANEOUSLY ONCE WEEKLY 12/23/22   Sowles, Krichna, MD  spironolactone  (ALDACTONE ) 25 MG tablet TAKE 1/2 TABLET BY MOUTH ONCE DAILY 12/09/23   Darliss Rogue, MD  triamcinolone  (NASACORT ) 55 MCG/ACT AERO nasal inhaler Place 2 sprays into the nose daily.    [provider]  triamcinolone  cream (KENALOG ) 0.1 % Apply 1 Application topically 2 (two) times daily as needed. 09/28/23   Sowles, Krichna, MD  venlafaxine  XR (EFFEXOR -XR) 75 MG 24 hr capsule Take 1 capsule (75 mg total) by mouth every morning. 07/06/23   Sowles, Krichna, MD  Vitamin D , Ergocalciferol , (DRISDOL ) 1.25 MG (50000 UNIT) CAPS capsule Take 1 capsule (50,000 Units total) by mouth every 7 (seven) days. 03/09/24   Danford, Rockie D, NP  XIIDRA  5 % SOLN Apply 1 drop to eye 2 (two) times daily. 08/07/23   [provider]    Family History Family History  Problem Relation Age of Onset   Diabetes Mother    High blood pressure Mother    Heart disease Father    Stroke Sister    Cancer Brother    Mental illness Neg Hx     Social History Social History[1]   Allergies   Hydrocodone -acetaminophen , Ace inhibitors, and Hctz [hydrochlorothiazide ]   Review of Systems Review of Systems   Physical Exam Triage Vital Signs ED Triage Vitals [05/03/24 1811]  Encounter Vitals Group     BP (!) 164/80     Girls Systolic BP Percentile      Girls Diastolic BP Percentile      Boys Systolic BP Percentile      Boys Diastolic BP Percentile      Pulse Rate 73     Resp 17     Temp 98.2 F (36.8 C)     Temp Source Oral     SpO2 96 %     Weight      Height      Head Circumference      Peak Flow      Pain  Score 7     Pain Loc      Pain Education      Exclude from Growth Chart    No data found.  Updated Vital Signs BP (!) 164/80 (BP Location: Right Arm)   Pulse 73   Temp 98.2 F (36.8 C) (Oral)   Resp  17   SpO2 96%   Visual Acuity Right Eye Distance:   Left Eye Distance:   Bilateral Distance:    Right Eye Near:   Left Eye Near:    Bilateral Near:     Physical Exam Constitutional:      Appearance: Normal appearance.  Eyes:     Extraocular Movements: Extraocular movements intact.  Pulmonary:     Effort: Pulmonary effort is normal.  Musculoskeletal:       Legs:     Comments: Point tenderness to the posterior right thigh, no ecchymosis swelling or deformity, able to bear weight  Neurological:     Mental Status: She is alert and oriented to person, place, and time. Mental status is at baseline.      UC Treatments / Results  Labs (all labs ordered are listed, but only abnormal results are displayed) Labs Reviewed - No data to display  EKG   Radiology No results found.  Procedures Procedures (including critical care time)  Medications Ordered in UC Medications - No data to display  Initial Impression / Assessment and Plan / UC Course  I have reviewed the triage vital signs and the nursing notes.  Pertinent labs & imaging results that were available during my care of the patient were reviewed by me and considered in my medical decision making (see chart for details).  Strain of the right hamstring muscle, initial encounter  Point tenderness noted to the posterior thigh etiology most likely muscular low suspicion for thrombosis, discussed this with patient, methylprednisolone  IM given and prescribed prednisone  and Robaxin  for home use, given written handout of stretches to complete as well as recommended supportive care through [facility] advised follow-up with primary care for further evaluation as needed Final Clinical Impressions(s) / UC Diagnoses   Final diagnoses:  None   Discharge Instructions   None    ED Prescriptions   None    PDMP not reviewed this encounter.     [1]  Social History Tobacco Use   Smoking status: Never   Smokeless tobacco: Never   Vaping Use   Vaping status: Never Used  Substance Use Topics   Alcohol use: No    Alcohol/week: 0.0 standard drinks of alcohol    Comment: rare   Drug use: No     Teresa Shelba SAUNDERS, NP 05/03/24 1850  "

## 2024-05-03 NOTE — ED Triage Notes (Signed)
 Right leg pain along the posterior side onset this past Saturday afternoon. Tried to make an appointment with her PCP but was told she would need to be seen soon in person to rule out blood clots.   Denies any falls or injuries. States she turned to the left while getting out the car and then felt the leg lock up.

## 2024-05-03 NOTE — Telephone Encounter (Signed)
 FYI Only or Action Required?:  Pt will go to UC this evening. Pt has hx of blood clots      Patient was last seen in primary care on 04/15/2024 by Wall, Laymon SAILOR, FNP.  Called Nurse Triage reporting Leg Pain. - 1 leg upper thigh back side.  Symptoms began several days ago.  Interventions attempted: Tylenol .  Symptoms are: unchanged.  Triage Disposition: See HCP Within 4 Hours (Or PCP Triage)  Patient/caregiver understands and will follow disposition?: Yes                       Copied from CRM [id number]. Topic: Clinical - Red Word Triage >> May 03, 2024  4:52 PM Alfonso ORN wrote: Red Word that prompted transfer to Nurse Triage: muscle in leg : pain and tightness , muscle spasm worsening Reason for Disposition  [1] Thigh or calf pain AND [2] only 1 side AND [3] present > 1 hour  (Exception: Chronic unchanged pain.)  Answer Assessment - Initial Assessment Questions 1. ONSET: When did the pain start?      Saturday afternoon 2. LOCATION: Where is the pain located?      Right upper thigh under buttocks 3. PAIN: How bad is the pain?    (Scale 1-10; or mild, moderate, severe)     7-8/10 4. WORK OR EXERCISE: Has there been any recent work or exercise that involved this part of the body?      No - getting out of car 5. CAUSE: What do you think is causing the leg pain?     Constant - might be muscle strain 6. OTHER SYMPTOMS: Do you have any other symptoms? (e.g., chest pain, back pain, breathing difficulty, swelling, rash, fever, numbness, weakness)     no  Protocols used: Leg Pain-A-AH

## 2024-05-04 ENCOUNTER — Ambulatory Visit (INDEPENDENT_AMBULATORY_CARE_PROVIDER_SITE_OTHER): Payer: Medicare (Managed Care) | Admitting: Adult Health

## 2024-05-04 ENCOUNTER — Encounter (INDEPENDENT_AMBULATORY_CARE_PROVIDER_SITE_OTHER): Payer: Self-pay | Admitting: Adult Health

## 2024-05-04 VITALS — BP 138/89 | HR 80 | Temp 97.7°F | Ht 68.0 in | Wt 253.0 lb

## 2024-05-04 DIAGNOSIS — E66813 Obesity, class 3: Secondary | ICD-10-CM

## 2024-05-04 DIAGNOSIS — Z7984 Long term (current) use of oral hypoglycemic drugs: Secondary | ICD-10-CM | POA: Diagnosis not present

## 2024-05-04 DIAGNOSIS — E669 Obesity, unspecified: Secondary | ICD-10-CM

## 2024-05-04 DIAGNOSIS — E559 Vitamin D deficiency, unspecified: Secondary | ICD-10-CM

## 2024-05-04 DIAGNOSIS — E1169 Type 2 diabetes mellitus with other specified complication: Secondary | ICD-10-CM

## 2024-05-04 DIAGNOSIS — Z7985 Long-term (current) use of injectable non-insulin antidiabetic drugs: Secondary | ICD-10-CM | POA: Diagnosis not present

## 2024-05-04 DIAGNOSIS — G4733 Obstructive sleep apnea (adult) (pediatric): Secondary | ICD-10-CM

## 2024-05-04 DIAGNOSIS — I10 Essential (primary) hypertension: Secondary | ICD-10-CM

## 2024-05-04 DIAGNOSIS — E1159 Type 2 diabetes mellitus with other circulatory complications: Secondary | ICD-10-CM | POA: Diagnosis not present

## 2024-05-04 DIAGNOSIS — Z6838 Body mass index (BMI) 38.0-38.9, adult: Secondary | ICD-10-CM | POA: Diagnosis not present

## 2024-05-04 DIAGNOSIS — I152 Hypertension secondary to endocrine disorders: Secondary | ICD-10-CM

## 2024-05-04 NOTE — Progress Notes (Signed)
 "    WEIGHT SUMMARY AND BIOMETRICS  Vitals Temp: 97.7 F (36.5 C) BP: 138/89 Pulse Rate: 80 SpO2: 100 %   Anthropometric Measurements Height: 5' 8 (1.727 m) Weight: 253 lb (114.8 kg) BMI (Calculated): 38.48 Weight at Last Visit: 251lb Weight Lost Since Last Visit: 0lb Weight Gained Since Last Visit: 2lb Starting Weight: 277lb Total Weight Loss (lbs): 24 lb (10.9 kg)   Body Composition  Body Fat %: 48.6 % Fat Mass (lbs): 123 lbs Muscle Mass (lbs): 123.4 lbs Total Body Water (lbs): 93.6 lbs Visceral Fat Rating : 16   Other Clinical Data RMR: [id number] Fasting: Yes Labs: Yes Today's Visit #: 65 Starting Date: 05/10/20    Chief Complaint:   OBESITY Regina Christensen is here to discuss her progress with her obesity treatment plan.  She is on the the Category 1 Plan and states she is following her eating plan approximately 70 % of the time.  She states she is exercising Seated Chair Exercises 10 minutes 3 times per week.  Interim History:  She was seen yesterday at [facility], mz:[id number] R hamstring Reviewed encounter notes, no imaging ordered. She reports reduced pain at present.  She denies any recent episodes of A Fib  She denies any recent episodes of hypogylcemia  Subjective:   1. OSA (obstructive sleep apnea) She has restarted nightly CPAP She estimates at least 4 hrs each night  2. Hypertension associated with type 2 diabetes mellitus (HCC) BP stable at OV She has not been checking home readings, cuff is too large Declined order for new cuff today  3. Type 2 diabetes mellitus with other specified complication, without long-term current use of insulin  (HCC) Last home fasting CBG, last week=107  PCP is managing weekly Ozempic  2mg  Cards is managing daily Jardiance  10mg  She denies any recent episodes of hypoglycemia Denies mass in neck, dysphagia, dyspepsia, persistent hoarseness, abdominal pain, or N/V/C   Lab Results  Component Value Date   HGBA1C 5.6 12/31/2023    HGBA1C 5.8 (H) 06/22/2023   HGBA1C 6.0 (H) 02/23/2023    4. Vitamin D  deficiency  Latest Reference Range & Units 02/23/23 08:42 06/22/23 08:40 12/31/23 08:38  Vitamin D , 25-Hydroxy 30.0 - 100.0 ng/mL 68.4 30.6 45.3   Vit D level stable  Assessment/Plan:   1. OSA (obstructive sleep apnea) Continue with weight loss efforts Continue with nightly CPAP  2. Hypertension associated with type 2 diabetes mellitus (HCC) (Primary) Check Labs - Comprehensive metabolic panel with GFR  3. Type 2 diabetes mellitus with other specified complication, without long-term current use of insulin  (HCC) Check Labs - Hemoglobin A1c  Patient was counseled on the importance of maintaining healthy lifestyle habits, including balanced nutrition, regular physical activity, and behavioral modifications, while taking antiobesity medication.   Patient verbalized understanding that medication is an adjunct to, not a replacement for, lifestyle changes and that the long-term success and weight maintenance depend on continued adherence to these strategies.   4. Vitamin D  deficiency Check Labs - VITAMIN D  25 Hydroxy (Vit-D Deficiency, Fractures)  5. Obesity, current BMI 38.5  [name] is currently in the action stage of change. As such, her goal is to continue with weight loss efforts. She has agreed to the Category 1 Plan.   Exercise goals: Older adults should follow the adult guidelines. When older adults cannot meet the adult guidelines, they should be as physically active as their abilities and conditions will allow.  Older adults should do exercises that maintain or improve balance if they are  at risk of falling.  Older adults should determine their level of effort for physical activity relative to their level of fitness.  Older adults with chronic conditions should understand whether and how their conditions affect their ability to do regular physical activity safely.  Behavioral modification strategies:  increasing lean protein intake, decreasing simple carbohydrates, increasing vegetables, increasing water intake, meal planning and cooking strategies, keeping healthy foods in the home, ways to avoid boredom eating, better snacking choices, and planning for success.  Tajuanna has agreed to follow-up with our clinic in 4 weeks. She was informed of the importance of frequent follow-up visits to maximize her success with intensive lifestyle modifications for her multiple health conditions.   [name] was informed we would discuss her lab results at her next visit unless there is a critical issue that needs to be addressed sooner. [name] agreed to keep her next visit at the agreed upon time to discuss these results.  Objective:   Blood pressure 138/89, pulse 80, temperature 97.7 F (36.5 C), height 5' 8 (1.727 m), weight 253 lb (114.8 kg), SpO2 100%. Body mass index is 38.47 kg/m.  General: Cooperative, alert, well developed, in no acute distress. HEENT: Conjunctivae and lids unremarkable. Cardiovascular: Regular rhythm.  Lungs: Normal work of breathing. Neurologic: No focal deficits.   Lab Results  Component Value Date   CREATININE 0.92 12/31/2023   BUN 15 12/31/2023   NA 140 12/31/2023   K 4.2 12/31/2023   CL 103 12/31/2023   CO2 21 12/31/2023   Lab Results  Component Value Date   ALT 12 12/31/2023   AST 14 12/31/2023   ALKPHOS 93 12/31/2023   BILITOT 0.4 12/31/2023   Lab Results  Component Value Date   HGBA1C 5.6 12/31/2023   HGBA1C 5.8 (H) 06/22/2023   HGBA1C 6.0 (H) 02/23/2023   HGBA1C 5.4 10/29/2022   HGBA1C 6.1 (A) 06/25/2022   Lab Results  Component Value Date   INSULIN  9.4 12/31/2023   INSULIN  10.1 06/22/2023   INSULIN  6.1 02/23/2023   INSULIN  8.0 08/28/2021   Lab Results  Component Value Date   TSH 1.359 03/02/2022   Lab Results  Component Value Date   CHOL 122 01/29/2024   HDL 81 01/29/2024   LDLCALC 28 01/29/2024   TRIG 51 01/29/2024   CHOLHDL  1.5 01/29/2024   Lab Results  Component Value Date   VD25OH 45.3 12/31/2023   VD25OH 30.6 06/22/2023   VD25OH 68.4 02/23/2023   Lab Results  Component Value Date   WBC 7.0 05/14/2022   HGB 14.6 05/14/2022   HCT 44.9 05/14/2022   MCV 89.6 05/14/2022   PLT 291 05/14/2022   Lab Results  Component Value Date   IRON 39 11/14/2020   TIBC 329 11/14/2020   FERRITIN 25 11/14/2020   Attestation Statements:   Reviewed by clinician on day of visit: allergies, medications, problem list, medical history, surgical history, family history, social history, and previous encounter notes.  I have reviewed the above documentation for accuracy and completeness, and I agree with the above. -  Joshia Kitchings d. Amarya Kuehl, NP-C "

## 2024-05-05 ENCOUNTER — Encounter (INDEPENDENT_AMBULATORY_CARE_PROVIDER_SITE_OTHER): Payer: Self-pay | Admitting: Physician Assistant

## 2024-05-05 ENCOUNTER — Encounter: Payer: Self-pay | Admitting: Family Medicine

## 2024-05-05 LAB — COMPREHENSIVE METABOLIC PANEL WITH GFR
ALT: 15 IU/L (ref 0–32)
AST: 14 IU/L (ref 0–40)
Albumin: 4.1 g/dL (ref 3.9–4.9)
Alkaline Phosphatase: 98 IU/L (ref 49–135)
BUN/Creatinine Ratio: 18 (ref 12–28)
BUN: 13 mg/dL (ref 8–27)
Bilirubin Total: 0.3 mg/dL (ref 0.0–1.2)
CO2: 20 mmol/L (ref 20–29)
Calcium: 9.7 mg/dL (ref 8.7–10.3)
Chloride: 104 mmol/L (ref 96–106)
Creatinine, Ser: 0.72 mg/dL (ref 0.57–1.00)
Globulin, Total: 2.6 g/dL (ref 1.5–4.5)
Glucose: 123 mg/dL — ABNORMAL HIGH (ref 70–99)
Potassium: 4.6 mmol/L (ref 3.5–5.2)
Sodium: 141 mmol/L (ref 134–144)
Total Protein: 6.7 g/dL (ref 6.0–8.5)
eGFR: 92 mL/min/1.73

## 2024-05-05 LAB — HEMOGLOBIN A1C
Est. average glucose Bld gHb Est-mCnc: 120 mg/dL
Hgb A1c MFr Bld: 5.8 % — ABNORMAL HIGH (ref 4.8–5.6)

## 2024-05-05 LAB — VITAMIN D 25 HYDROXY (VIT D DEFICIENCY, FRACTURES): Vit D, 25-Hydroxy: 38.4 ng/mL (ref 30.0–100.0)

## 2024-05-06 ENCOUNTER — Other Ambulatory Visit: Payer: Self-pay | Admitting: Family Medicine

## 2024-05-06 DIAGNOSIS — F411 Generalized anxiety disorder: Secondary | ICD-10-CM

## 2024-05-06 DIAGNOSIS — K219 Gastro-esophageal reflux disease without esophagitis: Secondary | ICD-10-CM

## 2024-05-06 DIAGNOSIS — Z8673 Personal history of transient ischemic attack (TIA), and cerebral infarction without residual deficits: Secondary | ICD-10-CM

## 2024-05-06 DIAGNOSIS — I5032 Chronic diastolic (congestive) heart failure: Secondary | ICD-10-CM

## 2024-05-06 DIAGNOSIS — F3342 Major depressive disorder, recurrent, in full remission: Secondary | ICD-10-CM

## 2024-05-06 NOTE — Telephone Encounter (Signed)
 Requested Prescriptions  Pending Prescriptions Disp Refills   pantoprazole  (PROTONIX ) 40 MG tablet [Pharmacy Med Name: PANTOPRAZOLE  DR 40MG  TAB 40 Tablet] 30 tablet 10    Sig: TAKE 1 TABLET BY MOUTH DAILY     Gastroenterology: Proton Pump Inhibitors Passed - 05/06/2024  3:28 PM      Passed - Valid encounter within last 12 months    Recent Outpatient Visits           3 weeks ago Acute maxillary sinusitis, recurrence not specified   [facility] [place], Laymon SAILOR, FNP   3 months ago Controlled type 2 diabetes mellitus with microalbuminuria, without long-term current use of insulin  [facility])   [facility] Glenard Mire, MD   7 months ago Acute non-recurrent frontal sinusitis   [facility] Gareth Clarity F, FNP   10 months ago Chronic systolic heart failure [facility])    [facility] Glenard Mire, MD       Future Appointments             In 3 months McGowan, Clotilda LABOR, PA-C [facility] [place]             venlafaxine  XR (EFFEXOR -XR) 75 MG 24 hr capsule [Pharmacy Med Name: VENLAFAXINE  ER 75MG  CAP 75 Capsule] 30 capsule 10    Sig: TAKE 1 CAPSULE (75 MG TOTAL) BY MOUTH EVERY MORNING.     Psychiatry: Antidepressants - SNRI - desvenlafaxine & venlafaxine  Failed - 05/06/2024  3:28 PM      Failed - Lipid Panel in normal range within the last 12 months    Cholesterol, Total  Date Value Ref Range Status  12/05/2014 151 100 - 199 mg/dL Final   Cholesterol  Date Value Ref Range Status  01/29/2024 122 <200 mg/dL Final   LDL Cholesterol (Calc)  Date Value Ref Range Status  01/29/2024 28 mg/dL (calc) Final    Comment:    Reference range: <100 . Desirable range <100 mg/dL for primary prevention;   <70 mg/dL for patients with CHD or diabetic patients  with > or = 2 CHD risk factors. SABRA LDL-C is now calculated using the Martin-Hopkins  calculation, which is a validated novel  method providing  better accuracy than the Friedewald equation in the  estimation of LDL-C.  Gladis APPLETHWAITE et al. SANDREA. 7986;689(80): 2061-2068  (http://education.[facility].com/faq/FAQ164)    HDL  Date Value Ref Range Status  01/29/2024 81 > OR = 50 mg/dL Final  91/83/7983 74 >60 mg/dL Final    Comment:    According to ATP-III Guidelines, HDL-C >59 mg/dL is considered a negative risk factor for CHD.    Triglycerides  Date Value Ref Range Status  01/29/2024 51 <150 mg/dL Final         Passed - Cr in normal range and within 360 days    Creat  Date Value Ref Range Status  03/10/2022 0.66 0.50 - 1.05 mg/dL Final   Creatinine, Ser  Date Value Ref Range Status  05/04/2024 0.72 0.57 - 1.00 mg/dL Final   Creatinine, Urine  Date Value Ref Range Status  01/29/2024 269 20 - 275 mg/dL Final         Passed - Completed PHQ-2 or PHQ-9 in the last 360 days      Passed - Last BP in normal range    BP Readings from Last 1 Encounters:  05/04/24 138/89         Passed - Valid encounter  within last 6 months    Recent Outpatient Visits           3 weeks ago Acute maxillary sinusitis, recurrence not specified   [facility], Laymon SAILOR, FNP   3 months ago Controlled type 2 diabetes mellitus with microalbuminuria, without long-term current use of insulin  [facility])   [place] [facility] Glenard Mire, MD   7 months ago Acute non-recurrent frontal sinusitis   [facility] Gareth Clarity F, FNP   10 months ago Chronic systolic heart failure [facility])   [place] [facility] Glenard Mire, MD       Future Appointments             In 3 months McGowan, Clotilda LABOR, PA-C [facility] [place]             famotidine  (PEPCID ) 20 MG tablet [Pharmacy Med Name: FAMOTIDINE  20 MG TAB 20 Tablet] 30 tablet 10    Sig: TAKE 1 TABLET BY MOUTH DAILY AS NEEDED INDIGESTION     Gastroenterology:   H2 Antagonists Passed - 05/06/2024  3:28 PM      Passed - Valid encounter within last 12 months    Recent Outpatient Visits           3 weeks ago Acute maxillary sinusitis, recurrence not specified   [facility], Laymon SAILOR, FNP   3 months ago Controlled type 2 diabetes mellitus with microalbuminuria, without long-term current use of insulin  [facility])   [facility] Glenard Mire, MD   7 months ago Acute non-recurrent frontal sinusitis   [facility] Gareth Clarity F, FNP   10 months ago Chronic systolic heart failure [facility])   [place] [facility] Glenard Mire, MD       Future Appointments             In 3 months McGowan, Clotilda LABOR, PA-C [facility] [place]             atorvastatin  (LIPITOR) 10 MG tablet [Pharmacy Med Name: ATORVASTATIN  10MG  TAB 10 Tablet] 30 tablet 10    Sig: TAKE 1 TABLET BY MOUTH DAILY     Cardiovascular:  Antilipid - Statins Failed - 05/06/2024  3:28 PM      Failed - Lipid Panel in normal range within the last 12 months    Cholesterol, Total  Date Value Ref Range Status  12/05/2014 151 100 - 199 mg/dL Final   Cholesterol  Date Value Ref Range Status  01/29/2024 122 <200 mg/dL Final   LDL Cholesterol (Calc)  Date Value Ref Range Status  01/29/2024 28 mg/dL (calc) Final    Comment:    Reference range: <100 . Desirable range <100 mg/dL for primary prevention;   <70 mg/dL for patients with CHD or diabetic patients  with > or = 2 CHD risk factors. SABRA LDL-C is now calculated using the Martin-Hopkins  calculation, which is a validated novel method providing  better accuracy than the Friedewald equation in the  estimation of LDL-C.  Gladis APPLETHWAITE et al. SANDREA. 7986;689(80): 2061-2068  (http://education.[facility].com/faq/FAQ164)    HDL  Date Value Ref Range Status  01/29/2024 81 > OR = 50 mg/dL Final  91/83/7983 74 >60 mg/dL Final     Comment:    According to ATP-III Guidelines, HDL-C >59 mg/dL is considered a negative risk factor for CHD.    Triglycerides  Date Value  Ref Range Status  01/29/2024 51 <150 mg/dL Final         Passed - Patient is not pregnant      Passed - Valid encounter within last 12 months    Recent Outpatient Visits           3 weeks ago Acute maxillary sinusitis, recurrence not specified   [place] [facility] [place], Laymon SAILOR, FNP   3 months ago Controlled type 2 diabetes mellitus with microalbuminuria, without long-term current use of insulin  [facility])   [facility] Glenard Mire, MD   7 months ago Acute non-recurrent frontal sinusitis   [facility] Gareth Mliss FALCON, FNP   10 months ago Chronic systolic heart failure [facility])   [place] [facility] Glenard Mire, MD       Future Appointments             In 3 months McGowan, Clotilda LABOR, PA-C [facility]              furosemide  (LASIX ) 40 MG tablet [Pharmacy Med Name: FUROSEMIDE  40 MG TABS 40 Tablet] 90 tablet 10    Sig: TAKE 1 TABLET BY MOUTH ONCE DAILY     Cardiovascular:  Diuretics - Loop Failed - 05/06/2024  3:28 PM      Failed - Mg Level in normal range and within 180 days    Magnesium   Date Value Ref Range Status  03/02/2022 2.0 1.7 - 2.4 mg/dL Final    Comment:    Performed at [facility], [address] [place], [place] [address]         Passed - K in normal range and within 180 days    Potassium  Date Value Ref Range Status  05/04/2024 4.6 3.5 - 5.2 mmol/L Final  07/10/2012 3.5 3.5 - 5.1 mmol/L Final         Passed - Ca in normal range and within 180 days    Calcium   Date Value Ref Range Status  05/04/2024 9.7 8.7 - 10.3 mg/dL Final   Calcium , Total  Date Value Ref Range Status  07/10/2012 9.1 8.5 - 10.1 mg/dL Final         Passed - Na in normal range and within 180 days    Sodium  Date  Value Ref Range Status  05/04/2024 141 134 - 144 mmol/L Final  07/10/2012 140 136 - 145 mmol/L Final         Passed - Cr in normal range and within 180 days    Creat  Date Value Ref Range Status  03/10/2022 0.66 0.50 - 1.05 mg/dL Final   Creatinine, Ser  Date Value Ref Range Status  05/04/2024 0.72 0.57 - 1.00 mg/dL Final   Creatinine, Urine  Date Value Ref Range Status  01/29/2024 269 20 - 275 mg/dL Final         Passed - Cl in normal range and within 180 days    Chloride  Date Value Ref Range Status  05/04/2024 104 96 - 106 mmol/L Final  07/10/2012 104 98 - 107 mmol/L Final         Passed - Last BP in normal range    BP Readings from Last 1 Encounters:  05/04/24 138/89         Passed - Valid encounter within last 6 months    Recent Outpatient Visits           3 weeks  ago Acute maxillary sinusitis, recurrence not specified   [facility], Laymon SAILOR, FNP   3 months ago Controlled type 2 diabetes mellitus with microalbuminuria, without long-term current use of insulin  [facility])   [place] [facility] Glenard Mire, MD   7 months ago Acute non-recurrent frontal sinusitis   [facility] Gareth Mliss FALCON, FNP   10 months ago Chronic systolic heart failure [facility])    [facility] Glenard Mire, MD       Future Appointments             In 3 months McGowan, Clotilda DELENA RIGGERS [facility] [place]

## 2024-05-08 ENCOUNTER — Other Ambulatory Visit: Payer: Self-pay | Admitting: Family Medicine

## 2024-05-08 DIAGNOSIS — H8110 Benign paroxysmal vertigo, unspecified ear: Secondary | ICD-10-CM

## 2024-05-08 MED ORDER — MECLIZINE HCL 12.5 MG PO TABS: 12.5000 mg | ORAL_TABLET | Freq: Three times a day (TID) | ORAL | 0 refills | Status: AC | PRN

## 2024-05-10 ENCOUNTER — Ambulatory Visit: Payer: Medicare (Managed Care)

## 2024-05-20 ENCOUNTER — Encounter: Payer: Self-pay | Admitting: Family Medicine

## 2024-05-20 ENCOUNTER — Ambulatory Visit: Payer: Medicare (Managed Care) | Admitting: Family Medicine

## 2024-05-20 VITALS — BP 114/74 | HR 77 | Resp 16 | Ht 68.0 in | Wt 257.8 lb

## 2024-05-20 DIAGNOSIS — I420 Dilated cardiomyopathy: Secondary | ICD-10-CM | POA: Diagnosis not present

## 2024-05-20 DIAGNOSIS — F3342 Major depressive disorder, recurrent, in full remission: Secondary | ICD-10-CM | POA: Diagnosis not present

## 2024-05-20 DIAGNOSIS — E119 Type 2 diabetes mellitus without complications: Secondary | ICD-10-CM | POA: Insufficient documentation

## 2024-05-20 DIAGNOSIS — E1129 Type 2 diabetes mellitus with other diabetic kidney complication: Secondary | ICD-10-CM | POA: Diagnosis not present

## 2024-05-20 DIAGNOSIS — Z7984 Long term (current) use of oral hypoglycemic drugs: Secondary | ICD-10-CM | POA: Diagnosis not present

## 2024-05-20 DIAGNOSIS — I10 Essential (primary) hypertension: Secondary | ICD-10-CM | POA: Diagnosis not present

## 2024-05-20 DIAGNOSIS — G4733 Obstructive sleep apnea (adult) (pediatric): Secondary | ICD-10-CM | POA: Diagnosis not present

## 2024-05-20 DIAGNOSIS — I5032 Chronic diastolic (congestive) heart failure: Secondary | ICD-10-CM | POA: Diagnosis not present

## 2024-05-20 DIAGNOSIS — E669 Obesity, unspecified: Secondary | ICD-10-CM | POA: Diagnosis not present

## 2024-05-20 DIAGNOSIS — E559 Vitamin D deficiency, unspecified: Secondary | ICD-10-CM

## 2024-05-20 DIAGNOSIS — M6289 Other specified disorders of muscle: Secondary | ICD-10-CM | POA: Diagnosis not present

## 2024-05-20 DIAGNOSIS — I480 Paroxysmal atrial fibrillation: Secondary | ICD-10-CM | POA: Diagnosis not present

## 2024-05-20 DIAGNOSIS — R809 Proteinuria, unspecified: Secondary | ICD-10-CM

## 2024-05-20 MED ORDER — BACLOFEN 10 MG PO TABS: 10.0000 mg | ORAL_TABLET | Freq: Three times a day (TID) | ORAL | 0 refills | Status: AC

## 2024-05-20 MED ORDER — VITAMIN D (ERGOCALCIFEROL) 1.25 MG (50000 UNIT) PO CAPS: 50000.0000 [IU] | ORAL_CAPSULE | ORAL | 0 refills | Status: AC

## 2024-05-20 MED ORDER — POTASSIUM CHLORIDE CRYS ER 10 MEQ PO TBCR: 10.0000 meq | EXTENDED_RELEASE_TABLET | Freq: Every day | ORAL | 1 refills | Status: AC

## 2024-05-30 ENCOUNTER — Other Ambulatory Visit: Payer: Medicare (Managed Care)

## 2024-06-01 ENCOUNTER — Ambulatory Visit (INDEPENDENT_AMBULATORY_CARE_PROVIDER_SITE_OTHER): Payer: Medicare (Managed Care) | Admitting: Adult Health

## 2024-06-29 ENCOUNTER — Ambulatory Visit (INDEPENDENT_AMBULATORY_CARE_PROVIDER_SITE_OTHER): Payer: Medicare (Managed Care) | Admitting: Adult Health

## 2024-08-12 ENCOUNTER — Ambulatory Visit: Payer: Medicare (Managed Care)

## 2024-08-19 ENCOUNTER — Ambulatory Visit: Payer: Medicare (Managed Care)

## 2024-08-24 ENCOUNTER — Ambulatory Visit: Payer: Medicare (Managed Care) | Admitting: Urology

## 2024-09-19 ENCOUNTER — Ambulatory Visit: Payer: Medicare (Managed Care) | Admitting: Family Medicine
